# Patient Record
Sex: Male | Born: 1945 | Race: White | Hispanic: No | Marital: Married | Smoking: Former smoker
Health system: Southern US, Community
[De-identification: ages and names within clinical notes are randomized; demographics above are authoritative.]

## PROBLEM LIST (undated history)

## (undated) DIAGNOSIS — I779 Disorder of arteries and arterioles, unspecified: Secondary | ICD-10-CM

## (undated) DIAGNOSIS — I2510 Atherosclerotic heart disease of native coronary artery without angina pectoris: Secondary | ICD-10-CM

## (undated) DIAGNOSIS — I700 Atherosclerosis of aorta: Secondary | ICD-10-CM

## (undated) DIAGNOSIS — R739 Hyperglycemia, unspecified: Secondary | ICD-10-CM

## (undated) DIAGNOSIS — K429 Umbilical hernia without obstruction or gangrene: Secondary | ICD-10-CM

## (undated) DIAGNOSIS — I639 Cerebral infarction, unspecified: Secondary | ICD-10-CM

## (undated) DIAGNOSIS — Z7739 Contact with and (suspected) exposure to other war theater: Secondary | ICD-10-CM

## (undated) DIAGNOSIS — G4730 Sleep apnea, unspecified: Secondary | ICD-10-CM

## (undated) DIAGNOSIS — Z7982 Long term (current) use of aspirin: Secondary | ICD-10-CM

## (undated) DIAGNOSIS — I5189 Other ill-defined heart diseases: Secondary | ICD-10-CM

## (undated) DIAGNOSIS — M1990 Unspecified osteoarthritis, unspecified site: Secondary | ICD-10-CM

## (undated) DIAGNOSIS — I10 Essential (primary) hypertension: Secondary | ICD-10-CM

## (undated) DIAGNOSIS — E039 Hypothyroidism, unspecified: Secondary | ICD-10-CM

## (undated) DIAGNOSIS — I219 Acute myocardial infarction, unspecified: Secondary | ICD-10-CM

## (undated) DIAGNOSIS — E785 Hyperlipidemia, unspecified: Secondary | ICD-10-CM

## (undated) DIAGNOSIS — R7303 Prediabetes: Secondary | ICD-10-CM

## (undated) HISTORY — DX: Atherosclerotic heart disease of native coronary artery without angina pectoris: I25.10

## (undated) HISTORY — DX: Other ill-defined heart diseases: I51.89

## (undated) HISTORY — DX: Morbid (severe) obesity due to excess calories: E66.01

## (undated) HISTORY — PX: ROTATOR CUFF REPAIR: SHX139

---

## 2008-11-17 ENCOUNTER — Ambulatory Visit (HOSPITAL_COMMUNITY): Admission: RE | Admit: 2008-11-17 | Discharge: 2008-11-18 | Payer: Self-pay | Admitting: Orthopedic Surgery

## 2008-12-22 ENCOUNTER — Ambulatory Visit (HOSPITAL_BASED_OUTPATIENT_CLINIC_OR_DEPARTMENT_OTHER): Admission: RE | Admit: 2008-12-22 | Discharge: 2008-12-22 | Payer: Self-pay | Admitting: Orthopedic Surgery

## 2009-10-04 ENCOUNTER — Emergency Department: Payer: Self-pay

## 2009-10-16 ENCOUNTER — Ambulatory Visit: Payer: Self-pay | Admitting: Urology

## 2010-11-27 LAB — POCT HEMOGLOBIN-HEMACUE: Hemoglobin: 14.5 g/dL (ref 13.0–17.0)

## 2010-11-29 LAB — COMPREHENSIVE METABOLIC PANEL
ALT: 50 U/L (ref 0–53)
Alkaline Phosphatase: 130 U/L — ABNORMAL HIGH (ref 39–117)
CO2: 23 mEq/L (ref 19–32)
Glucose, Bld: 80 mg/dL (ref 70–99)
Potassium: 4.4 mEq/L (ref 3.5–5.1)
Sodium: 136 mEq/L (ref 135–145)
Total Protein: 6.8 g/dL (ref 6.0–8.3)

## 2010-11-29 LAB — URINALYSIS, ROUTINE W REFLEX MICROSCOPIC
Bilirubin Urine: NEGATIVE
Glucose, UA: NEGATIVE mg/dL
Hgb urine dipstick: NEGATIVE
Specific Gravity, Urine: 1.025 (ref 1.005–1.030)
Urobilinogen, UA: 1 mg/dL (ref 0.0–1.0)
pH: 6 (ref 5.0–8.0)

## 2010-11-29 LAB — DIFFERENTIAL
Basophils Relative: 0 % (ref 0–1)
Eosinophils Absolute: 0.1 10*3/uL (ref 0.0–0.7)
Eosinophils Relative: 2 % (ref 0–5)
Monocytes Relative: 7 % (ref 3–12)
Neutrophils Relative %: 55 % (ref 43–77)

## 2010-11-29 LAB — URINE CULTURE
Colony Count: NO GROWTH
Culture: NO GROWTH

## 2010-11-29 LAB — CBC
Hemoglobin: 14.1 g/dL (ref 13.0–17.0)
RBC: 4.81 MIL/uL (ref 4.22–5.81)

## 2010-11-29 LAB — PROTIME-INR: INR: 0.9 (ref 0.00–1.49)

## 2011-01-01 NOTE — Op Note (Signed)
NAME:  Alan Stewart, Alan Stewart NO.:  [id number]   MEDICAL RECORD NO.:  [id number]          PATIENT TYPE:  OIB   LOCATION:  5003                         FACILITY:  [facility]   PHYSICIAN:  Rodney A. 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  He has significant pain and  discomfort about this shoulder.  Because of persistent pain and  discomfort, an MRI of the shoulder was done, and this shows a full-  thickness tear at distal insertion of the supraspinatus.  Tear measures  1.6 cm on the sagittal image.  There is some retraction of the bursal  fibers in this area.  There is severe diffuse tendinopathy of the  supraspinatus area in the area of the tear.  There is severe arthritis  of the [facility] joint with unfavorable acromial anatomy, and he is now  admitted for surgical repair.  Questions answered and encouraged  preoperatively.  Complications discussed.   JUSTIFICATION FOR OUTPATIENT SURGERY:  Minimal morbidity.   PREOPERATIVE DIAGNOSES:  Full-thickness tear supraspinatus right  shoulder; impingement syndrome; severe arthritis right acromioclavicular  joint.   POSTOPERATIVE DIAGNOSES:  Full-thickness tear supraspinatus right  shoulder; impingement syndrome; severe arthritis right acromioclavicular  joint.   OPERATION:  Right shoulder arthroscopy, open acromioplasty and open  excisional repair of torn rotator cuff on the right; distal clavicle  resection.   SURGEON:  Lenard Galloway. Chaney Malling, MD   ASSISTANT:  Laural Benes. Su Hilt, [place]   ANESTHESIA:  General.   PROCEDURE:  The patient was placed on the operating table in the supine  position.  After satisfactory general anesthesia, the patient was placed  semi-sitting position.  The right shoulder and upper extremity  was  prepped and draped out in the usual manner.  An arthroscope was  introduced through the posterior portal and an intraoperative portal was  made.  Very careful examination of the shoulder was undertaken.  The  glenoid and the humeral head showed normal articular cartilage.  The  biceps was normal.  The labrum was intact.  There was marked fraying and  tearing of the undersurface of the rotator cuff just behind the biceps.  Once this was accomplished, the arthroscope was then removed.   It was felt that an open repair was indicated and necessary in this  patient.  A saber-cut incision was made over the anterolateral aspect of  the shoulder.  The skin edges were retracted.  A self-retaining  retractor was put in place.  The scope was placed back in the  glenohumeral joint, and an 18-gauge spinal needle was put through the  area of the tear from outside to in after the skin was opened.  This was  certainly the degenerated torn portion of the rotator cuff  _then_________ the arthroscope was removed.  An elliptical excision of  that portion cup was then made with a 15 blade.  Marked degenerative  gentle tissue was seen and this was totally excised.  A side-to-side  repair was then accomplished very nicely with 0 Ethibond sutures.  A  very tight stable repair was achieved.  Just prior to the repair, an  acromioplasty was done, and this was fairly generous as this had  inferior bone spurs and was tearing up the cuff.  There was a fair  amount of tendinopathy in that area.  Severe degenerative changes were  noted about the [facility] joint, and dissection was carried proximally and the  [facility] joint identified.  The capsule was opened, and a small saw was used  to resect the distal 7-8 mm of the distal clavicle.  The ligaments in  the surface of the clavicle were preserved, and there was good stability  of the distal clavicle.  Bleeders were coagulated throughout the  procedure.  Wounds were irrigated  multiple times with saline.  The  deltoid had been split longitudinally along its fibers and was  reattached with 0 Vicryl and 0 Vicryl was used to close the subcutaneous  tissue.  Stainless steel staples were used to close the skin.  Sterile  dressings were applied.  Marcaine was placed in the wound, and the  patient was returned to recovery room in excellent condition.  Technically, this went extremely well.  I was very pleased with the  surgical construct.   DISPOSITION:  1. The patient will be observed overnight.  2. Discharge in a.m.  3. Usual postop instructions given.  4. To my office next week to be followed up.       Rodney A. Chaney Malling, M.D.  Electronically Signed     RAM/MEDQ  D:  11/17/2008  T:  11/18/2008  Job:  [id number]

## 2011-01-01 NOTE — Op Note (Signed)
NAME:  Alan Stewart, Alan Stewart NO.:  [id number]   MEDICAL RECORD NO.:  [id number]          PATIENT TYPE:  AMB   LOCATION:  DSC                          FACILITY:  [facility]   PHYSICIAN:  Rodney A. Mortenson, M.D.DATE OF BIRTH:  [birth year]   DATE OF PROCEDURE:  DATE OF DISCHARGE:                               OPERATIVE REPORT   JUSTIFICATION:  A 65 year old male who had previous surgery in the right  shoulder, has developed adhesive capsulitis and loss of motion about the  shoulder.  He is now admitted for closed manipulation.  Complication was  discussed.  Questions answered and encouraged.   JUSTIFICATION FOR OUTPATIENT SURGERY:  Minimal morbidity.   PREOPERATIVE DIAGNOSIS:  Postoperative adhesive capsulitis right  shoulder.   POSTOPERATIVE DIAGNOSIS:  Postoperative adhesive capsulitis right  shoulder.   OPERATION:  Closed manipulation right shoulder under general anesthesia.   SURGEON:  Lenard Galloway. Mortenson, MD   PROCEDURE:  The patient was placed on the operating table in the supine  position.  After satisfactory general anesthesia, the scapula was  stabilized.  The shoulder was abducted and externally and internally  rotated with lysis of adhesions.  This was done very successfully.  A  good range of motion was achieved.  The adhesions were not heavy and  dense but they easily lysed.  Marked increased range of motion following  manipulation.  Preoperatively, abduction about 80 degrees, external  rotation about 10 degrees and 15 degrees.  Postmanipulation abduction  about 100 degrees, external rotation 90 degrees, internal rotation 90  degrees.  No complications.  The patient returned to recovery room in  good condition.   DISPOSITION:  1. Start physical therapy tomorrow.  2. Overhead traction at night.  3. Percocet.  4. Usual postop instructions.  5. To my office on Wednesday.      Rodney A. Chaney Malling, M.D.  Electronically Signed     RAM/MEDQ  D:   12/22/2008  T:  12/22/2008  Job:  [id number]

## 2011-06-30 IMAGING — CT CT STONE STUDY
1 of 2 series · 15 of 32 positions shown, 19 images · non-contrast
Comparison: none

REASON FOR EXAM: left flank pain with hematuria
COMMENTS:   LMP: (Male)

PROCEDURE:     CT  - CT ABDOMEN /PELVIS WO (STONE)  - October 04, 2009  [DATE]
RESULT:     CT head and pelvis dated 10/04/2009.
TECHNIQUE: Helical 3 mm sections were obtained from the lung bases through
the pubic symphysis without administration of oral nor intravenous contrast.

[Series 2: stone · axial · 0.85mm/px · z∈[-556,-121]mm · 15 of 159 slices shown, 19 images]
[im 7/159  soft-tissue]
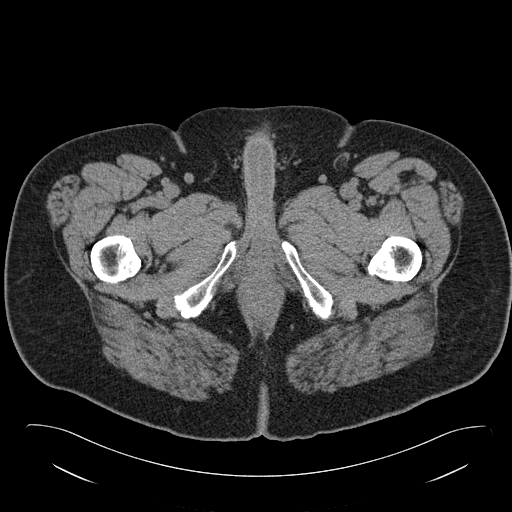
[im 7/159  bone]
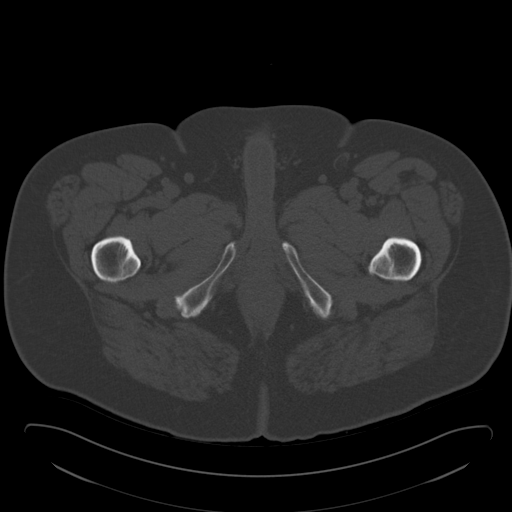
[im 19/159  soft-tissue]
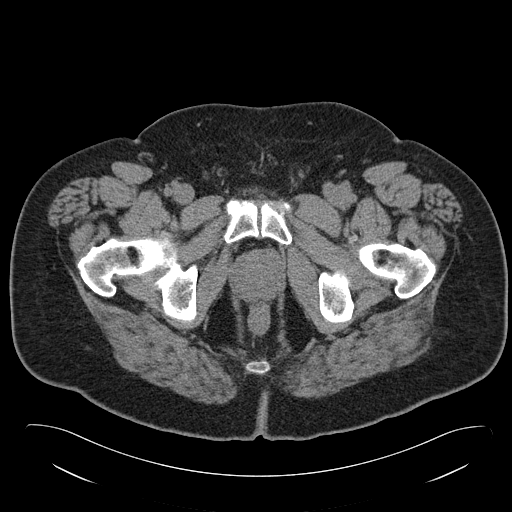
[im 31/159  soft-tissue]
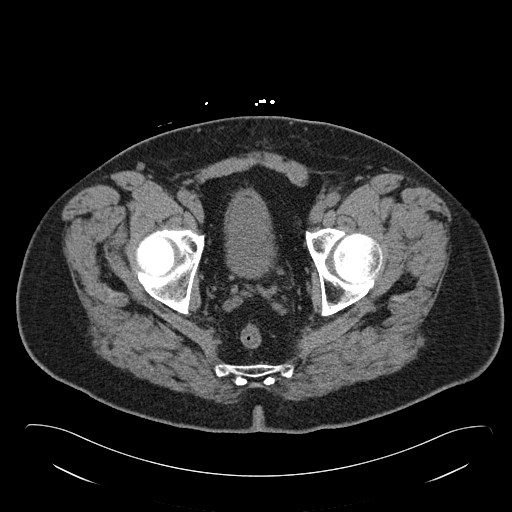
[im 43/159  soft-tissue]
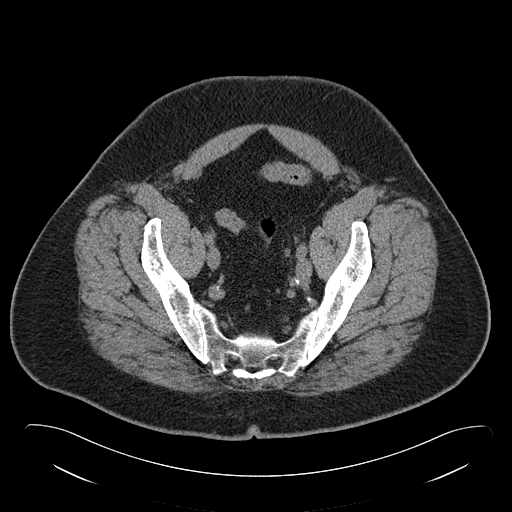
[im 55/159  soft-tissue]
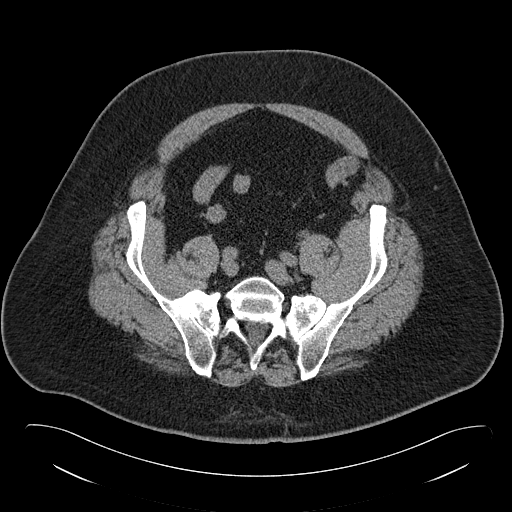
[im 67/159  soft-tissue]
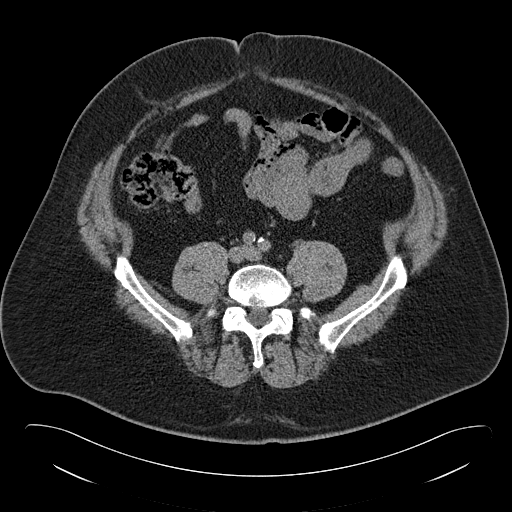
[im 80/159  soft-tissue]
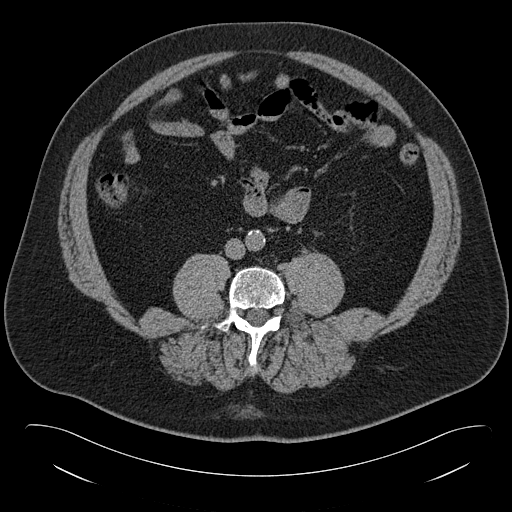
[im 92/159  soft-tissue]
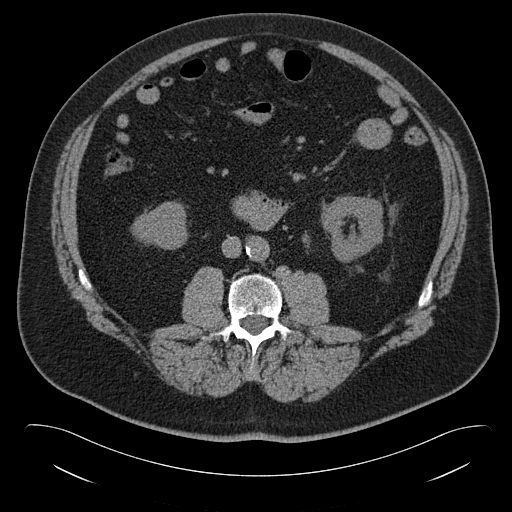
[im 104/159  soft-tissue]
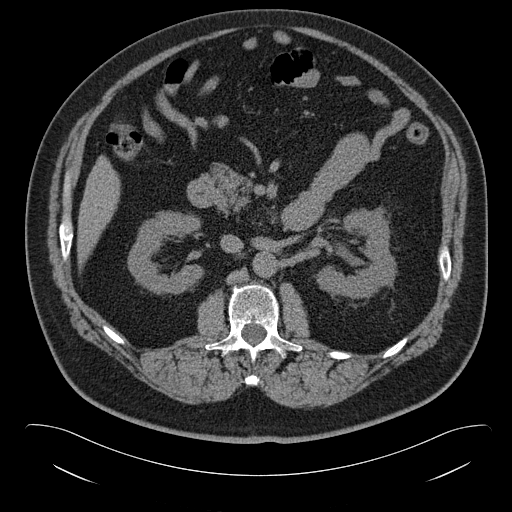
[im 104/159  bone]
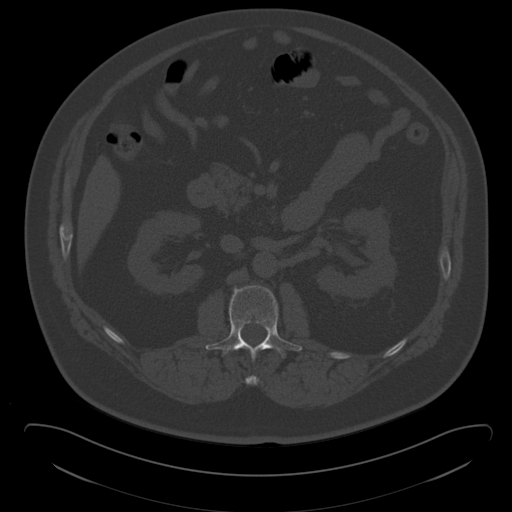
[im 116/159  soft-tissue]
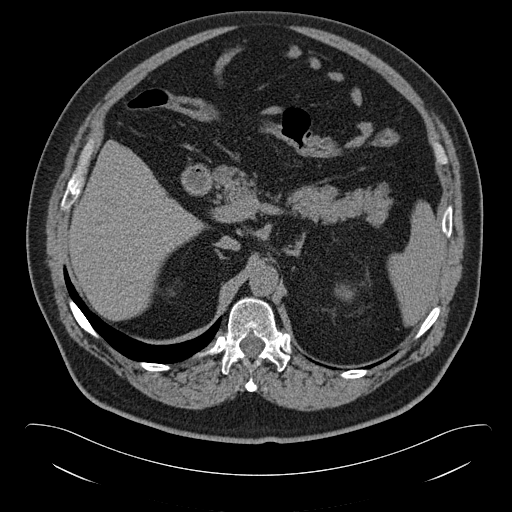
[im 128/159  soft-tissue]
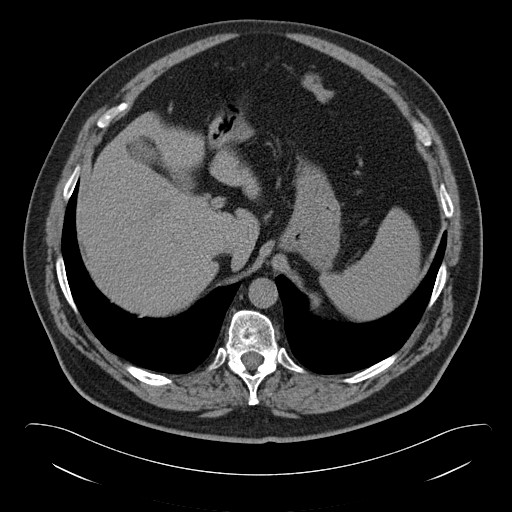
[im 134/159  lung]
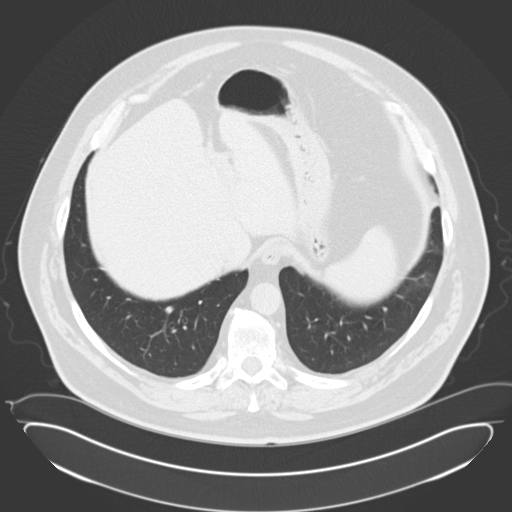
[im 140/159  soft-tissue]
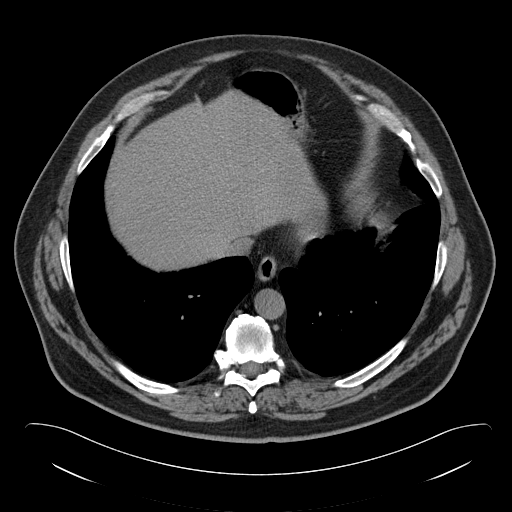
[im 140/159  lung]
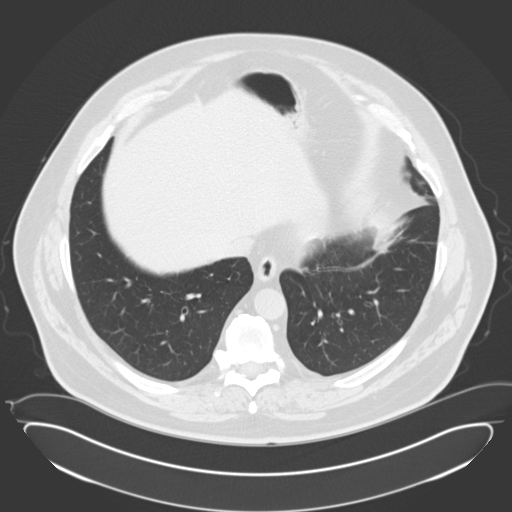
[im 146/159  lung]
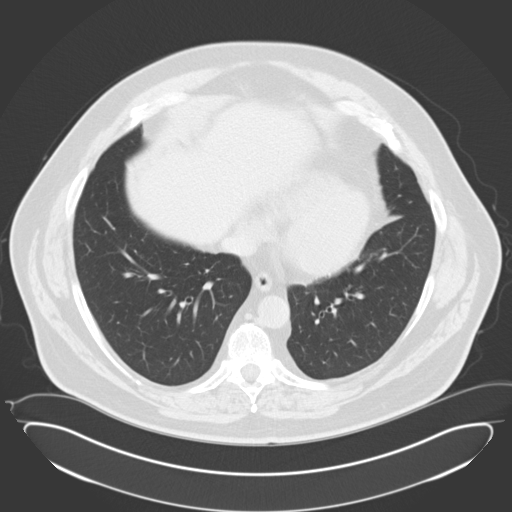
[im 152/159  soft-tissue]
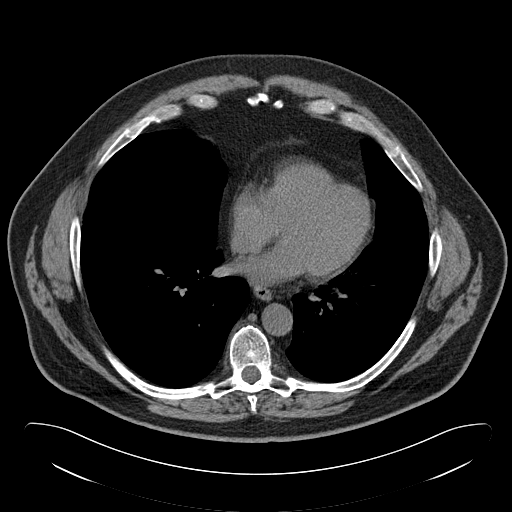
[im 152/159  lung]
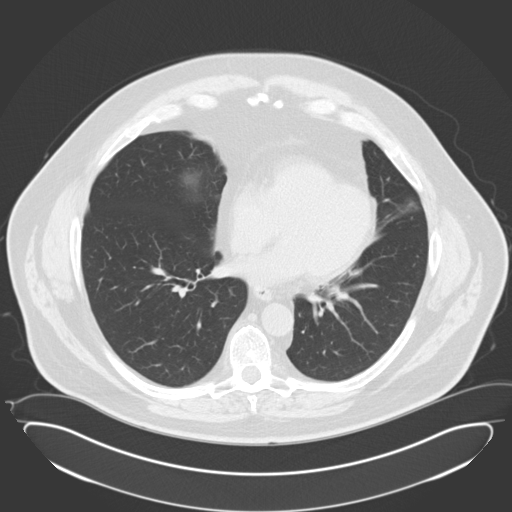

[15 of 32 positions shown; findings below may reference images not displayed]

FINDINGS: Evaluation of the lung bases demonstrates no gross abnormalities.

The left kidney demonstrates mild pelviectasis without evidence of
hydronephrosis. There is mild stranding within the perinephric fat. A
nonobstructing 2.6 mm medullary calculus is identified within the anterior
midpole region. There is mild to moderate prominence of the ureter. Within
the distal left ureter 4.1 mm calculus is appreciated.

Noncontrast evaluation of the liver, spleen, adrenals, pancreas, right
kidney is unremarkable. There is no CT evidence of bowel obstruction,
abdominal or pelvic free fluid, loculated fluid collections, masses or
adenopathy. Within the limitations of a noncontrast CT there is no evidence
of enteritis, diverticulitis, colitis, nor appendicitis. There is no
evidence of abdominal aortic aneurysm.
IMPRESSION: Distal left ureteral calculus with associated mild
obstructive uropathy. There is also evidence of a nonobstructing left
medullary calculus.

## 2015-07-20 DIAGNOSIS — M159 Polyosteoarthritis, unspecified: Secondary | ICD-10-CM | POA: Insufficient documentation

## 2015-07-20 DIAGNOSIS — Z125 Encounter for screening for malignant neoplasm of prostate: Secondary | ICD-10-CM | POA: Diagnosis not present

## 2015-07-20 DIAGNOSIS — G4733 Obstructive sleep apnea (adult) (pediatric): Secondary | ICD-10-CM | POA: Diagnosis not present

## 2015-07-20 DIAGNOSIS — I10 Essential (primary) hypertension: Secondary | ICD-10-CM | POA: Diagnosis not present

## 2015-07-20 DIAGNOSIS — Z0000 Encounter for general adult medical examination without abnormal findings: Secondary | ICD-10-CM | POA: Diagnosis not present

## 2015-07-20 DIAGNOSIS — R739 Hyperglycemia, unspecified: Secondary | ICD-10-CM | POA: Diagnosis not present

## 2015-07-20 DIAGNOSIS — E784 Other hyperlipidemia: Secondary | ICD-10-CM | POA: Diagnosis not present

## 2015-07-20 DIAGNOSIS — Z6841 Body Mass Index (BMI) 40.0 and over, adult: Secondary | ICD-10-CM | POA: Diagnosis not present

## 2015-08-07 DIAGNOSIS — Z1211 Encounter for screening for malignant neoplasm of colon: Secondary | ICD-10-CM | POA: Diagnosis not present

## 2015-09-26 DIAGNOSIS — E7800 Pure hypercholesterolemia, unspecified: Secondary | ICD-10-CM | POA: Diagnosis not present

## 2015-10-02 DIAGNOSIS — R739 Hyperglycemia, unspecified: Secondary | ICD-10-CM | POA: Diagnosis not present

## 2015-10-02 DIAGNOSIS — I10 Essential (primary) hypertension: Secondary | ICD-10-CM | POA: Diagnosis not present

## 2015-10-02 DIAGNOSIS — M159 Polyosteoarthritis, unspecified: Secondary | ICD-10-CM | POA: Diagnosis not present

## 2015-10-02 DIAGNOSIS — E784 Other hyperlipidemia: Secondary | ICD-10-CM | POA: Diagnosis not present

## 2015-10-02 DIAGNOSIS — Z6841 Body Mass Index (BMI) 40.0 and over, adult: Secondary | ICD-10-CM | POA: Diagnosis not present

## 2015-10-02 DIAGNOSIS — G4733 Obstructive sleep apnea (adult) (pediatric): Secondary | ICD-10-CM | POA: Diagnosis not present

## 2015-10-06 ENCOUNTER — Encounter: Payer: Self-pay | Admitting: *Deleted

## 2015-10-09 ENCOUNTER — Encounter: Payer: Self-pay | Admitting: Anesthesiology

## 2015-10-09 ENCOUNTER — Ambulatory Visit: Payer: Commercial Managed Care - HMO | Admitting: Anesthesiology

## 2015-10-09 ENCOUNTER — Encounter
Admission: RE | Disposition: A | Discharge status: Home / Self Care | Payer: Self-pay | Source: Ambulatory Visit | Attending: Gastroenterology

## 2015-10-09 ENCOUNTER — Ambulatory Visit
Admission: RE | Admit: 2015-10-09 | Discharge: 2015-10-09 | Disposition: A | Discharge status: Home / Self Care | Payer: Commercial Managed Care - HMO | Source: Ambulatory Visit | Attending: Gastroenterology | Admitting: Gastroenterology

## 2015-10-09 DIAGNOSIS — Z87891 Personal history of nicotine dependence: Secondary | ICD-10-CM | POA: Diagnosis not present

## 2015-10-09 DIAGNOSIS — I10 Essential (primary) hypertension: Secondary | ICD-10-CM | POA: Insufficient documentation

## 2015-10-09 DIAGNOSIS — M1990 Unspecified osteoarthritis, unspecified site: Secondary | ICD-10-CM | POA: Insufficient documentation

## 2015-10-09 DIAGNOSIS — E785 Hyperlipidemia, unspecified: Secondary | ICD-10-CM | POA: Diagnosis not present

## 2015-10-09 DIAGNOSIS — E119 Type 2 diabetes mellitus without complications: Secondary | ICD-10-CM | POA: Insufficient documentation

## 2015-10-09 DIAGNOSIS — K5730 Diverticulosis of large intestine without perforation or abscess without bleeding: Secondary | ICD-10-CM | POA: Insufficient documentation

## 2015-10-09 DIAGNOSIS — Z6837 Body mass index (BMI) 37.0-37.9, adult: Secondary | ICD-10-CM | POA: Diagnosis not present

## 2015-10-09 DIAGNOSIS — Z1211 Encounter for screening for malignant neoplasm of colon: Secondary | ICD-10-CM | POA: Diagnosis not present

## 2015-10-09 DIAGNOSIS — G4730 Sleep apnea, unspecified: Secondary | ICD-10-CM | POA: Diagnosis not present

## 2015-10-09 DIAGNOSIS — K5790 Diverticulosis of intestine, part unspecified, without perforation or abscess without bleeding: Secondary | ICD-10-CM | POA: Diagnosis not present

## 2015-10-09 HISTORY — DX: Essential (primary) hypertension: I10

## 2015-10-09 HISTORY — DX: Unspecified osteoarthritis, unspecified site: M19.90

## 2015-10-09 HISTORY — PX: COLONOSCOPY: SHX5424

## 2015-10-09 HISTORY — DX: Sleep apnea, unspecified: G47.30

## 2015-10-09 HISTORY — DX: Hyperglycemia, unspecified: R73.9

## 2015-10-09 HISTORY — DX: Hyperlipidemia, unspecified: E78.5

## 2015-10-09 SURGERY — COLONOSCOPY
Anesthesia: General

## 2015-10-09 MED ORDER — SODIUM CHLORIDE 0.9 % IV SOLN
INTRAVENOUS | Status: DC
  Administered 2015-10-09: 1000 mL via INTRAVENOUS

## 2015-10-09 MED ORDER — SODIUM CHLORIDE 0.9 % IV SOLN
INTRAVENOUS | Status: DC
  Administered 2015-10-09: 1000 mL via INTRAVENOUS
  Administered 2015-10-09: 09:00:00 via INTRAVENOUS

## 2015-10-09 MED ORDER — PROPOFOL 500 MG/50ML IV EMUL
INTRAVENOUS | Status: DC | PRN
  Administered 2015-10-09: 100 ug/kg/min via INTRAVENOUS

## 2015-10-09 NOTE — Transfer of Care (Signed)
Immediate Anesthesia Transfer of Care Note  Patient: Alan Stewart  Procedure(s) Performed: Procedure(s): COLONOSCOPY (N/A)  Patient Location: PACU  Anesthesia Type:General  Level of Consciousness: awake, alert  and oriented  Airway & Oxygen Therapy: Patient Spontanous Breathing and Patient connected to nasal cannula oxygen  Post-op Assessment: Report given to RN and Post -op Vital signs reviewed and stable  Post vital signs: Reviewed and stable  Last Vitals:  Filed Vitals:   10/09/15 0757 10/09/15 0853  BP: 137/77 112/68  Pulse: 70 63  Temp: 36.4 C 36 C  Resp: 20 20    Complications: No apparent anesthesia complications

## 2015-10-09 NOTE — Anesthesia Preprocedure Evaluation (Signed)
Anesthesia Evaluation  Patient identified by MRN, date of birth, ID band Patient awake    Reviewed: Allergy & Precautions, H&P , NPO status , Patient's Chart, lab work & pertinent test results, reviewed documented beta blocker date and time   History of Anesthesia Complications Negative for: history of anesthetic complications  Airway Mallampati: III  TM Distance: >3 FB Neck ROM: full    Dental no notable dental hx. (+) Chipped, Poor Dentition   Pulmonary neg shortness of breath, sleep apnea , neg COPD, neg recent URI, former smoker,    Pulmonary exam normal breath sounds clear to auscultation       Cardiovascular Exercise Tolerance: Good hypertension, (-) angina(-) CAD, (-) Past MI, (-) Cardiac Stents and (-) CABG Normal cardiovascular exam(-) dysrhythmias (-) Valvular Problems/Murmurs Rhythm:regular Rate:Normal     Neuro/Psych negative neurological ROS  negative psych ROS   GI/Hepatic negative GI ROS, Neg liver ROS,   Endo/Other  diabetes (borderline)Morbid obesity  Renal/GU negative Renal ROS  negative genitourinary   Musculoskeletal   Abdominal   Peds  Hematology negative hematology ROS (+)   Anesthesia Other Findings Past Medical History:   Hypertension                                                 Osteoarthritis                                               Hyperglycemia                                                Hyperlipidemia                                               Sleep apnea                                                  Reproductive/Obstetrics negative OB ROS                             Anesthesia Physical Anesthesia Plan  ASA: III  Anesthesia Plan: General   Post-op Pain Management:    Induction:   Airway Management Planned:   Additional Equipment:   Intra-op Plan:   Post-operative Plan:   Informed Consent: I have reviewed the patients History  and Physical, chart, labs and discussed the procedure including the risks, benefits and alternatives for the proposed anesthesia with the patient or authorized representative who has indicated his/her understanding and acceptance.   Dental Advisory Given  Plan Discussed with: Anesthesiologist, CRNA and Surgeon  Anesthesia Plan Comments:         Anesthesia Quick Evaluation

## 2015-10-09 NOTE — Op Note (Signed)
[facility] Patient Name: Alan Stewart Procedure Date: 10/09/2015 8:28 AM MRN: [id number] Account #: [id number] Date of Birth: 18-Sep-1945 Admit Type: Outpatient Age: 70 Room: [facility] ENDO ROOM 4 Gender: Male Note Status: Finalized Procedure:            Colonoscopy Indications:          Screening for colorectal malignant neoplasm Providers:            Lupita Dawn. Candace Cruise, MD Referring MD:         Ramonita Lab, MD (Referring MD) Medicines:            Monitored Anesthesia Care Complications:        No immediate complications. Procedure:            Pre-Anesthesia Assessment:                       - Prior to the procedure, a History and Physical was                        performed, and patient medications, allergies and                        sensitivities were reviewed. The patient's tolerance of                        previous anesthesia was reviewed.                       - The risks and benefits of the procedure and the                        sedation options and risks were discussed with the                        patient. All questions were answered and informed                        consent was obtained.                       - After reviewing the risks and benefits, the patient                        was deemed in satisfactory condition to undergo the                        procedure.                       After obtaining informed consent, the colonoscope was                        passed under direct vision. Throughout the procedure,                        the patient's blood pressure, pulse, and oxygen                        saturations were monitored continuously. The  Colonoscope was introduced through the anus and                        advanced to the the cecum, identified by appendiceal                        orifice and ileocecal valve. The colonoscopy was                        performed without difficulty. The patient  tolerated the                        procedure well. The quality of the bowel preparation                        was fair. Findings:      Multiple small and large-mouthed diverticula were found in the sigmoid       colon.      The exam was otherwise without abnormality. Impression:           - Preparation of the colon was fair.                       - Diverticulosis in the sigmoid colon.                       - The examination was otherwise normal.                       - No specimens collected. Recommendation:       - Discharge patient to home.                       - Repeat colonoscopy in 10 years for surveillance.                       - The findings and recommendations were discussed with                        the patient. Procedure Code(s):    --- Professional ---                       [phone], Colonoscopy, flexible; diagnostic, including                        collection of specimen(s) by brushing or washing, when                        performed (separate procedure) Diagnosis Code(s):    --- Professional ---                       Z12.11, Encounter for screening for malignant neoplasm                        of colon                       K57.30, Diverticulosis of large intestine without                        perforation or abscess without bleeding CPT copyright 2016 American  Medical Association. All rights reserved. The codes documented in this report are preliminary and upon coder review may  be revised to meet current compliance requirements. Hulen Luster, MD 10/09/2015 8:51:51 AM This report has been signed electronically. Number of Addenda: 0 Note Initiated On: 10/09/2015 8:28 AM Scope Withdrawal Time: 0 hours 8 minutes 12 seconds  Total Procedure Duration: 0 hours 11 minutes 57 seconds       [facility]

## 2015-10-09 NOTE — H&P (Signed)
    Primary Care Physician:  Adin Hector, MD Primary Gastroenterologist:  Dr. Candace Cruise  Pre-Procedure History & Physical: HPI:  Alan Stewart is a 70 y.o. male is here for an colonoscopy.   Past Medical History  Diagnosis Date  . Hypertension   . Osteoarthritis   . Hyperglycemia   . Hyperlipidemia   . Sleep apnea     Past Surgical History  Procedure Laterality Date  . Rotator cuff repair      Prior to Admission medications   Medication Sig Start Date End Date Taking? Authorizing Provider  atorvastatin (LIPITOR) 10 MG tablet Take 10 mg by mouth daily.   Yes Historical Provider, MD    Allergies as of 08/10/2015  . (Not on File)    History reviewed. No pertinent family history.  Social History   Social History  . Marital Status: Married    Spouse Name: N/A  . Number of Children: N/A  . Years of Education: N/A   Occupational History  . Not on file.   Social History Main Topics  . Smoking status: Former Smoker    Quit date: 07/19/1990  . Smokeless tobacco: Former Systems developer  . Alcohol Use: Yes  . Drug Use: No  . Sexual Activity: Not on file   Other Topics Concern  . Not on file   Social History Narrative    Review of Systems: See HPI, otherwise negative ROS  Physical Exam: BP 137/75 mmHg  Pulse 60  Temp(Src) 96.8 F (36 C) (Tympanic)  Resp 19  Ht 5\' 6"  (1.676 m)  Wt 106.595 kg (235 lb)  BMI 37.95 kg/m2  SpO2 96% General:   Alert,  pleasant and cooperative in NAD Head:  Normocephalic and atraumatic. Neck:  Supple; no masses or thyromegaly. Lungs:  Clear throughout to auscultation.    Heart:  Regular rate and rhythm. Abdomen:  Soft, nontender and nondistended. Normal bowel sounds, without guarding, and without rebound.   Neurologic:  Alert and  oriented x4;  grossly normal neurologically.  Impression/Plan: Alan Stewart is here for an colonoscopy to be performed for screening. Risks, benefits, limitations, and alternatives regarding  colonoscopy  have been reviewed with the patient.  Questions have been answered.  All parties agreeable.   Alan Stewart, Alan Dawn, MD  10/09/2015, 3:27 PM

## 2015-10-09 NOTE — Anesthesia Postprocedure Evaluation (Signed)
Anesthesia Post Note  Patient: Alan Stewart  Procedure(s) Performed: Procedure(s) (LRB): COLONOSCOPY (N/A)  Patient location during evaluation: PACU Anesthesia Type: General Level of consciousness: awake and alert Pain management: pain level controlled Vital Signs Assessment: post-procedure vital signs reviewed and stable Respiratory status: spontaneous breathing, nonlabored ventilation, respiratory function stable and patient connected to nasal cannula oxygen Cardiovascular status: blood pressure returned to baseline and stable Postop Assessment: no signs of nausea or vomiting Anesthetic complications: no    Last Vitals:  Filed Vitals:   10/09/15 0910 10/09/15 0920  BP: 132/74 137/75  Pulse: 57 60  Temp:    Resp: 15 19    Last Pain: There were no vitals filed for this visit.               Martha Clan

## 2015-10-10 ENCOUNTER — Encounter: Payer: Self-pay | Admitting: Gastroenterology

## 2015-10-13 DIAGNOSIS — G4730 Sleep apnea, unspecified: Secondary | ICD-10-CM | POA: Diagnosis not present

## 2015-11-17 DIAGNOSIS — G4733 Obstructive sleep apnea (adult) (pediatric): Secondary | ICD-10-CM | POA: Diagnosis not present

## 2015-12-17 DIAGNOSIS — G4733 Obstructive sleep apnea (adult) (pediatric): Secondary | ICD-10-CM | POA: Diagnosis not present

## 2015-12-27 DIAGNOSIS — G4733 Obstructive sleep apnea (adult) (pediatric): Secondary | ICD-10-CM | POA: Diagnosis not present

## 2015-12-27 DIAGNOSIS — R739 Hyperglycemia, unspecified: Secondary | ICD-10-CM | POA: Diagnosis not present

## 2015-12-27 DIAGNOSIS — I10 Essential (primary) hypertension: Secondary | ICD-10-CM | POA: Diagnosis not present

## 2015-12-27 DIAGNOSIS — Z6841 Body Mass Index (BMI) 40.0 and over, adult: Secondary | ICD-10-CM | POA: Diagnosis not present

## 2016-01-03 DIAGNOSIS — G4733 Obstructive sleep apnea (adult) (pediatric): Secondary | ICD-10-CM | POA: Diagnosis not present

## 2016-01-03 DIAGNOSIS — I10 Essential (primary) hypertension: Secondary | ICD-10-CM | POA: Diagnosis not present

## 2016-01-03 DIAGNOSIS — M159 Polyosteoarthritis, unspecified: Secondary | ICD-10-CM | POA: Diagnosis not present

## 2016-01-03 DIAGNOSIS — E784 Other hyperlipidemia: Secondary | ICD-10-CM | POA: Diagnosis not present

## 2016-01-03 DIAGNOSIS — Z6841 Body Mass Index (BMI) 40.0 and over, adult: Secondary | ICD-10-CM | POA: Diagnosis not present

## 2016-01-03 DIAGNOSIS — R739 Hyperglycemia, unspecified: Secondary | ICD-10-CM | POA: Diagnosis not present

## 2016-01-17 DIAGNOSIS — G4733 Obstructive sleep apnea (adult) (pediatric): Secondary | ICD-10-CM | POA: Diagnosis not present

## 2016-02-16 DIAGNOSIS — G4733 Obstructive sleep apnea (adult) (pediatric): Secondary | ICD-10-CM | POA: Diagnosis not present

## 2016-02-26 DIAGNOSIS — I10 Essential (primary) hypertension: Secondary | ICD-10-CM | POA: Diagnosis not present

## 2016-02-26 DIAGNOSIS — L259 Unspecified contact dermatitis, unspecified cause: Secondary | ICD-10-CM | POA: Diagnosis not present

## 2016-03-18 DIAGNOSIS — G4733 Obstructive sleep apnea (adult) (pediatric): Secondary | ICD-10-CM | POA: Diagnosis not present

## 2016-03-28 DIAGNOSIS — E784 Other hyperlipidemia: Secondary | ICD-10-CM | POA: Diagnosis not present

## 2016-03-28 DIAGNOSIS — I10 Essential (primary) hypertension: Secondary | ICD-10-CM | POA: Diagnosis not present

## 2016-03-28 DIAGNOSIS — R739 Hyperglycemia, unspecified: Secondary | ICD-10-CM | POA: Diagnosis not present

## 2016-04-04 DIAGNOSIS — Z125 Encounter for screening for malignant neoplasm of prostate: Secondary | ICD-10-CM | POA: Diagnosis not present

## 2016-04-04 DIAGNOSIS — E784 Other hyperlipidemia: Secondary | ICD-10-CM | POA: Diagnosis not present

## 2016-04-04 DIAGNOSIS — Z6841 Body Mass Index (BMI) 40.0 and over, adult: Secondary | ICD-10-CM | POA: Diagnosis not present

## 2016-04-04 DIAGNOSIS — I10 Essential (primary) hypertension: Secondary | ICD-10-CM | POA: Diagnosis not present

## 2016-04-04 DIAGNOSIS — G4733 Obstructive sleep apnea (adult) (pediatric): Secondary | ICD-10-CM | POA: Diagnosis not present

## 2016-04-04 DIAGNOSIS — R739 Hyperglycemia, unspecified: Secondary | ICD-10-CM | POA: Diagnosis not present

## 2016-04-04 DIAGNOSIS — M159 Polyosteoarthritis, unspecified: Secondary | ICD-10-CM | POA: Diagnosis not present

## 2016-04-18 DIAGNOSIS — G4733 Obstructive sleep apnea (adult) (pediatric): Secondary | ICD-10-CM | POA: Diagnosis not present

## 2016-05-18 DIAGNOSIS — G4733 Obstructive sleep apnea (adult) (pediatric): Secondary | ICD-10-CM | POA: Diagnosis not present

## 2016-06-18 DIAGNOSIS — G4733 Obstructive sleep apnea (adult) (pediatric): Secondary | ICD-10-CM | POA: Diagnosis not present

## 2016-07-18 DIAGNOSIS — G4733 Obstructive sleep apnea (adult) (pediatric): Secondary | ICD-10-CM | POA: Diagnosis not present

## 2016-07-31 DIAGNOSIS — R739 Hyperglycemia, unspecified: Secondary | ICD-10-CM | POA: Diagnosis not present

## 2016-07-31 DIAGNOSIS — M159 Polyosteoarthritis, unspecified: Secondary | ICD-10-CM | POA: Diagnosis not present

## 2016-07-31 DIAGNOSIS — Z125 Encounter for screening for malignant neoplasm of prostate: Secondary | ICD-10-CM | POA: Diagnosis not present

## 2016-07-31 DIAGNOSIS — G4733 Obstructive sleep apnea (adult) (pediatric): Secondary | ICD-10-CM | POA: Diagnosis not present

## 2016-07-31 DIAGNOSIS — I10 Essential (primary) hypertension: Secondary | ICD-10-CM | POA: Diagnosis not present

## 2016-08-07 DIAGNOSIS — G4733 Obstructive sleep apnea (adult) (pediatric): Secondary | ICD-10-CM | POA: Diagnosis not present

## 2016-08-07 DIAGNOSIS — Z0000 Encounter for general adult medical examination without abnormal findings: Secondary | ICD-10-CM | POA: Diagnosis not present

## 2016-08-07 DIAGNOSIS — I10 Essential (primary) hypertension: Secondary | ICD-10-CM | POA: Diagnosis not present

## 2016-08-07 DIAGNOSIS — R739 Hyperglycemia, unspecified: Secondary | ICD-10-CM | POA: Diagnosis not present

## 2016-08-07 DIAGNOSIS — M159 Polyosteoarthritis, unspecified: Secondary | ICD-10-CM | POA: Diagnosis not present

## 2016-08-07 DIAGNOSIS — E784 Other hyperlipidemia: Secondary | ICD-10-CM | POA: Diagnosis not present

## 2016-08-07 DIAGNOSIS — Z6841 Body Mass Index (BMI) 40.0 and over, adult: Secondary | ICD-10-CM | POA: Diagnosis not present

## 2016-08-18 DIAGNOSIS — G4733 Obstructive sleep apnea (adult) (pediatric): Secondary | ICD-10-CM | POA: Diagnosis not present

## 2016-09-18 DIAGNOSIS — G4733 Obstructive sleep apnea (adult) (pediatric): Secondary | ICD-10-CM | POA: Diagnosis not present

## 2016-10-16 DIAGNOSIS — G4733 Obstructive sleep apnea (adult) (pediatric): Secondary | ICD-10-CM | POA: Diagnosis not present

## 2016-11-16 DIAGNOSIS — G4733 Obstructive sleep apnea (adult) (pediatric): Secondary | ICD-10-CM | POA: Diagnosis not present

## 2016-12-16 DIAGNOSIS — G4733 Obstructive sleep apnea (adult) (pediatric): Secondary | ICD-10-CM | POA: Diagnosis not present

## 2017-01-30 DIAGNOSIS — I10 Essential (primary) hypertension: Secondary | ICD-10-CM | POA: Diagnosis not present

## 2017-01-30 DIAGNOSIS — G4733 Obstructive sleep apnea (adult) (pediatric): Secondary | ICD-10-CM | POA: Diagnosis not present

## 2017-01-30 DIAGNOSIS — R739 Hyperglycemia, unspecified: Secondary | ICD-10-CM | POA: Diagnosis not present

## 2017-01-30 DIAGNOSIS — Z6841 Body Mass Index (BMI) 40.0 and over, adult: Secondary | ICD-10-CM | POA: Diagnosis not present

## 2017-02-06 DIAGNOSIS — M159 Polyosteoarthritis, unspecified: Secondary | ICD-10-CM | POA: Diagnosis not present

## 2017-02-06 DIAGNOSIS — E784 Other hyperlipidemia: Secondary | ICD-10-CM | POA: Diagnosis not present

## 2017-02-06 DIAGNOSIS — R946 Abnormal results of thyroid function studies: Secondary | ICD-10-CM | POA: Diagnosis not present

## 2017-02-06 DIAGNOSIS — G4733 Obstructive sleep apnea (adult) (pediatric): Secondary | ICD-10-CM | POA: Diagnosis not present

## 2017-02-06 DIAGNOSIS — I10 Essential (primary) hypertension: Secondary | ICD-10-CM | POA: Diagnosis not present

## 2017-02-06 DIAGNOSIS — Z6841 Body Mass Index (BMI) 40.0 and over, adult: Secondary | ICD-10-CM | POA: Diagnosis not present

## 2017-02-06 DIAGNOSIS — R739 Hyperglycemia, unspecified: Secondary | ICD-10-CM | POA: Diagnosis not present

## 2017-02-06 DIAGNOSIS — L989 Disorder of the skin and subcutaneous tissue, unspecified: Secondary | ICD-10-CM | POA: Diagnosis not present

## 2017-03-05 DIAGNOSIS — I10 Essential (primary) hypertension: Secondary | ICD-10-CM | POA: Diagnosis not present

## 2017-03-05 DIAGNOSIS — R946 Abnormal results of thyroid function studies: Secondary | ICD-10-CM | POA: Diagnosis not present

## 2017-03-11 DIAGNOSIS — E784 Other hyperlipidemia: Secondary | ICD-10-CM | POA: Diagnosis not present

## 2017-03-11 DIAGNOSIS — Z6841 Body Mass Index (BMI) 40.0 and over, adult: Secondary | ICD-10-CM | POA: Diagnosis not present

## 2017-03-11 DIAGNOSIS — R739 Hyperglycemia, unspecified: Secondary | ICD-10-CM | POA: Diagnosis not present

## 2017-03-11 DIAGNOSIS — I10 Essential (primary) hypertension: Secondary | ICD-10-CM | POA: Diagnosis not present

## 2017-03-11 DIAGNOSIS — Z125 Encounter for screening for malignant neoplasm of prostate: Secondary | ICD-10-CM | POA: Diagnosis not present

## 2017-03-11 DIAGNOSIS — G4733 Obstructive sleep apnea (adult) (pediatric): Secondary | ICD-10-CM | POA: Diagnosis not present

## 2017-03-11 DIAGNOSIS — M159 Polyosteoarthritis, unspecified: Secondary | ICD-10-CM | POA: Diagnosis not present

## 2017-05-29 ENCOUNTER — Emergency Department
Admission: EM | Admit: 2017-05-29 | Discharge: 2017-05-29 | Disposition: A | Discharge status: Home / Self Care | Payer: Medicare HMO | Attending: Emergency Medicine | Admitting: Emergency Medicine

## 2017-05-29 ENCOUNTER — Encounter: Payer: Self-pay | Admitting: Medical Oncology

## 2017-05-29 DIAGNOSIS — Z87891 Personal history of nicotine dependence: Secondary | ICD-10-CM | POA: Diagnosis not present

## 2017-05-29 DIAGNOSIS — E860 Dehydration: Secondary | ICD-10-CM | POA: Diagnosis not present

## 2017-05-29 DIAGNOSIS — Z79899 Other long term (current) drug therapy: Secondary | ICD-10-CM | POA: Diagnosis not present

## 2017-05-29 DIAGNOSIS — K1379 Other lesions of oral mucosa: Secondary | ICD-10-CM | POA: Diagnosis not present

## 2017-05-29 DIAGNOSIS — I10 Essential (primary) hypertension: Secondary | ICD-10-CM | POA: Insufficient documentation

## 2017-05-29 DIAGNOSIS — R55 Syncope and collapse: Secondary | ICD-10-CM | POA: Insufficient documentation

## 2017-05-29 LAB — CBC WITH DIFFERENTIAL/PLATELET
BASOS ABS: 0 10*3/uL (ref 0–0.1)
Basophils Relative: 0 %
Eosinophils Absolute: 0 10*3/uL (ref 0–0.7)
Eosinophils Relative: 1 %
HEMATOCRIT: 42.7 % (ref 40.0–52.0)
Hemoglobin: 14.7 g/dL (ref 13.0–18.0)
LYMPHS PCT: 13 %
Lymphs Abs: 1 10*3/uL (ref 1.0–3.6)
MCH: 29.2 pg (ref 26.0–34.0)
MCHC: 34.4 g/dL (ref 32.0–36.0)
MCV: 84.8 fL (ref 80.0–100.0)
Monocytes Absolute: 0.7 10*3/uL (ref 0.2–1.0)
Monocytes Relative: 9 %
NEUTROS ABS: 6.1 10*3/uL (ref 1.4–6.5)
Neutrophils Relative %: 77 %
Platelets: 241 10*3/uL (ref 150–440)
RBC: 5.03 MIL/uL (ref 4.40–5.90)
RDW: 14.5 % (ref 11.5–14.5)
WBC: 7.9 10*3/uL (ref 3.8–10.6)

## 2017-05-29 LAB — BASIC METABOLIC PANEL
ANION GAP: 11 (ref 5–15)
BUN: 15 mg/dL (ref 6–20)
CO2: 24 mmol/L (ref 22–32)
Calcium: 8.9 mg/dL (ref 8.9–10.3)
Chloride: 101 mmol/L (ref 101–111)
Creatinine, Ser: 1.11 mg/dL (ref 0.61–1.24)
GFR calc Af Amer: >60 mL/min (ref >60)
GLUCOSE: 124 mg/dL — AB (ref 65–99)
POTASSIUM: 3.5 mmol/L (ref 3.5–5.1)
Sodium: 136 mmol/L (ref 135–145)

## 2017-05-29 LAB — URINALYSIS, COMPLETE (UACMP) WITH MICROSCOPIC
BACTERIA UA: NONE SEEN
Bilirubin Urine: NEGATIVE
GLUCOSE, UA: NEGATIVE mg/dL
Hgb urine dipstick: NEGATIVE
KETONES UR: 20 mg/dL — AB
LEUKOCYTES UA: NEGATIVE
NITRITE: NEGATIVE
PH: 5 (ref 5.0–8.0)
PROTEIN: 30 mg/dL — AB
Specific Gravity, Urine: 1.02 (ref 1.005–1.030)
Squamous Epithelial / LPF: NONE SEEN

## 2017-05-29 LAB — TROPONIN I: Troponin I: <0.03 ng/mL (ref <0.03)

## 2017-05-29 MED ORDER — SODIUM CHLORIDE 0.9 % IV BOLUS (SEPSIS)
1000.0000 mL | Freq: Once | INTRAVENOUS | Status: AC
  Administered 2017-05-29: 1000 mL via INTRAVENOUS

## 2017-05-29 NOTE — ED Provider Notes (Addendum)
[facility] Emergency Department Provider Note  ____________________________________________   I have reviewed the triage vital signs and the nursing notes.   HISTORY  Chief Complaint Near Syncope    HPI Alan Stewart is a 71 y.o. male presents today complaining offeeling lightheaded. He was at the doctor's office because he is being treated with antibiotics for a dental infection, he has not had any solid foods last couple days that hurts to chew but he has been having breath. Decreased by mouth in general, he actually states that he is doing better today than he was in terms of his dental issues, he has not yet seen a dentist. He denies any fever, swelling under his tongue or difficulty swallowing or talking. However, this morning, at his doctor's office he was feeling lightheaded and he feels completely fine now. No chest pain or shortness of breath no nausea no vomiting no fever no chills, no focal neurologic deficit and no complaints at this time he states that he just did not have anything to eat or drink this morning and felt lightheaded     Past Medical History:  Diagnosis Date  . Hyperglycemia   . Hyperlipidemia   . Hypertension   . Osteoarthritis   . Sleep apnea     There are no active problems to display for this patient.   Past Surgical History:  Procedure Laterality Date  . COLONOSCOPY N/A 10/09/2015   Procedure: COLONOSCOPY;  Surgeon: Hulen Luster, MD;  Location: [facility] ENDOSCOPY;  Service: Gastroenterology;  Laterality: N/A;  . ROTATOR CUFF REPAIR      Prior to Admission medications   Medication Sig Start Date End Date Taking? Authorizing Provider  amoxicillin (AMOXIL) 875 MG tablet Take 875 mg by mouth 2 (two) times daily. 05/28/17 06/07/17 Yes [provider]  hydrochlorothiazide (HYDRODIURIL) 12.5 MG tablet Take 12.5 mg by mouth daily. 03/08/17  Yes [provider]  traMADol (ULTRAM) 50 MG tablet Take 50 mg by mouth  every 6 (six) hours as needed. 05/28/17  Yes [provider]  atorvastatin (LIPITOR) 10 MG tablet Take 10 mg by mouth daily.    [provider]    Allergies Patient has no known allergies.  No family history on file.  Social History Social History  Substance Use Topics  . Smoking status: Former Smoker    Quit date: 07/19/1990  . Smokeless tobacco: Former Systems developer  . Alcohol use Yes    Review of Systems Constitutional: No fever/chills Eyes: No visual changes. ENT: No sore throat. No stiff neck no neck pain Cardiovascular: Denies chest pain. Respiratory: Denies shortness of breath. Gastrointestinal:   no vomiting.  No diarrhea.  No constipation. Genitourinary: Negative for dysuria. Musculoskeletal: Negative lower extremity swelling Skin: Negative for rash. Neurological: Negative for severe headaches, focal weakness or numbness.   ____________________________________________   PHYSICAL EXAM:  VITAL SIGNS: ED Triage Vitals [05/29/17 0927]  Enc Vitals Group     BP 100/60     Pulse Rate 75     Resp 19     Temp 97.6 F (36.4 C)     Temp Source Oral     SpO2 96 %     Weight 240 lb (108.9 kg)     Height 5\' 6"  (1.676 m)     Head Circumference      Peak Flow      Pain Score 0     Pain Loc      Pain Edu?  Excl. in [place]?     Constitutional: Alert and oriented. Well appearing and in no acute distress.laughing and joking in the room Eyes: Conjunctivae are normal Head: Atraumatic HEENT: No congestion/rhinnorhea. Mucous membranes are moist.  Oropharynx non-erythematous there is mild tenderness to palpation around a clearly decayed right lower first molar, there is no abscess noted at this time there is no swelling under the tongue to suggest Ludwig's angina there is no oropharyngeal swelling there is no hoarse voice or stridor Neck:   Nontender with no meningismus, no masses, no stridor Cardiovascular: Normal rate, regular rhythm. Grossly normal heart  sounds.  Good peripheral circulation. Respiratory: Normal respiratory effort.  No retractions. Lungs CTAB. Abdominal: Soft and nontender. No distention. No guarding no rebound Back:  There is no focal tenderness or step off.  there is no midline tenderness there are no lesions noted. there is no CVA tenderness Musculoskeletal: No lower extremity tenderness, no upper extremity tenderness. No joint effusions, no DVT signs strong distal pulses no edema Neurologic:  Normal speech and language. No gross focal neurologic deficits are appreciated.  Skin:  Skin is warm, dry and intact. No rash noted. Psychiatric: Mood and affect are normal. Speech and behavior are normal.  ____________________________________________   LABS (all labs ordered are listed, but only abnormal results are displayed)  Labs Reviewed  BASIC METABOLIC PANEL  CBC WITH DIFFERENTIAL/PLATELET  TROPONIN I    Pertinent labs  results that were available during my care of the patient were reviewed by me and considered in my medical decision making (see chart for details). ____________________________________________  EKG  I personally interpreted any EKGs ordered by me or triage sinus rhythm rate 67 bpm no acute ST elevation or depression nonspecific ST flattening ____________________________________________  RADIOLOGY  Pertinent labs & imaging results that were available during my care of the patient were reviewed by me and considered in my medical decision making (see chart for details). If possible, patient and/or family made aware of any abnormal findings. ____________________________________________    PROCEDURES  Procedure(s) performed: None  Procedures  Critical Care performed: None  ____________________________________________   INITIAL IMPRESSION / ASSESSMENT AND PLAN / ED COURSE  Pertinent labs & imaging results that were available during my care of the patient were reviewed by me and considered in my  medical decision making (see chart for details).  very well-appearing male with lightheadedness after decreased by mouth from a dental issue. Nothing to suggest significant dental problem requiring emergent care for me, we will refer him to dentistry however we will give him IV fluid as he states his by mouth intake was somewhat low over the last few days and reassess.    ____________________________________________   FINAL CLINICAL IMPRESSION(S) / ED DIAGNOSES  Final diagnoses:  None      This chart was dictated using voice recognition software.  Despite best efforts to proofread,  errors can occur which can change meaning.      Schuyler Amor, MD 05/29/17 1025    Schuyler Amor, MD 05/29/17 [phone]

## 2017-05-29 NOTE — ED Notes (Signed)
PT skin PWD, NAD, VSS. No longer dizzy when standing or changing positions. Pt pushed out to POV in Wheelchair.

## 2017-05-29 NOTE — ED Triage Notes (Signed)
Pt was seen by Dr Jens Som this am for dental abscess, pt was in office and began to feel lightheaded. States that he got diaphoretic. Pt reports he has not ate this am but when arrived to triage feels better. Denies pain.

## 2017-08-07 DIAGNOSIS — M159 Polyosteoarthritis, unspecified: Secondary | ICD-10-CM | POA: Diagnosis not present

## 2017-08-07 DIAGNOSIS — Z6841 Body Mass Index (BMI) 40.0 and over, adult: Secondary | ICD-10-CM | POA: Diagnosis not present

## 2017-08-07 DIAGNOSIS — E7849 Other hyperlipidemia: Secondary | ICD-10-CM | POA: Diagnosis not present

## 2017-08-07 DIAGNOSIS — R739 Hyperglycemia, unspecified: Secondary | ICD-10-CM | POA: Diagnosis not present

## 2017-08-07 DIAGNOSIS — Z125 Encounter for screening for malignant neoplasm of prostate: Secondary | ICD-10-CM | POA: Diagnosis not present

## 2017-08-07 DIAGNOSIS — G4733 Obstructive sleep apnea (adult) (pediatric): Secondary | ICD-10-CM | POA: Diagnosis not present

## 2017-08-07 DIAGNOSIS — I10 Essential (primary) hypertension: Secondary | ICD-10-CM | POA: Diagnosis not present

## 2017-08-13 DIAGNOSIS — Z6841 Body Mass Index (BMI) 40.0 and over, adult: Secondary | ICD-10-CM | POA: Diagnosis not present

## 2017-08-13 DIAGNOSIS — E039 Hypothyroidism, unspecified: Secondary | ICD-10-CM | POA: Diagnosis not present

## 2017-08-13 DIAGNOSIS — G4733 Obstructive sleep apnea (adult) (pediatric): Secondary | ICD-10-CM | POA: Diagnosis not present

## 2017-08-13 DIAGNOSIS — R739 Hyperglycemia, unspecified: Secondary | ICD-10-CM | POA: Diagnosis not present

## 2017-08-13 DIAGNOSIS — M159 Polyosteoarthritis, unspecified: Secondary | ICD-10-CM | POA: Diagnosis not present

## 2017-08-13 DIAGNOSIS — I10 Essential (primary) hypertension: Secondary | ICD-10-CM | POA: Diagnosis not present

## 2017-08-13 DIAGNOSIS — Z0000 Encounter for general adult medical examination without abnormal findings: Secondary | ICD-10-CM | POA: Diagnosis not present

## 2017-08-13 DIAGNOSIS — E7849 Other hyperlipidemia: Secondary | ICD-10-CM | POA: Diagnosis not present

## 2017-09-02 DIAGNOSIS — E039 Hypothyroidism, unspecified: Secondary | ICD-10-CM | POA: Diagnosis not present

## 2017-11-06 DIAGNOSIS — R739 Hyperglycemia, unspecified: Secondary | ICD-10-CM | POA: Diagnosis not present

## 2017-11-06 DIAGNOSIS — E7849 Other hyperlipidemia: Secondary | ICD-10-CM | POA: Diagnosis not present

## 2017-11-06 DIAGNOSIS — E039 Hypothyroidism, unspecified: Secondary | ICD-10-CM | POA: Diagnosis not present

## 2017-11-17 DIAGNOSIS — Z6841 Body Mass Index (BMI) 40.0 and over, adult: Secondary | ICD-10-CM | POA: Diagnosis not present

## 2017-11-17 DIAGNOSIS — R739 Hyperglycemia, unspecified: Secondary | ICD-10-CM | POA: Diagnosis not present

## 2017-11-17 DIAGNOSIS — G4733 Obstructive sleep apnea (adult) (pediatric): Secondary | ICD-10-CM | POA: Diagnosis not present

## 2017-11-17 DIAGNOSIS — I10 Essential (primary) hypertension: Secondary | ICD-10-CM | POA: Diagnosis not present

## 2017-11-17 DIAGNOSIS — E7849 Other hyperlipidemia: Secondary | ICD-10-CM | POA: Diagnosis not present

## 2018-02-10 DIAGNOSIS — E7849 Other hyperlipidemia: Secondary | ICD-10-CM | POA: Diagnosis not present

## 2018-02-10 DIAGNOSIS — G4733 Obstructive sleep apnea (adult) (pediatric): Secondary | ICD-10-CM | POA: Diagnosis not present

## 2018-02-10 DIAGNOSIS — I10 Essential (primary) hypertension: Secondary | ICD-10-CM | POA: Diagnosis not present

## 2018-02-10 DIAGNOSIS — R739 Hyperglycemia, unspecified: Secondary | ICD-10-CM | POA: Diagnosis not present

## 2018-02-16 DIAGNOSIS — G4733 Obstructive sleep apnea (adult) (pediatric): Secondary | ICD-10-CM | POA: Diagnosis not present

## 2018-02-16 DIAGNOSIS — Z125 Encounter for screening for malignant neoplasm of prostate: Secondary | ICD-10-CM | POA: Diagnosis not present

## 2018-02-16 DIAGNOSIS — R739 Hyperglycemia, unspecified: Secondary | ICD-10-CM | POA: Diagnosis not present

## 2018-02-16 DIAGNOSIS — I10 Essential (primary) hypertension: Secondary | ICD-10-CM | POA: Diagnosis not present

## 2018-02-16 DIAGNOSIS — E7849 Other hyperlipidemia: Secondary | ICD-10-CM | POA: Diagnosis not present

## 2018-02-16 DIAGNOSIS — M159 Polyosteoarthritis, unspecified: Secondary | ICD-10-CM | POA: Diagnosis not present

## 2018-02-16 DIAGNOSIS — Z6841 Body Mass Index (BMI) 40.0 and over, adult: Secondary | ICD-10-CM | POA: Diagnosis not present

## 2018-04-15 DIAGNOSIS — M545 Low back pain: Secondary | ICD-10-CM | POA: Diagnosis not present

## 2018-10-12 DIAGNOSIS — G4733 Obstructive sleep apnea (adult) (pediatric): Secondary | ICD-10-CM | POA: Diagnosis not present

## 2018-10-12 DIAGNOSIS — R739 Hyperglycemia, unspecified: Secondary | ICD-10-CM | POA: Diagnosis not present

## 2018-10-12 DIAGNOSIS — I10 Essential (primary) hypertension: Secondary | ICD-10-CM | POA: Diagnosis not present

## 2018-10-12 DIAGNOSIS — M159 Polyosteoarthritis, unspecified: Secondary | ICD-10-CM | POA: Diagnosis not present

## 2018-10-12 DIAGNOSIS — E7849 Other hyperlipidemia: Secondary | ICD-10-CM | POA: Diagnosis not present

## 2018-10-12 DIAGNOSIS — Z125 Encounter for screening for malignant neoplasm of prostate: Secondary | ICD-10-CM | POA: Diagnosis not present

## 2018-10-20 DIAGNOSIS — R972 Elevated prostate specific antigen [PSA]: Secondary | ICD-10-CM | POA: Diagnosis not present

## 2018-10-20 DIAGNOSIS — Z6841 Body Mass Index (BMI) 40.0 and over, adult: Secondary | ICD-10-CM | POA: Diagnosis not present

## 2018-10-20 DIAGNOSIS — E039 Hypothyroidism, unspecified: Secondary | ICD-10-CM | POA: Diagnosis not present

## 2018-10-20 DIAGNOSIS — G4733 Obstructive sleep apnea (adult) (pediatric): Secondary | ICD-10-CM | POA: Diagnosis not present

## 2018-10-20 DIAGNOSIS — Z0000 Encounter for general adult medical examination without abnormal findings: Secondary | ICD-10-CM | POA: Diagnosis not present

## 2018-10-20 DIAGNOSIS — E7849 Other hyperlipidemia: Secondary | ICD-10-CM | POA: Diagnosis not present

## 2018-10-20 DIAGNOSIS — I10 Essential (primary) hypertension: Secondary | ICD-10-CM | POA: Diagnosis not present

## 2018-10-20 DIAGNOSIS — R739 Hyperglycemia, unspecified: Secondary | ICD-10-CM | POA: Diagnosis not present

## 2018-10-20 DIAGNOSIS — M159 Polyosteoarthritis, unspecified: Secondary | ICD-10-CM | POA: Diagnosis not present

## 2019-01-21 DIAGNOSIS — R972 Elevated prostate specific antigen [PSA]: Secondary | ICD-10-CM | POA: Diagnosis not present

## 2019-01-21 DIAGNOSIS — R739 Hyperglycemia, unspecified: Secondary | ICD-10-CM | POA: Diagnosis not present

## 2019-01-21 DIAGNOSIS — E7849 Other hyperlipidemia: Secondary | ICD-10-CM | POA: Diagnosis not present

## 2019-01-21 DIAGNOSIS — E039 Hypothyroidism, unspecified: Secondary | ICD-10-CM | POA: Diagnosis not present

## 2019-01-26 DIAGNOSIS — I10 Essential (primary) hypertension: Secondary | ICD-10-CM | POA: Diagnosis not present

## 2019-01-26 DIAGNOSIS — M159 Polyosteoarthritis, unspecified: Secondary | ICD-10-CM | POA: Diagnosis not present

## 2019-01-26 DIAGNOSIS — Z6841 Body Mass Index (BMI) 40.0 and over, adult: Secondary | ICD-10-CM | POA: Diagnosis not present

## 2019-01-26 DIAGNOSIS — E7849 Other hyperlipidemia: Secondary | ICD-10-CM | POA: Diagnosis not present

## 2019-01-26 DIAGNOSIS — R739 Hyperglycemia, unspecified: Secondary | ICD-10-CM | POA: Diagnosis not present

## 2019-01-26 DIAGNOSIS — G4733 Obstructive sleep apnea (adult) (pediatric): Secondary | ICD-10-CM | POA: Diagnosis not present

## 2019-04-22 DIAGNOSIS — R739 Hyperglycemia, unspecified: Secondary | ICD-10-CM | POA: Diagnosis not present

## 2019-04-22 DIAGNOSIS — E7849 Other hyperlipidemia: Secondary | ICD-10-CM | POA: Diagnosis not present

## 2019-04-29 DIAGNOSIS — E7849 Other hyperlipidemia: Secondary | ICD-10-CM | POA: Diagnosis not present

## 2019-04-29 DIAGNOSIS — E039 Hypothyroidism, unspecified: Secondary | ICD-10-CM | POA: Diagnosis not present

## 2019-04-29 DIAGNOSIS — M159 Polyosteoarthritis, unspecified: Secondary | ICD-10-CM | POA: Diagnosis not present

## 2019-04-29 DIAGNOSIS — G4733 Obstructive sleep apnea (adult) (pediatric): Secondary | ICD-10-CM | POA: Diagnosis not present

## 2019-04-29 DIAGNOSIS — Z6841 Body Mass Index (BMI) 40.0 and over, adult: Secondary | ICD-10-CM | POA: Diagnosis not present

## 2019-04-29 DIAGNOSIS — Z125 Encounter for screening for malignant neoplasm of prostate: Secondary | ICD-10-CM | POA: Diagnosis not present

## 2019-04-29 DIAGNOSIS — I10 Essential (primary) hypertension: Secondary | ICD-10-CM | POA: Diagnosis not present

## 2019-04-29 DIAGNOSIS — K429 Umbilical hernia without obstruction or gangrene: Secondary | ICD-10-CM | POA: Diagnosis not present

## 2019-05-11 DIAGNOSIS — M6208 Separation of muscle (nontraumatic), other site: Secondary | ICD-10-CM | POA: Diagnosis not present

## 2019-05-11 DIAGNOSIS — K429 Umbilical hernia without obstruction or gangrene: Secondary | ICD-10-CM | POA: Diagnosis not present

## 2019-05-12 DIAGNOSIS — K429 Umbilical hernia without obstruction or gangrene: Secondary | ICD-10-CM | POA: Insufficient documentation

## 2019-09-24 ENCOUNTER — Ambulatory Visit: Payer: Medicare HMO | Attending: Internal Medicine

## 2019-09-24 ENCOUNTER — Other Ambulatory Visit: Payer: Self-pay

## 2019-09-24 DIAGNOSIS — Z23 Encounter for immunization: Secondary | ICD-10-CM

## 2019-09-24 NOTE — Progress Notes (Signed)
   Covid-19 Vaccination Clinic  Name:  Alan Stewart    MRN: [id number] DOB: 01-30-1946  09/24/2019  Mr. Pounds was observed post Covid-19 immunization for 15 minutes without incidence. He was provided with Vaccine Information Sheet and instruction to access the V-Safe system.   [name] was instructed to call 911 with any severe reactions post vaccine: Marland Kitchen Difficulty breathing  . Swelling of your face and throat  . A fast heartbeat  . A bad rash all over your body  . Dizziness and weakness    Immunizations Administered    Name Date Dose VIS Date Route   Moderna COVID-19 Vaccine 09/24/2019  4:10 PM 0.5 mL 07/20/2019 Intramuscular   Manufacturer: Moderna   Lot: [id number]   [id number]

## 2019-10-21 DIAGNOSIS — M159 Polyosteoarthritis, unspecified: Secondary | ICD-10-CM | POA: Diagnosis not present

## 2019-10-21 DIAGNOSIS — Z125 Encounter for screening for malignant neoplasm of prostate: Secondary | ICD-10-CM | POA: Diagnosis not present

## 2019-10-21 DIAGNOSIS — E7849 Other hyperlipidemia: Secondary | ICD-10-CM | POA: Diagnosis not present

## 2019-10-21 DIAGNOSIS — G4733 Obstructive sleep apnea (adult) (pediatric): Secondary | ICD-10-CM | POA: Diagnosis not present

## 2019-10-21 DIAGNOSIS — I10 Essential (primary) hypertension: Secondary | ICD-10-CM | POA: Diagnosis not present

## 2019-10-21 DIAGNOSIS — R739 Hyperglycemia, unspecified: Secondary | ICD-10-CM | POA: Diagnosis not present

## 2019-10-26 ENCOUNTER — Ambulatory Visit: Payer: Medicare HMO | Attending: Internal Medicine

## 2019-10-26 DIAGNOSIS — Z23 Encounter for immunization: Secondary | ICD-10-CM | POA: Insufficient documentation

## 2019-10-26 NOTE — Progress Notes (Signed)
   Covid-19 Vaccination Clinic  Name:  Alan Stewart    MRN: [id number] DOB: Apr 04, 1946  10/26/2019  Alan Stewart was observed post Covid-19 immunization for 15 minutes without incident. He was provided with Vaccine Information Sheet and instruction to access the V-Safe system.   Alan Stewart was instructed to call 911 with any severe reactions post vaccine: Marland Kitchen Difficulty breathing  . Swelling of face and throat  . A fast heartbeat  . A bad rash all over body  . Dizziness and weakness   Immunizations Administered    Name Date Dose VIS Date Route   Moderna COVID-19 Vaccine 10/26/2019  1:03 PM 0.5 mL 07/20/2019 Intramuscular   Manufacturer: Moderna   Lot: [id number]   [id number]

## 2019-10-27 DIAGNOSIS — I10 Essential (primary) hypertension: Secondary | ICD-10-CM | POA: Diagnosis not present

## 2019-10-27 DIAGNOSIS — E039 Hypothyroidism, unspecified: Secondary | ICD-10-CM | POA: Diagnosis not present

## 2019-10-27 DIAGNOSIS — Z0000 Encounter for general adult medical examination without abnormal findings: Secondary | ICD-10-CM | POA: Diagnosis not present

## 2019-10-27 DIAGNOSIS — R739 Hyperglycemia, unspecified: Secondary | ICD-10-CM | POA: Diagnosis not present

## 2019-10-27 DIAGNOSIS — Z6841 Body Mass Index (BMI) 40.0 and over, adult: Secondary | ICD-10-CM | POA: Diagnosis not present

## 2019-10-27 DIAGNOSIS — N400 Enlarged prostate without lower urinary tract symptoms: Secondary | ICD-10-CM | POA: Diagnosis not present

## 2019-10-27 DIAGNOSIS — M159 Polyosteoarthritis, unspecified: Secondary | ICD-10-CM | POA: Diagnosis not present

## 2019-10-27 DIAGNOSIS — E7849 Other hyperlipidemia: Secondary | ICD-10-CM | POA: Diagnosis not present

## 2019-10-27 DIAGNOSIS — K429 Umbilical hernia without obstruction or gangrene: Secondary | ICD-10-CM | POA: Diagnosis not present

## 2020-04-21 DIAGNOSIS — I10 Essential (primary) hypertension: Secondary | ICD-10-CM | POA: Diagnosis not present

## 2020-04-21 DIAGNOSIS — G4733 Obstructive sleep apnea (adult) (pediatric): Secondary | ICD-10-CM | POA: Diagnosis not present

## 2020-04-21 DIAGNOSIS — E7849 Other hyperlipidemia: Secondary | ICD-10-CM | POA: Diagnosis not present

## 2020-04-21 DIAGNOSIS — R739 Hyperglycemia, unspecified: Secondary | ICD-10-CM | POA: Diagnosis not present

## 2020-04-21 DIAGNOSIS — M159 Polyosteoarthritis, unspecified: Secondary | ICD-10-CM | POA: Diagnosis not present

## 2020-04-28 DIAGNOSIS — M1990 Unspecified osteoarthritis, unspecified site: Secondary | ICD-10-CM | POA: Diagnosis not present

## 2020-04-28 DIAGNOSIS — G4730 Sleep apnea, unspecified: Secondary | ICD-10-CM | POA: Diagnosis not present

## 2020-04-28 DIAGNOSIS — Z6841 Body Mass Index (BMI) 40.0 and over, adult: Secondary | ICD-10-CM | POA: Diagnosis not present

## 2020-04-28 DIAGNOSIS — Z125 Encounter for screening for malignant neoplasm of prostate: Secondary | ICD-10-CM | POA: Diagnosis not present

## 2020-04-28 DIAGNOSIS — E785 Hyperlipidemia, unspecified: Secondary | ICD-10-CM | POA: Diagnosis not present

## 2020-04-28 DIAGNOSIS — I10 Essential (primary) hypertension: Secondary | ICD-10-CM | POA: Diagnosis not present

## 2020-04-28 DIAGNOSIS — R739 Hyperglycemia, unspecified: Secondary | ICD-10-CM | POA: Diagnosis not present

## 2020-10-23 DIAGNOSIS — G4733 Obstructive sleep apnea (adult) (pediatric): Secondary | ICD-10-CM | POA: Diagnosis not present

## 2020-10-23 DIAGNOSIS — M159 Polyosteoarthritis, unspecified: Secondary | ICD-10-CM | POA: Diagnosis not present

## 2020-10-23 DIAGNOSIS — E7849 Other hyperlipidemia: Secondary | ICD-10-CM | POA: Diagnosis not present

## 2020-10-23 DIAGNOSIS — R739 Hyperglycemia, unspecified: Secondary | ICD-10-CM | POA: Diagnosis not present

## 2020-10-23 DIAGNOSIS — Z125 Encounter for screening for malignant neoplasm of prostate: Secondary | ICD-10-CM | POA: Diagnosis not present

## 2020-10-30 DIAGNOSIS — I10 Essential (primary) hypertension: Secondary | ICD-10-CM | POA: Diagnosis not present

## 2020-10-30 DIAGNOSIS — L570 Actinic keratosis: Secondary | ICD-10-CM | POA: Diagnosis not present

## 2020-10-30 DIAGNOSIS — M159 Polyosteoarthritis, unspecified: Secondary | ICD-10-CM | POA: Diagnosis not present

## 2020-10-30 DIAGNOSIS — R739 Hyperglycemia, unspecified: Secondary | ICD-10-CM | POA: Diagnosis not present

## 2020-10-30 DIAGNOSIS — Z6841 Body Mass Index (BMI) 40.0 and over, adult: Secondary | ICD-10-CM | POA: Diagnosis not present

## 2020-10-30 DIAGNOSIS — E7849 Other hyperlipidemia: Secondary | ICD-10-CM | POA: Diagnosis not present

## 2020-10-30 DIAGNOSIS — Z0000 Encounter for general adult medical examination without abnormal findings: Secondary | ICD-10-CM | POA: Diagnosis not present

## 2020-10-30 DIAGNOSIS — G4733 Obstructive sleep apnea (adult) (pediatric): Secondary | ICD-10-CM | POA: Diagnosis not present

## 2021-04-26 DIAGNOSIS — G4733 Obstructive sleep apnea (adult) (pediatric): Secondary | ICD-10-CM | POA: Diagnosis not present

## 2021-04-26 DIAGNOSIS — I10 Essential (primary) hypertension: Secondary | ICD-10-CM | POA: Diagnosis not present

## 2021-04-26 DIAGNOSIS — E7849 Other hyperlipidemia: Secondary | ICD-10-CM | POA: Diagnosis not present

## 2021-04-26 DIAGNOSIS — M159 Polyosteoarthritis, unspecified: Secondary | ICD-10-CM | POA: Diagnosis not present

## 2021-04-26 DIAGNOSIS — R739 Hyperglycemia, unspecified: Secondary | ICD-10-CM | POA: Diagnosis not present

## 2021-05-03 DIAGNOSIS — R739 Hyperglycemia, unspecified: Secondary | ICD-10-CM | POA: Diagnosis not present

## 2021-05-03 DIAGNOSIS — Z6841 Body Mass Index (BMI) 40.0 and over, adult: Secondary | ICD-10-CM | POA: Diagnosis not present

## 2021-05-03 DIAGNOSIS — E039 Hypothyroidism, unspecified: Secondary | ICD-10-CM | POA: Diagnosis not present

## 2021-05-03 DIAGNOSIS — Z125 Encounter for screening for malignant neoplasm of prostate: Secondary | ICD-10-CM | POA: Diagnosis not present

## 2021-05-03 DIAGNOSIS — G4733 Obstructive sleep apnea (adult) (pediatric): Secondary | ICD-10-CM | POA: Diagnosis not present

## 2021-05-03 DIAGNOSIS — E785 Hyperlipidemia, unspecified: Secondary | ICD-10-CM | POA: Diagnosis not present

## 2021-05-03 DIAGNOSIS — I10 Essential (primary) hypertension: Secondary | ICD-10-CM | POA: Diagnosis not present

## 2021-05-03 DIAGNOSIS — M159 Polyosteoarthritis, unspecified: Secondary | ICD-10-CM | POA: Diagnosis not present

## 2021-05-28 ENCOUNTER — Other Ambulatory Visit: Payer: Self-pay

## 2021-05-28 ENCOUNTER — Ambulatory Visit: Payer: Medicare HMO | Admitting: Dermatology

## 2021-05-28 DIAGNOSIS — L578 Other skin changes due to chronic exposure to nonionizing radiation: Secondary | ICD-10-CM | POA: Diagnosis not present

## 2021-05-28 DIAGNOSIS — L570 Actinic keratosis: Secondary | ICD-10-CM

## 2021-05-28 DIAGNOSIS — L820 Inflamed seborrheic keratosis: Secondary | ICD-10-CM | POA: Diagnosis not present

## 2021-05-28 MED ORDER — FLUOROURACIL 5 % EX CREA: TOPICAL_CREAM | Freq: Two times a day (BID) | CUTANEOUS | 1 refills | Status: DC

## 2021-05-28 NOTE — Patient Instructions (Addendum)
5-Fluorouracil/Calcipotriene Patient Education   Actinic keratoses are the dry, red scaly spots on the skin caused by sun damage. A portion of these spots can turn into skin cancer with time, and treating them can help prevent development of skin cancer.   Treatment of these spots requires removal of the defective skin cells. There are various ways to remove actinic keratoses, including freezing with liquid nitrogen, treatment with creams, or treatment with a blue light procedure in the office.   5-fluorouracil cream is a topical cream used to treat actinic keratoses. It works by interfering with the growth of abnormal fast-growing skin cells, such as actinic keratoses. These cells peel off and are replaced by healthy ones.   5-fluorouracil/calcipotriene is a combination of the 5-fluorouracil cream with a vitamin D analog cream called calcipotriene. The calcipotriene alone does not treat actinic keratoses. However, when it is combined with 5-fluorouracil, it helps the 5-fluorouracil treat the actinic keratoses much faster so that the same results can be achieved with a much shorter treatment time.  INSTRUCTIONS FOR 5-FLUOROURACIL/CALCIPOTRIENE CREAM:   5-fluorouracil/calcipotriene cream typically only needs to be used for 4-7 days. A thin layer should be applied twice a day to the treatment areas recommended by your physician.   If your physician prescribed you separate tubes of 5-fluourouracil and calcipotriene, apply a thin layer of 5-fluorouracil followed by a thin layer of calcipotriene.   Avoid contact with your eyes, nostrils, and mouth. Do not use 5-fluorouracil/calcipotriene cream on infected or open wounds.   You will develop redness, irritation and some crusting at areas where you have pre-cancer damage/actinic keratoses. IF YOU DEVELOP PAIN, BLEEDING, OR SIGNIFICANT CRUSTING, STOP THE TREATMENT EARLY - you have already gotten a good response and the actinic keratoses should clear up  well.  Wash your hands after applying 5-fluorouracil 5% cream on your skin.   A moisturizer or sunscreen with a minimum SPF 30 should be applied each morning.   Once you have finished the treatment, you can apply a thin layer of Vaseline twice a day to irritated areas to soothe and calm the areas more quickly. If you experience significant discomfort, contact your physician.  For some patients it is necessary to repeat the treatment for best results.  SIDE EFFECTS: When using 5-fluorouracil/calcipotriene cream, you may have mild irritation, such as redness, dryness, swelling, or a mild burning sensation. This usually resolves within 2 weeks. The more actinic keratoses you have, the more redness and inflammation you can expect during treatment. Eye irritation has been reported rarely. If this occurs, please let us know.  If you have any trouble using this cream, please call the office. If you have any other questions about this information, please do not hesitate to ask me before you leave the office.  In 1 month -   Start 5-fluorouracil/calcipotriene cream twice a day for 7 days to affected areas including forehead, temples. Prescription sent to [facility]. Patient provided with contact information for pharmacy and advised the pharmacy will mail the prescription to their home. Patient provided with handout reviewing treatment course and side effects and advised to call or message [place] on MyChart with any concerns.  Cryotherapy Aftercare  Wash gently with soap and water everyday.   Apply Vaseline and Band-Aid daily until healed.   Recommend daily broad spectrum sunscreen SPF 30+ to sun-exposed areas, reapply every 2 hours as needed. Call for new or changing lesions.  Staying in the shade or wearing long sleeves, sun glasses (UVA+UVB protection)  and wide brim hats (4-inch brim around the entire circumference of the hat) are also recommended for sun protection.   If you have any questions  or concerns for your doctor, please call our main line at [phone] and press option 4 to reach your doctor's medical assistant. If no one answers, please leave a voicemail as directed and we will return your call as soon as possible. Messages left after 4 pm will be answered the following business day.   You may also send [place] a message via [place]. We typically respond to MyChart messages within 1-2 business days.  For prescription refills, please ask your pharmacy to contact our office. Our fax number is [phone].  If you have an urgent issue when the clinic is closed that cannot wait until the next business day, you can page your doctor at the number below.    Please note that while we do our best to be available for urgent issues outside of office hours, we are not available 24/7.   If you have an urgent issue and are unable to reach [place], you may choose to seek medical care at your doctor's office, retail clinic, urgent care center, or emergency room.  If you have a medical emergency, please immediately call 911 or go to the emergency department.  Pager Numbers  - Dr. Nehemiah Massed: [phone]  - Dr. Laurence Ferrari: [phone]  - Dr. Nicole Kindred: [phone]  In the event of inclement weather, please call our main line at [phone] for an update on the status of any delays or closures.  Dermatology Medication Tips: Please keep the boxes that topical medications come in in order to help keep track of the instructions about where and how to use these. Pharmacies typically print the medication instructions only on the boxes and not directly on the medication tubes.   If your medication is too expensive, please contact our office at [phone] option 4 or send [place] a message through [place].   We are unable to tell what your co-pay for medications will be in advance as this is different depending on your insurance coverage. However, we may be able to find a substitute medication at lower cost  or fill out paperwork to get insurance to cover a needed medication.   If a prior authorization is required to get your medication covered by your insurance company, please allow [place] 1-2 business days to complete this process.  Drug prices often vary depending on where the prescription is filled and some pharmacies may offer cheaper prices.  The website www.goodrx.com contains coupons for medications through different pharmacies. The prices here do not account for what the cost may be with help from insurance (it may be cheaper with your insurance), but the website can give you the price if you did not use any insurance.  - You can print the associated coupon and take it with your prescription to the pharmacy.  - You may also stop by our office during regular business hours and pick up a GoodRx coupon card.  - If you need your prescription sent electronically to a different pharmacy, notify our office through [facility] or by phone at [phone] option 4.

## 2021-05-28 NOTE — Progress Notes (Signed)
New Patient Visit  Subjective  Alan Stewart is a 75 y.o. male who presents for the following: Skin Problem (New patient here today for spots at arms and hands. Spots at arms scaly, none sore or bleeding. Patient does not have a hx of skin cancer. ).  The following portions of the chart were reviewed this encounter and updated as appropriate:   Tobacco  Allergies  Meds  Problems  Med Hx  Surg Hx  Fam Hx     Review of Systems:  No other skin or systemic complaints except as noted in HPI or Assessment and Plan.  Objective  Well appearing patient in no apparent distress; mood and affect are within normal limits.  A focused examination was performed including face, arms and hands. Relevant physical exam findings are noted in the Assessment and Plan.  arms and hands x 17 (17) Erythematous keratotic or waxy stuck-on papule or plaque.   face x 16 (16) Erythematous thin papules/macules with gritty scale.    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 As directed x 7 days to forehead and temples  Actinic Damage - Severe, confluent actinic changes with pre-cancerous actinic keratoses  - Severe, chronic, not at goal, secondary to cumulative UV radiation exposure over time - diffuse scaly erythematous macules and papules with underlying dyspigmentation - Discussed Prescription "Field Treatment" for Severe, Chronic Confluent Actinic Changes with Pre-Cancerous Actinic Keratoses Field treatment involves treatment of an entire area of skin that has confluent Actinic Changes (Sun/ Ultraviolet light damage) and PreCancerous Actinic Keratoses by method of PhotoDynamic Therapy (PDT) and/or prescription Topical Chemotherapy agents such as 5-fluorouracil, 5-fluorouracil/calcipotriene, and/or imiquimod.  The purpose is to decrease the number of clinically evident and subclinical PreCancerous lesions to prevent progression to development of skin cancer by chemically destroying early precancer changes that may or may not be visible.  It has been shown to reduce the risk of developing skin cancer in the treated area. As a result of treatment, redness, scaling, crusting, and open sores may occur during treatment course. One or more than one of these methods may be used and may have to be used several times to control, suppress and eliminate the PreCancerous changes. Discussed treatment course, expected reaction, and possible side effects. - Recommend daily broad spectrum sunscreen SPF 30+ to sun-exposed areas, reapply every 2 hours as needed.  - Staying in the shade or wearing long sleeves, sun glasses (UVA+UVB protection) and  wide brim hats (4-inch brim around the entire circumference of the hat) are also recommended. - Call for new or changing lesions.  .Start 5-fluorouracil/calcipotriene cream twice a day for 7 days to affected areas including  forehead, temples. Prescription sent to [facility]. Patient provided with contact information for pharmacy and advised the pharmacy will mail the prescription to their home. Patient provided with handout reviewing treatment course and side effects and advised to call or message [place] on MyChart with any concerns.  Return for AK follow up 3-4 months.  Graciella Belton, RMA, am acting as scribe for Sarina Ser, MD . Documentation: I have reviewed the above documentation for accuracy and completeness, and I agree with the above.  Sarina Ser, MD

## 2021-05-29 ENCOUNTER — Encounter: Payer: Self-pay | Admitting: Dermatology

## 2021-06-01 DIAGNOSIS — E039 Hypothyroidism, unspecified: Secondary | ICD-10-CM | POA: Diagnosis not present

## 2021-09-03 ENCOUNTER — Ambulatory Visit: Payer: Medicare HMO | Admitting: Dermatology

## 2021-10-20 ENCOUNTER — Other Ambulatory Visit: Payer: Self-pay

## 2021-10-20 ENCOUNTER — Inpatient Hospital Stay
Admission: EM | Admit: 2021-10-20 | Discharge: 2021-10-22 | DRG: 247 | Disposition: A | Discharge status: Home / Self Care | Payer: Medicare HMO | Source: Ambulatory Visit | Attending: Internal Medicine | Admitting: Internal Medicine

## 2021-10-20 ENCOUNTER — Ambulatory Visit: Admission: EM | Admit: 2021-10-20 | Discharge: 2021-10-20 | Payer: Medicare HMO

## 2021-10-20 ENCOUNTER — Emergency Department: Payer: Medicare HMO

## 2021-10-20 ENCOUNTER — Encounter: Payer: Self-pay | Admitting: Emergency Medicine

## 2021-10-20 DIAGNOSIS — E669 Obesity, unspecified: Secondary | ICD-10-CM

## 2021-10-20 DIAGNOSIS — I214 Non-ST elevation (NSTEMI) myocardial infarction: Principal | ICD-10-CM

## 2021-10-20 DIAGNOSIS — E782 Mixed hyperlipidemia: Secondary | ICD-10-CM | POA: Diagnosis not present

## 2021-10-20 DIAGNOSIS — Z87891 Personal history of nicotine dependence: Secondary | ICD-10-CM | POA: Diagnosis not present

## 2021-10-20 DIAGNOSIS — Z6838 Body mass index (BMI) 38.0-38.9, adult: Secondary | ICD-10-CM

## 2021-10-20 DIAGNOSIS — M1990 Unspecified osteoarthritis, unspecified site: Secondary | ICD-10-CM | POA: Diagnosis present

## 2021-10-20 DIAGNOSIS — R9431 Abnormal electrocardiogram [ECG] [EKG]: Secondary | ICD-10-CM

## 2021-10-20 DIAGNOSIS — I2510 Atherosclerotic heart disease of native coronary artery without angina pectoris: Secondary | ICD-10-CM | POA: Diagnosis not present

## 2021-10-20 DIAGNOSIS — R7303 Prediabetes: Secondary | ICD-10-CM

## 2021-10-20 DIAGNOSIS — I10 Essential (primary) hypertension: Secondary | ICD-10-CM

## 2021-10-20 DIAGNOSIS — G4733 Obstructive sleep apnea (adult) (pediatric): Secondary | ICD-10-CM | POA: Diagnosis not present

## 2021-10-20 DIAGNOSIS — Z79899 Other long term (current) drug therapy: Secondary | ICD-10-CM | POA: Diagnosis not present

## 2021-10-20 DIAGNOSIS — Z8249 Family history of ischemic heart disease and other diseases of the circulatory system: Secondary | ICD-10-CM | POA: Diagnosis not present

## 2021-10-20 DIAGNOSIS — E039 Hypothyroidism, unspecified: Secondary | ICD-10-CM | POA: Diagnosis not present

## 2021-10-20 DIAGNOSIS — G4730 Sleep apnea, unspecified: Secondary | ICD-10-CM

## 2021-10-20 DIAGNOSIS — E785 Hyperlipidemia, unspecified: Secondary | ICD-10-CM | POA: Diagnosis not present

## 2021-10-20 DIAGNOSIS — Z955 Presence of coronary angioplasty implant and graft: Secondary | ICD-10-CM

## 2021-10-20 DIAGNOSIS — R079 Chest pain, unspecified: Secondary | ICD-10-CM

## 2021-10-20 DIAGNOSIS — Z79890 Hormone replacement therapy (postmenopausal): Secondary | ICD-10-CM | POA: Diagnosis not present

## 2021-10-20 DIAGNOSIS — Z9861 Coronary angioplasty status: Secondary | ICD-10-CM

## 2021-10-20 DIAGNOSIS — Z20822 Contact with and (suspected) exposure to covid-19: Secondary | ICD-10-CM | POA: Diagnosis not present

## 2021-10-20 HISTORY — DX: Non-ST elevation (NSTEMI) myocardial infarction: I21.4

## 2021-10-20 HISTORY — DX: Presence of coronary angioplasty implant and graft: Z95.5

## 2021-10-20 LAB — TROPONIN I (HIGH SENSITIVITY)
Troponin I (High Sensitivity): 1497 ng/L (ref <18)
Troponin I (High Sensitivity): 1615 ng/L (ref <18)
Troponin I (High Sensitivity): 2680 ng/L (ref <18)
Troponin I (High Sensitivity): 4253 ng/L (ref <18)

## 2021-10-20 LAB — COMPREHENSIVE METABOLIC PANEL
ALT: 19 U/L (ref 0–44)
AST: 34 U/L (ref 15–41)
Albumin: 3.7 g/dL (ref 3.5–5.0)
Alkaline Phosphatase: 79 U/L (ref 38–126)
Anion gap: 10 (ref 5–15)
BUN: 12 mg/dL (ref 8–23)
CO2: 23 mmol/L (ref 22–32)
Calcium: 9.4 mg/dL (ref 8.9–10.3)
Chloride: 106 mmol/L (ref 98–111)
Creatinine, Ser: 0.98 mg/dL (ref 0.61–1.24)
GFR, Estimated: >60 mL/min (ref >60)
Glucose, Bld: 141 mg/dL — ABNORMAL HIGH (ref 70–99)
Potassium: 4.1 mmol/L (ref 3.5–5.1)
Sodium: 139 mmol/L (ref 135–145)
Total Bilirubin: 0.7 mg/dL (ref 0.3–1.2)
Total Protein: 7.2 g/dL (ref 6.5–8.1)

## 2021-10-20 LAB — RESP PANEL BY RT-PCR (FLU A&B, COVID) ARPGX2
Influenza A by PCR: NEGATIVE
Influenza B by PCR: NEGATIVE
SARS Coronavirus 2 by RT PCR: NEGATIVE

## 2021-10-20 LAB — CBC
HCT: 47.9 % (ref 39.0–52.0)
Hemoglobin: 15.8 g/dL (ref 13.0–17.0)
MCH: 29.2 pg (ref 26.0–34.0)
MCHC: 33 g/dL (ref 30.0–36.0)
MCV: 88.4 fL (ref 80.0–100.0)
Platelets: 257 10*3/uL (ref 150–400)
RBC: 5.42 MIL/uL (ref 4.22–5.81)
RDW: 13.7 % (ref 11.5–15.5)
WBC: 6.2 10*3/uL (ref 4.0–10.5)
nRBC: 0 % (ref 0.0–0.2)

## 2021-10-20 LAB — LIPID PANEL
Cholesterol: 180 mg/dL (ref 0–200)
HDL: 34 mg/dL — ABNORMAL LOW (ref >40)
LDL Cholesterol: 112 mg/dL — ABNORMAL HIGH (ref 0–99)
Total CHOL/HDL Ratio: 5.3 RATIO
Triglycerides: 171 mg/dL — ABNORMAL HIGH (ref <150)
VLDL: 34 mg/dL (ref 0–40)

## 2021-10-20 LAB — HEPARIN LEVEL (UNFRACTIONATED): Heparin Unfractionated: 0.3 IU/mL (ref 0.30–0.70)

## 2021-10-20 LAB — PROTIME-INR
INR: 1 (ref 0.8–1.2)
Prothrombin Time: 13.1 seconds (ref 11.4–15.2)

## 2021-10-20 LAB — TSH: TSH: 2.044 u[IU]/mL (ref 0.350–4.500)

## 2021-10-20 LAB — APTT: aPTT: 27 seconds (ref 24–36)

## 2021-10-20 MED ORDER — ACETAMINOPHEN 325 MG PO TABS: 650.0000 mg | ORAL_TABLET | ORAL | Status: DC | PRN

## 2021-10-20 MED ORDER — HEPARIN (PORCINE) 25000 UT/250ML-% IV SOLN
1250.0000 [IU]/h | INTRAVENOUS | Status: DC
Start: 2021-10-20 — End: 2021-10-22
  Administered 2021-10-20 – 2021-10-22 (×3): 1250 [IU]/h via INTRAVENOUS
  Filled 2021-10-20 (×3): qty 250

## 2021-10-20 MED ORDER — NITROGLYCERIN 0.4 MG SL SUBL: 0.4000 mg | SUBLINGUAL_TABLET | SUBLINGUAL | Status: DC | PRN

## 2021-10-20 MED ORDER — ASPIRIN EC 81 MG PO TBEC
81.0000 mg | DELAYED_RELEASE_TABLET | Freq: Every day | ORAL | Status: DC
  Administered 2021-10-21: 09:00:00 81 mg via ORAL
  Filled 2021-10-20: qty 1

## 2021-10-20 MED ORDER — ATORVASTATIN CALCIUM 80 MG PO TABS
80.0000 mg | ORAL_TABLET | Freq: Every day | ORAL | Status: DC
  Administered 2021-10-21 – 2021-10-22 (×2): 80 mg via ORAL
  Filled 2021-10-20 (×2): qty 1

## 2021-10-20 MED ORDER — SODIUM CHLORIDE 0.9% FLUSH
3.0000 mL | Freq: Two times a day (BID) | INTRAVENOUS | Status: DC
  Administered 2021-10-20 – 2021-10-21 (×3): 3 mL via INTRAVENOUS

## 2021-10-20 MED ORDER — CARVEDILOL 6.25 MG PO TABS
6.2500 mg | ORAL_TABLET | Freq: Two times a day (BID) | ORAL | Status: DC
  Administered 2021-10-20 – 2021-10-21 (×3): 6.25 mg via ORAL
  Filled 2021-10-20 (×3): qty 1

## 2021-10-20 MED ORDER — ONDANSETRON HCL 4 MG/2ML IJ SOLN: 4.0000 mg | Freq: Four times a day (QID) | INTRAMUSCULAR | Status: DC | PRN

## 2021-10-20 MED ORDER — NITROGLYCERIN 0.4 MG SL SUBL
0.4000 mg | SUBLINGUAL_TABLET | SUBLINGUAL | Status: DC | PRN
  Administered 2021-10-20 (×2): 0.4 mg via SUBLINGUAL
  Filled 2021-10-20: qty 1

## 2021-10-20 MED ORDER — ASPIRIN 81 MG PO CHEW
324.0000 mg | CHEWABLE_TABLET | Freq: Once | ORAL | Status: AC
  Administered 2021-10-20: 324 mg via ORAL
  Filled 2021-10-20: qty 4

## 2021-10-20 MED ORDER — HEPARIN BOLUS VIA INFUSION
4000.0000 [IU] | Freq: Once | INTRAVENOUS | Status: AC
  Administered 2021-10-20: 4000 [IU] via INTRAVENOUS
  Filled 2021-10-20: qty 4000

## 2021-10-20 NOTE — ED Notes (Signed)
Hospitalist at bedside 

## 2021-10-20 NOTE — Plan of Care (Signed)

## 2021-10-20 NOTE — Assessment & Plan Note (Addendum)
-   clinically consistent with NSTEMI. Presented with SS chest pain, diaphoresis, belching. Multiple risk factors including obesity, HTN, HLD, family history ?- initial troponin 1497; appears to have peaked at 4,253.  ?- EKG shows ~ 1 mm ST depression in V3-V6.  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 Dist RCA lesion is 30% stenosed. Mid Cx lesion is 99% stenosed ?- continue asa and brilinta at discharge; minimum 1 year per cardiology ?- continue coreg ?- continue lipitor 80 mg daily  ?

## 2021-10-20 NOTE — ED Notes (Signed)
Card PA at [facility]. Wife at [facility]. Pt alert, NAD, calm, interactive. CP resolved after 2 ntg SL.  ?

## 2021-10-20 NOTE — Hospital Course (Addendum)
Mr. Chillemi is a 76 yo male with PMH obesity, HTN, HLD, hypothyroidism, OSA who presented to the ER with intermittent substernal chest pain radiating to his left arm.  He endorsed symptoms have been intermittent over the past 1 week.  He states approximately 4 episodes lasting approximately 5 minutes at a time.  Stopping to rest does help and precipitating factors do include some physical exertion such as cutting the grass however he also states symptoms occurred at rest as well.  He endorsed associated diaphoresis and belching but denied any nausea or vomiting.  He denies any prior history of similar.  ?Last night around 12:30 AM he could not sleep due to having chest pain and finally presented to the hospital this morning for further evaluation. ?He does endorse a family history of heart disease but could not elaborate too much into this. ?He no longer uses CPAP for his history of OSA. ?He quit smoking at 76 years old and quit drinking in 1984.  He denied any illicit drug use. ? ?Workup in the ER was notable for troponin 1,497 initially. EKG showed 1 mm ST depression in V3 - V6.  ?He was loaded with aspirin 324 mg and started on a heparin drip. Cardiology was also consulted on admission. ? ?He was taken for left heart cath on 10/22/2021.  1 DES placed to the proximal RCA/mid RCA which was 95% stenosed and 1 DES placed to the mid Cx which was 99% stenosed.  ?He was continued on aspirin and Brilinta at discharge.  1 year minimum DAPT recommended.  He will follow-up outpatient with cardiology as well. ?

## 2021-10-20 NOTE — Assessment & Plan Note (Addendum)
-   Follow-up lipid panel: LDL 112 ?- Increase home statin to high intensity ?

## 2021-10-20 NOTE — Consult Note (Addendum)
Cardiology Consultation:   Patient ID: GORJE IYER MRN: [id number]; DOB: Sep 26, 1945  Admit date: 10/20/2021 Date of Consult: 10/20/2021  PCP:  Adin Hector, MD   [facility] Providers Cardiologist:  New  Patient Profile:   Alan Stewart is a 76 y.o. male with a hx of HTN, HLD, prediabetes, obesity, OSA not on CPAP who is being seen 10/20/2021 for the evaluation of NSETMI at the request of Dr. Sabino Gasser.  History of Present Illness:   Mr. Duerson with no prior cardiac history. No significant family cardiac history. Remote smoking history. No alcohol or drug use. He  goes golfing as activity. He has OSA but stopped using CPAP many years ago.   He presented to the ER 10/20/2021 for chest pain, he was sent from an Urgent care. He reports three episodes of chest pain throughout the last week. Last night chest pain was worse, pain started while sitting watching TV.  It was constant, waxing and waning throughout the night. He was unable to go to sleep. Pain was substernal, described as a tightness.Reported  associated clammy feeling. No SOB, nausea, vomiting. No recent LLE, orthopnea, pnd.   In the ER BP 137/87, pulse 69, afebrile, 96%O2. Edema noted one exam. Potassium 4.1, sodium 139, Scr0.98, BUN 12, WBC 6.2, Hgb 15.8. EKG showed NSR with inferolateral ST/T wave changes. HS trop came back at 1,400 and he was started on IV heparin and admitted. Patient had persistent chest pain which resolved with NTGx2.    Past Medical History:  Diagnosis Date   Hyperglycemia    Hyperlipidemia    Hypertension    Osteoarthritis    Sleep apnea     Past Surgical History:  Procedure Laterality Date   COLONOSCOPY N/A 10/09/2015   Procedure: COLONOSCOPY;  Surgeon: Hulen Luster, MD;  Location: [facility] ENDOSCOPY;  Service: Gastroenterology;  Laterality: N/A;   ROTATOR CUFF REPAIR       Home Medications:  Prior to Admission medications   Medication Sig Start Date End Date Taking? Authorizing Provider   atorvastatin (LIPITOR) 10 MG tablet Take 10 mg by mouth daily.    [provider]  fluorouracil (EFUDEX) 5 % cream Apply topically 2 (two) times daily. As directed x 7 days to forehead and temples 05/28/21   Ralene Bathe, MD  hydrochlorothiazide (HYDRODIURIL) 12.5 MG tablet Take 12.5 mg by mouth daily. Patient not taking: Reported on 05/28/2021 03/08/17   [provider]  levothyroxine (SYNTHROID) 75 MCG tablet Take by mouth. 05/03/21 05/03/22  [provider]  lovastatin (MEVACOR) 40 MG tablet SMARTSIG:1 Tablet(s) By Mouth Every Evening 07/09/21   [provider]  traMADol (ULTRAM) 50 MG tablet Take 50 mg by mouth every 6 (six) hours as needed. Patient not taking: Reported on 05/28/2021 05/28/17   [provider]    Inpatient Medications: Scheduled Meds:  [START ON 10/21/2021] aspirin EC  81 mg Oral Daily   [START ON 10/21/2021] atorvastatin  80 mg Oral Daily   heparin  4,000 Units Intravenous Once   Continuous Infusions:  heparin     PRN Meds: acetaminophen, nitroGLYCERIN, ondansetron (ZOFRAN) IV  Allergies:   No Known Allergies  Social History:   Social History   Socioeconomic History   Marital status: Married    Spouse name: Not on file   Number of children: Not on file   Years of education: Not on file   Highest education level: Not on file  Occupational History   Not on  file  Tobacco Use   Smoking status: Former    Types: Cigarettes    Quit date: 07/19/1990    Years since quitting: 31.2   Smokeless tobacco: Former  Scientific laboratory technician Use: Never used  Substance and Sexual Activity   Alcohol use: Yes   Drug use: No   Sexual activity: Not on file  Other Topics Concern   Not on file  Social History Narrative   Not on file   Social Determinants of Health   Financial Resource Strain: Not on file  Food Insecurity: Not on file  Transportation Needs: Not on file  Physical Activity: Not on file  Stress: Not on file   Social Connections: Not on file  Intimate Partner Violence: Not on file    Family History:   No family history on file.   ROS:  Please see the history of present illness.   All other ROS reviewed and negative.     Physical Exam/Data:   Vitals:   10/20/21 0930 10/20/21 1045 10/20/21 1055 10/20/21 1108  BP: (!) 154/77   (!) 149/72  Pulse: 66 65 67   Resp: '20 16 16 14  '$ Temp: 98.4 F (36.9 C)     TempSrc: Oral     SpO2: 95% 96% 95%   Weight:      Height:       No intake or output data in the 24 hours ending 10/20/21 1140 Last 3 Weights 10/20/2021 05/29/2017 10/09/2015  Weight (lbs) 247 lb 240 lb 235 lb  Weight (kg) 112.038 kg 108.863 kg 106.595 kg     Body mass index is 38.69 kg/m.  General:  Well nourished, well developed, in no acute distress HEENT: normal Neck: no JVD Vascular: No carotid bruits; Distal pulses 2+ bilaterally Cardiac:  normal S1, S2; RRR; no murmur  Lungs:  clear to auscultation bilaterally, no wheezing, rhonchi or rales  Abd: soft, nontender, no hepatomegaly  Ext: no edema Musculoskeletal:  No deformities, BUE and BLE strength normal and equal Skin: warm and dry  Neuro:  CNs 2-12 intact, no focal abnormalities noted Psych:  Normal affect   EKG:  The EKG was personally reviewed and demonstrates:  NSR< 75bpm, LAD, inferolateral ST/T wave changes Telemetry:  Telemetry was personally reviewed and demonstrates:  NSR, 60-70s, PVCs (trigeminy)  Relevant CV Studies:  Echo ordered  Laboratory Data:  High Sensitivity Troponin:   Recent Labs  Lab 10/20/21 0931  TROPONINIHS 1,497*     Chemistry Recent Labs  Lab 10/20/21 0931  NA 139  K 4.1  CL 106  CO2 23  GLUCOSE 141*  BUN 12  CREATININE 0.98  CALCIUM 9.4  GFRNONAA >60  ANIONGAP 10    Recent Labs  Lab 10/20/21 0931  PROT 7.2  ALBUMIN 3.7  AST 34  ALT 19  ALKPHOS 79  BILITOT 0.7   Lipids No results for input(s): CHOL, TRIG, HDL, LABVLDL, LDLCALC, CHOLHDL in the last 168 hours.   Hematology Recent Labs  Lab 10/20/21 0931  WBC 6.2  RBC 5.42  HGB 15.8  HCT 47.9  MCV 88.4  MCH 29.2  MCHC 33.0  RDW 13.7  PLT 257   Thyroid No results for input(s): TSH, FREET4 in the last 168 hours.  BNPNo results for input(s): BNP, PROBNP in the last 168 hours.  DDimer No results for input(s): DDIMER in the last 168 hours.   Radiology/Studies:  DG Chest 2 View  Result Date: 10/20/2021 CLINICAL DATA:  Chest pain  on off for 4 days EXAM: CHEST - 2 VIEW COMPARISON:  11/16/2008 FINDINGS: Mild lingular scarring. No focal consolidation. No pleural effusion or pneumothorax. Heart and mediastinal contours are unremarkable. No acute osseous abnormality. Diffuse thoracic spine spondylosis. IMPRESSION: No active cardiopulmonary disease. Electronically Signed   By: Kathreen Devoid M.D.   On: 10/20/2021 09:51     Assessment and Plan:   NSTEMI - presented with chest pain found to have elevated troponin - EKG with some inferolateral ST/T wave changes - continue IV heparin - chest pain free after NTGx2 in the ER - start aspirin '81mg'$  daily - increase statin to '80mg'$  daily  - start Coreg 6.'25mg'$  BID - continue to trend troponin - echo ordered - cath on Monday  Risks and benefits of cardiac catheterization have been discussed with the patient.  These include bleeding, infection, kidney damage, stroke, heart attack, death.  The patient understands these risks and is willing to proceed.   HTN - PTA HCTZ 12.'5mg'$  daily>hold - start Coreg as above  HLD - lipid panel ordered - PTA Lipitor '10mg'$  daily and ?Lovastatin - increase Lipitor to '80mg'$  daily  OSA - stopped using CPAP many years ago - needs OP sleep study  Obesity - weight loss recommended  For questions or updates, please contact [place] Please consult [email or web address] for contact info under    Signed, Cadence Ninfa Meeker, PA-C  10/20/2021 11:40 AM   I have seen and examined this patient with Cadence Furth.  Agree with above,  note added to reflect my findings.  Patient presented to the hospital with chest pain.  He was sent to the emergency room from urgent care.  He had pain last night when he was watching TV.  It was waxing and waning.  He felt clammy.  He was not short of breath with no nausea or vomiting.  On presentation to the emergency room, his troponin was elevated.  It is peaked at 1600.  GEN: Well nourished, well developed, in no acute distress  HEENT: normal  Neck: no JVD, carotid bruits, or masses Cardiac: RRR; no murmurs, rubs, or gallops,no edema  Respiratory:  clear to auscultation bilaterally, normal work of breathing GI: soft, nontender, nondistended, + BS MS: no deformity or atrophy  Skin: warm and dry Neuro:  Strength and sensation are intact Psych: euthymic mood, full affect   1.  Non-STEMI: Currently on IV heparin.  Patient is fortunately pain-free.  Continue aspirin 81 mg and carvedilol 6.25 mg twice daily.  We Lorraine Terriquez plan for left heart catheterization on Monday. Hypertension: Continue Coreg  Hyperlipidemia: Goal LDL less than 70.  Continue Lipitor 80 mg. Obstructive sleep apnea: Dmarcus Decicco need outpatient sleep study.   Oma Alpert M. Saraih Lorton MD 10/20/2021 1:40 PM

## 2021-10-20 NOTE — ED Triage Notes (Signed)
Pt presents with chest tightness that has been happening off and on for the past 4 days. Pt felt clammy with the pain as well  ?

## 2021-10-20 NOTE — ED Provider Notes (Signed)
?[facility] ? ? ? ?CSN: [id number] ?Arrival date & time: 10/20/21  0086 ? ? ?  ? ?History   ?Chief Complaint ?Chief Complaint  ?Patient presents with  ? Chest Pain  ? ? ?HPI ?Alan Stewart is a 76 y.o. male.  ? ?HPI ?Patient with medical history significant for obesity , hyperlipidemia, hypertension, and sleep apnea  present with 4 day history of recurrent chest pain.  Patient was the chest pain duration became longer, the pain was stronger and he did have radiation to his left arm.  He also experienced a clammy feeling during the episode of chest pain last night.He reports mild shortness of breath and has noticed swelling in his lower extremities however reports he always has swelling.  He has HCTZ listed on his chart but reports he has not been taking diuretic medication. He has also been belching rather frequently. Denies any known history of any cardiac problems beyond hypertension. He is not currently experiencing the chest pain. Uncertain of when he had an ECG last.  ?Past Medical History:  ?Diagnosis Date  ? Hyperglycemia   ? Hyperlipidemia   ? Hypertension   ? Osteoarthritis   ? Sleep apnea   ? ? ?There are no problems to display for this patient. ? ? ?Past Surgical History:  ?Procedure Laterality Date  ? COLONOSCOPY N/A 10/09/2015  ? Procedure: COLONOSCOPY;  Surgeon: Hulen Luster, MD;  Location: [facility] ENDOSCOPY;  Service: Gastroenterology;  Laterality: N/A;  ? ROTATOR CUFF REPAIR    ? ? ? ? ? ?Home Medications   ? ?Prior to Admission medications   ?Medication Sig Start Date End Date Taking? Authorizing Provider  ?levothyroxine (SYNTHROID) 75 MCG tablet Take by mouth. 05/03/21 05/03/22 Yes [provider]  ?atorvastatin (LIPITOR) 10 MG tablet Take 10 mg by mouth daily.    [provider]  ?fluorouracil (EFUDEX) 5 % cream Apply topically 2 (two) times daily. As directed x 7 days to forehead and temples 05/28/21   Ralene Bathe, MD  ?hydrochlorothiazide (HYDRODIURIL) 12.5 MG tablet Take  12.5 mg by mouth daily. ?Patient not taking: Reported on 05/28/2021 03/08/17   [provider]  ?lovastatin (MEVACOR) 40 MG tablet SMARTSIG:1 Tablet(s) By Mouth Every Evening 07/09/21   [provider]  ?traMADol (ULTRAM) 50 MG tablet Take 50 mg by mouth every 6 (six) hours as needed. ?Patient not taking: Reported on 05/28/2021 05/28/17   [provider]  ? ? ?Family History ?History reviewed. No pertinent family history. ? ?Social History ?Social History  ? ?Tobacco Use  ? Smoking status: Former  ?  Types: Cigarettes  ?  Quit date: 07/19/1990  ?  Years since quitting: 31.2  ? Smokeless tobacco: Former  ?Vaping Use  ? Vaping Use: Never used  ?Substance Use Topics  ? Alcohol use: Yes  ? Drug use: No  ? ? ? ?Allergies   ?Patient has no known allergies. ? ? ?Review of Systems ?Review of Systems ?Pertinent negatives listed in HPI  ? ?Physical Exam ?Triage Vital Signs ?ED Triage Vitals  ?Enc Vitals Group  ?   BP 10/20/21 0840 137/87  ?   Pulse Rate 10/20/21 0840 69  ?   Resp 10/20/21 0840 18  ?   Temp 10/20/21 0840 98 ?F (36.7 ?C)  ?   Temp Source 10/20/21 0840 Oral  ?   SpO2 10/20/21 0840 96 %  ?   Weight --   ?   Height --   ?  Head Circumference --   ?   Peak Flow --   ?   Pain Score 10/20/21 0838 3  ?   Pain Loc --   ?   Pain Edu? --   ?   Excl. in [place]? --   ? ?No data found. ? ?Updated Vital Signs ?BP 137/87 (BP Location: Left Arm)   Pulse 69   Temp 98 ?F (36.7 ?C) (Oral)   Resp 18   SpO2 96%  ? ?Visual Acuity ?Right Eye Distance:   ?Left Eye Distance:   ?Bilateral Distance:   ? ?Right Eye Near:   ?Left Eye Near:    ?Bilateral Near:    ? ?Physical Exam ?Vitals reviewed.  ?Constitutional:   ?   Appearance: He is well-developed. He is obese.  ?Eyes:  ?   Extraocular Movements: Extraocular movements intact.  ?   Pupils: Pupils are equal, round, and reactive to light.  ?Cardiovascular:  ?   Rate and Rhythm: Normal rate. Rhythm regularly irregular.  ?Musculoskeletal:  ?   Right lower leg:  Edema present.  ?   Left lower leg: Edema present.  ?Skin: ?   General: Skin is warm and dry.  ?   Capillary Refill: Capillary refill takes less than 2 seconds.  ?Neurological:  ?   General: No focal deficit present.  ?   Mental Status: He is alert.  ?   GCS: GCS eye subscore is 4. GCS verbal subscore is 5. GCS motor subscore is 6.  ? ? ? ?UC Treatments / Results  ?Labs ?(all labs ordered are listed, but only abnormal results are displayed) ?Labs Reviewed - No data to display ? ?EKG ? ? ?Radiology ?No results found. ? ?Procedures ?Procedures (including critical care time) ? ?Medications Ordered in UC ?Medications - No data to display ? ?Initial Impression / Assessment and Plan / UC Course  ?I have reviewed the triage vital signs and the nursing notes. ? ?Pertinent labs & imaging results that were available during my care of the patient were reviewed by me and considered in my medical decision making (see chart for details). ? ? ? ?  ?Patient is a 76 year old male with history of hypertension, obesity and hyperlipidemia presents with 4-day history of chest pain.  EKG abnormal with PVCs and nonspecific ST-T wave abnormalities.  Patient wants to follow-up for evaluation in setting of a large acute pulmonary dysfunction.  Given limitations of urgent care patient is advised to follow-up at [facility] emergency department.  He is accompanied by his spouse who is in agreement with transporting patient.  He is alert oriented and stable.  He is agreeable to be evaluated and will be transferred impersonal mother. ?Patient verbalizes that he understands the  emergent need for work-up of  chest pain and will go immediately to the ER. ?Final Clinical Impressions(s) / UC Diagnoses  ? ?Final diagnoses:  ?Chest pain, unspecified type  ?Abnormal EKG  ? ?Discharge Instructions   ?None ?  ? ?ED Prescriptions   ?None ?  ? ?PDMP not reviewed this encounter. ?  ?Scot Jun, FNP ?10/20/21 0915 ? ?

## 2021-10-20 NOTE — ED Provider Notes (Signed)
? ?[facility] ?Provider Note ? ? ? Event Date/Time  ? First MD Initiated Contact with Patient 10/20/21 1134   ?  (approximate) ? ? ?History  ? ?Chest Pain ? ? ?HPI ? ?Alan Stewart is a 76 y.o. male with a history of hypertension but no history of CAD who presents with complaints of chest pain.  Patient reports over the last week he has had multiple episodes of chest pressure.  He presents today because last night his chest pressure became severe and kept him from sleeping.  Currently he reports his symptoms have improved.  Denies shortness of breath.  No calf pain or swelling.  Has not take anything for this. ?  ? ? ?Physical Exam  ? ?Triage Vital Signs: ?ED Triage Vitals  ?Enc Vitals Group  ?   BP 10/20/21 0930 (!) 154/77  ?   Pulse Rate 10/20/21 0930 66  ?   Resp 10/20/21 0930 20  ?   Temp 10/20/21 0930 98.4 ?F (36.9 ?C)  ?   Temp Source 10/20/21 0930 Oral  ?   SpO2 10/20/21 0930 95 %  ?   Weight 10/20/21 0927 112 kg (247 lb)  ?   Height 10/20/21 0927 1.702 m ('5\' 7"'$ )  ?   Head Circumference --   ?   Peak Flow --   ?   Pain Score 10/20/21 0926 3  ?   Pain Loc --   ?   Pain Edu? --   ?   Excl. in [place]? --   ? ? ?Most recent vital signs: ?Vitals:  ? 10/20/21 1108 10/20/21 1130  ?BP: (!) 149/72 (!) 170/76  ?Pulse:  70  ?Resp: 14 16  ?Temp:    ?SpO2:  96%  ? ? ? ?General: Awake, no distress.  ?CV:  Good peripheral perfusion.  Regular rate and rhythm ?Resp:  Normal effort.  CTA B ?Abd:  No distention.  ?Other:  No calf pain or swelling ? ? ?ED Results / Procedures / Treatments  ? ?Labs ?(all labs ordered are listed, but only abnormal results are displayed) ?Labs Reviewed  ?COMPREHENSIVE METABOLIC PANEL - Abnormal; Notable for the following components:  ?    Result Value  ? Glucose, Bld 141 (*)   ? All other components within normal limits  ?TROPONIN I (HIGH SENSITIVITY) - Abnormal; Notable for the following components:  ? Troponin I (High Sensitivity) 1,497 (*)   ? All other components within  normal limits  ?CBC  ?APTT  ?PROTIME-INR  ?HEMOGLOBIN A1C  ?TSH  ?LIPID PANEL  ?HEPARIN LEVEL (UNFRACTIONATED)  ?TROPONIN I (HIGH SENSITIVITY)  ? ? ? ?EKG ? ?ED ECG REPORT ?I, Lavonia Drafts, the attending physician, personally viewed and interpreted this ECG. ? ?Date: 10/20/2021 ? ?Rhythm: normal sinus rhythm ?QRS Axis: normal ?Intervals: normal ?ST/T Wave abnormalities: Nonspecific changes  ? ? ? ?RADIOLOGY ?Chest x-ray viewed interpreted by me, no acute abnormality, pending radiology review ? ? ? ?PROCEDURES: ? ?Critical Care performed: yes ? ?CRITICAL CARE ?Performed by: Lavonia Drafts ? ? ?Total critical care time:35 minutes ? ?Critical care time was exclusive of separately billable procedures and treating other patients. ? ?Critical care was necessary to treat or prevent imminent or life-threatening deterioration. ? ?Critical care was time spent personally by me on the following activities: development of treatment plan with patient and/or surrogate as well as nursing, discussions with consultants, evaluation of patient's response to treatment, examination of patient, obtaining history from patient or surrogate, ordering  and performing treatments and interventions, ordering and review of laboratory studies, ordering and review of radiographic studies, pulse oximetry and re-evaluation of patient's condition. ? ? ?Procedures ? ? ?MEDICATIONS ORDERED IN ED: ?Medications  ?heparin ADULT infusion 100 units/mL (25000 units/263m) (1,250 Units/hr Intravenous New Bag/Given 10/20/21 1147)  ?nitroGLYCERIN (NITROSTAT) SL tablet 0.4 mg (0.4 mg Sublingual Given 10/20/21 1143)  ?acetaminophen (TYLENOL) tablet 650 mg (has no administration in time range)  ?ondansetron (ZOFRAN) injection 4 mg (has no administration in time range)  ?aspirin EC tablet 81 mg (has no administration in time range)  ?atorvastatin (LIPITOR) tablet 80 mg (has no administration in time range)  ?carvedilol (COREG) tablet 6.25 mg (has no administration in  time range)  ?sodium chloride flush (NS) 0.9 % injection 3 mL (has no administration in time range)  ?aspirin chewable tablet 324 mg (324 mg Oral Given 10/20/21 1106)  ?heparin bolus via infusion 4,000 Units (4,000 Units Intravenous Bolus from Bag 10/20/21 1147)  ? ? ? ?IMPRESSION / MDM / ASSESSMENT AND PLAN / ED COURSE  ?I reviewed the triage vital signs and the nursing notes. ? ?Patient presents with intermittent chest pain as detailed above, concerning for ACS versus angina ? ?EKG is overall reassuring however high sensitive troponin has returned at almost 1500 this is significantly elevated. ? ?CBC and BMP are overall reassuring ? ?He is asymptomatic at this time however we will treat with aspirin 324 mg and start him on a heparin gtt. ? ?Did speak with Dr. CCurt Bearsof cardiology who will consult on the patient ? ?Did discuss with the hospitalist who will admit the patient ? ? ? ? ? ?  ? ? ?FINAL CLINICAL IMPRESSION(S) / ED DIAGNOSES  ? ?Final diagnoses:  ?NSTEMI (non-ST elevated myocardial infarction) ([place]  ? ? ? ?Rx / DC Orders  ? ?ED Discharge Orders   ? ? None  ? ?  ? ? ? ?Note:  This document was prepared using Dragon voice recognition software and may include unintentional dictation errors. ?  ?KLavonia Drafts MD ?10/20/21 1237 ? ?

## 2021-10-20 NOTE — H&P (Addendum)
History and Physical    Patient: Alan Stewart YTK:[id number] DOB: [birth year] DOA: 10/20/2021 DOS: the patient was seen and examined on 10/20/2021 PCP: Adin Hector, MD  Patient coming from: Home  Chief Complaint:  Chief Complaint  Patient presents with   Chest Pain    HPI:  Alan Stewart is a 76 yo male with PMH obesity, HTN, HLD, hypothyroidism, OSA who presented to the ER with intermittent substernal chest pain radiating to his left arm.  He endorsed symptoms have been intermittent over the past 1 week.  He states approximately 4 episodes lasting approximately 5 minutes at a time.  Stopping to rest does help and precipitating factors do include some physical exertion such as cutting the grass however he also states symptoms occurred at rest as well.  He endorsed associated diaphoresis and belching but denied any nausea or vomiting.  He denies any prior history of similar.  Last night around 12:30 AM he could not sleep due to having chest pain and finally presented to the hospital this morning for further evaluation. He does endorse a family history of heart disease but could not elaborate too much into this. He no longer uses CPAP for his history of OSA. He quit smoking at 76 years old and quit drinking in 1984.  He denied any illicit drug use.  Workup in the ER was notable for troponin 1,497 initially. EKG showed 1 mm ST depression in V3 - V6.  He was loaded with aspirin 324 mg and started on a heparin drip. Cardiology was also consulted on admission.   Assessment and Plan: * NSTEMI (non-ST elevated myocardial infarction) ([place]) - clinically consistent with NSTEMI. Presented with SS chest pain, diaphoresis, belching. Multiple risk factors including obesity, HTN, HLD, family history - initial troponin 1497 and uptrending; need to continued trending to peak - EKG shows ~ 1 mm ST depression in V3-V6.  - s/p asa load in ER; continue daily  - continue heparin drip - cardiology following;  tentative plan for cath on Monday. TIMI score 5 points - NTG SL PRN - check TSH, lipid, A1c - obtain echo   Obesity (BMI 75-17.0) - Complicates overall prognosis and care - Body mass index is 38.69 kg/m. - Weight Loss and Dietary Counseling given  Hypothyroidism - Follow-up TSH: 2.044 - Resume Synthroid pending med rec and TSH result  Hyperlipidemia - Follow-up lipid panel: LDL 112 - Increase home statin to high intensity  Sleep apnea - patient states no longer on nightly CPAP - will need further lifestyle modification including weight loss  Hypertension - Hold HCTZ - Cardiology starting Coreg.  Follow-up echo as well    Review of Systems: Review of Systems  Constitutional:  Positive for diaphoresis and malaise/fatigue.  HENT: Negative.    Eyes: Negative.   Respiratory: Negative.    Cardiovascular:  Positive for chest pain and leg swelling. Negative for palpitations.  Gastrointestinal: Negative.   Genitourinary: Negative.   Musculoskeletal: Negative.   Skin: Negative.   Neurological: Negative.   Endo/Heme/Allergies: Negative.   Psychiatric/Behavioral: Negative.     Past Medical History:  Diagnosis Date   Hyperglycemia    Hyperlipidemia    Hypertension    Osteoarthritis    Sleep apnea    Past Surgical History:  Procedure Laterality Date   COLONOSCOPY N/A 10/09/2015   Procedure: COLONOSCOPY;  Surgeon: Hulen Luster, MD;  Location: [facility] ENDOSCOPY;  Service: Gastroenterology;  Laterality: N/A;   ROTATOR CUFF REPAIR  Social History:  reports that he quit smoking about 31 years ago. His smoking use included cigarettes. He has quit using smokeless tobacco. He reports current alcohol use. He reports that he does not use drugs.  No Known Allergies  No family history on file.  Prior to Admission medications   Medication Sig Start Date End Date Taking? Authorizing Provider  atorvastatin (LIPITOR) 10 MG tablet Take 10 mg by mouth daily.   Yes [provider]  ibuprofen (ADVIL) 200 MG tablet Take 200 mg by mouth every 6 (six) hours as needed for headache or mild pain.   Yes [provider]  lovastatin (MEVACOR) 40 MG tablet SMARTSIG:1 Tablet(s) By Mouth Every Evening 07/09/21  Yes [provider]  ranitidine (ZANTAC) 75 MG tablet Take 75 mg by mouth 2 (two) times daily.   Yes [provider]  fluorouracil (EFUDEX) 5 % cream Apply topically 2 (two) times daily. As directed x 7 days to forehead and temples 05/28/21   Ralene Bathe, MD  hydrochlorothiazide (HYDRODIURIL) 12.5 MG tablet Take 12.5 mg by mouth daily. Patient not taking: Reported on 05/28/2021 03/08/17   [provider]  levothyroxine (SYNTHROID) 75 MCG tablet Take by mouth. Patient not taking: Reported on 10/20/2021 05/03/21 05/03/22  [provider]  traMADol (ULTRAM) 50 MG tablet Take 50 mg by mouth every 6 (six) hours as needed. Patient not taking: Reported on 05/28/2021 05/28/17   [provider]    Physical Exam: Vitals:   10/20/21 1215 10/20/21 1230 10/20/21 1245 10/20/21 1300  BP: 131/76 132/87 140/70 129/76  Pulse: 64 64 62 63  Resp: '13 11 14 15  '$ Temp:      TempSrc:      SpO2: 94% 94% 94% 92%  Weight:      Height:       Physical Exam Constitutional:      General: He is not in acute distress.    Appearance: Normal appearance. He is obese.  HENT:     Head: Normocephalic and atraumatic.     Mouth/Throat:     Mouth: Mucous membranes are moist.  Eyes:     Extraocular Movements: Extraocular movements intact.  Cardiovascular:     Rate and Rhythm: Normal rate and regular rhythm.     Heart sounds: Normal heart sounds.  Pulmonary:     Effort: Pulmonary effort is normal. No respiratory distress.     Breath sounds: Normal breath sounds. No wheezing.  Abdominal:     General: Bowel sounds are normal. There is no distension.     Palpations: Abdomen is soft.     Tenderness: There is no abdominal tenderness.   Musculoskeletal:        General: Swelling present. Normal range of motion.     Cervical back: Normal range of motion and neck supple.     Comments: 1-2+ B/L LE edema  Skin:    General: Skin is warm and dry.  Neurological:     General: No focal deficit present.     Mental Status: He is alert.  Psychiatric:        Mood and Affect: Mood normal.        Behavior: Behavior normal.     Data Reviewed: Results for orders placed or performed during the hospital encounter of 10/20/21 (from the past 24 hour(s))  CBC     Status: None   Collection Time: 10/20/21  9:31 AM  Result Value Ref Range   WBC 6.2 4.0 -  10.5 K/uL   RBC 5.42 4.22 - 5.81 MIL/uL   Hemoglobin 15.8 13.0 - 17.0 g/dL   HCT 47.9 39.0 - 52.0 %   MCV 88.4 80.0 - 100.0 fL   MCH 29.2 26.0 - 34.0 pg   MCHC 33.0 30.0 - 36.0 g/dL   RDW 13.7 11.5 - 15.5 %   Platelets 257 150 - 400 K/uL   nRBC 0.0 0.0 - 0.2 %  Troponin I (High Sensitivity)     Status: Abnormal   Collection Time: 10/20/21  9:31 AM  Result Value Ref Range   Troponin I (High Sensitivity) 1,497 (HH) <18 ng/L  Comprehensive metabolic panel     Status: Abnormal   Collection Time: 10/20/21  9:31 AM  Result Value Ref Range   Sodium 139 135 - 145 mmol/L   Potassium 4.1 3.5 - 5.1 mmol/L   Chloride 106 98 - 111 mmol/L   CO2 23 22 - 32 mmol/L   Glucose, Bld 141 (H) 70 - 99 mg/dL   BUN 12 8 - 23 mg/dL   Creatinine, Ser 0.98 0.61 - 1.24 mg/dL   Calcium 9.4 8.9 - 10.3 mg/dL   Total Protein 7.2 6.5 - 8.1 g/dL   Albumin 3.7 3.5 - 5.0 g/dL   AST 34 15 - 41 U/L   ALT 19 0 - 44 U/L   Alkaline Phosphatase 79 38 - 126 U/L   Total Bilirubin 0.7 0.3 - 1.2 mg/dL   GFR, Estimated >60 >60 mL/min   Anion gap 10 5 - 15  APTT     Status: None   Collection Time: 10/20/21 11:42 AM  Result Value Ref Range   aPTT 27 24 - 36 seconds  Protime-INR     Status: None   Collection Time: 10/20/21 11:42 AM  Result Value Ref Range   Prothrombin Time 13.1 11.4 - 15.2 seconds   INR 1.0 0.8  - 1.2  TSH     Status: None   Collection Time: 10/20/21 11:42 AM  Result Value Ref Range   TSH 2.044 0.350 - 4.500 uIU/mL  Lipid panel     Status: Abnormal   Collection Time: 10/20/21 11:42 AM  Result Value Ref Range   Cholesterol 180 0 - 200 mg/dL   Triglycerides 171 (H) <150 mg/dL   HDL 34 (L) >40 mg/dL   Total CHOL/HDL Ratio 5.3 RATIO   VLDL 34 0 - 40 mg/dL   LDL Cholesterol 112 (H) 0 - 99 mg/dL  Troponin I (High Sensitivity)     Status: Abnormal   Collection Time: 10/20/21 11:42 AM  Result Value Ref Range   Troponin I (High Sensitivity) 1,615 (HH) <18 ng/L    I have Reviewed nursing notes, Vitals, and Lab results since pt's last encounter. Pertinent lab results : see above I have ordered test including BMP, CBC, Mg I have reviewed the last note from staff over past 24 hours I have discussed pt's care plan and test results with nursing staff, case manager  Advance Care Planning:   Code Status: Full Code   Consults:  Cardiology  Family Communication: wife  Severity of Illness: The appropriate patient status for this patient is INPATIENT. Inpatient status is judged to be reasonable and necessary in order to provide the required intensity of service to ensure the patient's safety. The patient's presenting symptoms, physical exam findings, and initial radiographic and laboratory data in the context of their chronic comorbidities is felt to place them at high risk for further clinical  deterioration. Furthermore, it is not anticipated that the patient will be medically stable for discharge from the hospital within 2 midnights of admission.   * I certify that at the point of admission it is my clinical judgment that the patient will require inpatient hospital care spanning beyond 2 midnights from the point of admission due to high intensity of service, high risk for further deterioration and high frequency of surveillance required.*  Author: Dwyane Dee, MD 10/20/2021 1:25 PM  For  on call review [email or web address].

## 2021-10-20 NOTE — Discharge Instructions (Addendum)
Go immediately to the Emergency department for further work-up and evaluation of the source of your chest pain include labs and cardiac enzymes to rule out any acute cardiac dysfunction.  ?

## 2021-10-20 NOTE — ED Provider Triage Note (Signed)
Emergency Medicine Provider Triage Evaluation Note ? ?Alan Stewart , a 76 y.o. male  was evaluated in triage.  Pt complains of chest pain, substernal for several weeks.Also burping.  No SOB, no prior CP or cardiac history.  4 episodes of shortness of breath per wife.  Went to Urgent Care this am and sent to ER.  Chest pain worse at night.  States right upper extremity doesn't feel right.  ? ?Review of Systems  ?Positive: nausea ?Negative: No diarrhea. ? ?Physical Exam  ?There were no vitals taken for this visit. ?Gen:   Awake, no distress  alert, answers questions. ?Resp:  Normal effort   Lungs Clear.   ?MSK:   Moves extremities without difficulty  ?Other:  No tender abdomen.  ? ?Medical Decision Making  ?Medically screening exam initiated at 9:22 AM.  Appropriate orders placed.  Alan Stewart was informed that the remainder of the evaluation will be completed by another provider, this initial triage assessment does not replace that evaluation, and the importance of remaining in the ED until their evaluation is complete. ? ? ?  ?Alan Hai, PA-C ?10/20/21 0930 ? ?

## 2021-10-20 NOTE — Plan of Care (Signed)
Cardio and Hosp informed of upward progression of trops 1497, 1615, and last 2680.  Patient still asymptomatic and not needing any more Nitro to assist.  Cath still set for 3/6, so diet ordered for now. ?

## 2021-10-20 NOTE — Assessment & Plan Note (Addendum)
-   patient states no longer on nightly CPAP ?- will need further lifestyle modification including weight loss ?-Outpatient sleep study recommended ?

## 2021-10-20 NOTE — ED Triage Notes (Signed)
Pt states he started having CP on and off x4 days- pt went to UC and had an EKG done and was told it was abnormal and he was sent here- pt denies n/v- pt states pain is in the center of his chest- pt states pain is mostly at night- pt also states L arm "does not feel right" ?

## 2021-10-20 NOTE — Progress Notes (Signed)
ANTICOAGULATION CONSULT NOTE ? ?Pharmacy Consult for IV heparin ?Indication: chest pain/ACS/STEMI ? ?No Known Allergies ? ?Patient Measurements: ?Height: '5\' 7"'$  (170.2 cm) ?Weight: 112 kg (247 lb) ?IBW/kg (Calculated) : 66.1 ?Heparin Dosing Weight: 91.4 kg ? ?Vital Signs: ?Temp: 98.4 ?F (36.9 ?C) (03/04 0930) ?Temp Source: Oral (03/04 0930) ?BP: 154/77 (03/04 0930) ?Pulse Rate: 67 (03/04 1055) ? ?Labs: ?Recent Labs  ?  10/20/21 ?0931  ?HGB 15.8  ?HCT 47.9  ?PLT 257  ?CREATININE 0.98  ?TROPONINIHS 1,497*  ? ? ?Estimated Creatinine Clearance: 80.2 mL/min (by C-G formula based on SCr of 0.98 mg/dL). ? ? ?Medical History: ?Past Medical History:  ?Diagnosis Date  ? Hyperglycemia   ? Hyperlipidemia   ? Hypertension   ? Osteoarthritis   ? Sleep apnea   ? ? ?Medications:  ? aspirin  324 mg Oral Once  ? ?Assessment: ?75YOM presenting with chest tightness and 4 d history of recurrent chest pain. PMH obesity , hyperlipidemia, hypertension, and sleep apnea. ? ?Troponins 1497 > ?EKG showing PVCs and ST-T wave abnormalities.  ? ?Baseline CBC stable (Plts 257). APTT/INR collected. ? ?Goal of Therapy:  ?Heparin level 0.3-0.7 units/ml ?Monitor platelets by anticoagulation protocol: Yes ?  ?Plan:  ?Give 4,000 units bolus x 1 ?Start heparin infusion at 1250 units/hr ?8-hour heparin level ?Daily CBC, HL ? ? ?Wynelle Cleveland, PharmD ?Pharmacy Resident  ?10/20/2021 ?11:01 AM ? ?

## 2021-10-20 NOTE — Assessment & Plan Note (Addendum)
-   TSH: 2.044 ?- Patient states he is no longer on Synthroid for quite some time ?- Given normal TSH, will hold off on any Synthroid ?

## 2021-10-20 NOTE — Assessment & Plan Note (Addendum)
-   Hold HCTZ ?- Cardiology starting Coreg.  ?

## 2021-10-20 NOTE — Assessment & Plan Note (Signed)
-   Complicates overall prognosis and care ?- Body mass index is 38.69 kg/m?. ?- Weight Loss and Dietary Counseling given ?

## 2021-10-20 NOTE — Progress Notes (Signed)
ANTICOAGULATION CONSULT NOTE ? ?Pharmacy Consult for IV heparin ?Indication: chest pain/ACS/STEMI ? ?No Known Allergies ? ?Patient Measurements: ?Height: '5\' 7"'$  (170.2 cm) ?Weight: 123.6 kg (272 lb 8 oz) ?IBW/kg (Calculated) : 66.1 ?Heparin Dosing Weight: 91.4 kg ? ?Vital Signs: ?Temp: 98 ?F (36.7 ?C) (03/04 1842) ?Temp Source: Oral (03/04 1800) ?BP: 154/84 (03/04 1842) ?Pulse Rate: 64 (03/04 1842) ? ?Labs: ?Recent Labs  ?  10/20/21 ?4098 10/20/21 ?1142 10/20/21 ?1616 10/20/21 ?2017  ?HGB 15.8  --   --   --   ?HCT 47.9  --   --   --   ?PLT 257  --   --   --   ?APTT  --  27  --   --   ?LABPROT  --  13.1  --   --   ?INR  --  1.0  --   --   ?HEPARINUNFRC  --   --   --  0.30  ?CREATININE 0.98  --   --   --   ?TROPONINIHS 1,497* 1,615* 2,680*  --   ? ? ? ?Estimated Creatinine Clearance: 84.6 mL/min (by C-G formula based on SCr of 0.98 mg/dL). ? ? ?Medical History: ?Past Medical History:  ?Diagnosis Date  ? Hyperglycemia   ? Hyperlipidemia   ? Hypertension   ? Osteoarthritis   ? Sleep apnea   ? ? ?Medications:  ? [START ON 10/21/2021] aspirin EC  81 mg Oral Daily  ? [START ON 10/21/2021] atorvastatin  80 mg Oral Daily  ? carvedilol  6.25 mg Oral BID WC  ? sodium chloride flush  3 mL Intravenous Q12H  ? ?Assessment: ?76 year old male with history of HLD, HTN, obesity, and sleep apnea presents with chest tightness and recurrent chest pain x4 days. Pharmacy consulted for management of heparin drip in the setting of ACS. ? ?Baseline CBC stable (Plts 257). APTT/INR collected. ? ?Goal of Therapy:  ?Heparin level 0.3-0.7 units/ml ?Monitor platelets by anticoagulation protocol: Yes ?  ?Plan:  ?Continue heparin infusion at 1250 units/hr ?Next heparin level due in 8 hours ?Continue to monitor CBC daily  ? ?Darrick Penna, PharmD, MS PGPM ?Clinical Pharmacist ?10/20/2021 ?9:19 PM ? ? ?

## 2021-10-21 ENCOUNTER — Inpatient Hospital Stay (HOSPITAL_COMMUNITY)
Admit: 2021-10-21 | Discharge: 2021-10-21 | Disposition: A | Discharge status: Home / Self Care | Payer: Medicare HMO | Attending: Internal Medicine | Admitting: Internal Medicine

## 2021-10-21 DIAGNOSIS — I214 Non-ST elevation (NSTEMI) myocardial infarction: Secondary | ICD-10-CM | POA: Diagnosis not present

## 2021-10-21 DIAGNOSIS — E782 Mixed hyperlipidemia: Secondary | ICD-10-CM

## 2021-10-21 DIAGNOSIS — E669 Obesity, unspecified: Secondary | ICD-10-CM

## 2021-10-21 LAB — ECHOCARDIOGRAM COMPLETE
AR max vel: 2.41 cm2
AV Peak grad: 4.5 mmHg
Ao pk vel: 1.06 m/s
Area-P 1/2: 3.2 cm2
Height: 67 in
S' Lateral: 4.25 cm
Weight: 4360 oz

## 2021-10-21 LAB — CBC WITH DIFFERENTIAL/PLATELET
Abs Immature Granulocytes: 0.01 10*3/uL (ref 0.00–0.07)
Basophils Absolute: 0 10*3/uL (ref 0.0–0.1)
Basophils Relative: 1 %
Eosinophils Absolute: 0.1 10*3/uL (ref 0.0–0.5)
Eosinophils Relative: 2 %
HCT: 46.1 % (ref 39.0–52.0)
Hemoglobin: 15.4 g/dL (ref 13.0–17.0)
Immature Granulocytes: 0 %
Lymphocytes Relative: 32 %
Lymphs Abs: 1.7 10*3/uL (ref 0.7–4.0)
MCH: 29.4 pg (ref 26.0–34.0)
MCHC: 33.4 g/dL (ref 30.0–36.0)
MCV: 88 fL (ref 80.0–100.0)
Monocytes Absolute: 0.5 10*3/uL (ref 0.1–1.0)
Monocytes Relative: 9 %
Neutro Abs: 2.9 10*3/uL (ref 1.7–7.7)
Neutrophils Relative %: 56 %
Platelets: 221 10*3/uL (ref 150–400)
RBC: 5.24 MIL/uL (ref 4.22–5.81)
RDW: 13.9 % (ref 11.5–15.5)
WBC: 5.2 10*3/uL (ref 4.0–10.5)
nRBC: 0 % (ref 0.0–0.2)

## 2021-10-21 LAB — BASIC METABOLIC PANEL
Anion gap: 6 (ref 5–15)
BUN: 12 mg/dL (ref 8–23)
CO2: 25 mmol/L (ref 22–32)
Calcium: 8.9 mg/dL (ref 8.9–10.3)
Chloride: 103 mmol/L (ref 98–111)
Creatinine, Ser: 0.97 mg/dL (ref 0.61–1.24)
GFR, Estimated: >60 mL/min (ref >60)
Glucose, Bld: 140 mg/dL — ABNORMAL HIGH (ref 70–99)
Potassium: 3.7 mmol/L (ref 3.5–5.1)
Sodium: 134 mmol/L — ABNORMAL LOW (ref 135–145)

## 2021-10-21 LAB — TROPONIN I (HIGH SENSITIVITY)
Troponin I (High Sensitivity): 2938 ng/L (ref <18)
Troponin I (High Sensitivity): 1764 ng/L (ref <18)
Troponin I (High Sensitivity): 1507 ng/L (ref <18)
Troponin I (High Sensitivity): 1441 ng/L (ref <18)

## 2021-10-21 LAB — MAGNESIUM: Magnesium: 1.9 mg/dL (ref 1.7–2.4)

## 2021-10-21 LAB — HEPARIN LEVEL (UNFRACTIONATED): Heparin Unfractionated: 0.43 IU/mL (ref 0.30–0.70)

## 2021-10-21 NOTE — Progress Notes (Signed)
ANTICOAGULATION CONSULT NOTE ? ?Pharmacy Consult for IV heparin ?Indication: chest pain/ACS/STEMI ? ?No Known Allergies ? ?Patient Measurements: ?Height: '5\' 7"'$  (170.2 cm) ?Weight: 123.6 kg (272 lb 8 oz) ?IBW/kg (Calculated) : 66.1 ?Heparin Dosing Weight: 91.4 kg ? ?Vital Signs: ?Temp: 98.3 ?F (36.8 ?C) (03/05 0419) ?Temp Source: Oral (03/05 0419) ?BP: 147/80 (03/05 0419) ?Pulse Rate: 65 (03/05 0419) ? ?Labs: ?Recent Labs  ?  10/20/21 ?1610 10/20/21 ?1142 10/20/21 ?1616 10/20/21 ?2017 10/20/21 ?2132 10/21/21 ?0407  ?HGB 15.8  --   --   --   --  15.4  ?HCT 47.9  --   --   --   --  46.1  ?PLT 257  --   --   --   --  221  ?APTT  --  27  --   --   --   --   ?LABPROT  --  13.1  --   --   --   --   ?INR  --  1.0  --   --   --   --   ?HEPARINUNFRC  --   --   --  0.30  --  0.43  ?CREATININE 0.98  --   --   --   --  0.97  ?TROPONINIHS [id number]* 9,604* 2,680*  --  4,253* 2,938*  ? ? ? ?Estimated Creatinine Clearance: 85.5 mL/min (by C-G formula based on SCr of 0.97 mg/dL). ? ? ?Medical History: ?Past Medical History:  ?Diagnosis Date  ? Hyperglycemia   ? Hyperlipidemia   ? Hypertension   ? Osteoarthritis   ? Sleep apnea   ? ? ?Medications:  ? aspirin EC  81 mg Oral Daily  ? atorvastatin  80 mg Oral Daily  ? carvedilol  6.25 mg Oral BID WC  ? sodium chloride flush  3 mL Intravenous Q12H  ? ?Assessment: ?76 year old male with history of HLD, HTN, obesity, and sleep apnea presents with chest tightness and recurrent chest pain x4 days. Pharmacy consulted for management of heparin drip in the setting of ACS. ? ?Baseline CBC stable (Plts 257). APTT/INR collected. ? ?Goal of Therapy:  ?Heparin level 0.3-0.7 units/ml ?Monitor platelets by anticoagulation protocol: Yes ?  ?Plan:  ?3/5: HL @ 0407 = 0.43, therapeutic X 2  ?Will continue pt on current rate and recheck HL on 3/6 with AM labs.  ? ?Shayann Garbutt D, PharmD ?Clinical Pharmacist ?10/21/2021 ?5:00 AM ? ? ?

## 2021-10-21 NOTE — Progress Notes (Incomplete)
{  Select Note:[id number]} 

## 2021-10-21 NOTE — Progress Notes (Signed)
*  PRELIMINARY RESULTS* ?Echocardiogram ?2D Echocardiogram has been performed. ? ?Alan Stewart ?10/21/2021, 12:38 PM ?

## 2021-10-21 NOTE — Progress Notes (Signed)
? ?Progress Note ? ?Patient Name: Alan Stewart ?Date of Encounter: 10/21/2021 ? ?[facility] Cardiologist: None  ? ?Subjective  ? ?Currently feeling well without chest pain or shortness of breath ? ?Inpatient Medications  ?  ?Scheduled Meds: ? aspirin EC  81 mg Oral Daily  ? atorvastatin  80 mg Oral Daily  ? carvedilol  6.25 mg Oral BID WC  ? sodium chloride flush  3 mL Intravenous Q12H  ? ?Continuous Infusions: ? heparin 1,250 Units/hr (10/21/21 0458)  ? ?PRN Meds: ?acetaminophen, nitroGLYCERIN, ondansetron (ZOFRAN) IV  ? ?Vital Signs  ?  ?Vitals:  ? 10/20/21 2121 10/21/21 0000 10/21/21 0419 10/21/21 5916  ?BP: 139/74 (!) 143/76 (!) 147/80 (!) 146/97  ?Pulse: (!) 55 (!) 51 65 66  ?Resp: '18 17 18 16  '$ ?Temp: 98.4 ?F (36.9 ?C) 98.2 ?F (36.8 ?C) 98.3 ?F (36.8 ?C) 97.6 ?F (36.4 ?C)  ?TempSrc:  Oral Oral   ?SpO2: 93% 95% 98% 96%  ?Weight:      ?Height:      ? ? ?Intake/Output Summary (Last 24 hours) at 10/21/2021 1136 ?Last data filed at 10/21/2021 0900 ?Gross per 24 hour  ?Intake 541.52 ml  ?Output --  ?Net 541.52 ml  ? ?Last 3 Weights 10/20/2021 10/20/2021 05/29/2017  ?Weight (lbs) 272 lb 8 oz 247 lb 240 lb  ?Weight (kg) 123.605 kg 112.038 kg 108.863 kg  ?   ? ?Telemetry  ?  ?Sinus rhythm - Personally Reviewed ? ?ECG  ?  ?None new - Personally Reviewed ? ?Physical Exam  ? ?GEN: No acute distress.   ?Neck: No JVD ?Cardiac: RRR, no murmurs, rubs, or gallops.  ?Respiratory: Clear to auscultation bilaterally. ?GI: Soft, nontender, non-distended  ?MS: No edema; No deformity. ?Neuro:  Nonfocal  ?Psych: Normal affect  ? ?Labs  ?  ?High Sensitivity Troponin:   ?Recent Labs  ?Lab 10/20/21 ?1142 10/20/21 ?1616 10/20/21 ?2132 10/21/21 ?0407 10/21/21 ?1005  ?TROPONINIHS 3,846* 2,680* 6,599* 2,938* 1,764*  ?   ?Chemistry ?Recent Labs  ?Lab 10/20/21 ?3570 10/21/21 ?0407  ?NA 139 134*  ?K 4.1 3.7  ?CL 106 103  ?CO2 23 25  ?GLUCOSE 141* 140*  ?BUN 12 12  ?CREATININE 0.98 0.97  ?CALCIUM 9.4 8.9  ?MG  --  1.9  ?PROT 7.2  --   ?ALBUMIN 3.7   --   ?AST 34  --   ?ALT 19  --   ?ALKPHOS 79  --   ?BILITOT 0.7  --   ?GFRNONAA >60 >60  ?ANIONGAP 10 6  ?  ?Lipids  ?Recent Labs  ?Lab 10/20/21 ?1142  ?CHOL 180  ?TRIG 171*  ?HDL 34*  ?LDLCALC 112*  ?CHOLHDL 5.3  ?  ?Hematology ?Recent Labs  ?Lab 10/20/21 ?1779 10/21/21 ?0407  ?WBC 6.2 5.2  ?RBC 5.42 5.24  ?HGB 15.8 15.4  ?HCT 47.9 46.1  ?MCV 88.4 88.0  ?MCH 29.2 29.4  ?MCHC 33.0 33.4  ?RDW 13.7 13.9  ?PLT 257 221  ? ?Thyroid  ?Recent Labs  ?Lab 10/20/21 ?1142  ?TSH 2.044  ?  ?BNPNo results for input(s): BNP, PROBNP in the last 168 hours.  ?DDimer No results for input(s): DDIMER in the last 168 hours.  ? ?Radiology  ?  ?DG Chest 2 View ? ?Result Date: 10/20/2021 ?CLINICAL DATA:  Chest pain on off for 4 days EXAM: CHEST - 2 VIEW COMPARISON:  11/16/2008 FINDINGS: Mild lingular scarring. No focal consolidation. No pleural effusion or pneumothorax. Heart and mediastinal contours are unremarkable. No acute osseous  abnormality. Diffuse thoracic spine spondylosis. IMPRESSION: No active cardiopulmonary disease. Electronically Signed   By: Kathreen Devoid M.D.   On: 10/20/2021 09:51   ? ?Cardiac Studies  ? ?TTE pending ? ?Patient Profile  ?   ?76 y.o. male with a history of hypertension, hyperlipidemia, prediabetes, obesity, obstructive sleep apnea presented to the hospital with chest pain found to have non-STEMI. ? ?Assessment & Plan  ?  ?1.  Non-STEMI: Currently on IV heparin.  Troponin peak at 4053.  Chest pain free after nitroglycerin.  Continue aspirin 81 mg, Lipitor 80 mg, carvedilol 6.25 mg twice daily.  Calani Gick need left heart catheterization tomorrow. ? ?2.  Hypertension: Elevated today.  We Deaveon Schoen add losartan 25 mg. ? ?3.  Hyperlipidemia: Goal LDL less than 70.  Continue atorvastatin 80 mg. ? ?4.  Obesity: Diet and exercise encouraged ?   ? ?For questions or updates, please contact [place] ?Please consult [email or web address] for contact info under  ? ?  ?   ?Signed, ?Marshell Rieger Meredith Leeds, MD  ?10/21/2021, 11:36 AM   ? ?

## 2021-10-21 NOTE — Progress Notes (Signed)
?Progress Note ? ? ?Patient: Alan Stewart EVO:[id number] DOB: 09/04/1945 DOA: 10/20/2021     1 ?DOS: the patient was seen and examined on 10/21/2021 ?  ?Brief hospital course: ?Mr. Alan Stewart is a 76 yo male with PMH obesity, HTN, HLD, hypothyroidism, OSA who presented to the ER with intermittent substernal chest pain radiating to his left arm.  He endorsed symptoms have been intermittent over the past 1 week.  He states approximately 4 episodes lasting approximately 5 minutes at a time.  Stopping to rest does help and precipitating factors do include some physical exertion such as cutting the grass however he also states symptoms occurred at rest as well.  He endorsed associated diaphoresis and belching but denied any nausea or vomiting.  He denies any prior history of similar.  ?Last night around 12:30 AM he could not sleep due to having chest pain and finally presented to the hospital this morning for further evaluation. ?He does endorse a family history of heart disease but could not elaborate too much into this. ?He no longer uses CPAP for his history of OSA. ?He quit smoking at 76 years old and quit drinking in 1984.  He denied any illicit drug use. ? ?Workup in the ER was notable for troponin 1,497 initially. EKG showed 1 mm ST depression in V3 - V6.  ?He was loaded with aspirin 324 mg and started on a heparin drip. Cardiology was also consulted on admission.  ? ?Assessment and Plan: ?* NSTEMI (non-ST elevated myocardial infarction) ([place]) ?- clinically consistent with NSTEMI. Presented with SS chest pain, diaphoresis, belching. Multiple risk factors including obesity, HTN, HLD, family history ?- remains CP free; did have some slight sensation of sweating episode this am but no associated CP with it ?- initial troponin 1497; appears to have peaked at 4,253.  ?- EKG shows ~ 1 mm ST depression in V3-V6.  ?- s/p asa load in ER; continue daily  ?- continue heparin drip ?- cardiology following; tentative plan for cath on  Monday. TIMI score 5 points ?- NTG SL PRN ?- follow up echo ?- obtain echo  ? ?Obesity (BMI 30-39.9) ?- Complicates overall prognosis and care ?- Body mass index is 38.69 kg/m?. ?- Weight Loss and Dietary Counseling given ? ?Hypothyroidism ?- TSH: 2.044 ?- Patient states he is no longer on Synthroid for quite some time ?- Given normal TSH, will hold off on any Synthroid ? ?Hyperlipidemia ?- Follow-up lipid panel: LDL 112 ?- Increase home statin to high intensity ? ?Sleep apnea ?- patient states no longer on nightly CPAP ?- will need further lifestyle modification including weight loss ?-Outpatient sleep study recommended ? ?Hypertension ?- Hold HCTZ ?- Cardiology starting Coreg.  Follow-up echo as well ? ? ? ? ? ?Subjective: No events overnight.  Remains overall chest pain-free.  Did have very mild episode this morning of some sweating but did not have any chest pain associated with this.  Plan remains for heart catheterization on Monday.  He had no other questions. ? ?Physical Exam: ?Vitals:  ? 10/20/21 2121 10/21/21 0000 10/21/21 0419 10/21/21 2993  ?BP: 139/74 (!) 143/76 (!) 147/80 (!) 146/97  ?Pulse: (!) 55 (!) 51 65 66  ?Resp: '18 17 18 16  '$ ?Temp: 98.4 ?F (36.9 ?C) 98.2 ?F (36.8 ?C) 98.3 ?F (36.8 ?C) 97.6 ?F (36.4 ?C)  ?TempSrc:  Oral Oral   ?SpO2: 93% 95% 98% 96%  ?Weight:      ?Height:      ? ?Physical Exam ?  Constitutional:   ?   General: He is not in acute distress. ?   Appearance: Normal appearance. He is obese.  ?HENT:  ?   Head: Normocephalic and atraumatic.  ?   Mouth/Throat:  ?   Mouth: Mucous membranes are moist.  ?Eyes:  ?   Extraocular Movements: Extraocular movements intact.  ?Cardiovascular:  ?   Rate and Rhythm: Normal rate and regular rhythm.  ?   Heart sounds: Normal heart sounds.  ?Pulmonary:  ?   Effort: Pulmonary effort is normal. No respiratory distress.  ?   Breath sounds: Normal breath sounds. No wheezing.  ?Abdominal:  ?   General: Bowel sounds are normal. There is no distension.  ?    Palpations: Abdomen is soft.  ?   Tenderness: There is no abdominal tenderness.  ?Musculoskeletal:     ?   General: Swelling present. Normal range of motion.  ?   Cervical back: Normal range of motion and neck supple.  ?   Comments: Improved swelling, 1+ bilateral lower extremity  ?Skin: ?   General: Skin is warm and dry.  ?Neurological:  ?   General: No focal deficit present.  ?   Mental Status: He is alert.  ?Psychiatric:     ?   Mood and Affect: Mood normal.     ?   Behavior: Behavior normal.  ? ? ? ?Data Reviewed: ? ?Results for orders placed or performed during the hospital encounter of 10/20/21 (from the past 24 hour(s))  ?Troponin I (High Sensitivity)     Status: Abnormal  ? Collection Time: 10/20/21  4:16 PM  ?Result Value Ref Range  ? Troponin I (High Sensitivity) 2,680 (HH) <18 ng/L  ?Resp Panel by RT-PCR (Flu A&B, Covid) Nasopharyngeal Swab     Status: None  ? Collection Time: 10/20/21  4:42 PM  ? Specimen: Nasopharyngeal Swab; Nasopharyngeal(NP) swabs in vial transport medium  ?Result Value Ref Range  ? SARS Coronavirus 2 by RT PCR NEGATIVE NEGATIVE  ? Influenza A by PCR NEGATIVE NEGATIVE  ? Influenza B by PCR NEGATIVE NEGATIVE  ?Heparin level (unfractionated)     Status: None  ? Collection Time: 10/20/21  8:17 PM  ?Result Value Ref Range  ? Heparin Unfractionated 0.30 0.30 - 0.70 IU/mL  ?Troponin I (High Sensitivity)     Status: Abnormal  ? Collection Time: 10/20/21  9:32 PM  ?Result Value Ref Range  ? Troponin I (High Sensitivity) 4,253 (HH) <18 ng/L  ?Basic metabolic panel     Status: Abnormal  ? Collection Time: 10/21/21  4:07 AM  ?Result Value Ref Range  ? Sodium 134 (L) 135 - 145 mmol/L  ? Potassium 3.7 3.5 - 5.1 mmol/L  ? Chloride 103 98 - 111 mmol/L  ? CO2 25 22 - 32 mmol/L  ? Glucose, Bld 140 (H) 70 - 99 mg/dL  ? BUN 12 8 - 23 mg/dL  ? Creatinine, Ser 0.97 0.61 - 1.24 mg/dL  ? Calcium 8.9 8.9 - 10.3 mg/dL  ? GFR, Estimated >60 >60 mL/min  ? Anion gap 6 5 - 15  ?CBC with Differential/Platelet      Status: None  ? Collection Time: 10/21/21  4:07 AM  ?Result Value Ref Range  ? WBC 5.2 4.0 - 10.5 K/uL  ? RBC 5.24 4.22 - 5.81 MIL/uL  ? Hemoglobin 15.4 13.0 - 17.0 g/dL  ? HCT 46.1 39.0 - 52.0 %  ? MCV 88.0 80.0 - 100.0 fL  ? MCH 29.4 26.0 -  34.0 pg  ? MCHC 33.4 30.0 - 36.0 g/dL  ? RDW 13.9 11.5 - 15.5 %  ? Platelets 221 150 - 400 K/uL  ? nRBC 0.0 0.0 - 0.2 %  ? Neutrophils Relative % 56 %  ? Neutro Abs 2.9 1.7 - 7.7 K/uL  ? Lymphocytes Relative 32 %  ? Lymphs Abs 1.7 0.7 - 4.0 K/uL  ? Monocytes Relative 9 %  ? Monocytes Absolute 0.5 0.1 - 1.0 K/uL  ? Eosinophils Relative 2 %  ? Eosinophils Absolute 0.1 0.0 - 0.5 K/uL  ? Basophils Relative 1 %  ? Basophils Absolute 0.0 0.0 - 0.1 K/uL  ? Immature Granulocytes 0 %  ? Abs Immature Granulocytes 0.01 0.00 - 0.07 K/uL  ?Magnesium     Status: None  ? Collection Time: 10/21/21  4:07 AM  ?Result Value Ref Range  ? Magnesium 1.9 1.7 - 2.4 mg/dL  ?Heparin level (unfractionated)     Status: None  ? Collection Time: 10/21/21  4:07 AM  ?Result Value Ref Range  ? Heparin Unfractionated 0.43 0.30 - 0.70 IU/mL  ?Troponin I (High Sensitivity)     Status: Abnormal  ? Collection Time: 10/21/21  4:07 AM  ?Result Value Ref Range  ? Troponin I (High Sensitivity) 2,938 (HH) <18 ng/L  ?Troponin I (High Sensitivity)     Status: Abnormal  ? Collection Time: 10/21/21 10:05 AM  ?Result Value Ref Range  ? Troponin I (High Sensitivity) 1,764 (HH) <18 ng/L  ?  ?I have Reviewed nursing notes, Vitals, and Lab results since pt's last encounter. Pertinent lab results : see above ?I have ordered test including BMP, CBC, Mg ?I have reviewed the last note from staff over past 24 hours ?I have discussed pt's care plan and test results with nursing staff, case manager ? ? ?Family Communication: wife ? ?Disposition: ?Status is: Inpatient ?Remains inpatient appropriate because: Treatment as outlined in A&P ? ? Planned Discharge Destination:  Home ? ?Antimicrobials: ? ? ?Consultants: ?Cardiology ? ?Procedures:  ?Heart catheterization planned for 10/22/2021 ? ?DVT ppx:  ? ? ? ?  Code Status: Full Code  ? ? ?Author: ?Dwyane Dee, MD ?10/21/2021 11:57 AM ? ?For on call review [email or web address].  ? ?

## 2021-10-21 NOTE — H&P (View-Only) (Signed)
? ?Progress Note ? ?Patient Name: Alan Stewart ?Date of Encounter: 10/21/2021 ? ?[facility] Cardiologist: None  ? ?Subjective  ? ?Currently feeling well without chest pain or shortness of breath ? ?Inpatient Medications  ?  ?Scheduled Meds: ? aspirin EC  81 mg Oral Daily  ? atorvastatin  80 mg Oral Daily  ? carvedilol  6.25 mg Oral BID WC  ? sodium chloride flush  3 mL Intravenous Q12H  ? ?Continuous Infusions: ? heparin 1,250 Units/hr (10/21/21 0458)  ? ?PRN Meds: ?acetaminophen, nitroGLYCERIN, ondansetron (ZOFRAN) IV  ? ?Vital Signs  ?  ?Vitals:  ? 10/20/21 2121 10/21/21 0000 10/21/21 0419 10/21/21 6945  ?BP: 139/74 (!) 143/76 (!) 147/80 (!) 146/97  ?Pulse: (!) 55 (!) 51 65 66  ?Resp: '18 17 18 16  '$ ?Temp: 98.4 ?F (36.9 ?C) 98.2 ?F (36.8 ?C) 98.3 ?F (36.8 ?C) 97.6 ?F (36.4 ?C)  ?TempSrc:  Oral Oral   ?SpO2: 93% 95% 98% 96%  ?Weight:      ?Height:      ? ? ?Intake/Output Summary (Last 24 hours) at 10/21/2021 1136 ?Last data filed at 10/21/2021 0900 ?Gross per 24 hour  ?Intake 541.52 ml  ?Output --  ?Net 541.52 ml  ? ?Last 3 Weights 10/20/2021 10/20/2021 05/29/2017  ?Weight (lbs) 272 lb 8 oz 247 lb 240 lb  ?Weight (kg) 123.605 kg 112.038 kg 108.863 kg  ?   ? ?Telemetry  ?  ?Sinus rhythm - Personally Reviewed ? ?ECG  ?  ?None new - Personally Reviewed ? ?Physical Exam  ? ?GEN: No acute distress.   ?Neck: No JVD ?Cardiac: RRR, no murmurs, rubs, or gallops.  ?Respiratory: Clear to auscultation bilaterally. ?GI: Soft, nontender, non-distended  ?MS: No edema; No deformity. ?Neuro:  Nonfocal  ?Psych: Normal affect  ? ?Labs  ?  ?High Sensitivity Troponin:   ?Recent Labs  ?Lab 10/20/21 ?1142 10/20/21 ?1616 10/20/21 ?2132 10/21/21 ?0407 10/21/21 ?1005  ?TROPONINIHS 0,388* 2,680* 8,280* 2,938* 1,764*  ?   ?Chemistry ?Recent Labs  ?Lab 10/20/21 ?0349 10/21/21 ?0407  ?NA 139 134*  ?K 4.1 3.7  ?CL 106 103  ?CO2 23 25  ?GLUCOSE 141* 140*  ?BUN 12 12  ?CREATININE 0.98 0.97  ?CALCIUM 9.4 8.9  ?MG  --  1.9  ?PROT 7.2  --   ?ALBUMIN 3.7   --   ?AST 34  --   ?ALT 19  --   ?ALKPHOS 79  --   ?BILITOT 0.7  --   ?GFRNONAA >60 >60  ?ANIONGAP 10 6  ?  ?Lipids  ?Recent Labs  ?Lab 10/20/21 ?1142  ?CHOL 180  ?TRIG 171*  ?HDL 34*  ?LDLCALC 112*  ?CHOLHDL 5.3  ?  ?Hematology ?Recent Labs  ?Lab 10/20/21 ?1791 10/21/21 ?0407  ?WBC 6.2 5.2  ?RBC 5.42 5.24  ?HGB 15.8 15.4  ?HCT 47.9 46.1  ?MCV 88.4 88.0  ?MCH 29.2 29.4  ?MCHC 33.0 33.4  ?RDW 13.7 13.9  ?PLT 257 221  ? ?Thyroid  ?Recent Labs  ?Lab 10/20/21 ?1142  ?TSH 2.044  ?  ?BNPNo results for input(s): BNP, PROBNP in the last 168 hours.  ?DDimer No results for input(s): DDIMER in the last 168 hours.  ? ?Radiology  ?  ?DG Chest 2 View ? ?Result Date: 10/20/2021 ?CLINICAL DATA:  Chest pain on off for 4 days EXAM: CHEST - 2 VIEW COMPARISON:  11/16/2008 FINDINGS: Mild lingular scarring. No focal consolidation. No pleural effusion or pneumothorax. Heart and mediastinal contours are unremarkable. No acute osseous  abnormality. Diffuse thoracic spine spondylosis. IMPRESSION: No active cardiopulmonary disease. Electronically Signed   By: Kathreen Devoid M.D.   On: 10/20/2021 09:51   ? ?Cardiac Studies  ? ?TTE pending ? ?Patient Profile  ?   ?76 y.o. male with a history of hypertension, hyperlipidemia, prediabetes, obesity, obstructive sleep apnea presented to the hospital with chest pain found to have non-STEMI. ? ?Assessment & Plan  ?  ?1.  Non-STEMI: Currently on IV heparin.  Troponin peak at 4053.  Chest pain free after nitroglycerin.  Continue aspirin 81 mg, Lipitor 80 mg, carvedilol 6.25 mg twice daily.  Bette Brienza need left heart catheterization tomorrow. ? ?2.  Hypertension: Elevated today.  We Denetria Luevanos add losartan 25 mg. ? ?3.  Hyperlipidemia: Goal LDL less than 70.  Continue atorvastatin 80 mg. ? ?4.  Obesity: Diet and exercise encouraged ?   ? ?For questions or updates, please contact [place] ?Please consult [email or web address] for contact info under  ? ?  ?   ?Signed, ?Jazzy Parmer Meredith Leeds, MD  ?10/21/2021, 11:36 AM   ? ?

## 2021-10-22 ENCOUNTER — Other Ambulatory Visit (HOSPITAL_COMMUNITY): Payer: Self-pay

## 2021-10-22 ENCOUNTER — Telehealth: Payer: Self-pay | Admitting: Cardiovascular Disease

## 2021-10-22 ENCOUNTER — Encounter: Payer: Self-pay | Admitting: Internal Medicine

## 2021-10-22 ENCOUNTER — Encounter
Admission: EM | Disposition: A | Discharge status: Home / Self Care | Payer: Self-pay | Source: Ambulatory Visit | Attending: Internal Medicine

## 2021-10-22 DIAGNOSIS — R7303 Prediabetes: Secondary | ICD-10-CM

## 2021-10-22 DIAGNOSIS — I2510 Atherosclerotic heart disease of native coronary artery without angina pectoris: Secondary | ICD-10-CM

## 2021-10-22 DIAGNOSIS — Z9861 Coronary angioplasty status: Secondary | ICD-10-CM

## 2021-10-22 HISTORY — PX: CORONARY STENT INTERVENTION: CATH118234

## 2021-10-22 HISTORY — PX: LEFT HEART CATH AND CORONARY ANGIOGRAPHY: CATH118249

## 2021-10-22 LAB — CBC WITH DIFFERENTIAL/PLATELET
Abs Immature Granulocytes: 0.01 10*3/uL (ref 0.00–0.07)
Basophils Absolute: 0 10*3/uL (ref 0.0–0.1)
Basophils Relative: 1 %
Eosinophils Absolute: 0.1 10*3/uL (ref 0.0–0.5)
Eosinophils Relative: 3 %
HCT: 44.2 % (ref 39.0–52.0)
Hemoglobin: 14.7 g/dL (ref 13.0–17.0)
Immature Granulocytes: 0 %
Lymphocytes Relative: 32 %
Lymphs Abs: 1.7 10*3/uL (ref 0.7–4.0)
MCH: 29.6 pg (ref 26.0–34.0)
MCHC: 33.3 g/dL (ref 30.0–36.0)
MCV: 88.9 fL (ref 80.0–100.0)
Monocytes Absolute: 0.5 10*3/uL (ref 0.1–1.0)
Monocytes Relative: 10 %
Neutro Abs: 2.9 10*3/uL (ref 1.7–7.7)
Neutrophils Relative %: 54 %
Platelets: 210 10*3/uL (ref 150–400)
RBC: 4.97 MIL/uL (ref 4.22–5.81)
RDW: 14.1 % (ref 11.5–15.5)
WBC: 5.3 10*3/uL (ref 4.0–10.5)
nRBC: 0 % (ref 0.0–0.2)

## 2021-10-22 LAB — BASIC METABOLIC PANEL
Anion gap: 9 (ref 5–15)
BUN: 17 mg/dL (ref 8–23)
CO2: 24 mmol/L (ref 22–32)
Calcium: 8.8 mg/dL — ABNORMAL LOW (ref 8.9–10.3)
Chloride: 105 mmol/L (ref 98–111)
Creatinine, Ser: 1.09 mg/dL (ref 0.61–1.24)
GFR, Estimated: >60 mL/min (ref >60)
Glucose, Bld: 118 mg/dL — ABNORMAL HIGH (ref 70–99)
Potassium: 4 mmol/L (ref 3.5–5.1)
Sodium: 138 mmol/L (ref 135–145)

## 2021-10-22 LAB — HEMOGLOBIN A1C
Hgb A1c MFr Bld: 6.1 % — ABNORMAL HIGH (ref 4.8–5.6)
Mean Plasma Glucose: 128 mg/dL

## 2021-10-22 LAB — MAGNESIUM: Magnesium: 2.1 mg/dL (ref 1.7–2.4)

## 2021-10-22 LAB — POCT ACTIVATED CLOTTING TIME
Activated Clotting Time: 426 seconds
Activated Clotting Time: 365 seconds

## 2021-10-22 LAB — TROPONIN I (HIGH SENSITIVITY): Troponin I (High Sensitivity): 1372 ng/L (ref <18)

## 2021-10-22 LAB — HEPARIN LEVEL (UNFRACTIONATED): Heparin Unfractionated: 0.3 IU/mL (ref 0.30–0.70)

## 2021-10-22 SURGERY — LEFT HEART CATH AND CORONARY ANGIOGRAPHY
Anesthesia: Moderate Sedation

## 2021-10-22 MED ORDER — IOHEXOL 300 MG/ML  SOLN
INTRAMUSCULAR | Status: DC | PRN
  Administered 2021-10-22: 160 mL

## 2021-10-22 MED ORDER — SODIUM CHLORIDE 0.9 % WEIGHT BASED INFUSION
3.0000 mL/kg/h | INTRAVENOUS | Status: DC
  Administered 2021-10-22: 3 mL/kg/h via INTRAVENOUS

## 2021-10-22 MED ORDER — FENTANYL CITRATE (PF) 100 MCG/2ML IJ SOLN
INTRAMUSCULAR | Status: DC | PRN
  Administered 2021-10-22: 50 ug via INTRAVENOUS

## 2021-10-22 MED ORDER — HEPARIN SODIUM (PORCINE) 1000 UNIT/ML IJ SOLN
INTRAMUSCULAR | Status: AC
  Filled 2021-10-22: qty 10

## 2021-10-22 MED ORDER — TICAGRELOR 90 MG PO TABS: 90.0000 mg | ORAL_TABLET | Freq: Two times a day (BID) | ORAL | 5 refills | Status: DC

## 2021-10-22 MED ORDER — CARVEDILOL 6.25 MG PO TABS
6.2500 mg | ORAL_TABLET | Freq: Two times a day (BID) | ORAL | Status: DC
Start: 2021-10-22 — End: 2021-10-22
  Filled 2021-10-22 (×2): qty 1

## 2021-10-22 MED ORDER — HEPARIN (PORCINE) IN NACL 1000-0.9 UT/500ML-% IV SOLN
INTRAVENOUS | Status: DC | PRN
  Administered 2021-10-22 (×3): 500 mL

## 2021-10-22 MED ORDER — TICAGRELOR 90 MG PO TABS: 90.0000 mg | ORAL_TABLET | Freq: Two times a day (BID) | ORAL | Status: DC

## 2021-10-22 MED ORDER — ASPIRIN 81 MG PO CHEW
81.0000 mg | CHEWABLE_TABLET | ORAL | Status: AC
  Administered 2021-10-22: 81 mg via ORAL

## 2021-10-22 MED ORDER — ASPIRIN 81 MG PO TBEC: 81.0000 mg | DELAYED_RELEASE_TABLET | Freq: Every day | ORAL | 11 refills | Status: AC

## 2021-10-22 MED ORDER — LIDOCAINE HCL (PF) 1 % IJ SOLN
INTRAMUSCULAR | Status: DC | PRN
  Administered 2021-10-22: 2 mL

## 2021-10-22 MED ORDER — SODIUM CHLORIDE 0.9 % WEIGHT BASED INFUSION: 1.0000 mL/kg/h | INTRAVENOUS | Status: DC

## 2021-10-22 MED ORDER — MIDAZOLAM HCL 2 MG/2ML IJ SOLN
INTRAMUSCULAR | Status: AC
  Filled 2021-10-22: qty 2

## 2021-10-22 MED ORDER — ATORVASTATIN CALCIUM 80 MG PO TABS: 80.0000 mg | ORAL_TABLET | Freq: Every day | ORAL | 3 refills | Status: AC

## 2021-10-22 MED ORDER — HEPARIN SODIUM (PORCINE) 1000 UNIT/ML IJ SOLN
INTRAMUSCULAR | Status: DC | PRN
  Administered 2021-10-22: 5000 [IU] via INTRAVENOUS
  Administered 2021-10-22: 6000 [IU] via INTRAVENOUS

## 2021-10-22 MED ORDER — ASPIRIN 81 MG PO CHEW
CHEWABLE_TABLET | ORAL | Status: AC
  Filled 2021-10-22: qty 1

## 2021-10-22 MED ORDER — CARVEDILOL 12.5 MG PO TABS
12.5000 mg | ORAL_TABLET | Freq: Two times a day (BID) | ORAL | Status: DC
Start: 2021-10-22 — End: 2021-10-22

## 2021-10-22 MED ORDER — LIDOCAINE HCL 1 % IJ SOLN
INTRAMUSCULAR | Status: AC
Start: 2021-10-22 — End: ?
  Filled 2021-10-22: qty 20

## 2021-10-22 MED ORDER — TICAGRELOR 90 MG PO TABS
ORAL_TABLET | ORAL | Status: AC
  Filled 2021-10-22: qty 2

## 2021-10-22 MED ORDER — CARVEDILOL 6.25 MG PO TABS: 6.2500 mg | ORAL_TABLET | Freq: Two times a day (BID) | ORAL | 3 refills | Status: DC

## 2021-10-22 MED ORDER — NITROGLYCERIN 0.4 MG SL SUBL
0.4000 mg | SUBLINGUAL_TABLET | SUBLINGUAL | 12 refills | Status: AC | PRN
Start: 2021-10-22 — End: ?

## 2021-10-22 MED ORDER — VERAPAMIL HCL 2.5 MG/ML IV SOLN
INTRAVENOUS | Status: DC | PRN
  Administered 2021-10-22: 2.5 mg via INTRA_ARTERIAL

## 2021-10-22 MED ORDER — MIDAZOLAM HCL 2 MG/2ML IJ SOLN
INTRAMUSCULAR | Status: DC | PRN
  Administered 2021-10-22: 1 mg via INTRAVENOUS

## 2021-10-22 MED ORDER — VERAPAMIL HCL 2.5 MG/ML IV SOLN
INTRAVENOUS | Status: AC
  Filled 2021-10-22: qty 2

## 2021-10-22 MED ORDER — SODIUM CHLORIDE 0.9 % IV SOLN: INTRAVENOUS | Status: DC

## 2021-10-22 MED ORDER — FENTANYL CITRATE (PF) 100 MCG/2ML IJ SOLN
INTRAMUSCULAR | Status: AC
  Filled 2021-10-22: qty 2

## 2021-10-22 MED ORDER — SODIUM CHLORIDE 0.9% FLUSH: 3.0000 mL | Freq: Two times a day (BID) | INTRAVENOUS | Status: DC

## 2021-10-22 MED ORDER — NITROGLYCERIN 1 MG/10 ML FOR IR/CATH LAB
INTRA_ARTERIAL | Status: DC | PRN
Start: 2021-10-22 — End: 2021-10-22
  Administered 2021-10-22 (×2): 200 ug via INTRACORONARY

## 2021-10-22 MED ORDER — SODIUM CHLORIDE 0.9% FLUSH: 3.0000 mL | INTRAVENOUS | Status: DC | PRN

## 2021-10-22 MED ORDER — HEPARIN (PORCINE) IN NACL 1000-0.9 UT/500ML-% IV SOLN
INTRAVENOUS | Status: AC
  Filled 2021-10-22: qty 1000

## 2021-10-22 MED ORDER — SODIUM CHLORIDE 0.9 % IV SOLN: 250.0000 mL | INTRAVENOUS | Status: DC | PRN

## 2021-10-22 MED ORDER — TICAGRELOR 90 MG PO TABS
ORAL_TABLET | ORAL | Status: DC | PRN
Start: 2021-10-22 — End: 2021-10-22
  Administered 2021-10-22: 180 mg via ORAL

## 2021-10-22 SURGICAL SUPPLY — 25 items
BALLN TREK RX 2.25X12 (BALLOONS) ×2
BALLN TREK RX 2.75X15 (BALLOONS) ×2
BALLN ~~LOC~~ TREK RX 2.75X12 (BALLOONS) ×2
BALLN ~~LOC~~ TREK RX 3.75X12 (BALLOONS) ×2
BALLOON TREK RX 2.25X12 (BALLOONS) IMPLANT
BALLOON TREK RX 2.75X15 (BALLOONS) IMPLANT
BALLOON ~~LOC~~ TREK RX 2.75X12 (BALLOONS) IMPLANT
BALLOON ~~LOC~~ TREK RX 3.75X12 (BALLOONS) IMPLANT
CATH INFINITI 5FR JK (CATHETERS) ×1 IMPLANT
CATH LAUNCHER 6FR EBU3.5 (CATHETERS) ×1 IMPLANT
CATH VISTA GUIDE 6FR JR4 (CATHETERS) ×1 IMPLANT
DEVICE RAD COMP TR BAND LRG (VASCULAR PRODUCTS) ×1 IMPLANT
DRAPE BRACHIAL (DRAPES) ×1 IMPLANT
GLIDESHEATH SLEND SS 6F .021 (SHEATH) ×1 IMPLANT
GUIDEWIRE INQWIRE 1.5J.035X260 (WIRE) IMPLANT
INQWIRE 1.5J .035X260CM (WIRE) ×2
KIT SYRINGE INJ CVI SPIKEX1 (MISCELLANEOUS) ×1 IMPLANT
PACK CARDIAC CATH (CUSTOM PROCEDURE TRAY) ×2 IMPLANT
PROTECTION STATION PRESSURIZED (MISCELLANEOUS) ×2
SET ATX SIMPLICITY (MISCELLANEOUS) ×1 IMPLANT
STATION PROTECTION PRESSURIZED (MISCELLANEOUS) IMPLANT
STENT ONYX FRONTIER 2.5X18 (Permanent Stent) ×1 IMPLANT
STENT ONYX FRONTIER 3.5X26 (Permanent Stent) ×1 IMPLANT
TUBING CIL FLEX 10 FLL-RA (TUBING) ×1 IMPLANT
WIRE RUNTHROUGH .014X180CM (WIRE) ×1 IMPLANT

## 2021-10-22 NOTE — Interval H&P Note (Signed)
Cath Lab Visit (complete for each Cath Lab visit) ? ?Clinical Evaluation Leading to the Procedure:  ? ?ACS: Yes.   ? ?Non-ACS:  n/a ? ? ? ?History and Physical Interval Note: ? ?10/22/2021 ?7:52 AM ? ?Alan Stewart  has presented today for surgery, with the diagnosis of chest pain.  The various methods of treatment have been discussed with the patient and family. After consideration of risks, benefits and other options for treatment, the patient has consented to  Procedure(s): ?LEFT HEART CATH AND CORONARY ANGIOGRAPHY (N/A) as a surgical intervention.  The patient's history has been reviewed, patient examined, no change in status, stable for surgery.  I have reviewed the patient's chart and labs.  Questions were answered to the patient's satisfaction.   ? ? ?Kathlyn [place] ? ? ?

## 2021-10-22 NOTE — TOC CM/SW Note (Signed)
Patient has orders to discharge home today. Chart reviewed. PCP is Ramonita Lab, MD. On room air. No wounds. Pharmacist gave Brilinta coupon card. No TOC needs identified. CSW signing off. ? ?Dayton Scrape, [place] ?[phone] ? ?

## 2021-10-22 NOTE — Telephone Encounter (Signed)
TCM- patient currently admitted. ?Await scheduling. ?

## 2021-10-22 NOTE — TOC Benefit Eligibility Note (Signed)
Patient Advocate Encounter ? ?Insurance verification completed.   ? ?The patient is currently admitted and upon discharge could be taking Brilinta 90 mg. ? ?The current 30 day co-pay is $45.  ? ?The patient is insured through [facility]  ? ? ?Sharlette Dense, CPhT ?Pharmacy Patient Advocate Specialist ?[place] Patient Advocate Team ?Direct Number: [phone]  Fax: [phone]  ?

## 2021-10-22 NOTE — Consult Note (Signed)
Counseled patient on the importance of antiplatelet therapy. Co-pay for Ticagrelor is 45 dollars. Patient is aware. Pt was given a free 30 day coupon. Pt was counseled on how to take the medications and side effects to be aware of.  ? ?Thanks,  ? ?Eleonore Chiquito, PharmD, BCPS ?

## 2021-10-22 NOTE — Assessment & Plan Note (Signed)
-   A1c 6.1% ?- lifestyle changes and weight loss were strongly recommended ?

## 2021-10-22 NOTE — Telephone Encounter (Signed)
Alan Stewart is out for the next week.  It looks like his 3/16 PM is already full.  I can see him 3/15 @ 8:50, though. ?

## 2021-10-22 NOTE — Progress Notes (Signed)
ANTICOAGULATION CONSULT NOTE ? ?Pharmacy Consult for IV heparin ?Indication: chest pain/ACS/STEMI ? ?No Known Allergies ? ?Patient Measurements: ?Height: '5\' 7"'$  (170.2 cm) ?Weight: 123.6 kg (272 lb 8 oz) ?IBW/kg (Calculated) : 66.1 ?Heparin Dosing Weight: 91.4 kg ? ?Vital Signs: ?Temp: 97.4 ?F (36.3 ?C) (03/06 0418) ?BP: 130/65 (03/06 0418) ?Pulse Rate: 56 (03/06 0418) ? ?Labs: ?Recent Labs  ?  10/20/21 ?6269 10/20/21 ?1142 10/20/21 ?1616 10/20/21 ?2017 10/20/21 ?2132 10/21/21 ?0407 10/21/21 ?1005 10/21/21 ?1550 10/21/21 ?2151 10/22/21 ?0409  ?HGB 15.8  --   --   --   --  15.4  --   --   --  14.7  ?HCT 47.9  --   --   --   --  46.1  --   --   --  44.2  ?PLT 257  --   --   --   --  221  --   --   --  210  ?APTT  --  27  --   --   --   --   --   --   --   --   ?LABPROT  --  13.1  --   --   --   --   --   --   --   --   ?INR  --  1.0  --   --   --   --   --   --   --   --   ?HEPARINUNFRC  --   --   --  0.30  --  0.43  --   --   --  0.30  ?CREATININE 0.98  --   --   --   --  0.97  --   --   --  1.09  ?TROPONINIHS 1,497* 1,615*   < >  --    < > 2,938*   < > 1,507* 1,441* 1,372*  ? < > = values in this interval not displayed.  ? ? ? ?Estimated Creatinine Clearance: 76.1 mL/min (by C-G formula based on SCr of 1.09 mg/dL). ? ? ?Medical History: ?Past Medical History:  ?Diagnosis Date  ? Hyperglycemia   ? Hyperlipidemia   ? Hypertension   ? Osteoarthritis   ? Sleep apnea   ? ? ?Medications:  ? aspirin EC  81 mg Oral Daily  ? atorvastatin  80 mg Oral Daily  ? carvedilol  6.25 mg Oral BID WC  ? sodium chloride flush  3 mL Intravenous Q12H  ? ?Assessment: ?76 year old male with history of HLD, HTN, obesity, and sleep apnea presents with chest tightness and recurrent chest pain x4 days. Pharmacy consulted for management of heparin drip in the setting of ACS. ? ?Baseline CBC stable (Plts 257). APTT/INR collected. ? ?Goal of Therapy:  ?Heparin level 0.3-0.7 units/ml ?Monitor platelets by anticoagulation protocol: Yes ?  ?Plan:   ?3/6:  HL @ 0409 = 0.3, therapeutic X 3 ?Will continue pt on current rate and recheck HL on 3/7 with AM labs.  ? ?Jhony Antrim D, PharmD ?Clinical Pharmacist ?10/22/2021 ?6:27 AM ? ? ?

## 2021-10-22 NOTE — Progress Notes (Signed)
Progress Note  Patient Name: Alan Stewart Date of Encounter: 10/22/2021  [facility] Cardiologist: None   Subjective   The patient underwent left heart catheterization by me this morning which showed severe two-vessel coronary artery disease involving the right coronary artery and left circumflex.  I performed successful PCI and drug-eluting stent placement to both vessels. He was seen postprocedure and I discussed the findings with his wife as well.  No chest pain or shortness of breath.  Inpatient Medications    Scheduled Meds:  aspirin       aspirin EC  81 mg Oral Daily   atorvastatin  80 mg Oral Daily   carvedilol  6.25 mg Oral BID WC   sodium chloride flush  3 mL Intravenous Q12H   sodium chloride flush  3 mL Intravenous Q12H   ticagrelor  90 mg Oral BID   Continuous Infusions:  sodium chloride     sodium chloride     PRN Meds: sodium chloride, acetaminophen, nitroGLYCERIN, ondansetron (ZOFRAN) IV, sodium chloride flush   Vital Signs    Vitals:   10/22/21 1030 10/22/21 1100 10/22/21 1130 10/22/21 1200  BP: 127/67 130/71 138/70 (!) 146/68  Pulse: (!) 55 (!) 51 (!) 55 (!) 59  Resp: '18 11 18 20  '$ Temp:      TempSrc:      SpO2: 96% 96% 94% 96%  Weight:      Height:        Intake/Output Summary (Last 24 hours) at 10/22/2021 1258 Last data filed at 10/21/2021 2042 Gross per 24 hour  Intake 557.21 ml  Output --  Net 557.21 ml    Last 3 Weights 10/22/2021 10/20/2021 10/20/2021  Weight (lbs) 270 lb 272 lb 8 oz 247 lb  Weight (kg) 122.471 kg 123.605 kg 112.038 kg      Telemetry    Sinus rhythm - Personally Reviewed  ECG    None new - Personally Reviewed  Physical Exam   GEN: No acute distress.   Neck: No JVD Cardiac: RRR, no murmurs, rubs, or gallops.  Respiratory: Clear to auscultation bilaterally. GI: Soft, nontender, non-distended  MS: No edema; No deformity. Neuro:  Nonfocal  Psych: Normal affect   Labs    High Sensitivity Troponin:   Recent  Labs  Lab 10/21/21 0407 10/21/21 1005 10/21/21 1550 10/21/21 2151 10/22/21 0409  TROPONINIHS 2,938* 1,764* 1,507* 1,441* 1,372*      Chemistry Recent Labs  Lab 10/20/21 0931 10/21/21 0407 10/22/21 0409  NA 139 134* 138  K 4.1 3.7 4.0  CL 106 103 105  CO2 '23 25 24  '$ GLUCOSE 141* 140* 118*  BUN '12 12 17  '$ CREATININE 0.98 0.97 1.09  CALCIUM 9.4 8.9 8.8*  MG  --  1.9 2.1  PROT 7.2  --   --   ALBUMIN 3.7  --   --   AST 34  --   --   ALT 19  --   --   ALKPHOS 79  --   --   BILITOT 0.7  --   --   GFRNONAA >60 >60 >60  ANIONGAP '10 6 9     '$ Lipids  Recent Labs  Lab 10/20/21 1142  CHOL 180  TRIG 171*  HDL 34*  LDLCALC 112*  CHOLHDL 5.3     Hematology Recent Labs  Lab 10/20/21 0931 10/21/21 0407 10/22/21 0409  WBC 6.2 5.2 5.3  RBC 5.42 5.24 4.97  HGB 15.8 15.4 14.7  HCT 47.9 46.1  44.2  MCV 88.4 88.0 88.9  MCH 29.2 29.4 29.6  MCHC 33.0 33.4 33.3  RDW 13.7 13.9 14.1  PLT 257 221 210    Thyroid  Recent Labs  Lab 10/20/21 1142  TSH 2.044     BNPNo results for input(s): BNP, PROBNP in the last 168 hours.  DDimer No results for input(s): DDIMER in the last 168 hours.   Radiology    CARDIAC CATHETERIZATION  Result Date: 10/22/2021   Prox RCA to Mid RCA lesion is 95% stenosed.   Dist RCA lesion is 30% stenosed.   Mid Cx lesion is 99% stenosed.   A drug-eluting stent was successfully placed using a STENT ONYX FRONTIER 3.5X26.   A drug-eluting stent was successfully placed using a STENT ONYX FRONTIER 2.5X18.   Post intervention, there is a 0% residual stenosis.   Post intervention, there is a 0% residual stenosis. 1.  Severe two-vessel coronary artery disease involving proximal/mid right coronary artery and mid left circumflex.  Minimal irregularities affecting the LAD. 2.  Left ventricular angiography was not performed.  EF was 50 to 55% by echo.  Normal left ventricular end-diastolic pressure. 3.  Successful angioplasty and drug-eluting stent placement to the  right coronary artery as well as left circumflex.  The left circumflex stent extended into OM 2. Recommendations: Dual antiplatelet therapy for at least 12 months. Aggressive treatment of risk factors. Possible discharge home later in the afternoon if the patient remains stable.   ECHOCARDIOGRAM COMPLETE  Result Date: 10/21/2021    ECHOCARDIOGRAM REPORT   Patient Name:   Alan Stewart Date of Exam: 10/21/2021 Medical Rec #:  [id number]      Height:       67.0 in Accession #:    [id number]     Weight:       272.5 lb Date of Birth:  1946/01/27       BSA:          2.306 m Patient Age:    76 years       BP:           129/76 mmHg Patient Gender: M              HR:           65 bpm. Exam Location:  [facility] Procedure: 2D Echo, Cardiac Doppler and Color Doppler Indications:     NSTEMI I21.4  History:         Patient has no prior history of Echocardiogram examinations.                  Risk Factors:Hypertension. HLD.  Sonographer:     Alyse Low Roar Referring Phys:  [place] Diagnosing Phys: Ida Rogue MD IMPRESSIONS  1. Left ventricular ejection fraction, by estimation, is 50 to 55%. The left ventricle has low normal function. The left ventricle has no regional wall motion abnormalities. The left ventricular internal cavity size was mildly dilated. Left ventricular diastolic parameters are consistent with Grade I diastolic dysfunction (impaired relaxation).  2. Right ventricular systolic function is normal. The right ventricular size is normal. Tricuspid regurgitation signal is inadequate for assessing PA pressure.  3. The mitral valve is normal in structure. No evidence of mitral valve regurgitation. No evidence of mitral stenosis.  4. The aortic valve was not well visualized. Aortic valve regurgitation is not visualized. No aortic stenosis is present.  5. The inferior vena cava is normal in size with greater than 50% respiratory variability,  suggesting right atrial pressure of 3 mmHg. FINDINGS  Left Ventricle:  Left ventricular ejection fraction, by estimation, is 50 to 55%. The left ventricle has low normal function. The left ventricle has no regional wall motion abnormalities. The left ventricular internal cavity size was mildly dilated. There is no left ventricular hypertrophy. Left ventricular diastolic parameters are consistent with Grade I diastolic dysfunction (impaired relaxation). Right Ventricle: The right ventricular size is normal. No increase in right ventricular wall thickness. Right ventricular systolic function is normal. Tricuspid regurgitation signal is inadequate for assessing PA pressure. Left Atrium: Left atrial size was normal in size. Right Atrium: Right atrial size was normal in size. Pericardium: There is no evidence of pericardial effusion. Mitral Valve: The mitral valve is normal in structure. No evidence of mitral valve regurgitation. No evidence of mitral valve stenosis. Tricuspid Valve: The tricuspid valve is normal in structure. Tricuspid valve regurgitation is mild . No evidence of tricuspid stenosis. Aortic Valve: The aortic valve was not well visualized. Aortic valve regurgitation is not visualized. No aortic stenosis is present. Aortic valve peak gradient measures 4.5 mmHg. Pulmonic Valve: The pulmonic valve was normal in structure. Pulmonic valve regurgitation is not visualized. No evidence of pulmonic stenosis. Aorta: The aortic root is normal in size and structure. Venous: The inferior vena cava is normal in size with greater than 50% respiratory variability, suggesting right atrial pressure of 3 mmHg. IAS/Shunts: No atrial level shunt detected by color flow Doppler.  LEFT VENTRICLE PLAX 2D LVIDd:         5.64 cm   Diastology LVIDs:         4.25 cm   LV e' medial:    3.81 cm/s LV PW:         0.97 cm   LV E/e' medial:  16.9 LV IVS:        1.13 cm   LV e' lateral:   5.55 cm/s LVOT diam:     2.00 cm   LV E/e' lateral: 11.6 LVOT Area:     3.14 cm  RIGHT VENTRICLE RV Mid diam:    2.94 cm  RV S prime:     12.00 cm/s TAPSE (M-mode): 2.1 cm LEFT ATRIUM             Index        RIGHT ATRIUM           Index LA diam:        3.40 cm 1.47 cm/m   RA Area:     11.60 cm LA Vol (A2C):   31.0 ml 13.44 ml/m  RA Volume:   23.40 ml  10.15 ml/m LA Vol (A4C):   37.7 ml 16.35 ml/m LA Biplane Vol: 35.2 ml 15.26 ml/m  AORTIC VALVE                 PULMONIC VALVE AV Area (Vmax): 2.41 cm     PV Vmax:        0.99 m/s AV Vmax:        106.00 cm/s  PV Peak grad:   3.9 mmHg AV Peak Grad:   4.5 mmHg     RVOT Peak grad: 2 mmHg LVOT Vmax:      81.40 cm/s  AORTA Ao Root diam: 2.80 cm MITRAL VALVE MV Area (PHT): 3.20 cm    SHUNTS MV Decel Time: 237 msec    Systemic Diam: 2.00 cm MV E velocity: 64.30 cm/s MV A velocity: 88.70 cm/s MV E/A ratio:  0.72 MV A Prime:    12.3 cm/s Ida Rogue MD Electronically signed by Ida Rogue MD Signature Date/Time: 10/21/2021/1:22:44 PM    Final     Cardiac Studies   TTE pending  Patient Profile     75 y.o. male with a history of hypertension, hyperlipidemia, prediabetes, obesity, obstructive sleep apnea presented to the hospital with chest pain found to have non-STEMI.  Assessment & Plan    1.  Non-STEMI: Status post PCI and drug-eluting stent placement to the LAD and left circumflex.  Continue aspirin 81 mg, Lipitor 80 mg, carvedilol 6.25 mg twice daily.  I added Brilinta 90 mg twice daily to be used for at least 1 year. I referred the patient to cardiac rehab. He can be discharged home in the afternoon if he remains stable.  2.  Hypertension: Blood pressure is reasonably controlled on carvedilol.  3.  Hyperlipidemia: Goal LDL less than 70.  Continue atorvastatin 80 mg.  4.  Obesity: Diet and exercise encouraged     I will arrange for follow-up in our office.  For questions or updates, please contact [place] Please consult [email or web address] for contact info under        Signed, Kathlyn Sacramento, MD  10/22/2021, 12:58 PM

## 2021-10-22 NOTE — Assessment & Plan Note (Addendum)
-   see NSTEMI and LHC on 10/22/21 with 2 DES ?- continue asa, statin, BB ?

## 2021-10-22 NOTE — Telephone Encounter (Signed)
Wellington Hampshire, MD  P Cv Div [place] Scheduling ?The patient will be discharged from [facility] likely in the afternoon today.  ?TCM follow-up needed with me or APP in 1 to 2 weeks.  ?

## 2021-10-22 NOTE — Discharge Summary (Signed)
Physician Discharge Summary   Patient: Alan Stewart MRN: [id number] DOB: May 28, 1946  Admit date:     10/20/2021  Discharge date: 10/22/21  Discharge Physician: Dwyane Dee   PCP: Adin Hector, MD   Recommendations at discharge:   Follow-up with cardiology Confirm no longer on Synthroid Consider repeat sleep study if warranted  Discharge Diagnoses: Principal Problem:   NSTEMI (non-ST elevated myocardial infarction) ([place]) Active Problems:   Hypertension   Sleep apnea   Hyperlipidemia   Hypothyroidism   Obesity (BMI 30-39.9)   CAD S/P percutaneous coronary angioplasty   Prediabetes  Resolved Problems:   * No resolved hospital problems. [facility] [place] Course: Alan Stewart is a 76 yo male with PMH obesity, HTN, HLD, hypothyroidism, OSA who presented to the ER with intermittent substernal chest pain radiating to his left arm.  He endorsed symptoms have been intermittent over the past 1 week.  He states approximately 4 episodes lasting approximately 5 minutes at a time.  Stopping to rest does help and precipitating factors do include some physical exertion such as cutting the grass however he also states symptoms occurred at rest as well.  He endorsed associated diaphoresis and belching but denied any nausea or vomiting.  He denies any prior history of similar.  Last night around 12:30 AM he could not sleep due to having chest pain and finally presented to the hospital this morning for further evaluation. He does endorse a family history of heart disease but could not elaborate too much into this. He no longer uses CPAP for his history of OSA. He quit smoking at 76 years old and quit drinking in 1984.  He denied any illicit drug use.  Workup in the ER was notable for troponin 1,497 initially. EKG showed 1 mm ST depression in V3 - V6.  He was loaded with aspirin 324 mg and started on a heparin drip. Cardiology was also consulted on admission.  He was taken for left heart cath on  10/22/2021.  1 DES placed to the proximal RCA/mid RCA which was 95% stenosed and 1 DES placed to the mid Cx which was 99% stenosed.  He was continued on aspirin and Brilinta at discharge.  1 year minimum DAPT recommended.  He will follow-up outpatient with cardiology as well.  Assessment and Plan: * NSTEMI (non-ST elevated myocardial infarction) ([place]) - clinically consistent with NSTEMI. Presented with SS chest pain, diaphoresis, belching. Multiple risk factors including obesity, HTN, HLD, family history - initial troponin 1497; appears to have peaked at 4,253.  - EKG shows ~ 1 mm ST depression in V3-V6.  - echo: EF 50-55%, Gr 1 DD - cardiology following; s/p cath on 3/6 - cath findings: "Prox RCA to Mid RCA lesion is 95% stenosed. Dist RCA lesion is 30% stenosed. Mid Cx lesion is 99% stenosed - continue asa and brilinta at discharge; minimum 1 year per cardiology - continue coreg - continue lipitor 80 mg daily   Prediabetes - A1c 6.1% - lifestyle changes and weight loss were strongly recommended  CAD S/P percutaneous coronary angioplasty - see NSTEMI and LHC on 10/22/21 with 2 DES - continue asa, statin, BB  Obesity (BMI 19-14.7) - Complicates overall prognosis and care - Body mass index is 38.69 kg/m. - Weight Loss and Dietary Counseling given  Hypothyroidism - TSH: 2.044 - Patient states he is no longer on Synthroid for quite some time - Given normal TSH, will hold off on any Synthroid  Hyperlipidemia - Follow-up lipid  panel: LDL 112 - Increase home statin to high intensity  Sleep apnea - patient states no longer on nightly CPAP - will need further lifestyle modification including weight loss -Outpatient sleep study recommended  Hypertension - Hold HCTZ - Cardiology starting Coreg.         Consultants: Cardiology Procedures performed: Heart cath, 10/22/21  Disposition: Home Diet recommendation:  Discharge Diet Orders (From admission, onward)     Start      Ordered   10/22/21 0000  Diet - low sodium heart healthy        10/22/21 1343           Cardiac diet DISCHARGE MEDICATION: Allergies as of 10/22/2021   No Known Allergies      Medication List     STOP taking these medications    fluorouracil 5 % cream Commonly known as: EFUDEX   hydrochlorothiazide 12.5 MG tablet Commonly known as: HYDRODIURIL   ibuprofen 200 MG tablet Commonly known as: ADVIL   levothyroxine 75 MCG tablet Commonly known as: SYNTHROID   lovastatin 40 MG tablet Commonly known as: MEVACOR   ranitidine 75 MG tablet Commonly known as: ZANTAC   traMADol 50 MG tablet Commonly known as: ULTRAM       TAKE these medications    aspirin 81 MG EC tablet Take 1 tablet (81 mg total) by mouth daily. Swallow whole.   atorvastatin 80 MG tablet Commonly known as: LIPITOR Take 1 tablet (80 mg total) by mouth daily. Start taking on: October 23, 2021 What changed:  medication strength how much to take   carvedilol 6.25 MG tablet Commonly known as: COREG Take 1 tablet (6.25 mg total) by mouth 2 (two) times daily with a meal.   nitroGLYCERIN 0.4 MG SL tablet Commonly known as: NITROSTAT Place 1 tablet (0.4 mg total) under the tongue every 5 (five) minutes as needed for chest pain.   ticagrelor 90 MG Tabs tablet Commonly known as: BRILINTA Take 1 tablet (90 mg total) by mouth 2 (two) times daily.        Follow-up Information     Minna Merritts, MD Follow up in 1 week(s).   Specialty: Cardiology Why: The doctor's office will call you with a follow up appointment. Contact information: [place] [address] [phone]                Discharge Exam: Danley Danker Weights   10/20/21 4888 10/20/21 1842 10/22/21 0714  Weight: 112 kg 123.6 kg 122.5 kg   Physical Exam Constitutional:      General: He is not in acute distress.    Appearance: Normal appearance. He is obese.  HENT:     Head: Normocephalic and  atraumatic.     Mouth/Throat:     Mouth: Mucous membranes are moist.  Eyes:     Extraocular Movements: Extraocular movements intact.  Cardiovascular:     Rate and Rhythm: Normal rate and regular rhythm.     Heart sounds: Normal heart sounds.  Pulmonary:     Effort: Pulmonary effort is normal. No respiratory distress.     Breath sounds: Normal breath sounds. No wheezing.  Abdominal:     General: Bowel sounds are normal. There is no distension.     Palpations: Abdomen is soft.     Tenderness: There is no abdominal tenderness.  Musculoskeletal:        General: Swelling present. Normal range of motion.     Cervical back: Normal  range of motion and neck supple.     Comments: Improved swelling, 1+ bilateral lower extremity  Skin:    General: Skin is warm and dry.  Neurological:     General: No focal deficit present.     Mental Status: He is alert.  Psychiatric:        Mood and Affect: Mood normal.        Behavior: Behavior normal.     Condition at discharge: stable  The results of significant diagnostics from this hospitalization (including imaging, microbiology, ancillary and laboratory) are listed below for reference.   Imaging Studies: DG Chest 2 View  Result Date: 10/20/2021 CLINICAL DATA:  Chest pain on off for 4 days EXAM: CHEST - 2 VIEW COMPARISON:  11/16/2008 FINDINGS: Mild lingular scarring. No focal consolidation. No pleural effusion or pneumothorax. Heart and mediastinal contours are unremarkable. No acute osseous abnormality. Diffuse thoracic spine spondylosis. IMPRESSION: No active cardiopulmonary disease. Electronically Signed   By: Kathreen Devoid M.D.   On: 10/20/2021 09:51   CARDIAC CATHETERIZATION  Result Date: 10/22/2021   Prox RCA to Mid RCA lesion is 95% stenosed.   Dist RCA lesion is 30% stenosed.   Mid Cx lesion is 99% stenosed.   A drug-eluting stent was successfully placed using a STENT ONYX FRONTIER 3.5X26.   A drug-eluting stent was successfully placed using a  STENT ONYX FRONTIER 2.5X18.   Post intervention, there is a 0% residual stenosis.   Post intervention, there is a 0% residual stenosis. 1.  Severe two-vessel coronary artery disease involving proximal/mid right coronary artery and mid left circumflex.  Minimal irregularities affecting the LAD. 2.  Left ventricular angiography was not performed.  EF was 50 to 55% by echo.  Normal left ventricular end-diastolic pressure. 3.  Successful angioplasty and drug-eluting stent placement to the right coronary artery as well as left circumflex.  The left circumflex stent extended into OM 2. Recommendations: Dual antiplatelet therapy for at least 12 months. Aggressive treatment of risk factors. Possible discharge home later in the afternoon if the patient remains stable.   ECHOCARDIOGRAM COMPLETE  Result Date: 10/21/2021    ECHOCARDIOGRAM REPORT   Patient Name:   Alan Stewart Date of Exam: 10/21/2021 Medical Rec #:  [id number]      Height:       67.0 in Accession #:    [id number]     Weight:       272.5 lb Date of Birth:  Aug 27, 1945       BSA:          2.306 m Patient Age:    24 years       BP:           129/76 mmHg Patient Gender: M              HR:           65 bpm. Exam Location:  [facility] Procedure: 2D Echo, Cardiac Doppler and Color Doppler Indications:     NSTEMI I21.4  History:         Patient has no prior history of Echocardiogram examinations.                  Risk Factors:Hypertension. HLD.  Sonographer:     Alyse Low Roar Referring Phys:  [place] Diagnosing Phys: Ida Rogue MD IMPRESSIONS  1. Left ventricular ejection fraction, by estimation, is 50 to 55%. The left ventricle has low normal function. The left ventricle has no regional  wall motion abnormalities. The left ventricular internal cavity size was mildly dilated. Left ventricular diastolic parameters are consistent with Grade I diastolic dysfunction (impaired relaxation).  2. Right ventricular systolic function is normal. The right ventricular size  is normal. Tricuspid regurgitation signal is inadequate for assessing PA pressure.  3. The mitral valve is normal in structure. No evidence of mitral valve regurgitation. No evidence of mitral stenosis.  4. The aortic valve was not well visualized. Aortic valve regurgitation is not visualized. No aortic stenosis is present.  5. The inferior vena cava is normal in size with greater than 50% respiratory variability, suggesting right atrial pressure of 3 mmHg. FINDINGS  Left Ventricle: Left ventricular ejection fraction, by estimation, is 50 to 55%. The left ventricle has low normal function. The left ventricle has no regional wall motion abnormalities. The left ventricular internal cavity size was mildly dilated. There is no left ventricular hypertrophy. Left ventricular diastolic parameters are consistent with Grade I diastolic dysfunction (impaired relaxation). Right Ventricle: The right ventricular size is normal. No increase in right ventricular wall thickness. Right ventricular systolic function is normal. Tricuspid regurgitation signal is inadequate for assessing PA pressure. Left Atrium: Left atrial size was normal in size. Right Atrium: Right atrial size was normal in size. Pericardium: There is no evidence of pericardial effusion. Mitral Valve: The mitral valve is normal in structure. No evidence of mitral valve regurgitation. No evidence of mitral valve stenosis. Tricuspid Valve: The tricuspid valve is normal in structure. Tricuspid valve regurgitation is mild . No evidence of tricuspid stenosis. Aortic Valve: The aortic valve was not well visualized. Aortic valve regurgitation is not visualized. No aortic stenosis is present. Aortic valve peak gradient measures 4.5 mmHg. Pulmonic Valve: The pulmonic valve was normal in structure. Pulmonic valve regurgitation is not visualized. No evidence of pulmonic stenosis. Aorta: The aortic root is normal in size and structure. Venous: The inferior vena cava is normal  in size with greater than 50% respiratory variability, suggesting right atrial pressure of 3 mmHg. IAS/Shunts: No atrial level shunt detected by color flow Doppler.  LEFT VENTRICLE PLAX 2D LVIDd:         5.64 cm   Diastology LVIDs:         4.25 cm   LV e' medial:    3.81 cm/s LV PW:         0.97 cm   LV E/e' medial:  16.9 LV IVS:        1.13 cm   LV e' lateral:   5.55 cm/s LVOT diam:     2.00 cm   LV E/e' lateral: 11.6 LVOT Area:     3.14 cm  RIGHT VENTRICLE RV Mid diam:    2.94 cm RV S prime:     12.00 cm/s TAPSE (M-mode): 2.1 cm LEFT ATRIUM             Index        RIGHT ATRIUM           Index LA diam:        3.40 cm 1.47 cm/m   RA Area:     11.60 cm LA Vol (A2C):   31.0 ml 13.44 ml/m  RA Volume:   23.40 ml  10.15 ml/m LA Vol (A4C):   37.7 ml 16.35 ml/m LA Biplane Vol: 35.2 ml 15.26 ml/m  AORTIC VALVE                 PULMONIC VALVE AV Area (Vmax): 2.41  cm     PV Vmax:        0.99 m/s AV Vmax:        106.00 cm/s  PV Peak grad:   3.9 mmHg AV Peak Grad:   4.5 mmHg     RVOT Peak grad: 2 mmHg LVOT Vmax:      81.40 cm/s  AORTA Ao Root diam: 2.80 cm MITRAL VALVE MV Area (PHT): 3.20 cm    SHUNTS MV Decel Time: 237 msec    Systemic Diam: 2.00 cm MV E velocity: 64.30 cm/s MV A velocity: 88.70 cm/s MV E/A ratio:  0.72 MV A Prime:    12.3 cm/s Ida Rogue MD Electronically signed by Ida Rogue MD Signature Date/Time: 10/21/2021/1:22:44 PM    Final     Microbiology: Results for orders placed or performed during the hospital encounter of 10/20/21  Resp Panel by RT-PCR (Flu A&B, Covid) Nasopharyngeal Swab     Status: None   Collection Time: 10/20/21  4:42 PM   Specimen: Nasopharyngeal Swab; Nasopharyngeal(NP) swabs in vial transport medium  Result Value Ref Range Status   SARS Coronavirus 2 by RT PCR NEGATIVE NEGATIVE Final    Comment: (NOTE) SARS-CoV-2 target nucleic acids are NOT DETECTED.  The SARS-CoV-2 RNA is generally detectable in upper respiratory specimens during the acute phase of infection.  The lowest concentration of SARS-CoV-2 viral copies this assay can detect is 138 copies/mL. A negative result does not preclude SARS-Cov-2 infection and should not be used as the sole basis for treatment or other patient management decisions. A negative result may occur with  improper specimen collection/handling, submission of specimen other than nasopharyngeal swab, presence of viral mutation(s) within the areas targeted by this assay, and inadequate number of viral copies(<138 copies/mL). A negative result must be combined with clinical observations, patient history, and epidemiological information. The expected result is Negative.  Fact Sheet for Patients:  EntrepreneurPulse.com.au  Fact Sheet for Healthcare Providers:  IncredibleEmployment.be  This test is no t yet approved or cleared by the [place] FDA and  has been authorized for detection and/or diagnosis of SARS-CoV-2 by FDA under an Emergency Use Authorization (EUA). This EUA will remain  in effect (meaning this test can be used) for the duration of the COVID-19 declaration under Section 564(b)(1) of the Act, 21 U.S.C.section 360bbb-3(b)(1), unless the authorization is terminated  or revoked sooner.       Influenza A by PCR NEGATIVE NEGATIVE Final   Influenza B by PCR NEGATIVE NEGATIVE Final    Comment: (NOTE) The Xpert Xpress SARS-CoV-2/FLU/RSV plus assay is intended as an aid in the diagnosis of influenza from Nasopharyngeal swab specimens and should not be used as a sole basis for treatment. Nasal washings and aspirates are unacceptable for Xpert Xpress SARS-CoV-2/FLU/RSV testing.  Fact Sheet for Patients: EntrepreneurPulse.com.au  Fact Sheet for Healthcare Providers: IncredibleEmployment.be  This test is not yet approved or cleared by the [place] FDA and has been authorized for detection and/or diagnosis of SARS-CoV-2 by FDA  under an Emergency Use Authorization (EUA). This EUA will remain in effect (meaning this test can be used) for the duration of the COVID-19 declaration under Section 564(b)(1) of the Act, 21 U.S.C. section 360bbb-3(b)(1), unless the authorization is terminated or revoked.  Performed at [facility], [address] [place], [place] [address]     Labs: CBC: Recent Labs  Lab 10/20/21 0931 10/21/21 0407 10/22/21 0409  WBC 6.2 5.2 5.3  NEUTROABS  --  2.9 2.9  HGB 15.8 15.4 14.7  HCT 47.9 46.1 44.2  MCV 88.4 88.0 88.9  PLT 257 221 118   Basic Metabolic Panel: Recent Labs  Lab 10/20/21 0931 10/21/21 0407 10/22/21 0409  NA 139 134* 138  K 4.1 3.7 4.0  CL 106 103 105  CO2 '23 25 24  '$ GLUCOSE 141* 140* 118*  BUN '12 12 17  '$ CREATININE 0.98 0.97 1.09  CALCIUM 9.4 8.9 8.8*  MG  --  1.9 2.1   Liver Function Tests: Recent Labs  Lab 10/20/21 0931  AST 34  ALT 19  ALKPHOS 79  BILITOT 0.7  PROT 7.2  ALBUMIN 3.7   CBG: No results for input(s): GLUCAP in the last 168 hours.  Discharge time spent: greater than 30 minutes.  Signed: Dwyane Dee, MD [facility] 10/22/2021

## 2021-10-22 NOTE — Progress Notes (Signed)
1500- Dr. Virgilio Belling and Dr. Fletcher Anon notified of slight bleeding at R. Wrist. Level 1.  ?MD stated to watch until 16:00 and then if no other bleeding patient can discharge.  ? ?15:50- Dr. Fletcher Anon came and visualized wrist.  ?Patient okay to discharge. Patient educated to come to ER if wrist becomes hard and swollen or any active bleeding in wrist. Patient educated on post heart cath restrictions. Patient educated on new medications and discharge AVS reviewed with patient and wife. No signs of distress.  ? ?

## 2021-10-22 NOTE — Telephone Encounter (Signed)
Alan Stewart - is it ok to add onto hospital day 3/16? ?

## 2021-10-23 NOTE — Telephone Encounter (Signed)
Scheduled and transferred to pam .  ?

## 2021-10-23 NOTE — Telephone Encounter (Signed)
Patient contacted regarding discharge from [facility] on 10/22/21. ? ?Patient understands to follow up with provider Murray Hodgkins NP on 10/31/21 at 08:50 am at [facility]. ?Patient understands discharge instructions? Yes ?Patient understands medications and regiment? Yes ? ?Patient states he had a wonderful team during his admission and spoke highly of nurses and providers during his stay. He states that he has all of his medications, no questions, and confirmed upcoming appointment. He was appreciative for the call with no further questions.  ?

## 2021-10-25 DIAGNOSIS — E7849 Other hyperlipidemia: Secondary | ICD-10-CM | POA: Diagnosis not present

## 2021-10-25 DIAGNOSIS — R739 Hyperglycemia, unspecified: Secondary | ICD-10-CM | POA: Diagnosis not present

## 2021-10-25 DIAGNOSIS — M159 Polyosteoarthritis, unspecified: Secondary | ICD-10-CM | POA: Diagnosis not present

## 2021-10-25 DIAGNOSIS — Z125 Encounter for screening for malignant neoplasm of prostate: Secondary | ICD-10-CM | POA: Diagnosis not present

## 2021-10-25 DIAGNOSIS — I10 Essential (primary) hypertension: Secondary | ICD-10-CM | POA: Diagnosis not present

## 2021-10-25 DIAGNOSIS — Z6841 Body Mass Index (BMI) 40.0 and over, adult: Secondary | ICD-10-CM | POA: Diagnosis not present

## 2021-10-25 DIAGNOSIS — E039 Hypothyroidism, unspecified: Secondary | ICD-10-CM | POA: Diagnosis not present

## 2021-10-31 ENCOUNTER — Other Ambulatory Visit: Payer: Self-pay

## 2021-10-31 ENCOUNTER — Encounter: Payer: Self-pay | Admitting: Nurse Practitioner

## 2021-10-31 ENCOUNTER — Ambulatory Visit: Payer: Medicare HMO | Admitting: Nurse Practitioner

## 2021-10-31 VITALS — BP 118/60 | HR 63 | Ht 67.0 in | Wt 272.0 lb

## 2021-10-31 DIAGNOSIS — E785 Hyperlipidemia, unspecified: Secondary | ICD-10-CM | POA: Diagnosis not present

## 2021-10-31 DIAGNOSIS — I2510 Atherosclerotic heart disease of native coronary artery without angina pectoris: Secondary | ICD-10-CM | POA: Diagnosis not present

## 2021-10-31 DIAGNOSIS — I10 Essential (primary) hypertension: Secondary | ICD-10-CM

## 2021-10-31 DIAGNOSIS — Z9861 Coronary angioplasty status: Secondary | ICD-10-CM | POA: Diagnosis not present

## 2021-10-31 DIAGNOSIS — I214 Non-ST elevation (NSTEMI) myocardial infarction: Secondary | ICD-10-CM

## 2021-10-31 DIAGNOSIS — G4730 Sleep apnea, unspecified: Secondary | ICD-10-CM | POA: Diagnosis not present

## 2021-10-31 NOTE — Patient Instructions (Signed)
Medication Instructions:  No changes at this time.  *If you need a refill on your cardiac medications before your next appointment, please call your pharmacy*   Lab Work: None  If you have labs (blood work) drawn today and your tests are completely normal, you will receive your results only by: MyChart Message (if you have MyChart) OR A paper copy in the mail If you have any lab test that is abnormal or we need to change your treatment, we will call you to review the results.   Testing/Procedures: None   Follow-Up: At [facility], you and your health needs are our priority.  As part of our continuing mission to provide you with exceptional heart care, we have created designated Provider Care Teams.  These Care Teams include your primary Cardiologist (physician) and Advanced Practice Providers (APPs -  Physician Assistants and Nurse Practitioners) who all work together to provide you with the care you need, when you need it.   Your next appointment:   1 month(s)  The format for your next appointment:   In Person  Provider:   Muhammad Arida, MD or Christopher Berge, NP   

## 2021-10-31 NOTE — Progress Notes (Signed)
? ? ?Office Visit  ?  ?Patient Name: Alan Stewart ?Date of Encounter: 10/31/2021 ? ?Primary Care Provider:  Adin Hector, MD ?Primary Cardiologist:  Kathlyn Sacramento, MD ? ?Chief Complaint  ?  ?76 year old male with a history of hypertension, hyperlipidemia, prediabetes, obesity, and obstructive sleep apnea, who presents for follow-up after recent admission for non-STEMI and stenting. ? ?Past Medical History  ?  ?Past Medical History:  ?Diagnosis Date  ? CAD (coronary artery disease)   ? a. 10/2021 NSTEMI/PCI: LM nl, LAD min irregs, LCX 76m(2.5x18 Onyx Frontier DES), OM1 nl, RCA 965m3.5x26 Onyx Frontier DES), 3000P ? Diastolic dysfunction   ? a. 10/2021 Echo: EF 50-55%, no rwma, Gr1 DD, nl RV fxn. No significant valvular dzs.  ? Hyperglycemia   ? Hyperlipidemia   ? Hypertension   ? Morbid obesity ([place]  ? Osteoarthritis   ? Sleep apnea   ? ?Past Surgical History:  ?Procedure Laterality Date  ? COLONOSCOPY N/A 10/09/2015  ? Procedure: COLONOSCOPY;  Surgeon: PaHulen LusterMD;  [facility];  Service: Gastroenterology;  Laterality: N/A;  ? CORONARY STENT INTERVENTION N/A 10/22/2021  ? Procedure: CORONARY STENT INTERVENTION;  Surgeon: ArWellington HampshireMD;  [facility];  Service: Cardiovascular;  Laterality: N/A;  ? LEFT HEART CATH AND CORONARY ANGIOGRAPHY N/A 10/22/2021  ? Procedure: LEFT HEART CATH AND CORONARY ANGIOGRAPHY;  Surgeon: ArWellington HampshireMD;  [facility];  Service: Cardiovascular;  Laterality: N/A;  ? ROTATOR CUFF REPAIR    ? ? ?Allergies ? ?No Known Allergies ? ?History of Present Illness  ?  ?89ear old male with above past medical history including hypertension, hyperlipidemia, prediabetes, obesity, obstructive sleep apnea, and recently diagnosed coronary artery disease.  [facility] with a 1 week history of intermittent chest pain, which was more persistent the night prior to arrival.  ECG showed inferolateral ST  and T changes while high-sensitivity troponin returned elevated and eventually peaked at 4253.  He was admitted underwent diagnostic catheterization which revealed severe mid left circumflex and mid RCA disease.  Both areas were successfully treated with a drug-eluting stent.  Echocardiogram showed an EF of 50 to 55% with grade 1 diastolic dysfunction.  Hospital course was otherwise relatively uneventful and he was discharged home on March 6 on aspirin, statin, beta-blocker, and Brilinta therapy. ? ?Since d/c he has done reasonably well.  He has noticed some fatigue but has been taking it easy.  He has not had any chest pain or dyspnea and denies palpitations, PND, orthopnea, dizziness, syncope, edema, or early satiety.  He has been tolerating medications well.  He is interested in cardiac rehabilitation and plans to enroll.  He has follow-up with primary care tomorrow.  We discussed his prior history of sleep apnea and intolerance of CPAP.  I offered to arrange for an at home sleep study however, he prefers to manage this through primary care. ? ?Home Medications  ?  ?Current Outpatient Medications  ?Medication Sig Dispense Refill  ? aspirin EC 81 MG EC tablet Take 1 tablet (81 mg total) by mouth daily. Swallow whole. 30 tablet 11  ? atorvastatin (LIPITOR) 80 MG tablet Take 1 tablet (80 mg total) by mouth daily. 90 tablet 3  ? carvedilol (COREG) 6.25 MG tablet Take 1 tablet (6.25 mg total) by mouth 2 (two) times daily with a meal. 60 tablet 3  ? nitroGLYCERIN (NITROSTAT) 0.4  MG SL tablet Place 1 tablet (0.4 mg total) under the tongue every 5 (five) minutes as needed for chest pain. 30 tablet 12  ? ticagrelor (BRILINTA) 90 MG TABS tablet Take 1 tablet (90 mg total) by mouth 2 (two) times daily. 60 tablet 5  ? ?No current facility-administered medications for this visit.  ?  ? ?Review of Systems  ?  ?Has had some fatigue since discharge.  He denies chest pain, palpitations, dyspnea, pnd, orthopnea, n, v, dizziness,  syncope, edema, weight gain, or early satiety.  All other systems reviewed and are otherwise negative except as noted above. ? ? ?Cardiac Rehabilitation Eligibility Assessment  ?   ? ?Physical Exam  ?  ?VS:  BP 118/60 (BP Location: Left Arm, Patient Position: Sitting, Cuff Size: Large)   Pulse 63   Ht '5\' 7"'$  (1.702 m)   Wt 272 lb (123.4 kg)   BMI 42.60 kg/m?  , BMI Body mass index is 42.6 kg/m?. ?STOP-Bang Score:  8      ?GEN: Obese,, in no acute distress. ?HEENT: normal. ?Neck: Supple, obese, difficult to gauge JVP.  No carotid bruits or masses. ?Cardiac: RRR, no murmurs, rubs, or gallops. No clubbing, cyanosis, edema.  Radials 2+/PT 2+ and equal bilaterally.  Right radial catheterization site without bleeding, bruit, or hematoma. ?Respiratory:  Respirations regular and unlabored, clear to auscultation bilaterally. ?GI: Obese, soft, nontender, nondistended, BS + x 4. ?MS: no deformity or atrophy. ?Skin: warm and dry, no rash. ?Neuro:  Strength and sensation are intact. ?Psych: Normal affect. ? ?Accessory Clinical Findings  ?  ?ECG personally reviewed by me today -regular sinus rhythm, 63, nonspecific ST/T changes- no acute changes. ? ?Lab Results  ?Component Value Date  ? WBC 5.3 10/22/2021  ? HGB 14.7 10/22/2021  ? HCT 44.2 10/22/2021  ? MCV 88.9 10/22/2021  ? PLT 210 10/22/2021  ? ?Lab Results  ?Component Value Date  ? CREATININE 1.09 10/22/2021  ? BUN 17 10/22/2021  ? NA 138 10/22/2021  ? K 4.0 10/22/2021  ? CL 105 10/22/2021  ? CO2 24 10/22/2021  ? ?Lab Results  ?Component Value Date  ? ALT 19 10/20/2021  ? AST 34 10/20/2021  ? ALKPHOS 79 10/20/2021  ? BILITOT 0.7 10/20/2021  ? ?Lab Results  ?Component Value Date  ? CHOL 180 10/20/2021  ? HDL 34 (L) 10/20/2021  ? LDLCALC 112 (H) 10/20/2021  ? TRIG 171 (H) 10/20/2021  ? CHOLHDL 5.3 10/20/2021  ?  ?Lab Results  ?Component Value Date  ? HGBA1C 6.1 (H) 10/20/2021  ? ? ?Assessment & Plan  ?  ?1.  Non-STEMI, subsequent episode of care/coronary artery disease:  Status post recent admission to [facility] with a several day history of intermittent chest discomfort.  He ruled in for non-STEMI with peak troponin of 4253.  Diagnostic catheterization revealed severe mid left circumflex and mid RCA disease.  Both areas were successfully treated with drug-eluting stents.  EF was 50 to 55% with grade 1 diastolic dysfunction on echo.  Since discharge, he has had some fatigue but overall has been feeling well.  He has not had any chest pain or dyspnea and is tolerating medications.  We a long discussion about his hospitalization, catheterization findings, and recommendations for secondary prevention, including cardiac rehabilitation.  Though he initially was not planning on participating cardiac rehab, he has now changed his mind after our discussion.  Heart rate and blood pressure well controlled I will continue current doses of his medications including  beta-blocker, high potency statin, and dual antiplatelet therapy for at least 1 year. ? ?2.  Essential hypertension: Stable on beta-blocker therapy. ? ?3.  Hyperlipidemia: LDL was 112 at the time of his admission in the setting of statin naivety.  We will plan to follow-up lipids and LFTs at his office visit in 1 month.  Goal LDL less than 70. ? ?4.  Obstructive sleep apnea: Patient with prior history of obstructive sleep apnea and he was previously on BiPAP but says it has been many years and he did not really tolerate the mask.  His STOP-BANG is 8.  He will require repeat testing.  I offered to either set him up with a home study through our office today or refer him to pulmonology.  He prefers to have this managed through his primary care provider's office and will discuss it with his PCP tomorrow. ? ?5.  Morbid obesity: Discussed the importance of lifestyle modifications and weight loss as part of secondary prevention.  Activity and dietary counseling provided.  Patient will benefit from cardiac rehabilitation. ? ?6.   Prediabetes: A1c 6.1 earlier this month.  Lifestyle modifications discussed as above. ? ?7.  Disposition: Follow-up in 1 month at which time we will follow-up lipids and LFTs. ? ? ?Murray Hodgkins, NP ?10/31/2021

## 2021-11-01 DIAGNOSIS — E039 Hypothyroidism, unspecified: Secondary | ICD-10-CM | POA: Diagnosis not present

## 2021-11-01 DIAGNOSIS — Z6841 Body Mass Index (BMI) 40.0 and over, adult: Secondary | ICD-10-CM | POA: Diagnosis not present

## 2021-11-01 DIAGNOSIS — R739 Hyperglycemia, unspecified: Secondary | ICD-10-CM | POA: Diagnosis not present

## 2021-11-01 DIAGNOSIS — Z1389 Encounter for screening for other disorder: Secondary | ICD-10-CM | POA: Diagnosis not present

## 2021-11-01 DIAGNOSIS — G4733 Obstructive sleep apnea (adult) (pediatric): Secondary | ICD-10-CM | POA: Diagnosis not present

## 2021-11-01 DIAGNOSIS — M159 Polyosteoarthritis, unspecified: Secondary | ICD-10-CM | POA: Diagnosis not present

## 2021-11-01 DIAGNOSIS — E785 Hyperlipidemia, unspecified: Secondary | ICD-10-CM | POA: Diagnosis not present

## 2021-11-01 DIAGNOSIS — I10 Essential (primary) hypertension: Secondary | ICD-10-CM | POA: Diagnosis not present

## 2021-11-01 DIAGNOSIS — Z0000 Encounter for general adult medical examination without abnormal findings: Secondary | ICD-10-CM | POA: Diagnosis not present

## 2021-11-08 ENCOUNTER — Other Ambulatory Visit: Payer: Self-pay

## 2021-11-08 ENCOUNTER — Encounter: Payer: Medicare HMO | Attending: Cardiovascular Disease

## 2021-11-08 DIAGNOSIS — I252 Old myocardial infarction: Secondary | ICD-10-CM | POA: Insufficient documentation

## 2021-11-08 DIAGNOSIS — I213 ST elevation (STEMI) myocardial infarction of unspecified site: Secondary | ICD-10-CM

## 2021-11-08 DIAGNOSIS — Z955 Presence of coronary angioplasty implant and graft: Secondary | ICD-10-CM | POA: Insufficient documentation

## 2021-11-08 NOTE — Progress Notes (Signed)
Virtual Visit completed. Patient informed on EP and RD appointment and 6 Minute walk test. Patient also informed of patient health questionnaires on My Chart. Patient Verbalizes understanding. Visit diagnosis can be found in [facility] 10/20/2021. ?

## 2021-11-15 ENCOUNTER — Encounter: Payer: Medicare HMO | Admitting: *Deleted

## 2021-11-15 VITALS — Ht 67.6 in | Wt 271.2 lb

## 2021-11-15 DIAGNOSIS — I252 Old myocardial infarction: Secondary | ICD-10-CM | POA: Diagnosis not present

## 2021-11-15 DIAGNOSIS — Z955 Presence of coronary angioplasty implant and graft: Secondary | ICD-10-CM | POA: Diagnosis not present

## 2021-11-15 DIAGNOSIS — I213 ST elevation (STEMI) myocardial infarction of unspecified site: Secondary | ICD-10-CM

## 2021-11-15 NOTE — Patient Instructions (Signed)
Patient Instructions ? ?Patient Details  ?Name: Alan Stewart ?MRN: [id number] ?Date of Birth: 13-Jul-1946 ?Referring Provider:  Wellington Hampshire, MD ? ?Below are your personal goals for exercise, nutrition, and risk factors. Our goal is to help you stay on track towards obtaining and maintaining these goals. We will be discussing your progress on these goals with you throughout the program. ? ?Initial Exercise Prescription: ? Initial Exercise Prescription - 11/15/21 1000   ? ?  ? Date of Initial Exercise RX and Referring Provider  ? Date 11/15/21   ? Referring Provider Kathlyn Sacramento MD   ?  ? Oxygen  ? Maintain Oxygen Saturation 88% or higher   ?  ? Treadmill  ? MPH 1.8   ? Grade 0.5   ? Minutes 15   ? METs 2.5   ?  ? Arm Ergometer  ? Level 1   ? Watts 25   ? RPM 25   ? Minutes 15   ? METs 1.5   ?  ? REL-XR  ? Level 1   ? Speed 50   ? Minutes 15   ? METs 2   ?  ? Track  ? Laps 28   ? Minutes 15   ? METs 2.52   ?  ? Prescription Details  ? Frequency (times per week) 3   ? Duration Progress to 30 minutes of continuous aerobic without signs/symptoms of physical distress   ?  ? Intensity  ? THRR 40-80% of Max Heartrate 89-128   ? Ratings of Perceived Exertion 11-13   ? Perceived Dyspnea 0-4   ?  ? Progression  ? Progression Continue to progress workloads to maintain intensity without signs/symptoms of physical distress.   ?  ? Resistance Training  ? Training Prescription Yes   ? Weight 4 lb   ? Reps 10-15   ? ?  ?  ? ?  ? ? ?Exercise Goals: ?Frequency: Be able to perform aerobic exercise two to three times per week in program working toward 2-5 days per week of home exercise. ? ?Intensity: Work with a perceived exertion of 11 (fairly light) - 15 (hard) while following your exercise prescription.  We will make changes to your prescription with you as you progress through the program. ?  ?Duration: Be able to do 30 to 45 minutes of continuous aerobic exercise in addition to a 5 minute warm-up and a 5 minute cool-down  routine. ?  ?Nutrition Goals: ?Your personal nutrition goals will be established when you do your nutrition analysis with the dietician. ? ?The following are general nutrition guidelines to follow: ?Cholesterol < '200mg'$ /day ?Sodium < '1500mg'$ /day ?Fiber: Men over 50 yrs - 30 grams per day ? ?Personal Goals: ? Personal Goals and Risk Factors at Admission - 11/15/21 1050   ? ?  ? Core Components/Risk Factors/Patient Goals on Admission  ?  Weight Management Yes;Weight Loss;Obesity   ? Intervention Weight Management: Develop a combined nutrition and exercise program designed to reach desired caloric intake, while maintaining appropriate intake of nutrient and fiber, sodium and fats, and appropriate energy expenditure required for the weight goal.;Weight Management: Provide education and appropriate resources to help participant work on and attain dietary goals.;Weight Management/Obesity: Establish reasonable short term and long term weight goals.;Obesity: Provide education and appropriate resources to help participant work on and attain dietary goals.   ? Admit Weight 271 lb 3.2 oz (123 kg)   ? Goal Weight: Short Term 266 lb (120.7 kg)   ?  Goal Weight: Long Term 260 lb (117.9 kg)   ? Expected Outcomes Long Term: Adherence to nutrition and physical activity/exercise program aimed toward attainment of established weight goal;Short Term: Continue to assess and modify interventions until short term weight is achieved;Understanding recommendations for meals to include 15-35% energy as protein, 25-35% energy from fat, 35-60% energy from carbohydrates, less than '200mg'$  of dietary cholesterol, 20-35 gm of total fiber daily;Weight Loss: Understanding of general recommendations for a balanced deficit meal plan, which promotes 1-2 lb weight loss per week and includes a negative energy balance of [phone] kcal/d;Understanding of distribution of calorie intake throughout the day with the consumption of 4-5 meals/snacks   ? Heart Failure  Yes   ? Intervention Provide a combined exercise and nutrition program that is supplemented with education, support and counseling about heart failure. Directed toward relieving symptoms such as shortness of breath, decreased exercise tolerance, and extremity edema.   ? Expected Outcomes Improve functional capacity of life;Short term: Attendance in program 2-3 days a week with increased exercise capacity. Reported lower sodium intake. Reported increased fruit and vegetable intake. Reports medication compliance.;Short term: Daily weights obtained and reported for increase. Utilizing diuretic protocols set by physician.;Long term: Adoption of self-care skills and reduction of barriers for early signs and symptoms recognition and intervention leading to self-care maintenance.   ? Hypertension Yes   ? Intervention Provide education on lifestyle modifcations including regular physical activity/exercise, weight management, moderate sodium restriction and increased consumption of fresh fruit, vegetables, and low fat dairy, alcohol moderation, and smoking cessation.;Monitor prescription use compliance.   ? Expected Outcomes Short Term: Continued assessment and intervention until BP is < 140/27m HG in hypertensive participants. < 130/812mHG in hypertensive participants with diabetes, heart failure or chronic kidney disease.;Long Term: Maintenance of blood pressure at goal levels.   ? Lipids Yes   ? Intervention Provide education and support for participant on nutrition & aerobic/resistive exercise along with prescribed medications to achieve LDL '70mg'$ , HDL >'40mg'$ .   ? Expected Outcomes Short Term: Participant states understanding of desired cholesterol values and is compliant with medications prescribed. Participant is following exercise prescription and nutrition guidelines.;Long Term: Cholesterol controlled with medications as prescribed, with individualized exercise RX and with personalized nutrition plan. Value goals:  LDL < '70mg'$ , HDL > 40 mg.   ? ?  ?  ? ?  ? ? ?Tobacco Use Initial Evaluation: ?Social History  ? ?Tobacco Use  ?Smoking Status Former  ? Packs/day: 1.50  ? Years: 10.00  ? Pack years: 15.00  ? Types: Cigarettes  ? Quit date: 07/19/1990  ? Years since quitting: 31.3  ?Smokeless Tobacco Former  ? ? ?Exercise Goals and Review: ? Exercise Goals   ? ? [place] 11[place]15[place]21 1048  ?  ?  ?  ?  ?  ? Exercise Goals  ? Increase Physical Activity Yes      ? Intervention Provide advice, education, support and counseling about physical activity/exercise needs.;Develop an individualized exercise prescription for aerobic and resistive training based on initial evaluation findings, risk stratification, comorbidities and participant's personal goals.      ? Expected Outcomes Short Term: Attend rehab on a regular basis to increase amount of physical activity.;Long Term: Add in home exercise to make exercise part of routine and to increase amount of physical activity.;Long Term: Exercising regularly at least 3-5 days a week.      ? Increase Strength and Stamina Yes      ? Intervention Provide advice, education, support  and counseling about physical activity/exercise needs.;Develop an individualized exercise prescription for aerobic and resistive training based on initial evaluation findings, risk stratification, comorbidities and participant's personal goals.      ? Expected Outcomes Short Term: Increase workloads from initial exercise prescription for resistance, speed, and METs.;Short Term: Perform resistance training exercises routinely during rehab and add in resistance training at home;Long Term: Improve cardiorespiratory fitness, muscular endurance and strength as measured by increased METs and functional capacity (6MWT)      ? Able to understand and use rate of perceived exertion (RPE) scale Yes      ? Intervention Provide education and explanation on how to use RPE scale      ? Expected Outcomes Short Term: Able to use RPE daily in  rehab to express subjective intensity level;Long Term:  Able to use RPE to guide intensity level when exercising independently      ? Able to understand and use Dyspnea scale Yes      ? Intervention Provi

## 2021-11-15 NOTE — Progress Notes (Signed)
Cardiac Individual Treatment Plan ? ?Patient Details  ?Name: Alan Stewart ?MRN: [id number] ?Date of Birth: May 01, 1946 ?Referring Provider:   ?Flowsheet Row Cardiac Rehab from 11/15/2021 in [facility] Cardiac and Pulmonary Rehab  ?Referring Provider Kathlyn Sacramento MD  ? ?  ? ? ?Initial Encounter Date:  ?Flowsheet Row Cardiac Rehab from 11/15/2021 in [facility] Cardiac and Pulmonary Rehab  ?Date 11/15/21  ? ?  ? ? ?Visit Diagnosis: ST elevation myocardial infarction (STEMI), unspecified artery ([place]) ? ?Status post coronary artery stent placement ? ?Patient's Home Medications on Admission: ? ?Current Outpatient Medications:  ?  aspirin EC 81 MG EC tablet, Take 1 tablet (81 mg total) by mouth daily. Swallow whole., Disp: 30 tablet, Rfl: 11 ?  atorvastatin (LIPITOR) 80 MG tablet, Take 1 tablet (80 mg total) by mouth daily., Disp: 90 tablet, Rfl: 3 ?  carvedilol (COREG) 6.25 MG tablet, Take 1 tablet (6.25 mg total) by mouth 2 (two) times daily with a meal., Disp: 60 tablet, Rfl: 3 ?  levothyroxine (SYNTHROID) 75 MCG tablet, Take by mouth., Disp: , Rfl:  ?  nitroGLYCERIN (NITROSTAT) 0.4 MG SL tablet, Place 1 tablet (0.4 mg total) under the tongue every 5 (five) minutes as needed for chest pain., Disp: 30 tablet, Rfl: 12 ?  ticagrelor (BRILINTA) 90 MG TABS tablet, Take 1 tablet (90 mg total) by mouth 2 (two) times daily., Disp: 60 tablet, Rfl: 5 ? ?Past Medical History: ?Past Medical History:  ?Diagnosis Date  ? CAD (coronary artery disease)   ? a. 10/2021 NSTEMI/PCI: LM nl, LAD min irregs, LCX 29m(2.5x18 Onyx Frontier DES), OM1 nl, RCA 968m3.5x26 Onyx Frontier DES), 3074Q ? Diastolic dysfunction   ? a. 10/2021 Echo: EF 50-55%, no rwma, Gr1 DD, nl RV fxn. No significant valvular dzs.  ? Hyperglycemia   ? Hyperlipidemia   ? Hypertension   ? Morbid obesity ([place]  ? Osteoarthritis   ? Sleep apnea   ? ? ?Tobacco Use: ?Social History  ? ?Tobacco Use  ?Smoking Status Former  ? Packs/day: 1.50  ? Years: 10.00  ? Pack years: 15.00  ? Types:  Cigarettes  ? Quit date: 07/19/1990  ? Years since quitting: 31.3  ?Smokeless Tobacco Former  ? ? ?Labs: ?Review Flowsheet   ? ?  ?  Latest Ref Rng & Units 10/20/2021  ?Labs for [facility] Cardiac and Pulmonary Rehab  ?Cholestrol 0 - 200 mg/dL 180    ?LDL (calc) 0 - 99 mg/dL 112    ?HDL-C >40 mg/dL 34    ?Trlycerides <150 mg/dL 171    ?Hemoglobin A1c 4.8 - 5.6 % 6.1    ?  ? ? Multiple values from one day are sorted in reverse-chronological order  ?  ?  ? ? ? ?Exercise Target Goals: ?Exercise Program Goal: ?Individual exercise prescription set using results from initial 6 min walk test and THRR while considering  patient?s activity barriers and safety.  ? ?Exercise Prescription Goal: ?Initial exercise prescription builds to 30-45 minutes a day of aerobic activity, 2-3 days per week.  Home exercise guidelines will be given to patient during program as part of exercise prescription that the participant will acknowledge. ? ? ?Education: Aerobic Exercise: ?- Group verbal and visual presentation on the components of exercise prescription. Introduces F.I.T.T principle from ACSM for exercise prescriptions.  Reviews F.I.T.T. principles of aerobic exercise including progression. Written material given at graduation. ?[facility]  ?Education need identified 11/15/21  ? ?  ? ? ?  Education: Resistance Exercise: ?- Group verbal and visual presentation on the components of exercise prescription. Introduces F.I.T.T principle from ACSM for exercise prescriptions  Reviews F.I.T.T. principles of resistance exercise including progression. Written material given at graduation. ? ?  ?Education: Exercise & Equipment Safety: ?- Individual verbal instruction and demonstration of equipment use and safety with use of the equipment. ?Flowsheet Row Cardiac Rehab from 11/15/2021 in [facility] Cardiac and Pulmonary Rehab  ?Date 11/08/21  ?Educator [facility]  ?Instruction Review Code 1- Verbalizes  Understanding  ? ?  ? ? ?Education: Exercise Physiology & General Exercise Guidelines: ?- Group verbal and written instruction with models to review the exercise physiology of the cardiovascular system and associated critical values. Provides general exercise guidelines with specific guidelines to those with heart or lung disease.  ? ? ?Education: Flexibility, Balance, Mind/Body Relaxation: ?- Group verbal and visual presentation with interactive activity on the components of exercise prescription. Introduces F.I.T.T principle from ACSM for exercise prescriptions. Reviews F.I.T.T. principles of flexibility and balance exercise training including progression. Also discusses the mind body connection.  Reviews various relaxation techniques to help reduce and manage stress (i.e. Deep breathing, progressive muscle relaxation, and visualization). Balance handout provided to take home. Written material given at graduation. ? ? ?Activity Barriers & Risk Stratification: ? Activity Barriers & Cardiac Risk Stratification - 11/15/21 1045   ? ?  ? Activity Barriers & Cardiac Risk Stratification  ? Activity Barriers Deconditioning;Muscular Weakness;Shortness of Breath;Balance Concerns;Arthritis   ? Cardiac Risk Stratification Moderate   ? ?  ?  ? ?  ? ? ?6 Minute Walk: ? 6 Minute Walk   ? ? [place] Name 11/15/21 1044  ?  ?  ?  ? 6 Minute Walk  ? Phase Initial    ? Distance 1078 feet    ? Walk Time 6 minutes    ? # of Rest Breaks 0    ? MPH 2.04    ? METS 1.39    ? RPE 13    ? Perceived Dyspnea  1    ? VO2 Peak 4.88    ? Symptoms Yes (comment)    ? Comments slightly SOB    ? Resting HR 50 bpm    ? Resting BP 132/72    ? Resting Oxygen Saturation  97 %    ? Exercise Oxygen Saturation  during 6 min walk 97 %    ? Max Ex. HR 79 bpm    ? Max Ex. BP 144/76    ? 2 Minute Post BP 124/60    ? ?  ?  ? ?  ? ? ?Oxygen Initial Assessment: ? ? ?Oxygen Re-Evaluation: ? ? ?Oxygen Discharge (Final Oxygen Re-Evaluation): ? ? ?Initial Exercise  Prescription: ? Initial Exercise Prescription - 11/15/21 1000   ? ?  ? Date of Initial Exercise RX and Referring Provider  ? Date 11/15/21   ? Referring Provider Kathlyn Sacramento MD   ?  ? Oxygen  ? Maintain Oxygen Saturation 88% or higher   ?  ? Treadmill  ? MPH 1.8   ? Grade 0.5   ? Minutes 15   ? METs 2.5   ?  ? Arm Ergometer  ? Level 1   ? Watts 25   ? RPM 25   ? Minutes 15   ? METs 1.5   ?  ? REL-XR  ? Level 1   ? Speed 50   ? Minutes 15   ? METs 2   ?  ?  Track  ? Laps 28   ? Minutes 15   ? METs 2.52   ?  ? Prescription Details  ? Frequency (times per week) 3   ? Duration Progress to 30 minutes of continuous aerobic without signs/symptoms of physical distress   ?  ? Intensity  ? THRR 40-80% of Max Heartrate 89-128   ? Ratings of Perceived Exertion 11-13   ? Perceived Dyspnea 0-4   ?  ? Progression  ? Progression Continue to progress workloads to maintain intensity without signs/symptoms of physical distress.   ?  ? Resistance Training  ? Training Prescription Yes   ? Weight 4 lb   ? Reps 10-15   ? ?  ?  ? ?  ? ? ?Perform Capillary Blood Glucose checks as needed. ? ?Exercise Prescription Changes: ? ? Exercise Prescription Changes   ? ? [place] Name 11/15/21 1000  ?  ?  ?  ?  ?  ? Response to Exercise  ? Blood Pressure (Admit) 132/72      ? Blood Pressure (Exercise) 144/76      ? Blood Pressure (Exit) 124/60      ? Heart Rate (Admit) 50 bpm      ? Heart Rate (Exercise) 79 bpm      ? Heart Rate (Exit) 56 bpm      ? Oxygen Saturation (Admit) 97 %      ? Oxygen Saturation (Exercise) 97 %      ? Rating of Perceived Exertion (Exercise) 13      ? Perceived Dyspnea (Exercise) 1      ? Symptoms SOB      ? Comments walk test results      ? ?  ?  ? ?  ? ? ?Exercise Comments: ? ? ?Exercise Goals and Review: ? ? Exercise Goals   ? ? [place] Name 11/15/21 1048  ?  ?  ?  ?  ?  ? Exercise Goals  ? Increase Physical Activity Yes      ? Intervention Provide advice, education, support and counseling about physical activity/exercise  needs.;Develop an individualized exercise prescription for aerobic and resistive training based on initial evaluation findings, risk stratification, comorbidities and participant's personal goals.      ? Expected Outcomes [place]

## 2021-11-16 ENCOUNTER — Encounter: Payer: Medicare HMO | Admitting: *Deleted

## 2021-11-16 DIAGNOSIS — Z955 Presence of coronary angioplasty implant and graft: Secondary | ICD-10-CM | POA: Diagnosis not present

## 2021-11-16 DIAGNOSIS — I214 Non-ST elevation (NSTEMI) myocardial infarction: Secondary | ICD-10-CM

## 2021-11-16 DIAGNOSIS — I252 Old myocardial infarction: Secondary | ICD-10-CM | POA: Diagnosis not present

## 2021-11-16 NOTE — Progress Notes (Signed)
Daily Session Note ? ?Patient Details  ?Name: Alan Stewart ?MRN: [id number] ?Date of Birth: 02-23-1946 ?Referring Provider:   ?Flowsheet Row Cardiac Rehab from 11/15/2021 in [facility] Cardiac and Pulmonary Rehab  ?Referring Provider Kathlyn Sacramento MD  ? ?  ? ? ?Encounter Date: 11/16/2021 ? ?Check In: ? Session Check In - 11/16/21 0803   ? ?  ? Check-In  ? Supervising physician immediately available to respond to emergencies See telemetry face sheet for immediately available ER MD   ? Location [facility]-Cardiac & Pulmonary Rehab   ? Staff Present Heath Lark, RN, BSN, CCRP;Joseph [place], RCP,RRT,BSRT;Melissa [place], [place], LDN   ? Virtual Visit No   ? Medication changes reported     No   ? Fall or balance concerns reported    No   ? Warm-up and Cool-down Performed on first and last piece of equipment   ? Resistance Training Performed Yes   ? VAD Patient? No   ? PAD/SET Patient? No   ?  ? Pain Assessment  ? Currently in Pain? No/denies   ? ?  ?  ? ?  ? ? ? ? ? ?Social History  ? ?Tobacco Use  ?Smoking Status Former  ? Packs/day: 1.50  ? Years: 10.00  ? Pack years: 15.00  ? Types: Cigarettes  ? Quit date: 07/19/1990  ? Years since quitting: 31.3  ?Smokeless Tobacco Former  ? ? ?Goals Met:  ?Exercise tolerated well ?Personal goals reviewed ?No report of concerns or symptoms today ? ?Goals Unmet:  ?Not Applicable ? ?Comments: First full day of exercise!  Patient was oriented to gym and equipment including functions, settings, policies, and procedures.  Patient's individual exercise prescription and treatment plan were reviewed.  All starting workloads were established based on the results of the 6 minute walk test done at initial orientation visit.  The plan for exercise progression was also introduced and progression will be customized based on patient's performance and goals. ? ? ? ?Dr. Emily Filbert is Medical Director for [place].  ?Dr. Ottie Glazier is Medical Director for [facility] Pulmonary  Rehabilitation. ?

## 2021-11-19 ENCOUNTER — Encounter: Payer: Medicare HMO | Attending: Cardiovascular Disease | Admitting: *Deleted

## 2021-11-19 DIAGNOSIS — Z955 Presence of coronary angioplasty implant and graft: Secondary | ICD-10-CM | POA: Diagnosis not present

## 2021-11-19 DIAGNOSIS — I214 Non-ST elevation (NSTEMI) myocardial infarction: Secondary | ICD-10-CM

## 2021-11-19 DIAGNOSIS — I252 Old myocardial infarction: Secondary | ICD-10-CM | POA: Insufficient documentation

## 2021-11-19 DIAGNOSIS — Z48812 Encounter for surgical aftercare following surgery on the circulatory system: Secondary | ICD-10-CM | POA: Diagnosis not present

## 2021-11-19 NOTE — Progress Notes (Signed)
Daily Session Note ? ?Patient Details  ?Name: Alan Stewart ?MRN: [id number] ?Date of Birth: 1945/12/11 ?Referring Provider:   ?Flowsheet Row Cardiac Rehab from 11/15/2021 in [facility] Cardiac and Pulmonary Rehab  ?Referring Provider Kathlyn Sacramento MD  ? ?  ? ? ?Encounter Date: 11/19/2021 ? ?Check In: ? Session Check In - 11/19/21 0810   ? ?  ? Check-In  ? Supervising physician immediately available to respond to emergencies See telemetry face sheet for immediately available ER MD   ? Location [facility]-Cardiac & Pulmonary Rehab   ? Staff Present Heath Lark, RN, BSN, CCRP;Joseph [place], RCP,RRT,BSRT;Kelly [place], [place], ACSM CEP, Exercise Physiologist   ? Virtual Visit No   ? Medication changes reported     No   ? Fall or balance concerns reported    No   ? Warm-up and Cool-down Performed on first and last piece of equipment   ? Resistance Training Performed Yes   ? VAD Patient? No   ? PAD/SET Patient? No   ?  ? Pain Assessment  ? Currently in Pain? No/denies   ? ?  ?  ? ?  ? ? ? ? ? ?Social History  ? ?Tobacco Use  ?Smoking Status Former  ? Packs/day: 1.50  ? Years: 10.00  ? Pack years: 15.00  ? Types: Cigarettes  ? Quit date: 07/19/1990  ? Years since quitting: 31.3  ?Smokeless Tobacco Former  ? ? ?Goals Met:  ?Independence with exercise equipment ?Exercise tolerated well ?No report of concerns or symptoms today ? ?Goals Unmet:  ?Not Applicable ? ?Comments: Pt able to follow exercise prescription today without complaint.  Will continue to monitor for progression. ? ? ? ?Dr. Emily Filbert is Medical Director for [place].  ?Dr. Ottie Glazier is Medical Director for [facility] Pulmonary Rehabilitation. ?

## 2021-11-21 DIAGNOSIS — I214 Non-ST elevation (NSTEMI) myocardial infarction: Secondary | ICD-10-CM

## 2021-11-21 DIAGNOSIS — Z48812 Encounter for surgical aftercare following surgery on the circulatory system: Secondary | ICD-10-CM | POA: Diagnosis not present

## 2021-11-21 DIAGNOSIS — I252 Old myocardial infarction: Secondary | ICD-10-CM | POA: Diagnosis not present

## 2021-11-21 DIAGNOSIS — Z955 Presence of coronary angioplasty implant and graft: Secondary | ICD-10-CM | POA: Diagnosis not present

## 2021-11-21 NOTE — Progress Notes (Signed)
Daily Session Note ? ?Patient Details  ?Name: Alan Stewart ?MRN: [id number] ?Date of Birth: 1946/02/13 ?Referring Provider:   ?Flowsheet Row Cardiac Rehab from 11/15/2021 in [facility] Cardiac and Pulmonary Rehab  ?Referring Provider Kathlyn Sacramento MD  ? ?  ? ? ?Encounter Date: 11/21/2021 ? ?Check In: ? Session Check In - 11/21/21 0726   ? ?  ? Check-In  ? Supervising physician immediately available to respond to emergencies See telemetry face sheet for immediately available ER MD   ? Location [facility]-Cardiac & Pulmonary Rehab   ? Staff Present Birdie Sons, MPA, RN;Joseph [place], RCP,RRT,BSRT;Jessica Grimes, MA, RCEP, CCRP, CCET   ? Virtual Visit No   ? Medication changes reported     No   ? Fall or balance concerns reported    No   ? Warm-up and Cool-down Performed on first and last piece of equipment   ? Resistance Training Performed Yes   ? VAD Patient? No   ? PAD/SET Patient? No   ?  ? Pain Assessment  ? Currently in Pain? No/denies   ? ?  ?  ? ?  ? ? ? ? ? ?Social History  ? ?Tobacco Use  ?Smoking Status Former  ? Packs/day: 1.50  ? Years: 10.00  ? Pack years: 15.00  ? Types: Cigarettes  ? Quit date: 07/19/1990  ? Years since quitting: 31.3  ?Smokeless Tobacco Former  ? ? ?Goals Met:  ?Independence with exercise equipment ?Exercise tolerated well ?No report of concerns or symptoms today ?Strength training completed today ? ?Goals Unmet:  ?Not Applicable ? ?Comments: Pt able to follow exercise prescription today without complaint.  Will continue to monitor for progression. ? ? ? ?Dr. Emily Filbert is Medical Director for [place].  ?Dr. Ottie Glazier is Medical Director for [facility] Pulmonary Rehabilitation. ?

## 2021-11-23 ENCOUNTER — Encounter: Payer: Medicare HMO | Admitting: *Deleted

## 2021-11-23 DIAGNOSIS — Z48812 Encounter for surgical aftercare following surgery on the circulatory system: Secondary | ICD-10-CM | POA: Diagnosis not present

## 2021-11-23 DIAGNOSIS — I214 Non-ST elevation (NSTEMI) myocardial infarction: Secondary | ICD-10-CM

## 2021-11-23 DIAGNOSIS — Z955 Presence of coronary angioplasty implant and graft: Secondary | ICD-10-CM | POA: Diagnosis not present

## 2021-11-23 DIAGNOSIS — I252 Old myocardial infarction: Secondary | ICD-10-CM | POA: Diagnosis not present

## 2021-11-23 NOTE — Progress Notes (Signed)
Daily Session Note ? ?Patient Details  ?Name: Alan Stewart ?MRN: [id number] ?Date of Birth: 1946/04/21 ?Referring Provider:   ?Flowsheet Row Cardiac Rehab from 11/15/2021 in [facility] Cardiac and Pulmonary Rehab  ?Referring Provider Kathlyn Sacramento MD  ? ?  ? ? ?Encounter Date: 11/23/2021 ? ?Check In: ? Session Check In - 11/23/21 0808   ? ?  ? Check-In  ? Supervising physician immediately available to respond to emergencies See telemetry face sheet for immediately available ER MD   ? Location [facility]-Cardiac & Pulmonary Rehab   ? Staff Present Carson Myrtle, BS, RRT, CPFT;Joseph [place], [place];Heath Lark, RN, BSN, CCRP   ? Virtual Visit No   ? Medication changes reported     No   ? Fall or balance concerns reported    No   ? Warm-up and Cool-down Performed on first and last piece of equipment   ? Resistance Training Performed Yes   ? VAD Patient? No   ? PAD/SET Patient? No   ?  ? Pain Assessment  ? Currently in Pain? No/denies   ? ?  ?  ? ?  ? ? ? ? ? ?Social History  ? ?Tobacco Use  ?Smoking Status Former  ? Packs/day: 1.50  ? Years: 10.00  ? Pack years: 15.00  ? Types: Cigarettes  ? Quit date: 07/19/1990  ? Years since quitting: 31.3  ?Smokeless Tobacco Former  ? ? ?Goals Met:  ?Independence with exercise equipment ?Exercise tolerated well ?No report of concerns or symptoms today ? ?Goals Unmet:  ?Not Applicable ? ?Comments: Pt able to follow exercise prescription today without complaint.  Will continue to monitor for progression. ? ? ? ?Dr. Emily Filbert is Medical Director for [place].  ?Dr. Ottie Glazier is Medical Director for [facility] Pulmonary Rehabilitation. ?

## 2021-11-26 ENCOUNTER — Encounter: Payer: Medicare HMO | Admitting: *Deleted

## 2021-11-26 DIAGNOSIS — Z955 Presence of coronary angioplasty implant and graft: Secondary | ICD-10-CM | POA: Diagnosis not present

## 2021-11-26 DIAGNOSIS — I214 Non-ST elevation (NSTEMI) myocardial infarction: Secondary | ICD-10-CM

## 2021-11-26 DIAGNOSIS — Z48812 Encounter for surgical aftercare following surgery on the circulatory system: Secondary | ICD-10-CM | POA: Diagnosis not present

## 2021-11-26 DIAGNOSIS — I252 Old myocardial infarction: Secondary | ICD-10-CM | POA: Diagnosis not present

## 2021-11-26 NOTE — Progress Notes (Signed)
Completed initial RD consultation ?

## 2021-11-26 NOTE — Progress Notes (Signed)
Daily Session Note ? ?Patient Details  ?Name: Alan Stewart ?MRN: [id number] ?Date of Birth: November 06, 1945 ?Referring Provider:   ?Flowsheet Row Cardiac Rehab from 11/15/2021 in [facility] Cardiac and Pulmonary Rehab  ?Referring Provider Kathlyn Sacramento MD  ? ?  ? ? ?Encounter Date: 11/26/2021 ? ?Check In: ? Session Check In - 11/26/21 0816   ? ?  ? Check-In  ? Supervising physician immediately available to respond to emergencies See telemetry face sheet for immediately available ER MD   ? Location [facility]-Cardiac & Pulmonary Rehab   ? Staff Present Heath Lark, RN, BSN, CCRP;Laureen Owens Shark, BS, RRT, CPFT;Kelly Amedeo Plenty, BS, ACSM CEP, Exercise Physiologist;Joseph [place], [place]   ? Virtual Visit No   ? Medication changes reported     No   ? Fall or balance concerns reported    No   ? Warm-up and Cool-down Performed on first and last piece of equipment   ? Resistance Training Performed Yes   ? VAD Patient? No   ? PAD/SET Patient? No   ?  ? Pain Assessment  ? Currently in Pain? No/denies   ? ?  ?  ? ?  ? ? ? ? ? ?Social History  ? ?Tobacco Use  ?Smoking Status Former  ? Packs/day: 1.50  ? Years: 10.00  ? Pack years: 15.00  ? Types: Cigarettes  ? Quit date: 07/19/1990  ? Years since quitting: 31.3  ?Smokeless Tobacco Former  ? ? ?Goals Met:  ?Independence with exercise equipment ?Exercise tolerated well ?No report of concerns or symptoms today ? ?Goals Unmet:  ?Not Applicable ? ?Comments: Pt able to follow exercise prescription today without complaint.  Will continue to monitor for progression. ? ? ? ?Dr. Emily Filbert is Medical Director for [place].  ?Dr. Ottie Glazier is Medical Director for [facility] Pulmonary Rehabilitation. ?

## 2021-11-28 ENCOUNTER — Encounter: Payer: Medicare HMO | Admitting: *Deleted

## 2021-11-28 DIAGNOSIS — I214 Non-ST elevation (NSTEMI) myocardial infarction: Secondary | ICD-10-CM

## 2021-11-28 DIAGNOSIS — I252 Old myocardial infarction: Secondary | ICD-10-CM | POA: Diagnosis not present

## 2021-11-28 DIAGNOSIS — Z48812 Encounter for surgical aftercare following surgery on the circulatory system: Secondary | ICD-10-CM | POA: Diagnosis not present

## 2021-11-28 DIAGNOSIS — Z955 Presence of coronary angioplasty implant and graft: Secondary | ICD-10-CM | POA: Diagnosis not present

## 2021-11-28 NOTE — Progress Notes (Signed)
Daily Session Note ? ?Patient Details  ?Name: Alan Stewart ?MRN: [id number] ?Date of Birth: 1945-10-25 ?Referring Provider:   ?Flowsheet Row Cardiac Rehab from 11/15/2021 in [facility] Cardiac and Pulmonary Rehab  ?Referring Provider Kathlyn Sacramento MD  ? ?  ? ? ?Encounter Date: 11/28/2021 ? ?Check In: ? Session Check In - 11/28/21 0840   ? ?  ? Check-In  ? Supervising physician immediately available to respond to emergencies See telemetry face sheet for immediately available ER MD   ? Location [facility]-Cardiac & Pulmonary Rehab   ? Staff Present Heath Lark, RN, BSN, CCRP;Joseph [place], RCP,RRT,BSRT;Amanda [place], [place], ACSM CEP, Exercise Physiologist;Melissa [place], RDN, LDN   ? Virtual Visit No   ? Medication changes reported     No   ? Fall or balance concerns reported    No   ? Warm-up and Cool-down Performed on first and last piece of equipment   ? Resistance Training Performed Yes   ? VAD Patient? No   ? PAD/SET Patient? No   ?  ? Pain Assessment  ? Currently in Pain? No/denies   ? ?  ?  ? ?  ? ? ? ? ? ?Social History  ? ?Tobacco Use  ?Smoking Status Former  ? Packs/day: 1.50  ? Years: 10.00  ? Pack years: 15.00  ? Types: Cigarettes  ? Quit date: 07/19/1990  ? Years since quitting: 31.3  ?Smokeless Tobacco Former  ? ? ?Goals Met:  ?Independence with exercise equipment ?Exercise tolerated well ?No report of concerns or symptoms today ? ?Goals Unmet:  ?Not Applicable ? ?Comments: Pt able to follow exercise prescription today without complaint.  Will continue to monitor for progression. ? ? ? ?Dr. Emily Filbert is Medical Director for [place].  ?Dr. Ottie Glazier is Medical Director for [facility] Pulmonary Rehabilitation. ?

## 2021-11-30 ENCOUNTER — Encounter: Payer: Medicare HMO | Admitting: *Deleted

## 2021-11-30 DIAGNOSIS — Z955 Presence of coronary angioplasty implant and graft: Secondary | ICD-10-CM | POA: Diagnosis not present

## 2021-11-30 DIAGNOSIS — I214 Non-ST elevation (NSTEMI) myocardial infarction: Secondary | ICD-10-CM

## 2021-11-30 DIAGNOSIS — Z48812 Encounter for surgical aftercare following surgery on the circulatory system: Secondary | ICD-10-CM | POA: Diagnosis not present

## 2021-11-30 DIAGNOSIS — I252 Old myocardial infarction: Secondary | ICD-10-CM | POA: Diagnosis not present

## 2021-11-30 NOTE — Progress Notes (Signed)
Daily Session Note ? ?Patient Details  ?Name: Abass Misener Blasdell ?MRN: [id number] ?Date of Birth: Dec 25, 1945 ?Referring Provider:   ?Flowsheet Row Cardiac Rehab from 11/15/2021 in [facility] Cardiac and Pulmonary Rehab  ?Referring Provider Kathlyn Sacramento MD  ? ?  ? ? ?Encounter Date: 11/30/2021 ? ?Check In: ? Session Check In - 11/30/21 0819   ? ?  ? Check-In  ? Supervising physician immediately available to respond to emergencies See telemetry face sheet for immediately available ER MD   ? Location [facility]-Cardiac & Pulmonary Rehab   ? Staff Present Heath Lark, RN, BSN, CCRP;Joseph [place], RCP,RRT,BSRT;Melissa [place], [place], LDN   ? Virtual Visit No   ? Medication changes reported     No   ? Fall or balance concerns reported    No   ? Warm-up and Cool-down Performed on first and last piece of equipment   ? Resistance Training Performed Yes   ? VAD Patient? No   ? PAD/SET Patient? No   ?  ? Pain Assessment  ? Currently in Pain? No/denies   ? ?  ?  ? ?  ? ? ? ? ? ?Social History  ? ?Tobacco Use  ?Smoking Status Former  ? Packs/day: 1.50  ? Years: 10.00  ? Pack years: 15.00  ? Types: Cigarettes  ? Quit date: 07/19/1990  ? Years since quitting: 31.3  ?Smokeless Tobacco Former  ? ? ?Goals Met:  ?Independence with exercise equipment ?Exercise tolerated well ?No report of concerns or symptoms today ? ?Goals Unmet:  ?Not Applicable ? ?Comments: Pt able to follow exercise prescription today without complaint.  Will continue to monitor for progression. ? ? ? ?Dr. Emily Filbert is Medical Director for [place].  ?Dr. Ottie Glazier is Medical Director for [facility] Pulmonary Rehabilitation. ?

## 2021-12-03 ENCOUNTER — Encounter: Payer: Medicare HMO | Admitting: *Deleted

## 2021-12-03 DIAGNOSIS — I214 Non-ST elevation (NSTEMI) myocardial infarction: Secondary | ICD-10-CM

## 2021-12-03 DIAGNOSIS — I252 Old myocardial infarction: Secondary | ICD-10-CM | POA: Diagnosis not present

## 2021-12-03 DIAGNOSIS — E039 Hypothyroidism, unspecified: Secondary | ICD-10-CM | POA: Diagnosis not present

## 2021-12-03 DIAGNOSIS — Z955 Presence of coronary angioplasty implant and graft: Secondary | ICD-10-CM | POA: Diagnosis not present

## 2021-12-03 DIAGNOSIS — I10 Essential (primary) hypertension: Secondary | ICD-10-CM | POA: Diagnosis not present

## 2021-12-03 DIAGNOSIS — Z48812 Encounter for surgical aftercare following surgery on the circulatory system: Secondary | ICD-10-CM | POA: Diagnosis not present

## 2021-12-03 DIAGNOSIS — R739 Hyperglycemia, unspecified: Secondary | ICD-10-CM | POA: Diagnosis not present

## 2021-12-03 NOTE — Progress Notes (Signed)
Daily Session Note ? ?Patient Details  ?Name: Alan Stewart ?MRN: [id number] ?Date of Birth: [birth year] ?Referring Provider:   ?Flowsheet Row Cardiac Rehab from 11/15/2021 in [facility] Cardiac and Pulmonary Rehab  ?Referring Provider Alan Sacramento MD  ? ?  ? ? ?Encounter Date: 12/03/2021 ? ?Check In: ? Session Check In - 12/03/21 0810   ? ?  ? Check-In  ? Supervising physician immediately available to respond to emergencies See telemetry face sheet for immediately available ER MD   ? Location [facility]-Cardiac & Pulmonary Rehab   ? Staff Present Heath Lark, RN, BSN, Laveda Norman, BS, ACSM CEP, Exercise Physiologist;Joseph [place], [place]   ? Virtual Visit No   ? Medication changes reported     No   ? Fall or balance concerns reported    No   ? Warm-up and Cool-down Performed on first and last piece of equipment   ? Resistance Training Performed Yes   ? VAD Patient? No   ? PAD/SET Patient? No   ?  ? Pain Assessment  ? Currently in Pain? No/denies   ? ?  ?  ? ?  ? ? ? ? ? ?Social History  ? ?Tobacco Use  ?Smoking Status Former  ? Packs/day: 1.50  ? Years: 10.00  ? Pack years: 15.00  ? Types: Cigarettes  ? Quit date: 07/19/1990  ? Years since quitting: 31.3  ?Smokeless Tobacco Former  ? ? ?Goals Met:  ?Independence with exercise equipment ?Exercise tolerated well ?No report of concerns or symptoms today ? ?Goals Unmet:  ?Not Applicable ? ?Comments: Pt able to follow exercise prescription today without complaint.  Will continue to monitor for progression. ? ? ? ?Dr. Emily Filbert is Medical Director for [name].  ?Dr. Ottie Glazier is Medical Director for [name] Pulmonary Rehabilitation. ?

## 2021-12-04 ENCOUNTER — Ambulatory Visit: Payer: Medicare HMO | Admitting: Nurse Practitioner

## 2021-12-04 NOTE — Progress Notes (Deleted)
Office Visit    Patient Name: Alan Stewart Date of Encounter: 12/04/2021  Primary Care Provider:  Adin Hector, MD Primary Cardiologist:  Alan Sacramento, MD  Chief Complaint    76 year old male with history of hypertension, hyperlipidemia, prediabetes, obesity, and obstructive sleep apnea, who presents for follow-up of CAD with non-STEMI in March 2023.  Past Medical History    Past Medical History:  Diagnosis Date   CAD (coronary artery disease)    a. 10/2021 NSTEMI/PCI: LM nl, LAD min irregs, LCX 74m(2.5x18 Onyx Frontier DES), OM1 nl, RCA 966m3.5x26 Onyx Frontier DES), 30d.   Diastolic dysfunction    a. 10/2021 Echo: EF 50-55%, no rwma, Gr1 DD, nl RV fxn. No significant valvular dzs.   Hyperglycemia    Hyperlipidemia    Hypertension    Morbid obesity ([facility]   Osteoarthritis    Sleep apnea    Past Surgical History:  Procedure Laterality Date   COLONOSCOPY N/A 10/09/2015   Procedure: COLONOSCOPY;  Surgeon: PaHulen LusterMD;  [facility];  Service: Gastroenterology;  Laterality: N/A;   CORONARY STENT INTERVENTION N/A 10/22/2021   Procedure: CORONARY STENT INTERVENTION;  Surgeon: ArWellington HampshireMD;  [facility];  Service: Cardiovascular;  Laterality: N/A;   LEFT HEART CATH AND CORONARY ANGIOGRAPHY N/A 10/22/2021   Procedure: LEFT HEART CATH AND CORONARY ANGIOGRAPHY;  Surgeon: ArWellington HampshireMD;  [facility];  Service: Cardiovascular;  Laterality: N/A;   ROTATOR CUFF REPAIR      Allergies  No Known Allergies  History of Present Illness    [age]ear old male with the above past medical history including hypertension, hyperlipidemia, prediabetes, obesity, obstructive sleep apnea, and recently diagnosed coronary artery disease.  [facility] with a 1 week history of intermittent chest pain and was found to have inferolateral ST and T changes with elevated high-sensitivity troponin.   Diagnostic catheterization revealed severe mid left circumflex and mid right coronary artery disease.  Both areas were successfully treated with drug-eluting stents.  Echocardiogram showed an EF of 50 to 55% with grade 1 diastolic dysfunction.  He was seen in cardiology clinic follow-up on March 15, at which time he was doing well without symptoms or limitations.    Since his last visit, [name] been participating in cardiac rehabilitation.  Home Medications    Current Outpatient Medications  Medication Sig Dispense Refill   aspirin EC 81 MG EC tablet Take 1 tablet (81 mg total) by mouth daily. Swallow whole. 30 tablet 11   atorvastatin (LIPITOR) 80 MG tablet Take 1 tablet (80 mg total) by mouth daily. 90 tablet 3   carvedilol (COREG) 6.25 MG tablet Take 1 tablet (6.25 mg total) by mouth 2 (two) times daily with a meal. 60 tablet 3   levothyroxine (SYNTHROID) 75 MCG tablet Take by mouth.     nitroGLYCERIN (NITROSTAT) 0.4 MG SL tablet Place 1 tablet (0.4 mg total) under the tongue every 5 (five) minutes as needed for chest pain. 30 tablet 12   ticagrelor (BRILINTA) 90 MG TABS tablet Take 1 tablet (90 mg total) by mouth 2 (two) times daily. 60 tablet 5   No current facility-administered medications for this visit.     Review of Systems    ***.  All other systems reviewed and are otherwise negative except as noted above.    Physical Exam    VS:  There  were no vitals taken for this visit. , BMI There is no height or weight on file to calculate BMI. STOP-Bang Score:  8  { Consider Dx Sleep Disordered Breathing or Sleep Apnea  ICD G47.33          :1}    GEN: Well nourished, well developed, in no acute distress. HEENT: normal. Neck: Supple, no JVD, carotid bruits, or masses. Cardiac: RRR, no murmurs, rubs, or gallops. No clubbing, cyanosis, edema.  Radials/DP/PT 2+ and equal bilaterally.  Respiratory:  Respirations regular and unlabored, clear to auscultation bilaterally. GI: Soft,  nontender, nondistended, BS + x 4. MS: no deformity or atrophy. Skin: warm and dry, no rash. Neuro:  Strength and sensation are intact. Psych: Normal affect.  Accessory Clinical Findings    ECG personally reviewed by me today - *** - no acute changes.  Lab Results  Component Value Date   WBC 5.3 10/22/2021   HGB 14.7 10/22/2021   HCT 44.2 10/22/2021   MCV 88.9 10/22/2021   PLT 210 10/22/2021   Lab Results  Component Value Date   CREATININE 1.09 10/22/2021   BUN 17 10/22/2021   NA 138 10/22/2021   K 4.0 10/22/2021   CL 105 10/22/2021   CO2 24 10/22/2021   Lab Results  Component Value Date   ALT 19 10/20/2021   AST 34 10/20/2021   ALKPHOS 79 10/20/2021   BILITOT 0.7 10/20/2021   Lab Results  Component Value Date   CHOL 180 10/20/2021   HDL 34 (L) 10/20/2021   LDLCALC 112 (H) 10/20/2021   TRIG 171 (H) 10/20/2021   CHOLHDL 5.3 10/20/2021    Lab Results  Component Value Date   HGBA1C 6.1 (H) 10/20/2021    Assessment & Plan    1.  ***   Alan Hodgkins, NP 12/04/2021, 9:51 AM

## 2021-12-05 ENCOUNTER — Encounter: Payer: Self-pay | Admitting: Nurse Practitioner

## 2021-12-05 ENCOUNTER — Encounter: Payer: Self-pay | Admitting: *Deleted

## 2021-12-05 DIAGNOSIS — I252 Old myocardial infarction: Secondary | ICD-10-CM | POA: Diagnosis not present

## 2021-12-05 DIAGNOSIS — Z48812 Encounter for surgical aftercare following surgery on the circulatory system: Secondary | ICD-10-CM | POA: Diagnosis not present

## 2021-12-05 DIAGNOSIS — I214 Non-ST elevation (NSTEMI) myocardial infarction: Secondary | ICD-10-CM

## 2021-12-05 DIAGNOSIS — Z955 Presence of coronary angioplasty implant and graft: Secondary | ICD-10-CM | POA: Diagnosis not present

## 2021-12-05 NOTE — Progress Notes (Signed)
Cardiac Individual Treatment Plan ? ?Patient Details  ?Name: Alan Stewart ?MRN: [id number] ?Date of Birth: [birth year] ?Referring Provider:   ?Flowsheet Row Cardiac Rehab [facility]  ?Referring Provider Kathlyn Sacramento MD  ? ?  ? ? ?Initial Encounter Date:  ?Flowsheet Row Cardiac Rehab from 11/15/2021 in [facility] Cardiac and Pulmonary Rehab  ?Date 11/15/21  ? ?  ? ? ?Visit Diagnosis: NSTEMI (non-ST elevation myocardial infarction) ([place]) ? ?Patient's Home Medications on Admission: ? ?Current Outpatient Medications:  ?  aspirin EC 81 MG EC tablet, Take 1 tablet (81 mg total) by mouth daily. Swallow whole., Disp: 30 tablet, Rfl: 11 ?  atorvastatin (LIPITOR) 80 MG tablet, Take 1 tablet (80 mg total) by mouth daily., Disp: 90 tablet, Rfl: 3 ?  carvedilol (COREG) 6.25 MG tablet, Take 1 tablet (6.25 mg total) by mouth 2 (two) times daily with a meal., Disp: 60 tablet, Rfl: 3 ?  levothyroxine (SYNTHROID) 75 MCG tablet, Take by mouth., Disp: , Rfl:  ?  nitroGLYCERIN (NITROSTAT) 0.4 MG SL tablet, Place 1 tablet (0.4 mg total) under the tongue every 5 (five) minutes as needed for chest pain., Disp: 30 tablet, Rfl: 12 ?  ticagrelor (BRILINTA) 90 MG TABS tablet, Take 1 tablet (90 mg total) by mouth 2 (two) times daily., Disp: 60 tablet, Rfl: 5 ? ?Past Medical History: ?Past Medical History:  ?Diagnosis Date  ? CAD (coronary artery disease)   ? a. 10/2021 NSTEMI/PCI: LM nl, LAD min irregs, LCX 35m(2.5x18 Onyx Frontier DES), OM1 nl, RCA 962m3.5x26 Onyx Frontier DES), 3093G ? Diastolic dysfunction   ? a. 10/2021 Echo: EF 50-55%, no rwma, Gr1 DD, nl RV fxn. No significant valvular dzs.  ? Hyperglycemia   ? Hyperlipidemia   ? Hypertension   ? Morbid obesity ([facility]  ? Osteoarthritis   ? Sleep apnea   ? ? ?Tobacco Use: ?Social History  ? ?Tobacco Use  ?Smoking Status Former  ? Packs/day: 1.50  ? Years: 10.00  ? Pack years: 15.00  ? Types: Cigarettes  ? Quit date: 07/19/1990  ? Years since quitting: 31.4   ?Smokeless Tobacco Former  ? ? ?Labs: ?Review Flowsheet   ? ?  ?  Latest Ref Rng & Units 10/20/2021  ?Labs for [facility] Cardiac and Pulmonary Rehab  ?Cholestrol 0 - 200 mg/dL 180    ?LDL (calc) 0 - 99 mg/dL 112    ?HDL-C >40 mg/dL 34    ?Trlycerides <150 mg/dL 171    ?Hemoglobin A1c 4.8 - 5.6 % 6.1    ?  ? ? Multiple values from one day are sorted in reverse-chronological order  ?  ?  ? ? ? ?Exercise Target Goals: ?Exercise Program Goal: ?Individual exercise prescription set using results from initial 6 min walk test and THRR while considering  patient?s activity barriers and safety.  ? ?Exercise Prescription Goal: ?Initial exercise prescription builds to 30-45 minutes a day of aerobic activity, 2-3 days per week.  Home exercise guidelines will be given to patient during program as part of exercise prescription that the participant will acknowledge. ? ? ?Education: Aerobic Exercise: ?- Group verbal and visual presentation on the components of exercise prescription. Introduces F.I.T.T principle from ACSM for exercise prescriptions.  Reviews F.I.T.T. principles of aerobic exercise including progression. Written material given at graduation. ?[facility]  ?Education need identified 11/15/21  ? ?  ? ? ?Education: Resistance Exercise: ?-  Group verbal and visual presentation on the components of exercise prescription. Introduces F.I.T.T principle from ACSM for exercise prescriptions  Reviews F.I.T.T. principles of resistance exercise including progression. Written material given at graduation. ? ?  ?Education: Exercise & Equipment Safety: ?- Individual verbal instruction and demonstration of equipment use and safety with use of the equipment. ?Flowsheet Row Cardiac Rehab from 12/05/2021 in [facility] Cardiac and Pulmonary Rehab  ?Date 11/08/21  ?Educator [facility]  ?Instruction Review Code 1- Verbalizes Understanding  ? ?  ? ? ?Education: Exercise Physiology & General  Exercise Guidelines: ?- Group verbal and written instruction with models to review the exercise physiology of the cardiovascular system and associated critical values. Provides general exercise guidelines with specific guidelines to those with heart or lung disease.  ? ? ?Education: Flexibility, Balance, Mind/Body Relaxation: ?- Group verbal and visual presentation with interactive activity on the components of exercise prescription. Introduces F.I.T.T principle from ACSM for exercise prescriptions. Reviews F.I.T.T. principles of flexibility and balance exercise training including progression. Also discusses the mind body connection.  Reviews various relaxation techniques to help reduce and manage stress (i.e. Deep breathing, progressive muscle relaxation, and visualization). Balance handout provided to take home. Written material given at graduation. ? ? ?Activity Barriers & Risk Stratification: ? Activity Barriers & Cardiac Risk Stratification - 11/15/21 1045   ? ?  ? Activity Barriers & Cardiac Risk Stratification  ? Activity Barriers Deconditioning;Muscular Weakness;Shortness of Breath;Balance Concerns;Arthritis   ? Cardiac Risk Stratification Moderate   ? ?  ?  ? ?  ? ? ?6 Minute Walk: ? 6 Minute Walk   ? ? [place] Name 11/15/21 1044  ?  ?  ?  ? 6 Minute Walk  ? Phase Initial    ? Distance 1078 feet    ? Walk Time 6 minutes    ? # of Rest Breaks 0    ? MPH 2.04    ? METS 1.39    ? RPE 13    ? Perceived Dyspnea  1    ? VO2 Peak 4.88    ? Symptoms Yes (comment)    ? Comments slightly SOB    ? Resting HR 50 bpm    ? Resting BP 132/72    ? Resting Oxygen Saturation  97 %    ? Exercise Oxygen Saturation  during 6 min walk 97 %    ? Max Ex. HR 79 bpm    ? Max Ex. BP 144/76    ? 2 Minute Post BP 124/60    ? ?  ?  ? ?  ? ? ?Oxygen Initial Assessment: ? ? ?Oxygen Re-Evaluation: ? ? ?Oxygen Discharge (Final Oxygen Re-Evaluation): ? ? ?Initial Exercise Prescription: ? Initial Exercise Prescription - 11/15/21 1000   ? ?  ?  Date of Initial Exercise RX and Referring Provider  ? Date 11/15/21   ? Referring Provider Kathlyn Sacramento MD   ?  ? Oxygen  ? Maintain Oxygen Saturation 88% or higher   ?  ? Treadmill  ? MPH 1.8   ? Grade 0.5   ? Minutes 15   ? METs 2.5   ?  ? Arm Ergometer  ? Level 1   ? Watts 25   ? RPM 25   ? Minutes 15   ? METs 1.5   ?  ? REL-XR  ? Level 1   ? Speed 50   ? Minutes 15   ? METs 2   ?  ? Track  ?  Laps 28   ? Minutes 15   ? METs 2.52   ?  ? Prescription Details  ? Frequency (times per week) 3   ? Duration Progress to 30 minutes of continuous aerobic without signs/symptoms of physical distress   ?  ? Intensity  ? THRR 40-80% of Max Heartrate 89-128   ? Ratings of Perceived Exertion 11-13   ? Perceived Dyspnea 0-4   ?  ? Progression  ? Progression Continue to progress workloads to maintain intensity without signs/symptoms of physical distress.   ?  ? Resistance Training  ? Training Prescription Yes   ? Weight 4 lb   ? Reps 10-15   ? ?  ?  ? ?  ? ? ?Perform Capillary Blood Glucose checks as needed. ? ?Exercise Prescription Changes: ? ? Exercise Prescription Changes   ? ? [place] Name 11/15/21 1000 12/03/21 1100  ?  ?  ?  ?  ? Response to Exercise  ? Blood Pressure (Admit) 132/72 124/70     ? Blood Pressure (Exercise) 144/76 152/82     ? Blood Pressure (Exit) 124/60 122/78     ? Heart Rate (Admit) 50 bpm 66 bpm     ? Heart Rate (Exercise) 79 bpm 104 bpm     ? Heart Rate (Exit) 56 bpm 89 bpm     ? Oxygen Saturation (Admit) 97 % --     ? Oxygen Saturation (Exercise) 97 % --     ? Rating of Perceived Exertion (Exercise) 13 14     ? Perceived Dyspnea (Exercise) 1 --     ? Symptoms SOB none     ? Comments walk test results 2nd full week of exercise     ? Duration -- Progress to 30 minutes of  aerobic without signs/symptoms of physical distress     ? Intensity -- THRR unchanged     ?  ? Progression  ? Progression -- Continue to progress workloads to maintain intensity without signs/symptoms of physical distress.     ? Average  METs -- 2.9     ?  ? Resistance Training  ? Training Prescription -- Yes     ? Weight -- 5 lb     ? Reps -- 10-15     ?  ? Interval Training  ? Interval Training -- No     ?  ? Treadmill  ? MPH -- 2.2     ? Grade

## 2021-12-05 NOTE — Progress Notes (Signed)
Daily Session Note ? ?Patient Details  ?Name: Alan Stewart ?MRN: [id number] ?Date of Birth: [birth year] ?Referring Provider:   ?Flowsheet Row Cardiac Rehab from 11/15/2021 in [facility] Cardiac and Pulmonary Rehab  ?Referring Provider Kathlyn Sacramento MD  ? ?  ? ? ?Encounter Date: 12/05/2021 ? ?Check In: ? Session Check In - 12/05/21 0738   ? ?  ? Check-In  ? Supervising physician immediately available to respond to emergencies See telemetry face sheet for immediately available ER MD   ? Location [facility]-Cardiac & Pulmonary Rehab   ? Staff Present Birdie Sons, MPA, RN;Amanda Sommer, BA, ACSM CEP, Exercise Physiologist;Joseph [place], [place]   ? Virtual Visit No   ? Medication changes reported     No   ? Fall or balance concerns reported    No   ? Warm-up and Cool-down Performed on first and last piece of equipment   ? Resistance Training Performed Yes   ? VAD Patient? No   ? PAD/SET Patient? No   ?  ? Pain Assessment  ? Currently in Pain? No/denies   ? ?  ?  ? ?  ? ? ? ? ? ?Social History  ? ?Tobacco Use  ?Smoking Status Former  ? Packs/day: 1.50  ? Years: 10.00  ? Pack years: 15.00  ? Types: Cigarettes  ? Quit date: 07/19/1990  ? Years since quitting: 31.4  ?Smokeless Tobacco Former  ? ? ?Goals Met:  ?Independence with exercise equipment ?Exercise tolerated well ?No report of concerns or symptoms today ?Strength training completed today ? ?Goals Unmet:  ?Not Applicable ? ?Comments: Pt able to follow exercise prescription today without complaint.  Will continue to monitor for progression. ? ? ? ? ? ?Dr. Emily Filbert is Medical Director for [place].  ?Dr. Ottie Glazier is Medical Director for [facility] Pulmonary Rehabilitation. ?

## 2021-12-07 ENCOUNTER — Encounter: Payer: Medicare HMO | Admitting: *Deleted

## 2021-12-07 DIAGNOSIS — I214 Non-ST elevation (NSTEMI) myocardial infarction: Secondary | ICD-10-CM

## 2021-12-07 DIAGNOSIS — I252 Old myocardial infarction: Secondary | ICD-10-CM | POA: Diagnosis not present

## 2021-12-07 DIAGNOSIS — Z955 Presence of coronary angioplasty implant and graft: Secondary | ICD-10-CM | POA: Diagnosis not present

## 2021-12-07 DIAGNOSIS — Z48812 Encounter for surgical aftercare following surgery on the circulatory system: Secondary | ICD-10-CM | POA: Diagnosis not present

## 2021-12-07 NOTE — Progress Notes (Signed)
Daily Session Note ? ?Patient Details  ?Name: Alan Stewart ?MRN: [id number] ?Date of Birth: 12-17-1945 ?Referring Provider:   ?Flowsheet Row Cardiac Rehab from 11/15/2021 in [facility] Cardiac and Pulmonary Rehab  ?Referring Provider Kathlyn Sacramento MD  ? ?  ? ? ?Encounter Date: 12/07/2021 ? ?Check In: ? Session Check In - 12/07/21 0757   ? ?  ? Check-In  ? Supervising physician immediately available to respond to emergencies See telemetry face sheet for immediately available ER MD   ? Location [facility]-Cardiac & Pulmonary Rehab   ? Staff Present Heath Lark, RN, BSN, CCRP;Joseph [place], [place], [place], [place], [place], CCET   ? Virtual Visit No   ? Medication changes reported     No   ? Fall or balance concerns reported    No   ? Warm-up and Cool-down Performed on first and last piece of equipment   ? Resistance Training Performed Yes   ? VAD Patient? No   ? PAD/SET Patient? No   ?  ? Pain Assessment  ? Currently in Pain? No/denies   ? ?  ?  ? ?  ? ? ? ? ? ?Social History  ? ?Tobacco Use  ?Smoking Status Former  ? Packs/day: 1.50  ? Years: 10.00  ? Pack years: 15.00  ? Types: Cigarettes  ? Quit date: 07/19/1990  ? Years since quitting: 31.4  ?Smokeless Tobacco Former  ? ? ?Goals Met:  ?Independence with exercise equipment ?Exercise tolerated well ?No report of concerns or symptoms today ? ?Goals Unmet:  ?Not Applicable ? ?Comments: Pt able to follow exercise prescription today without complaint.  Will continue to monitor for progression. ? ? ? ?Dr. Emily Filbert is Medical Director for [place].  ?Dr. Ottie Glazier is Medical Director for [facility] Pulmonary Rehabilitation. ?

## 2021-12-10 ENCOUNTER — Encounter: Payer: Medicare HMO | Admitting: *Deleted

## 2021-12-10 DIAGNOSIS — I252 Old myocardial infarction: Secondary | ICD-10-CM | POA: Diagnosis not present

## 2021-12-10 DIAGNOSIS — Z48812 Encounter for surgical aftercare following surgery on the circulatory system: Secondary | ICD-10-CM | POA: Diagnosis not present

## 2021-12-10 DIAGNOSIS — Z955 Presence of coronary angioplasty implant and graft: Secondary | ICD-10-CM | POA: Diagnosis not present

## 2021-12-10 DIAGNOSIS — I214 Non-ST elevation (NSTEMI) myocardial infarction: Secondary | ICD-10-CM

## 2021-12-10 NOTE — Progress Notes (Signed)
Daily Session Note ? ?Patient Details  ?Name: Gianni Mihalik Venn ?MRN: [id number] ?Date of Birth: 13-Nov-1945 ?Referring Provider:   ?Flowsheet Row Cardiac Rehab from 11/15/2021 in [facility] Cardiac and Pulmonary Rehab  ?Referring Provider Kathlyn Sacramento MD  ? ?  ? ? ?Encounter Date: 12/10/2021 ? ?Check In: ? Session Check In - 12/10/21 0805   ? ?  ? Check-In  ? Supervising physician immediately available to respond to emergencies See telemetry face sheet for immediately available ER MD   ? Location [facility]-Cardiac & Pulmonary Rehab   ? Staff Present Justin Mend, RCP,RRT,BSRT;Kelly Amedeo Plenty, BS, ACSM CEP, Exercise Physiologist;Jaymir Struble, RN, BSN, CCRP   ? Virtual Visit No   ? Medication changes reported     No   ? Warm-up and Cool-down Performed on first and last piece of equipment   ? Resistance Training Performed Yes   ? VAD Patient? No   ? PAD/SET Patient? No   ?  ? Pain Assessment  ? Currently in Pain? No/denies   ? ?  ?  ? ?  ? ? ? ? ? ?Social History  ? ?Tobacco Use  ?Smoking Status Former  ? Packs/day: 1.50  ? Years: 10.00  ? Pack years: 15.00  ? Types: Cigarettes  ? Quit date: 07/19/1990  ? Years since quitting: 31.4  ?Smokeless Tobacco Former  ? ? ?Goals Met:  ?Independence with exercise equipment ?Exercise tolerated well ?No report of concerns or symptoms today ? ?Goals Unmet:  ?Not Applicable ? ?Comments: Pt able to follow exercise prescription today without complaint.  Will continue to monitor for progression. ? ? ? ?Dr. Emily Filbert is Medical Director for [place].  ?Dr. Ottie Glazier is Medical Director for [facility] Pulmonary Rehabilitation. ?

## 2021-12-12 DIAGNOSIS — Z955 Presence of coronary angioplasty implant and graft: Secondary | ICD-10-CM | POA: Diagnosis not present

## 2021-12-12 DIAGNOSIS — Z48812 Encounter for surgical aftercare following surgery on the circulatory system: Secondary | ICD-10-CM | POA: Diagnosis not present

## 2021-12-12 DIAGNOSIS — I252 Old myocardial infarction: Secondary | ICD-10-CM | POA: Diagnosis not present

## 2021-12-12 DIAGNOSIS — I214 Non-ST elevation (NSTEMI) myocardial infarction: Secondary | ICD-10-CM

## 2021-12-12 NOTE — Progress Notes (Signed)
Daily Session Note ? ?Patient Details  ?Name: Gerron Guidotti Spiker ?MRN: [id number] ?Date of Birth: 15-Oct-1945 ?Referring Provider:   ?Flowsheet Row Cardiac Rehab from 11/15/2021 in [facility] Cardiac and Pulmonary Rehab  ?Referring Provider Kathlyn Sacramento MD  ? ?  ? ? ?Encounter Date: 12/12/2021 ? ?Check In: ? Session Check In - 12/12/21 0731   ? ?  ? Check-In  ? Supervising physician immediately available to respond to emergencies See telemetry face sheet for immediately available ER MD   ? Location [facility]-Cardiac & Pulmonary Rehab   ? Staff Present Birdie Sons, MPA, RN;Jessica [place], MA, RCEP, CCRP, Marylynn Pearson, MS, ASCM CEP, Exercise Physiologist;Joseph [place], [place]   ? Virtual Visit No   ? Medication changes reported     No   ? Fall or balance concerns reported    No   ? Warm-up and Cool-down Performed on first and last piece of equipment   ? Resistance Training Performed Yes   ? VAD Patient? No   ? PAD/SET Patient? No   ?  ? Pain Assessment  ? Currently in Pain? No/denies   ? ?  ?  ? ?  ? ? ? ? ? ?Social History  ? ?Tobacco Use  ?Smoking Status Former  ? Packs/day: 1.50  ? Years: 10.00  ? Pack years: 15.00  ? Types: Cigarettes  ? Quit date: 07/19/1990  ? Years since quitting: 31.4  ?Smokeless Tobacco Former  ? ? ?Goals Met:  ?Independence with exercise equipment ?Exercise tolerated well ?No report of concerns or symptoms today ?Strength training completed today ? ?Goals Unmet:  ?Not Applicable ? ?Comments: Pt able to follow exercise prescription today without complaint.  Will continue to monitor for progression. ? ? ? ?Dr. Emily Filbert is Medical Director for [place].  ?Dr. Ottie Glazier is Medical Director for [facility] Pulmonary Rehabilitation. ?

## 2021-12-14 ENCOUNTER — Encounter: Payer: Medicare HMO | Admitting: *Deleted

## 2021-12-14 DIAGNOSIS — Z48812 Encounter for surgical aftercare following surgery on the circulatory system: Secondary | ICD-10-CM | POA: Diagnosis not present

## 2021-12-14 DIAGNOSIS — I252 Old myocardial infarction: Secondary | ICD-10-CM | POA: Diagnosis not present

## 2021-12-14 DIAGNOSIS — I214 Non-ST elevation (NSTEMI) myocardial infarction: Secondary | ICD-10-CM

## 2021-12-14 DIAGNOSIS — Z955 Presence of coronary angioplasty implant and graft: Secondary | ICD-10-CM | POA: Diagnosis not present

## 2021-12-14 NOTE — Progress Notes (Signed)
Daily Session Note ? ?Patient Details  ?Name: Alan Stewart ?MRN: [id number] ?Date of Birth: 1946-01-13 ?Referring Provider:   ?Flowsheet Row Cardiac Rehab from 11/15/2021 in [facility] Cardiac and Pulmonary Rehab  ?Referring Provider Kathlyn Sacramento MD  ? ?  ? ? ?Encounter Date: 12/14/2021 ? ?Check In: ? Session Check In - 12/14/21 0730   ? ?  ? Check-In  ? Supervising physician immediately available to respond to emergencies See telemetry face sheet for immediately available ER MD   ? Location [facility]-Cardiac & Pulmonary Rehab   ? Staff Present Heath Lark, RN, BSN, CCRP;Joseph [place], [place], [place], [place], [place], CCET   ? Virtual Visit No   ? Medication changes reported     No   ? Fall or balance concerns reported    No   ? Warm-up and Cool-down Performed on first and last piece of equipment   ? Resistance Training Performed Yes   ? VAD Patient? No   ? PAD/SET Patient? No   ?  ? Pain Assessment  ? Currently in Pain? No/denies   ? ?  ?  ? ?  ? ? ? ? ? ?Social History  ? ?Tobacco Use  ?Smoking Status Former  ? Packs/day: 1.50  ? Years: 10.00  ? Pack years: 15.00  ? Types: Cigarettes  ? Quit date: 07/19/1990  ? Years since quitting: 31.4  ?Smokeless Tobacco Former  ? ? ?Goals Met:  ?Independence with exercise equipment ?Exercise tolerated well ?No report of concerns or symptoms today ? ?Goals Unmet:  ?Not Applicable ? ?Comments: Pt able to follow exercise prescription today without complaint.  Will continue to monitor for progression. ? ? ? ?Dr. Emily Filbert is Medical Director for [place].  ?Dr. Ottie Glazier is Medical Director for [facility] Pulmonary Rehabilitation. ?

## 2021-12-17 ENCOUNTER — Encounter: Payer: Medicare HMO | Attending: Cardiovascular Disease | Admitting: *Deleted

## 2021-12-17 DIAGNOSIS — Z955 Presence of coronary angioplasty implant and graft: Secondary | ICD-10-CM | POA: Insufficient documentation

## 2021-12-17 DIAGNOSIS — I252 Old myocardial infarction: Secondary | ICD-10-CM | POA: Diagnosis not present

## 2021-12-17 DIAGNOSIS — I214 Non-ST elevation (NSTEMI) myocardial infarction: Secondary | ICD-10-CM

## 2021-12-17 NOTE — Progress Notes (Signed)
Daily Session Note ? ?Patient Details  ?Name: Alan Stewart ?MRN: [id number] ?Date of Birth: 1945/10/14 ?Referring Provider:   ?Flowsheet Row Cardiac Rehab from 11/15/2021 in [facility] Cardiac and Pulmonary Rehab  ?Referring Provider Kathlyn Sacramento MD  ? ?  ? ? ?Encounter Date: 12/17/2021 ? ?Check In: ? Session Check In - 12/17/21 0814   ? ?  ? Check-In  ? Supervising physician immediately available to respond to emergencies See telemetry face sheet for immediately available ER MD   ? Location [facility]-Cardiac & Pulmonary Rehab   ? Staff Present Justin Mend, RCP,RRT,BSRT;Kelly Amedeo Plenty, BS, ACSM CEP, Exercise Physiologist;Bettie [place], RN, BSN, CCRP   ? Virtual Visit No   ? Medication changes reported     No   ? Fall or balance concerns reported    No   ? Warm-up and Cool-down Performed on first and last piece of equipment   ? Resistance Training Performed Yes   ? VAD Patient? No   ? PAD/SET Patient? No   ?  ? Pain Assessment  ? Currently in Pain? No/denies   ? ?  ?  ? ?  ? ? ? ? ? ?Social History  ? ?Tobacco Use  ?Smoking Status Former  ? Packs/day: 1.50  ? Years: 10.00  ? Pack years: 15.00  ? Types: Cigarettes  ? Quit date: 07/19/1990  ? Years since quitting: 31.4  ?Smokeless Tobacco Former  ? ? ?Goals Met:  ?Independence with exercise equipment ?Exercise tolerated well ?No report of concerns or symptoms today ? ?Goals Unmet:  ?Not Applicable ? ?Comments: Pt able to follow exercise prescription today without complaint.  Will continue to monitor for progression. ? ? ? ?Dr. Emily Filbert is Medical Director for [place].  ?Dr. Ottie Glazier is Medical Director for [facility] Pulmonary Rehabilitation. ?

## 2021-12-18 ENCOUNTER — Ambulatory Visit: Payer: Medicare HMO | Admitting: Nurse Practitioner

## 2021-12-18 ENCOUNTER — Other Ambulatory Visit
Admission: RE | Admit: 2021-12-18 | Discharge: 2021-12-18 | Disposition: A | Discharge status: Home / Self Care | Payer: Medicare HMO | Attending: Nurse Practitioner | Admitting: Nurse Practitioner

## 2021-12-18 ENCOUNTER — Encounter: Payer: Self-pay | Admitting: Nurse Practitioner

## 2021-12-18 ENCOUNTER — Other Ambulatory Visit: Payer: Self-pay | Admitting: *Deleted

## 2021-12-18 VITALS — BP 150/82 | HR 53 | Ht 67.0 in | Wt 269.4 lb

## 2021-12-18 DIAGNOSIS — E785 Hyperlipidemia, unspecified: Secondary | ICD-10-CM | POA: Diagnosis not present

## 2021-12-18 DIAGNOSIS — I214 Non-ST elevation (NSTEMI) myocardial infarction: Secondary | ICD-10-CM | POA: Insufficient documentation

## 2021-12-18 DIAGNOSIS — Z9861 Coronary angioplasty status: Secondary | ICD-10-CM | POA: Insufficient documentation

## 2021-12-18 DIAGNOSIS — I2510 Atherosclerotic heart disease of native coronary artery without angina pectoris: Secondary | ICD-10-CM

## 2021-12-18 DIAGNOSIS — I10 Essential (primary) hypertension: Secondary | ICD-10-CM

## 2021-12-18 LAB — LIPID PANEL
Cholesterol: 133 mg/dL (ref 0–200)
HDL: 33 mg/dL — ABNORMAL LOW (ref >40)
LDL Cholesterol: 77 mg/dL (ref 0–99)
Total CHOL/HDL Ratio: 4 RATIO
Triglycerides: 114 mg/dL (ref <150)
VLDL: 23 mg/dL (ref 0–40)

## 2021-12-18 LAB — HEPATIC FUNCTION PANEL
ALT: 23 U/L (ref 0–44)
AST: 25 U/L (ref 15–41)
Albumin: 3.5 g/dL (ref 3.5–5.0)
Alkaline Phosphatase: 105 U/L (ref 38–126)
Bilirubin, Direct: 0.2 mg/dL (ref 0.0–0.2)
Indirect Bilirubin: 1 mg/dL — ABNORMAL HIGH (ref 0.3–0.9)
Total Bilirubin: 1.2 mg/dL (ref 0.3–1.2)
Total Protein: 7.1 g/dL (ref 6.5–8.1)

## 2021-12-18 MED ORDER — EZETIMIBE 10 MG PO TABS: 10.0000 mg | ORAL_TABLET | Freq: Every day | ORAL | 3 refills | Status: DC

## 2021-12-18 NOTE — Patient Instructions (Addendum)
Medication Instructions:  ?No changes at this time.  ? ?*If you need a refill on your cardiac medications before your next appointment, please call your pharmacy* ? ? ?Lab Work: ?Lipid & Liver panel to be done today. Go to [facility] entrance and check in at registration.  ? ?If you have labs (blood work) drawn today and your tests are completely normal, you will receive your results only by: ?MyChart Message (if you have MyChart) OR ?A paper copy in the mail ?If you have any lab test that is abnormal or we need to change your treatment, we will call you to review the results. ? ? ?Testing/Procedures: ?None ? ? ?Follow-Up: ?At [facility], you and your health needs are our priority.  As part of our continuing mission to provide you with exceptional heart care, we have created designated Provider Care Teams.  These Care Teams include your primary Cardiologist (physician) and Advanced Practice Providers (APPs -  Physician Assistants and Nurse Practitioners) who all work together to provide you with the care you need, when you need it. ? ? ?Your next appointment:   ?3 month(s) ? ?The format for your next appointment:   ?In Person ? ?Provider:   ?Kathlyn Sacramento, MD  ? ? ? ? ? ? ?Important Information About Sugar ? ? ? ? ?  ?

## 2021-12-18 NOTE — Progress Notes (Signed)
? ? ?Office Visit  ?  ?Patient Name: Alan Stewart ?Date of Encounter: 12/18/2021 ? ?Primary Care Provider:  Adin Hector, MD ?Primary Cardiologist:  Kathlyn Sacramento, MD ? ?Chief Complaint  ?  ?76 year old male with history of hypertension, hyperlipidemia, prediabetes, obesity, and obstructive sleep apnea, who presents for follow-up of CAD following non-STEMI in March 2023. ? ?Past Medical History  ?  ?Past Medical History:  ?Diagnosis Date  ? CAD (coronary artery disease)   ? a. 10/2021 NSTEMI/PCI: LM nl, LAD min irregs, LCX 43m(2.5x18 Onyx Frontier DES), OM1 nl, RCA 936m3.5x26 Onyx Frontier DES), 3016X ? Diastolic dysfunction   ? a. 10/2021 Echo: EF 50-55%, no rwma, Gr1 DD, nl RV fxn. No significant valvular dzs.  ? Hyperglycemia   ? Hyperlipidemia   ? Hypertension   ? Morbid obesity (HCCelina  ? Osteoarthritis   ? Sleep apnea   ? ?Past Surgical History:  ?Procedure Laterality Date  ? COLONOSCOPY N/A 10/09/2015  ? Procedure: COLONOSCOPY;  Surgeon: PaHulen LusterMD;  [facility];  Service: Gastroenterology;  Laterality: N/A;  ? CORONARY STENT INTERVENTION N/A 10/22/2021  ? Procedure: CORONARY STENT INTERVENTION;  Surgeon: ArWellington HampshireMD;  [facility];  Service: Cardiovascular;  Laterality: N/A;  ? LEFT HEART CATH AND CORONARY ANGIOGRAPHY N/A 10/22/2021  ? Procedure: LEFT HEART CATH AND CORONARY ANGIOGRAPHY;  Surgeon: ArWellington HampshireMD;  [facility];  Service: Cardiovascular;  Laterality: N/A;  ? ROTATOR CUFF REPAIR    ? ? ?Allergies ? ?No Known Allergies ? ?History of Present Illness  ?  ?[age]ear old male with above past medical history including CAD, hypertension, hyperlipidemia, prediabetes, obesity, and obstructive sleep apnea.  [facility] 1 week history of intermittent chest pain, which was more persistent prior to arrival.  ECG showed inferolateral ST and T changes and troponin peaked at 4253.  Diagnostic  catheterization revealed severe mid left circumflex and mid RCA disease.  Both areas were successfully treated with drug-eluting stents.  Echo showed an EF of 50 to 55% with grade 1 diastolic dysfunction. ? ?Alan Stewart was last seen in cardiology clinic on March 15, at which time he was doing well, and he agreed to participate in cardiac rehabilitation, which she has since been attending and tolerating well.  He denies chest pain, palpitations, dyspnea, pnd, orthopnea, n, v, dizziness, syncope, edema, weight gain, or early satiety.  His chest pain is elevated today.  He believes that his pressures have been normal at cardiac rehabilitation, or at least he has not been told they are elevated.  Excuse me ? ?Home Medications  ?  ?Current Outpatient Medications  ?Medication Sig Dispense Refill  ? aspirin EC 81 MG EC tablet Take 1 tablet (81 mg total) by mouth daily. Swallow whole. 30 tablet 11  ? atorvastatin (LIPITOR) 80 MG tablet Take 1 tablet (80 mg total) by mouth daily. 90 tablet 3  ? carvedilol (COREG) 6.25 MG tablet Take 1 tablet (6.25 mg total) by mouth 2 (two) times daily with a meal. 60 tablet 3  ? levothyroxine (SYNTHROID) 75 MCG tablet Take 75 mcg by mouth daily before breakfast.    ? nitroGLYCERIN (NITROSTAT) 0.4 MG SL tablet Place 1 tablet (0.4 mg total) under the tongue every 5 (five) minutes as needed for chest pain. 30 tablet 12  ? ticagrelor (BRILINTA) 90 MG TABS tablet Take 1 tablet (90  mg total) by mouth 2 (two) times daily. 60 tablet 5  ? ?No current facility-administered medications for this visit.  ?  ? ?Review of Systems  ?  ?He denies chest pain, palpitations, dyspnea, pnd, orthopnea, n, v, dizziness, syncope, edema, weight gain, or early satiety.  All other systems reviewed and are otherwise negative except as noted above. ?  ? ?Physical Exam  ?  ?VS:  BP (!) 150/82   Pulse (!) 53   Ht '5\' 7"'$  (1.702 m)   Wt 269 lb 6 oz (122.2 kg)   SpO2 97%   BMI 42.19 kg/m?  , BMI Body mass index is 42.19  kg/m?. ?Vitals:  ? 12/18/21 0957 12/18/21 1212  ?BP: (!) 150/78 (!) 150/82  ?Pulse: (!) 53   ?SpO2: 97%   ?  ?STOP-Bang Score:  8      ?GEN: Obese, in no acute distress. ?HEENT: normal. ?Neck: Supple, no JVD, carotid bruits, or masses. ?Cardiac: RRR, no murmurs, rubs, or gallops. No clubbing, cyanosis, edema.  Radials/PT 2+ and equal bilaterally.  ?Respiratory:  Respirations regular and unlabored, clear to auscultation bilaterally. ?GI: Soft, nontender, nondistended, BS + x 4. ?MS: no deformity or atrophy. ?Skin: warm and dry, no rash. ?Neuro:  Strength and sensation are intact. ?Psych: Normal affect. ? ?Accessory Clinical Findings  ?  ? ?Lab Results  ?Component Value Date  ? WBC 5.3 10/22/2021  ? HGB 14.7 10/22/2021  ? HCT 44.2 10/22/2021  ? MCV 88.9 10/22/2021  ? PLT 210 10/22/2021  ? ?Lab Results  ?Component Value Date  ? CREATININE 1.09 10/22/2021  ? BUN 17 10/22/2021  ? NA 138 10/22/2021  ? K 4.0 10/22/2021  ? CL 105 10/22/2021  ? CO2 24 10/22/2021  ? ?Lab Results  ?Component Value Date  ? ALT 23 12/18/2021  ? AST 25 12/18/2021  ? ALKPHOS 105 12/18/2021  ? BILITOT 1.2 12/18/2021  ? ?Lab Results  ?Component Value Date  ? CHOL 133 12/18/2021  ? HDL 33 (L) 12/18/2021  ? Driggs 77 12/18/2021  ? TRIG 114 12/18/2021  ? CHOLHDL 4.0 12/18/2021  ?  ?Lab Results  ?Component Value Date  ? HGBA1C 6.1 (H) 10/20/2021  ? ? ?Assessment & Plan  ?  ?1.  Coronary artery disease: Status post non-STEMI in March 2023 with diagnostic catheterization revealing severe mid left circumflex and mid RCA disease, with both areas successfully treated with drug-eluting stents.  EF 50 to 55% with grade 1 diastolic dysfunction by echo at that time.  He has done well since his last visit in March and has been exercising 3 times a week at cardiac rehab without symptoms or limitations.  He remains on aspirin, beta-blocker, Brilinta, and high potency statin therapy.  Lipids today show an LDL of 77 with normal LFTs.  We will plan to add Zetia 10 mg  daily. ? ?2.  Essential hypertension: Blood pressure elevated today with systolics in the 814G.  I have reviewed the scans of his vital signs during cardiac rehab and note that he typically runs in the 120s.  In that setting, I am not going to make any changes to his medications today.  We will continue to track blood pressures at cardiac rehab. ? ?3.  Hyperlipidemia: As noted above, lipids checked today with an LDL of 77.  LFTs looked good.  We will add Zetia 10 mg daily. ? ?4.  Obstructive sleep apnea: STOP-BANG of 8.  Home sleep study ordered by primary care. ? ?5.  Morbid obesity: Patient exercising also making dietary changes.  Weight is down about 3 pounds since hospitalization. ? ?6.  Prediabetes: A1c 6.1 in March.  Continues to work on diet and exercise. ? ?7.  Disposition: Follow-up in clinic in 3 months or sooner if necessary. ? ?Murray Hodgkins, NP ?12/18/2021, 12:12 PM ? ?

## 2021-12-19 DIAGNOSIS — I214 Non-ST elevation (NSTEMI) myocardial infarction: Secondary | ICD-10-CM

## 2021-12-19 DIAGNOSIS — Z955 Presence of coronary angioplasty implant and graft: Secondary | ICD-10-CM | POA: Diagnosis not present

## 2021-12-19 DIAGNOSIS — I252 Old myocardial infarction: Secondary | ICD-10-CM | POA: Diagnosis not present

## 2021-12-19 NOTE — Progress Notes (Signed)
Daily Session Note ? ?Patient Details  ?Name: Alan Stewart ?MRN: [id number] ?Date of Birth: April 10, 1946 ?Referring Provider:   ?Flowsheet Row Cardiac Rehab from 11/15/2021 in [facility] Cardiac and Pulmonary Rehab  ?Referring Provider Kathlyn Sacramento MD  ? ?  ? ? ?Encounter Date: 12/19/2021 ? ?Check In: ? Session Check In - 12/19/21 0733   ? ?  ? Check-In  ? Supervising physician immediately available to respond to emergencies See telemetry face sheet for immediately available ER MD   ? Location [facility]-Cardiac & Pulmonary Rehab   ? Staff Present Birdie Sons, MPA, RN;Melissa [place], RDN, LDN;Joseph Serena, RCP,RRT,BSRT   ? Virtual Visit No   ? Medication changes reported     Yes   ? Comments added 58m ezetimibe daily   ? Fall or balance concerns reported    No   ? Warm-up and Cool-down Performed on first and last piece of equipment   ? Resistance Training Performed Yes   ? VAD Patient? No   ? PAD/SET Patient? No   ?  ? Pain Assessment  ? Currently in Pain? No/denies   ? ?  ?  ? ?  ? ? ? ? ? ?Social History  ? ?Tobacco Use  ?Smoking Status Former  ? Packs/day: 1.50  ? Years: 10.00  ? Pack years: 15.00  ? Types: Cigarettes  ? Quit date: 07/19/1990  ? Years since quitting: 31.4  ?Smokeless Tobacco Former  ? ? ?Goals Met:  ?Independence with exercise equipment ?Exercise tolerated well ?No report of concerns or symptoms today ?Strength training completed today ? ?Goals Unmet:  ?Not Applicable ? ?Comments: Pt able to follow exercise prescription today without complaint.  Will continue to monitor for progression. ? ? ? ?Dr. MEmily Filbertis Medical Director for [facility]  ?Dr. FOttie Glazieris Medical Director for [facility]. ?

## 2021-12-21 ENCOUNTER — Encounter: Payer: Medicare HMO | Admitting: *Deleted

## 2021-12-21 DIAGNOSIS — I214 Non-ST elevation (NSTEMI) myocardial infarction: Secondary | ICD-10-CM

## 2021-12-21 DIAGNOSIS — I252 Old myocardial infarction: Secondary | ICD-10-CM | POA: Diagnosis not present

## 2021-12-21 DIAGNOSIS — Z955 Presence of coronary angioplasty implant and graft: Secondary | ICD-10-CM | POA: Diagnosis not present

## 2021-12-21 NOTE — Progress Notes (Signed)
Daily Session Note ? ?Patient Details  ?Name: Alan Stewart ?MRN: [id number] ?Date of Birth: 16-Jan-1946 ?Referring Provider:   ?Flowsheet Row Cardiac Rehab from 11/15/2021 in [facility] Cardiac and Pulmonary Rehab  ?Referring Provider Kathlyn Sacramento MD  ? ?  ? ? ?Encounter Date: 12/21/2021 ? ?Check In: ? Session Check In - 12/21/21 1443   ? ?  ? Check-In  ? Supervising physician immediately available to respond to emergencies See telemetry face sheet for immediately available ER MD   ? Location [facility]-Cardiac & Pulmonary Rehab   ? Staff Present Heath Lark, RN, BSN, CCRP;Jessica [place], MA, RCEP, CCRP, CCET;Joseph [place], [place]   ? Virtual Visit No   ? Medication changes reported     No   ? Fall or balance concerns reported    No   ? Warm-up and Cool-down Performed on first and last piece of equipment   ? Resistance Training Performed Yes   ? VAD Patient? No   ? PAD/SET Patient? No   ?  ? Pain Assessment  ? Currently in Pain? No/denies   ? ?  ?  ? ?  ? ? ? ? ? ?Social History  ? ?Tobacco Use  ?Smoking Status Former  ? Packs/day: 1.50  ? Years: 10.00  ? Pack years: 15.00  ? Types: Cigarettes  ? Quit date: 07/19/1990  ? Years since quitting: 31.4  ?Smokeless Tobacco Former  ? ? ?Goals Met:  ?Independence with exercise equipment ?Exercise tolerated well ?No report of concerns or symptoms today ? ?Goals Unmet:  ?Not Applicable ? ?Comments: Pt able to follow exercise prescription today without complaint.  Will continue to monitor for progression. ? ? ? ?Dr. Emily Filbert is Medical Director for [place].  ?Dr. Ottie Glazier is Medical Director for [facility] Pulmonary Rehabilitation. ?

## 2021-12-24 ENCOUNTER — Encounter: Payer: Medicare HMO | Admitting: *Deleted

## 2021-12-24 DIAGNOSIS — I252 Old myocardial infarction: Secondary | ICD-10-CM | POA: Diagnosis not present

## 2021-12-24 DIAGNOSIS — I214 Non-ST elevation (NSTEMI) myocardial infarction: Secondary | ICD-10-CM

## 2021-12-24 DIAGNOSIS — Z955 Presence of coronary angioplasty implant and graft: Secondary | ICD-10-CM | POA: Diagnosis not present

## 2021-12-24 NOTE — Progress Notes (Signed)
Daily Session Note ? ?Patient Details  ?Name: Alan Stewart ?MRN: [id number] ?Date of Birth: 07/27/1946 ?Referring Provider:   ?Flowsheet Row Cardiac Rehab from 11/15/2021 in [facility] Cardiac and Pulmonary Rehab  ?Referring Provider Kathlyn Sacramento MD  ? ?  ? ? ?Encounter Date: 12/24/2021 ? ?Check In: ? Session Check In - 12/24/21 0805   ? ?  ? Check-In  ? Supervising physician immediately available to respond to emergencies See telemetry face sheet for immediately available ER MD   ? Location [facility]-Cardiac & Pulmonary Rehab   ? Staff Present Heath Lark, RN, BSN, Laveda Norman, BS, ACSM CEP, Exercise Physiologist;Melissa [place], [place], LDN   ? Virtual Visit No   ? Medication changes reported     No   ? Fall or balance concerns reported    No   ? Warm-up and Cool-down Performed on first and last piece of equipment   ? Resistance Training Performed Yes   ? VAD Patient? No   ? PAD/SET Patient? No   ?  ? Pain Assessment  ? Currently in Pain? No/denies   ? ?  ?  ? ?  ? ? ? ? ? ?Social History  ? ?Tobacco Use  ?Smoking Status Former  ? Packs/day: 1.50  ? Years: 10.00  ? Pack years: 15.00  ? Types: Cigarettes  ? Quit date: 07/19/1990  ? Years since quitting: 31.4  ?Smokeless Tobacco Former  ? ? ?Goals Met:  ?Independence with exercise equipment ?Exercise tolerated well ?No report of concerns or symptoms today ? ?Goals Unmet:  ?Not Applicable ? ?Comments: Pt able to follow exercise prescription today without complaint.  Will continue to monitor for progression. ? ? ? ?Dr. Emily Filbert is Medical Director for [place].  ?Dr. Ottie Glazier is Medical Director for [facility] Pulmonary Rehabilitation. ?

## 2021-12-26 DIAGNOSIS — I252 Old myocardial infarction: Secondary | ICD-10-CM | POA: Diagnosis not present

## 2021-12-26 DIAGNOSIS — I214 Non-ST elevation (NSTEMI) myocardial infarction: Secondary | ICD-10-CM

## 2021-12-26 DIAGNOSIS — Z955 Presence of coronary angioplasty implant and graft: Secondary | ICD-10-CM | POA: Diagnosis not present

## 2021-12-26 NOTE — Progress Notes (Signed)
Daily Session Note ? ?Patient Details  ?Name: Alan Stewart ?MRN: [id number] ?Date of Birth: [birth year] ?Referring Provider:   ?Flowsheet Row Cardiac Rehab from 11/15/2021 in [facility] Cardiac and Pulmonary Rehab  ?Referring Provider Kathlyn Sacramento MD  ? ?  ? ? ?Encounter Date: 12/26/2021 ? ?Check In: ? Session Check In - 12/26/21 0720   ? ?  ? Check-In  ? Supervising physician immediately available to respond to emergencies See telemetry face sheet for immediately available ER MD   ? Location [facility]-Cardiac & Pulmonary Rehab   ? Staff Present Birdie Sons, MPA, RN;Melissa [place], RDN, LDN;Joseph [place], RCP,RRT,BSRT;Jessica [place], MA, RCEP, CCRP, CCET   ? Virtual Visit No   ? Medication changes reported     No   ? Fall or balance concerns reported    No   ? Warm-up and Cool-down Performed on first and last piece of equipment   ? Resistance Training Performed Yes   ? VAD Patient? No   ? PAD/SET Patient? No   ?  ? Pain Assessment  ? Currently in Pain? No/denies   ? ?  ?  ? ?  ? ? ? ? ? ?Social History  ? ?Tobacco Use  ?Smoking Status Former  ? Packs/day: 1.50  ? Years: 10.00  ? Pack years: 15.00  ? Types: Cigarettes  ? Quit date: 07/19/1990  ? Years since quitting: 31.4  ?Smokeless Tobacco Former  ? ? ?Goals Met:  ?Independence with exercise equipment ?Exercise tolerated well ?No report of concerns or symptoms today ?Strength training completed today ? ?Goals Unmet:  ?Not Applicable ? ?Comments: Pt able to follow exercise prescription today without complaint.  Will continue to monitor for progression. ? ? ? ?Dr. Emily Filbert is Medical Director for [place].  ?Dr. Ottie Glazier is Medical Director for [facility] Pulmonary Rehabilitation. ?

## 2021-12-28 ENCOUNTER — Encounter: Payer: Medicare HMO | Admitting: *Deleted

## 2021-12-28 DIAGNOSIS — I252 Old myocardial infarction: Secondary | ICD-10-CM | POA: Diagnosis not present

## 2021-12-28 DIAGNOSIS — I214 Non-ST elevation (NSTEMI) myocardial infarction: Secondary | ICD-10-CM

## 2021-12-28 DIAGNOSIS — Z955 Presence of coronary angioplasty implant and graft: Secondary | ICD-10-CM | POA: Diagnosis not present

## 2021-12-28 NOTE — Progress Notes (Signed)
Daily Session Note ? ?Patient Details  ?Name: Alan Stewart ?MRN: [id number] ?Date of Birth: 1946/06/24 ?Referring Provider:   ?Flowsheet Row Cardiac Rehab from 11/15/2021 in [facility] Cardiac and Pulmonary Rehab  ?Referring Provider Kathlyn Sacramento MD  ? ?  ? ? ?Encounter Date: 12/28/2021 ? ?Check In: ? Session Check In - 12/28/21 0806   ? ?  ? Check-In  ? Supervising physician immediately available to respond to emergencies See telemetry face sheet for immediately available ER MD   ? Location [facility]-Cardiac & Pulmonary Rehab   ? Staff Present Heath Lark, RN, BSN, CCRP;Laureen Owens Shark, BS, RRT, CPFT;Joseph [place], [place]   ? Virtual Visit No   ? Medication changes reported     No   ? Fall or balance concerns reported    No   ? Warm-up and Cool-down Performed on first and last piece of equipment   ? Resistance Training Performed Yes   ? VAD Patient? No   ? PAD/SET Patient? No   ?  ? Pain Assessment  ? Currently in Pain? No/denies   ? ?  ?  ? ?  ? ? ? ? ? ?Social History  ? ?Tobacco Use  ?Smoking Status Former  ? Packs/day: 1.50  ? Years: 10.00  ? Pack years: 15.00  ? Types: Cigarettes  ? Quit date: 07/19/1990  ? Years since quitting: 31.4  ?Smokeless Tobacco Former  ? ? ?Goals Met:  ?Independence with exercise equipment ?Exercise tolerated well ?No report of concerns or symptoms today ? ?Goals Unmet:  ?Not Applicable ? ?Comments: Pt able to follow exercise prescription today without complaint.  Will continue to monitor for progression. ? ? ? ?Dr. Emily Filbert is Medical Director for [place].  ?Dr. Ottie Glazier is Medical Director for [facility] Pulmonary Rehabilitation. ?

## 2021-12-31 ENCOUNTER — Encounter: Payer: Medicare HMO | Admitting: *Deleted

## 2021-12-31 DIAGNOSIS — Z955 Presence of coronary angioplasty implant and graft: Secondary | ICD-10-CM | POA: Diagnosis not present

## 2021-12-31 DIAGNOSIS — I252 Old myocardial infarction: Secondary | ICD-10-CM | POA: Diagnosis not present

## 2021-12-31 DIAGNOSIS — I214 Non-ST elevation (NSTEMI) myocardial infarction: Secondary | ICD-10-CM

## 2021-12-31 NOTE — Progress Notes (Signed)
Daily Session Note ? ?Patient Details  ?Name: Alan Stewart ?MRN: [id number] ?Date of Birth: [birth year] ?Referring Provider:   ?Flowsheet Row Cardiac Rehab from 11/15/2021 in [facility] Cardiac and Pulmonary Rehab  ?Referring Provider Kathlyn Sacramento MD  ? ?  ? ? ?Encounter Date: 12/31/2021 ? ?Check In: ? Session Check In - 12/31/21 0913   ? ?  ? Check-In  ? Supervising physician immediately available to respond to emergencies See telemetry face sheet for immediately available ER MD   ? Location [facility]-Cardiac & Pulmonary Rehab   ? Staff Present Heath Lark, RN, BSN, Laveda Norman, BS, ACSM CEP, Exercise Physiologist;Joseph [place], [place]   ? Virtual Visit No   ? Medication changes reported     No   ? Fall or balance concerns reported    No   ? Warm-up and Cool-down Performed on first and last piece of equipment   ? Resistance Training Performed Yes   ? VAD Patient? No   ? PAD/SET Patient? No   ?  ? Pain Assessment  ? Currently in Pain? No/denies   ? ?  ?  ? ?  ? ? ? ? ? ?Social History  ? ?Tobacco Use  ?Smoking Status Former  ? Packs/day: 1.50  ? Years: 10.00  ? Pack years: 15.00  ? Types: Cigarettes  ? Quit date: 07/19/1990  ? Years since quitting: 31.4  ?Smokeless Tobacco Former  ? ? ?Goals Met:  ?Independence with exercise equipment ?Exercise tolerated well ?No report of concerns or symptoms today ? ?Goals Unmet:  ?Not Applicable ? ?Comments: Pt able to follow exercise prescription today without complaint.  Will continue to monitor for progression. ? ? ? ?Dr. Emily Filbert is Medical Director for [place].  ?Dr. Ottie Glazier is Medical Director for [facility] Pulmonary Rehabilitation. ?

## 2022-01-02 ENCOUNTER — Encounter: Payer: Self-pay | Admitting: *Deleted

## 2022-01-02 DIAGNOSIS — I252 Old myocardial infarction: Secondary | ICD-10-CM | POA: Diagnosis not present

## 2022-01-02 DIAGNOSIS — I214 Non-ST elevation (NSTEMI) myocardial infarction: Secondary | ICD-10-CM

## 2022-01-02 DIAGNOSIS — Z955 Presence of coronary angioplasty implant and graft: Secondary | ICD-10-CM | POA: Diagnosis not present

## 2022-01-02 NOTE — Progress Notes (Signed)
Daily Session Note ? ?Patient Details  ?Name: Durant Scibilia Moffitt ?MRN: [id number] ?Date of Birth: September 28, 1945 ?Referring Provider:   ?Flowsheet Row Cardiac Rehab from 11/15/2021 in [facility] Cardiac and Pulmonary Rehab  ?Referring Provider Kathlyn Sacramento MD  ? ?  ? ? ?Encounter Date: 01/02/2022 ? ?Check In: ? Session Check In - 01/02/22 0717   ? ?  ? Check-In  ? Supervising physician immediately available to respond to emergencies See telemetry face sheet for immediately available ER MD   ? Location [facility]-Cardiac & Pulmonary Rehab   ? Staff Present Birdie Sons, MPA, RN;Melissa [place], RDN, LDN;Joseph [place], RCP,RRT,BSRT   ? Virtual Visit No   ? Medication changes reported     No   ? Fall or balance concerns reported    No   ? Warm-up and Cool-down Performed on first and last piece of equipment   ? Resistance Training Performed Yes   ? VAD Patient? No   ? PAD/SET Patient? No   ?  ? Pain Assessment  ? Currently in Pain? No/denies   ? ?  ?  ? ?  ? ? ? ? ? ?Social History  ? ?Tobacco Use  ?Smoking Status Former  ? Packs/day: 1.50  ? Years: 10.00  ? Pack years: 15.00  ? Types: Cigarettes  ? Quit date: 07/19/1990  ? Years since quitting: 31.4  ?Smokeless Tobacco Former  ? ? ?Goals Met:  ?Independence with exercise equipment ?Exercise tolerated well ?No report of concerns or symptoms today ?Strength training completed today ? ?Goals Unmet:  ?Not Applicable ? ?Comments: Pt able to follow exercise prescription today without complaint.  Will continue to monitor for progression. ? ? ? ?Dr. Emily Filbert is Medical Director for [place].  ?Dr. Ottie Glazier is Medical Director for [facility] Pulmonary Rehabilitation. ?

## 2022-01-02 NOTE — Progress Notes (Signed)
Cardiac Individual Treatment Plan ? ?Patient Details  ?Name: Alan Stewart ?MRN: [id number] ?Date of Birth: [birth year] ?Referring Provider:   ?Flowsheet Row Cardiac Rehab from 11/15/2021 in [facility] Cardiac and Pulmonary Rehab  ?Referring Provider Kathlyn Sacramento MD  ? ?  ? ? ?Initial Encounter Date:  ?Flowsheet Row Cardiac Rehab from 11/15/2021 in [facility] Cardiac and Pulmonary Rehab  ?Date 11/15/21  ? ?  ? ? ?Visit Diagnosis: NSTEMI (non-ST elevation myocardial infarction) ([place]) ? ?Patient's Home Medications on Admission: ? ?Current Outpatient Medications:  ?  aspirin EC 81 MG EC tablet, Take 1 tablet (81 mg total) by mouth daily. Swallow whole., Disp: 30 tablet, Rfl: 11 ?  atorvastatin (LIPITOR) 80 MG tablet, Take 1 tablet (80 mg total) by mouth daily., Disp: 90 tablet, Rfl: 3 ?  carvedilol (COREG) 6.25 MG tablet, Take 1 tablet (6.25 mg total) by mouth 2 (two) times daily with a meal., Disp: 60 tablet, Rfl: 3 ?  ezetimibe (ZETIA) 10 MG tablet, Take 1 tablet (10 mg total) by mouth daily., Disp: 90 tablet, Rfl: 3 ?  levothyroxine (SYNTHROID) 75 MCG tablet, Take 75 mcg by mouth daily before breakfast., Disp: , Rfl:  ?  nitroGLYCERIN (NITROSTAT) 0.4 MG SL tablet, Place 1 tablet (0.4 mg total) under the tongue every 5 (five) minutes as needed for chest pain., Disp: 30 tablet, Rfl: 12 ?  ticagrelor (BRILINTA) 90 MG TABS tablet, Take 1 tablet (90 mg total) by mouth 2 (two) times daily., Disp: 60 tablet, Rfl: 5 ? ?Past Medical History: ?Past Medical History:  ?Diagnosis Date  ? CAD (coronary artery disease)   ? a. 10/2021 NSTEMI/PCI: LM nl, LAD min irregs, LCX 40m(2.5x18 Onyx Frontier DES), OM1 nl, RCA 964m3.5x26 Onyx Frontier DES), 3024P ? Diastolic dysfunction   ? a. 10/2021 Echo: EF 50-55%, no rwma, Gr1 DD, nl RV fxn. No significant valvular dzs.  ? Hyperglycemia   ? Hyperlipidemia   ? Hypertension   ? Morbid obesity ([place]  ? Osteoarthritis   ? Sleep apnea   ? ? ?Tobacco Use: ?Social History  ? ?Tobacco Use  ?Smoking Status  Former  ? Packs/day: 1.50  ? Years: 10.00  ? Pack years: 15.00  ? Types: Cigarettes  ? Quit date: 07/19/1990  ? Years since quitting: 31.4  ?Smokeless Tobacco Former  ? ? ?Labs: ?Review Flowsheet   ? ?  ?  Latest Ref Rng & Units 10/20/2021 12/18/2021  ?Labs for ITP Cardiac and Pulmonary Rehab  ?Cholestrol 0 - 200 mg/dL 180   133    ?LDL (calc) 0 - 99 mg/dL 112   77    ?HDL-C >40 mg/dL 34   33    ?Trlycerides <150 mg/dL 171   114    ?Hemoglobin A1c 4.8 - 5.6 % 6.1     ?  ?  ?  ? ? ? ?Exercise Target Goals: ?Exercise Program Goal: ?Individual exercise prescription set using results from initial 6 min walk test and THRR while considering  patient?s activity barriers and safety.  ? ?Exercise Prescription Goal: ?Initial exercise prescription builds to 30-45 minutes a day of aerobic activity, 2-3 days per week.  Home exercise guidelines will be given to patient during program as part of exercise prescription that the participant will acknowledge. ? ? ?Education: Aerobic Exercise: ?- Group verbal and visual presentation on the components of exercise prescription. Introduces F.I.T.T principle from ACSM for exercise prescriptions.  Reviews F.I.T.T. principles of aerobic exercise including progression. Written material given at  graduation. ?Flowsheet Row Cardiac Rehab from 01/02/2022 in [facility] Cardiac and Pulmonary Rehab  ?Education need identified 11/15/21  ?Date 12/19/21  ?Educator [facility]  ?Instruction Review Code 1- Verbalizes Understanding  ? ?  ? ? ?Education: Resistance Exercise: ?- Group verbal and visual presentation on the components of exercise prescription. Introduces F.I.T.T principle from ACSM for exercise prescriptions  Reviews F.I.T.T. principles of resistance exercise including progression. Written material given at graduation. ?Flowsheet Row Cardiac Rehab from 01/02/2022 in [facility] Cardiac and Pulmonary Rehab  ?Date 12/26/21  ?Educator [facility]  ?Instruction Review Code 1- Verbalizes Understanding  ? ?  ? ?  ?Education: Exercise &  Equipment Safety: ?- Individual verbal instruction and demonstration of equipment use and safety with use of the equipment. ?Flowsheet Row Cardiac Rehab from 01/02/2022 in [facility] Cardiac and Pulmonary Rehab  ?Date 11/08/21  ?Educator [facility]  ?Instruction Review Code 1- Verbalizes Understanding  ? ?  ? ? ?Education: Exercise Physiology & General Exercise Guidelines: ?- Group verbal and written instruction with models to review the exercise physiology of the cardiovascular system and associated critical values. Provides general exercise guidelines with specific guidelines to those with heart or lung disease.  ?Flowsheet Row Cardiac Rehab from 01/02/2022 in [facility] Cardiac and Pulmonary Rehab  ?Date 12/12/21  ?Educator [facility]  ?Instruction Review Code 1- Verbalizes Understanding  ? ?  ? ? ?Education: Flexibility, Balance, Mind/Body Relaxation: ?- Group verbal and visual presentation with interactive activity on the components of exercise prescription. Introduces F.I.T.T principle from ACSM for exercise prescriptions. Reviews F.I.T.T. principles of flexibility and balance exercise training including progression. Also discusses the mind body connection.  Reviews various relaxation techniques to help reduce and manage stress (i.e. Deep breathing, progressive muscle relaxation, and visualization). Balance handout provided to take home. Written material given at graduation. ?Flowsheet Row Cardiac Rehab from 01/02/2022 in [facility] Cardiac and Pulmonary Rehab  ?Date 01/02/22  ?Educator [facility]  ?Instruction Review Code 1- Verbalizes Understanding  ? ?  ? ? ?Activity Barriers & Risk Stratification: ? Activity Barriers & Cardiac Risk Stratification - 11/15/21 1045   ? ?  ? Activity Barriers & Cardiac Risk Stratification  ? Activity Barriers Deconditioning;Muscular Weakness;Shortness of Breath;Balance Concerns;Arthritis   ? Cardiac Risk Stratification Moderate   ? ?  ?  ? ?  ? ? ?6 Minute Walk: ? 6 Minute Walk   ? ? [place] Name 11/15/21 1044  ?  ?  ?  ? 6  Minute Walk  ? Phase Initial    ? Distance 1078 feet    ? Walk Time 6 minutes    ? # of Rest Breaks 0    ? MPH 2.04    ? METS 1.39    ? RPE 13    ? Perceived Dyspnea  1    ? VO2 Peak 4.88    ? Symptoms Yes (comment)    ? Comments slightly SOB    ? Resting HR 50 bpm    ? Resting BP 132/72    ? Resting Oxygen Saturation  97 %    ? Exercise Oxygen Saturation  during 6 min walk 97 %    ? Max Ex. HR 79 bpm    ? Max Ex. BP 144/76    ? 2 Minute Post BP 124/60    ? ?  ?  ? ?  ? ? ?Oxygen Initial Assessment: ? ? ?Oxygen Re-Evaluation: ? ? ?Oxygen Discharge (Final Oxygen Re-Evaluation): ? ? ?Initial Exercise Prescription: ? Initial Exercise Prescription - 11/15/21 1000   ? ?  ?  Date of Initial Exercise RX and Referring Provider  ? Date 11/15/21   ? Referring Provider Kathlyn Sacramento MD   ?  ? Oxygen  ? Maintain Oxygen Saturation 88% or higher   ?  ? Treadmill  ? MPH 1.8   ? Grade 0.5   ? Minutes 15   ? METs 2.5   ?  ? Arm Ergometer  ? Level 1   ? Watts 25   ? RPM 25   ? Minutes 15   ? METs 1.5   ?  ? REL-XR  ? Level 1   ? Speed 50   ? Minutes 15   ? METs 2   ?  ? Track  ? Laps 28   ? Minutes 15   ? METs 2.52   ?  ? Prescription Details  ? Frequency (times per week) 3   ? Duration Progress to 30 minutes of continuous aerobic without signs/symptoms of physical distress   ?  ? Intensity  ? THRR 40-80% of Max Heartrate 89-128   ? Ratings of Perceived Exertion 11-13   ? Perceived Dyspnea 0-4   ?  ? Progression  ? Progression Continue to progress workloads to maintain intensity without signs/symptoms of physical distress.   ?  ? Resistance Training  ? Training Prescription Yes   ? Weight 4 lb   ? Reps 10-15   ? ?  ?  ? ?  ? ? ?Perform Capillary Blood Glucose checks as needed. ? ?Exercise Prescription Changes: ? ? Exercise Prescription Changes   ? ? [place] Name 11/15/21 1000 12/03/21 1100 12/21/21 0800  ?  ?  ?  ? Response to Exercise  ? Blood Pressure (Admit) 132/72 124/70 --    ? Blood Pressure (Exercise) 144/76 152/82 --    ? Blood  Pressure (Exit) 124/60 122/78 --    ? Heart Rate (Admit) 50 bpm 66 bpm --    ? Heart Rate (Exercise) 79 bpm 104 bpm --    ? Heart Rate (Exit) 56 bpm 89 bpm --    ? Oxygen Saturation (Admit) 97 % -- --

## 2022-01-04 ENCOUNTER — Encounter: Payer: Medicare HMO | Admitting: *Deleted

## 2022-01-04 DIAGNOSIS — I252 Old myocardial infarction: Secondary | ICD-10-CM | POA: Diagnosis not present

## 2022-01-04 DIAGNOSIS — I214 Non-ST elevation (NSTEMI) myocardial infarction: Secondary | ICD-10-CM

## 2022-01-04 DIAGNOSIS — Z955 Presence of coronary angioplasty implant and graft: Secondary | ICD-10-CM | POA: Diagnosis not present

## 2022-01-04 NOTE — Progress Notes (Signed)
Daily Session Note  Patient Details  Name: Alan Stewart MRN: [id number] Date of Birth: [birth year] Referring Provider:   Flowsheet Row Cardiac Rehab from 11/15/2021 in [facility] Cardiac and Pulmonary Rehab  Referring Provider Kathlyn Sacramento MD       Encounter Date: 01/04/2022  Check In:  Session Check In - 01/04/22 0814       Check-In   Supervising physician immediately available to respond to emergencies See telemetry face sheet for immediately available ER MD    Location [facility]-Cardiac & Pulmonary Rehab    Staff Present Heath Lark, RN, BSN, CCRP;Joseph [place], RCP,RRT,BSRT;Jessica Currie, [place], [place], CCRP, CCET    Virtual Visit No    Medication changes reported     No    Fall or balance concerns reported    No    Warm-up and Cool-down Performed on first and last piece of equipment    Resistance Training Performed Yes    VAD Patient? No    PAD/SET Patient? No      Pain Assessment   Currently in Pain? No/denies                Social History   Tobacco Use  Smoking Status Former   Packs/day: 1.50   Years: 10.00   Pack years: 15.00   Types: Cigarettes   Quit date: 07/19/1990   Years since quitting: 31.4  Smokeless Tobacco Former    Goals Met:  Independence with exercise equipment Exercise tolerated well No report of concerns or symptoms today  Goals Unmet:  Not Applicable  Comments: Pt able to follow exercise prescription today without complaint.  Will continue to monitor for progression.    Dr. Emily Filbert is Medical Director for [place].  Dr. Ottie Glazier is Medical Director for [facility] Pulmonary Rehabilitation.

## 2022-01-07 ENCOUNTER — Encounter: Payer: Medicare HMO | Admitting: *Deleted

## 2022-01-07 VITALS — Ht 67.6 in | Wt 271.3 lb

## 2022-01-07 DIAGNOSIS — Z955 Presence of coronary angioplasty implant and graft: Secondary | ICD-10-CM | POA: Diagnosis not present

## 2022-01-07 DIAGNOSIS — I252 Old myocardial infarction: Secondary | ICD-10-CM | POA: Diagnosis not present

## 2022-01-07 DIAGNOSIS — I214 Non-ST elevation (NSTEMI) myocardial infarction: Secondary | ICD-10-CM

## 2022-01-07 NOTE — Progress Notes (Signed)
Daily Session Note  Patient Details  Name: Alan Stewart MRN: [id number] Date of Birth: [birth year] Referring Provider:   Flowsheet Row Cardiac Rehab from 11/15/2021 in [facility] Cardiac and Pulmonary Rehab  Referring Provider Kathlyn Sacramento MD       Encounter Date: 01/07/2022  Check In:  Session Check In - 01/07/22 6301       Check-In   Supervising physician immediately available to respond to emergencies See telemetry face sheet for immediately available ER MD    Location [facility]-Cardiac & Pulmonary Rehab    Staff Present Heath Lark, RN, BSN, CCRP;Joseph [place], RCP,RRT,BSRT;Kelly [place], [place], ACSM CEP, Exercise Physiologist    Virtual Visit No    Medication changes reported     No    Fall or balance concerns reported    No    Warm-up and Cool-down Performed on first and last piece of equipment    Resistance Training Performed Yes    VAD Patient? No    PAD/SET Patient? No      Pain Assessment   Currently in Pain? No/denies                Social History   Tobacco Use  Smoking Status Former   Packs/day: 1.50   Years: 10.00   Pack years: 15.00   Types: Cigarettes   Quit date: 07/19/1990   Years since quitting: 31.4  Smokeless Tobacco Former    Goals Met:  Independence with exercise equipment Exercise tolerated well No report of concerns or symptoms today  Goals Unmet:  Not Applicable  Comments: Pt able to follow exercise prescription today without complaint.  Will continue to monitor for progression.   [place] Name 11/15/21 1044 01/07/22 0942       6 Minute Walk   Phase Initial Discharge    Distance 1078 feet 1490 feet    Distance % Change -- 38.2 %    Distance Feet Change -- 412 ft    Walk Time 6 minutes 6 minutes    # of Rest Breaks 0 0    MPH 2.04 2.82    METS 1.39 2.34    RPE 13 13    Perceived Dyspnea  1 --    VO2 Peak 4.88 8.18    Symptoms Yes (comment) No    Comments slightly SOB --    Resting HR 50 bpm 49 bpm    Resting BP  132/72 134/70    Resting Oxygen Saturation  97 % 96 %    Exercise Oxygen Saturation  during 6 min walk 97 % 96 %    Max Ex. HR 79 bpm 102 bpm    Max Ex. BP 144/76 142/74    2 Minute Post BP 124/60 --              Dr. Emily Filbert is Medical Director for [place].  Dr. Ottie Glazier is Medical Director for [facility] Pulmonary Rehabilitation.

## 2022-01-09 DIAGNOSIS — I252 Old myocardial infarction: Secondary | ICD-10-CM | POA: Diagnosis not present

## 2022-01-09 DIAGNOSIS — Z955 Presence of coronary angioplasty implant and graft: Secondary | ICD-10-CM | POA: Diagnosis not present

## 2022-01-09 DIAGNOSIS — I214 Non-ST elevation (NSTEMI) myocardial infarction: Secondary | ICD-10-CM

## 2022-01-09 NOTE — Progress Notes (Signed)
Daily Session Note  Patient Details  Name: Alan Stewart MRN: [id number] Date of Birth: 1946-07-06 Referring Provider:   Flowsheet Row Cardiac Rehab from 11/15/2021 in [facility] Cardiac and Pulmonary Rehab  Referring Provider Kathlyn Sacramento MD       Encounter Date: 01/09/2022  Check In:  Session Check In - 01/09/22 0710       Check-In   Supervising physician immediately available to respond to emergencies See telemetry face sheet for immediately available ER MD    Location [facility]-Cardiac & Pulmonary Rehab    Staff Present Birdie Sons, MPA, RN;Jessica Luan [place], MA, RCEP, CCRP, CCET;Joseph [place], [place]    Virtual Visit No    Medication changes reported     No    Fall or balance concerns reported    No    Warm-up and Cool-down Performed on first and last piece of equipment    Resistance Training Performed Yes    VAD Patient? No    PAD/SET Patient? No      Pain Assessment   Currently in Pain? No/denies                Social History   Tobacco Use  Smoking Status Former   Packs/day: 1.50   Years: 10.00   Pack years: 15.00   Types: Cigarettes   Quit date: 07/19/1990   Years since quitting: 31.4  Smokeless Tobacco Former    Goals Met:  Independence with exercise equipment Exercise tolerated well No report of concerns or symptoms today Strength training completed today  Goals Unmet:  Not Applicable  Comments: Pt able to follow exercise prescription today without complaint.  Will continue to monitor for progression.    Dr. Emily Filbert is Medical Director for [place].  Dr. Ottie Glazier is Medical Director for [facility] Pulmonary Rehabilitation.

## 2022-01-10 NOTE — Patient Instructions (Signed)
Discharge Patient Instructions  Patient Details  Name: Alan Stewart MRN: [id number] Date of Birth: 12/01/1945 Referring Provider:  Adin Hector, MD   Number of Visits: 36  Reason for Discharge:  Patient reached a stable level of exercise. Patient independent in their exercise. Patient has met program and personal goals.  Smoking History:  Social History   Tobacco Use  Smoking Status Former   Packs/day: 1.50   Years: 10.00   Pack years: 15.00   Types: Cigarettes   Quit date: 07/19/1990   Years since quitting: 31.5  Smokeless Tobacco Former    Diagnosis:  NSTEMI (non-ST elevation myocardial infarction) [facility])  Initial Exercise Prescription:  Initial Exercise Prescription - 11/15/21 1000       Date of Initial Exercise RX and Referring Provider   Date 11/15/21    Referring Provider Kathlyn Sacramento MD      Oxygen   Maintain Oxygen Saturation 88% or higher      Treadmill   MPH 1.8    Grade 0.5    Minutes 15    METs 2.5      Arm Ergometer   Level 1    Watts 25    RPM 25    Minutes 15    METs 1.5      REL-XR   Level 1    Speed 50    Minutes 15    METs 2      Track   Laps 28    Minutes 15    METs 2.52      Prescription Details   Frequency (times per week) 3    Duration Progress to 30 minutes of continuous aerobic without signs/symptoms of physical distress      Intensity   THRR 40-80% of Max Heartrate 89-128    Ratings of Perceived Exertion 11-13    Perceived Dyspnea 0-4      Progression   Progression Continue to progress workloads to maintain intensity without signs/symptoms of physical distress.      Resistance Training   Training Prescription Yes    Weight 4 lb    Reps 10-15             Discharge Exercise Prescription (Final Exercise Prescription Changes):  Exercise Prescription Changes - 01/02/22 1400       Response to Exercise   Blood Pressure (Admit) 128/70    Blood Pressure (Exit) 122/64    Heart Rate (Admit) 51 bpm     Heart Rate (Exercise) 85 bpm    Heart Rate (Exit) 74 bpm    Oxygen Saturation (Admit) 96 %    Oxygen Saturation (Exercise) 94 %    Oxygen Saturation (Exit) 98 %    Rating of Perceived Exertion (Exercise) 14    Symptoms none    Duration Continue with 30 min of aerobic exercise without signs/symptoms of physical distress.    Intensity THRR unchanged      Progression   Progression Continue to progress workloads to maintain intensity without signs/symptoms of physical distress.    Average METs 3.78      Resistance Training   Training Prescription Yes    Weight 5 lb    Reps 10-15      Interval Training   Interval Training No      Treadmill   MPH 2.3    Grade 2    Minutes 15    METs 3.4      REL-XR   Level 5    Minutes  15    METs 4.9      Home Exercise Plan   Plans to continue exercise at Home (comment)   walking, staff vidoes   Frequency Add 2 additional days to program exercise sessions.    Initial Home Exercises Provided 12/21/21      Oxygen   Maintain Oxygen Saturation 88% or higher             Functional Capacity:  6 Minute Walk     Row Name 11/15/21 1044 01/07/22 0942       6 Minute Walk   Phase Initial Discharge    Distance 1078 feet 1490 feet    Distance % Change -- 38.2 %    Distance Feet Change -- 412 ft    Walk Time 6 minutes 6 minutes    # of Rest Breaks 0 0    MPH 2.04 2.82    METS 1.39 2.34    RPE 13 13    Perceived Dyspnea  1 --    VO2 Peak 4.88 8.18    Symptoms Yes (comment) No    Comments slightly SOB --    Resting HR 50 bpm 49 bpm    Resting BP 132/72 134/70    Resting Oxygen Saturation  97 % 96 %    Exercise Oxygen Saturation  during 6 min walk 97 % 96 %    Max Ex. HR 79 bpm 102 bpm    Max Ex. BP 144/76 142/74    2 Minute Post BP 124/60 --             Nutrition & Weight - Outcomes:  Pre Biometrics - 11/15/21 1049       Pre Biometrics   Height 5' 7.6" (1.717 m)    Weight 271 lb 3.2 oz (123 kg)    BMI (Calculated)  41.73    Single Leg Stand 2.2 seconds             Post Biometrics - 01/07/22 0943        Post  Biometrics   Height 5' 7.6" (1.717 m)    Weight 271 lb 4.8 oz (123.1 kg)    BMI (Calculated) 41.74    Single Leg Stand 3.2 seconds             Nutrition:  Nutrition Therapy & Goals - 11/26/21 0752       Nutrition Therapy   Diet Heart healthy, low Na    Drug/Food Interactions Statins/Certain Fruits    Protein (specify units) 70g   Used adjusted body weight   Fiber 30 grams    Whole Grain Foods 3 servings    Saturated Fats 16 max. grams    Fruits and Vegetables 8 servings/day    Sodium 2 grams      Personal Nutrition Goals   Nutrition Goal ST: practice label reading and MyPlate guidelines LT: Follow MyPlate guidlines, limit Na <2g/day, saturated fat <16g/day    Comments 76 y.o. M admitted to rehab for NSTEMI. PMHx HTN, HLD, hypothyroidism, CAD, former smoker. Relevant medications include lipitor, coreg, synthroid. PYP Score: 59. Vegetables & Fruits 6/12. Breads, Grains & Cereals 6/12. Red & Processed Meat 7/12. Poultry 2/2. Fish & Shellfish 1/4. Beans, Nuts & Seeds 1/4. Milk & Dairy Foods 4/6. Toppings, Oils, Seasonings & Salt 16/20. Sweets, Snacks & Restaurant Food 8/14. Beverages 8/10.  [name] has made a lot of changes already including more salads, including more vegetables (lima beans, corn, celery, carrots, tomatoes, mushrooms and vegetables  in salad),  limiting red meat, reducing salt (using less salt), trying to limit fast food, more [place] (deli meat), his wife uses olive oil, he does not add salt anymore, and using whole wheat bread. He still using ranch dressing with his salads - he reports probably using too much. He reports getting used to the changes made - he has been having a hard time adjusting to lower sodium. Discussed heart healthy eating.      Intervention Plan   Intervention Prescribe, educate and counsel regarding individualized specific dietary modifications  aiming towards targeted core components such as weight, hypertension, lipid management, diabetes, heart failure and other comorbidities.    Expected Outcomes Short Term Goal: Understand basic principles of dietary content, such as calories, fat, sodium, cholesterol and nutrients.;Short Term Goal: A plan has been developed with personal nutrition goals set during dietitian appointment.;Long Term Goal: Adherence to prescribed nutrition plan.              Goals reviewed with patient; copy given to patient.

## 2022-01-11 ENCOUNTER — Encounter: Payer: Medicare HMO | Admitting: *Deleted

## 2022-01-11 DIAGNOSIS — I214 Non-ST elevation (NSTEMI) myocardial infarction: Secondary | ICD-10-CM

## 2022-01-11 DIAGNOSIS — Z955 Presence of coronary angioplasty implant and graft: Secondary | ICD-10-CM | POA: Diagnosis not present

## 2022-01-11 DIAGNOSIS — I252 Old myocardial infarction: Secondary | ICD-10-CM | POA: Diagnosis not present

## 2022-01-11 NOTE — Progress Notes (Signed)
Daily Session Note  Patient Details  Name: Alan Stewart MRN: [id number] Date of Birth: 1946/05/31 Referring Provider:   Flowsheet Row Cardiac Rehab from 11/15/2021 in [facility] Cardiac and Pulmonary Rehab  Referring Provider Kathlyn Sacramento MD       Encounter Date: 01/11/2022  Check In:  Session Check In - 01/11/22 0806       Check-In   Supervising physician immediately available to respond to emergencies See telemetry face sheet for immediately available ER MD    Location [facility]-Cardiac & Pulmonary Rehab    Staff Present [name], MA, RCEP, CCRP, CCET;Joseph [place], [place];Heath Lark, RN, BSN, CCRP    Virtual Visit No    Medication changes reported     No    Fall or balance concerns reported    No    Warm-up and Cool-down Performed on first and last piece of equipment    Resistance Training Performed Yes    VAD Patient? No    PAD/SET Patient? No      Pain Assessment   Currently in Pain? No/denies                Social History   Tobacco Use  Smoking Status Former   Packs/day: 1.50   Years: 10.00   Pack years: 15.00   Types: Cigarettes   Quit date: 07/19/1990   Years since quitting: 31.5  Smokeless Tobacco Former    Goals Met:  Independence with exercise equipment Exercise tolerated well No report of concerns or symptoms today  Goals Unmet:  Not Applicable  Comments: Pt able to follow exercise prescription today without complaint.  Will continue to monitor for progression.    Dr. Emily Filbert is Medical Director for [place].  Dr. Ottie Glazier is Medical Director for [facility] Pulmonary Rehabilitation.

## 2022-01-16 DIAGNOSIS — I214 Non-ST elevation (NSTEMI) myocardial infarction: Secondary | ICD-10-CM

## 2022-01-16 DIAGNOSIS — I252 Old myocardial infarction: Secondary | ICD-10-CM | POA: Diagnosis not present

## 2022-01-16 DIAGNOSIS — Z955 Presence of coronary angioplasty implant and graft: Secondary | ICD-10-CM | POA: Diagnosis not present

## 2022-01-16 NOTE — Progress Notes (Signed)
Daily Session Note  Patient Details  Name: Alan Stewart MRN: [id number] Date of Birth: 24-Feb-1946 Referring Provider:   Flowsheet Row Cardiac Rehab from 11/15/2021 in [facility] Cardiac and Pulmonary Rehab  Referring Provider Kathlyn Sacramento MD       Encounter Date: 01/16/2022  Check In:  Session Check In - 01/16/22 0721       Check-In   Supervising physician immediately available to respond to emergencies See telemetry face sheet for immediately available ER MD    Location [facility]-Cardiac & Pulmonary Rehab    Staff Present Birdie Sons, MPA, RN;Joseph [place], RCP,RRT,BSRT;Jessica [place], MA, RCEP, CCRP, CCET    Virtual Visit No    Medication changes reported     No    Fall or balance concerns reported    No    Warm-up and Cool-down Performed on first and last piece of equipment    Resistance Training Performed Yes    VAD Patient? No    PAD/SET Patient? No      Pain Assessment   Currently in Pain? No/denies                Social History   Tobacco Use  Smoking Status Former   Packs/day: 1.50   Years: 10.00   Pack years: 15.00   Types: Cigarettes   Quit date: 07/19/1990   Years since quitting: 31.5  Smokeless Tobacco Former    Goals Met:  Independence with exercise equipment Exercise tolerated well No report of concerns or symptoms today Strength training completed today  Goals Unmet:  Not Applicable  Comments: Pt able to follow exercise prescription today without complaint.  Will continue to monitor for progression.    Dr. Emily Filbert is Medical Director for [place].  Dr. Ottie Glazier is Medical Director for [facility] Pulmonary Rehabilitation.

## 2022-01-18 ENCOUNTER — Encounter: Payer: Medicare HMO | Attending: Cardiovascular Disease | Admitting: *Deleted

## 2022-01-18 DIAGNOSIS — I214 Non-ST elevation (NSTEMI) myocardial infarction: Secondary | ICD-10-CM | POA: Insufficient documentation

## 2022-01-18 NOTE — Progress Notes (Signed)
Cardiac Individual Treatment Plan  Patient Details  Name: Alan Stewart MRN: [id number] Date of Birth: 12-30-1945 Referring Provider:   Flowsheet Row Cardiac Rehab from 11/15/2021 in [facility] Cardiac and Pulmonary Rehab  Referring Provider Kathlyn Sacramento MD       Initial Encounter Date:  Flowsheet Row Cardiac Rehab from 11/15/2021 in [facility] Cardiac and Pulmonary Rehab  Date 11/15/21       Visit Diagnosis: NSTEMI (non-ST elevation myocardial infarction) [facility])  Patient's Home Medications on Admission:  Current Outpatient Medications:    aspirin EC 81 MG EC tablet, Take 1 tablet (81 mg total) by mouth daily. Swallow whole., Disp: 30 tablet, Rfl: 11   atorvastatin (LIPITOR) 80 MG tablet, Take 1 tablet (80 mg total) by mouth daily., Disp: 90 tablet, Rfl: 3   carvedilol (COREG) 6.25 MG tablet, Take 1 tablet (6.25 mg total) by mouth 2 (two) times daily with a meal., Disp: 60 tablet, Rfl: 3   ezetimibe (ZETIA) 10 MG tablet, Take 1 tablet (10 mg total) by mouth daily., Disp: 90 tablet, Rfl: 3   levothyroxine (SYNTHROID) 75 MCG tablet, Take 75 mcg by mouth daily before breakfast., Disp: , Rfl:    nitroGLYCERIN (NITROSTAT) 0.4 MG SL tablet, Place 1 tablet (0.4 mg total) under the tongue every 5 (five) minutes as needed for chest pain., Disp: 30 tablet, Rfl: 12   ticagrelor (BRILINTA) 90 MG TABS tablet, Take 1 tablet (90 mg total) by mouth 2 (two) times daily., Disp: 60 tablet, Rfl: 5  Past Medical History: Past Medical History:  Diagnosis Date   CAD (coronary artery disease)    a. 10/2021 NSTEMI/PCI: LM nl, LAD min irregs, LCX 53m(2.5x18 Onyx Frontier DES), OM1 nl, RCA 981m3.5x26 Onyx Frontier DES), 30d.   Diastolic dysfunction    a. 10/2021 Echo: EF 50-55%, no rwma, Gr1 DD, nl RV fxn. No significant valvular dzs.   Hyperglycemia    Hyperlipidemia    Hypertension    Morbid obesity ([place]   Osteoarthritis    Sleep apnea     Tobacco Use: Social History   Tobacco Use  Smoking Status  Former   Packs/day: 1.50   Years: 10.00   Pack years: 15.00   Types: Cigarettes   Quit date: 07/19/1990   Years since quitting: 31.5  Smokeless Tobacco Former    Labs: ReInsurance account manager      Latest Ref Rng & Units 10/20/2021 12/18/2021  Labs for ITP Cardiac and Pulmonary Rehab  Cholestrol 0 - 200 mg/dL 180   133    LDL (calc) 0 - 99 mg/dL 112   77    HDL-C >40 mg/dL 34   33    Trlycerides <150 mg/dL 171   114    Hemoglobin A1c 4.8 - 5.6 % 6.1              Exercise Target Goals: Exercise Program Goal: Individual exercise prescription set using results from initial 6 min walk test and THRR while considering  patient's activity barriers and safety.   Exercise Prescription Goal: Initial exercise prescription builds to 30-45 minutes a day of aerobic activity, 2-3 days per week.  Home exercise guidelines will be given to patient during program as part of exercise prescription that the participant will acknowledge.   Education: Aerobic Exercise: - Group verbal and visual presentation on the components of exercise prescription. Introduces F.I.T.T principle from ACSM for exercise prescriptions.  Reviews F.I.T.T. principles of aerobic exercise including progression. Written material given at  graduation. Flowsheet Row Cardiac Rehab from 01/09/2022 in [facility] Cardiac and Pulmonary Rehab  Education need identified 11/15/21  Date 12/19/21  Educator [facility]  Instruction Review Code 1- Verbalizes Understanding       Education: Resistance Exercise: - Group verbal and visual presentation on the components of exercise prescription. Introduces F.I.T.T principle from ACSM for exercise prescriptions  Reviews F.I.T.T. principles of resistance exercise including progression. Written material given at graduation. Flowsheet Row Cardiac Rehab from 01/09/2022 in [facility] Cardiac and Pulmonary Rehab  Date 12/26/21  Educator [facility]  Instruction Review Code 1- Verbalizes Understanding        Education: Exercise &  Equipment Safety: - Individual verbal instruction and demonstration of equipment use and safety with use of the equipment. Flowsheet Row Cardiac Rehab from 01/09/2022 in [facility] Cardiac and Pulmonary Rehab  Date 11/08/21  Educator [facility]  Instruction Review Code 1- Verbalizes Understanding       Education: Exercise Physiology & General Exercise Guidelines: - Group verbal and written instruction with models to review the exercise physiology of the cardiovascular system and associated critical values. Provides general exercise guidelines with specific guidelines to those with heart or lung disease.  Flowsheet Row Cardiac Rehab from 01/09/2022 in [facility] Cardiac and Pulmonary Rehab  Date 12/12/21  Educator [facility]  Instruction Review Code 1- Verbalizes Understanding       Education: Flexibility, Balance, Mind/Body Relaxation: - Group verbal and visual presentation with interactive activity on the components of exercise prescription. Introduces F.I.T.T principle from ACSM for exercise prescriptions. Reviews F.I.T.T. principles of flexibility and balance exercise training including progression. Also discusses the mind body connection.  Reviews various relaxation techniques to help reduce and manage stress (i.e. Deep breathing, progressive muscle relaxation, and visualization). Balance handout provided to take home. Written material given at graduation. Flowsheet Row Cardiac Rehab from 01/09/2022 in [facility] Cardiac and Pulmonary Rehab  Date 01/02/22  Educator [facility]  Instruction Review Code 1- Verbalizes Understanding       Activity Barriers & Risk Stratification:  Activity Barriers & Cardiac Risk Stratification - 11/15/21 1045       Activity Barriers & Cardiac Risk Stratification   Activity Barriers Deconditioning;Muscular Weakness;Shortness of Breath;Balance Concerns;Arthritis    Cardiac Risk Stratification Moderate             6 Minute Walk:  6 Minute Walk     Row Name 11/15/21 1044 01/07/22 0942        6 Minute Walk   Phase Initial Discharge    Distance 1078 feet 1490 feet    Distance % Change -- 38.2 %    Distance Feet Change -- 412 ft    Walk Time 6 minutes 6 minutes    # of Rest Breaks 0 0    MPH 2.04 2.82    METS 1.39 2.34    RPE 13 13    Perceived Dyspnea  1 --    VO2 Peak 4.88 8.18    Symptoms Yes (comment) No    Comments slightly SOB --    Resting HR 50 bpm 49 bpm    Resting BP 132/72 134/70    Resting Oxygen Saturation  97 % 96 %    Exercise Oxygen Saturation  during 6 min walk 97 % 96 %    Max Ex. HR 79 bpm 102 bpm    Max Ex. BP 144/76 142/74    2 Minute Post BP 124/60 --             Oxygen Initial  Assessment:   Oxygen Re-Evaluation:   Oxygen Discharge (Final Oxygen Re-Evaluation):   Initial Exercise Prescription:  Initial Exercise Prescription - 11/15/21 1000       Date of Initial Exercise RX and Referring Provider   Date 11/15/21    Referring Provider Kathlyn Sacramento MD      Oxygen   Maintain Oxygen Saturation 88% or higher      Treadmill   MPH 1.8    Grade 0.5    Minutes 15    METs 2.5      Arm Ergometer   Level 1    Watts 25    RPM 25    Minutes 15    METs 1.5      REL-XR   Level 1    Speed 50    Minutes 15    METs 2      Track   Laps 28    Minutes 15    METs 2.52      Prescription Details   Frequency (times per week) 3    Duration Progress to 30 minutes of continuous aerobic without signs/symptoms of physical distress      Intensity   THRR 40-80% of Max Heartrate 89-128    Ratings of Perceived Exertion 11-13    Perceived Dyspnea 0-4      Progression   Progression Continue to progress workloads to maintain intensity without signs/symptoms of physical distress.      Resistance Training   Training Prescription Yes    Weight 4 lb    Reps 10-15             Perform Capillary Blood Glucose checks as needed.  Exercise Prescription Changes:   Exercise Prescription Changes     Row Name 11/15/21 1000  12/03/21 1100 12/21/21 0800 01/02/22 1400 01/17/22 0800     Response to Exercise   Blood Pressure (Admit) 132/72 124/70 -- 128/70 126/64   Blood Pressure (Exercise) 144/76 152/82 -- -- --   Blood Pressure (Exit) 124/60 122/78 -- 122/64 102/60   Heart Rate (Admit) 50 bpm 66 bpm -- 51 bpm 61 bpm   Heart Rate (Exercise) 79 bpm 104 bpm -- 85 bpm 106 bpm   Heart Rate (Exit) 56 bpm 89 bpm -- 74 bpm 71 bpm   Oxygen Saturation (Admit) 97 % -- -- 96 % 96 %   Oxygen Saturation (Exercise) 97 % -- -- 94 % 95 %   Oxygen Saturation (Exit) -- -- -- 98 % 97 %   Rating of Perceived Exertion (Exercise) 13 14 -- 14 14   Perceived Dyspnea (Exercise) 1 -- -- -- --   Symptoms SOB none -- none none   Comments walk test results 2nd full week of exercise -- -- --   Duration -- Progress to 30 minutes of  aerobic without signs/symptoms of physical distress -- Continue with 30 min of aerobic exercise without signs/symptoms of physical distress. Continue with 30 min of aerobic exercise without signs/symptoms of physical distress.   Intensity -- THRR unchanged -- THRR unchanged THRR unchanged     Progression   Progression -- Continue to progress workloads to maintain intensity without signs/symptoms of physical distress. -- Continue to progress workloads to maintain intensity without signs/symptoms of physical distress. Continue to progress workloads to maintain intensity without signs/symptoms of physical distress.   Average METs -- 2.9 -- 3.78 3.82     Resistance Training   Training Prescription -- Yes -- Yes Yes   Weight -- 5  lb -- 5 lb 7 lb   Reps -- 10-15 -- 10-15 10-15     Interval Training   Interval Training -- No -- No No     Treadmill   MPH -- 2.2 -- 2.3 2.8   Grade -- 0.5 -- 2 4   Minutes -- 15 -- 15 15   METs -- 2.68 -- 3.4 4.29     Arm Ergometer   Level -- 1.9 -- -- --   Minutes -- 15 -- -- --   METs -- 2.06 -- -- --     REL-XR   Level -- 6 -- 5 6   Minutes -- 15 -- 15 15   METs -- 5.6 --  4.9 5.4     T5 Nustep   Level -- -- -- -- 6   Minutes -- -- -- -- 15   METs -- -- -- -- 3     Track   Laps -- 30 -- -- --   Minutes -- 15 -- -- --   METs -- 2.63 -- -- --     Home Exercise Plan   Plans to continue exercise at -- -- Home (comment)  walking, staff vidoes Home (comment)  walking, staff vidoes Home (comment)  walking, staff vidoes   Frequency -- -- Add 2 additional days to program exercise sessions. Add 2 additional days to program exercise sessions. Add 2 additional days to program exercise sessions.   Initial Home Exercises Provided -- -- 12/21/21 12/21/21 12/21/21     Oxygen   Maintain Oxygen Saturation -- 88% or higher -- 88% or higher 88% or higher            Exercise Comments:   Exercise Comments     Row Name 11/16/21 0805 01/18/22 0811         Exercise Comments First full day of exercise!  Patient was oriented to gym and equipment including functions, settings, policies, and procedures.  Patient's individual exercise prescription and treatment plan were reviewed.  All starting workloads were established based on the results of the 6 minute walk test done at initial orientation visit.  The plan for exercise progression was also introduced and progression will be customized based on patient's performance and goals. Alan Stewart graduated today from  rehab with 36 sessions completed.  Details of the patient's exercise prescription and what He needs to do in order to continue the prescription and progress were discussed with patient.  Patient was given a copy of prescription and goals.  Patient verbalized understanding.  Alan Stewart plans to continue to exercise by joining the [facility].  ([place] gym)               Exercise Goals and Review:   Exercise Goals     Row Name 11/15/21 1048             Exercise Goals   Increase Physical Activity Yes       Intervention Provide advice, education, support and counseling about physical activity/exercise  needs.;Develop an individualized exercise prescription for aerobic and resistive training based on initial evaluation findings, risk stratification, comorbidities and participant's personal goals.       Expected Outcomes Short Term: Attend rehab on a regular basis to increase amount of physical activity.;Long Term: Add in home exercise to make exercise part of routine and to increase amount of physical activity.;Long Term: Exercising regularly at least 3-5 days a week.       Increase Strength and Stamina Yes  Intervention Provide advice, education, support and counseling about physical activity/exercise needs.;Develop an individualized exercise prescription for aerobic and resistive training based on initial evaluation findings, risk stratification, comorbidities and participant's personal goals.       Expected Outcomes Short Term: Increase workloads from initial exercise prescription for resistance, speed, and METs.;Short Term: Perform resistance training exercises routinely during rehab and add in resistance training at home;Long Term: Improve cardiorespiratory fitness, muscular endurance and strength as measured by increased METs and functional capacity (6MWT)       Able to understand and use rate of perceived exertion (RPE) scale Yes       Intervention Provide education and explanation on how to use RPE scale       Expected Outcomes Short Term: Able to use RPE daily in rehab to express subjective intensity level;Long Term:  Able to use RPE to guide intensity level when exercising independently       Able to understand and use Dyspnea scale Yes       Intervention Provide education and explanation on how to use Dyspnea scale       Expected Outcomes Short Term: Able to use Dyspnea scale daily in rehab to express subjective sense of shortness of breath during exertion;Long Term: Able to use Dyspnea scale to guide intensity level when exercising independently       Knowledge and understanding of  Target Heart Rate Range (THRR) Yes       Intervention Provide education and explanation of THRR including how the numbers were predicted and where they are located for reference       Expected Outcomes Short Term: Able to state/look up THRR;Short Term: Able to use daily as guideline for intensity in rehab;Long Term: Able to use THRR to govern intensity when exercising independently       Able to check pulse independently Yes       Intervention Provide education and demonstration on how to check pulse in carotid and radial arteries.;Review the importance of being able to check your own pulse for safety during independent exercise       Expected Outcomes Short Term: Able to explain why pulse checking is important during independent exercise;Long Term: Able to check pulse independently and accurately       Understanding of Exercise Prescription Yes       Intervention Provide education, explanation, and written materials on patient's individual exercise prescription       Expected Outcomes Short Term: Able to explain program exercise prescription;Long Term: Able to explain home exercise prescription to exercise independently                Exercise Goals Re-Evaluation :  Exercise Goals Re-Evaluation     Row Name 11/16/21 0805 12/03/21 1117 12/14/21 0736 12/21/21 0830 01/02/22 1416     Exercise Goal Re-Evaluation   Exercise Goals Review Understanding of Exercise Prescription;Knowledge and understanding of Target Heart Rate Range (THRR);Able to understand and use rate of perceived exertion (RPE) scale;Increase Strength and Stamina;Able to understand and use Dyspnea scale Increase Physical Activity;Increase Strength and Stamina Increase Strength and Stamina;Increase Physical Activity Increase Strength and Stamina;Increase Physical Activity;Able to understand and use rate of perceived exertion (RPE) scale;Able to understand and use Dyspnea scale;Knowledge and understanding of Target Heart Rate Range  (THRR);Able to check pulse independently;Understanding of Exercise Prescription Increase Physical Activity;Increase Strength and Stamina;Understanding of Exercise Prescription   Comments Reviewed RPE and dyspnea scales, THR and program prescription with pt today.  Pt voiced understanding and  was given a copy of goals to take home. Alan Stewart is off to a good start for the first couple of sessions that he has been here. He has increased on most of his machines including XR increased to level 6, TM to 2.2/0.5, and laps went up to a total of 30. He has also increased to 5 lbs for handweights and has been hitting his [place] every session. We will continue to monitor for progression. Alan Stewart wants to exercise at home when he is done with the program. He wants to do walking at home and informed him that he could go to [facility] [employer] and have more drive to workout. [facility] says [facility] and might be a good alternative for him. Reviewed home exercise with pt today.  Pt plans to walk at home and use staff videos.  Also encouraged to use staff videos for exercise.  Reviewed THR, pulse, RPE, sign and symptoms, pulse oximetery and when to call 911 or MD.  Also discussed weather considerations and indoor options.  Pt voiced understanding. Alan Stewart is doing well in rehab.  He is up to level 5 on the XR and level 6 on the NuStep.  We will continue to montior his progress.   Expected Outcomes Short: Use RPE daily to regulate intensity. Long: Follow program prescription in THR. Short: Continue to increase workload on arm crank and track Long: Continue to increase overall MET level Short: join a gym. Long: maintain working out independently. Short: Walk more on off days Long; Conitnue to move more at home Short: Try 6 lb handweights Long; Conintue to improve stamina    Row Name 01/11/22 0732 01/17/22 0843           Exercise Goal Re-Evaluation   Exercise Goals Review Increase Strength and Stamina;Increase Physical Activity Increase  Strength and Stamina;Increase Physical Activity;Understanding of Exercise Prescription      Comments Alan Stewart has improved on all of his levels and plans to join the [place] to continue exercise. His wife is going to join him at the [place] so they can go Librarian, academic. Alan Stewart is doing well in rehab and is ready to graduate. He increased his average MET level to 3.82 METs. He also increased his speed on the treadmill to 2.8 mph with a 4% grade. Alan Stewart has also improved from 5 lb weights to 7 lb weights for resistance training. We will continue to monitor his progress until he graduates from the program.      Expected Outcomes ShortL join the [place]. Long: maintain Exercise Independently. Short: Graduate. Long: Continue to exercise independently.               Discharge Exercise Prescription (Final Exercise Prescription Changes):  Exercise Prescription Changes - 01/17/22 0800       Response to Exercise   Blood Pressure (Admit) 126/64    Blood Pressure (Exit) 102/60    Heart Rate (Admit) 61 bpm    Heart Rate (Exercise) 106 bpm    Heart Rate (Exit) 71 bpm    Oxygen Saturation (Admit) 96 %    Oxygen Saturation (Exercise) 95 %    Oxygen Saturation (Exit) 97 %    Rating of Perceived Exertion (Exercise) 14    Symptoms none    Duration Continue with 30 min of aerobic exercise without signs/symptoms of physical distress.    Intensity THRR unchanged      Progression   Progression Continue to progress workloads to maintain intensity without signs/symptoms of physical distress.  Average METs 3.82      Resistance Training   Training Prescription Yes    Weight 7 lb    Reps 10-15      Interval Training   Interval Training No      Treadmill   MPH 2.8    Grade 4    Minutes 15    METs 4.29      REL-XR   Level 6    Minutes 15    METs 5.4      T5 Nustep   Level 6    Minutes 15    METs 3      Home Exercise Plan   Plans to continue exercise at Home (comment)   walking, staff vidoes    Frequency Add 2 additional days to program exercise sessions.    Initial Home Exercises Provided 12/21/21      Oxygen   Maintain Oxygen Saturation 88% or higher             Nutrition:  Target Goals: Understanding of nutrition guidelines, daily intake of sodium <155m, cholesterol <2036m calories 30% from fat and 7% or less from saturated fats, daily to have 5 or more servings of fruits and vegetables.  Education: All About Nutrition: -Group instruction provided by verbal, written material, interactive activities, discussions, models, and posters to present general guidelines for heart healthy nutrition including fat, fiber, MyPlate, the role of sodium in heart healthy nutrition, utilization of the nutrition label, and utilization of this knowledge for meal planning. Follow up email sent as well. Written material given at graduation. Flow[facility]  Education need identified 11/15/21  Date 01/09/22  Educator [facility]ruction Review Code 1- Verbalizes Understanding       Biometrics:  Pre Biometrics - 11/15/21 1049       Pre Biometrics   Height 5' 7.6" (1.717 m)    Weight 271 lb 3.2 oz (123 kg)    BMI (Calculated) 41.73    Single Leg Stand 2.2 seconds             Post Biometrics - 01/07/22 0943        Post  Biometrics   Height 5' 7.6" (1.717 m)    Weight 271 lb 4.8 oz (123.1 kg)    BMI (Calculated) 41.74    Single Leg Stand 3.2 seconds             Nutrition Therapy Plan and Nutrition Goals:  Nutrition Therapy & Goals - 11/26/21 0752       Nutrition Therapy   Diet Heart healthy, low Na    Drug/Food Interactions Statins/Certain Fruits    Protein (specify units) 70g   Used adjusted body weight   Fiber 30 grams    Whole Grain Foods 3 servings    Saturated Fats 16 max. grams    Fruits and Vegetables 8 servings/day    Sodium 2 grams      Personal Nutrition Goals   Nutrition Goal ST: practice  label reading and MyPlate guidelines LT: Follow MyPlate guidlines, limit Na <2g/day, saturated fat <16g/day    Comments [age].o. M admitted to rehab for NSTEMI. PMHx HTN, HLD, hypothyroidism, CAD, former smoker. Relevant medications include lipitor, coreg, synthroid. PYP Score: 59. Vegetables & Fruits 6/12. Breads, Grains & Cereals 6/12. Red & Processed Meat 7/12. Poultry 2/2. Fish & Shellfish 1/4. Beans, Nuts & Seeds 1/4. Milk & Dairy Foods 4/6. Toppings, Oils, Seasonings & Salt  16/20. Sweets, Snacks & Restaurant Food 8/14. Beverages 8/10.  Alan Stewart has made a lot of changes already including more salads, including more vegetables (lima beans, corn, celery, carrots, tomatoes, mushrooms and vegetables in salad),  limiting red meat, reducing salt (using less salt), trying to limit fast food, more [place] (deli meat), his wife uses olive oil, he does not add salt anymore, and using whole wheat bread. He still using ranch dressing with his salads - he reports probably using too much. He reports getting used to the changes made - he has been having a hard time adjusting to lower sodium. Discussed heart healthy eating.      Intervention Plan   Intervention Prescribe, educate and counsel regarding individualized specific dietary modifications aiming towards targeted core components such as weight, hypertension, lipid management, diabetes, heart failure and other comorbidities.    Expected Outcomes Short Term Goal: Understand basic principles of dietary content, such as calories, fat, sodium, cholesterol and nutrients.;Short Term Goal: A plan has been developed with personal nutrition goals set during dietitian appointment.;Long Term Goal: Adherence to prescribed nutrition plan.             Nutrition Assessments:  MEDIFICTS Score Key: ?70 Need to make dietary changes  40-70 Heart Healthy Diet ? 40 Therapeutic Level Cholesterol Diet  Flowsheet Row Cardiac Rehab from 11/15/2021 in [facility] Cardiac and Pulmonary Rehab   Picture Your Plate Total Score on Admission 59      Picture Your Plate Scores: <76 Unhealthy dietary pattern with much room for improvement. 41-50 Dietary pattern unlikely to meet recommendations for good health and room for improvement. 51-60 More healthful dietary pattern, with some room for improvement.  >60 Healthy dietary pattern, although there may be some specific behaviors that could be improved.    Nutrition Goals Re-Evaluation:  Nutrition Goals Re-Evaluation     Alan Stewart Name 12/14/21 0750             Goals   Current Weight 276 lb (125.2 kg)       Nutrition Goal Eat smaller portions and lose weight.       Comment Alan Stewart states he may be intaking more sodium than he realizes. He wants to find snacks that dont have alot of sodium. Informed him that crackers and peanut butter may have to much sodium in it depending on brands and how much he eats of it.       Expected Outcome Short: lower sodium intake. Long: maintain lower sodium diet.                Nutrition Goals Discharge (Final Nutrition Goals Re-Evaluation):  Nutrition Goals Re-Evaluation - 12/14/21 0750       Goals   Current Weight 276 lb (125.2 kg)    Nutrition Goal Eat smaller portions and lose weight.    Comment Alan Stewart states he may be intaking more sodium than he realizes. He wants to find snacks that dont have alot of sodium. Informed him that crackers and peanut butter may have to much sodium in it depending on brands and how much he eats of it.    Expected Outcome Short: lower sodium intake. Long: maintain lower sodium diet.             Psychosocial: Target Goals: Acknowledge presence or absence of significant depression and/or stress, maximize coping skills, provide positive support system. Participant is able to verbalize types and ability to use techniques and skills needed for reducing stress and depression.   Education: Stress,  Anxiety, and Depression - Group verbal and visual presentation to  define topics covered.  Reviews how body is impacted by stress, anxiety, and depression.  Also discusses healthy ways to reduce stress and to treat/manage anxiety and depression.  Written material given at graduation. Flowsheet Row Cardiac Rehab from 01/09/2022 in [facility] Cardiac and Pulmonary Rehab  Date 12/05/21  Educator [facility]  Instruction Review Code 1- [employer] Understanding       Education: Sleep Hygiene -Provides group verbal and written instruction about how sleep can affect your health.  Define sleep hygiene, discuss sleep cycles and impact of sleep habits. Review good sleep hygiene tips.    Initial Review & Psychosocial Screening:  Initial Psych Review & Screening - 11/08/21 0910       Initial Review   Current issues with None Identified      Family Dynamics   Good Support System? Yes    Comments Alan Stewart can look to his wife, children and friends for support. He has a positive outlook on his health.      Barriers   Psychosocial barriers to participate in program The patient should benefit from training in stress management and relaxation.;There are no identifiable barriers or psychosocial needs.      Screening Interventions   Interventions Encouraged to exercise;To provide support and resources with identified psychosocial needs;Provide feedback about the scores to participant    Expected Outcomes Short Term goal: Utilizing psychosocial counselor, staff and physician to assist with identification of specific Stressors or current issues interfering with healing process. Setting desired goal for each stressor or current issue identified.;Long Term Goal: Stressors or current issues are controlled or eliminated.;Short Term goal: Identification and review with participant of any Quality of Life or Depression concerns found by scoring the questionnaire.;Long Term goal: The participant improves quality of Life and PHQ9 Scores as seen by post scores and/or verbalization of changes              Quality of Life Scores:   Quality of Life - 11/15/21 1050       Quality of Life   Select Quality of Life      Quality of Life Scores   Health/Function Pre 21.33 %    Socioeconomic Pre 23.63 %    Psych/Spiritual Pre 30 %    Family Pre 27.6 %    GLOBAL Pre 24.49 %            Scores of 19 and below usually indicate a poorer quality of life in these areas.  A difference of  2-3 points is a clinically meaningful difference.  A difference of 2-3 points in the total score of the Quality of Life Index has been associated with significant improvement in overall quality of life, self-image, physical symptoms, and general health in studies assessing change in quality of life.  PHQ-9: Review Flowsheet        11/15/2021  Depression screen PHQ 2/9  Decreased Interest 0  Down, Depressed, Hopeless 0  PHQ - 2 Score 0  Altered sleeping 1  Tired, decreased energy 1  Change in appetite 0  Feeling bad or failure about yourself  0  Trouble concentrating 0  Moving slowly or fidgety/restless 0  Suicidal thoughts 0  PHQ-9 Score 2  Difficult doing work/chores Not difficult at all         Interpretation of Total Score  Total Score Depression Severity:  1-4 = Minimal depression, 5-9 = Mild depression, 10-14 = Moderate depression, 15-19 = Moderately  severe depression, 20-27 = Severe depression   Psychosocial Evaluation and Intervention:  Psychosocial Evaluation - 11/08/21 0911       Psychosocial Evaluation & Interventions   Interventions Encouraged to exercise with the program and follow exercise prescription;Relaxation education;Stress management education    Comments Alan Stewart can look to his wife, children and friends for support. He has a positive outlook on his health.    Expected Outcomes Short: Start [facility] to help with mood. Long: Maintain a healthy mental state    Continue Psychosocial Services  Follow up required by staff             Psychosocial Re-Evaluation:   Psychosocial Re-Evaluation     Row Name 12/14/21 [phone] 01/11/22 0734           Psychosocial Re-Evaluation   Current issues with None Identified --      Comments Patient reports no issues with their current mental states, sleep, stress, depression or anxiety. Will follow up with patient in a few weeks for any changes. Alan Stewart states he loves life and is has no depression anxiety or sleep concerns. He has done well in the program and has always had a positive attitude.      Expected Outcomes Short: Continue to exercise regularly to support mental health and notify staff of any changes. Long: maintain mental health and well being through teaching of rehab or prescribed medications independently. --      Interventions Encouraged to attend Cardiac Rehabilitation for the exercise --      Continue Psychosocial Services  Follow up required by staff --               Psychosocial Discharge (Final Psychosocial Re-Evaluation):  Psychosocial Re-Evaluation - 01/11/22 0734       Psychosocial Re-Evaluation   Comments Alan Stewart states he loves life and is has no depression anxiety or sleep concerns. He has done well in the program and has always had a positive attitude.             Vocational Rehabilitation: Provide vocational rehab assistance to qualifying candidates.   Vocational Rehab Evaluation & Intervention:   Education: Education Goals: Education classes will be provided on a variety of topics geared toward better understanding of heart health and risk factor modification. Participant will state understanding/return demonstration of topics presented as noted by education test scores.  Learning Barriers/Preferences:  Learning Barriers/Preferences - 11/08/21 7672       Learning Barriers/Preferences   Learning Barriers None    Learning Preferences None             General Cardiac Education Topics:  AED/CPR: - Group verbal and written instruction with the use of models to  demonstrate the basic use of the AED with the basic ABC's of resuscitation.   Anatomy and Cardiac Procedures: - Group verbal and visual presentation and models provide information about basic cardiac anatomy and function. Reviews the testing methods done to diagnose heart disease and the outcomes of the test results. Describes the treatment choices: Medical Management, Angioplasty, or Coronary Bypass Surgery for treating various heart conditions including Myocardial Infarction, Angina, Valve Disease, and Cardiac Arrhythmias.  Written material given at graduation. Flowsheet Row Cardiac Rehab from 01/09/2022 in [facility] Cardiac and Pulmonary Rehab  Education need identified 11/15/21  Date 12/26/21  Educator SB  Instruction Review Code 1- Verbalizes Understanding       Medication Safety: - Group verbal and visual instruction to review commonly prescribed medications for heart and lung disease.  Reviews the medication, class of the drug, and side effects. Includes the steps to properly store meds and maintain the prescription regimen.  Written material given at graduation.   Intimacy: - Group verbal instruction through game format to discuss how heart and lung disease can affect sexual intimacy. Written material given at graduation.. Flowsheet Row Cardiac Rehab from 01/09/2022 in [facility] Cardiac and Pulmonary Rehab  Date 12/19/21  Educator [facility]  Instruction Review Code 1- Verbalizes Understanding       Know Your Numbers and Heart Failure: - Group verbal and visual instruction to discuss disease risk factors for cardiac and pulmonary disease and treatment options.  Reviews associated critical values for Overweight/Obesity, Hypertension, Cholesterol, and Diabetes.  Discusses basics of heart failure: signs/symptoms and treatments.  Introduces Heart Failure Zone chart for action plan for heart failure.  Written material given at graduation. Flowsheet Row Cardiac Rehab from 01/09/2022 in [facility] Cardiac and  Pulmonary Rehab  Education need identified 11/15/21  Date 11/21/21  Educator SB  Instruction Review Code 1- Verbalizes Understanding       Infection Prevention: - Provides verbal and written material to individual with discussion of infection control including proper hand washing and proper equipment cleaning during exercise session. Flowsheet Row Cardiac Rehab from 01/09/2022 in [facility] Cardiac and Pulmonary Rehab  Date 11/08/21  Educator [facility]  Instruction Review Code 1- Verbalizes Understanding       Falls Prevention: - Provides verbal and written material to individual with discussion of falls prevention and safety. Flowsheet Row Cardiac Rehab from 01/09/2022 in [facility] Cardiac and Pulmonary Rehab  Date 11/08/21  Educator [facility]  Instruction Review Code 1- Verbalizes Understanding       Other: -Provides group and verbal instruction on various topics (see comments)   Knowledge Questionnaire Score:  Knowledge Questionnaire Score - 11/15/21 1050       Knowledge Questionnaire Score   Pre Score 21/26             Core Components/Risk Factors/Patient Goals at Admission:  Personal Goals and Risk Factors at Admission - 11/15/21 1050       Core Components/Risk Factors/Patient Goals on Admission    Weight Management Yes;Weight Loss;Obesity    Intervention Weight Management: Develop a combined nutrition and exercise program designed to reach desired caloric intake, while maintaining appropriate intake of nutrient and fiber, sodium and fats, and appropriate energy expenditure required for the weight goal.;Weight Management: Provide education and appropriate resources to help participant work on and attain dietary goals.;Weight Management/Obesity: Establish reasonable short term and long term weight goals.;Obesity: Provide education and appropriate resources to help participant work on and attain dietary goals.    Admit Weight 271 lb 3.2 oz (123 kg)    Goal Weight: Short Term 266 lb  (120.7 kg)    Goal Weight: Long Term 260 lb (117.9 kg)    Expected Outcomes Long Term: Adherence to nutrition and physical activity/exercise program aimed toward attainment of established weight goal;Short Term: Continue to assess and modify interventions until short term weight is achieved;Understanding recommendations for meals to include 15-35% energy as protein, 25-35% energy from fat, 35-60% energy from carbohydrates, less than 236m of dietary cholesterol, 20-35 gm of total fiber daily;Weight Loss: Understanding of general recommendations for a balanced deficit meal plan, which promotes 1-2 lb weight loss per week and includes a negative energy balance of [phone] kcal/d;Understanding of distribution of calorie intake throughout the day with the consumption of 4-5 meals/snacks    Heart Failure Yes  Intervention Provide a combined exercise and nutrition program that is supplemented with education, support and counseling about heart failure. Directed toward relieving symptoms such as shortness of breath, decreased exercise tolerance, and extremity edema.    Expected Outcomes Improve functional capacity of life;Short term: Attendance in program 2-3 days a week with increased exercise capacity. Reported lower sodium intake. Reported increased fruit and vegetable intake. Reports medication compliance.;Short term: Daily weights obtained and reported for increase. Utilizing diuretic protocols set by physician.;Long term: Adoption of self-care skills and reduction of barriers for early signs and symptoms recognition and intervention leading to self-care maintenance.    Hypertension Yes    Intervention Provide education on lifestyle modifcations including regular physical activity/exercise, weight management, moderate sodium restriction and increased consumption of fresh fruit, vegetables, and low fat dairy, alcohol moderation, and smoking cessation.;Monitor prescription use compliance.    Expected Outcomes  Short Term: Continued assessment and intervention until BP is < 140/5m HG in hypertensive participants. < 130/820mHG in hypertensive participants with diabetes, heart failure or chronic kidney disease.;Long Term: Maintenance of blood pressure at goal levels.    Lipids Yes    Intervention Provide education and support for participant on nutrition & aerobic/resistive exercise along with prescribed medications to achieve LDL <7028mHDL >38m6m  Expected Outcomes Short Term: Participant states understanding of desired cholesterol values and is compliant with medications prescribed. Participant is following exercise prescription and nutrition guidelines.;Long Term: Cholesterol controlled with medications as prescribed, with individualized exercise RX and with personalized nutrition plan. Value goals: LDL < 70mg20mL > 40 mg.             Education:Diabetes - Individual verbal and written instruction to review signs/symptoms of diabetes, desired ranges of glucose level fasting, after meals and with exercise. Acknowledge that pre and post exercise glucose checks will be done for 3 sessions at entry of program.   Core Components/Risk Factors/Patient Goals Review:   Goals and Risk Factor Review     Row Name 12/14/21 0740 01/11/22 0735           Core Components/Risk Factors/Patient Goals Review   Personal Goals Review Weight Management/Obesity Weight Management/Obesity;Other      Review Sams weight has gone up since the start of the program. He knows that he may be intaking to much sodium and to much food. We talked about diet and trying to look at more labels. He wants to lose weight and reach his weight goal. He is going to start looking more at nutrition labels and watching his sodium intake. Alan Stewart is graduatiing next friday. He states he has enjoyed the program and will continue his exercises. He does not have any further questions about he program or his medications. He will continue to try to  lose weight.      Expected Outcomes Short: lose 5 pounds in the next few weeks. Long: reach weight loss goal. Short: lose more weight. Long: reach weight goal independently.               Core Components/Risk Factors/Patient Goals at Discharge (Final Review):   Goals and Risk Factor Review - 01/11/22 0735       Core Components/Risk Factors/Patient Goals Review   Personal Goals Review Weight Management/Obesity;Other    Review Alan Stewart is graduatiing next friday. He states he has enjoyed the program and will continue his exercises. He does not have any further questions about he program or his medications. He will continue to try to lose  weight.    Expected Outcomes Short: lose more weight. Long: reach weight goal independently.             ITP Comments:  ITP Comments     Row Name 11/08/21 0909 11/15/21 1044 11/16/21 0804 11/26/21 0751 12/05/21 1256   ITP Comments Virtual Visit completed. Patient informed on EP and RD appointment and 6 Minute walk test. Patient also informed of patient health questionnaires on My Chart. Patient Verbalizes understanding. Visit diagnosis can be found in [facility] 10/20/2021. Completed 6MWT and gym orientation. Initial ITP created and sent for review to Dr. Emily Filbert, Medical Director. First full day of exercise!  Patient was oriented to gym and equipment including functions, settings, policies, and procedures.  Patient's individual exercise prescription and treatment plan were reviewed.  All starting workloads were established based on the results of the 6 minute walk test done at initial orientation visit.  The plan for exercise progression was also introduced and progression will be customized based on patient's performance and goals. Completed initial RD consultation 30 Day review completed. Medical Director ITP review done, changes made as directed, and signed approval by Medical Director.    [place] Name 01/02/22 1030 01/18/22 0811         ITP Comments 30 Day  review completed. Medical Director ITP review done, changes made as directed, and signed approval by Medical Director. Siddh graduated today from  rehab with 36 sessions completed.  Details of the patient's exercise prescription and what He needs to do in order to continue the prescription and progress were discussed with patient.  Patient was given a copy of prescription and goals.  Patient verbalized understanding.  Raphel plans to continue to exercise by joining the [facility].  ([place] gym)               Comments: Discharge ITP

## 2022-01-18 NOTE — Progress Notes (Signed)
Discharge Note  Christia graduated today from  rehab with 36 sessions completed.  Details of the patient's exercise prescription and what He needs to do in order to continue the prescription and progress were discussed with patient.  Patient was given a copy of prescription and goals.  Patient verbalized understanding.  Jamiere plans to continue to exercise by joining the [facility].  ([place])  [place] Name 11/15/21 1044 01/07/22 0942       6 Minute Walk   Phase Initial Discharge    Distance 1078 feet 1490 feet    Distance % Change -- 38.2 %    Distance Feet Change -- 412 ft    Walk Time 6 minutes 6 minutes    # of Rest Breaks 0 0    MPH 2.04 2.82    METS 1.39 2.34    RPE 13 13    Perceived Dyspnea  1 --    VO2 Peak 4.88 8.18    Symptoms Yes (comment) No    Comments slightly SOB --    Resting HR 50 bpm 49 bpm    Resting BP 132/72 134/70    Resting Oxygen Saturation  97 % 96 %    Exercise Oxygen Saturation  during 6 min walk 97 % 96 %    Max Ex. HR 79 bpm 102 bpm    Max Ex. BP 144/76 142/74    2 Minute Post BP 124/60 --             Thank you for the referral. We enjoyed working with [name].

## 2022-01-18 NOTE — Progress Notes (Signed)
Daily Session Note  Patient Details  Name: Alan Stewart MRN: [id number] Date of Birth: February 07, 1946 Referring Provider:   Flowsheet Row Cardiac Rehab from 11/15/2021 in [facility] Cardiac and Pulmonary Rehab  Referring Provider Alan Sacramento MD       Encounter Date: 01/18/2022  Check In:  Session Check In - 01/18/22 0810       Check-In   Supervising physician immediately available to respond to emergencies See telemetry face sheet for immediately available ER MD    Location [facility]-Cardiac & Pulmonary Rehab    Staff Present Alberteen [name], MA, RCEP, CCRP, CCET;Joseph Chaparral, [place];Heath Lark, RN, BSN, CCRP    Virtual Visit No    Medication changes reported     No    Fall or balance concerns reported    No    Warm-up and Cool-down Performed on first and last piece of equipment    Resistance Training Performed Yes    VAD Patient? No    PAD/SET Patient? No      Pain Assessment   Currently in Pain? No/denies                Social History   Tobacco Use  Smoking Status Former   Packs/day: 1.50   Years: 10.00   Pack years: 15.00   Types: Cigarettes   Quit date: 07/19/1990   Years since quitting: 31.5  Smokeless Tobacco Former    Goals Met:  Independence with exercise equipment Exercise tolerated well Personal goals reviewed No report of concerns or symptoms today  Goals Unmet:  Not Applicable  Comments:  Alan Stewart graduated today from  rehab with 36 sessions completed.  Details of the patient's exercise prescription and what He needs to do in order to continue the prescription and progress were discussed with patient.  Patient was given a copy of prescription and goals.  Patient verbalized understanding.  [name] plans to continue to exercise by joining the [facility].  [name]    Dr. Emily Filbert is Medical Director for [place].  Dr. Ottie Glazier is Medical Director for [facility] Pulmonary Rehabilitation.

## 2022-02-04 DIAGNOSIS — R972 Elevated prostate specific antigen [PSA]: Secondary | ICD-10-CM | POA: Diagnosis not present

## 2022-02-04 DIAGNOSIS — Z6841 Body Mass Index (BMI) 40.0 and over, adult: Secondary | ICD-10-CM | POA: Diagnosis not present

## 2022-02-04 DIAGNOSIS — I10 Essential (primary) hypertension: Secondary | ICD-10-CM | POA: Diagnosis not present

## 2022-02-04 DIAGNOSIS — G4730 Sleep apnea, unspecified: Secondary | ICD-10-CM | POA: Diagnosis not present

## 2022-02-04 DIAGNOSIS — M1990 Unspecified osteoarthritis, unspecified site: Secondary | ICD-10-CM | POA: Diagnosis not present

## 2022-02-04 DIAGNOSIS — E785 Hyperlipidemia, unspecified: Secondary | ICD-10-CM | POA: Diagnosis not present

## 2022-02-04 DIAGNOSIS — E039 Hypothyroidism, unspecified: Secondary | ICD-10-CM | POA: Diagnosis not present

## 2022-02-04 DIAGNOSIS — I2510 Atherosclerotic heart disease of native coronary artery without angina pectoris: Secondary | ICD-10-CM | POA: Diagnosis not present

## 2022-02-04 DIAGNOSIS — R7303 Prediabetes: Secondary | ICD-10-CM | POA: Diagnosis not present

## 2022-03-21 ENCOUNTER — Ambulatory Visit: Payer: Medicare HMO | Admitting: Cardiovascular Disease

## 2022-03-21 ENCOUNTER — Encounter: Payer: Self-pay | Admitting: Cardiovascular Disease

## 2022-03-21 ENCOUNTER — Telehealth: Payer: Self-pay | Admitting: *Deleted

## 2022-03-21 VITALS — BP 136/68 | HR 65 | Ht 67.0 in | Wt 269.5 lb

## 2022-03-21 DIAGNOSIS — I2510 Atherosclerotic heart disease of native coronary artery without angina pectoris: Secondary | ICD-10-CM

## 2022-03-21 DIAGNOSIS — E782 Mixed hyperlipidemia: Secondary | ICD-10-CM | POA: Diagnosis not present

## 2022-03-21 DIAGNOSIS — R0683 Snoring: Secondary | ICD-10-CM | POA: Diagnosis not present

## 2022-03-21 MED ORDER — CARVEDILOL 6.25 MG PO TABS: 6.2500 mg | ORAL_TABLET | Freq: Two times a day (BID) | ORAL | 1 refills | Status: DC

## 2022-03-21 MED ORDER — TICAGRELOR 90 MG PO TABS: 90.0000 mg | ORAL_TABLET | Freq: Two times a day (BID) | ORAL | 1 refills | Status: DC

## 2022-03-21 NOTE — Patient Instructions (Signed)
Medication Instructions:  Your physician recommends that you continue on your current medications as directed. Please refer to the Current Medication list given to you today.  *If you need a refill on your cardiac medications before your next appointment, please call your pharmacy*   Lab Work: None ordered If you have labs (blood work) drawn today and your tests are completely normal, you will receive your results only by: [place] (if you have MyChart) OR A paper copy in the mail If you have any lab test that is abnormal or we need to change your treatment, we will call you to review the results.   Testing/Procedures: Your physician has recommended that you have a at home sleep study.   You have been given the [facility] device to take home. Please do not open the box until you are called with your activation code.  You have been given the Instruction sheet today.  Follow-Up: At [facility], you and your health needs are our priority.  As part of our continuing mission to provide you with exceptional heart care, we have created designated Provider Care Teams.  These Care Teams include your primary Cardiologist (physician) and Advanced Practice Providers (APPs -  Physician Assistants and Nurse Practitioners) who all work together to provide you with the care you need, when you need it.  We recommend signing up for the patient portal called "MyChart".  Sign up information is provided on this After Visit Summary.  MyChart is used to connect with patients for Virtual Visits (Telemedicine).  Patients are able to view lab/test results, encounter notes, upcoming appointments, etc.  Non-urgent messages can be sent to your provider as well.   To learn more about what you can do with MyChart, go to [email or web address].    Your next appointment:   Your physician wants you to follow-up in: 6 months You will receive a reminder letter in the mail two months in advance. If you don't receive a  letter, please call our office to schedule the follow-up appointment.   The format for your next appointment:   In Person  Provider:   You may see Kathlyn Sacramento, MD or one of the following Advanced Practice Providers on your designated Care Team:   Murray Hodgkins, NP Christell Faith, PA-C Cadence Kathlen Mody, PA-C{     Other Instructions N/A  Important Information About Sugar

## 2022-03-21 NOTE — Progress Notes (Signed)
Cardiology Office Note   Date:  03/21/2022   ID:  Alan Stewart, DOB 1946/06/11, MRN [id number]  PCP:  Adin Hector, MD  Cardiologist:   Kathlyn Sacramento, MD   Chief Complaint  Patient presents with   Other    3 month f/u no complaints today. Meds reviewed verbally with pt.      History of Present Illness: Alan Stewart is a 76 y.o. male who presents for a follow-up visit regarding coronary artery disease. He has known history of essential hypertension, hyperlipidemia, prediabetes, obesity and obstructive sleep apnea. He presented in March of this year with non-ST elevation myocardial infarction.  Cardiac catheterization showed severe mid left circumflex and mid RCA disease.  Both were treated with PCI and drug-eluting stent placement.  EF was 50 to 55%. He attended cardiac rehab.  He has been doing very well with no chest pain, shortness of breath or palpitations.  He has symptoms of sleep apnea but has not had a home sleep study done yet.  He is very hesitant about wearing a CPAP.  He takes his medications regularly.  He wonders about Brilinta given that it cost him $45 per month.  He gets the other medications for free.  Past Medical History:  Diagnosis Date   CAD (coronary artery disease)    a. 10/2021 NSTEMI/PCI: LM nl, LAD min irregs, LCX 70m(2.5x18 Onyx Frontier DES), OM1 nl, RCA 971m3.5x26 Onyx Frontier DES), 30d.   Diastolic dysfunction    a. 10/2021 Echo: EF 50-55%, no rwma, Gr1 DD, nl RV fxn. No significant valvular dzs.   Hyperglycemia    Hyperlipidemia    Hypertension    Morbid obesity ([place]   Osteoarthritis    Sleep apnea     Past Surgical History:  Procedure Laterality Date   COLONOSCOPY N/A 10/09/2015   Procedure: COLONOSCOPY;  Surgeon: PaHulen LusterMD;  [facility];  Service: Gastroenterology;  Laterality: N/A;   CORONARY STENT INTERVENTION N/A 10/22/2021   Procedure: CORONARY STENT INTERVENTION;  Surgeon: ArWellington HampshireMD;  [facility]  [facility];  Service: Cardiovascular;  Laterality: N/A;   LEFT HEART CATH AND CORONARY ANGIOGRAPHY N/A 10/22/2021   Procedure: LEFT HEART CATH AND CORONARY ANGIOGRAPHY;  Surgeon: ArWellington HampshireMD;  [facility];  Service: Cardiovascular;  Laterality: N/A;   ROTATOR CUFF REPAIR       Current Outpatient Medications  Medication Sig Dispense Refill   aspirin EC 81 MG EC tablet Take 1 tablet (81 mg total) by mouth daily. Swallow whole. 30 tablet 11   atorvastatin (LIPITOR) 80 MG tablet Take 1 tablet (80 mg total) by mouth daily. 90 tablet 3   ezetimibe (ZETIA) 10 MG tablet Take 1 tablet (10 mg total) by mouth daily. 90 tablet 3   levothyroxine (SYNTHROID) 75 MCG tablet Take 75 mcg by mouth daily before breakfast.     nitroGLYCERIN (NITROSTAT) 0.4 MG SL tablet Place 1 tablet (0.4 mg total) under the tongue every 5 (five) minutes as needed for chest pain. 30 tablet 12   carvedilol (COREG) 6.25 MG tablet Take 1 tablet (6.25 mg total) by mouth 2 (two) times daily with a meal. 180 tablet 1   ticagrelor (BRILINTA) 90 MG TABS tablet Take 1 tablet (90 mg total) by mouth 2 (two) times daily. 180 tablet 1   No current facility-administered medications for this visit.    Allergies:   Patient has no known  allergies.    Social History:  The patient  reports that he quit smoking about 31 years ago. His smoking use included cigarettes. He has a 15.00 pack-year smoking history. He has quit using smokeless tobacco. He reports current alcohol use. He reports that he does not use drugs.   Family History:  The patient's family history is not on file.    ROS:  Please see the history of present illness.   Otherwise, review of systems are positive for none.   All other systems are reviewed and negative.    PHYSICAL EXAM: VS:  BP 136/68 (BP Location: Left Arm, Patient Position: Sitting, Cuff Size: Normal)   Pulse 65   Ht '5\' 7"'$  (1.702 m)   Wt 269 lb 8 oz (122.2 kg)   SpO2 98%    BMI 42.21 kg/m  , BMI Body mass index is 42.21 kg/m. GEN: Well nourished, well developed, in no acute distress  HEENT: normal  Neck: no JVD, carotid bruits, or masses Cardiac: RRR; no murmurs, rubs, or gallops,no edema  Respiratory:  clear to auscultation bilaterally, normal work of breathing GI: soft, nontender, nondistended, + BS MS: no deformity or atrophy  Skin: warm and dry, no rash Neuro:  Strength and sensation are intact Psych: euthymic mood, full affect   EKG:  EKG is not ordered today.    Recent Labs: 10/20/2021: TSH 2.044 10/22/2021: BUN 17; Creatinine, Ser 1.09; Hemoglobin 14.7; Magnesium 2.1; Platelets 210; Potassium 4.0; Sodium 138 12/18/2021: ALT 23    Lipid Panel    Component Value Date/Time   CHOL 133 12/18/2021 1101   TRIG 114 12/18/2021 1101   HDL 33 (L) 12/18/2021 1101   CHOLHDL 4.0 12/18/2021 1101   VLDL 23 12/18/2021 1101   LDLCALC 77 12/18/2021 1101      Wt Readings from Last 3 Encounters:  03/21/22 269 lb 8 oz (122.2 kg)  01/07/22 271 lb 4.8 oz (123.1 kg)  12/18/21 269 lb 6 oz (122.2 kg)           No data to display            ASSESSMENT AND PLAN:  1.  Coronary artery disease involving native coronary arteries without angina: He is doing very well with no anginal symptoms.  He had concerns about the cost of Brilinta and he pays $45 per month.  I explained to him that this will be used until March 2024 and then will discontinue.  He agreed to stay on the same medications for now.  2.  Essential hypertension: Blood pressure is well controlled.  3.  Hyperlipidemia: Zetia was added during last visit.  Most recent lipid profile showed an LDL of 77.  4.  Obstructive sleep apnea: Stop bang score is 7.  He has not had a sleep study done yet.  I requested a home sleep study.  5.  Morbid obesity: I discussed the importance of healthy lifestyle.  I encouraged him to enroll at the extended cardiac rehab or [employer] sne[employer]    Disposition:   FU  with me in 6 months  Signed,  Kathlyn Sacramento, MD  03/21/2022 9:00 AM    [place]

## 2022-03-21 NOTE — Telephone Encounter (Signed)
Prior Authorization for ITAMAR sent to [employer] via Phone. Reference # .  NO PA REQ 

## 2022-03-22 NOTE — Telephone Encounter (Signed)
Called the pt to make him aware that his WatchPat ITAMAR does not require a PA and he may precede with the at home sleep test provided to him at his o/v. Patient will need to be provided the activation code [email or web address]. Lmtcb. Will also send a my chart message.

## 2022-03-28 ENCOUNTER — Encounter (INDEPENDENT_AMBULATORY_CARE_PROVIDER_SITE_OTHER): Payer: Medicare HMO | Admitting: Cardiology

## 2022-03-28 DIAGNOSIS — G4733 Obstructive sleep apnea (adult) (pediatric): Secondary | ICD-10-CM | POA: Diagnosis not present

## 2022-03-29 NOTE — Procedures (Signed)
   SLEEP STUDY REPORT Patient Information Study Date: 03/28/22 Patient Name: Alan Stewart Patient ID: [id number] Birth Date: 10-26-2045 Age: 76 Gender: Male BMI: 42.2 (W=269 lb, H=5' 7'') Stopbang: 7 Referring Physician: Ignacia Bayley, NP  TEST DESCRIPTION: Home sleep apnea testing was completed using the WatchPat, a Type 1 device, utilizing peripheral arterial tonometry (PAT), chest movement, actigraphy, pulse oximetry, pulse rate, body position and snore. AHI was calculated with apnea and hypopnea using valid sleep time as the denominator. RDI includes apneas, hypopneas, and RERAs. The data acquired and the scoring of sleep and all associated events were performed in accordance with the recommended standards and specifications as outlined in the AASM Manual for the Scoring of Sleep and Associated Events 2.2.0 (2015).  FINDINGS: 1. Severe Obstructive Sleep Apnea with AHI 34.5/hr. 2. No Central Sleep Apnea with pAHIc 2.8/hr. 3. Oxygen desaturations as low as 78%. 4. Mild snoring was present. O2 sats were < 88% for 97.2 min. 5. Total sleep time was 8 hrs and 12 min. 6. 27.3% of total sleep time was spent in REM sleep. 7. Shortened sleep onset latency at 6 min. 8. Shortened REM sleep onset latency at 63 min. 9. Total awakenings were 14.  DIAGNOSIS: Severe Obstructive Sleep Apnea (G47.33) Nocturnal Hypoxemia  RECOMMENDATIONS: 1. Clinical correlation of these findings is necessary. The decision to treat obstructive sleep apnea (OSA) is usually based on the presence of apnea symptoms or the presence of associated medical conditions such as Hypertension, Congestive Heart Failure, Atrial Fibrillation or Obesity. The most common symptoms of OSA are snoring, gasping for breath while sleeping, daytime sleepiness and fatigue.  2. Initiating apnea therapy is recommended given the presence of symptoms and/or associated conditions. Recommend proceeding with one of the following:   a.  Auto-CPAP therapy with a pressure range of 5-20cm H2O.   b. An oral appliance (OA) that can be obtained from certain dentists with expertise in sleep medicine. These are primarily of use in non-obese patients with mild and moderate disease.   c. An ENT consultation which may be useful to look for specific causes of obstruction and possible treatment options.   d. If patient is intolerant to PAP therapy, consider referral to ENT for evaluation for hypoglossal nerve stimulator.  3. Close follow-up is necessary to ensure success with CPAP or oral appliance therapy for maximum benefit .  4. A follow-up oximetry study on CPAP is recommended to assess the adequacy of therapy and determine the need for supplemental oxygen or the potential need for Bi-level therapy. An arterial blood gas to determine the adequacy of baseline ventilation and oxygenation should also be considered.  5. Healthy sleep recommendations include: adequate nightly sleep (normal 7-9 hrs/night), avoidance of caffeine after noon and alcohol near bedtime, and maintaining a sleep environment that is cool, dark and quiet.  6. Weight loss for overweight patients is recommended. Even modest amounts of weight loss can significantly improve the severity of sleep apnea.  7. Snoring recommendations include: weight loss where appropriate, side sleeping, and avoidance of alcohol before bed.  8. Operation of motor vehicle should be avoided when sleepy.  Signature: Fransico Him, MD; [facility]; [place], [place] Board of Sleep Medicine Electronically Signed: 03/29/22

## 2022-04-01 ENCOUNTER — Ambulatory Visit: Payer: Medicare HMO

## 2022-04-01 DIAGNOSIS — R0683 Snoring: Secondary | ICD-10-CM

## 2022-04-09 ENCOUNTER — Telehealth: Payer: Self-pay | Admitting: *Deleted

## 2022-04-09 NOTE — Telephone Encounter (Signed)
-----   Message from Lauralee Evener, [place] sent at 03/29/2022  4:28 PM EDT -----  ----- Message ----- From: Sueanne Margarita, MD Sent: 03/29/2022   4:19 PM EDT To: Cv Div Sleep Studies  Please let patient know that they have sleep apnea.  Recommend therapeutic CPAP titration for treatment of patient's sleep disordered breathing.  If unable to perform an in lab titration then initiate ResMed auto CPAP from 4 to 15cm H2O with heated humidity and mask of choice and overnight pulse ox on CPAP.

## 2022-04-09 NOTE — Telephone Encounter (Signed)
The patient has been notified of the result. Left detailed message on voicemail and informed patient to call back.Elieser Tetrick [place], CMA 12/27/2021 12:08 PM     

## 2022-04-30 NOTE — Telephone Encounter (Signed)
The patient has been notified of the result. Left detailed message on voicemail and informed patient to call back..Alan Stewart, CMA   

## 2022-05-08 DIAGNOSIS — R7303 Prediabetes: Secondary | ICD-10-CM | POA: Diagnosis not present

## 2022-05-08 DIAGNOSIS — E7849 Other hyperlipidemia: Secondary | ICD-10-CM | POA: Diagnosis not present

## 2022-05-08 DIAGNOSIS — G4733 Obstructive sleep apnea (adult) (pediatric): Secondary | ICD-10-CM | POA: Diagnosis not present

## 2022-05-08 DIAGNOSIS — I2510 Atherosclerotic heart disease of native coronary artery without angina pectoris: Secondary | ICD-10-CM | POA: Diagnosis not present

## 2022-05-08 DIAGNOSIS — E039 Hypothyroidism, unspecified: Secondary | ICD-10-CM | POA: Diagnosis not present

## 2022-05-14 DIAGNOSIS — E785 Hyperlipidemia, unspecified: Secondary | ICD-10-CM | POA: Diagnosis not present

## 2022-05-14 DIAGNOSIS — I10 Essential (primary) hypertension: Secondary | ICD-10-CM | POA: Diagnosis not present

## 2022-05-14 DIAGNOSIS — E1165 Type 2 diabetes mellitus with hyperglycemia: Secondary | ICD-10-CM | POA: Insufficient documentation

## 2022-05-14 DIAGNOSIS — I2510 Atherosclerotic heart disease of native coronary artery without angina pectoris: Secondary | ICD-10-CM | POA: Diagnosis not present

## 2022-05-14 DIAGNOSIS — R972 Elevated prostate specific antigen [PSA]: Secondary | ICD-10-CM | POA: Diagnosis not present

## 2022-05-14 DIAGNOSIS — E119 Type 2 diabetes mellitus without complications: Secondary | ICD-10-CM | POA: Diagnosis not present

## 2022-05-14 DIAGNOSIS — E039 Hypothyroidism, unspecified: Secondary | ICD-10-CM | POA: Diagnosis not present

## 2022-05-14 DIAGNOSIS — L989 Disorder of the skin and subcutaneous tissue, unspecified: Secondary | ICD-10-CM | POA: Diagnosis not present

## 2022-05-14 DIAGNOSIS — Z6841 Body Mass Index (BMI) 40.0 and over, adult: Secondary | ICD-10-CM | POA: Diagnosis not present

## 2022-05-16 ENCOUNTER — Ambulatory Visit: Payer: Medicare HMO | Admitting: Dermatology

## 2022-05-16 ENCOUNTER — Encounter: Payer: Self-pay | Admitting: Dermatology

## 2022-05-16 DIAGNOSIS — C44629 Squamous cell carcinoma of skin of left upper limb, including shoulder: Secondary | ICD-10-CM | POA: Diagnosis not present

## 2022-05-16 DIAGNOSIS — C4492 Squamous cell carcinoma of skin, unspecified: Secondary | ICD-10-CM

## 2022-05-16 DIAGNOSIS — D492 Neoplasm of unspecified behavior of bone, soft tissue, and skin: Secondary | ICD-10-CM

## 2022-05-16 HISTORY — DX: Squamous cell carcinoma of skin, unspecified: C44.92

## 2022-05-16 NOTE — Patient Instructions (Addendum)
Ice 10 minutes out of the hour while awake for the next 24 hours if you need to help with discomfort  Rest your hand on the bandage to provide gentle pressure to the wound as often as possible for the rest of the day  Page Dr. Laurence Ferrari [phone]  if you have any concerns. Dial the pager number, then dial your phone number. If you have not heard back within the hour, please page again. If you are concerned about an emergency, go seek emergency medical care.   Electrodesiccation and Curettage ("Scrape and Burn") Wound Care Instructions  Leave the original bandage on for 24 hours if possible.  If the bandage becomes soaked or soiled before that time, it is OK to remove it and examine the wound.  A small amount of post-operative bleeding is normal.  If excessive bleeding occurs, remove the bandage, place gauze over the site and apply continuous pressure (no peeking) over the area for 30 minutes. If this does not work, please call our clinic as soon as possible or page your doctor if it is after hours. You can also sprinkle BleedStop powder on the wound before applying pressure to help stop any bleeding.  2. The DuoDerm dressing we sent home with you can stay on the wound for 2-3 days including in the shower. If it becomes dirty or fills up with fluid, change it early.   3. When you change the dressing, cleanse the wound with soap and water. It is fine to shower. If a thick crust develops you may use a Q-tip dipped into dilute hydrogen peroxide (mix 1:1 with water) to dissolve it.  Hydrogen peroxide can slow the healing process, so use it only as needed.    4. For best healing, the wound should be covered with the Duoderm or with a layer of vaseline ointment and regular bandage at all times. If you are not able to keep the area covered with a bandage to hold the ointment in place, this may mean re-applying vaseline ointment several times a day.  Continue this wound care until the wound has healed and is no  longer open. It may take several weeks for the wound to heal and close.  Itching and mild discomfort is normal during the healing process.  If you have any discomfort, you can take Tylenol (acetaminophen) or ibuprofen as directed on the bottle. (Please do not take these if you have an allergy to them or cannot take them for another reason).  Some redness, tenderness and white or yellow material in the wound is normal healing.  If the area becomes very sore and red, or develops a thick yellow-green material (pus), it may be infected; please notify us.    Wound healing continues for up to one year following surgery. It is not unusual to experience pain in the scar from time to time during the interval.  If the pain becomes severe or the scar thickens, you should notify the office.    A slight amount of redness in a scar is expected for the first six months.  After six months, the redness will fade and the scar will soften and fade.  The color difference becomes less noticeable with time.  If there are any problems, return for a post-op surgery check at your earliest convenience.  To improve the appearance of the scar, you can use silicone scar gel, cream, or sheets (such as Mederma or Serica) every night for up to one year. These are  available over the counter (without a prescription).  Please call our office at [phone] for any questions or concerns.   Recommend using Bleed Stop powder. It is available at [employer].    Recommend taking Heliocare sun protection supplement daily in sunny weather for additional sun protection. For maximum protection on the sunniest days, you can take up to 2 capsules of regular Heliocare OR take 1 capsule of Heliocare Ultra. For prolonged exposure (such as a full day in the sun), you can repeat your dose of the supplement 4 hours after your first dose. Heliocare can be purchased at [employer], at some [facility] or at [email or web address].      Recommend daily broad spectrum sunscreen SPF 30+ to sun-exposed areas, reapply every 2 hours as needed. Call for new or changing lesions.  Staying in the shade or wearing long sleeves, sun glasses (UVA+UVB protection) and wide brim hats (4-inch brim around the entire circumference of the hat) are also recommended for sun protection.    Due to recent changes in healthcare laws, you may see results of your pathology and/or laboratory studies on MyChart before the doctors have had a chance to review them. We understand that in some cases there may be results that are confusing or concerning to you. Please understand that not all results are received at the same time and often the doctors may need to interpret multiple results in order to provide you with the best plan of care or course of treatment. Therefore, we ask that you please give [place] 2 business days to thoroughly review all your results before contacting the office for clarification. Should we see a critical lab result, you will be contacted sooner.   If You Need Anything After Your Visit  If you have any questions or concerns for your doctor, please call our main line at [phone] and press option 4 to reach your doctor's medical assistant. If no one answers, please leave a voicemail as directed and we will return your call as soon as possible. Messages left after 4 pm will be answered the following business day.   You may also send [place] a message via [place]. We typically respond to MyChart messages within 1-2 business days.  For prescription refills, please ask your pharmacy to contact our office. Our fax number is [phone].  If you have an urgent issue when the clinic is closed that cannot wait until the next business day, you can page your doctor at the number below.    Please note that while we do our best to be available for urgent issues outside of office hours, we are not available 24/7.   If you have an urgent issue and  are unable to reach [place], you may choose to seek medical care at your doctor's office, retail clinic, urgent care center, or emergency room.  If you have a medical emergency, please immediately call 911 or go to the emergency department.  Pager Numbers  - Dr. Nehemiah Massed: [phone]  - Dr. Laurence Ferrari: [phone]  - Dr. Nicole Kindred: [phone]  In the event of inclement weather, please call our main line at [phone] for an update on the status of any delays or closures.  Dermatology Medication Tips: Please keep the boxes that topical medications come in in order to help keep track of the instructions about where and how to use these. Pharmacies typically print the medication instructions only on the boxes and not directly on the medication tubes.   If your medication is too  expensive, please contact our office at [phone] option 4 or send [place] a message through [place].   We are unable to tell what your co-pay for medications will be in advance as this is different depending on your insurance coverage. However, we may be able to find a substitute medication at lower cost or fill out paperwork to get insurance to cover a needed medication.   If a prior authorization is required to get your medication covered by your insurance company, please allow [place] 1-2 business days to complete this process.  Drug prices often vary depending on where the prescription is filled and some pharmacies may offer cheaper prices.  The website www.goodrx.com contains coupons for medications through different pharmacies. The prices here do not account for what the cost may be with help from insurance (it may be cheaper with your insurance), but the website can give you the price if you did not use any insurance.  - You can print the associated coupon and take it with your prescription to the pharmacy.  - You may also stop by our office during regular business hours and pick up a GoodRx coupon card.  - If you need your  prescription sent electronically to a different pharmacy, notify our office through [facility] or by phone at [phone] option 4.     Si Usted Necesita Algo Despus de Su Visita  Tambin puede enviarnos un mensaje a travs de Pharmacist, community. Por lo general respondemos a los mensajes de MyChart en el transcurso de 1 a 2 das hbiles.  Para renovar recetas, por favor pida a su farmacia que se ponga en contacto con nuestra oficina. Harland Dingwall de fax es [place] [phone].  Si tiene un asunto urgente cuando la clnica est cerrada y que no puede esperar hasta el siguiente da hbil, puede llamar/localizar a su doctor(a) al nmero que aparece a continuacin.   Por favor, tenga en cuenta que aunque hacemos todo lo posible para estar disponibles para asuntos urgentes fuera del horario de [place], no estamos disponibles las 24 horas del da, los 7 das de la [place].   Si tiene un problema urgente y no puede comunicarse con nosotros, puede optar por buscar atencin mdica  en el consultorio de su doctor(a), en una clnica privada, en un centro de atencin urgente o en una sala de emergencias.  Si tiene Engineering geologist, por favor llame inmediatamente al 911 o vaya a la sala de emergencias.  Nmeros de bper  - Dr. Nehemiah Massed: [phone]  - Dra. Moye: [phone]  - Dra. Nicole Kindred: [phone]  En caso de inclemencias del [place], por favor llame a Johnsie Kindred principal al [phone] para una actualizacin sobre el [place] de cualquier retraso o cierre.  Consejos para la medicacin en dermatologa: Por favor, guarde las cajas en las que vienen los medicamentos de uso tpico para ayudarle a seguir las instrucciones sobre dnde y cmo usarlos. Las farmacias generalmente imprimen las instrucciones del medicamento slo en las cajas y no directamente en los tubos del [place].   Si su medicamento es muy caro, por favor, pngase en contacto con Zigmund Daniel llamando al [phone]  y presione la opcin 4 o envenos un mensaje a travs de Pharmacist, community.   No podemos decirle cul ser su copago por los medicamentos por adelantado ya que esto es diferente dependiendo de la cobertura de su seguro. Sin embargo, es posible que podamos encontrar un medicamento sustituto a Electrical engineer un formulario para que el seguro  cubra el medicamento que se considera necesario.   Si se requiere una autorizacin previa para que su compaa de seguros [place] su medicamento, por favor permtanos de 1 a 2 das hbiles para completar este proceso.  Los precios de los medicamentos varan con frecuencia dependiendo del Environmental consultant de dnde se surte la receta y alguna farmacias pueden ofrecer precios ms baratos.  El sitio web www.goodrx.com tiene cupones para medicamentos de Airline pilot. Los precios aqu no tienen en cuenta lo que podra costar con la ayuda del seguro (puede ser ms barato con su seguro), pero el sitio web puede darle el precio si no utiliz Research scientist (physical sciences).  - Puede imprimir el cupn correspondiente y llevarlo con su receta a la farmacia.  - Tambin puede pasar por nuestra oficina durante el horario de atencin regular y Charity fundraiser una tarjeta de cupones de GoodRx.  - Si necesita que su receta se enve electrnicamente a una farmacia diferente, informe a nuestra oficina a travs de MyChart de [place] o por telfono llamando al [phone] y presione la opcin 4.

## 2022-05-16 NOTE — Progress Notes (Signed)
Follow-Up Visit   Subjective  Alan Stewart is a 76 y.o. male who presents for the following: lesion (Left arm. Dur: several weeks. Tender at times. Grew fast).  The patient has spots, moles and lesions to be evaluated, some may be new or changing and the patient has concerns that these could be cancer.  The following portions of the chart were reviewed this encounter and updated as appropriate:  Tobacco  Allergies  Meds  Problems  Med Hx  Surg Hx  Fam Hx      Review of Systems: No other skin or systemic complaints except as noted in HPI or Assessment and Plan.   Objective  Well appearing patient in no apparent distress; mood and affect are within normal limits.  A focused examination was performed including left forearm. Relevant physical exam findings are noted in the Assessment and Plan.  Left Dorsal Forearm 2.2cm erythematous firm scaly nodule within scaly pink plaque      Assessment & Plan  Neoplasm of skin Left Dorsal Forearm  Epidermal / dermal shaving  Lesion diameter (cm):  2.8 Informed consent: discussed and consent obtained   Patient was prepped and draped in usual sterile fashion: Area prepped with alcohol. Anesthesia: the lesion was anesthetized in a standard fashion   Anesthetic:  1% lidocaine w/ epinephrine 1-100,000 buffered w/ 8.4% NaHCO3 Instrument used: flexible razor blade   Hemostasis achieved with: pressure, aluminum chloride and electrodesiccation   Outcome: patient tolerated procedure well   Post-procedure details: wound care instructions given   Post-procedure details comment:  Ointment and small bandage applied  Destruction of lesion  Destruction method: electrodesiccation and curettage   Informed consent: discussed and consent obtained   Timeout:  patient name, date of birth, surgical site, and procedure verified Anesthesia: the lesion was anesthetized in a standard fashion   Anesthetic:  1% lidocaine w/ epinephrine 1-100,000  buffered w/ 8.4% NaHCO3 Curettage performed in three different directions: Yes   Electrodesiccation performed over the curetted area: Yes   Curettage cycles:  3 Final wound size (cm):  3.4 Hemostasis achieved with:  electrodesiccation Outcome: patient tolerated procedure well with no complications   Post-procedure details: sterile dressing applied and wound care instructions given   Dressing type: petrolatum    Specimen 1 - Surgical pathology Differential Diagnosis: SCC/KA type  Check Margins: No  ED&C has about an 85% cure rate and leaves a round wound the size of the skin cancer which is healed with ointment and a bandage over a few weeks time. It leaves a round white scar. No additional pathology is done. If the skin cancer were to come back, we would need to do a surgery to remove it.   Excision involves cutting out the spot with an area of normal looking skin around it followed by closing it with stitches. The cure rate is approximately 92-93%. It leaves a line scar, and you must take it easy for two weeks after surgery (no lifting over 10-15 lbs, avoid activity to get your heart rate and blood pressure up).    Pt prefers ED&C. Pt voices understanding that if the cancer comes back, it might require a larger surgery or more involved treatment.  Did discuss this may have a higher risk of recurrence given the size. He will monitor closely and come back in for evaluation if he develops any lumps and bumps at this area. If this recurs, would likely send for Mohs.   Actinic Damage - chronic, secondary to  cumulative UV radiation exposure/sun exposure over time - diffuse scaly erythematous macules with underlying dyspigmentation - Recommend daily broad spectrum sunscreen SPF 30+ to sun-exposed areas, reapply every 2 hours as needed.  - Recommend staying in the shade or wearing long sleeves, sun glasses (UVA+UVB protection) and wide brim hats (4-inch brim around the entire circumference of  the hat). - Call for new or changing lesions. - Recommend taking Heliocare sun protection supplement daily in sunny weather for additional sun protection. For maximum protection on the sunniest days, you can take up to 2 capsules of regular Heliocare OR take 1 capsule of Heliocare Ultra. For prolonged exposure (such as a full day in the sun), you can repeat your dose of the supplement 4 hours after your first dose.   Return for Biopsy Follow Up 1 week, TBSE Next available .  I, Emelia Salisbury, CMA, am acting as scribe for Forest Gleason, MD.  Documentation: I have reviewed the above documentation for accuracy and completeness, and I agree with the above.  Forest Gleason, MD

## 2022-05-17 ENCOUNTER — Encounter: Payer: Self-pay | Admitting: Dermatology

## 2022-05-20 ENCOUNTER — Emergency Department (HOSPITAL_COMMUNITY): Payer: Medicare HMO

## 2022-05-20 ENCOUNTER — Encounter (HOSPITAL_COMMUNITY)
Admission: EM | Disposition: A | Discharge status: Home / Self Care | Payer: Self-pay | Source: Home / Self Care | Attending: Neurology

## 2022-05-20 ENCOUNTER — Telehealth: Payer: Self-pay

## 2022-05-20 ENCOUNTER — Inpatient Hospital Stay (HOSPITAL_COMMUNITY)
Admission: EM | Admit: 2022-05-20 | Discharge: 2022-05-23 | DRG: 023 | Disposition: A | Discharge status: Home / Self Care | Payer: Medicare HMO | Attending: Neurology | Admitting: Neurology

## 2022-05-20 DIAGNOSIS — I63412 Cerebral infarction due to embolism of left middle cerebral artery: Secondary | ICD-10-CM | POA: Diagnosis not present

## 2022-05-20 DIAGNOSIS — Z6841 Body Mass Index (BMI) 40.0 and over, adult: Secondary | ICD-10-CM

## 2022-05-20 DIAGNOSIS — R29710 NIHSS score 10: Secondary | ICD-10-CM | POA: Diagnosis not present

## 2022-05-20 DIAGNOSIS — I6523 Occlusion and stenosis of bilateral carotid arteries: Secondary | ICD-10-CM | POA: Diagnosis present

## 2022-05-20 DIAGNOSIS — Z85828 Personal history of other malignant neoplasm of skin: Secondary | ICD-10-CM

## 2022-05-20 DIAGNOSIS — Z7902 Long term (current) use of antithrombotics/antiplatelets: Secondary | ICD-10-CM

## 2022-05-20 DIAGNOSIS — Z9282 Status post administration of tPA (rtPA) in a different facility within the last 24 hours prior to admission to current facility: Secondary | ICD-10-CM | POA: Diagnosis not present

## 2022-05-20 DIAGNOSIS — I639 Cerebral infarction, unspecified: Secondary | ICD-10-CM | POA: Diagnosis not present

## 2022-05-20 DIAGNOSIS — M1990 Unspecified osteoarthritis, unspecified site: Secondary | ICD-10-CM | POA: Diagnosis present

## 2022-05-20 DIAGNOSIS — S066X0A Traumatic subarachnoid hemorrhage without loss of consciousness, initial encounter: Secondary | ICD-10-CM | POA: Diagnosis not present

## 2022-05-20 DIAGNOSIS — G8191 Hemiplegia, unspecified affecting right dominant side: Secondary | ICD-10-CM | POA: Diagnosis present

## 2022-05-20 DIAGNOSIS — Z7982 Long term (current) use of aspirin: Secondary | ICD-10-CM

## 2022-05-20 DIAGNOSIS — R4701 Aphasia: Secondary | ICD-10-CM | POA: Diagnosis present

## 2022-05-20 DIAGNOSIS — E039 Hypothyroidism, unspecified: Secondary | ICD-10-CM | POA: Diagnosis not present

## 2022-05-20 DIAGNOSIS — R29724 NIHSS score 24: Secondary | ICD-10-CM | POA: Diagnosis present

## 2022-05-20 DIAGNOSIS — I2510 Atherosclerotic heart disease of native coronary artery without angina pectoris: Secondary | ICD-10-CM | POA: Diagnosis present

## 2022-05-20 DIAGNOSIS — Z79890 Hormone replacement therapy (postmenopausal): Secondary | ICD-10-CM

## 2022-05-20 DIAGNOSIS — G4733 Obstructive sleep apnea (adult) (pediatric): Secondary | ICD-10-CM | POA: Diagnosis present

## 2022-05-20 DIAGNOSIS — R414 Neurologic neglect syndrome: Secondary | ICD-10-CM | POA: Diagnosis not present

## 2022-05-20 DIAGNOSIS — E782 Mixed hyperlipidemia: Secondary | ICD-10-CM | POA: Diagnosis not present

## 2022-05-20 DIAGNOSIS — Z79899 Other long term (current) drug therapy: Secondary | ICD-10-CM

## 2022-05-20 DIAGNOSIS — R404 Transient alteration of awareness: Secondary | ICD-10-CM | POA: Diagnosis not present

## 2022-05-20 DIAGNOSIS — I63512 Cerebral infarction due to unspecified occlusion or stenosis of left middle cerebral artery: Principal | ICD-10-CM | POA: Diagnosis present

## 2022-05-20 DIAGNOSIS — E7800 Pure hypercholesterolemia, unspecified: Secondary | ICD-10-CM | POA: Diagnosis not present

## 2022-05-20 DIAGNOSIS — R29720 NIHSS score 20: Secondary | ICD-10-CM | POA: Diagnosis not present

## 2022-05-20 DIAGNOSIS — I63522 Cerebral infarction due to unspecified occlusion or stenosis of left anterior cerebral artery: Secondary | ICD-10-CM | POA: Diagnosis not present

## 2022-05-20 DIAGNOSIS — I252 Old myocardial infarction: Secondary | ICD-10-CM

## 2022-05-20 DIAGNOSIS — I6602 Occlusion and stenosis of left middle cerebral artery: Secondary | ICD-10-CM

## 2022-05-20 DIAGNOSIS — Z955 Presence of coronary angioplasty implant and graft: Secondary | ICD-10-CM

## 2022-05-20 DIAGNOSIS — R29722 NIHSS score 22: Secondary | ICD-10-CM | POA: Diagnosis not present

## 2022-05-20 DIAGNOSIS — I63232 Cerebral infarction due to unspecified occlusion or stenosis of left carotid arteries: Secondary | ICD-10-CM | POA: Diagnosis not present

## 2022-05-20 DIAGNOSIS — I609 Nontraumatic subarachnoid hemorrhage, unspecified: Secondary | ICD-10-CM | POA: Diagnosis present

## 2022-05-20 DIAGNOSIS — Z87891 Personal history of nicotine dependence: Secondary | ICD-10-CM | POA: Diagnosis not present

## 2022-05-20 DIAGNOSIS — R29707 NIHSS score 7: Secondary | ICD-10-CM | POA: Diagnosis not present

## 2022-05-20 DIAGNOSIS — R29810 Facial weakness: Secondary | ICD-10-CM | POA: Diagnosis present

## 2022-05-20 DIAGNOSIS — I6389 Other cerebral infarction: Secondary | ICD-10-CM | POA: Diagnosis not present

## 2022-05-20 DIAGNOSIS — R410 Disorientation, unspecified: Secondary | ICD-10-CM | POA: Diagnosis not present

## 2022-05-20 DIAGNOSIS — E785 Hyperlipidemia, unspecified: Secondary | ICD-10-CM | POA: Diagnosis present

## 2022-05-20 DIAGNOSIS — I63322 Cerebral infarction due to thrombosis of left anterior cerebral artery: Secondary | ICD-10-CM | POA: Diagnosis not present

## 2022-05-20 DIAGNOSIS — G4730 Sleep apnea, unspecified: Secondary | ICD-10-CM | POA: Diagnosis not present

## 2022-05-20 DIAGNOSIS — R609 Edema, unspecified: Secondary | ICD-10-CM | POA: Diagnosis not present

## 2022-05-20 DIAGNOSIS — R001 Bradycardia, unspecified: Secondary | ICD-10-CM | POA: Diagnosis not present

## 2022-05-20 DIAGNOSIS — E781 Pure hyperglyceridemia: Secondary | ICD-10-CM | POA: Diagnosis not present

## 2022-05-20 DIAGNOSIS — I10 Essential (primary) hypertension: Secondary | ICD-10-CM | POA: Diagnosis present

## 2022-05-20 DIAGNOSIS — I25119 Atherosclerotic heart disease of native coronary artery with unspecified angina pectoris: Secondary | ICD-10-CM | POA: Diagnosis not present

## 2022-05-20 DIAGNOSIS — R29818 Other symptoms and signs involving the nervous system: Secondary | ICD-10-CM | POA: Diagnosis not present

## 2022-05-20 HISTORY — PX: RADIOLOGY WITH ANESTHESIA: SHX6223

## 2022-05-20 HISTORY — DX: Cerebral infarction due to unspecified occlusion or stenosis of left middle cerebral artery: I63.512

## 2022-05-20 LAB — CBC
HCT: 43.7 % (ref 39.0–52.0)
Hemoglobin: 14.3 g/dL (ref 13.0–17.0)
MCH: 29.5 pg (ref 26.0–34.0)
MCHC: 32.7 g/dL (ref 30.0–36.0)
MCV: 90.3 fL (ref 80.0–100.0)
Platelets: 243 10*3/uL (ref 150–400)
RBC: 4.84 MIL/uL (ref 4.22–5.81)
RDW: 14.3 % (ref 11.5–15.5)
WBC: 6.8 10*3/uL (ref 4.0–10.5)
nRBC: 0 % (ref 0.0–0.2)

## 2022-05-20 LAB — DIFFERENTIAL
Abs Immature Granulocytes: 0.02 10*3/uL (ref 0.00–0.07)
Basophils Absolute: 0 10*3/uL (ref 0.0–0.1)
Basophils Relative: 1 %
Eosinophils Absolute: 0.2 10*3/uL (ref 0.0–0.5)
Eosinophils Relative: 3 %
Immature Granulocytes: 0 %
Lymphocytes Relative: 34 %
Lymphs Abs: 2.3 10*3/uL (ref 0.7–4.0)
Monocytes Absolute: 0.6 10*3/uL (ref 0.1–1.0)
Monocytes Relative: 9 %
Neutro Abs: 3.6 10*3/uL (ref 1.7–7.7)
Neutrophils Relative %: 53 %

## 2022-05-20 LAB — PROTIME-INR
INR: 1 (ref 0.8–1.2)
Prothrombin Time: 12.9 seconds (ref 11.4–15.2)

## 2022-05-20 LAB — ETHANOL: Alcohol, Ethyl (B): <10 mg/dL (ref <10)

## 2022-05-20 LAB — APTT: aPTT: 25 seconds (ref 24–36)

## 2022-05-20 LAB — I-STAT CHEM 8, ED
BUN: 19 mg/dL (ref 8–23)
Calcium, Ion: 1.16 mmol/L (ref 1.15–1.40)
Chloride: 105 mmol/L (ref 98–111)
Creatinine, Ser: 1.2 mg/dL (ref 0.61–1.24)
Glucose, Bld: 109 mg/dL — ABNORMAL HIGH (ref 70–99)
HCT: 43 % (ref 39.0–52.0)
Hemoglobin: 14.6 g/dL (ref 13.0–17.0)
Potassium: 4 mmol/L (ref 3.5–5.1)
Sodium: 141 mmol/L (ref 135–145)
TCO2: 29 mmol/L (ref 22–32)

## 2022-05-20 LAB — CBG MONITORING, ED
Glucose-Capillary: 111 mg/dL — ABNORMAL HIGH (ref 70–99)
Glucose-Capillary: 120 mg/dL — ABNORMAL HIGH (ref 70–99)

## 2022-05-20 SURGERY — IR WITH ANESTHESIA
Anesthesia: General

## 2022-05-20 MED ORDER — FENTANYL CITRATE (PF) 100 MCG/2ML IJ SOLN
INTRAMUSCULAR | Status: AC
  Filled 2022-05-20: qty 2

## 2022-05-20 MED ORDER — ONDANSETRON HCL 4 MG/2ML IJ SOLN
INTRAMUSCULAR | Status: AC
  Administered 2022-05-20: 4 mg
  Filled 2022-05-20: qty 2

## 2022-05-20 MED ORDER — SODIUM CHLORIDE 0.9% FLUSH: 3.0000 mL | Freq: Once | INTRAVENOUS | Status: DC

## 2022-05-20 MED ORDER — IOHEXOL 350 MG/ML SOLN
75.0000 mL | Freq: Once | INTRAVENOUS | Status: AC | PRN
  Administered 2022-05-20: 75 mL via INTRAVENOUS

## 2022-05-20 MED ORDER — TENECTEPLASE FOR STROKE
25.0000 mg | PACK | Freq: Once | INTRAVENOUS | Status: AC
  Administered 2022-05-20: 25 mg via INTRAVENOUS
  Filled 2022-05-20: qty 10

## 2022-05-20 NOTE — Telephone Encounter (Signed)
Patient advised bx showed SCC, already treated at time of biopsy. Will monitor for recurrence. Lurlean Horns., RMA

## 2022-05-20 NOTE — Progress Notes (Signed)
PHARMACIST CODE STROKE RESPONSE  Notified to mix TNK at 1126 by Dr. Leonel Ramsay TNK preparation completed at 1129 - Decision to hold TNK for CTA completion at this time 1134 - 1142 Shared decision making on Thrombectomy alone vs TNK & Thrombectomy given patient with recent skin procedure that was oozing. Decision made ultimately to give TNK and administered at 1142  TNK dose = 25 mg IV over 5 seconds   Alan Stewart 05/20/22 11:43 PM

## 2022-05-20 NOTE — Telephone Encounter (Signed)
-----   Message from Alfonso Patten, MD sent at 05/20/2022  5:12 PM EDT ----- Skin (M), left dorsal forearm SQUAMOUS CELL CARCINOMA, KERATOACANTHOMA TYPE  Already treated with ED&C, monitor for recurrence  MAs please call. Thank you!

## 2022-05-20 NOTE — Code Documentation (Signed)
Stroke Response Nurse Documentation Code Documentation  Alan Stewart is a 76 y.o. male arriving to [facility]  via [facility] on 10/2 with past medical hx of CAD with MI, HTN, HLD, Obesity, OSA. On aspirin 325 mg daily and Brilinta (ticagrelor) 90 mg bid. Code stroke was activated by EMS.   Patient from home where he was LKW at 2200 and now complaining of right sided hemiplegia, aphasia .   Stroke team at the bedside on patient arrival. Labs drawn and patient cleared for CT by Dr. Dina Rich. Patient to CT with team. NIHSS 24, see documentation for details and code stroke times. Patient with disoriented, not following commands, left gaze preference , right hemianopia, right facial droop, right arm weakness, right leg weakness, right decreased sensation, Global aphasia , and dysarthria  on exam. The following imaging was completed:  CT Head and CTA. Patient is a candidate for IV Thrombolytic due to fixed neurological deficit. Patient is a candidate for IR due to LVO.   Care Plan: Mechanical Thrombectomy.   Bedside handoff with IR RN .    Madelynn Done  Rapid Response RN

## 2022-05-20 NOTE — H&P (Signed)
Neurology H&P  CC: Right sided weakness  History is obtained from:EMS  HPI: Alan Stewart is a 76 y.o. male with a history of htn, hpl, CAD who presents with right-sided weakness and aphasia.  He was in his normal state of health at 10 PM when his wife went to bed, but then when he came to bed shortly thereafter, he seemed off.  She asked him what was wrong and he did not answer.  When he would not speak, she called 911.  He arrived as a code stroke and was taken for an emergent CT/CTA.  This demonstrated an left M1 occlusion.  He is within the time window for IV tenecteplase.  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  I discussed risks, benefits, and alternatives of IV tenecteplase with the patient's wife and strongly encouraged her to proceed given the severity of his symptoms and she agreed.  When CTA revealed the large vessel occlusion, we discussed thrombectomy with the patient's wife who again agreed to proceed.  LKW: 10 pm tpa given?:  Yes IR Thrombectomy?  Yes Modified Rankin Scale: 0-Completely asymptomatic and back to baseline post- stroke NIHSS: 24    Past Medical History:  Diagnosis Date   CAD (coronary artery disease)    a. 10/2021 NSTEMI/PCI: LM nl, LAD min irregs, LCX 66m(2.5x18 Onyx Frontier DES), OM1 nl, RCA 935m3.5x26 Onyx Frontier DES), 30d.   Diastolic dysfunction    a. 10/2021 Echo: EF 50-55%, no rwma, Gr1 DD, nl RV fxn. No significant valvular dzs.   Hyperglycemia    Hyperlipidemia    Hypertension    Morbid [place] ([place]   Osteoarthritis    SCC (squamous cell carcinoma) 05/16/2022   left dorsal forearm, EDC   Sleep apnea      No family history on file.   Social History:  reports that he quit smoking about 31 years ago. His smoking use included cigarettes. He has a 15.00 pack-year smoking history. He has quit using smokeless tobacco. He reports current alcohol use.  He reports that he does not use drugs.   Prior to Admission medications   Medication Sig Start Date End Date Taking? Authorizing Provider  aspirin EC 81 MG EC tablet Take 1 tablet (81 mg total) by mouth daily. Swallow whole. 10/22/21   GiDwyane DeeMD  atorvastatin (LIPITOR) 80 MG tablet Take 1 tablet (80 mg total) by mouth daily. 10/23/21   GiDwyane DeeMD  carvedilol (COREG) 6.25 MG tablet Take 1 tablet (6.25 mg total) by mouth 2 (two) times daily with a meal. 03/21/22   ArWellington HampshireMD  ezetimibe (ZETIA) 10 MG tablet Take 1 tablet (10 mg total) by mouth daily. 12/18/21 03/21/22  BeTheora GianottiNP  levothyroxine (SYNTHROID) 75 MCG tablet Take 75 mcg by mouth daily before breakfast. 11/01/21 11/01/22  [provider]  nitroGLYCERIN (NITROSTAT) 0.4 MG SL tablet Place 1 tablet (0.4 mg total) under the tongue every 5 (five) minutes as needed for chest pain. 10/22/21   GiDwyane DeeMD  ticagrelor (BRILINTA) 90 MG TABS tablet Take 1 tablet (90 mg total) by mouth 2 (two) times daily. 03/21/22   ArWellington HampshireMD     Exam: Current vital signs: There were no vitals taken for this visit.   Physical Exam  Constitutional: Appears well-developed and well-nourished.  Psych: Affect appropriate to situation Eyes: No  scleral injection HENT: No OP obstrucion Head: Normocephalic.  Cardiovascular: Normal rate and regular rhythm.  Respiratory: Effort normal and breath sounds normal to anterior ascultation GI: Soft.  There is no tenderness.  He has a prominent umbilical hernia Skin: He has a shallow healing wound in the left forearm  Neuro: Mental Status: Patient is awake, alert, densely aphasic Cranial Nerves: II: No blink to threat from the right. Pupils are equal, round, and reactive to light.   III,IV, VI: He has a left gaze deviation VII: Facial movement is notable for left facial weakness Motor: He has a dense right hemiplegia with good movement on the left  sensory: No response to noxious stimulation on the right Cerebellar: Does not perform  I have reviewed labs in epic and the pertinent results are: Results for orders placed or performed during the hospital encounter of 05/20/22 (from the past 24 hour(s))  CBG monitoring, ED     Status: Abnormal   Collection Time: 05/20/22 11:12 PM  Result Value Ref Range   Glucose-Capillary 120 (H) 70 - 99 mg/dL  Protime-INR     Status: None   Collection Time: 05/20/22 11:15 PM  Result Value Ref Range   Prothrombin Time 12.9 11.4 - 15.2 seconds   INR 1.0 0.8 - 1.2  APTT     Status: None   Collection Time: 05/20/22 11:15 PM  Result Value Ref Range   aPTT 25 24 - 36 seconds  CBC     Status: None   Collection Time: 05/20/22 11:15 PM  Result Value Ref Range   WBC 6.8 4.0 - 10.5 K/uL   RBC 4.84 4.22 - 5.81 MIL/uL   Hemoglobin 14.3 13.0 - 17.0 g/dL   HCT 43.7 39.0 - 52.0 %   MCV 90.3 80.0 - 100.0 fL   MCH 29.5 26.0 - 34.0 pg   MCHC 32.7 30.0 - 36.0 g/dL   RDW 14.3 11.5 - 15.5 %   Platelets 243 150 - 400 K/uL   nRBC 0.0 0.0 - 0.2 %  Differential     Status: None   Collection Time: 05/20/22 11:15 PM  Result Value Ref Range   Neutrophils Relative % 53 %   Neutro Abs 3.6 1.7 - 7.7 K/uL   Lymphocytes Relative 34 %   Lymphs Abs 2.3 0.7 - 4.0 K/uL   Monocytes Relative 9 %   Monocytes Absolute 0.6 0.1 - 1.0 K/uL   Eosinophils Relative 3 %   Eosinophils Absolute 0.2 0.0 - 0.5 K/uL   Basophils Relative 1 %   Basophils Absolute 0.0 0.0 - 0.1 K/uL   Immature Granulocytes 0 %   Abs Immature Granulocytes 0.02 0.00 - 0.07 K/uL  Comprehensive metabolic panel     Status: Abnormal   Collection Time: 05/20/22 11:15 PM  Result Value Ref Range   Sodium 142 135 - 145 mmol/L   Potassium 4.2 3.5 - 5.1 mmol/L   Chloride 108 98 - 111 mmol/L   CO2 22 22 - 32 mmol/L   Glucose, Bld 117 (H) 70 - 99 mg/dL   BUN 17 8 - 23 mg/dL   Creatinine, Ser 1.20 0.61 - 1.24 mg/dL   Calcium 9.3 8.9 - 10.3 mg/dL   Total  Protein 6.1 (L) 6.5 - 8.1 g/dL   Albumin 3.3 (L) 3.5 - 5.0 g/dL   AST 29 15 - 41 U/L   ALT 30 0 - 44 U/L   Alkaline Phosphatase 105 38 - 126 U/L   Total Bilirubin 0.6  0.3 - 1.2 mg/dL   GFR, Estimated >60 >60 mL/min   Anion gap 12 5 - 15  Ethanol     Status: None   Collection Time: 05/20/22 11:15 PM  Result Value Ref Range   Alcohol, Ethyl (B) <10 <10 mg/dL  I-stat chem 8, ED     Status: Abnormal   Collection Time: 05/20/22 11:19 PM  Result Value Ref Range   Sodium 141 135 - 145 mmol/L   Potassium 4.0 3.5 - 5.1 mmol/L   Chloride 105 98 - 111 mmol/L   BUN 19 8 - 23 mg/dL   Creatinine, Ser 1.20 0.61 - 1.24 mg/dL   Glucose, Bld 109 (H) 70 - 99 mg/dL   Calcium, Ion 1.16 1.15 - 1.40 mmol/L   TCO2 29 22 - 32 mmol/L   Hemoglobin 14.6 13.0 - 17.0 g/dL   HCT 43.0 39.0 - 52.0 %  CBG monitoring, ED     Status: Abnormal   Collection Time: 05/20/22 11:37 PM  Result Value Ref Range   Glucose-Capillary 111 (H) 70 - 99 mg/dL  Glucose, capillary     Status: Abnormal   Collection Time: 05/21/22  1:49 AM  Result Value Ref Range   Glucose-Capillary 179 (H) 70 - 99 mg/dL     I have reviewed the images obtained:CT/CTA - L MCA occlusion with aspects 10  Primary Diagnosis:  Cerebral infarction due to embolism of  left middle cerebral artery.   Secondary Diagnosis: Essential (primary) hypertension and Morbid Obesity(BMI > 40)   Impression: 76 year old with left MCA occlusion, likely embolic.  He does have a history of coronary artery disease, suspect cardioembolic source.  He will need ICU care after thrombectomy.  Plan: - HgbA1c, fasting lipid panel - MRI of the brain without contrast - Frequent neuro checks - Echocardiogram - Prophylactic therapy-none for 24 hours, then resume dual antiplatelet therapy with aspirin and Brilinta - Risk factor modification - Telemetry monitoring - PT consult, OT consult, Speech consult - Stroke team to follow    This patient is critically ill and at  significant risk of neurological worsening, death and care requires constant monitoring of vital signs, hemodynamics,respiratory and cardiac monitoring, neurological assessment, discussion with family, other specialists and medical decision making of high complexity. I spent 65 minutes of neurocritical care time  in the care of  this patient. This was time spent independent of any time provided by nurse practitioner or PA.  Roland Rack, MD [facility] [phone]  If 7pm- 7am, please page neurology on call as listed in [place].

## 2022-05-20 NOTE — ED Provider Notes (Signed)
[place] EMERGENCY DEPARTMENT Provider Note   CSN: [id number] Arrival date & time: 05/20/22  2309     History  No chief complaint on file.   Alan Stewart is a 76 y.o. male.  HPI     This is a 76 year old male with reported history of hypertension, hyperlipidemia, coronary artery disease who presents with strokelike symptoms.  Per EMS last seen normal by his wife at 10:15 PM.  They state that he woke up and was not making any sense and was mumbling.  Noted to have a right-sided facial droop and right sided neglect.  Code stroke was called in the field.  Patient reportedly had a systolic blood pressure of 140.  Level 5 caveat.  Patient was cleared to the bridge for CT scan.  Neurology at the bedside.  Home Medications Prior to Admission medications   Medication Sig Start Date End Date Taking? Authorizing Provider  aspirin EC 81 MG EC tablet Take 1 tablet (81 mg total) by mouth daily. Swallow whole. 10/22/21   Dwyane Dee, MD  atorvastatin (LIPITOR) 80 MG tablet Take 1 tablet (80 mg total) by mouth daily. 10/23/21   Dwyane Dee, MD  carvedilol (COREG) 6.25 MG tablet Take 1 tablet (6.25 mg total) by mouth 2 (two) times daily with a meal. 03/21/22   Wellington Hampshire, MD  ezetimibe (ZETIA) 10 MG tablet Take 1 tablet (10 mg total) by mouth daily. 12/18/21 03/21/22  Theora Gianotti, NP  levothyroxine (SYNTHROID) 75 MCG tablet Take 75 mcg by mouth daily before breakfast. 11/01/21 11/01/22  [provider]  nitroGLYCERIN (NITROSTAT) 0.4 MG SL tablet Place 1 tablet (0.4 mg total) under the tongue every 5 (five) minutes as needed for chest pain. 10/22/21   Dwyane Dee, MD  ticagrelor (BRILINTA) 90 MG TABS tablet Take 1 tablet (90 mg total) by mouth 2 (two) times daily. 03/21/22   Wellington Hampshire, MD      Allergies    Patient has no known allergies.    Review of Systems   Review of Systems  Unable to perform ROS: Acuity of condition    Physical  Exam Updated Vital Signs There were no vitals taken for this visit. Physical Exam Vitals and nursing note reviewed.  Constitutional:      Appearance: He is well-developed.     Comments: Awake, obese  HENT:     Head: Normocephalic and atraumatic.  Eyes:     Pupils: Pupils are equal, round, and reactive to light.     Comments: Does not move past midline to the right  Cardiovascular:     Rate and Rhythm: Normal rate and regular rhythm.  Pulmonary:     Effort: Pulmonary effort is normal. No respiratory distress.  Abdominal:     Palpations: Abdomen is soft.  Musculoskeletal:     Cervical back: Neck supple.     Right lower leg: No edema.     Left lower leg: No edema.  Lymphadenopathy:     Cervical: No cervical adenopathy.  Skin:    General: Skin is warm and dry.  Neurological:     Mental Status: He is alert.     Comments: Unable to assess orientation, appears to follow simple directions, aphasic, right-sided neglect, right facial droop noted  Psychiatric:     Comments: Unable to assess     ED Results / Procedures / Treatments   Labs (all labs ordered are listed, but only abnormal results are displayed) Labs Reviewed  CBG MONITORING, ED - Abnormal; Notable for the following components:      Result Value   Glucose-Capillary 120 (*)    All other components within normal limits  PROTIME-INR  APTT  CBC  DIFFERENTIAL  COMPREHENSIVE METABOLIC PANEL  ETHANOL  I-STAT CHEM 8, ED    EKG None  Radiology No results found.  Procedures .Critical Care  Performed by: Merryl Hacker, MD Authorized by: Merryl Hacker, MD   Critical care provider statement:    Critical care time (minutes):  31   Critical care was necessary to treat or prevent imminent or life-threatening deterioration of the following conditions:  CNS failure or compromise (Large CVA)   Critical care was time spent personally by me on the following activities:  Development of treatment plan with  patient or surrogate, discussions with consultants, evaluation of patient's response to treatment, examination of patient, ordering and review of laboratory studies, ordering and review of radiographic studies, ordering and performing treatments and interventions, pulse oximetry, re-evaluation of patient's condition and review of old charts     Medications Ordered in ED Medications  sodium chloride flush (NS) 0.9 % injection 3 mL (has no administration in time range)  ondansetron (ZOFRAN) 4 MG/2ML injection (has no administration in time range)  tenecteplase (TNKASE) injection for Stroke 25 mg (has no administration in time range)    ED Course/ Medical Decision Making/ A&P                           Medical Decision Making Amount and/or Complexity of Data Reviewed Labs: ordered. Radiology: ordered.  Risk Decision regarding hospitalization.   This patient presents to the ED for concern of stroke like symptoms, this involves an extensive number of treatment options, and is a complaint that carries with it a high risk of complications and morbidity.  I considered the following differential and admission for this acute, potentially life threatening condition.  The differential diagnosis includes CVA, encephalopathy, Todd's paralysis/seizure  MDM:    This is a 76 year old male who presents with strokelike symptoms.  He is cleared to the bridge for CT evaluation.  Has significant stroke symptoms.  He is well within the window for TNK and/or IR.  Neurology, Dr. Leonel Ramsay is at the bedside.  Patient taken to CT scan.  Lab work ordered.  Pending.  CT scan shows a large vessel occlusion.  Per neurology, patient will be taken emergently to interventional radiology.  He was noted to have a superficial wound on his left forearm.  This was thought to be highly compressible site and benefits outweighed risk for intervention.  11:48 PM Patient was reassessed in the CT scanner.  He had 1 episode of  emesis but was rolled immediately and able to clear his airway.  He is on a nonrebreather.  He is following commands.  Discussed with neurology possible need for intervention.  At this time I feel that he can go emergently to IR for anesthesia intubation.  (Labs, imaging, consults)  Labs: I Ordered, and personally interpreted labs.  The pertinent results include: Pending  Imaging Studies ordered: I ordered imaging studies including CT head shows large vessel occlusion I independently visualized and interpreted imaging. I agree with the radiologist interpretation  Additional history obtained from neurology, wife.  External records from outside source obtained and reviewed including prior evaluations  Cardiac Monitoring: The patient was maintained on a cardiac monitor.  I personally viewed and interpreted the  cardiac monitored which showed an underlying rhythm of: Sinus rhythm  Reevaluation: After the interventions noted above, I reevaluated the patient and found that they have :stayed the same  Social Determinants of Health:  lives independently with wife  Disposition: Admit to neurology  Co morbidities that complicate the patient evaluation  Past Medical History:  Diagnosis Date   CAD (coronary artery disease)    a. 10/2021 NSTEMI/PCI: LM nl, LAD min irregs, LCX 31m(2.5x18 Onyx Frontier DES), OM1 nl, RCA 962m3.5x26 Onyx Frontier DES), 30d.   Diastolic dysfunction    a. 10/2021 Echo: EF 50-55%, no rwma, Gr1 DD, nl RV fxn. No significant valvular dzs.   Hyperglycemia    Hyperlipidemia    Hypertension    Morbid obesity ([place]   Osteoarthritis    SCC (squamous cell carcinoma) 05/16/2022   left dorsal forearm, EDC   Sleep apnea      Medicines Meds ordered this encounter  Medications   sodium chloride flush (NS) 0.9 % injection 3 mL   ondansetron (ZOFRAN) 4 MG/2ML injection    [place] cabinet override   tenecteplase (TNKASE) injection for Stroke 25 mg    I  have reviewed the patients home medicines and have made adjustments as needed  Problem List / ED Course: Problem List Items Addressed This Visit   None Visit Diagnoses     Cerebrovascular accident (CVA), unspecified mechanism ([facility] [place]   -  Primary                   Final Clinical Impression(s) / ED Diagnoses Final diagnoses:  Cerebrovascular accident (CVA), unspecified mechanism ([facility]   Rx / [place] ED Discharge Orders     None         Ashni Lonzo, CoBarbette HairMD 05/20/22 2349

## 2022-05-20 NOTE — Anesthesia Preprocedure Evaluation (Addendum)
Anesthesia Evaluation  General Assessment Comment:Patient follows commands but is unable to speakPreop documentation limited or incomplete due to emergent nature of procedure.  Airway Mallampati: Unable to assess       Dental   Pulmonary sleep apnea , former smoker,    Pulmonary exam normal        Cardiovascular hypertension, Pt. on home beta blockers + CAD, + Past MI and + Cardiac Stents  Normal cardiovascular exam     Neuro/Psych CVA    GI/Hepatic   Endo/Other  Hypothyroidism Morbid obesity  Renal/GU      Musculoskeletal  (+) Arthritis ,   Abdominal (+) + obese,   Peds  Hematology   Anesthesia Other Findings Code Stroke  Reproductive/Obstetrics                            Anesthesia Physical Anesthesia Plan  ASA: 3 and emergent  Anesthesia Plan: General   Post-op Pain Management:    Induction: Intravenous and Rapid sequence  PONV Risk Score and Plan: 2 and Ondansetron, Dexamethasone and Treatment may vary due to age or medical condition  Airway Management Planned: Oral ETT  Additional Equipment: Arterial line  Intra-op Plan:   Post-operative Plan: Possible Post-op intubation/ventilation  Informed Consent:     Only emergency history available  Plan Discussed with: CRNA  Anesthesia Plan Comments:        Anesthesia Quick Evaluation

## 2022-05-21 ENCOUNTER — Encounter (HOSPITAL_COMMUNITY): Payer: Self-pay | Admitting: Interventional Radiology

## 2022-05-21 ENCOUNTER — Inpatient Hospital Stay (HOSPITAL_COMMUNITY): Payer: Medicare HMO

## 2022-05-21 ENCOUNTER — Emergency Department (HOSPITAL_COMMUNITY): Payer: Medicare HMO

## 2022-05-21 ENCOUNTER — Emergency Department (HOSPITAL_COMMUNITY): Payer: Medicare HMO | Admitting: Anesthesiology

## 2022-05-21 DIAGNOSIS — I6602 Occlusion and stenosis of left middle cerebral artery: Secondary | ICD-10-CM | POA: Diagnosis not present

## 2022-05-21 DIAGNOSIS — I6523 Occlusion and stenosis of bilateral carotid arteries: Secondary | ICD-10-CM | POA: Diagnosis present

## 2022-05-21 DIAGNOSIS — Z955 Presence of coronary angioplasty implant and graft: Secondary | ICD-10-CM | POA: Diagnosis not present

## 2022-05-21 DIAGNOSIS — G4733 Obstructive sleep apnea (adult) (pediatric): Secondary | ICD-10-CM | POA: Diagnosis present

## 2022-05-21 DIAGNOSIS — I25119 Atherosclerotic heart disease of native coronary artery with unspecified angina pectoris: Secondary | ICD-10-CM | POA: Diagnosis not present

## 2022-05-21 DIAGNOSIS — M1990 Unspecified osteoarthritis, unspecified site: Secondary | ICD-10-CM | POA: Diagnosis present

## 2022-05-21 DIAGNOSIS — R414 Neurologic neglect syndrome: Secondary | ICD-10-CM | POA: Diagnosis present

## 2022-05-21 DIAGNOSIS — R609 Edema, unspecified: Secondary | ICD-10-CM

## 2022-05-21 DIAGNOSIS — Z6841 Body Mass Index (BMI) 40.0 and over, adult: Secondary | ICD-10-CM | POA: Diagnosis not present

## 2022-05-21 DIAGNOSIS — I2510 Atherosclerotic heart disease of native coronary artery without angina pectoris: Secondary | ICD-10-CM | POA: Diagnosis present

## 2022-05-21 DIAGNOSIS — I63232 Cerebral infarction due to unspecified occlusion or stenosis of left carotid arteries: Secondary | ICD-10-CM

## 2022-05-21 DIAGNOSIS — E782 Mixed hyperlipidemia: Secondary | ICD-10-CM

## 2022-05-21 DIAGNOSIS — R29724 NIHSS score 24: Secondary | ICD-10-CM | POA: Diagnosis present

## 2022-05-21 DIAGNOSIS — E039 Hypothyroidism, unspecified: Secondary | ICD-10-CM

## 2022-05-21 DIAGNOSIS — I639 Cerebral infarction, unspecified: Secondary | ICD-10-CM | POA: Diagnosis present

## 2022-05-21 DIAGNOSIS — I63412 Cerebral infarction due to embolism of left middle cerebral artery: Secondary | ICD-10-CM | POA: Diagnosis not present

## 2022-05-21 DIAGNOSIS — I63522 Cerebral infarction due to unspecified occlusion or stenosis of left anterior cerebral artery: Secondary | ICD-10-CM

## 2022-05-21 DIAGNOSIS — R29707 NIHSS score 7: Secondary | ICD-10-CM | POA: Diagnosis not present

## 2022-05-21 DIAGNOSIS — Z85828 Personal history of other malignant neoplasm of skin: Secondary | ICD-10-CM | POA: Diagnosis not present

## 2022-05-21 DIAGNOSIS — G4730 Sleep apnea, unspecified: Secondary | ICD-10-CM | POA: Diagnosis not present

## 2022-05-21 DIAGNOSIS — I6389 Other cerebral infarction: Secondary | ICD-10-CM | POA: Diagnosis not present

## 2022-05-21 DIAGNOSIS — I609 Nontraumatic subarachnoid hemorrhage, unspecified: Secondary | ICD-10-CM | POA: Diagnosis present

## 2022-05-21 DIAGNOSIS — Z9282 Status post administration of tPA (rtPA) in a different facility within the last 24 hours prior to admission to current facility: Secondary | ICD-10-CM | POA: Diagnosis not present

## 2022-05-21 DIAGNOSIS — E7800 Pure hypercholesterolemia, unspecified: Secondary | ICD-10-CM | POA: Diagnosis not present

## 2022-05-21 DIAGNOSIS — I63512 Cerebral infarction due to unspecified occlusion or stenosis of left middle cerebral artery: Secondary | ICD-10-CM

## 2022-05-21 DIAGNOSIS — E781 Pure hyperglyceridemia: Secondary | ICD-10-CM | POA: Diagnosis not present

## 2022-05-21 DIAGNOSIS — I252 Old myocardial infarction: Secondary | ICD-10-CM | POA: Diagnosis not present

## 2022-05-21 DIAGNOSIS — Z7982 Long term (current) use of aspirin: Secondary | ICD-10-CM | POA: Diagnosis not present

## 2022-05-21 DIAGNOSIS — R29720 NIHSS score 20: Secondary | ICD-10-CM | POA: Diagnosis not present

## 2022-05-21 DIAGNOSIS — S066X0A Traumatic subarachnoid hemorrhage without loss of consciousness, initial encounter: Secondary | ICD-10-CM | POA: Diagnosis not present

## 2022-05-21 DIAGNOSIS — E785 Hyperlipidemia, unspecified: Secondary | ICD-10-CM | POA: Diagnosis present

## 2022-05-21 DIAGNOSIS — G8191 Hemiplegia, unspecified affecting right dominant side: Secondary | ICD-10-CM | POA: Diagnosis present

## 2022-05-21 DIAGNOSIS — R4701 Aphasia: Secondary | ICD-10-CM | POA: Diagnosis present

## 2022-05-21 DIAGNOSIS — R29710 NIHSS score 10: Secondary | ICD-10-CM | POA: Diagnosis not present

## 2022-05-21 DIAGNOSIS — R29810 Facial weakness: Secondary | ICD-10-CM | POA: Diagnosis present

## 2022-05-21 DIAGNOSIS — R29722 NIHSS score 22: Secondary | ICD-10-CM | POA: Diagnosis not present

## 2022-05-21 DIAGNOSIS — I63322 Cerebral infarction due to thrombosis of left anterior cerebral artery: Secondary | ICD-10-CM | POA: Diagnosis not present

## 2022-05-21 DIAGNOSIS — Z87891 Personal history of nicotine dependence: Secondary | ICD-10-CM | POA: Diagnosis not present

## 2022-05-21 DIAGNOSIS — I10 Essential (primary) hypertension: Secondary | ICD-10-CM | POA: Diagnosis not present

## 2022-05-21 HISTORY — PX: IR PERCUTANEOUS ART THROMBECTOMY/INFUSION INTRACRANIAL INC DIAG ANGIO: IMG6087

## 2022-05-21 HISTORY — PX: IR CT HEAD LTD: IMG2386

## 2022-05-21 LAB — BASIC METABOLIC PANEL
Anion gap: 6 (ref 5–15)
BUN: 15 mg/dL (ref 8–23)
CO2: 22 mmol/L (ref 22–32)
Calcium: 8.3 mg/dL — ABNORMAL LOW (ref 8.9–10.3)
Chloride: 110 mmol/L (ref 98–111)
Creatinine, Ser: 1.08 mg/dL (ref 0.61–1.24)
GFR, Estimated: >60 mL/min (ref >60)
Glucose, Bld: 195 mg/dL — ABNORMAL HIGH (ref 70–99)
Potassium: 4 mmol/L (ref 3.5–5.1)
Sodium: 138 mmol/L (ref 135–145)

## 2022-05-21 LAB — GLUCOSE, CAPILLARY
Glucose-Capillary: 179 mg/dL — ABNORMAL HIGH (ref 70–99)
Glucose-Capillary: 184 mg/dL — ABNORMAL HIGH (ref 70–99)
Glucose-Capillary: 140 mg/dL — ABNORMAL HIGH (ref 70–99)
Glucose-Capillary: 115 mg/dL — ABNORMAL HIGH (ref 70–99)
Glucose-Capillary: 119 mg/dL — ABNORMAL HIGH (ref 70–99)
Glucose-Capillary: 135 mg/dL — ABNORMAL HIGH (ref 70–99)
Glucose-Capillary: 107 mg/dL — ABNORMAL HIGH (ref 70–99)

## 2022-05-21 LAB — CBC WITH DIFFERENTIAL/PLATELET
Abs Immature Granulocytes: 0.04 10*3/uL (ref 0.00–0.07)
Basophils Absolute: 0 10*3/uL (ref 0.0–0.1)
Basophils Relative: 0 %
Eosinophils Absolute: 0 10*3/uL (ref 0.0–0.5)
Eosinophils Relative: 0 %
HCT: 39.8 % (ref 39.0–52.0)
Hemoglobin: 13.4 g/dL (ref 13.0–17.0)
Immature Granulocytes: 1 %
Lymphocytes Relative: 11 %
Lymphs Abs: 0.8 10*3/uL (ref 0.7–4.0)
MCH: 30.3 pg (ref 26.0–34.0)
MCHC: 33.7 g/dL (ref 30.0–36.0)
MCV: 90 fL (ref 80.0–100.0)
Monocytes Absolute: 0.4 10*3/uL (ref 0.1–1.0)
Monocytes Relative: 5 %
Neutro Abs: 5.9 10*3/uL (ref 1.7–7.7)
Neutrophils Relative %: 83 %
Platelets: 207 10*3/uL (ref 150–400)
RBC: 4.42 MIL/uL (ref 4.22–5.81)
RDW: 14.2 % (ref 11.5–15.5)
WBC: 7.2 10*3/uL (ref 4.0–10.5)
nRBC: 0 % (ref 0.0–0.2)

## 2022-05-21 LAB — LIPID PANEL
Cholesterol: 106 mg/dL (ref 0–200)
HDL: 33 mg/dL — ABNORMAL LOW (ref >40)
LDL Cholesterol: 56 mg/dL (ref 0–99)
Total CHOL/HDL Ratio: 3.2 RATIO
Triglycerides: 83 mg/dL (ref <150)
VLDL: 17 mg/dL (ref 0–40)

## 2022-05-21 LAB — ECHOCARDIOGRAM COMPLETE
Area-P 1/2: 3.37 cm2
Height: 67 in
S' Lateral: 3.2 cm
Weight: 4430.36 oz

## 2022-05-21 LAB — COMPREHENSIVE METABOLIC PANEL
ALT: 30 U/L (ref 0–44)
AST: 29 U/L (ref 15–41)
Albumin: 3.3 g/dL — ABNORMAL LOW (ref 3.5–5.0)
Alkaline Phosphatase: 105 U/L (ref 38–126)
Anion gap: 12 (ref 5–15)
BUN: 17 mg/dL (ref 8–23)
CO2: 22 mmol/L (ref 22–32)
Calcium: 9.3 mg/dL (ref 8.9–10.3)
Chloride: 108 mmol/L (ref 98–111)
Creatinine, Ser: 1.2 mg/dL (ref 0.61–1.24)
GFR, Estimated: >60 mL/min (ref >60)
Glucose, Bld: 117 mg/dL — ABNORMAL HIGH (ref 70–99)
Potassium: 4.2 mmol/L (ref 3.5–5.1)
Sodium: 142 mmol/L (ref 135–145)
Total Bilirubin: 0.6 mg/dL (ref 0.3–1.2)
Total Protein: 6.1 g/dL — ABNORMAL LOW (ref 6.5–8.1)

## 2022-05-21 LAB — RAPID URINE DRUG SCREEN, HOSP PERFORMED
Amphetamines: NOT DETECTED
Barbiturates: NOT DETECTED
Benzodiazepines: NOT DETECTED
Cocaine: NOT DETECTED
Opiates: NOT DETECTED
Tetrahydrocannabinol: NOT DETECTED

## 2022-05-21 LAB — MRSA NEXT GEN BY PCR, NASAL: MRSA by PCR Next Gen: NOT DETECTED

## 2022-05-21 LAB — HEMOGLOBIN A1C
Hgb A1c MFr Bld: 6 % — ABNORMAL HIGH (ref 4.8–5.6)
Mean Plasma Glucose: 125.5 mg/dL

## 2022-05-21 MED ORDER — INSULIN ASPART 100 UNIT/ML IJ SOLN
0.0000 [IU] | INTRAMUSCULAR | Status: DC
  Administered 2022-05-21: 2 [IU] via SUBCUTANEOUS
  Administered 2022-05-21: 3 [IU] via SUBCUTANEOUS
  Administered 2022-05-21 – 2022-05-22 (×4): 2 [IU] via SUBCUTANEOUS

## 2022-05-21 MED ORDER — SUCCINYLCHOLINE CHLORIDE 200 MG/10ML IV SOSY
PREFILLED_SYRINGE | INTRAVENOUS | Status: DC | PRN
  Administered 2022-05-21: 100 mg via INTRAVENOUS

## 2022-05-21 MED ORDER — ASPIRIN 81 MG PO CHEW
CHEWABLE_TABLET | ORAL | Status: AC
  Filled 2022-05-21: qty 1

## 2022-05-21 MED ORDER — SODIUM CHLORIDE 0.9 % IV SOLN: INTRAVENOUS | Status: DC

## 2022-05-21 MED ORDER — SODIUM CHLORIDE 0.9 % IV SOLN: INTRAVENOUS | Status: DC | PRN

## 2022-05-21 MED ORDER — ACETAMINOPHEN 160 MG/5ML PO SOLN: 650.0000 mg | ORAL | Status: DC | PRN

## 2022-05-21 MED ORDER — PANTOPRAZOLE SODIUM 40 MG PO TBEC
40.0000 mg | DELAYED_RELEASE_TABLET | Freq: Every day | ORAL | Status: DC
  Administered 2022-05-21 – 2022-05-23 (×3): 40 mg via ORAL
  Filled 2022-05-21 (×3): qty 1

## 2022-05-21 MED ORDER — TICAGRELOR 90 MG PO TABS
ORAL_TABLET | ORAL | Status: AC
  Filled 2022-05-21: qty 2

## 2022-05-21 MED ORDER — LIDOCAINE HCL (CARDIAC) PF 100 MG/5ML IV SOSY
PREFILLED_SYRINGE | INTRAVENOUS | Status: DC | PRN
  Administered 2022-05-21: 60 mg via INTRAVENOUS

## 2022-05-21 MED ORDER — CLOPIDOGREL BISULFATE 300 MG PO TABS
ORAL_TABLET | ORAL | Status: AC
  Filled 2022-05-21: qty 1

## 2022-05-21 MED ORDER — ROCURONIUM 10MG/ML (10ML) SYRINGE FOR MEDFUSION PUMP - OPTIME
INTRAVENOUS | Status: DC | PRN
  Administered 2022-05-21: 50 mg via INTRAVENOUS

## 2022-05-21 MED ORDER — LEVOTHYROXINE SODIUM 25 MCG PO TABS
75.0000 ug | ORAL_TABLET | Freq: Every day | ORAL | Status: DC
  Administered 2022-05-22 – 2022-05-23 (×2): 75 ug via ORAL
  Filled 2022-05-21 (×2): qty 3

## 2022-05-21 MED ORDER — ACETAMINOPHEN 650 MG RE SUPP: 650.0000 mg | RECTAL | Status: DC | PRN

## 2022-05-21 MED ORDER — PROPOFOL 10 MG/ML IV BOLUS
INTRAVENOUS | Status: DC | PRN
  Administered 2022-05-21: 100 mg via INTRAVENOUS

## 2022-05-21 MED ORDER — SUGAMMADEX SODIUM 200 MG/2ML IV SOLN
INTRAVENOUS | Status: DC | PRN
  Administered 2022-05-21: 250 mg via INTRAVENOUS

## 2022-05-21 MED ORDER — CLEVIDIPINE BUTYRATE 0.5 MG/ML IV EMUL
INTRAVENOUS | Status: AC
  Filled 2022-05-21: qty 50

## 2022-05-21 MED ORDER — CLEVIDIPINE BUTYRATE 0.5 MG/ML IV EMUL
0.0000 mg/h | INTRAVENOUS | Status: DC
  Administered 2022-05-21: 6 mg/h via INTRAVENOUS
  Administered 2022-05-21: 7 mg/h via INTRAVENOUS
  Administered 2022-05-21: 8 mg/h via INTRAVENOUS
  Filled 2022-05-21 (×4): qty 50

## 2022-05-21 MED ORDER — EPTIFIBATIDE 20 MG/10ML IV SOLN
INTRAVENOUS | Status: AC
  Filled 2022-05-21: qty 10

## 2022-05-21 MED ORDER — LABETALOL HCL 5 MG/ML IV SOLN: 10.0000 mg | Freq: Once | INTRAVENOUS | Status: DC | PRN

## 2022-05-21 MED ORDER — NITROGLYCERIN 1 MG/10 ML FOR IR/CATH LAB
INTRA_ARTERIAL | Status: AC
  Filled 2022-05-21: qty 10

## 2022-05-21 MED ORDER — NICARDIPINE HCL IN NACL 20-0.86 MG/200ML-% IV SOLN
0.0000 mg/h | INTRAVENOUS | Status: DC | PRN
  Filled 2022-05-21: qty 200

## 2022-05-21 MED ORDER — NITROGLYCERIN 1 MG/10 ML FOR IR/CATH LAB
INTRA_ARTERIAL | Status: AC | PRN
  Administered 2022-05-21: 25 ug via INTRA_ARTERIAL

## 2022-05-21 MED ORDER — IOHEXOL 300 MG/ML  SOLN
150.0000 mL | Freq: Once | INTRAMUSCULAR | Status: AC | PRN
  Administered 2022-05-21: 45 mL via INTRA_ARTERIAL

## 2022-05-21 MED ORDER — CANGRELOR TETRASODIUM 50 MG IV SOLR
INTRAVENOUS | Status: AC
  Filled 2022-05-21: qty 50

## 2022-05-21 MED ORDER — EZETIMIBE 10 MG PO TABS
10.0000 mg | ORAL_TABLET | Freq: Every day | ORAL | Status: DC
  Administered 2022-05-21 – 2022-05-23 (×3): 10 mg via ORAL
  Filled 2022-05-21 (×3): qty 1

## 2022-05-21 MED ORDER — ATORVASTATIN CALCIUM 40 MG PO TABS
80.0000 mg | ORAL_TABLET | Freq: Every day | ORAL | Status: DC
  Administered 2022-05-21 – 2022-05-23 (×3): 80 mg via ORAL
  Filled 2022-05-21 (×3): qty 2

## 2022-05-21 MED ORDER — IOHEXOL 300 MG/ML  SOLN
50.0000 mL | Freq: Once | INTRAMUSCULAR | Status: AC | PRN
  Administered 2022-05-21: 40 mL via INTRA_ARTERIAL

## 2022-05-21 MED ORDER — ACETAMINOPHEN 325 MG PO TABS: 650.0000 mg | ORAL_TABLET | ORAL | Status: DC | PRN

## 2022-05-21 MED ORDER — ORAL CARE MOUTH RINSE: 15.0000 mL | OROMUCOSAL | Status: DC | PRN

## 2022-05-21 MED ORDER — LABETALOL HCL 5 MG/ML IV SOLN
INTRAVENOUS | Status: DC | PRN
  Administered 2022-05-21: 20 mg via INTRAVENOUS

## 2022-05-21 MED ORDER — TIROFIBAN HCL IN NACL 5-0.9 MG/100ML-% IV SOLN
INTRAVENOUS | Status: AC
  Filled 2022-05-21: qty 100

## 2022-05-21 MED ORDER — FENTANYL CITRATE (PF) 100 MCG/2ML IJ SOLN
INTRAMUSCULAR | Status: DC | PRN
  Administered 2022-05-21: 100 ug via INTRAVENOUS

## 2022-05-21 MED ORDER — VERAPAMIL HCL 2.5 MG/ML IV SOLN
INTRAVENOUS | Status: AC
  Filled 2022-05-21: qty 2

## 2022-05-21 MED ORDER — ORAL CARE MOUTH RINSE
15.0000 mL | OROMUCOSAL | Status: DC
  Administered 2022-05-21 – 2022-05-23 (×7): 15 mL via OROMUCOSAL

## 2022-05-21 MED ORDER — PANTOPRAZOLE SODIUM 40 MG IV SOLR
40.0000 mg | Freq: Every day | INTRAVENOUS | Status: DC
  Administered 2022-05-21: 40 mg via INTRAVENOUS
  Filled 2022-05-21: qty 10

## 2022-05-21 MED ORDER — CEFAZOLIN SODIUM-DEXTROSE 2-4 GM/100ML-% IV SOLN
INTRAVENOUS | Status: AC
  Filled 2022-05-21: qty 100

## 2022-05-21 MED ORDER — ONDANSETRON HCL 4 MG/2ML IJ SOLN
INTRAMUSCULAR | Status: DC | PRN
  Administered 2022-05-21: 4 mg via INTRAVENOUS

## 2022-05-21 MED ORDER — CHLORHEXIDINE GLUCONATE CLOTH 2 % EX PADS
6.0000 | MEDICATED_PAD | Freq: Every day | CUTANEOUS | Status: DC
  Administered 2022-05-21 – 2022-05-23 (×4): 6 via TOPICAL

## 2022-05-21 MED ORDER — CEFAZOLIN SODIUM-DEXTROSE 2-3 GM-%(50ML) IV SOLR
INTRAVENOUS | Status: DC | PRN
  Administered 2022-05-21: 2 g via INTRAVENOUS

## 2022-05-21 MED ORDER — CLEVIDIPINE BUTYRATE 0.5 MG/ML IV EMUL
INTRAVENOUS | Status: DC | PRN
  Administered 2022-05-21: 2 mg/h via INTRAVENOUS

## 2022-05-21 MED ORDER — STROKE: EARLY STAGES OF RECOVERY BOOK
Freq: Once | Status: AC
  Filled 2022-05-21: qty 1

## 2022-05-21 MED ORDER — PERFLUTREN LIPID MICROSPHERE
1.0000 mL | INTRAVENOUS | Status: AC | PRN
  Administered 2022-05-21: 5 mL via INTRAVENOUS

## 2022-05-21 NOTE — Progress Notes (Signed)
Pt arrives from IR with the following pt belongings:  - 1 pair of gray underwear

## 2022-05-21 NOTE — TOC Progression Note (Signed)
Transition of Care [facility]) - Progression Note    Patient Details  Name: Alan Stewart MRN: [id number] Date of Birth: Feb 15, 1946  Transition of Care [facility]) CM/SW [place], RN Phone [phone]  05/21/2022, 4:26 PM  Clinical Narrative:     Transition of Care [facility]) Screening Note   Patient Details  Name: Alan Stewart Date of Birth: Aug 11, 1946   Transition of Care [facility]) CM/SW Contact:    Angelita Ingles, RN Phone Number: 05/21/2022, 4:26 PM    Transition of Care Department [facility]) has reviewed patient and no TOC needs have been identified at this time. We will continue to monitor patient advancement through interdisciplinary progression rounds.           Expected Discharge Plan and Services                                                 Social Determinants of Health (SDOH) Interventions    Readmission Risk Interventions     No data to display

## 2022-05-21 NOTE — Sedation Documentation (Signed)
Patient transported to 38M room 13 with CRNA. Report given to receiving RN at bedside. Right groin soft, dressing (clean, dry, intact). Distal pulses palpable. Patient belonging bag transported with patient to room. Family to be taken to [facility] ICU waiting room.

## 2022-05-21 NOTE — Progress Notes (Signed)
  Echocardiogram 2D Echocardiogram has been performed.  Johny Chess 05/21/2022, 10:14 AM

## 2022-05-21 NOTE — Addendum Note (Signed)
Addendum  created 05/21/22 1441 by Moshe Salisbury, CRNA   Intraprocedure Event edited

## 2022-05-21 NOTE — Progress Notes (Signed)
OT Cancellation Note  Patient Details Name: BENNO BRENSINGER MRN: [id number] DOB: Nov 30, 1945   Cancelled Treatment:    Reason Eval/Treat Not Completed: Active bedrest order Strict bedrest orders noted - will hold OT eval until bedrest order discontinued and cleared by medical team for OOB activities.  Layla Maw 05/21/2022, 11:10 AM

## 2022-05-21 NOTE — Anesthesia Procedure Notes (Signed)
Procedure Name: Intubation Date/Time: 05/21/2022 12:16 AM  Performed by: Valetta Fuller, CRNAPre-anesthesia Checklist: Patient identified, Emergency Drugs available, Suction available and Patient being monitored Patient Re-evaluated:Patient Re-evaluated prior to induction Oxygen Delivery Method: Circle system utilized Preoxygenation: Pre-oxygenation with 100% oxygen Induction Type: IV induction, Rapid sequence and Cricoid Pressure applied Laryngoscope Size: Glidescope Grade View: Grade I Tube size: 7.5 mm Number of attempts: 2 Airway Equipment and Method: Stylet Placement Confirmation: ETT inserted through vocal cords under direct vision, positive ETCO2 and breath sounds checked- equal and bilateral Secured at: 24 cm Tube secured with: Tape Dental Injury: Teeth and Oropharynx as per pre-operative assessment  Comments: Attempted DL with Miller 2, unable to visualize cords. Mask ventilated without difficulty. Glidescope #4 used for grade 1 view and intubation

## 2022-05-21 NOTE — Sedation Documentation (Signed)
Right femoral sheath removed. 89f angioseal closure device deployed to right groin

## 2022-05-21 NOTE — Anesthesia Postprocedure Evaluation (Signed)
Anesthesia Post Note  Patient: Alan Stewart  Procedure(s) Performed: IR WITH ANESTHESIA     Patient location during evaluation: ICU Anesthesia Type: General Level of consciousness: awake Pain management: pain level controlled Vital Signs Assessment: post-procedure vital signs reviewed and stable Respiratory status: spontaneous breathing, nonlabored ventilation, respiratory function stable and patient connected to nasal cannula oxygen Cardiovascular status: blood pressure returned to baseline and stable Postop Assessment: no apparent nausea or vomiting Anesthetic complications: no   No notable events documented.  Last Vitals:  Vitals:   05/21/22 0645 05/21/22 0700  BP:  (!) 115/58  Pulse: 62 60  Resp: 15 16  Temp:    SpO2: 97% 95%    Last Pain:  Vitals:   05/21/22 0636  TempSrc: Axillary                 Ejay Lashley P Bucky Grigg

## 2022-05-21 NOTE — Transfer of Care (Signed)
Immediate Anesthesia Transfer of Care Note  Patient: Alan Stewart  Procedure(s) Performed: IR WITH ANESTHESIA  Patient Location: ICU  Anesthesia Type:General  Level of Consciousness: sedated  Airway & Oxygen Therapy: Patient connected to face mask oxygen  Post-op Assessment: Report given to RN and Post -op Vital signs reviewed and stable  Post vital signs: Reviewed and stable  Last Vitals:  Vitals Value Taken Time  BP 180/65   Temp    Pulse 58 05/21/22 0151  Resp 28 05/21/22 0151  SpO2 96 % 05/21/22 0151  Vitals shown include unvalidated device data.  Last Pain: There were no vitals filed for this visit.       Complications: No notable events documented.

## 2022-05-21 NOTE — Anesthesia Procedure Notes (Signed)
Arterial Line Insertion Start/End10/10/2021 12:20 AM, 05/21/2022 12:30 AM Performed by: Murvin Natal, MD, anesthesiologist  Patient location: OOR procedure area. Preanesthetic checklist: patient identified, IV checked, site marked, risks and benefits discussed, surgical consent, monitors and equipment checked, pre-op evaluation, timeout performed and anesthesia consent Left, radial was placed Catheter size: 20 G Hand hygiene performed , maximum sterile barriers used  and Seldinger technique used  Attempts: 1 Procedure performed without using ultrasound guided technique. Following insertion, dressing applied and Biopatch. Post procedure assessment: normal and unchanged  Patient tolerated the procedure well with no immediate complications.

## 2022-05-21 NOTE — Progress Notes (Signed)
Bilateral lower ext venous  has been completed. Refer to [facility] under chart review to view preliminary results.   05/21/2022  9:49 AM Kolleen Ochsner, Bonnye Fava

## 2022-05-21 NOTE — Progress Notes (Signed)
STROKE TEAM PROGRESS NOTE   SUBJECTIVE (INTERVAL HISTORY) His wife and RN are at the bedside. Per wife, pt doing better, able to follow commands and able to make sound out. Right side weakness much improved. Still on cleviprex for BP goal 120-140. Will d/c A line and BP go by cuff pressure, taper off cleviprex as able. Pending MRI and MRA this pm.   OBJECTIVE Temp:  [96.2 F (35.7 C)-98.1 F (36.7 C)] 98.1 F (36.7 C) (10/03 1600) Pulse Rate:  [47-70] 59 (10/03 1600) Cardiac Rhythm: Normal sinus rhythm (10/03 1017) Resp:  [12-26] 20 (10/03 1600) BP: (90-177)/(47-127) 118/61 (10/03 1600) SpO2:  [95 %-100 %] 96 % (10/03 1600) Arterial Line BP: (116-180)/(46-68) 164/63 (10/03 1600) Weight:  [125.6 kg] 125.6 kg (10/02 2315)  Recent Labs  Lab 05/21/22 0149 05/21/22 0342 05/21/22 0816 05/21/22 1158 05/21/22 1603  GLUCAP 179* 184* 140* 115* 119*   Recent Labs  Lab 05/20/22 2315 05/20/22 2319 05/21/22 0433  NA 142 141 138  K 4.2 4.0 4.0  CL 108 105 110  CO2 22  --  22  GLUCOSE 117* 109* 195*  BUN '17 19 15  '$ CREATININE 1.20 1.20 1.08  CALCIUM 9.3  --  8.3*   Recent Labs  Lab 05/20/22 2315  AST 29  ALT 30  ALKPHOS 105  BILITOT 0.6  PROT 6.1*  ALBUMIN 3.3*   Recent Labs  Lab 05/20/22 2315 05/20/22 2319 05/21/22 0433  WBC 6.8  --  7.2  NEUTROABS 3.6  --  5.9  HGB 14.3 14.6 13.4  HCT 43.7 43.0 39.8  MCV 90.3  --  90.0  PLT 243  --  207   No results for input(s): "CKTOTAL", "CKMB", "CKMBINDEX", "TROPONINI" in the last 168 hours. Recent Labs    05/20/22 2315  LABPROT 12.9  INR 1.0   No results for input(s): "COLORURINE", "LABSPEC", "PHURINE", "GLUCOSEU", "HGBUR", "BILIRUBINUR", "KETONESUR", "PROTEINUR", "UROBILINOGEN", "NITRITE", "LEUKOCYTESUR" in the last 72 hours.  Invalid input(s): "APPERANCEUR"     Component Value Date/Time   CHOL 106 05/21/2022 0433   TRIG 83 05/21/2022 0433   HDL 33 (L) 05/21/2022 0433   CHOLHDL 3.2 05/21/2022 0433   VLDL 17  05/21/2022 0433   LDLCALC 56 05/21/2022 0433   Lab Results  Component Value Date   HGBA1C 6.0 (H) 05/21/2022      Component Value Date/Time   LABOPIA NONE DETECTED 05/21/2022 0910   COCAINSCRNUR NONE DETECTED 05/21/2022 0910   LABBENZ NONE DETECTED 05/21/2022 0910   AMPHETMU NONE DETECTED 05/21/2022 0910   THCU NONE DETECTED 05/21/2022 0910   LABBARB NONE DETECTED 05/21/2022 0910    Recent Labs  Lab 05/20/22 2315  ETH <10    I have personally reviewed the radiological images below and agree with the radiology interpretations.  VAS [place] LOWER EXTREMITY VENOUS (DVT)  Result Date: 05/21/2022  Lower Venous DVT Study Patient Name:  Alan Stewart  Date of Exam:   05/21/2022 Medical Rec #: [id number]       Accession #:    [id number] Date of Birth: [birth year]        Patient Gender: M Patient Age:   76 years Exam Location:  [facility] Procedure:      VAS [place] LOWER EXTREMITY VENOUS (DVT) Referring Phys: Cornelius Moras Kimara Bencomo --------------------------------------------------------------------------------  Indications: Embolic stroke, and Edema.  Comparison Study: No priors Performing Technologist: Velva Harman Sturdivant RDMS, RVT  Examination Guidelines: A complete evaluation includes B-mode imaging, spectral Doppler, color Doppler, and power Doppler as  needed of all accessible portions of each vessel. Bilateral testing is considered an integral part of a complete examination. Limited examinations for reoccurring indications may be performed as noted. The reflux portion of the exam is performed with the patient in reverse Trendelenburg.  +---------+---------------+---------+-----------+----------+--------------+ RIGHT    CompressibilityPhasicitySpontaneityPropertiesThrombus Aging +---------+---------------+---------+-----------+----------+--------------+ CFV      Full           No       Yes                                 +---------+---------------+---------+-----------+----------+--------------+ SFJ       Full                                                        +---------+---------------+---------+-----------+----------+--------------+ FV Prox  Full                                                        +---------+---------------+---------+-----------+----------+--------------+ FV Mid   Full                                                        +---------+---------------+---------+-----------+----------+--------------+ FV DistalFull                                                        +---------+---------------+---------+-----------+----------+--------------+ PFV      Full                                                        +---------+---------------+---------+-----------+----------+--------------+ POP      Full           Yes      Yes                                 +---------+---------------+---------+-----------+----------+--------------+ PTV      Full                                                        +---------+---------------+---------+-----------+----------+--------------+ PERO     Full                                                        +---------+---------------+---------+-----------+----------+--------------+   +---------+---------------+---------+-----------+----------+--------------+  LEFT     CompressibilityPhasicitySpontaneityPropertiesThrombus Aging +---------+---------------+---------+-----------+----------+--------------+ CFV      Full           Yes      Yes                                 +---------+---------------+---------+-----------+----------+--------------+ SFJ      Full                                                        +---------+---------------+---------+-----------+----------+--------------+ FV Prox  Full                                                        +---------+---------------+---------+-----------+----------+--------------+ FV Mid   Full                                                         +---------+---------------+---------+-----------+----------+--------------+ FV DistalFull                                                        +---------+---------------+---------+-----------+----------+--------------+ PFV      Full                                                        +---------+---------------+---------+-----------+----------+--------------+ POP      Full           Yes      Yes                                 +---------+---------------+---------+-----------+----------+--------------+ PTV      Full                                                        +---------+---------------+---------+-----------+----------+--------------+ PERO     Full                                                        +---------+---------------+---------+-----------+----------+--------------+     Summary: BILATERAL: - No evidence of deep vein thrombosis seen in the lower extremities, bilaterally. -No evidence of popliteal cyst, bilaterally.   *See table(s) above for measurements and observations. Electronically signed by Deitra Mayo MD on 05/21/2022 at 5:01:56 PM.  Final    ECHOCARDIOGRAM COMPLETE  Result Date: 05/21/2022    ECHOCARDIOGRAM REPORT   Patient Name:   KYRIAKOS BABLER Date of Exam: 05/21/2022 Medical Rec #:  [id number]      Height:       67.0 in Accession #:    [id number]     Weight:       276.9 lb Date of Birth:  03-Mar-1946       BSA:          2.322 m Patient Age:    63 years       BP:           124/57 mmHg Patient Gender: M              HR:           64 bpm. Exam Location:  Inpatient Procedure: 2D Echo, Cardiac Doppler, Color Doppler and Intracardiac            Opacification Agent Indications:    stroke  History:        Patient has prior history of Echocardiogram examinations, most                 recent 10/21/2021. CAD; Risk Factors:Hypertension, Dyslipidemia                 and Sleep Apnea.  Sonographer:    Johny Chess RDCS Referring  Phys: [place]  Sonographer Comments: Technically difficult study due to poor echo windows. Image acquisition challenging due to patient body habitus and Image acquisition challenging due to respiratory motion. IMPRESSIONS  1. Left ventricular ejection fraction, by estimation, is 60 to 65%. The left ventricle has normal function. The left ventricle has no regional wall motion abnormalities. Left ventricular diastolic parameters are consistent with Grade I diastolic dysfunction (impaired relaxation).  2. Right ventricular systolic function is normal. The right ventricular size is normal. Tricuspid regurgitation signal is inadequate for assessing PA pressure.  3. The mitral valve is normal in structure. No evidence of mitral valve regurgitation. No evidence of mitral stenosis.  4. The aortic valve is tricuspid. Aortic valve regurgitation is not visualized. No aortic stenosis is present.  5. The inferior vena cava is normal in size with greater than 50% respiratory variability, suggesting right atrial pressure of 3 mmHg. FINDINGS  Left Ventricle: Left ventricular ejection fraction, by estimation, is 60 to 65%. The left ventricle has normal function. The left ventricle has no regional wall motion abnormalities. Definity contrast agent was given IV to delineate the left ventricular  endocardial borders. The left ventricular internal cavity size was normal in size. There is no left ventricular hypertrophy. Left ventricular diastolic parameters are consistent with Grade I diastolic dysfunction (impaired relaxation). Normal left ventricular filling pressure. Right Ventricle: The right ventricular size is normal. No increase in right ventricular wall thickness. Right ventricular systolic function is normal. Tricuspid regurgitation signal is inadequate for assessing PA pressure. Left Atrium: Left atrial size was normal in size. Right Atrium: Right atrial size was normal in size. Pericardium: There is no  evidence of pericardial effusion. Presence of epicardial fat layer. Mitral Valve: The mitral valve is normal in structure. No evidence of mitral valve regurgitation. No evidence of mitral valve stenosis. Tricuspid Valve: The tricuspid valve is normal in structure. Tricuspid valve regurgitation is not demonstrated. No evidence of tricuspid stenosis. Aortic Valve: The aortic valve is tricuspid. Aortic valve regurgitation is not visualized. No aortic stenosis is present. Pulmonic Valve:  The pulmonic valve was grossly normal. Pulmonic valve regurgitation is not visualized. No evidence of pulmonic stenosis. Aorta: The aortic root is normal in size and structure. Venous: The inferior vena cava is normal in size with greater than 50% respiratory variability, suggesting right atrial pressure of 3 mmHg. IAS/Shunts: No atrial level shunt detected by color flow Doppler.  LEFT VENTRICLE PLAX 2D LVIDd:         5.60 cm   Diastology LVIDs:         3.20 cm   LV e' medial:    7.29 cm/s LV PW:         1.10 cm   LV E/e' medial:  8.9 LV IVS:        1.00 cm   LV e' lateral:   7.72 cm/s LVOT diam:     2.20 cm   LV E/e' lateral: 8.4 LV SV:         60 LV SV Index:   26 LVOT Area:     3.80 cm  RIGHT VENTRICLE             IVC RV S prime:     16.40 cm/s  IVC diam: 1.50 cm TAPSE (M-mode): 2.1 cm LEFT ATRIUM           Index        RIGHT ATRIUM           Index LA diam:      3.40 cm 1.46 cm/m   RA Area:     13.00 cm LA Vol (A4C): 49.0 ml 21.10 ml/m  RA Volume:   30.30 ml  13.05 ml/m  AORTIC VALVE LVOT Vmax:   77.70 cm/s LVOT Vmean:  49.100 cm/s LVOT VTI:    0.157 m  AORTA Ao Root diam: 3.40 cm MITRAL VALVE MV Area (PHT): 3.37 cm    SHUNTS MV Decel Time: 225 msec    Systemic VTI:  0.16 m MV E velocity: 64.60 cm/s  Systemic Diam: 2.20 cm MV A velocity: 74.70 cm/s MV E/A ratio:  0.86 Mihai Croitoru MD Electronically signed by Sanda Klein MD Signature Date/Time: 05/21/2022/10:55:08 AM    Final    CT ANGIO HEAD NECK W WO CM (CODE  STROKE)  Result Date: 05/21/2022 CLINICAL DATA:  Acute neurologic deficit EXAM: CT ANGIOGRAPHY HEAD AND NECK TECHNIQUE: Multidetector CT imaging of the head and neck was performed using the standard protocol during bolus administration of intravenous contrast. Multiplanar CT image reconstructions and MIPs were obtained to evaluate the vascular anatomy. Carotid stenosis measurements (when applicable) are obtained utilizing NASCET criteria, using the distal internal carotid diameter as the denominator. RADIATION DOSE REDUCTION: This exam was performed according to the departmental dose-optimization program which includes automated exposure control, adjustment of the mA and/or kV according to patient size and/or use of iterative reconstruction technique. CONTRAST:  81m OMNIPAQUE IOHEXOL 350 MG/ML SOLN COMPARISON:  None Available. FINDINGS: CTA NECK FINDINGS SKELETON: There is no bony spinal canal stenosis. No lytic or blastic lesion. OTHER NECK: Normal pharynx, larynx and major salivary glands. No cervical lymphadenopathy. Unremarkable thyroid gland. UPPER CHEST: No pneumothorax or pleural effusion. No nodules or masses. AORTIC ARCH: There is calcific atherosclerosis of the aortic arch. There is no aneurysm, dissection or hemodynamically significant stenosis of the visualized portion of the aorta. Conventional 3 vessel aortic branching pattern. The visualized proximal subclavian arteries are widely patent. RIGHT CAROTID SYSTEM: No dissection, occlusion or aneurysm. There is mixed density atherosclerosis extending into the proximal ICA,  resulting in less than 50% stenosis. LEFT CAROTID SYSTEM: No dissection, occlusion or aneurysm. There is mixed density atherosclerosis extending into the proximal ICA, resulting in less than 50% stenosis. VERTEBRAL ARTERIES: Codominant configuration. Both origins are clearly patent. There is no dissection, occlusion or flow-limiting stenosis to the skull base (V1-V3 segments). CTA  HEAD FINDINGS POSTERIOR CIRCULATION: --Vertebral arteries: Normal V4 segments. --Inferior cerebellar arteries: Normal. --Basilar artery: Normal. --Superior cerebellar arteries: Normal. --Posterior cerebral arteries (PCA): Normal. ANTERIOR CIRCULATION: --Intracranial internal carotid arteries: With atherosclerotic calcification within both internal carotid arteries at the skull base. The terminus of the left ICA is occluded. --Anterior cerebral arteries (ACA): Normal. Both A1 segments are present. Patent anterior communicating artery (a-comm). --Middle cerebral arteries (MCA): There is occlusion of the left MCA with minimal distal collateralization. The right MCA is normal. VENOUS SINUSES: As permitted by contrast timing, patent. ANATOMIC VARIANTS: None Review of the MIP images confirms the above findings. IMPRESSION: 1. 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 2. Bilateral carotid bifurcation atherosclerosis without hemodynamically significant stenosis. Critical Value/emergent results were called by telephone at the time of interpretation on 05/21/2022 at 12:00 am to provider [facility] , who verbally acknowledged these results. Aortic atherosclerosis (ICD10-I70.0). Electronically Signed   By: Ulyses Jarred M.D.   On: 05/21/2022 00:02   CT HEAD CODE STROKE WO CONTRAST  Result Date: 05/20/2022 CLINICAL DATA:  Code stroke.  Acute neurologic deficit EXAM: CT HEAD WITHOUT CONTRAST TECHNIQUE: Contiguous axial images were obtained from the base of the skull through the vertex without intravenous contrast. RADIATION DOSE REDUCTION: This exam was performed according to the departmental dose-optimization program which includes automated exposure control, adjustment of the mA and/or kV according to patient size and/or use of iterative reconstruction technique. COMPARISON:  None Available. FINDINGS: Brain: There is no mass, hemorrhage or extra-axial  collection. The size and configuration of the ventricles and extra-axial CSF spaces are normal. The brain parenchyma is normal, without evidence of acute or chronic infarction. Cortical calcification left paracentral. Vascular: Atherosclerotic calcification of the internal carotid arteries at the skull base. No abnormal hyperdensity of the major intracranial arteries or dural venous sinuses. Skull: The visualized skull base, calvarium and extracranial soft tissues are normal. Sinuses/Orbits: No fluid levels or advanced mucosal thickening of the visualized paranasal sinuses. No mastoid or middle ear effusion. The orbits are normal. ASPECTS [facility] Stroke Program Early CT Score) - Ganglionic level infarction (caudate, lentiform nuclei, internal capsule, insula, M1-M3 cortex): 7 - Supraganglionic infarction (M4-M6 cortex): 3 Total score (0-10 with 10 being normal): 10 IMPRESSION: 1. No acute intracranial abnormality. 2. ASPECTS is 10 These results were communicated to Dr. Roland Rack at 11:33 pm on 05/20/2022 by text page via the [facility] messaging system. Electronically Signed   By: Ulyses Jarred M.D.   On: 05/20/2022 23:34     PHYSICAL EXAM  Temp:  [96.2 F (35.7 C)-98.1 F (36.7 C)] 98.1 F (36.7 C) (10/03 1600) Pulse Rate:  [47-70] 59 (10/03 1600) Resp:  [12-26] 20 (10/03 1600) BP: (90-177)/(47-127) 118/61 (10/03 1600) SpO2:  [95 %-100 %] 96 % (10/03 1600) Arterial Line BP: (116-180)/(46-68) 164/63 (10/03 1600) Weight:  [125.6 kg] 125.6 kg (10/02 2315)  General - Well nourished, well developed, in no apparent distress.  Ophthalmologic - fundi not visualized due to noncooperation.  Cardiovascular - Regular rhythm and rate.  Neuro - awake, alert, eyes open, expressive aphasia, able to say "yes" and "no", not  able to answer all patient questions.  Will follow most simple commands, however not two-step commands.  Not able to name or repeat. No gaze palsy, tracking bilaterally, visual field  full.  Mild left facial droop. Tongue midline. RUE 3/5 and drift but not hitting bed. BLE 4/5. Sensation, coordination not corporative and gait not tested.   ASSESSMENT/PLAN Mr. SIDNEY KANN is a 76 y.o. male with history of hypertension, hyperlipidemia, CAD status post stent, OSA, obesity admitted for right-sided weakness and aphasia.  Cannot say given.  Status post IR for left terminal ICA and A1 and M1 occlusion.  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 Per Dr. Estanislado Pandy, clot felt to be cardioembolic source instead of ICAD MRI pending MRA pending 2D Echo EF 60 to 65% LE venous Doppler no DVT Recommend loop recorder on discharge to rule out A-fib LDL 56 HgbA1c 6.0 UDS negative SCDs VTE prophylaxis aspirin 81 mg daily and Brilinta (ticagrelor) 90 mg bid prior to admission, now on No antithrombotic within 24 hours of TNK Ongoing aggressive stroke risk factor management Therapy recommendations: Pending Disposition: Pending  Hypertension Home meds Coreg Stable Off Cleviprex BP goal 1 20-1 40 within 24 hours of IR Long term BP goal normotensive  Hyperlipidemia Home meds: Lipitor 80 and Zetia LDL 56, goal < 70 Now on Lipitor 80 and Zetia Continue statin at discharge  Other Stroke Risk Factors Advanced age Obesity, Body mass index is 43.37 kg/m.  Coronary artery disease status post stent Obstructive sleep apnea  Other Active Problems   Hospital day # 0  This patient is critically ill due to stroke, left M1 occlusion, status post TNK and thrombectomy and at significant risk of neurological worsening, death form recurrent stroke, hemorrhagic transformation, bleeding from TNK. This patient's care  requires constant monitoring of vital signs, hemodynamics, respiratory and cardiac monitoring, review of multiple databases, neurological assessment, discussion with family, other specialists and medical decision making of high complexity. I spent 40 minutes of neurocritical care time in the care of this patient. I had long discussion with wife at bedside, updated pt current condition, treatment plan and potential prognosis, and answered all the questions.  She expressed understanding and appreciation.    Rosalin Hawking, MD PhD Stroke Neurology 05/21/2022 5:30 PM    To contact Stroke Continuity provider, please refer to [email or web address] After hours, contact General Neurology

## 2022-05-21 NOTE — Progress Notes (Signed)
Referring Physician(s): Code Stroke  Chief Complaint: The patient is seen in follow up today s/p complete revascularization of the left internal carotid artery terminal occlusion involving the ACA A1, and MCA with 1 pass with a 4 mm x 40 mm Solitaire retrieval device,and contact aspiration achieving a TICI 3 revascularization Dr. Luanne Bras on 10.3.23.   History of present illness: 76 y.o. male inpatient. History of sleep apnea, morbid obesity, HTN, HLD,  CAD s/p cardiac cath with PCI. Presented to the ED with right sided weakness and aphasia. Code Stroke was called and the patient was found to have a  left common carotid occlusion   Past Medical History:  Diagnosis Date   CAD (coronary artery disease)    a. 10/2021 NSTEMI/PCI: LM nl, LAD min irregs, LCX 66m(2.5x18 Onyx Frontier DES), OM1 nl, RCA 940m3.5x26 Onyx Frontier DES), 30d.   Diastolic dysfunction    a. 10/2021 Echo: EF 50-55%, no rwma, Gr1 DD, nl RV fxn. No significant valvular dzs.   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   left dorsal forearm, EDC   Sleep apnea     Past Surgical History:  Procedure Laterality Date   COLONOSCOPY N/A 10/09/2015   Procedure: COLONOSCOPY;  Surgeon: PaHulen LusterMD;  [facility];  Service: Gastroenterology;  Laterality: N/A;   CORONARY STENT INTERVENTION N/A 10/22/2021   Procedure: CORONARY STENT INTERVENTION;  Surgeon: ArWellington HampshireMD;  [facility];  Service: Cardiovascular;  Laterality: N/A;   LEFT HEART CATH AND CORONARY ANGIOGRAPHY N/A 10/22/2021   Procedure: LEFT HEART CATH AND CORONARY ANGIOGRAPHY;  Surgeon: ArWellington HampshireMD;  [facility];  Service: Cardiovascular;  Laterality: N/A;   ROTATOR CUFF REPAIR      Allergies: Patient has no known allergies.  Medications: Prior to Admission medications   Medication Sig Start  Date End Date Taking? Authorizing Provider  aspirin EC 81 MG EC tablet Take 1 tablet (81 mg total) by mouth daily. Swallow whole. 10/22/21  Yes GiDwyane DeeMD  atorvastatin (LIPITOR) 80 MG tablet Take 1 tablet (80 mg total) by mouth daily. 10/23/21  Yes GiDwyane DeeMD  carvedilol (COREG) 6.25 MG tablet Take 1 tablet (6.25 mg total) by mouth 2 (two) times daily with a meal. 03/21/22  Yes ArWellington HampshireMD  ezetimibe (ZETIA) 10 MG tablet Take 1 tablet (10 mg total) by mouth daily. 12/18/21 05/22/23 Yes BeTheora GianottiNP  levothyroxine (SYNTHROID) 75 MCG tablet Take 75 mcg by mouth daily before breakfast. 11/01/21 11/01/22 Yes [provider]  ticagrelor (BRILINTA) 90 MG TABS tablet Take 1 tablet (90 mg total) by mouth 2 (two) times daily. 03/21/22  Yes ArWellington HampshireMD  nitroGLYCERIN (NITROSTAT) 0.4 MG SL tablet Place 1 tablet (0.4 mg total) under the tongue every 5 (five) minutes as needed for chest pain. Patient not taking: Reported on 05/21/2022 10/22/21   GiDwyane DeeMD     No family history on file.  Social History   Socioeconomic History   Marital status: Married    Spouse name: Not on file   Number of children: Not on file   Years of education: Not on file   Highest education level: Not on file  Occupational History   Not on file  Tobacco Use   Smoking status: Former  Packs/day: 1.50    Years: 10.00    Total pack years: 15.00    Types: Cigarettes    Quit date: 07/19/1990    Years since quitting: 31.8   Smokeless tobacco: Former  Scientific laboratory technician Use: Never used  Substance and Sexual Activity   Alcohol use: Yes   Drug use: No   Sexual activity: Not on file  Other Topics Concern   Not on file  Social History Narrative   Not on file   Social Determinants of Health   Financial Resource Strain: Not on file  Food Insecurity: Not on file  Transportation Needs: Not on file  Physical Activity: Not on file  Stress: Not on file  Social  Connections: Not on file     Vital Signs: BP (!) 115/58   Pulse 60   Temp 97.9 F (36.6 C) (Axillary)   Resp 16   Ht '5\' 7"'$  (1.702 m)   Wt 276 lb 14.4 oz (125.6 kg)   SpO2 95%   BMI 43.37 kg/m   Physical Exam Vitals and nursing note reviewed.  Constitutional:      Appearance: He is well-developed.  HENT:     Head: Normocephalic.  Eyes:     Pupils: Pupils are equal, round, and reactive to light.  Cardiovascular:     Rate and Rhythm: Normal rate and regular rhythm.  Pulmonary:     Effort: Pulmonary effort is normal.  Musculoskeletal:        General: Normal range of motion.     Cervical back: Normal range of motion.  Skin:    General: Skin is warm and dry.  Neurological:     Mental Status: He is alert.     Motor: Weakness (right upper extremity 3+4/5, Right lower extremity 4/5) present.     Comments: Expressive aphasia noted. Slight right sided facial droop     Imaging: CT ANGIO HEAD NECK W WO CM (CODE STROKE)  Result Date: 05/21/2022 CLINICAL DATA:  Acute neurologic deficit EXAM: CT ANGIOGRAPHY HEAD AND NECK TECHNIQUE: Multidetector CT imaging of the head and neck was performed using the standard protocol during bolus administration of intravenous contrast. Multiplanar CT image reconstructions and MIPs were obtained to evaluate the vascular anatomy. Carotid stenosis measurements (when applicable) are obtained utilizing NASCET criteria, using the distal internal carotid diameter as the denominator. RADIATION DOSE REDUCTION: This exam was performed according to the departmental dose-optimization program which includes automated exposure control, adjustment of the mA and/or kV according to patient size and/or use of iterative reconstruction technique. CONTRAST:  48m OMNIPAQUE IOHEXOL 350 MG/ML SOLN COMPARISON:  None Available. FINDINGS: CTA NECK FINDINGS SKELETON: There is no bony spinal canal stenosis. No lytic or blastic lesion. OTHER NECK: Normal pharynx, larynx and major  salivary glands. No cervical lymphadenopathy. Unremarkable thyroid gland. UPPER CHEST: No pneumothorax or pleural effusion. No nodules or masses. AORTIC ARCH: There is calcific atherosclerosis of the aortic arch. There is no aneurysm, dissection or hemodynamically significant stenosis of the visualized portion of the aorta. Conventional 3 vessel aortic branching pattern. The visualized proximal subclavian arteries are widely patent. RIGHT CAROTID SYSTEM: No dissection, occlusion or aneurysm. There is mixed density atherosclerosis extending into the proximal ICA, resulting in less than 50% stenosis. LEFT CAROTID SYSTEM: No dissection, occlusion or aneurysm. There is mixed density atherosclerosis extending into the proximal ICA, resulting in less than 50% stenosis. VERTEBRAL ARTERIES: Codominant configuration. Both origins are clearly patent. There is no dissection, occlusion or flow-limiting stenosis  to the skull base (V1-V3 segments). CTA HEAD FINDINGS POSTERIOR CIRCULATION: --Vertebral arteries: Normal V4 segments. --Inferior cerebellar arteries: Normal. --Basilar artery: Normal. --Superior cerebellar arteries: Normal. --Posterior cerebral arteries (PCA): Normal. ANTERIOR CIRCULATION: --Intracranial internal carotid arteries: With atherosclerotic calcification within both internal carotid arteries at the skull base. The terminus of the left ICA is occluded. --Anterior cerebral arteries (ACA): Normal. Both A1 segments are present. Patent anterior communicating artery (a-comm). --Middle cerebral arteries (MCA): There is occlusion of the left MCA with minimal distal collateralization. The right MCA is normal. VENOUS SINUSES: As permitted by contrast timing, patent. ANATOMIC VARIANTS: None Review of the MIP images confirms the above findings. IMPRESSION: 1. 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 2. Bilateral carotid bifurcation atherosclerosis without  hemodynamically significant stenosis. Critical Value/emergent results were called by telephone at the time of interpretation on 05/21/2022 at 12:00 am to provider [facility] , who verbally acknowledged these results. Aortic atherosclerosis (ICD10-I70.0). Electronically Signed   By: Ulyses Jarred M.D.   On: 05/21/2022 00:02   CT HEAD CODE STROKE WO CONTRAST  Result Date: 05/20/2022 CLINICAL DATA:  Code stroke.  Acute neurologic deficit EXAM: CT HEAD WITHOUT CONTRAST TECHNIQUE: Contiguous axial images were obtained from the base of the skull through the vertex without intravenous contrast. RADIATION DOSE REDUCTION: This exam was performed according to the departmental dose-optimization program which includes automated exposure control, adjustment of the mA and/or kV according to patient size and/or use of iterative reconstruction technique. COMPARISON:  None Available. FINDINGS: Brain: There is no mass, hemorrhage or extra-axial collection. The size and configuration of the ventricles and extra-axial CSF spaces are normal. The brain parenchyma is normal, without evidence of acute or chronic infarction. Cortical calcification left paracentral. Vascular: Atherosclerotic calcification of the internal carotid arteries at the skull base. No abnormal hyperdensity of the major intracranial arteries or dural venous sinuses. Skull: The visualized skull base, calvarium and extracranial soft tissues are normal. Sinuses/Orbits: No fluid levels or advanced mucosal thickening of the visualized paranasal sinuses. No mastoid or middle ear effusion. The orbits are normal. ASPECTS [facility] Stroke Program Early CT Score) - Ganglionic level infarction (caudate, lentiform nuclei, internal capsule, insula, M1-M3 cortex): 7 - Supraganglionic infarction (M4-M6 cortex): 3 Total score (0-10 with 10 being normal): 10 IMPRESSION: 1. No acute intracranial abnormality. 2. ASPECTS is 10 These results were communicated to Dr. Roland Rack at 11:33 pm on 05/20/2022 by text page via the [facility] messaging system. Electronically Signed   By: Ulyses Jarred M.D.   On: 05/20/2022 23:34    Labs:  CBC: Recent Labs    10/21/21 0407 10/22/21 0409 05/20/22 2315 05/20/22 2319 05/21/22 0433  WBC 5.2 5.3 6.8  --  7.2  HGB 15.4 14.7 14.3 14.6 13.4  HCT 46.1 44.2 43.7 43.0 39.8  PLT 221 210 243  --  207    COAGS: Recent Labs    10/20/21 1142 05/20/22 2315  INR 1.0 1.0  APTT 27 25    BMP: Recent Labs    10/21/21 0407 10/22/21 0409 05/20/22 2315 05/20/22 2319 05/21/22 0433  NA 134* 138 142 141 138  K 3.7 4.0 4.2 4.0 4.0  CL 103 105 108 105 110  CO2 '25 24 22  '$ --  22  GLUCOSE 140* 118* 117* 109* 195*  BUN '12 17 17 19 15  '$ CALCIUM 8.9 8.8* 9.3  --  8.3*  CREATININE 0.97 1.09 1.20 1.20 1.08  GFRNONAA >60 >60 >60  --  >60    LIVER FUNCTION TESTS: Recent Labs    10/20/21 0931 12/18/21 1101 05/20/22 2315  BILITOT 0.7 1.2 0.6  AST 34 25 29  ALT '19 23 30  '$ ALKPHOS 79 105 105  PROT 7.2 7.1 6.1*  ALBUMIN 3.7 3.5 3.3*    Assessment:  76 y.o. male inpatient. History of sleep apnea, morbid obesity, HTN, HLD,  CAD s/p cardiac cath with PCI. Presented to the ED with right sided weakness and aphasia. Code Stroke was called and the patient was found to have a  left common carotid occlusion s/p complete revascularization of the left internal carotid artery terminal occlusion involving the ACA A1, and MCA with 1 pass with a 4 mm x 40 mm Solitaire retrieval device,and contact aspiration achieving a TICI 3 revascularization Dr. Luanne Bras on 10.3.23.  Patient condition is stable remains intubated/sedated, no movements of extremities, Right groin incision stable. Further plans per neurology/CCM/Radiology - appreciate and agree with management NIR to follow.  Patient seen in at bedside post uncomplicated cerebral angiogram intervention  today with .Dr. Luanne Bras  Appears to have expressive aphasia  unable to perform ROS.  Alert. Able to follow commands. Can spontaneously move all 4 extremities. Right sided weakness improving. Right upper extremity 3+4/5. Right lower extremity 4/5. PERRLA bilaterally, Slight right sided facial droop noted with nasolabial fold slightly flattened on the right side. No facial droop noted.    Right CFA puncture site clean, dry, dressed appropriately, no active bleeding, soft, appropriately tender to palpation.   NIR will continue to follow along - plans per Neurology and CCS.    Signed: Jacqualine Mau, NP 05/21/2022, 8:14 AM   Please refer to Dr. Luanne Bras attestation of this note for management and plan.      \

## 2022-05-21 NOTE — Evaluation (Signed)
Physical Therapy Evaluation Patient Details Name: Alan Stewart MRN: [id number] DOB: 24-Aug-1945 Today's Date: 05/21/2022  History of Present Illness  Patient is a 76 y/o male with PMH of HTN, CAD, HLD, arthritis, SCC, sleep apnea who presented wtih R side weakness and aphasia now s/p IV tenecteplase and IR thrombectomy of L carotid terminal occlusion involving ACA & MCA.  Clinical Impression  Patient presents with decreased mobility due to generalized weakness, imbalance, and decreased safety/deficit awareness.  Currently min A overall for EOB activity.  Previously independent.  Feel he will benefit from skilled PT in the acute setting.  Wife can assist at d/c.  Feel he will progress to home with follow up outpatient PT.         Recommendations for follow up therapy are one component of a multi-disciplinary discharge planning process, led by the attending physician.  Recommendations may be updated based on patient status, additional functional criteria and insurance authorization.  Follow Up Recommendations Outpatient PT      Assistance Recommended at Discharge Intermittent Supervision/Assistance  Patient can return home with the following  A little help with walking and/or transfers;A little help with bathing/dressing/bathroom;Direct supervision/assist for medications management;Help with stairs or ramp for entrance;Assistance with cooking/housework    Equipment Recommendations None recommended by PT  Recommendations for Other Services       Functional Status Assessment Patient has had a recent decline in their functional status and demonstrates the ability to make significant improvements in function in a reasonable and predictable amount of time.     Precautions / Restrictions Precautions Precautions: Fall Precaution Comments: L UE arterial line; SBP 120-140      Mobility  Bed Mobility Overal bed mobility: Needs Assistance Bed Mobility: Supine to Sit, Sit to Supine      Supine to sit: Min assist, HOB elevated Sit to supine: Mod assist   General bed mobility comments: assist to lift trunk to upright, assist for legs into bed to supine    Transfers Overall transfer level: Needs assistance Equipment used: None Transfers: Sit to/from Stand Sit to Stand: Min assist           General transfer comment: lift off from EOB with A for lines and balance    Ambulation/Gait Ambulation/Gait assistance: Min assist Gait Distance (Feet): 2 Feet Assistive device: 1 person hand held assist         General Gait Details: side steps to Basile            Wheelchair Mobility    Modified Rankin (Stroke Patients Only) Modified Rankin (Stroke Patients Only) Pre-Morbid Rankin Score: No symptoms Modified Rankin: Moderately severe disability     Balance Overall balance assessment: Needs assistance   Sitting balance-Leahy Scale: Good     Standing balance support: Single extremity supported Standing balance-Leahy Scale: Poor Standing balance comment: min A static standing for safety                             Pertinent Vitals/Pain Pain Assessment Pain Assessment: No/denies pain    Home Living Family/patient expects to be discharged to:: Private residence Living Arrangements: Spouse/significant other Available Help at Discharge: Family Type of Home: House Home Access: Level entry       Home Layout: One level        Prior Function Prior Level of Function : Independent/Modified Independent  Hand Dominance   Dominant Hand: Right    Extremity/Trunk Assessment   Upper Extremity Assessment Upper Extremity Assessment: Defer to OT evaluation    Lower Extremity Assessment Lower Extremity Assessment: RLE deficits/detail;LLE deficits/detail RLE Deficits / Details: AAROM limited with recent groin access for revascularization; lifts antigravity and knee extension strength 4/5 LLE Deficits /  Details: AAROM WFL, strength hip flexion 3-/5, knee extension 4/5       Communication   Communication: Expressive difficulties  Cognition Arousal/Alertness: Awake/alert Behavior During Therapy: WFL for tasks assessed/performed Overall Cognitive Status: Difficult to assess                                          General Comments General comments (skin integrity, edema, etc.): family in the room and supportive, answering PLOF questions; BP up to 462 systolic on EOB 703 back supine, RN aware    Exercises     Assessment/Plan    PT Assessment Patient needs continued PT services  PT Problem List Decreased strength;Decreased mobility;Decreased cognition;Decreased balance;Decreased activity tolerance       PT Treatment Interventions DME instruction;Therapeutic exercise;Gait training;Balance training;Functional mobility training;Therapeutic activities;Patient/family education    PT Goals (Current goals can be found in the Care Plan section)  Acute Rehab PT Goals Patient Stated Goal: to return to independent PT Goal Formulation: With patient/family Time For Goal Achievement: 06/04/22 Potential to Achieve Goals: Good    Frequency Min 4X/week     Co-evaluation               AM-PAC PT "6 Clicks" Mobility  Outcome Measure Help needed turning from your back to your side while in a flat bed without using bedrails?: A Little Help needed moving from lying on your back to sitting on the side of a flat bed without using bedrails?: A Lot Help needed moving to and from a bed to a chair (including a wheelchair)?: A Little Help needed standing up from a chair using your arms (e.g., wheelchair or bedside chair)?: A Little Help needed to walk in hospital room?: Total Help needed climbing 3-5 steps with a railing? : Total 6 Click Score: 13    End of Session Equipment Utilized During Treatment: Gait belt;Oxygen Activity Tolerance: Treatment limited secondary to medical  complications (Comment) (elevated BP) Patient left: in bed;with call bell/phone within reach Nurse Communication: Mobility status;Other (comment) (BP) PT Visit Diagnosis: Other abnormalities of gait and mobility (R26.89);Other symptoms and signs involving the nervous system (R29.898)    Time: 5009-3818 PT Time Calculation (min) (ACUTE ONLY): 28 min   Charges:   PT Evaluation $PT Eval Moderate Complexity: 1 Mod PT Treatments $Therapeutic Activity: 8-22 mins        Magda Kiel, PT Acute Rehabilitation Services [phone] 05/21/2022   Reginia Naas 05/21/2022, 5:01 PM

## 2022-05-21 NOTE — Evaluation (Signed)
Speech Language Pathology Evaluation Patient Details Name: Alan Stewart MRN: [id number] DOB: 03/19/1946 Today's Date: 05/21/2022 Time: 1010-1035 SLP Time Calculation (min) (ACUTE ONLY): 25 min  Problem List:  Patient Active Problem List   Diagnosis Date Noted   Stroke (cerebrum) ([place]) 05/21/2022   Middle cerebral artery embolism, left 05/21/2022   CAD S/P percutaneous coronary angioplasty 10/22/2021   Prediabetes 10/22/2021   NSTEMI (non-ST elevated myocardial infarction) ([place]) 10/20/2021   Hypertension    Sleep apnea    Hyperlipidemia    Hypothyroidism    Obesity (BMI 30-39.9)    Past Medical History:  Past Medical History:  Diagnosis Date   CAD (coronary artery disease)    a. 10/2021 NSTEMI/PCI: LM nl, LAD min irregs, LCX 29m(2.5x18 Onyx Frontier DES), OM1 nl, RCA 958m3.5x26 Onyx Frontier DES), 30d.   Diastolic dysfunction    a. 10/2021 Echo: EF 50-55%, no rwma, Gr1 DD, nl RV fxn. No significant valvular dzs.   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   left dorsal forearm, EDC   Sleep apnea    Past Surgical History:  Past Surgical History:  Procedure Laterality Date   COLONOSCOPY N/A 10/09/2015   Procedure: COLONOSCOPY;  Surgeon: PaHulen LusterMD;  [facility];  Service: Gastroenterology;  Laterality: N/A;   CORONARY STENT INTERVENTION N/A 10/22/2021   Procedure: CORONARY STENT INTERVENTION;  Surgeon: ArWellington HampshireMD;  [facility];  Service: Cardiovascular;  Laterality: N/A;   LEFT HEART CATH AND CORONARY ANGIOGRAPHY N/A 10/22/2021   Procedure: LEFT HEART CATH AND CORONARY ANGIOGRAPHY;  Surgeon: ArWellington HampshireMD;  [facility];  Service: Cardiovascular;  Laterality: N/A;   ROTATOR CUFF REPAIR     HPI:  Patient is a [age].o. male with PMH: HTN, CAD, HLD, osteoarthritis, SCC, sleep apnea. He presented to the hospital on  05/20/22 with right sided weakness and aphasia with LKW reported by wife to be 10pm on 10/2 when he came to bed and she observed him seeming to be "off". He was  not able to speak and wife then called 911. He arrived at the hospital as code stroke and taken for emergent CT/CTA which demonstrated left M1 occlusion. He received IV tenecteplase and IR thrombectomy due to large vessel occlusion.   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 Patient not able to imitate at phoneme or word level and spontaneous verbalizations were limited to "yeah". He responded to yes/no biographical questions via head nod/"yes" and shake "no" and was accurate for his name identification and acknowledging that visitor was his wife. He responded "yes" to all immediate environmental questions. He was able to point to objects in field of two with 75% accuracy and pointed to things in room (ceiling, TV, ,etc) with 60% accuracy. He was not able to point to body parts without visual demonstration and some tactile cues. He pointed to identify shapes in field of four with 100% accuracy (corrected himself one time) but with alphabet letters he was only 25% accurate. He pointed to [employer] field of 6 with 66% accuracy. He made good eye contact with SLP but did not exhibit any s/s of frustration or awareness to his inability to verbally communciate. He did appear to have good attention and seemed  to work to the best of his ability, correcting himself when pointing to pictures, etc three times during session. SLP is recommending acute care level services as well as SLP services in [facility].    SLP Assessment  SLP Recommendation/Assessment: Patient needs continued Speech [place] [facility] SLP Visit Diagnosis: Aphasia (R47.01)    Recommendations for follow up therapy are one component of a  multi-disciplinary discharge planning process, led by the attending physician.  Recommendations may be updated based on patient status, additional functional criteria and insurance authorization.    Follow Up Recommendations  Acute inpatient rehab (3hours/day)    Assistance Recommended at Discharge  Frequent or constant Supervision/Assistance  Functional Status Assessment Patient has had a recent decline in their functional status and demonstrates the ability to make significant improvements in function in a reasonable and predictable amount of time.  Frequency and Duration min 3x week  2 weeks      SLP Evaluation Cognition  Overall Cognitive Status: Difficult to assess Arousal/Alertness: Awake/alert Orientation Level: Oriented to person Comments: unable to accurately test/judge for memory,, reasoning,, awareness, ,recall, etc due to patient non-verbal with severe expressive aphasia       Comprehension  Auditory Comprehension Overall Auditory Comprehension: Impaired Yes/No Questions: Impaired Basic Biographical Questions: 51-75% accurate Commands: Impaired One Step Basic Commands: 25-49% accurate Interfering Components: Processing speed;Motor planning EffectiveTechniques: Extra processing time;Visual/Gestural cues    Expression Expression Primary Mode of Expression: Verbal Verbal Expression Overall Verbal Expression: Impaired Initiation: Impaired Repetition: Impaired Level of Impairment: Word level Naming: Impairment Responsive: 0-25% accurate Convergent: Not tested Divergent: Not tested Verbal Errors: Not aware of errors Pragmatics: Unable to assess Effective Techniques: Semantic cues   Oral / Motor  Oral Motor/Sensory Function Overall Oral Motor/Sensory Function: Mild impairment Facial ROM: Reduced right Facial Symmetry: Abnormal symmetry right Lingual Strength: Reduced Motor Speech Overall Motor Speech: Other (comment) (patient not producing adequate amount of  verbalizations to determine)            Sonia Baller, MA, CCC-SLP Speech Therapy

## 2022-05-21 NOTE — Procedures (Addendum)
INR. Status post left common carotid arteriogram. Right CFA approach.  Puncture 12.24.am .First pass with complete revascularization  TICI 3 at 12,39 am Findings. 1.  Occluded left internal carotid artery terminus, the left anterior cerebral artery A1 segment in the left M1 segment. Status post complete revascularization of the left internal carotid artery terminal occlusion involving the ACA A1, and MCA with 1 pass with a 4 mm x 40 mm Solitaire retrieval device,and contact aspiration achieving a TICI 3 revascularization. Post CT of the brain no hemorrhagic complications.  8 French Angio-Seal closure device applied for hemostasis at the right groin puncture site.  Distal pulses are all present(dopplerable)unchanged from prior to the procedure. Patient extubated. Maintaining airway.  Pupils 2 mm bilaterally equal and sluggish.  No gross facial asymmetry.  Tongue midline. Moving his left side spontaneously.  Beginning to bend his right knee.  No movement in the right arm. Unable to follow simple commands. Arlean Hopping MD.

## 2022-05-21 NOTE — Evaluation (Signed)
Clinical/Bedside Swallow Evaluation Patient Details  Name: Alan Stewart MRN: [id number] Date of Birth: [birth year]  Today's Date: 05/21/2022 Time: SLP Start Time (ACUTE ONLY): 1250 SLP Stop Time (ACUTE ONLY): 1305 SLP Time Calculation (min) (ACUTE ONLY): 15 min  Past Medical History:  Past Medical History:  Diagnosis Date   CAD (coronary artery disease)    a. 10/2021 NSTEMI/PCI: LM nl, LAD min irregs, LCX 28m(2.5x18 Onyx Frontier DES), OM1 nl, RCA 968m3.5x26 Onyx Frontier DES), 30d.   Diastolic dysfunction    a. 10/2021 Echo: EF 50-55%, no rwma, Gr1 DD, nl RV fxn. No significant valvular dzs.   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   left dorsal forearm, EDC   Sleep apnea    Past Surgical History:  Past Surgical History:  Procedure Laterality Date   COLONOSCOPY N/A 10/09/2015   Procedure: COLONOSCOPY;  Surgeon: PaHulen LusterMD;  [facility];  Service: Gastroenterology;  Laterality: N/A;   CORONARY STENT INTERVENTION N/A 10/22/2021   Procedure: CORONARY STENT INTERVENTION;  Surgeon: ArWellington HampshireMD;  [facility];  Service: Cardiovascular;  Laterality: N/A;   LEFT HEART CATH AND CORONARY ANGIOGRAPHY N/A 10/22/2021   Procedure: LEFT HEART CATH AND CORONARY ANGIOGRAPHY;  Surgeon: ArWellington HampshireMD;  [place] [facility];  Service: Cardiovascular;  Laterality: N/A;   ROTATOR CUFF REPAIR     HPI:  Patient is a [age].o. male with PMH: HTN, CAD, HLD, osteoarthritis, SCC, sleep apnea. He presented to the hospital on 05/20/22 with right sided weakness and aphasia with LKW reported by wife to be 10pm on 10/2 when he came to bed and she observed him seeming to be "off". He was  not able to speak and wife then called 911. He arrived at the hospital as code stroke and taken for emergent CT/CTA which demonstrated left M1 occlusion. He received IV tenecteplase  and IR thrombectomy due to large vessel occlusion.    Assessment / Plan / Recommendation  Clinical Impression  Patient presenting with clinical s/s of dysphagia as per this bedside/clinical swallow evaluation. He exhibited suspected swallow initiation delay with thin liquids but no overt s/s aspiration or penetration during or following both straw and cup sips of thin liquids (water). He exhibited mildly prolonged mastication and oral transit of regular solids resulting in trace to mild amount of PO residual in right lateral sulci. Swallow initiation was suspected to be mildly delayed. Voice remained clear (limited sample as patient is expressively aphasic but able to speak at one word level at times. SLP recommending initiate diet of Dys 2 (fine chop) solids and thin liquids and will follow for toleration and ability to upgrade. SLP Visit Diagnosis: Dysphagia, unspecified (R13.10)    Aspiration Risk  Mild aspiration risk    Diet Recommendation Dysphagia 2 (Fine chop);Thin liquid   Liquid Administration via: Cup;Straw Medication Administration: Whole meds with puree Supervision: Patient able to self feed Compensations: Minimize environmental distractions;Slow rate;Small sips/bites;Lingual sweep for clearance of pocketing Postural Changes: Seated upright at 90 degrees    Other  Recommendations Oral Care Recommendations: Oral care BID    Recommendations for follow up therapy are one component of a multi-disciplinary discharge planning process, led by the attending physician.  Recommendations may be updated based on patient status, additional functional criteria and insurance authorization.  Follow up Recommendations Acute  inpatient rehab (3hours/day)      Assistance Recommended at Discharge Frequent or constant Supervision/Assistance  Functional Status Assessment Patient has had a recent decline in their functional status and demonstrates the ability to make significant improvements in  function in a reasonable and predictable amount of time.  Frequency and Duration min 3x week  2 weeks       Prognosis Prognosis for Safe Diet Advancement: Good      Swallow Study   General Date of Onset: 05/21/22 HPI: Patient is a 76 y.o. male with PMH: HTN, CAD, HLD, osteoarthritis, SCC, sleep apnea. He presented to the hospital on 05/20/22 with right sided weakness and aphasia with LKW reported by wife to be 10pm on 10/2 when he came to bed and she observed him seeming to be "off". He was  not able to speak and wife then called 911. He arrived at the hospital as code stroke and taken for emergent CT/CTA which demonstrated left M1 occlusion. He received IV tenecteplase and IR thrombectomy due to large vessel occlusion. Type of Study: Bedside Swallow Evaluation Previous Swallow Assessment: none found Diet Prior to this Study: NPO Temperature Spikes Noted: No Respiratory Status: Nasal cannula History of Recent Intubation: Yes Length of Intubations (days):  (for procedure only) Behavior/Cognition: Alert;Cooperative;Pleasant mood Oral Cavity Assessment: Other (comment) (some dried dark blood on palate and right side of tongue is bruised from him biting it during extubation) Oral Care Completed by SLP: Yes Oral Cavity - Dentition: Adequate natural dentition Vision: Functional for self-feeding Self-Feeding Abilities: Able to feed self Patient Positioning: Upright in bed Baseline Vocal Quality: Low vocal intensity Volitional Cough: Cognitively unable to elicit Volitional Swallow: Able to elicit    Oral/Motor/Sensory Function Overall Oral Motor/Sensory Function: Mild impairment Facial ROM: Reduced right Facial Symmetry: Abnormal symmetry right Lingual ROM: Reduced right Lingual Strength: Reduced   Ice Chips     Thin Liquid Thin Liquid: Impaired Presentation: Straw;Cup;Self Fed Pharyngeal  Phase Impairments: Suspected delayed Swallow    Nectar Thick     Honey Thick     Puree Puree:  Impaired Oral Phase Functional Implications: Right anterior spillage   Solid     Solid: Impaired Oral Phase Impairments: Impaired mastication Oral Phase Functional Implications: Prolonged oral transit;Impaired mastication;Right lateral sulci pocketing      Sonia Baller, MA, CCC-SLP Speech Therapy

## 2022-05-21 NOTE — TOC CAGE-AID Note (Signed)
Transition of Care [facility]) - CAGE-AID Screening   Patient Details  Name: Alan Stewart MRN: [id number] Date of Birth: 08/26/1945  [facility] of [facility]) CM/SW Contact:    Coralee Pesa, [place] Phone Number: 05/21/2022, 9:51 AM   Clinical Narrative:  Per chart review, pt is disoriented and not appropriate for CAGE- AID assessment.  CAGE-AID Screening: Substance Abuse Screening unable to be completed due to: : Patient unable to participate             Substance Abuse Education Offered: No

## 2022-05-21 NOTE — ED Triage Notes (Signed)
Presents from home via [facility] where he was watching TV with his wife (LKW 2215) when he stopped responding to her verbally and was no longer able to use his right arm. Wife called EMS. Recent stents placed, currently taking brilinta. Wound from recent skin CA removal procedure to L forearm.  At ER, initial BP 135/90, 56bpm, transported to CT where pt started vomiting. '4mg'$  zofran given 2318. Treatment decision made for TNK and IR. TNK given at 2342, arrive at IR at 2350.

## 2022-05-22 ENCOUNTER — Inpatient Hospital Stay (HOSPITAL_COMMUNITY): Payer: Medicare HMO

## 2022-05-22 DIAGNOSIS — E781 Pure hyperglyceridemia: Secondary | ICD-10-CM

## 2022-05-22 DIAGNOSIS — Z9282 Status post administration of tPA (rtPA) in a different facility within the last 24 hours prior to admission to current facility: Secondary | ICD-10-CM | POA: Diagnosis not present

## 2022-05-22 DIAGNOSIS — I6602 Occlusion and stenosis of left middle cerebral artery: Secondary | ICD-10-CM | POA: Diagnosis not present

## 2022-05-22 DIAGNOSIS — I63412 Cerebral infarction due to embolism of left middle cerebral artery: Secondary | ICD-10-CM | POA: Diagnosis not present

## 2022-05-22 DIAGNOSIS — I10 Essential (primary) hypertension: Secondary | ICD-10-CM

## 2022-05-22 LAB — GLUCOSE, CAPILLARY
Glucose-Capillary: 123 mg/dL — ABNORMAL HIGH (ref 70–99)
Glucose-Capillary: 117 mg/dL — ABNORMAL HIGH (ref 70–99)
Glucose-Capillary: 106 mg/dL — ABNORMAL HIGH (ref 70–99)
Glucose-Capillary: 101 mg/dL — ABNORMAL HIGH (ref 70–99)
Glucose-Capillary: 142 mg/dL — ABNORMAL HIGH (ref 70–99)
Glucose-Capillary: 106 mg/dL — ABNORMAL HIGH (ref 70–99)

## 2022-05-22 MED ORDER — ASPIRIN 81 MG PO TBEC
81.0000 mg | DELAYED_RELEASE_TABLET | Freq: Every day | ORAL | Status: DC
  Administered 2022-05-22 – 2022-05-23 (×2): 81 mg via ORAL
  Filled 2022-05-22 (×2): qty 1

## 2022-05-22 MED ORDER — TICAGRELOR 90 MG PO TABS
90.0000 mg | ORAL_TABLET | Freq: Two times a day (BID) | ORAL | Status: DC
  Administered 2022-05-22 – 2022-05-23 (×3): 90 mg via ORAL
  Filled 2022-05-22 (×4): qty 1

## 2022-05-22 MED ORDER — CARVEDILOL 12.5 MG PO TABS
6.2500 mg | ORAL_TABLET | Freq: Two times a day (BID) | ORAL | Status: DC
  Administered 2022-05-22 – 2022-05-23 (×3): 6.25 mg via ORAL
  Filled 2022-05-22 (×3): qty 1

## 2022-05-22 MED ORDER — ENOXAPARIN SODIUM 40 MG/0.4ML IJ SOSY
40.0000 mg | PREFILLED_SYRINGE | INTRAMUSCULAR | Status: DC
  Administered 2022-05-22 – 2022-05-23 (×2): 40 mg via SUBCUTANEOUS
  Filled 2022-05-22 (×2): qty 0.4

## 2022-05-22 MED ORDER — HYDRALAZINE HCL 20 MG/ML IJ SOLN: 5.0000 mg | INTRAMUSCULAR | Status: DC | PRN

## 2022-05-22 NOTE — Progress Notes (Signed)
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 S/p complete revascularization with contact aspiration.   Subjective: Patient sitting up in bed - he just finished his breakfast. He is able to follow most commands (some requiring visual cues) and able to say "yes/no" to most questions.   Allergies: Patient has no known allergies.  Medications: Prior to Admission medications   Medication Sig Start Date End Date Taking? Authorizing Provider  aspirin EC 81 MG EC tablet Take 1 tablet (81 mg total) by mouth daily. Swallow whole. 10/22/21  Yes Dwyane Dee, MD  atorvastatin (LIPITOR) 80 MG tablet Take 1 tablet (80 mg total) by mouth daily. 10/23/21  Yes Dwyane Dee, MD  carvedilol (COREG) 6.25 MG tablet Take 1 tablet (6.25 mg total) by mouth 2 (two) times daily with a meal. 03/21/22  Yes Wellington Hampshire, MD  ezetimibe (ZETIA) 10 MG tablet Take 1 tablet (10 mg total) by mouth daily. 12/18/21 05/22/23 Yes Theora Gianotti, NP  levothyroxine (SYNTHROID) 75 MCG tablet Take 75 mcg by mouth daily before breakfast. 11/01/21 11/01/22 Yes [provider]  ticagrelor (BRILINTA) 90 MG TABS tablet Take 1 tablet (90 mg total) by mouth 2 (two) times daily. 03/21/22  Yes Wellington Hampshire, MD  nitroGLYCERIN (NITROSTAT) 0.4 MG SL tablet Place 1 tablet (0.4 mg total) under the tongue every 5 (five) minutes as needed for chest pain. Patient not taking: Reported on 05/21/2022 10/22/21   Dwyane Dee, MD     Vital Signs: BP (!) 154/66   Pulse 60   Temp (!) 97.4 F (36.3 C) (Oral)   Resp 19   Ht '5\' 7"'$  (1.702 m)   Wt 276 lb 14.4 oz (125.6 kg)   SpO2 98%   BMI 43.37 kg/m   Physical Exam Constitutional:      General: He is not in acute distress.    Appearance: He is not ill-appearing.  Pulmonary:     Effort: Pulmonary effort is normal.   Skin:    General: Skin is warm and dry.  Neurological:     Mental Status: He is alert.     Cranial Nerves: No facial asymmetry.     Coordination: Coordination abnormal. Finger-Nose-Finger Test abnormal.     Comments: Expressive aphasia. 4/5 strength on the right side. 5/5 strength on the left. Patient able to say "yes/no" to Yes or No questions but is unable to say other words.       Imaging: IR PERCUTANEOUS ART THROMBECTOMY/INFUSION INTRACRANIAL INC DIAG ANGIO  Result Date: 05/22/2022 INDICATION: New onset right-sided weakness, global aphasia and right-sided neglect. EXAM: 1. EMERGENT LARGE VESSEL OCCLUSION THROMBOLYSIS (anterior circulation) COMPARISON:  CT angiogram of the head and neck of 05/20/2022. MEDICATIONS: Ancef 2 g IV for antibiotic was administered within 1 hour of the procedure. ANESTHESIA/SEDATION: General anesthesia. CONTRAST:  Omnipaque 300 75 mL. FLUOROSCOPY TIME:  Fluoroscopy Time: 16 minutes 54 seconds (1247 mGy). COMPLICATIONS: None immediate. TECHNIQUE: Following a full explanation of the procedure along with the potential associated complications, an informed witnessed consent was obtained. The risks of intracranial hemorrhage of 10%, worsening neurological deficit, ventilator dependency, death and inability to revascularize were all reviewed in detail with the patient's spouse. The patient was then put under general anesthesia by the Department of Anesthesiology at [facility]. The right groin was prepped and draped in the usual sterile  fashion. Thereafter using modified Seldinger technique, transfemoral access into the right common femoral artery was obtained without difficulty. Over a 0.035 inch guidewire an 8 French 25 cm Pinnacle sheath was inserted. Through this, and also over a 0.035 inch guidewire a combination of a Simmons 2 support catheter inside of a 95 cm 087 balloon guide catheter was advanced to the aortic arch as evidenced in the left internal carotid  artery bulb without evidence ulcerations arch region, and selectively positioned in the left common carotid artery, and then advanced to the distal cervical left ICA. The guidewire was removed. Good aspiration obtained from the hub of the balloon guide catheter. Gentle control arteriogram through this demonstrated no evidence of vasospasm, dissections or intraluminal filling defects. FINDINGS: The left common carotid arteriogram demonstrates the left external carotid artery and its major branches to be widely patent. The left internal carotid artery at the bulb to the cranial skull base demonstrates patency with gradual reduced caliber at the skull base. Nonocclusive atherosclerotic plaque noted in the left carotid bulb. 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 Patency of the ipsilateral ophthalmic artery noted. PROCEDURE: Through the balloon guide catheter in the cervical petrous junction, over an 014 inch Softip Aristotle micro guidewire with a moderate J configuration, an 021 160 cm microcatheter inside of a 132 cm Zoom aspiration catheter combination was advanced initially in the proximal supraclinoid segment, and then using a torque device access into the left middle cerebral artery inferior division M2 region was obtained with the micro guidewire followed by the microcatheter. The Zoom aspiration catheter was advanced to the region of the mid M1 segment. The micro guidewire was removed. Good aspiration was obtained from the hub of the microcatheter. A gentle control arteriogram performed through this demonstrates safe positioning of the tip of the microcatheter. This was then connected to continuous heparinized saline infusion. A 4 mm x 40 mm Solitaire X retrieval device was then advanced to the distal end of the microcatheter, and the retriever was deployed in the usual fashion.  Contact aspiration had already been started through the Zoom aspiration catheter in the occluded M1 segment. With proximal flow arrest in the left internal carotid artery, and contact aspiration the Zoom aspiration catheter, for 2 minutes, the combination of the retrieval device, the microcatheter and the Zoom aspiration catheter were retrieved and removed. Reversal of flow arrest, a control arteriogram via balloon guide catheter demonstrated complete revascularization of the supraclinoid left ICA, the left anterior cerebral artery and the left MCA distribution achieving a TICI 3 revascularization. No evidence of filling defects was noted. Moderate spasm in the left M1 segment responded to 25 mcg of nitroglycerin intra-arterially. The balloon guide catheter was retrieved and removed. An 8 French Angio-Seal closure device was then deployed for hemostasis at the right groin puncture site. Distal pulses remained Dopplerable in both feet unchanged. A flat panel CT of the brain demonstrated no evidence of hemorrhagic complications or mass effect. Patient's general anesthesia was reversed and patient was extubated. Upon recovery, the patient was able to move his left side spontaneously and able to bend his right knee. No spontaneous movement was evident in the right upper extremity. Patient continued to have comprehension difficulties. He was then transferred to the medical ICU for post revascularization care. IMPRESSION: Status post complete revascularization of the left internal carotid artery terminus occlusion, the left middle cerebral  artery and the left anterior cerebral artery distribution with 1 pass with a 4 mm x 40 mm Solitaire X retrieval device and contact aspiration achieving a TICI 3 revascularization. Time from groin puncture to complete revascularization approximately 15 minutes. PLAN: Follow-up as per referring MD. Electronically Signed   By: Luanne Bras M.D.   On: 05/22/2022 10:33   IR CT Head  Ltd  Result Date: 05/22/2022 INDICATION: New onset right-sided weakness, global aphasia and right-sided neglect. EXAM: 1. EMERGENT LARGE VESSEL OCCLUSION THROMBOLYSIS (anterior circulation) COMPARISON:  CT angiogram of the head and neck of 05/20/2022. MEDICATIONS: Ancef 2 g IV for antibiotic was administered within 1 hour of the procedure. ANESTHESIA/SEDATION: General anesthesia. CONTRAST:  Omnipaque 300 75 mL. FLUOROSCOPY TIME:  Fluoroscopy Time: 16 minutes 54 seconds (1247 mGy). COMPLICATIONS: None immediate. TECHNIQUE: Following a full explanation of the procedure along with the potential associated complications, an informed witnessed consent was obtained. The risks of intracranial hemorrhage of 10%, worsening neurological deficit, ventilator dependency, death and inability to revascularize were all reviewed in detail with the patient's spouse. The patient was then put under general anesthesia by the Department of Anesthesiology at [facility]. The right groin was prepped and draped in the usual sterile fashion. Thereafter using modified Seldinger technique, transfemoral access into the right common femoral artery was obtained without difficulty. Over a 0.035 inch guidewire an 8 French 25 cm Pinnacle sheath was inserted. Through this, and also over a 0.035 inch guidewire a combination of a Simmons 2 support catheter inside of a 95 cm 087 balloon guide catheter was advanced to the aortic arch as evidenced in the left internal carotid artery bulb without evidence ulcerations arch region, and selectively positioned in the left common carotid artery, and then advanced to the distal cervical left ICA. The guidewire was removed. Good aspiration obtained from the hub of the balloon guide catheter. Gentle control arteriogram through this demonstrated no evidence of vasospasm, dissections or intraluminal filling defects. FINDINGS: The left common carotid arteriogram demonstrates the left external carotid artery  and its major branches to be widely patent. The left internal carotid artery at the bulb to the cranial skull base demonstrates patency with gradual reduced caliber at the skull base. Nonocclusive atherosclerotic plaque noted in the left carotid bulb. 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 Patency of the ipsilateral ophthalmic artery noted. PROCEDURE: Through the balloon guide catheter in the cervical petrous junction, over an 014 inch Softip Aristotle micro guidewire with a moderate J configuration, an 021 160 cm microcatheter inside of a 132 cm Zoom aspiration catheter combination was advanced initially in the proximal supraclinoid segment, and then using a torque device access into the left middle cerebral artery inferior division M2 region was obtained with the micro guidewire followed by the microcatheter. The Zoom aspiration catheter was advanced to the region of the mid M1 segment. The micro guidewire was removed. Good aspiration was obtained from the hub of the microcatheter. A gentle control arteriogram performed through this demonstrates safe positioning of the tip of the microcatheter. This was then connected to continuous heparinized saline infusion. A 4 mm x 40 mm Solitaire X retrieval device was then advanced to the distal end of the microcatheter, and the retriever was deployed in the usual fashion. Contact aspiration had already been started through the Zoom aspiration catheter in the occluded M1 segment. With proximal  flow arrest in the left internal carotid artery, and contact aspiration the Zoom aspiration catheter, for 2 minutes, the combination of the retrieval device, the microcatheter and the Zoom aspiration catheter were retrieved and removed. Reversal of flow arrest, a control arteriogram via balloon guide catheter demonstrated complete revascularization of the  supraclinoid left ICA, the left anterior cerebral artery and the left MCA distribution achieving a TICI 3 revascularization. No evidence of filling defects was noted. Moderate spasm in the left M1 segment responded to 25 mcg of nitroglycerin intra-arterially. The balloon guide catheter was retrieved and removed. An 8 French Angio-Seal closure device was then deployed for hemostasis at the right groin puncture site. Distal pulses remained Dopplerable in both feet unchanged. A flat panel CT of the brain demonstrated no evidence of hemorrhagic complications or mass effect. Patient's general anesthesia was reversed and patient was extubated. Upon recovery, the patient was able to move his left side spontaneously and able to bend his right knee. No spontaneous movement was evident in the right upper extremity. Patient continued to have comprehension difficulties. He was then transferred to the medical ICU for post revascularization care. IMPRESSION: Status post complete revascularization of the left internal carotid artery terminus occlusion, the left middle cerebral artery and the left anterior cerebral artery distribution with 1 pass with a 4 mm x 40 mm Solitaire X retrieval device and contact aspiration achieving a TICI 3 revascularization. Time from groin puncture to complete revascularization approximately 15 minutes. PLAN: Follow-up as per referring MD. Electronically Signed   By: Luanne Bras M.D.   On: 05/22/2022 10:33   MR BRAIN WO CONTRAST  Result Date: 05/22/2022 CLINICAL DATA:  Left MCA infarct EXAM: MRI HEAD WITHOUT CONTRAST MRA HEAD WITHOUT CONTRAST TECHNIQUE: Multiplanar, multi-echo pulse sequences of the brain and surrounding structures were acquired without intravenous contrast. Angiographic images of the Circle of Willis were acquired using MRA technique without intravenous contrast. COMPARISON:  None Available. FINDINGS: MRI HEAD FINDINGS Brain: Cortical diffusion restriction throughout the  left MCA territory. Small focus of acute hemorrhage along the anterior surface of the left temporal lobe. Punctate focus of diffusion restriction left occipital. Normal white matter signal, parenchymal volume and CSF spaces. The midline structures are normal. Vascular: Major flow voids are preserved. Skull and upper cervical spine: Normal calvarium and skull base. Visualized upper cervical spine and soft tissues are normal. Sinuses/Orbits:No paranasal sinus fluid levels or advanced mucosal thickening. No mastoid or middle ear effusion. Normal orbits. MRA HEAD FINDINGS POSTERIOR CIRCULATION: --Vertebral arteries: Normal --Inferior cerebellar arteries: Normal. --Basilar artery: Normal. --Superior cerebellar arteries: Normal. --Posterior cerebral arteries: Normal. ANTERIOR CIRCULATION: --Intracranial internal carotid arteries: Normal. --Anterior cerebral arteries (ACA): Normal. --Middle cerebral arteries (MCA): Normal. ANATOMIC VARIANTS: None IMPRESSION: 1. Cortical ischemia throughout the left MCA territory compatible with recent infarct. Relatively little affect on the white matter. 2. Small focus of acute hemorrhage along the anterior surface of the left temporal lobe Heidelberg classification 3c: Subarachnoid hemorrhage. 3. Normal intracranial MRA.  Restored flow in left MCA. Electronically Signed   By: Ulyses Jarred M.D.   On: 05/22/2022 00:55   MR ANGIO HEAD WO CONTRAST  Result Date: 05/22/2022 CLINICAL DATA:  Left MCA infarct EXAM: MRI HEAD WITHOUT CONTRAST MRA HEAD WITHOUT CONTRAST TECHNIQUE: Multiplanar, multi-echo pulse sequences of the brain and surrounding structures were acquired without intravenous contrast. Angiographic images of the Circle of Willis were acquired using MRA technique without intravenous contrast. COMPARISON:  None Available. FINDINGS: MRI HEAD FINDINGS Brain: Cortical diffusion restriction  throughout the left MCA territory. Small focus of acute hemorrhage along the anterior surface  of the left temporal lobe. Punctate focus of diffusion restriction left occipital. Normal white matter signal, parenchymal volume and CSF spaces. The midline structures are normal. Vascular: Major flow voids are preserved. Skull and upper cervical spine: Normal calvarium and skull base. Visualized upper cervical spine and soft tissues are normal. Sinuses/Orbits:No paranasal sinus fluid levels or advanced mucosal thickening. No mastoid or middle ear effusion. Normal orbits. MRA HEAD FINDINGS POSTERIOR CIRCULATION: --Vertebral arteries: Normal --Inferior cerebellar arteries: Normal. --Basilar artery: Normal. --Superior cerebellar arteries: Normal. --Posterior cerebral arteries: Normal. ANTERIOR CIRCULATION: --Intracranial internal carotid arteries: Normal. --Anterior cerebral arteries (ACA): Normal. --Middle cerebral arteries (MCA): Normal. ANATOMIC VARIANTS: None IMPRESSION: 1. Cortical ischemia throughout the left MCA territory compatible with recent infarct. Relatively little affect on the white matter. 2. Small focus of acute hemorrhage along the anterior surface of the left temporal lobe Heidelberg classification 3c: Subarachnoid hemorrhage. 3. Normal intracranial MRA.  Restored flow in left MCA. Electronically Signed   By: Ulyses Jarred M.D.   On: 05/22/2022 00:55   CT HEAD WO CONTRAST  Result Date: 05/22/2022 CLINICAL DATA:  24 hour follow-up after TNK administration. EXAM: CT HEAD WITHOUT CONTRAST TECHNIQUE: Contiguous axial images were obtained from the base of the skull through the vertex without intravenous contrast. RADIATION DOSE REDUCTION: This exam was performed according to the departmental dose-optimization program which includes automated exposure control, adjustment of the mA and/or kV according to patient size and/or use of iterative reconstruction technique. COMPARISON:  Head CT 05/20/2022 FINDINGS: Brain: There is now a small focus of subarachnoid hemorrhage over the superior surface of the  left temporal lobe. No other site of acute hemorrhage. No midline shift or other mass effect. Slightly increased area of hypoattenuation at the left insula. Vascular: No hyperdense vessel or unexpected calcification. Skull: Normal. Negative for fracture or focal lesion. Sinuses/Orbits: No acute finding. Other: None. IMPRESSION: 1. Small focus of subarachnoid hemorrhage over the superior surface of the left temporal lobe. 2. Slightly increased area of early subacute infarct of the left insula. Critical Value/emergent results were called by telephone at the time of interpretation on 05/22/2022 at 12:05 am to provider MCNE[facility] , who verbally acknowledged these results. Electronically Signed   By: Ulyses Jarred M.D.   On: 05/22/2022 00:07   VAS [place] LOWER EXTREMITY VENOUS (DVT)  Result Date: 05/21/2022  Lower Venous DVT Study Patient Name:  Alan Stewart  Date of Exam:   05/21/2022 Medical Rec #: [id number]       Accession #:    [id number] Date of Birth: [birth year]        Patient Gender: M Patient Age:   76 years Exam Location:  [facility] Procedure:      VAS [place] LOWER EXTREMITY VENOUS (DVT) Referring Phys: Cornelius Moras XU --------------------------------------------------------------------------------  Indications: Embolic stroke, and Edema.  Comparison Study: No priors Performing Technologist: Velva Harman Sturdivant RDMS, RVT  Examination Guidelines: A complete evaluation includes B-mode imaging, spectral Doppler, color Doppler, and power Doppler as needed of all accessible portions of each vessel. Bilateral testing is considered an integral part of a complete examination. Limited examinations for reoccurring indications may be performed as noted. The reflux portion of the exam is performed with the patient in reverse Trendelenburg.  +---------+---------------+---------+-----------+----------+--------------+ RIGHT    CompressibilityPhasicitySpontaneityPropertiesThrombus Aging  +---------+---------------+---------+-----------+----------+--------------+ CFV      Full           No  Yes                                 +---------+---------------+---------+-----------+----------+--------------+ SFJ      Full                                                        +---------+---------------+---------+-----------+----------+--------------+ FV Prox  Full                                                        +---------+---------------+---------+-----------+----------+--------------+ FV Mid   Full                                                        +---------+---------------+---------+-----------+----------+--------------+ FV DistalFull                                                        +---------+---------------+---------+-----------+----------+--------------+ PFV      Full                                                        +---------+---------------+---------+-----------+----------+--------------+ POP      Full           Yes      Yes                                 +---------+---------------+---------+-----------+----------+--------------+ PTV      Full                                                        +---------+---------------+---------+-----------+----------+--------------+ PERO     Full                                                        +---------+---------------+---------+-----------+----------+--------------+   +---------+---------------+---------+-----------+----------+--------------+ LEFT     CompressibilityPhasicitySpontaneityPropertiesThrombus Aging +---------+---------------+---------+-----------+----------+--------------+ CFV      Full           Yes      Yes                                 +---------+---------------+---------+-----------+----------+--------------+ SFJ      Full                                                         +---------+---------------+---------+-----------+----------+--------------+  FV Prox  Full                                                        +---------+---------------+---------+-----------+----------+--------------+ FV Mid   Full                                                        +---------+---------------+---------+-----------+----------+--------------+ FV DistalFull                                                        +---------+---------------+---------+-----------+----------+--------------+ PFV      Full                                                        +---------+---------------+---------+-----------+----------+--------------+ POP      Full           Yes      Yes                                 +---------+---------------+---------+-----------+----------+--------------+ PTV      Full                                                        +---------+---------------+---------+-----------+----------+--------------+ PERO     Full                                                        +---------+---------------+---------+-----------+----------+--------------+     Summary: BILATERAL: - No evidence of deep vein thrombosis seen in the lower extremities, bilaterally. -No evidence of popliteal cyst, bilaterally.   *See table(s) above for measurements and observations. Electronically signed by Deitra Mayo MD on 05/21/2022 at 5:01:56 PM.    Final    ECHOCARDIOGRAM COMPLETE  Result Date: 05/21/2022    ECHOCARDIOGRAM REPORT   Patient Name:   Alan Stewart Date of Exam: 05/21/2022 Medical Rec #:  [id number]      Height:       67.0 in Accession #:    [id number]     Weight:       276.9 lb Date of Birth:  1945/08/23       BSA:          2.322 m Patient Age:    89 years       BP:           124/57 mmHg Patient Gender: M  HR:           64 bpm. Exam Location:  Inpatient Procedure: 2D Echo, Cardiac Doppler, Color Doppler and Intracardiac             Opacification Agent Indications:    stroke  History:        Patient has prior history of Echocardiogram examinations, most                 recent 10/21/2021. CAD; Risk Factors:Hypertension, Dyslipidemia                 and Sleep Apnea.  Sonographer:    Johny Chess RDCS Referring Phys: [place]  Sonographer Comments: Technically difficult study due to poor echo windows. Image acquisition challenging due to patient body habitus and Image acquisition challenging due to respiratory motion. IMPRESSIONS  1. Left ventricular ejection fraction, by estimation, is 60 to 65%. The left ventricle has normal function. The left ventricle has no regional wall motion abnormalities. Left ventricular diastolic parameters are consistent with Grade I diastolic dysfunction (impaired relaxation).  2. Right ventricular systolic function is normal. The right ventricular size is normal. Tricuspid regurgitation signal is inadequate for assessing PA pressure.  3. The mitral valve is normal in structure. No evidence of mitral valve regurgitation. No evidence of mitral stenosis.  4. The aortic valve is tricuspid. Aortic valve regurgitation is not visualized. No aortic stenosis is present.  5. The inferior vena cava is normal in size with greater than 50% respiratory variability, suggesting right atrial pressure of 3 mmHg. FINDINGS  Left Ventricle: Left ventricular ejection fraction, by estimation, is 60 to 65%. The left ventricle has normal function. The left ventricle has no regional wall motion abnormalities. Definity contrast agent was given IV to delineate the left ventricular  endocardial borders. The left ventricular internal cavity size was normal in size. There is no left ventricular hypertrophy. Left ventricular diastolic parameters are consistent with Grade I diastolic dysfunction (impaired relaxation). Normal left ventricular filling pressure. Right Ventricle: The right ventricular size is normal. No increase in  right ventricular wall thickness. Right ventricular systolic function is normal. Tricuspid regurgitation signal is inadequate for assessing PA pressure. Left Atrium: Left atrial size was normal in size. Right Atrium: Right atrial size was normal in size. Pericardium: There is no evidence of pericardial effusion. Presence of epicardial fat layer. Mitral Valve: The mitral valve is normal in structure. No evidence of mitral valve regurgitation. No evidence of mitral valve stenosis. Tricuspid Valve: The tricuspid valve is normal in structure. Tricuspid valve regurgitation is not demonstrated. No evidence of tricuspid stenosis. Aortic Valve: The aortic valve is tricuspid. Aortic valve regurgitation is not visualized. No aortic stenosis is present. Pulmonic Valve: The pulmonic valve was grossly normal. Pulmonic valve regurgitation is not visualized. No evidence of pulmonic stenosis. Aorta: The aortic root is normal in size and structure. Venous: The inferior vena cava is normal in size with greater than 50% respiratory variability, suggesting right atrial pressure of 3 mmHg. IAS/Shunts: No atrial level shunt detected by color flow Doppler.  LEFT VENTRICLE PLAX 2D LVIDd:         5.60 cm   Diastology LVIDs:         3.20 cm   LV e' medial:    7.29 cm/s LV PW:         1.10 cm   LV E/e' medial:  8.9 LV IVS:        1.00 cm  LV e' lateral:   7.72 cm/s LVOT diam:     2.20 cm   LV E/e' lateral: 8.4 LV SV:         60 LV SV Index:   26 LVOT Area:     3.80 cm  RIGHT VENTRICLE             IVC RV S prime:     16.40 cm/s  IVC diam: 1.50 cm TAPSE (M-mode): 2.1 cm LEFT ATRIUM           Index        RIGHT ATRIUM           Index LA diam:      3.40 cm 1.46 cm/m   RA Area:     13.00 cm LA Vol (A4C): 49.0 ml 21.10 ml/m  RA Volume:   30.30 ml  13.05 ml/m  AORTIC VALVE LVOT Vmax:   77.70 cm/s LVOT Vmean:  49.100 cm/s LVOT VTI:    0.157 m  AORTA Ao Root diam: 3.40 cm MITRAL VALVE MV Area (PHT): 3.37 cm    SHUNTS MV Decel Time: 225 msec     Systemic VTI:  0.16 m MV E velocity: 64.60 cm/s  Systemic Diam: 2.20 cm MV A velocity: 74.70 cm/s MV E/A ratio:  0.86 Mihai Croitoru MD Electronically signed by Sanda Klein MD Signature Date/Time: 05/21/2022/10:55:08 AM    Final    CT ANGIO HEAD NECK W WO CM (CODE STROKE)  Result Date: 05/21/2022 CLINICAL DATA:  Acute neurologic deficit EXAM: CT ANGIOGRAPHY HEAD AND NECK TECHNIQUE: Multidetector CT imaging of the head and neck was performed using the standard protocol during bolus administration of intravenous contrast. Multiplanar CT image reconstructions and MIPs were obtained to evaluate the vascular anatomy. Carotid stenosis measurements (when applicable) are obtained utilizing NASCET criteria, using the distal internal carotid diameter as the denominator. RADIATION DOSE REDUCTION: This exam was performed according to the departmental dose-optimization program which includes automated exposure control, adjustment of the mA and/or kV according to patient size and/or use of iterative reconstruction technique. CONTRAST:  48m OMNIPAQUE IOHEXOL 350 MG/ML SOLN COMPARISON:  None Available. FINDINGS: CTA NECK FINDINGS SKELETON: There is no bony spinal canal stenosis. No lytic or blastic lesion. OTHER NECK: Normal pharynx, larynx and major salivary glands. No cervical lymphadenopathy. Unremarkable thyroid gland. UPPER CHEST: No pneumothorax or pleural effusion. No nodules or masses. AORTIC ARCH: There is calcific atherosclerosis of the aortic arch. There is no aneurysm, dissection or hemodynamically significant stenosis of the visualized portion of the aorta. Conventional 3 vessel aortic branching pattern. The visualized proximal subclavian arteries are widely patent. RIGHT CAROTID SYSTEM: No dissection, occlusion or aneurysm. There is mixed density atherosclerosis extending into the proximal ICA, resulting in less than 50% stenosis. LEFT CAROTID SYSTEM: No dissection, occlusion or aneurysm. There is mixed density  atherosclerosis extending into the proximal ICA, resulting in less than 50% stenosis. VERTEBRAL ARTERIES: Codominant configuration. Both origins are clearly patent. There is no dissection, occlusion or flow-limiting stenosis to the skull base (V1-V3 segments). CTA HEAD FINDINGS POSTERIOR CIRCULATION: --Vertebral arteries: Normal V4 segments. --Inferior cerebellar arteries: Normal. --Basilar artery: Normal. --Superior cerebellar arteries: Normal. --Posterior cerebral arteries (PCA): Normal. ANTERIOR CIRCULATION: --Intracranial internal carotid arteries: With atherosclerotic calcification within both internal carotid arteries at the skull base. The terminus of the left ICA is occluded. --Anterior cerebral arteries (ACA): Normal. Both A1 segments are present. Patent anterior communicating artery (a-comm). --Middle cerebral arteries (MCA): There is occlusion of the left  MCA with minimal distal collateralization. The right MCA is normal. VENOUS SINUSES: As permitted by contrast timing, patent. ANATOMIC VARIANTS: None Review of the MIP images confirms the above findings. IMPRESSION: 1. 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 2. Bilateral carotid bifurcation atherosclerosis without hemodynamically significant stenosis. Critical Value/emergent results were called by telephone at the time of interpretation on 05/21/2022 at 12:00 am to provider MCNEILL [facility] , who verbally acknowledged these results. Aortic atherosclerosis (ICD10-I70.0). Electronically Signed   By: Ulyses Jarred M.D.   On: 05/21/2022 00:02   CT HEAD CODE STROKE WO CONTRAST  Result Date: 05/20/2022 CLINICAL DATA:  Code stroke.  Acute neurologic deficit EXAM: CT HEAD WITHOUT CONTRAST TECHNIQUE: Contiguous axial images were obtained from the base of the skull through the vertex without intravenous contrast. RADIATION DOSE REDUCTION: This exam was performed according to the departmental  dose-optimization program which includes automated exposure control, adjustment of the mA and/or kV according to patient size and/or use of iterative reconstruction technique. COMPARISON:  None Available. FINDINGS: Brain: There is no mass, hemorrhage or extra-axial collection. The size and configuration of the ventricles and extra-axial CSF spaces are normal. The brain parenchyma is normal, without evidence of acute or chronic infarction. Cortical calcification left paracentral. Vascular: Atherosclerotic calcification of the internal carotid arteries at the skull base. No abnormal hyperdensity of the major intracranial arteries or dural venous sinuses. Skull: The visualized skull base, calvarium and extracranial soft tissues are normal. Sinuses/Orbits: No fluid levels or advanced mucosal thickening of the visualized paranasal sinuses. No mastoid or middle ear effusion. The orbits are normal. ASPECTS [facility] Stroke Program Early CT Score) - Ganglionic level infarction (caudate, lentiform nuclei, internal capsule, insula, M1-M3 cortex): 7 - Supraganglionic infarction (M4-M6 cortex): 3 Total score (0-10 with 10 being normal): 10 IMPRESSION: 1. No acute intracranial abnormality. 2. ASPECTS is 10 These results were communicated to Dr. Roland Rack at 11:33 pm on 05/20/2022 by text page via the [facility] messaging system. Electronically Signed   By: Ulyses Jarred M.D.   On: 05/20/2022 23:34    Labs:  CBC: Recent Labs    10/21/21 0407 10/22/21 0409 05/20/22 2315 05/20/22 2319 05/21/22 0433  WBC 5.2 5.3 6.8  --  7.2  HGB 15.4 14.7 14.3 14.6 13.4  HCT 46.1 44.2 43.7 43.0 39.8  PLT 221 210 243  --  207    COAGS: Recent Labs    10/20/21 1142 05/20/22 2315  INR 1.0 1.0  APTT 27 25    BMP: Recent Labs    10/21/21 0407 10/22/21 0409 05/20/22 2315 05/20/22 2319 05/21/22 0433  NA 134* 138 142 141 138  K 3.7 4.0 4.2 4.0 4.0  CL 103 105 108 105 110  CO2 '25 24 22  '$ --  22  GLUCOSE 140* 118*  117* 109* 195*  BUN '12 17 17 19 15  '$ CALCIUM 8.9 8.8* 9.3  --  8.3*  CREATININE 0.97 1.09 1.20 1.20 1.08  GFRNONAA >60 >60 >60  --  >60    LIVER FUNCTION TESTS: Recent Labs    10/20/21 0931 12/18/21 1101 05/20/22 2315  BILITOT 0.7 1.2 0.6  AST 34 25 29  ALT '19 23 30  '$ ALKPHOS 79 105 105  PROT 7.2 7.1 6.1*  ALBUMIN 3.7 3.5 3.3*    Assessment and Plan:  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 S/p complete revascularization with contact aspiration.  Breathing comfortably  on nasal cannula, able to eat/feed self, and is moving all extremities. Able to follow most commands with verbal and/or visual cues. Coordination impaired. Expressive aphasia. Right groin vascular site is clean, soft and dry. He is receiving aspirin, brilinta and lovenox. He has tentative plans to transfer out of the ICU today.   Patient being evaluated for outpatient physical therapy.   Please call IR with any questions.   Electronically Signed: Soyla Dryer, AGACNP-BC [phone] 05/22/2022, 11:25 AM   I spent a total of 15 Minutes at the the patient's bedside AND on the patient's hospital floor or unit, greater than 50% of which was counseling/coordinating care for Left MCA infarct s/p intervention

## 2022-05-22 NOTE — Progress Notes (Signed)
Physical Therapy Treatment Patient Details Name: Alan Stewart MRN: [id number] DOB: 02/01/1946 Today's Date: 05/22/2022   History of Present Illness Patient is a 76 y/o male with PMH of HTN, CAD, HLD, arthritis, SCC, sleep apnea who presented wtih R side weakness and aphasia now s/p IV tenecteplase and IR thrombectomy of L carotid terminal occlusion involving ACA & MCA.    PT Comments    Pt has made good progress toward goals with gait improvement and overall improvement in balance/stability.  Recommendations for follow up therapy are one component of a multi-disciplinary discharge planning process, led by the attending physician.  Recommendations may be updated based on patient status, additional functional criteria and insurance authorization.  Follow Up Recommendations  Outpatient PT     Assistance Recommended at Discharge Intermittent Supervision/Assistance  Patient can return home with the following A little help with walking and/or transfers;A little help with bathing/dressing/bathroom;Direct supervision/assist for medications management;Help with stairs or ramp for entrance;Assistance with cooking/housework   Equipment Recommendations  None recommended by PT    Recommendations for Other Services       Precautions / Restrictions Precautions Precautions: Fall     Mobility  Bed Mobility Overal bed mobility: Needs Assistance Bed Mobility: Supine to Sit     Supine to sit: Min guard (from flattened HOB, not rails)     General bed mobility comments: moved LE's off the bed and up via R elbow from flat bed without assist    Transfers Overall transfer level: Needs assistance   Transfers: Sit to/from Stand Sit to Stand: Min assist           General transfer comment: use of LE's against the bed with cues for  hand placement and stability assist    Ambulation/Gait Ambulation/Gait assistance: Min assist, Min guard Gait Distance (Feet): 200 Feet Assistive device: IV  Pole, 1 person hand held assist, None Gait Pattern/deviations: Step-through pattern   Gait velocity interpretation: <1.8 ft/sec, indicate of risk for recurrent falls   General Gait Details: mildly paretic, but with step through pattern and min stability assist progressing toward min guard assist.  Challenges to balance including directional changes, speed changes, scanning and backing up produced deviation without Over LOB.  Overall pt still guarded.   Stairs             Wheelchair Mobility    Modified Rankin (Stroke Patients Only) Modified Rankin (Stroke Patients Only) Pre-Morbid Rankin Score: No symptoms Modified Rankin: Moderate disability     Balance Overall balance assessment: Needs assistance   Sitting balance-Leahy Scale: Good       Standing balance-Leahy Scale: Fair                              Cognition Arousal/Alertness: Awake/alert Behavior During Therapy: WFL for tasks assessed/performed Overall Cognitive Status: Difficult to assess (but functional for session)                                          Exercises      General Comments General comments (skin integrity, edema, etc.): vss overall, Sats reading 89-92% on RA      Pertinent Vitals/Pain Pain Assessment Pain Assessment: Faces Faces Pain Scale: No hurt Pain Intervention(s): Monitored during session    Home Living  Prior Function            PT Goals (current goals can now be found in the care plan section) Acute Rehab PT Goals PT Goal Formulation: With patient/family Time For Goal Achievement: 06/04/22 Potential to Achieve Goals: Good Progress towards PT goals: Progressing toward goals    Frequency    Min 4X/week      PT Plan Current plan remains appropriate    Co-evaluation              AM-PAC PT "6 Clicks" Mobility   Outcome Measure  Help needed turning from your back to your side while in a flat  bed without using bedrails?: A Little Help needed moving from lying on your back to sitting on the side of a flat bed without using bedrails?: A Little Help needed moving to and from a bed to a chair (including a wheelchair)?: A Little Help needed standing up from a chair using your arms (e.g., wheelchair or bedside chair)?: A Little Help needed to walk in hospital room?: A Little Help needed climbing 3-5 steps with a railing? : A Little 6 Click Score: 18    End of Session   Activity Tolerance: Patient tolerated treatment well Patient left: Other (comment) (With OT) Nurse Communication: Mobility status PT Visit Diagnosis: Other abnormalities of gait and mobility (R26.89);Other symptoms and signs involving the nervous system (G25.427)     Time: 0623-7628 PT Time Calculation (min) (ACUTE ONLY): 19 min  Charges:  $Gait Training: 8-22 mins                     05/22/2022  Ginger Carne., PT Acute Rehabilitation Services [phone]  (office)   Alan Stewart 05/22/2022, 12:52 PM

## 2022-05-22 NOTE — Progress Notes (Addendum)
STROKE TEAM PROGRESS NOTE   SUBJECTIVE (INTERVAL HISTORY) No family is present at bedside today.  Off of Cleviprex 10/3 @ 11:22 Exam with continued aphasia. On ASA + Brilinta PTA, will continue at discharge if patient is tolerating well.  Will need loop recorder.  Transfer out of ICU today, PT evaluation with outpatient PT therapy recommendations. Plan for possible discharge tomorrow if stable overnight   OBJECTIVE Temp:  [98 F (36.7 C)-99.1 F (37.3 C)] 98.1 F (36.7 C) (10/04 0805) Pulse Rate:  [56-73] 60 (10/04 0700) Cardiac Rhythm: Normal sinus rhythm (10/04 0400) Resp:  [13-26] 19 (10/04 0700) BP: (90-168)/(47-104) 154/66 (10/04 0700) SpO2:  [93 %-100 %] 98 % (10/04 0700) Arterial Line BP: (130-182)/(48-76) 130/76 (10/04 0300)  Recent Labs  Lab 05/21/22 1603 05/21/22 1941 05/21/22 2326 05/22/22 0324 05/22/22 0802  GLUCAP 119* 135* 107* 123* 117*    Recent Labs  Lab 05/20/22 2315 05/20/22 2319 05/21/22 0433  NA 142 141 138  K 4.2 4.0 4.0  CL 108 105 110  CO2 22  --  22  GLUCOSE 117* 109* 195*  BUN '17 19 15  '$ CREATININE 1.20 1.20 1.08  CALCIUM 9.3  --  8.3*    Recent Labs  Lab 05/20/22 2315  AST 29  ALT 30  ALKPHOS 105  BILITOT 0.6  PROT 6.1*  ALBUMIN 3.3*    Recent Labs  Lab 05/20/22 2315 05/20/22 2319 05/21/22 0433  WBC 6.8  --  7.2  NEUTROABS 3.6  --  5.9  HGB 14.3 14.6 13.4  HCT 43.7 43.0 39.8  MCV 90.3  --  90.0  PLT 243  --  207    No results for input(s): "CKTOTAL", "CKMB", "CKMBINDEX", "TROPONINI" in the last 168 hours. Recent Labs    05/20/22 2315  LABPROT 12.9  INR 1.0    No results for input(s): "COLORURINE", "LABSPEC", "PHURINE", "GLUCOSEU", "HGBUR", "BILIRUBINUR", "KETONESUR", "PROTEINUR", "UROBILINOGEN", "NITRITE", "LEUKOCYTESUR" in the last 72 hours.  Invalid input(s): "APPERANCEUR"     Component Value Date/Time   CHOL 106 05/21/2022 0433   TRIG 83 05/21/2022 0433   HDL 33 (L) 05/21/2022 0433   CHOLHDL 3.2  05/21/2022 0433   VLDL 17 05/21/2022 0433   LDLCALC 56 05/21/2022 0433   Lab Results  Component Value Date   HGBA1C 6.0 (H) 05/21/2022      Component Value Date/Time   LABOPIA NONE DETECTED 05/21/2022 0910   COCAINSCRNUR NONE DETECTED 05/21/2022 0910   LABBENZ NONE DETECTED 05/21/2022 0910   AMPHETMU NONE DETECTED 05/21/2022 0910   THCU NONE DETECTED 05/21/2022 0910   LABBARB NONE DETECTED 05/21/2022 0910    Recent Labs  Lab 05/20/22 2315  ETH <10    I have personally reviewed the radiological images below and agree with the radiology interpretations.  MR BRAIN WO CONTRAST  Result Date: 05/22/2022 CLINICAL DATA:  Left MCA infarct EXAM: MRI HEAD WITHOUT CONTRAST MRA HEAD WITHOUT CONTRAST TECHNIQUE: Multiplanar, multi-echo pulse sequences of the brain and surrounding structures were acquired without intravenous contrast. Angiographic images of the Circle of Willis were acquired using MRA technique without intravenous contrast. COMPARISON:  None Available. FINDINGS: MRI HEAD FINDINGS Brain: Cortical diffusion restriction throughout the left MCA territory. Small focus of acute hemorrhage along the anterior surface of the left temporal lobe. Punctate focus of diffusion restriction left occipital. Normal white matter signal, parenchymal volume and CSF spaces. The midline structures are normal. Vascular: Major flow voids are preserved. Skull and upper cervical spine: Normal calvarium and skull base.  Visualized upper cervical spine and soft tissues are normal. Sinuses/Orbits:No paranasal sinus fluid levels or advanced mucosal thickening. No mastoid or middle ear effusion. Normal orbits. MRA HEAD FINDINGS POSTERIOR CIRCULATION: --Vertebral arteries: Normal --Inferior cerebellar arteries: Normal. --Basilar artery: Normal. --Superior cerebellar arteries: Normal. --Posterior cerebral arteries: Normal. ANTERIOR CIRCULATION: --Intracranial internal carotid arteries: Normal. --Anterior cerebral arteries  (ACA): Normal. --Middle cerebral arteries (MCA): Normal. ANATOMIC VARIANTS: None IMPRESSION: 1. Cortical ischemia throughout the left MCA territory compatible with recent infarct. Relatively little affect on the white matter. 2. Small focus of acute hemorrhage along the anterior surface of the left temporal lobe Heidelberg classification 3c: Subarachnoid hemorrhage. 3. Normal intracranial MRA.  Restored flow in left MCA. Electronically Signed   By: Ulyses Jarred M.D.   On: 05/22/2022 00:55   MR ANGIO HEAD WO CONTRAST  Result Date: 05/22/2022 CLINICAL DATA:  Left MCA infarct EXAM: MRI HEAD WITHOUT CONTRAST MRA HEAD WITHOUT CONTRAST TECHNIQUE: Multiplanar, multi-echo pulse sequences of the brain and surrounding structures were acquired without intravenous contrast. Angiographic images of the Circle of Willis were acquired using MRA technique without intravenous contrast. COMPARISON:  None Available. FINDINGS: MRI HEAD FINDINGS Brain: Cortical diffusion restriction throughout the left MCA territory. Small focus of acute hemorrhage along the anterior surface of the left temporal lobe. Punctate focus of diffusion restriction left occipital. Normal white matter signal, parenchymal volume and CSF spaces. The midline structures are normal. Vascular: Major flow voids are preserved. Skull and upper cervical spine: Normal calvarium and skull base. Visualized upper cervical spine and soft tissues are normal. Sinuses/Orbits:No paranasal sinus fluid levels or advanced mucosal thickening. No mastoid or middle ear effusion. Normal orbits. MRA HEAD FINDINGS POSTERIOR CIRCULATION: --Vertebral arteries: Normal --Inferior cerebellar arteries: Normal. --Basilar artery: Normal. --Superior cerebellar arteries: Normal. --Posterior cerebral arteries: Normal. ANTERIOR CIRCULATION: --Intracranial internal carotid arteries: Normal. --Anterior cerebral arteries (ACA): Normal. --Middle cerebral arteries (MCA): Normal. ANATOMIC VARIANTS: None  IMPRESSION: 1. Cortical ischemia throughout the left MCA territory compatible with recent infarct. Relatively little affect on the white matter. 2. Small focus of acute hemorrhage along the anterior surface of the left temporal lobe Heidelberg classification 3c: Subarachnoid hemorrhage. 3. Normal intracranial MRA.  Restored flow in left MCA. Electronically Signed   By: Ulyses Jarred M.D.   On: 05/22/2022 00:55   CT HEAD WO CONTRAST  Result Date: 05/22/2022 CLINICAL DATA:  24 hour follow-up after TNK administration. EXAM: CT HEAD WITHOUT CONTRAST TECHNIQUE: Contiguous axial images were obtained from the base of the skull through the vertex without intravenous contrast. RADIATION DOSE REDUCTION: This exam was performed according to the departmental dose-optimization program which includes automated exposure control, adjustment of the mA and/or kV according to patient size and/or use of iterative reconstruction technique. COMPARISON:  Head CT 05/20/2022 FINDINGS: Brain: There is now a small focus of subarachnoid hemorrhage over the superior surface of the left temporal lobe. No other site of acute hemorrhage. No midline shift or other mass effect. Slightly increased area of hypoattenuation at the left insula. Vascular: No hyperdense vessel or unexpected calcification. Skull: Normal. Negative for fracture or focal lesion. Sinuses/Orbits: No acute finding. Other: None. IMPRESSION: 1. Small focus of subarachnoid hemorrhage over the superior surface of the left temporal lobe. 2. Slightly increased area of early subacute infarct of the left insula. Critical Value/emergent results were called by telephone at the time of interpretation on 05/22/2022 at 12:05 am to provider MCNEILL [facility] , who verbally acknowledged these results. Electronically Signed   By: Ulyses Jarred  M.D.   On: 05/22/2022 00:07   VAS [place] LOWER EXTREMITY VENOUS (DVT)  Result Date: 05/21/2022  Lower Venous DVT Study Patient Name:  NATHANIAL ARRIGHI   Date of Exam:   05/21/2022 Medical Rec #: [id number]       Accession #:    [id number] Date of Birth: 03/10/1946        Patient Gender: M Patient Age:   53 years Exam Location:  [facility] Procedure:      VAS [place] LOWER EXTREMITY VENOUS (DVT) Referring Phys: Cornelius Moras Shubham Thackston --------------------------------------------------------------------------------  Indications: Embolic stroke, and Edema.  Comparison Study: No priors Performing Technologist: Velva Harman Sturdivant RDMS, RVT  Examination Guidelines: A complete evaluation includes B-mode imaging, spectral Doppler, color Doppler, and power Doppler as needed of all accessible portions of each vessel. Bilateral testing is considered an integral part of a complete examination. Limited examinations for reoccurring indications may be performed as noted. The reflux portion of the exam is performed with the patient in reverse Trendelenburg.  +---------+---------------+---------+-----------+----------+--------------+ RIGHT    CompressibilityPhasicitySpontaneityPropertiesThrombus Aging +---------+---------------+---------+-----------+----------+--------------+ CFV      Full           No       Yes                                 +---------+---------------+---------+-----------+----------+--------------+ SFJ      Full                                                        +---------+---------------+---------+-----------+----------+--------------+ FV Prox  Full                                                        +---------+---------------+---------+-----------+----------+--------------+ FV Mid   Full                                                        +---------+---------------+---------+-----------+----------+--------------+ FV DistalFull                                                        +---------+---------------+---------+-----------+----------+--------------+ PFV      Full                                                         +---------+---------------+---------+-----------+----------+--------------+ POP      Full           Yes      Yes                                 +---------+---------------+---------+-----------+----------+--------------+  PTV      Full                                                        +---------+---------------+---------+-----------+----------+--------------+ PERO     Full                                                        +---------+---------------+---------+-----------+----------+--------------+   +---------+---------------+---------+-----------+----------+--------------+ LEFT     CompressibilityPhasicitySpontaneityPropertiesThrombus Aging +---------+---------------+---------+-----------+----------+--------------+ CFV      Full           Yes      Yes                                 +---------+---------------+---------+-----------+----------+--------------+ SFJ      Full                                                        +---------+---------------+---------+-----------+----------+--------------+ FV Prox  Full                                                        +---------+---------------+---------+-----------+----------+--------------+ FV Mid   Full                                                        +---------+---------------+---------+-----------+----------+--------------+ FV DistalFull                                                        +---------+---------------+---------+-----------+----------+--------------+ PFV      Full                                                        +---------+---------------+---------+-----------+----------+--------------+ POP      Full           Yes      Yes                                 +---------+---------------+---------+-----------+----------+--------------+ PTV      Full                                                         +---------+---------------+---------+-----------+----------+--------------+  PERO     Full                                                        +---------+---------------+---------+-----------+----------+--------------+     Summary: BILATERAL: - No evidence of deep vein thrombosis seen in the lower extremities, bilaterally. -No evidence of popliteal cyst, bilaterally.   *See table(s) above for measurements and observations. Electronically signed by Deitra Mayo MD on 05/21/2022 at 5:01:56 PM.    Final    ECHOCARDIOGRAM COMPLETE  Result Date: 05/21/2022    ECHOCARDIOGRAM REPORT   Patient Name:   EMILY FORSE Date of Exam: 05/21/2022 Medical Rec #:  [id number]      Height:       67.0 in Accession #:    [id number]     Weight:       276.9 lb Date of Birth:  1945/12/02       BSA:          2.322 m Patient Age:    74 years       BP:           124/57 mmHg Patient Gender: M              HR:           64 bpm. Exam Location:  Inpatient Procedure: 2D Echo, Cardiac Doppler, Color Doppler and Intracardiac            Opacification Agent Indications:    stroke  History:        Patient has prior history of Echocardiogram examinations, most                 recent 10/21/2021. CAD; Risk Factors:Hypertension, Dyslipidemia                 and Sleep Apnea.  Sonographer:    Johny Chess RDCS Referring Phys: Clarita  Sonographer Comments: Technically difficult study due to poor echo windows. Image acquisition challenging due to patient body habitus and Image acquisition challenging due to respiratory motion. IMPRESSIONS  1. Left ventricular ejection fraction, by estimation, is 60 to 65%. The left ventricle has normal function. The left ventricle has no regional wall motion abnormalities. Left ventricular diastolic parameters are consistent with Grade I diastolic dysfunction (impaired relaxation).  2. Right ventricular systolic function is normal. The right ventricular size is normal. Tricuspid  regurgitation signal is inadequate for assessing PA pressure.  3. The mitral valve is normal in structure. No evidence of mitral valve regurgitation. No evidence of mitral stenosis.  4. The aortic valve is tricuspid. Aortic valve regurgitation is not visualized. No aortic stenosis is present.  5. The inferior vena cava is normal in size with greater than 50% respiratory variability, suggesting right atrial pressure of 3 mmHg. FINDINGS  Left Ventricle: Left ventricular ejection fraction, by estimation, is 60 to 65%. The left ventricle has normal function. The left ventricle has no regional wall motion abnormalities. Definity contrast agent was given IV to delineate the left ventricular  endocardial borders. The left ventricular internal cavity size was normal in size. There is no left ventricular hypertrophy. Left ventricular diastolic parameters are consistent with Grade I diastolic dysfunction (impaired relaxation). Normal left ventricular filling pressure. Right Ventricle: The right ventricular size is normal. No increase in right ventricular  wall thickness. Right ventricular systolic function is normal. Tricuspid regurgitation signal is inadequate for assessing PA pressure. Left Atrium: Left atrial size was normal in size. Right Atrium: Right atrial size was normal in size. Pericardium: There is no evidence of pericardial effusion. Presence of epicardial fat layer. Mitral Valve: The mitral valve is normal in structure. No evidence of mitral valve regurgitation. No evidence of mitral valve stenosis. Tricuspid Valve: The tricuspid valve is normal in structure. Tricuspid valve regurgitation is not demonstrated. No evidence of tricuspid stenosis. Aortic Valve: The aortic valve is tricuspid. Aortic valve regurgitation is not visualized. No aortic stenosis is present. Pulmonic Valve: The pulmonic valve was grossly normal. Pulmonic valve regurgitation is not visualized. No evidence of pulmonic stenosis. Aorta: The  aortic root is normal in size and structure. Venous: The inferior vena cava is normal in size with greater than 50% respiratory variability, suggesting right atrial pressure of 3 mmHg. IAS/Shunts: No atrial level shunt detected by color flow Doppler.  LEFT VENTRICLE PLAX 2D LVIDd:         5.60 cm   Diastology LVIDs:         3.20 cm   LV e' medial:    7.29 cm/s LV PW:         1.10 cm   LV E/e' medial:  8.9 LV IVS:        1.00 cm   LV e' lateral:   7.72 cm/s LVOT diam:     2.20 cm   LV E/e' lateral: 8.4 LV SV:         60 LV SV Index:   26 LVOT Area:     3.80 cm  RIGHT VENTRICLE             IVC RV S prime:     16.40 cm/s  IVC diam: 1.50 cm TAPSE (M-mode): 2.1 cm LEFT ATRIUM           Index        RIGHT ATRIUM           Index LA diam:      3.40 cm 1.46 cm/m   RA Area:     13.00 cm LA Vol (A4C): 49.0 ml 21.10 ml/m  RA Volume:   30.30 ml  13.05 ml/m  AORTIC VALVE LVOT Vmax:   77.70 cm/s LVOT Vmean:  49.100 cm/s LVOT VTI:    0.157 m  AORTA Ao Root diam: 3.40 cm MITRAL VALVE MV Area (PHT): 3.37 cm    SHUNTS MV Decel Time: 225 msec    Systemic VTI:  0.16 m MV E velocity: 64.60 cm/s  Systemic Diam: 2.20 cm MV A velocity: 74.70 cm/s MV E/A ratio:  0.86 Mihai Croitoru MD Electronically signed by Sanda Klein MD Signature Date/Time: 05/21/2022/10:55:08 AM    Final    CT ANGIO HEAD NECK W WO CM (CODE STROKE)  Result Date: 05/21/2022 CLINICAL DATA:  Acute neurologic deficit EXAM: CT ANGIOGRAPHY HEAD AND NECK TECHNIQUE: Multidetector CT imaging of the head and neck was performed using the standard protocol during bolus administration of intravenous contrast. Multiplanar CT image reconstructions and MIPs were obtained to evaluate the vascular anatomy. Carotid stenosis measurements (when applicable) are obtained utilizing NASCET criteria, using the distal internal carotid diameter as the denominator. RADIATION DOSE REDUCTION: This exam was performed according to the departmental dose-optimization program which includes  automated exposure control, adjustment of the mA and/or kV according to patient size and/or use of iterative reconstruction technique. CONTRAST:  38m  OMNIPAQUE IOHEXOL 350 MG/ML SOLN COMPARISON:  None Available. FINDINGS: CTA NECK FINDINGS SKELETON: There is no bony spinal canal stenosis. No lytic or blastic lesion. OTHER NECK: Normal pharynx, larynx and major salivary glands. No cervical lymphadenopathy. Unremarkable thyroid gland. UPPER CHEST: No pneumothorax or pleural effusion. No nodules or masses. AORTIC ARCH: There is calcific atherosclerosis of the aortic arch. There is no aneurysm, dissection or hemodynamically significant stenosis of the visualized portion of the aorta. Conventional 3 vessel aortic branching pattern. The visualized proximal subclavian arteries are widely patent. RIGHT CAROTID SYSTEM: No dissection, occlusion or aneurysm. There is mixed density atherosclerosis extending into the proximal ICA, resulting in less than 50% stenosis. LEFT CAROTID SYSTEM: No dissection, occlusion or aneurysm. There is mixed density atherosclerosis extending into the proximal ICA, resulting in less than 50% stenosis. VERTEBRAL ARTERIES: Codominant configuration. Both origins are clearly patent. There is no dissection, occlusion or flow-limiting stenosis to the skull base (V1-V3 segments). CTA HEAD FINDINGS POSTERIOR CIRCULATION: --Vertebral arteries: Normal V4 segments. --Inferior cerebellar arteries: Normal. --Basilar artery: Normal. --Superior cerebellar arteries: Normal. --Posterior cerebral arteries (PCA): Normal. ANTERIOR CIRCULATION: --Intracranial internal carotid arteries: With atherosclerotic calcification within both internal carotid arteries at the skull base. The terminus of the left ICA is occluded. --Anterior cerebral arteries (ACA): Normal. Both A1 segments are present. Patent anterior communicating artery (a-comm). --Middle cerebral arteries (MCA): There is occlusion of the left MCA with minimal  distal collateralization. The right MCA is normal. VENOUS SINUSES: As permitted by contrast timing, patent. ANATOMIC VARIANTS: None Review of the MIP images confirms the above findings. IMPRESSION: 1. 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 2. Bilateral carotid bifurcation atherosclerosis without hemodynamically significant stenosis. Critical Value/emergent results were called by telephone at the time of interpretation on 05/21/2022 at 12:00 am to provider MCNEILL [facility] , who verbally acknowledged these results. Aortic atherosclerosis (ICD10-I70.0). Electronically Signed   By: Ulyses Jarred M.D.   On: 05/21/2022 00:02   CT HEAD CODE STROKE WO CONTRAST  Result Date: 05/20/2022 CLINICAL DATA:  Code stroke.  Acute neurologic deficit EXAM: CT HEAD WITHOUT CONTRAST TECHNIQUE: Contiguous axial images were obtained from the base of the skull through the vertex without intravenous contrast. RADIATION DOSE REDUCTION: This exam was performed according to the departmental dose-optimization program which includes automated exposure control, adjustment of the mA and/or kV according to patient size and/or use of iterative reconstruction technique. COMPARISON:  None Available. FINDINGS: Brain: There is no mass, hemorrhage or extra-axial collection. The size and configuration of the ventricles and extra-axial CSF spaces are normal. The brain parenchyma is normal, without evidence of acute or chronic infarction. Cortical calcification left paracentral. Vascular: Atherosclerotic calcification of the internal carotid arteries at the skull base. No abnormal hyperdensity of the major intracranial arteries or dural venous sinuses. Skull: The visualized skull base, calvarium and extracranial soft tissues are normal. Sinuses/Orbits: No fluid levels or advanced mucosal thickening of the visualized paranasal sinuses. No mastoid or middle ear effusion. The orbits are  normal. ASPECTS [facility] Stroke Program Early CT Score) - Ganglionic level infarction (caudate, lentiform nuclei, internal capsule, insula, M1-M3 cortex): 7 - Supraganglionic infarction (M4-M6 cortex): 3 Total score (0-10 with 10 being normal): 10 IMPRESSION: 1. No acute intracranial abnormality. 2. ASPECTS is 10 These results were communicated to Dr. Roland Rack at 11:33 pm on 05/20/2022 by text page via the [facility] messaging system. Electronically Signed   By: Cletus Gash.D.  On: 05/20/2022 23:34    PHYSICAL EXAM  Temp:  [98 F (36.7 C)-99.1 F (37.3 C)] 98.1 F (36.7 C) (10/04 0805) Pulse Rate:  [56-73] 60 (10/04 0700) Resp:  [13-26] 19 (10/04 0700) BP: (90-168)/(47-104) 154/66 (10/04 0700) SpO2:  [93 %-100 %] 98 % (10/04 0700) Arterial Line BP: (130-182)/(48-76) 130/76 (10/04 0300)  General - Well nourished, well developed, in no apparent distress. Respiratory- SpO2 98% on room air, non-labored breathing, no audible wheezing Cardiovascular - Regular rhythm and rate.  Neuro - awake, alert, eyes open, expressive aphasia, says "yes" to all examiner questions, not able to answer all patient questions. Will follow some simple commands, however not two-step commands. Does not mimic.  Not able to name or repeat. No gaze palsy, tracking bilaterally, visual fields full.  Mild left facial droop. Tongue midline. RUE 4/5 and slight drift. BLE 4/5 with slightly decreased RLE elevation compared to the left. Sensation, coordination not corporative and gait not tested.  ASSESSMENT/PLAN Mr. CHAMPION CORALES is a 76 y.o. male with history of hypertension, hyperlipidemia, CAD status post stent, OSA, obesity admitted for right-sided weakness and aphasia.  TNK given.  Status post IR for left terminal ICA and A1 and M1 occlusion.  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 Per Dr. Estanislado Pandy, clot felt to be cardioembolic source instead of ICAD MRI cortical ischemia throughout left MCA territory compatible with recent infarction. Small focus of acute hemorrhage along the anterior surface of the left temporal lobe Heidelberg classification 3c: Subarachnoid hemorrhage. MRA normal with restored flow in the left MCA 2D Echo EF 60 to 65% LE venous Doppler no DVT Recommend loop recorder on discharge to rule out A-fib LDL 56 HgbA1c 6.0 UDS negative SCDs VTE prophylaxis aspirin 81 mg daily and Brilinta (ticagrelor) 90 mg bid prior to admission, on ASA and brilinta home meds.  Ongoing aggressive stroke risk factor management Therapy recommendations: outpatient PT  Disposition: Pending  Hypertension Home meds: Coreg Stable on the high end Off Cleviprex Resume home coreg BP goal SBP < 180 / 105 PRN Hydralazine available for >180/105 Long term BP goal normotensive  Hyperlipidemia Home meds: Lipitor 80 and Zetia LDL 56, goal < 70 Now on Lipitor 80 and Zetia Continue statin at discharge  Other Stroke Risk Factors Advanced age Obesity, Body mass index is 43.37 kg/m.  Coronary artery disease status post stent Obstructive sleep apnea  Hospital day # 1  Anibal Henderson, AGACNP-BC [facility] Pager: [phone]  ATTENDING NOTE: I reviewed above note and agree with the assessment and plan. Pt was seen and examined.   Wife at bedside.  Patient lying in bed, no distress.  Still has expressive aphasia, not able to name or repeat, however follows most simple commands, not two-step commands though.  Moving all extremities, right upper extremity only slight drift, improved from yesterday.  Patient apparently did well with PT/OT and now recommend outpatient PT/OT.  BP stable on the high end, off Cleviprex,  resume home coreg.  Resume home aspirin and Brilinta.  Continue statin and Zetia.  Continue speech therapy.  For detailed assessment and plan, please refer to above/below as I have made changes wherever appropriate.   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 This patient's care requires constant monitoring of vital signs, hemodynamics, respiratory and cardiac monitoring, review of multiple databases, neurological assessment, discussion with family, other specialists and medical decision making of high complexity. I spent 35 minutes of neurocritical care time in the care of this patient. I had long discussion with wife at bedside, updated pt current condition, treatment plan and potential prognosis, and answered all the questions.  She expressed understanding and appreciation.   To contact Stroke Continuity provider, please refer to [email or web address] After hours, contact General Neurology

## 2022-05-22 NOTE — Progress Notes (Signed)
Paged Neurology MD for patients SBP sustaining in the 150's. Current SBP goal is 120-140. Patient was weaned off of Cleviprex gtt on day shift. Currently does not have any PRN orders other than labetalol for SBP >180. New SBP goal 120-160 per Neurology MD.

## 2022-05-22 NOTE — Progress Notes (Signed)
While resting patient bradying into low 40's. On call MD notified.

## 2022-05-22 NOTE — Evaluation (Signed)
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   Clinical Impression   This 76 yo male admitted and underwent above presents to acute OT with PLOF of being totally independent with basic ADLs and driving. He currently is setup/S-min guard A for basic ADLs and ambulation with these tasks with biggest concern for receptive and expressive difficulties. He will continue to benefit from acute OT with follow up OPOT.      Recommendations for follow up therapy are one component of a multi-disciplinary discharge planning process, led by the attending physician.  Recommendations may be updated based on patient status, additional functional criteria and insurance authorization.   Follow Up Recommendations  Outpatient OT    Assistance Recommended at Discharge Frequent or constant Supervision/Assistance  Patient can return home with the following A little help with walking and/or transfers;A little help with bathing/dressing/bathroom;Help with stairs or ramp for entrance;Assistance with cooking/housework;Assist for transportation;Direct supervision/assist for financial management;Direct supervision/assist for medications management    Functional Status Assessment  Patient has had a recent decline in their functional status and demonstrates the ability to make significant improvements in function in a reasonable and predictable amount of time.  Equipment Recommendations  None recommended by OT       Precautions / Restrictions Precautions Precautions: Fall Restrictions Weight Bearing Restrictions: No      Mobility Bed Mobility               General bed mobility  comments: pt sitting on side of bed finishing up with PT    Transfers Overall transfer level: Needs assistance Equipment used: None Transfers: Sit to/from Stand Sit to Stand: Min guard                  Balance Overall balance assessment: Needs assistance Sitting-balance support: No upper extremity supported, Feet supported Sitting balance-Leahy Scale: Good     Standing balance support: No upper extremity supported Standing balance-Leahy Scale: Fair                             ADL either performed or assessed with clinical judgement   ADL Overall ADL's : Needs assistance/impaired Eating/Feeding: Independent;Set up;Sitting   Grooming: Min guard;Standing   Upper Body Bathing: Set up;Sitting   Lower Body Bathing: Min guard;Sit to/from stand   Upper Body Dressing : Set up;Supervision/safety;Sitting   Lower Body Dressing: Min guard;Sit to/from stand   Toilet Transfer: Min guard;Ambulation;Regular Toilet;Grab bars   Toileting- Clothing Manipulation and Hygiene: Minimal assistance;Sit to/from stand               Vision Patient Visual Report: No change from baseline              Pertinent Vitals/Pain Pain Assessment Pain Assessment: No/denies pain     Hand Dominance Right   Extremity/Trunk Assessment Upper Extremity Assessment Upper Extremity Assessment: Overall WFL for tasks assessed           Communication Communication Communication: Expressive difficulties;Receptive difficulties   Cognition Arousal/Alertness: Awake/alert Behavior During Therapy: WFL for tasks assessed/performed Overall Cognitive Status: Difficult to assess  General Comments: Can answer yes/no questions with 50-75% accuracy sometime self correcting; but cannot answer open ended questions                Home Living Family/patient expects to be discharged to:: Private residence Living Arrangements:  Spouse/significant other Available Help at Discharge: Family;Available 24 hours/day (initally) Type of Home: House Home Access: Level entry     Home Layout: One level     Bathroom Shower/Tub: Occupational psychologist: Standard     Home Equipment: Shower seat - built in      Lives With: Spouse    Prior Functioning/Environment Prior Level of Function : Independent/Modified Independent                        OT Problem List: Impaired balance (sitting and/or standing);Obesity;Decreased cognition      OT Treatment/Interventions: Self-care/ADL training;DME and/or AE instruction;Patient/family education;Balance training    OT Goals(Current goals can be found in the care plan section) Acute Rehab OT Goals Patient Stated Goal: to continue therapy (wife) OT Goal Formulation: With patient/family Time For Goal Achievement: 06/05/22 Potential to Achieve Goals: Good  OT Frequency: Min 2X/week       AM-PAC OT "6 Clicks" Daily Activity     Outcome Measure Help from another person eating meals?: None Help from another person taking care of personal grooming?: A Little Help from another person toileting, which includes using toliet, bedpan, or urinal?: A Little Help from another person bathing (including washing, rinsing, drying)?: A Little Help from another person to put on and taking off regular upper body clothing?: A Little Help from another person to put on and taking off regular lower body clothing?: A Little 6 Click Score: 19   End of Session Nurse Communication:  (RN aware of pt ambulating around the unit with PT)  Activity Tolerance: Patient tolerated treatment well Patient left: in bed;with call bell/phone within reach;with bed alarm set;with nursing/sitter in room;with family/visitor present  OT Visit Diagnosis: Unsteadiness on feet (R26.81);Cognitive communication deficit (R41.841) Symptoms and signs involving cognitive functions: Cerebral infarction                 Time: 8850-2774 OT Time Calculation (min): 28 min Charges:  OT General Charges $OT Visit: 1 Visit OT Evaluation $OT Eval Moderate Complexity: [place], OTR/L Acute [employer] [facility] [phone] Office [phone]    Almon Register 05/22/2022, 4:00 PM

## 2022-05-22 NOTE — Progress Notes (Signed)
Speech Language Pathology Treatment: Dysphagia;Cognitive-Linquistic  Patient Details Name: Alan Stewart MRN: [id number] DOB: 01/10/1946 Today's Date: 05/22/2022 Time: 1120-1140 SLP Time Calculation (min) (ACUTE ONLY): 20 min  Assessment / Plan / Recommendation Clinical Impression  Patient seen by SLP for skilled treatment session focused on aphasia and swallow function goals. Spouse was in room for portion of session. Patient exhibiting more frequent verbalizing at one-word level but for more automatic speech, such as "yeah", "no",  "okay". He verbalized "yeah" and "no" to respond to RN and SLP and was able to confirm responses when asked again. He was using gestures such as pointing into hallway when trying to tell SLP and wife about MRI from last night. Continues to be mod-severe expressive aphasia but receptive aphasia appears mild. He continues to not exhibit frustration despite significant difficulty expressing his thoughts.  SLP noticed that patient hadnt eaten much of his breakfast. He denied that the Dys 2 texture influenced his appetite but SLP unsure. SLP observed him with Cheerios and milk and he was able to open cereal package and feed self, exhibiting no overt s/s of oral or pharyngeal phase swallow difficulties. SLP upgraded patient's diet to Dys 3 solids, continue with thin liquids.Marland Kitchen  SLP will continue to follow acutely for aphasia and dysphagia treatment. He is at too high a level for PT or OT at [facility] and so their recommendations are for OP therapies. SLP has changed recommendations to OP to reflect this change.    HPI HPI: Patient is a 76 y.o. male with PMH: HTN, CAD, HLD, osteoarthritis, SCC, sleep apnea. He presented to the hospital on 05/20/22 with right sided weakness and aphasia with LKW reported by wife to be 10pm on 10/2 when he came to bed and she observed him seeming to be "off". He was  not able to speak and wife then called 911. He arrived at the hospital as code stroke and  taken for emergent CT/CTA which demonstrated left M1 occlusion. He received IV tenecteplase and IR thrombectomy due to large vessel occlusion.      SLP Plan  Continue with current plan of care      Recommendations for follow up therapy are one component of a multi-disciplinary discharge planning process, led by the attending physician.  Recommendations may be updated based on patient status, additional functional criteria and insurance authorization.    Recommendations  Diet recommendations: Dysphagia 3 (mechanical soft);Thin liquid Liquids provided via: Cup;Straw Medication Administration: Whole meds with liquid Supervision: Patient able to self feed;Intermittent supervision to cue for compensatory strategies Compensations: Slow rate;Small sips/bites Postural Changes and/or Swallow Maneuvers: Seated upright 90 degrees                Oral Care Recommendations: Oral care BID Follow Up Recommendations: Outpatient SLP Assistance recommended at discharge: Intermittent Supervision/Assistance SLP Visit Diagnosis: Dysphagia, unspecified (R13.10);Aphasia (R47.01) Plan: Continue with current plan of care         Sonia Baller, MA, CCC-SLP Speech Therapy

## 2022-05-23 ENCOUNTER — Ambulatory Visit: Payer: Medicare HMO | Admitting: Dermatology

## 2022-05-23 ENCOUNTER — Encounter (HOSPITAL_COMMUNITY)
Admission: EM | Disposition: A | Discharge status: Home / Self Care | Payer: Self-pay | Source: Home / Self Care | Attending: Neurology

## 2022-05-23 DIAGNOSIS — Z95818 Presence of other cardiac implants and grafts: Secondary | ICD-10-CM

## 2022-05-23 DIAGNOSIS — I6602 Occlusion and stenosis of left middle cerebral artery: Secondary | ICD-10-CM

## 2022-05-23 DIAGNOSIS — Z6841 Body Mass Index (BMI) 40.0 and over, adult: Secondary | ICD-10-CM

## 2022-05-23 DIAGNOSIS — I63412 Cerebral infarction due to embolism of left middle cerebral artery: Secondary | ICD-10-CM | POA: Diagnosis not present

## 2022-05-23 DIAGNOSIS — I10 Essential (primary) hypertension: Secondary | ICD-10-CM

## 2022-05-23 DIAGNOSIS — I25119 Atherosclerotic heart disease of native coronary artery with unspecified angina pectoris: Secondary | ICD-10-CM

## 2022-05-23 DIAGNOSIS — G4733 Obstructive sleep apnea (adult) (pediatric): Secondary | ICD-10-CM

## 2022-05-23 DIAGNOSIS — E7800 Pure hypercholesterolemia, unspecified: Secondary | ICD-10-CM | POA: Diagnosis not present

## 2022-05-23 HISTORY — PX: LOOP RECORDER INSERTION: EP1214

## 2022-05-23 HISTORY — DX: Presence of other cardiac implants and grafts: Z95.818

## 2022-05-23 LAB — GLUCOSE, CAPILLARY
Glucose-Capillary: 108 mg/dL — ABNORMAL HIGH (ref 70–99)
Glucose-Capillary: 113 mg/dL — ABNORMAL HIGH (ref 70–99)
Glucose-Capillary: 111 mg/dL — ABNORMAL HIGH (ref 70–99)
Glucose-Capillary: 100 mg/dL — ABNORMAL HIGH (ref 70–99)

## 2022-05-23 SURGERY — LOOP RECORDER INSERTION

## 2022-05-23 MED ORDER — LIDOCAINE-EPINEPHRINE 1 %-1:100000 IJ SOLN
INTRAMUSCULAR | Status: DC | PRN
  Administered 2022-05-23: 20 mL

## 2022-05-23 MED ORDER — LIDOCAINE-EPINEPHRINE 1 %-1:100000 IJ SOLN
INTRAMUSCULAR | Status: AC
  Filled 2022-05-23: qty 1

## 2022-05-23 SURGICAL SUPPLY — 3 items
BIOMONITOR IIIM RECORDER (Prosthesis & Implant Heart) ×1 IMPLANT
MONITOR BIO IIIM RECORDER (Prosthesis & Implant Heart) IMPLANT
PACK LOOP INSERTION (CUSTOM PROCEDURE TRAY) ×1 IMPLANT

## 2022-05-23 NOTE — Discharge Summary (Addendum)
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 Swallow whole.   atorvastatin 80 MG tablet Commonly known as: LIPITOR Take 1 tablet (80 mg total) by mouth daily.   carvedilol 6.25 MG tablet Commonly known as: COREG Take 1 tablet (6.25 mg total) by mouth 2 (two) times daily with a meal.   ezetimibe 10 MG tablet Commonly known as: ZETIA Take 1 tablet (10 mg total) by mouth daily.   levothyroxine 75 MCG tablet Commonly known as: SYNTHROID Take 75 mcg by mouth daily before breakfast.   nitroGLYCERIN 0.4 MG SL tablet Commonly known as: NITROSTAT Place 1 tablet (0.4 mg total) under the tongue every 5 (five) minutes as needed for chest pain.   ticagrelor 90 MG Tabs tablet Commonly known as: BRILINTA Take 1 tablet (90 mg total) by mouth 2 (two) times daily.        LABORATORY STUDIES CBC    Component Value Date/Time   WBC 7.2 05/21/2022 0433   RBC 4.42 05/21/2022 0433   HGB 13.4 05/21/2022 0433   HCT 39.8 05/21/2022 0433   PLT 207 05/21/2022 0433   MCV 90.0 05/21/2022 0433   MCH 30.3 05/21/2022 0433   MCHC 33.7 05/21/2022 0433   RDW 14.2 05/21/2022 0433   LYMPHSABS 0.8  05/21/2022 0433   MONOABS 0.4 05/21/2022 0433   EOSABS 0.0 05/21/2022 0433   BASOSABS 0.0 05/21/2022 0433   CMP    Component Value Date/Time   NA 138 05/21/2022 0433   K 4.0 05/21/2022 0433   CL 110 05/21/2022 0433   CO2 22 05/21/2022 0433   GLUCOSE 195 (H) 05/21/2022 0433   BUN 15 05/21/2022 0433   CREATININE 1.08 05/21/2022 0433   CALCIUM 8.3 (L) 05/21/2022 0433   PROT 6.1 (L) 05/20/2022 2315   ALBUMIN 3.3 (L) 05/20/2022 2315   AST 29 05/20/2022 2315   ALT 30 05/20/2022 2315   ALKPHOS 105 05/20/2022 2315   BILITOT 0.6 05/20/2022 2315   GFRNONAA >60 05/21/2022 0433   GFRAA >60 05/29/2017 1016   COAGS Lab Results  Component Value Date   INR 1.0 05/20/2022   INR 1.0 10/20/2021   INR 0.9 11/16/2008   Lipid Panel    Component Value Date/Time   CHOL 106 05/21/2022 0433   TRIG 83 05/21/2022 0433   HDL 33 (L) 05/21/2022 0433   CHOLHDL 3.2 05/21/2022 0433   VLDL 17 05/21/2022 0433   LDLCALC 56 05/21/2022 0433   HgbA1C  Lab Results  Component Value Date   HGBA1C 6.0 (H) 05/21/2022   Urinalysis    Component Value Date/Time  COLORURINE AMBER (A) 05/29/2017 1123   APPEARANCEUR CLEAR (A) 05/29/2017 1123   LABSPEC 1.020 05/29/2017 1123   PHURINE 5.0 05/29/2017 1123   GLUCOSEU NEGATIVE 05/29/2017 1123   HGBUR NEGATIVE 05/29/2017 1123   BILIRUBINUR NEGATIVE 05/29/2017 1123   KETONESUR 20 (A) 05/29/2017 1123   PROTEINUR 30 (A) 05/29/2017 1123   UROBILINOGEN 1.0 11/16/2008 1531   NITRITE NEGATIVE 05/29/2017 1123   LEUKOCYTESUR NEGATIVE 05/29/2017 1123   Urine Drug Screen     Component Value Date/Time   LABOPIA NONE DETECTED 05/21/2022 0910   COCAINSCRNUR NONE DETECTED 05/21/2022 0910   LABBENZ NONE DETECTED 05/21/2022 0910   AMPHETMU NONE DETECTED 05/21/2022 0910   THCU NONE DETECTED 05/21/2022 0910   LABBARB NONE DETECTED 05/21/2022 0910    Alcohol Level    Component Value Date/Time   ETH <10 05/20/2022 2315     SIGNIFICANT DIAGNOSTIC STUDIES IR  PERCUTANEOUS ART THROMBECTOMY/INFUSION INTRACRANIAL INC DIAG ANGIO  Result Date: 05/22/2022 INDICATION: New onset right-sided weakness, global aphasia and right-sided neglect. EXAM: 1. EMERGENT LARGE VESSEL OCCLUSION THROMBOLYSIS (anterior circulation) COMPARISON:  CT angiogram of the head and neck of 05/20/2022. MEDICATIONS: Ancef 2 g IV for antibiotic was administered within 1 hour of the procedure. ANESTHESIA/SEDATION: General anesthesia. CONTRAST:  Omnipaque 300 75 mL. FLUOROSCOPY TIME:  Fluoroscopy Time: 16 minutes 54 seconds (1247 mGy). COMPLICATIONS: None immediate. TECHNIQUE: Following a full explanation of the procedure along with the potential associated complications, an informed witnessed consent was obtained. The risks of intracranial hemorrhage of 10%, worsening neurological deficit, ventilator dependency, death and inability to revascularize were all reviewed in detail with the patient's spouse. The patient was then put under general anesthesia by the Department of Anesthesiology at [facility]. The right groin was prepped and draped in the usual sterile fashion. Thereafter using modified Seldinger technique, transfemoral access into the right common femoral artery was obtained without difficulty. Over a 0.035 inch guidewire an 8 French 25 cm Pinnacle sheath was inserted. Through this, and also over a 0.035 inch guidewire a combination of a Simmons 2 support catheter inside of a 95 cm 087 balloon guide catheter was advanced to the aortic arch as evidenced in the left internal carotid artery bulb without evidence ulcerations arch region, and selectively positioned in the left common carotid artery, and then advanced to the distal cervical left ICA. The guidewire was removed. Good aspiration obtained from the hub of the balloon guide catheter. Gentle control arteriogram through this demonstrated no evidence of vasospasm, dissections or intraluminal filling defects. FINDINGS: The left common  carotid arteriogram demonstrates the left external carotid artery and its major branches to be widely patent. The left internal carotid artery at the bulb to the cranial skull base demonstrates patency with gradual reduced caliber at the skull base. Nonocclusive atherosclerotic plaque noted in the left carotid bulb. 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 Patency of the ipsilateral ophthalmic artery noted. PROCEDURE: Through the balloon guide catheter in the cervical petrous junction, over an 014 inch Softip Aristotle micro guidewire with a moderate J configuration, an 021 160 cm microcatheter inside of a 132 cm Zoom aspiration catheter combination was advanced initially in the proximal supraclinoid segment, and then using a torque device access into the left middle cerebral artery inferior division M2 region was obtained with the micro guidewire followed by the microcatheter. The Zoom aspiration catheter was advanced to  the region of the mid M1 segment. The micro guidewire was removed. Good aspiration was obtained from the hub of the microcatheter. A gentle control arteriogram performed through this demonstrates safe positioning of the tip of the microcatheter. This was then connected to continuous heparinized saline infusion. A 4 mm x 40 mm Solitaire X retrieval device was then advanced to the distal end of the microcatheter, and the retriever was deployed in the usual fashion. Contact aspiration had already been started through the Zoom aspiration catheter in the occluded M1 segment. With proximal flow arrest in the left internal carotid artery, and contact aspiration the Zoom aspiration catheter, for 2 minutes, the combination of the retrieval device, the microcatheter and the Zoom aspiration catheter were retrieved and removed. Reversal of flow arrest, a control arteriogram via  balloon guide catheter demonstrated complete revascularization of the supraclinoid left ICA, the left anterior cerebral artery and the left MCA distribution achieving a TICI 3 revascularization. No evidence of filling defects was noted. Moderate spasm in the left M1 segment responded to 25 mcg of nitroglycerin intra-arterially. The balloon guide catheter was retrieved and removed. An 8 French Angio-Seal closure device was then deployed for hemostasis at the right groin puncture site. Distal pulses remained Dopplerable in both feet unchanged. A flat panel CT of the brain demonstrated no evidence of hemorrhagic complications or mass effect. Patient's general anesthesia was reversed and patient was extubated. Upon recovery, the patient was able to move his left side spontaneously and able to bend his right knee. No spontaneous movement was evident in the right upper extremity. Patient continued to have comprehension difficulties. He was then transferred to the medical ICU for post revascularization care. IMPRESSION: Status post complete revascularization of the left internal carotid artery terminus occlusion, the left middle cerebral artery and the left anterior cerebral artery distribution with 1 pass with a 4 mm x 40 mm Solitaire X retrieval device and contact aspiration achieving a TICI 3 revascularization. Time from groin puncture to complete revascularization approximately 15 minutes. PLAN: Follow-up as per referring MD. Electronically Signed   By: Luanne Bras M.D.   On: 05/22/2022 10:33   IR CT Head Ltd  Result Date: 05/22/2022 INDICATION: New onset right-sided weakness, global aphasia and right-sided neglect. EXAM: 1. EMERGENT LARGE VESSEL OCCLUSION THROMBOLYSIS (anterior circulation) COMPARISON:  CT angiogram of the head and neck of 05/20/2022. MEDICATIONS: Ancef 2 g IV for antibiotic was administered within 1 hour of the procedure. ANESTHESIA/SEDATION: General anesthesia. CONTRAST:  Omnipaque 300 75  mL. FLUOROSCOPY TIME:  Fluoroscopy Time: 16 minutes 54 seconds (1247 mGy). COMPLICATIONS: None immediate. TECHNIQUE: Following a full explanation of the procedure along with the potential associated complications, an informed witnessed consent was obtained. The risks of intracranial hemorrhage of 10%, worsening neurological deficit, ventilator dependency, death and inability to revascularize were all reviewed in detail with the patient's spouse. The patient was then put under general anesthesia by the Department of Anesthesiology at [facility]. The right groin was prepped and draped in the usual sterile fashion. Thereafter using modified Seldinger technique, transfemoral access into the right common femoral artery was obtained without difficulty. Over a 0.035 inch guidewire an 8 French 25 cm Pinnacle sheath was inserted. Through this, and also over a 0.035 inch guidewire a combination of a Simmons 2 support catheter inside of a 95 cm 087 balloon guide catheter was advanced to the aortic arch as evidenced in the left internal carotid artery bulb without evidence ulcerations arch region, and selectively  positioned in the left common carotid artery, and then advanced to the distal cervical left ICA. The guidewire was removed. Good aspiration obtained from the hub of the balloon guide catheter. Gentle control arteriogram through this demonstrated no evidence of vasospasm, dissections or intraluminal filling defects. FINDINGS: The left common carotid arteriogram demonstrates the left external carotid artery and its major branches to be widely patent. The left internal carotid artery at the bulb to the cranial skull base demonstrates patency with gradual reduced caliber at the skull base. Nonocclusive atherosclerotic plaque noted in the left carotid bulb. More distally patency is maintained of the petrous, cavernous and the supraclinoid left ICA with a complete occlusion noted at the origin of the anterior  choroidal artery. Patency of the ipsilateral ophthalmic artery noted. PROCEDURE: Through the balloon guide catheter in the cervical petrous junction, over an 014 inch Softip Aristotle micro guidewire with a moderate J configuration, an 021 160 cm microcatheter inside of a 132 cm Zoom aspiration catheter combination was advanced initially in the proximal supraclinoid segment, and then using a torque device access into the left middle cerebral artery inferior division M2 region was obtained with the micro guidewire followed by the microcatheter. The Zoom aspiration catheter was advanced to the region of the mid M1 segment. The micro guidewire was removed. Good aspiration was obtained from the hub of the microcatheter. A gentle control arteriogram performed through this demonstrates safe positioning of the tip of the microcatheter. This was then connected to continuous heparinized saline infusion. A 4 mm x 40 mm Solitaire X retrieval device was then advanced to the distal end of the microcatheter, and the retriever was deployed in the usual fashion. Contact aspiration had already been started through the Zoom aspiration catheter in the occluded M1 segment. With proximal flow arrest in the left internal carotid artery, and contact aspiration the Zoom aspiration catheter, for 2 minutes, the combination of the retrieval device, the microcatheter and the Zoom aspiration catheter were retrieved and removed. Reversal of flow arrest, a control arteriogram via balloon guide catheter demonstrated complete revascularization of the supraclinoid left ICA, the left anterior cerebral artery and the left MCA distribution achieving a TICI 3 revascularization. No evidence of filling defects was noted. Moderate spasm in the left M1 segment responded to 25 mcg of nitroglycerin intra-arterially. The balloon guide catheter was retrieved and removed. An 8 French Angio-Seal closure device was then deployed for hemostasis at the right groin  puncture site. Distal pulses remained Dopplerable in both feet unchanged. A flat panel CT of the brain demonstrated no evidence of hemorrhagic complications or mass effect. Patient's general anesthesia was reversed and patient was extubated. Upon recovery, the patient was able to move his left side spontaneously and able to bend his right knee. No spontaneous movement was evident in the right upper extremity. Patient continued to have comprehension difficulties. He was then transferred to the medical ICU for post revascularization care. IMPRESSION: Status post complete revascularization of the left internal carotid artery terminus occlusion, the left middle cerebral artery and the left anterior cerebral artery distribution with 1 pass with a 4 mm x 40 mm Solitaire X retrieval device and contact aspiration achieving a TICI 3 revascularization. Time from groin puncture to complete revascularization approximately 15 minutes. PLAN: Follow-up as per referring MD. Electronically Signed   By: Luanne Bras M.D.   On: 05/22/2022 10:33   MR BRAIN WO CONTRAST  Result Date: 05/22/2022 CLINICAL DATA:  Left MCA infarct EXAM: MRI HEAD WITHOUT  CONTRAST MRA HEAD WITHOUT CONTRAST TECHNIQUE: Multiplanar, multi-echo pulse sequences of the brain and surrounding structures were acquired without intravenous contrast. Angiographic images of the Circle of Willis were acquired using MRA technique without intravenous contrast. COMPARISON:  None Available. FINDINGS: MRI HEAD FINDINGS Brain: Cortical diffusion restriction throughout the left MCA territory. Small focus of acute hemorrhage along the anterior surface of the left temporal lobe. Punctate focus of diffusion restriction left occipital. Normal white matter signal, parenchymal volume and CSF spaces. The midline structures are normal. Vascular: Major flow voids are preserved. Skull and upper cervical spine: Normal calvarium and skull base. Visualized upper cervical spine and  soft tissues are normal. Sinuses/Orbits:No paranasal sinus fluid levels or advanced mucosal thickening. No mastoid or middle ear effusion. Normal orbits. MRA HEAD FINDINGS POSTERIOR CIRCULATION: --Vertebral arteries: Normal --Inferior cerebellar arteries: Normal. --Basilar artery: Normal. --Superior cerebellar arteries: Normal. --Posterior cerebral arteries: Normal. ANTERIOR CIRCULATION: --Intracranial internal carotid arteries: Normal. --Anterior cerebral arteries (ACA): Normal. --Middle cerebral arteries (MCA): Normal. ANATOMIC VARIANTS: None IMPRESSION: 1. Cortical ischemia throughout the left MCA territory compatible with recent infarct. Relatively little affect on the white matter. 2. Small focus of acute hemorrhage along the anterior surface of the left temporal lobe Heidelberg classification 3c: Subarachnoid hemorrhage. 3. Normal intracranial MRA.  Restored flow in left MCA. Electronically Signed   By: Ulyses Jarred M.D.   On: 05/22/2022 00:55   MR ANGIO HEAD WO CONTRAST  Result Date: 05/22/2022 CLINICAL DATA:  Left MCA infarct EXAM: MRI HEAD WITHOUT CONTRAST MRA HEAD WITHOUT CONTRAST TECHNIQUE: Multiplanar, multi-echo pulse sequences of the brain and surrounding structures were acquired without intravenous contrast. Angiographic images of the Circle of Willis were acquired using MRA technique without intravenous contrast. COMPARISON:  None Available. FINDINGS: MRI HEAD FINDINGS Brain: Cortical diffusion restriction throughout the left MCA territory. Small focus of acute hemorrhage along the anterior surface of the left temporal lobe. Punctate focus of diffusion restriction left occipital. Normal white matter signal, parenchymal volume and CSF spaces. The midline structures are normal. Vascular: Major flow voids are preserved. Skull and upper cervical spine: Normal calvarium and skull base. Visualized upper cervical spine and soft tissues are normal. Sinuses/Orbits:No paranasal sinus fluid levels or  advanced mucosal thickening. No mastoid or middle ear effusion. Normal orbits. MRA HEAD FINDINGS POSTERIOR CIRCULATION: --Vertebral arteries: Normal --Inferior cerebellar arteries: Normal. --Basilar artery: Normal. --Superior cerebellar arteries: Normal. --Posterior cerebral arteries: Normal. ANTERIOR CIRCULATION: --Intracranial internal carotid arteries: Normal. --Anterior cerebral arteries (ACA): Normal. --Middle cerebral arteries (MCA): Normal. ANATOMIC VARIANTS: None IMPRESSION: 1. Cortical ischemia throughout the left MCA territory compatible with recent infarct. Relatively little affect on the white matter. 2. Small focus of acute hemorrhage along the anterior surface of the left temporal lobe Heidelberg classification 3c: Subarachnoid hemorrhage. 3. Normal intracranial MRA.  Restored flow in left MCA. Electronically Signed   By: Ulyses Jarred M.D.   On: 05/22/2022 00:55   CT HEAD WO CONTRAST  Result Date: 05/22/2022 CLINICAL DATA:  24 hour follow-up after TNK administration. EXAM: CT HEAD WITHOUT CONTRAST TECHNIQUE: Contiguous axial images were obtained from the base of the skull through the vertex without intravenous contrast. RADIATION DOSE REDUCTION: This exam was performed according to the departmental dose-optimization program which includes automated exposure control, adjustment of the mA and/or kV according to patient size and/or use of iterative reconstruction technique. COMPARISON:  Head CT 05/20/2022 FINDINGS: Brain: There is now a small focus of subarachnoid hemorrhage over the superior surface of the left temporal lobe. No other  site of acute hemorrhage. No midline shift or other mass effect. Slightly increased area of hypoattenuation at the left insula. Vascular: No hyperdense vessel or unexpected calcification. Skull: Normal. Negative for fracture or focal lesion. Sinuses/Orbits: No acute finding. Other: None. IMPRESSION: 1. Small focus of subarachnoid hemorrhage over the superior surface of  the left temporal lobe. 2. Slightly increased area of early subacute infarct of the left insula. Critical Value/emergent results were called by telephone at the time of interpretation on 05/22/2022 at 12:05 am to provider MCNEILL [facility] , who verbally acknowledged these results. Electronically Signed   By: Ulyses Jarred M.D.   On: 05/22/2022 00:07   VAS [place] LOWER EXTREMITY VENOUS (DVT)  Result Date: 05/21/2022  Lower Venous DVT Study Patient Name:  Alan Stewart  Date of Exam:   05/21/2022 Medical Rec #: [id number]       Accession #:    [id number] Date of Birth: 06/11/1946        Patient Gender: M Patient Age:   24 years Exam Location:  [facility] Procedure:      VAS [place] LOWER EXTREMITY VENOUS (DVT) Referring Phys: Cornelius Moras Tyneisha Hegeman --------------------------------------------------------------------------------  Indications: Embolic stroke, and Edema.  Comparison Study: No priors Performing Technologist: Velva Harman Sturdivant RDMS, RVT  Examination Guidelines: A complete evaluation includes B-mode imaging, spectral Doppler, color Doppler, and power Doppler as needed of all accessible portions of each vessel. Bilateral testing is considered an integral part of a complete examination. Limited examinations for reoccurring indications may be performed as noted. The reflux portion of the exam is performed with the patient in reverse Trendelenburg.  +---------+---------------+---------+-----------+----------+--------------+ RIGHT    CompressibilityPhasicitySpontaneityPropertiesThrombus Aging +---------+---------------+---------+-----------+----------+--------------+ CFV      Full           No       Yes                                 +---------+---------------+---------+-----------+----------+--------------+ SFJ      Full                                                        +---------+---------------+---------+-----------+----------+--------------+ FV Prox  Full                                                         +---------+---------------+---------+-----------+----------+--------------+ FV Mid   Full                                                        +---------+---------------+---------+-----------+----------+--------------+ FV DistalFull                                                        +---------+---------------+---------+-----------+----------+--------------+ PFV      Full                                                        +---------+---------------+---------+-----------+----------+--------------+  POP      Full           Yes      Yes                                 +---------+---------------+---------+-----------+----------+--------------+ PTV      Full                                                        +---------+---------------+---------+-----------+----------+--------------+ PERO     Full                                                        +---------+---------------+---------+-----------+----------+--------------+   +---------+---------------+---------+-----------+----------+--------------+ LEFT     CompressibilityPhasicitySpontaneityPropertiesThrombus Aging +---------+---------------+---------+-----------+----------+--------------+ CFV      Full           Yes      Yes                                 +---------+---------------+---------+-----------+----------+--------------+ SFJ      Full                                                        +---------+---------------+---------+-----------+----------+--------------+ FV Prox  Full                                                        +---------+---------------+---------+-----------+----------+--------------+ FV Mid   Full                                                        +---------+---------------+---------+-----------+----------+--------------+ FV DistalFull                                                         +---------+---------------+---------+-----------+----------+--------------+ PFV      Full                                                        +---------+---------------+---------+-----------+----------+--------------+ POP      Full           Yes      Yes                                 +---------+---------------+---------+-----------+----------+--------------+  PTV      Full                                                        +---------+---------------+---------+-----------+----------+--------------+ PERO     Full                                                        +---------+---------------+---------+-----------+----------+--------------+     Summary: BILATERAL: - No evidence of deep vein thrombosis seen in the lower extremities, bilaterally. -No evidence of popliteal cyst, bilaterally.   *See table(s) above for measurements and observations. Electronically signed by Deitra Mayo MD on 05/21/2022 at 5:01:56 PM.    Final    ECHOCARDIOGRAM COMPLETE  Result Date: 05/21/2022    ECHOCARDIOGRAM REPORT   Patient Name:   Alan Stewart Date of Exam: 05/21/2022 Medical Rec #:  [id number]      Height:       67.0 in Accession #:    [id number]     Weight:       276.9 lb Date of Birth:  [birth year]       BSA:          2.322 m Patient Age:    56 years       BP:           124/57 mmHg Patient Gender: M              HR:           64 bpm. Exam Location:  Inpatient Procedure: 2D Echo, Cardiac Doppler, Color Doppler and Intracardiac            Opacification Agent Indications:    stroke  History:        Patient has prior history of Echocardiogram examinations, most                 recent 10/21/2021. CAD; Risk Factors:Hypertension, Dyslipidemia                 and Sleep Apnea.  Sonographer:    Johny Chess RDCS Referring Phys: [place]  Sonographer Comments: Technically difficult study due to poor echo windows. Image acquisition challenging due to patient body habitus and  Image acquisition challenging due to respiratory motion. IMPRESSIONS  1. Left ventricular ejection fraction, by estimation, is 60 to 65%. The left ventricle has normal function. The left ventricle has no regional wall motion abnormalities. Left ventricular diastolic parameters are consistent with Grade I diastolic dysfunction (impaired relaxation).  2. Right ventricular systolic function is normal. The right ventricular size is normal. Tricuspid regurgitation signal is inadequate for assessing PA pressure.  3. The mitral valve is normal in structure. No evidence of mitral valve regurgitation. No evidence of mitral stenosis.  4. The aortic valve is tricuspid. Aortic valve regurgitation is not visualized. No aortic stenosis is present.  5. The inferior vena cava is normal in size with greater than 50% respiratory variability, suggesting right atrial pressure of 3 mmHg. FINDINGS  Left Ventricle: Left ventricular ejection fraction, by estimation, is 60 to 65%. The left ventricle has normal function. The left ventricle has no regional wall  motion abnormalities. Definity contrast agent was given IV to delineate the left ventricular  endocardial borders. The left ventricular internal cavity size was normal in size. There is no left ventricular hypertrophy. Left ventricular diastolic parameters are consistent with Grade I diastolic dysfunction (impaired relaxation). Normal left ventricular filling pressure. Right Ventricle: The right ventricular size is normal. No increase in right ventricular wall thickness. Right ventricular systolic function is normal. Tricuspid regurgitation signal is inadequate for assessing PA pressure. Left Atrium: Left atrial size was normal in size. Right Atrium: Right atrial size was normal in size. Pericardium: There is no evidence of pericardial effusion. Presence of epicardial fat layer. Mitral Valve: The mitral valve is normal in structure. No evidence of mitral valve regurgitation. No  evidence of mitral valve stenosis. Tricuspid Valve: The tricuspid valve is normal in structure. Tricuspid valve regurgitation is not demonstrated. No evidence of tricuspid stenosis. Aortic Valve: The aortic valve is tricuspid. Aortic valve regurgitation is not visualized. No aortic stenosis is present. Pulmonic Valve: The pulmonic valve was grossly normal. Pulmonic valve regurgitation is not visualized. No evidence of pulmonic stenosis. Aorta: The aortic root is normal in size and structure. Venous: The inferior vena cava is normal in size with greater than 50% respiratory variability, suggesting right atrial pressure of 3 mmHg. IAS/Shunts: No atrial level shunt detected by color flow Doppler.  LEFT VENTRICLE PLAX 2D LVIDd:         5.60 cm   Diastology LVIDs:         3.20 cm   LV e' medial:    7.29 cm/s LV PW:         1.10 cm   LV E/e' medial:  8.9 LV IVS:        1.00 cm   LV e' lateral:   7.72 cm/s LVOT diam:     2.20 cm   LV E/e' lateral: 8.4 LV SV:         60 LV SV Index:   26 LVOT Area:     3.80 cm  RIGHT VENTRICLE             IVC RV S prime:     16.40 cm/s  IVC diam: 1.50 cm TAPSE (M-mode): 2.1 cm LEFT ATRIUM           Index        RIGHT ATRIUM           Index LA diam:      3.40 cm 1.46 cm/m   RA Area:     13.00 cm LA Vol (A4C): 49.0 ml 21.10 ml/m  RA Volume:   30.30 ml  13.05 ml/m  AORTIC VALVE LVOT Vmax:   77.70 cm/s LVOT Vmean:  49.100 cm/s LVOT VTI:    0.157 m  AORTA Ao Root diam: 3.40 cm MITRAL VALVE MV Area (PHT): 3.37 cm    SHUNTS MV Decel Time: 225 msec    Systemic VTI:  0.16 m MV E velocity: 64.60 cm/s  Systemic Diam: 2.20 cm MV A velocity: 74.70 cm/s MV E/A ratio:  0.86 Mihai Croitoru MD Electronically signed by Sanda Klein MD Signature Date/Time: 05/21/2022/10:55:08 AM    Final    CT ANGIO HEAD NECK W WO CM (CODE STROKE)  Result Date: 05/21/2022 CLINICAL DATA:  Acute neurologic deficit EXAM: CT ANGIOGRAPHY HEAD AND NECK TECHNIQUE: Multidetector CT imaging of the head and neck was  performed using the standard protocol during bolus administration of intravenous contrast. Multiplanar CT image reconstructions and MIPs  were obtained to evaluate the vascular anatomy. Carotid stenosis measurements (when applicable) are obtained utilizing NASCET criteria, using the distal internal carotid diameter as the denominator. RADIATION DOSE REDUCTION: This exam was performed according to the departmental dose-optimization program which includes automated exposure control, adjustment of the mA and/or kV according to patient size and/or use of iterative reconstruction technique. CONTRAST:  70m OMNIPAQUE IOHEXOL 350 MG/ML SOLN COMPARISON:  None Available. FINDINGS: CTA NECK FINDINGS SKELETON: There is no bony spinal canal stenosis. No lytic or blastic lesion. OTHER NECK: Normal pharynx, larynx and major salivary glands. No cervical lymphadenopathy. Unremarkable thyroid gland. UPPER CHEST: No pneumothorax or pleural effusion. No nodules or masses. AORTIC ARCH: There is calcific atherosclerosis of the aortic arch. There is no aneurysm, dissection or hemodynamically significant stenosis of the visualized portion of the aorta. Conventional 3 vessel aortic branching pattern. The visualized proximal subclavian arteries are widely patent. RIGHT CAROTID SYSTEM: No dissection, occlusion or aneurysm. There is mixed density atherosclerosis extending into the proximal ICA, resulting in less than 50% stenosis. LEFT CAROTID SYSTEM: No dissection, occlusion or aneurysm. There is mixed density atherosclerosis extending into the proximal ICA, resulting in less than 50% stenosis. VERTEBRAL ARTERIES: Codominant configuration. Both origins are clearly patent. There is no dissection, occlusion or flow-limiting stenosis to the skull base (V1-V3 segments). CTA HEAD FINDINGS POSTERIOR CIRCULATION: --Vertebral arteries: Normal V4 segments. --Inferior cerebellar arteries: Normal. --Basilar artery: Normal. --Superior cerebellar  arteries: Normal. --Posterior cerebral arteries (PCA): Normal. ANTERIOR CIRCULATION: --Intracranial internal carotid arteries: With atherosclerotic calcification within both internal carotid arteries at the skull base. The terminus of the left ICA is occluded. --Anterior cerebral arteries (ACA): Normal. Both A1 segments are present. Patent anterior communicating artery (a-comm). --Middle cerebral arteries (MCA): There is occlusion of the left MCA with minimal distal collateralization. The right MCA is normal. VENOUS SINUSES: As permitted by contrast timing, patent. ANATOMIC VARIANTS: None Review of the MIP images confirms the above findings. IMPRESSION: 1. 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 2. Bilateral carotid bifurcation atherosclerosis without hemodynamically significant stenosis. Critical Value/emergent results were called by telephone at the time of interpretation on 05/21/2022 at 12:00 am to [facility] who verbally acknowledged these results. Aortic atherosclerosis (ICD10-I70.0). Electronically Signed   By: KUlyses JarredM.D.   On: 05/21/2022 00:02   CT HEAD CODE STROKE WO CONTRAST  Result Date: 05/20/2022 CLINICAL DATA:  Code stroke.  Acute neurologic deficit EXAM: CT HEAD WITHOUT CONTRAST TECHNIQUE: Contiguous axial images were obtained from the base of the skull through the vertex without intravenous contrast. RADIATION DOSE REDUCTION: This exam was performed according to the departmental dose-optimization program which includes automated exposure control, adjustment of the mA and/or kV according to patient size and/or use of iterative reconstruction technique. COMPARISON:  None Available. FINDINGS: Brain: There is no mass, hemorrhage or extra-axial collection. The size and configuration of the ventricles and extra-axial CSF spaces are normal. The brain parenchyma is normal, without evidence of acute or chronic  infarction. Cortical calcification left paracentral. Vascular: Atherosclerotic calcification of the internal carotid arteries at the skull base. No abnormal hyperdensity of the major intracranial arteries or dural venous sinuses. Skull: The visualized skull base, calvarium and extracranial soft tissues are normal. Sinuses/Orbits: No fluid levels or advanced mucosal thickening of the visualized paranasal sinuses. No mastoid or middle ear effusion. The orbits are normal. ASPECTS ([facility]) - Ganglionic level infarction (caudate, lentiform  nuclei, internal capsule, insula, M1-M3 cortex): 7 - Supraganglionic infarction (M4-M6 cortex): 3 Total score (0-10 with 10 being normal): 10 IMPRESSION: 1. No acute intracranial abnormality. 2. ASPECTS is 10 These results were communicated to Dr. Roland Rack at 11:33 pm on 05/20/2022 by text page via the [facility] messaging system. Electronically Signed   By: Ulyses Jarred M.D.   On: 05/20/2022 23:34      HISTORY OF PRESENT ILLNESS Alan Stewart is a 76 y.o. male with a history of htn, hpl, CAD who presents with right-sided weakness and aphasia.  He was in his normal state of health at 10 PM when his wife went to bed, but then when he came to bed shortly thereafter, he seemed off.  She asked him what was wrong and he did not answer.  When he would not speak, she called 911.   He arrived as a code stroke and was taken for an emergent CT/CTA.  This demonstrated an left M1 occlusion.  He is within the time window for IV tenecteplase.  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  I discussed risks, benefits, and alternatives of IV tenecteplase with the patient's wife and strongly encouraged her to proceed given the severity of his symptoms and she agreed.  When CTA revealed the large vessel occlusion, we discussed thrombectomy with the patient's wife who again  agreed to proceed.   LKW: 10 pm tpa given?:  Yes IR Thrombectomy?  Yes Modified Rankin Scale: 0-Completely asymptomatic and back to baseline post- stroke NIHSS: [place] Alan Stewart is a 76 y.o. male with history of hypertension, hyperlipidemia, CAD status post stent, OSA, obesity admitted for right-sided weakness and aphasia.  TNK given.  Status post IR for left terminal ICA and A1 and M1 occlusion. Loop recorder to be placed prior to discharge.    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 Per Dr. Estanislado Pandy, clot felt to be cardioembolic source instead of ICAD MRI cortical ischemia throughout left MCA territory compatible with recent infarction. Small focus of acute hemorrhage along the anterior surface of the left temporal lobe Heidelberg classification 3c: Subarachnoid hemorrhage. MRA normal with restored flow in the left MCA 2D Echo EF 60 to 65% LE venous Doppler no DVT Loop recorder prior to discharge to rule out A-fib LDL 56 HgbA1c 6.0 UDS negative SCDs VTE prophylaxis aspirin 81 mg daily and Brilinta (ticagrelor) 90 mg bid prior to admission, on ASA and brilinta home meds.  Ongoing aggressive stroke risk factor management Therapy recommendations: outpatient PT, OT, ST Disposition: Pending   Hypertension Home meds: Coreg Stable Resume home coreg BP goal SBP < 180 / 105 Long term BP goal normotensive   Hyperlipidemia Home meds: Lipitor 80 and Zetia LDL 56, goal < 70 Now on Lipitor 80 and Zetia Continue statin at discharge   Other Stroke Risk Factors Advanced age Obesity, Body mass index is 43.37 kg/m.  Coronary artery disease status post stent Obstructive sleep  apnea   DISCHARGE EXAM Blood pressure (!) 157/89, pulse (!) 58, temperature 98.4 F (36.9 C), temperature source Oral, resp. rate  18, height '5\' 7"'$  (1.702 m), weight 125.6 kg, SpO2 96 %. General - Well nourished, well developed, in no apparent distress. Respiratory- SpO2 98% on room air, non-labored breathing, no audible wheezing Cardiovascular - Regular rhythm and rate.   Neuro - awake, alert, eyes open, expressive aphasia, "yes" and "no" appropriately. Answers orientation questions appropriately with choices. Follows commands.  Not able to name or repeat. No gaze palsy, tracking bilaterally, visual fields full.  Mild left facial droop. Tongue midline. RUE 4/5 and slight drift. BLE 4/5 with slightly decreased RLE elevation compared to the left.  Sensation intact, no ataxia noted on exam  Discharge Diet       Diet   DIET DYS 3 Room service appropriate? Yes; Fluid consistency: Thin   liquids  DISCHARGE PLAN Disposition:  Home with outpatient therapy aspirin 81 mg daily and Brilinta (ticagrelor) 90 mg bid home meds Ongoing stroke risk factor control by Primary Care Physician at time of discharge Follow-up PCP Tama High III, MD in 2 weeks. Follow-up in [facility] in 4 weeks, office to schedule an appointment.   35 minutes were spent preparing discharge.  Patient seen and examined by NP/APP with MD. MD to update note as needed.   Janine Ores, DNP, FNP-BC [facility] Pager: [phone]  ATTENDING NOTE: I reviewed above note and agree with the assessment and plan.   No acute event overnight.  Patient still has expressive aphasia but improving.  Moving all extremities, PT/OT recommend outpatient PT/OT.  EP on board, will place loop recorder prior to discharge.  Continue aspirin and Brilinta, Lipitor and Zetia.  Stroke risk factor modification.  Patient will be discharged in good condition.  Follow-up at [facility] in 4 weeks.  For detailed  assessment and plan, please refer to above/below as I have made changes wherever appropriate.   Alan Hawking, MD PhD Stroke Neurology 05/23/2022 3:31 PM

## 2022-05-23 NOTE — Progress Notes (Signed)
Occupational Therapy Treatment Patient Details Name: Alan Stewart MRN: [id number] DOB: 11-21-1945 Today's Date: 05/23/2022   History of present illness Patient is a 76 y/o male with PMH of HTN, CAD, HLD, arthritis, SCC, sleep apnea who presented wtih R side weakness and aphasia now s/p IV tenecteplase and IR thrombectomy of L carotid terminal occlusion involving ACA & MCA.   OT comments  This 76 yo male admitted with above presents to acute OT today with being S for OOB, S for mobility in room, S for sit<>stand, S to doff/donn socks and shirt. He can sing the"ABC's" with me better today (yesterday 1/4 letters, today 1/2 letters) as well as we tried counting to 10 (started with me at number 5). Was going to dry communication board with I'm but NP came in to talk to him about halter monitor so gave wife the dry erase board with marker and handout that has letters and words on it to see if either of these help him with communication. We will continue to follow.   Recommendations for follow up therapy are one component of a multi-disciplinary discharge planning process, led by the attending physician.  Recommendations may be updated based on patient status, additional functional criteria and insurance authorization.    Follow Up Recommendations  Outpatient OT    Assistance Recommended at Discharge Frequent or constant Supervision/Assistance  Patient can return home with the following  A little help with walking and/or transfers;A little help with bathing/dressing/bathroom;Help with stairs or ramp for entrance;Assistance with cooking/housework;Assist for transportation;Direct supervision/assist for financial management;Direct supervision/assist for medications management   Equipment Recommendations  None recommended by OT       Precautions / Restrictions Precautions Precautions: Fall Restrictions Weight Bearing Restrictions: No       Mobility Bed Mobility Overal bed mobility:  Independent Bed Mobility: Supine to Sit                Transfers Overall transfer level: Needs assistance Equipment used: None Transfers: Sit to/from Stand Sit to Stand: Supervision                 Balance Overall balance assessment: Mild deficits observed, not formally tested (in standing)                                         ADL either performed or assessed with clinical judgement   ADL Overall ADL's : Needs assistance/impaired                 Upper Body Dressing : Set up;Supervision/safety;Sitting   Lower Body Dressing: Supervision/safety;Sit to/from stand                      Extremity/Trunk Assessment Upper Extremity Assessment Upper Extremity Assessment: Overall WFL for tasks assessed            Vision Patient Visual Report: No change from baseline            Cognition Arousal/Alertness: Awake/alert Behavior During Therapy: WFL for tasks assessed/performed Overall Cognitive Status: Difficult to assess                                 General Comments: Continues to answer yes/no questions with 50-75% accuracy sometimes self correcting, still cannot answer open ended questions. He cannot tell me what objects  are but can show me what they are used for with increased time (ie:cup, flashlight, comb)                   Pertinent Vitals/ Pain       Pain Assessment Pain Assessment: No/denies pain         Frequency  Min 2X/week        Progress Toward Goals  OT Goals(current goals can now be found in the care plan section)  Progress towards OT goals: Progressing toward goals  Acute Rehab OT Goals OT Goal Formulation: With patient/family Time For Goal Achievement: 06/05/22 Potential to Achieve Goals: Good  Plan Discharge plan remains appropriate       AM-PAC OT "6 Clicks" Daily Activity     Outcome Measure   Help from another person eating meals?: None Help from another person  taking care of personal grooming?: A Little Help from another person toileting, which includes using toliet, bedpan, or urinal?: A Little Help from another person bathing (including washing, rinsing, drying)?: A Little Help from another person to put on and taking off regular upper body clothing?: A Little Help from another person to put on and taking off regular lower body clothing?: A Little 6 Click Score: 19    End of Session    OT Visit Diagnosis: Unsteadiness on feet (R26.81);Cognitive communication deficit (R41.841) Symptoms and signs involving cognitive functions: Cerebral infarction   Activity Tolerance Patient tolerated treatment well   Patient Left in chair;with call bell/phone within reach;with chair alarm set;with family/visitor present;with nursing/sitter in room   Nurse Communication  (pt can stand well)        Time: 7824-2353 OT Time Calculation (min): 31 min  Charges: OT General Charges $OT Visit: 1 Visit OT Treatments $Self Care/Home Management : 23-37 mins  [place], OTR/L Acute Rehab Services [facility] [phone] Office [phone]    Almon Register 05/23/2022, 11:30 AM

## 2022-05-23 NOTE — Plan of Care (Signed)
S/P Stroke care reviewed with patient and wife. Loop recorder placed and education completed at bedside by [employer], [place].

## 2022-05-23 NOTE — Progress Notes (Signed)
Patient discharged via wheelchair with front wheel walker in his possessionl. AVS reviewed with patient and his wife. No questions.

## 2022-05-23 NOTE — Plan of Care (Signed)
Problem: Education: Goal: Knowledge of General Education information will improve Description: Including pain rating scale, medication(s)/side effects and non-pharmacologic comfort measures Outcome: Progressing   Problem: Health Behavior/Discharge Planning: Goal: Ability to manage health-related needs will improve Outcome: Progressing   Problem: Clinical Measurements: Goal: Ability to maintain clinical measurements within normal limits will improve Outcome: Progressing Goal: Will remain free from infection Outcome: Progressing Goal: Diagnostic test results will improve Outcome: Progressing Goal: Respiratory complications will improve Outcome: Progressing Goal: Cardiovascular complication will be avoided Outcome: Progressing   Problem: Activity: Goal: Risk for activity intolerance will decrease Outcome: Progressing   Problem: Nutrition: Goal: Adequate nutrition will be maintained Outcome: Progressing   Problem: Coping: Goal: Level of anxiety will decrease Outcome: Progressing   Problem: Elimination: Goal: Will not experience complications related to bowel motility Outcome: Progressing Goal: Will not experience complications related to urinary retention Outcome: Progressing   Problem: Pain Managment: Goal: General experience of comfort will improve Outcome: Progressing   Problem: Safety: Goal: Ability to remain free from injury will improve Outcome: Progressing   Problem: Skin Integrity: Goal: Risk for impaired skin integrity will decrease Outcome: Progressing   Problem: Education: Goal: Understanding of CV disease, CV risk reduction, and recovery process will improve Outcome: Progressing Goal: Individualized Educational Video(s) Outcome: Progressing   Problem: Activity: Goal: Ability to return to baseline activity level will improve Outcome: Progressing   Problem: Cardiovascular: Goal: Ability to achieve and maintain adequate cardiovascular perfusion  will improve Outcome: Progressing Goal: Vascular access site(s) Level 0-1 will be maintained Outcome: Progressing   Problem: Health Behavior/Discharge Planning: Goal: Ability to safely manage health-related needs after discharge will improve Outcome: Progressing   Problem: Education: Goal: Ability to describe self-care measures that may prevent or decrease complications (Diabetes Survival Skills Education) will improve Outcome: Progressing Goal: Individualized Educational Video(s) Outcome: Progressing   Problem: Coping: Goal: Ability to adjust to condition or change in health will improve Outcome: Progressing   Problem: Fluid Volume: Goal: Ability to maintain a balanced intake and output will improve Outcome: Progressing   Problem: Health Behavior/Discharge Planning: Goal: Ability to identify and utilize available resources and services will improve Outcome: Progressing Goal: Ability to manage health-related needs will improve Outcome: Progressing   Problem: Metabolic: Goal: Ability to maintain appropriate glucose levels will improve Outcome: Progressing   Problem: Nutritional: Goal: Maintenance of adequate nutrition will improve Outcome: Progressing Goal: Progress toward achieving an optimal weight will improve Outcome: Progressing   Problem: Skin Integrity: Goal: Risk for impaired skin integrity will decrease Outcome: Progressing   Problem: Tissue Perfusion: Goal: Adequacy of tissue perfusion will improve Outcome: Progressing   Problem: Education: Goal: Knowledge of disease or condition will improve Outcome: Progressing Goal: Knowledge of secondary prevention will improve (SELECT ALL) Outcome: Progressing Goal: Knowledge of patient specific risk factors will improve (INDIVIDUALIZE FOR PATIENT) Outcome: Progressing Goal: Individualized Educational Video(s) Outcome: Progressing   Problem: Coping: Goal: Will verbalize positive feelings about self Outcome:  Progressing Goal: Will identify appropriate support needs Outcome: Progressing   Problem: Health Behavior/Discharge Planning: Goal: Ability to manage health-related needs will improve Outcome: Progressing   Problem: Self-Care: Goal: Ability to participate in self-care as condition permits will improve Outcome: Progressing Goal: Verbalization of feelings and concerns over difficulty with self-care will improve Outcome: Progressing Goal: Ability to communicate needs accurately will improve Outcome: Progressing   Problem: Nutrition: Goal: Risk of aspiration will decrease Outcome: Progressing Goal: Dietary intake will improve Outcome: Progressing   Problem: Intracerebral Hemorrhage Tissue Perfusion: Goal: Complications  of Intracerebral Hemorrhage will be minimized Outcome: Progressing   Problem: Ischemic Stroke/TIA Tissue Perfusion: Goal: Complications of ischemic stroke/TIA will be minimized Outcome: Progressing

## 2022-05-23 NOTE — Discharge Instructions (Addendum)
outpatient rehab with PT, OT, and ST Referral for neurology outpatient and cardiology outpatient for stroke management and loop recorder monitoring   Care After Your Loop Recorder  You have a [employer] Loop Recorder   Monitor your cardiac device site for redness, swelling, and drainage. Call the device clinic at [phone] if you experience these symptoms or fever/chills.  If you notice bleeding from your site, hold firm, but gently pressure with two fingers for 5 minutes. Dried blood on the steri-strips when removing the outer bandage is normal.   Keep the large square bandage on your site for 24 hours and then you may remove it yourself. Keep the steri-strips underneath in place.   You may shower after 72 hours / 3 days from your procedure with the steri-strips in place. They will usually fall off on their own, or may be removed after 10 days. Pat dry.   Avoid lotions, ointments, or perfumes over your incision until it is well-healed.  Please do not submerge in water until your site is completely healed.   Your device is MRI compatible.   Remote monitoring is used to monitor your cardiac device from home. This monitoring is scheduled every month by our office. It allows [place] to keep an eye on the function of your device to ensure it is working properly.

## 2022-05-23 NOTE — TOC Transition Note (Signed)
Transition of Care [facility]) - CM/SW Discharge Note   Patient Details  Name: Alan Stewart MRN: [id number] Date of Birth: 1946/03/28  [facility] CM/SW Contact:  Angelita Ingles, RN Phone Number:[phone]  05/23/2022, 11:36 AM   Clinical Narrative:    Patient discharging needs rolling walker. DME ordered per [employer] to be delivered to bedside prior to d/c. Ambulatory referrals for PT/OT/SLP orders have been entered. No other needs noted at this time.          Patient Goals and CMS Choice        Discharge Placement                       Discharge Plan and Services                                     Social Determinants of Health (SDOH) Interventions     Readmission Risk Interventions     No data to display

## 2022-05-23 NOTE — Progress Notes (Signed)
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    PT Comments    Limited because pt going for a procedure, but definite improvements in gait stability and general balance.  Not yet at baseline or age appropriate.    Recommendations for follow up therapy are one component of a multi-disciplinary discharge planning process, led by the attending physician.  Recommendations may be updated based on patient status, additional functional criteria and insurance authorization.  Follow Up Recommendations  Outpatient PT     Assistance Recommended at Discharge Intermittent Supervision/Assistance  Patient can return home with the following A little help with walking and/or transfers;A little help with bathing/dressing/bathroom;Direct supervision/assist for medications management;Help with stairs or ramp for entrance;Assistance with cooking/housework   Equipment Recommendations  None recommended by PT    Recommendations for Other Services       Precautions / Restrictions Precautions Precautions: Fall     Mobility  Bed Mobility               General bed mobility comments: sitting in chair on arrival    Transfers Overall transfer level: Needs assistance   Transfers: Sit to/from Stand Sit to Stand: Supervision                Ambulation/Gait Ambulation/Gait assistance: Min guard Gait Distance (Feet): 100 Feet Assistive device: IV Pole, None Gait Pattern/deviations: Step-through pattern   Gait velocity interpretation: <1.8 ft/sec, indicate of risk for recurrent falls   General Gait Details: improving step through pattern, but mildly paretic with  increased speed or abrupt turns.   Stairs             Wheelchair Mobility    Modified Rankin (Stroke Patients Only) Modified Rankin (Stroke Patients Only) Pre-Morbid Rankin Score: No symptoms Modified Rankin: Moderate disability     Balance Overall balance assessment: Mild deficits observed, not formally tested   Sitting balance-Leahy Scale: Good       Standing balance-Leahy Scale: Fair                              Cognition Arousal/Alertness: Awake/alert Behavior During Therapy: WFL for tasks assessed/performed Overall Cognitive Status: Difficult to assess                                          Exercises      General Comments        Pertinent Vitals/Pain Pain Assessment Faces Pain Scale: No hurt Pain Intervention(s): Monitored during session    Home Living                          Prior Function            PT Goals (current goals can now be found in the care plan section) Acute Rehab PT Goals Patient Stated Goal: to return to independent PT Goal Formulation: With patient/family Time For Goal Achievement: 06/04/22 Potential to Achieve Goals: Good Progress towards PT goals: Progressing toward goals    Frequency  Min 4X/week      PT Plan Current plan remains appropriate    Co-evaluation              AM-PAC PT "6 Clicks" Mobility   Outcome Measure  Help needed turning from your back to your side while in a flat bed without using bedrails?: A Little Help needed moving from lying on your back to sitting on the side of a flat bed without using bedrails?: A Little Help needed moving to and from a bed to a chair (including a wheelchair)?: A Little Help needed standing up from a chair using your arms (e.g., wheelchair or bedside chair)?: A Little Help needed to walk in hospital room?: A Little Help needed climbing 3-5 steps with a railing? : A Little 6 Click Score: 18    End of Session    Activity Tolerance: Patient tolerated treatment well   Nurse Communication: Mobility status PT Visit Diagnosis: Other abnormalities of gait and mobility (R26.89);Other symptoms and signs involving the nervous system (R29.898)     Time: 0034-9179 PT Time Calculation (min) (ACUTE ONLY): 22 min  Charges:  $Gait Training: 8-22 mins                     05/23/2022  Alan Stewart., PT Acute Rehabilitation Services [phone]  (office)   Alan Stewart 05/23/2022, 5:05 PM

## 2022-05-23 NOTE — Consult Note (Signed)
ELECTROPHYSIOLOGY CONSULT NOTE  Patient ID: KIWAN GADSDEN MRN: [id number], DOB/AGE: [birth year]   Admit date: 05/20/2022 Date of Consult: 05/23/2022  Primary Physician: Adin Hector, MD Primary Cardiologist: Kathlyn Sacramento, MD  Primary Electrophysiologist: New to None  Reason for Consultation: Cryptogenic stroke; recommendations regarding Implantable Loop Recorder Insurance: [employer]  History of Present Illness EP has been asked to evaluate Domenick Gong Duchesne for placement of an implantable loop recorder to monitor for atrial fibrillation by Dr Erlinda Hong.  The patient was admitted on 05/20/2022 with right-sided weakness and aphasia.    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    He has undergone workup for stroke:  CT no acute abnormality CTA head and neck left terminal ICA and MCA occlusion, bilateral carotid atherosclerosis S/p IR - left terminal ICA, A1 and M1 occlusion with TICI3. Per Dr. Estanislado Pandy, clot felt to be cardioembolic source instead of ICAD MRI cortical ischemia throughout left MCA territory compatible with recent infarction. Small focus of acute hemorrhage along the anterior surface of the left temporal lobe Heidelberg classification 3c: Subarachnoid hemorrhage. MRA normal with restored flow in the left MCA 2D Echo EF 60 to 65% LE venous Doppler no DVT LDL 56 HgbA1c 6.0 UDS negative SCDs VTE prophylaxis aspirin 81 mg daily and Brilinta (ticagrelor) 90 mg bid prior to admission, on ASA and brilinta home meds.  Ongoing aggressive stroke risk factor management Therapy recommendations: outpatient PT  Disposition: Pending   The patient has been monitored on telemetry which has demonstrated sinus rhythm with no arrhythmias.  Inpatient stroke work-up will not require a TEE per Neurology.   Echocardiogram as above. Lab work is reviewed.  Prior to  admission, the patient denies chest pain, shortness of breath, dizziness, palpitations, or syncope.  He is recovering from his stroke with plans to return home  at discharge.  Past Medical History:  Diagnosis Date   CAD (coronary artery disease)    a. 10/2021 NSTEMI/PCI: LM nl, LAD min irregs, LCX 84m(2.5x18 Onyx Frontier DES), OM1 nl, RCA 939m3.5x26 Onyx Frontier DES), 30d.   Diastolic dysfunction    a. 10/2021 Echo: EF 50-55%, no rwma, Gr1 DD, nl RV fxn. No significant valvular dzs.   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   left dorsal forearm, EDC   Sleep apnea      Surgical History:  Past Surgical History:  Procedure Laterality Date   COLONOSCOPY N/A 10/09/2015   Procedure: COLONOSCOPY;  Surgeon: PaHulen LusterMD;  [facility];  Service: Gastroenterology;  Laterality: N/A;   CORONARY STENT INTERVENTION N/A 10/22/2021   Procedure: CORONARY STENT INTERVENTION;  Surgeon: ArWellington HampshireMD;  [facility];  Service: Cardiovascular;  Laterality: N/A;   IR CT HEAD LTD  05/21/2022   IR PERCUTANEOUS ART THROMBECTOMY/INFUSION INTRACRANIAL INC DIAG ANGIO  05/21/2022   LEFT HEART CATH AND CORONARY ANGIOGRAPHY N/A 10/22/2021   Procedure: LEFT HEART CATH AND CORONARY ANGIOGRAPHY;  Surgeon: ArWellington HampshireMD;  [facility];  Service: Cardiovascular;  Laterality: N/A;   RADIOLOGY WITH ANESTHESIA N/A 05/20/2022   Procedure: IR WITH ANESTHESIA;  Surgeon: DeLuanne BrasMD;  [facility] Service: Radiology;  Laterality: N/A;   ROTATOR CUFF REPAIR  Medications Prior to Admission  Medication Sig Dispense Refill Last Dose   aspirin EC 81 MG EC tablet Take 1 tablet (81 mg total) by mouth daily. Swallow whole. 30 tablet 11 05/20/2022   atorvastatin (LIPITOR) 80 MG tablet Take 1 tablet (80 mg total) by mouth daily. 90 tablet 3 05/20/2022    carvedilol (COREG) 6.25 MG tablet Take 1 tablet (6.25 mg total) by mouth 2 (two) times daily with a meal. 180 tablet 1 05/20/2022 at 1700   ezetimibe (ZETIA) 10 MG tablet Take 1 tablet (10 mg total) by mouth daily. 90 tablet 3 05/20/2022   levothyroxine (SYNTHROID) 75 MCG tablet Take 75 mcg by mouth daily before breakfast.   05/20/2022   ticagrelor (BRILINTA) 90 MG TABS tablet Take 1 tablet (90 mg total) by mouth 2 (two) times daily. 180 tablet 1 05/20/2022 at 1700   nitroGLYCERIN (NITROSTAT) 0.4 MG SL tablet Place 1 tablet (0.4 mg total) under the tongue every 5 (five) minutes as needed for chest pain. (Patient not taking: Reported on 05/21/2022) 30 tablet 12 Not Taking    Inpatient Medications:   aspirin EC  81 mg Oral Daily   atorvastatin  80 mg Oral Daily   carvedilol  6.25 mg Oral BID WC   Chlorhexidine Gluconate Cloth  6 each Topical Q0600   enoxaparin (LOVENOX) injection  40 mg Subcutaneous Q24H   ezetimibe  10 mg Oral Daily   insulin aspart  0-15 Units Subcutaneous Q4H   levothyroxine  75 mcg Oral QAC breakfast   mouth rinse  15 mL Mouth Rinse 4 times per day   pantoprazole  40 mg Oral Daily   sodium chloride flush  3 mL Intravenous Once   ticagrelor  90 mg Oral BID    Allergies: No Known Allergies  Social History   Socioeconomic History   Marital status: Married    Spouse name: Not on file   Number of children: Not on file   Years of education: Not on file   Highest education level: Not on file  Occupational History   Not on file  Tobacco Use   Smoking status: Former    Packs/day: 1.50    Years: 10.00    Total pack years: 15.00    Types: Cigarettes    Quit date: 07/19/1990    Years since quitting: 31.8   Smokeless tobacco: Former  Scientific laboratory technician Use: Never used  Substance and Sexual Activity   Alcohol use: Yes   Drug use: No   Sexual activity: Not on file  Other Topics Concern   Not on file  Social History Narrative   Not on file   Social Determinants of  Health   Financial Resource Strain: Not on file  Food Insecurity: Not on file  Transportation Needs: Not on file  Physical Activity: Not on file  Stress: Not on file  Social Connections: Not on file  Intimate Partner Violence: Not on file     No family history on file.    Review of Systems: All other systems reviewed and are otherwise negative except as noted above.  Physical Exam: Vitals:   05/23/22 0500 05/23/22 0600 05/23/22 0700 05/23/22 0750  BP: (!) 157/67 (!) 145/68 (!) 157/89   Pulse: (!) 55 (!) 49 (!) 58   Resp: '17 15 18   '$ Temp:    98.4 F (36.9 C)  TempSrc:    Oral  SpO2: 94% 96% 96%   Weight:  Height:       GEN- The patient is well appearing, aphasic, answers all questions with "yes" Head- normocephalic, atraumatic Eyes-  Sclera clear, conjunctiva pink Ears- hearing intact Oropharynx- clear Neck- supple Lungs- Clear to ausculation bilaterally, normal work of breathing Heart- Regular rate and rhythm, no murmurs, rubs or gallops  GI- soft, NT, ND, + BS Extremities- no clubbing, cyanosis, or edema MS- no significant deformity or atrophy Skin- no rash or lesion Psych- euthymic mood, full affect Neuro- aphasic, unable to assess orientation, able to move limbs independently   Labs:   Lab Results  Component Value Date   WBC 7.2 05/21/2022   HGB 13.4 05/21/2022   HCT 39.8 05/21/2022   MCV 90.0 05/21/2022   PLT 207 05/21/2022    Recent Labs  Lab 05/20/22 2315 05/20/22 2319 05/21/22 0433  NA 142   < > 138  K 4.2   < > 4.0  CL 108   < > 110  CO2 22  --  22  BUN 17   < > 15  CREATININE 1.20   < > 1.08  CALCIUM 9.3  --  8.3*  PROT 6.1*  --   --   BILITOT 0.6  --   --   ALKPHOS 105  --   --   ALT 30  --   --   AST 29  --   --   GLUCOSE 117*   < > 195*   < > = values in this interval not displayed.     Radiology/Studies: IR PERCUTANEOUS ART THROMBECTOMY/INFUSION INTRACRANIAL INC DIAG ANGIO  Result Date: 05/22/2022 INDICATION: New onset  right-sided weakness, global aphasia and right-sided neglect. EXAM: 1. EMERGENT LARGE VESSEL OCCLUSION THROMBOLYSIS (anterior circulation) COMPARISON:  CT angiogram of the head and neck of 05/20/2022. MEDICATIONS: Ancef 2 g IV for antibiotic was administered within 1 hour of the procedure. ANESTHESIA/SEDATION: General anesthesia. CONTRAST:  Omnipaque 300 75 mL. FLUOROSCOPY TIME:  Fluoroscopy Time: 16 minutes 54 seconds (1247 mGy). COMPLICATIONS: None immediate. TECHNIQUE: Following a full explanation of the procedure along with the potential associated complications, an informed witnessed consent was obtained. The risks of intracranial hemorrhage of 10%, worsening neurological deficit, ventilator dependency, death and inability to revascularize were all reviewed in detail with the patient's spouse. The patient was then put under general anesthesia by the Department of Anesthesiology at [facility]. The right groin was prepped and draped in the usual sterile fashion. Thereafter using modified Seldinger technique, transfemoral access into the right common femoral artery was obtained without difficulty. Over a 0.035 inch guidewire an 8 French 25 cm Pinnacle sheath was inserted. Through this, and also over a 0.035 inch guidewire a combination of a Simmons 2 support catheter inside of a 95 cm 087 balloon guide catheter was advanced to the aortic arch as evidenced in the left internal carotid artery bulb without evidence ulcerations arch region, and selectively positioned in the left common carotid artery, and then advanced to the distal cervical left ICA. The guidewire was removed. Good aspiration obtained from the hub of the balloon guide catheter. Gentle control arteriogram through this demonstrated no evidence of vasospasm, dissections or intraluminal filling defects. FINDINGS: The left common carotid arteriogram demonstrates the left external carotid artery and its major branches to be widely patent. The left  internal carotid artery at the bulb to the cranial skull base demonstrates patency with gradual reduced caliber at the skull base. Nonocclusive atherosclerotic plaque noted in the left carotid bulb.  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 Patency of the ipsilateral ophthalmic artery noted. PROCEDURE: Through the balloon guide catheter in the cervical petrous junction, over an 014 inch Softip Aristotle micro guidewire with a moderate J configuration, an 021 160 cm microcatheter inside of a 132 cm Zoom aspiration catheter combination was advanced initially in the proximal supraclinoid segment, and then using a torque device access into the left middle cerebral artery inferior division M2 region was obtained with the micro guidewire followed by the microcatheter. The Zoom aspiration catheter was advanced to the region of the mid M1 segment. The micro guidewire was removed. Good aspiration was obtained from the hub of the microcatheter. A gentle control arteriogram performed through this demonstrates safe positioning of the tip of the microcatheter. This was then connected to continuous heparinized saline infusion. A 4 mm x 40 mm Solitaire X retrieval device was then advanced to the distal end of the microcatheter, and the retriever was deployed in the usual fashion. Contact aspiration had already been started through the Zoom aspiration catheter in the occluded M1 segment. With proximal flow arrest in the left internal carotid artery, and contact aspiration the Zoom aspiration catheter, for 2 minutes, the combination of the retrieval device, the microcatheter and the Zoom aspiration catheter were retrieved and removed. Reversal of flow arrest, a control arteriogram via balloon guide catheter demonstrated complete revascularization of the supraclinoid left ICA, the left anterior cerebral  artery and the left MCA distribution achieving a TICI 3 revascularization. No evidence of filling defects was noted. Moderate spasm in the left M1 segment responded to 25 mcg of nitroglycerin intra-arterially. The balloon guide catheter was retrieved and removed. An 8 French Angio-Seal closure device was then deployed for hemostasis at the right groin puncture site. Distal pulses remained Dopplerable in both feet unchanged. A flat panel CT of the brain demonstrated no evidence of hemorrhagic complications or mass effect. Patient's general anesthesia was reversed and patient was extubated. Upon recovery, the patient was able to move his left side spontaneously and able to bend his right knee. No spontaneous movement was evident in the right upper extremity. Patient continued to have comprehension difficulties. He was then transferred to the medical ICU for post revascularization care. IMPRESSION: Status post complete revascularization of the left internal carotid artery terminus occlusion, the left middle cerebral artery and the left anterior cerebral artery distribution with 1 pass with a 4 mm x 40 mm Solitaire X retrieval device and contact aspiration achieving a TICI 3 revascularization. Time from groin puncture to complete revascularization approximately 15 minutes. PLAN: Follow-up as per referring MD. Electronically Signed   By: Luanne Bras M.D.   On: 05/22/2022 10:33   IR CT Head Ltd  Result Date: 05/22/2022 INDICATION: New onset right-sided weakness, global aphasia and right-sided neglect. EXAM: 1. EMERGENT LARGE VESSEL OCCLUSION THROMBOLYSIS (anterior circulation) COMPARISON:  CT angiogram of the head and neck of 05/20/2022. MEDICATIONS: Ancef 2 g IV for antibiotic was administered within 1 hour of the procedure. ANESTHESIA/SEDATION: General anesthesia. CONTRAST:  Omnipaque 300 75 mL. FLUOROSCOPY TIME:  Fluoroscopy Time: 16 minutes 54 seconds (1247 mGy). COMPLICATIONS: None immediate. TECHNIQUE:  Following a full explanation of the procedure along with the potential associated complications, an informed witnessed consent was obtained. The risks of intracranial hemorrhage of 10%, worsening neurological deficit, ventilator dependency, death and inability to revascularize were all reviewed  in detail with the patient's spouse. The patient was then put under general anesthesia by the Department of Anesthesiology at [facility]. The right groin was prepped and draped in the usual sterile fashion. Thereafter using modified Seldinger technique, transfemoral access into the right common femoral artery was obtained without difficulty. Over a 0.035 inch guidewire an 8 French 25 cm Pinnacle sheath was inserted. Through this, and also over a 0.035 inch guidewire a combination of a Simmons 2 support catheter inside of a 95 cm 087 balloon guide catheter was advanced to the aortic arch as evidenced in the left internal carotid artery bulb without evidence ulcerations arch region, and selectively positioned in the left common carotid artery, and then advanced to the distal cervical left ICA. The guidewire was removed. Good aspiration obtained from the hub of the balloon guide catheter. Gentle control arteriogram through this demonstrated no evidence of vasospasm, dissections or intraluminal filling defects. FINDINGS: The left common carotid arteriogram demonstrates the left external carotid artery and its major branches to be widely patent. The left internal carotid artery at the bulb to the cranial skull base demonstrates patency with gradual reduced caliber at the skull base. Nonocclusive atherosclerotic plaque noted in the left carotid bulb. 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 Patency of the ipsilateral ophthalmic artery noted. PROCEDURE: Through the balloon guide catheter in the  cervical petrous junction, over an 014 inch Softip Aristotle micro guidewire with a moderate J configuration, an 021 160 cm microcatheter inside of a 132 cm Zoom aspiration catheter combination was advanced initially in the proximal supraclinoid segment, and then using a torque device access into the left middle cerebral artery inferior division M2 region was obtained with the micro guidewire followed by the microcatheter. The Zoom aspiration catheter was advanced to the region of the mid M1 segment. The micro guidewire was removed. Good aspiration was obtained from the hub of the microcatheter. A gentle control arteriogram performed through this demonstrates safe positioning of the tip of the microcatheter. This was then connected to continuous heparinized saline infusion. A 4 mm x 40 mm Solitaire X retrieval device was then advanced to the distal end of the microcatheter, and the retriever was deployed in the usual fashion. Contact aspiration had already been started through the Zoom aspiration catheter in the occluded M1 segment. With proximal flow arrest in the left internal carotid artery, and contact aspiration the Zoom aspiration catheter, for 2 minutes, the combination of the retrieval device, the microcatheter and the Zoom aspiration catheter were retrieved and removed. Reversal of flow arrest, a control arteriogram via balloon guide catheter demonstrated complete revascularization of the supraclinoid left ICA, the left anterior cerebral artery and the left MCA distribution achieving a TICI 3 revascularization. No evidence of filling defects was noted. Moderate spasm in the left M1 segment responded to 25 mcg of nitroglycerin intra-arterially. The balloon guide catheter was retrieved and removed. An 8 French Angio-Seal closure device was then deployed for hemostasis at the right groin puncture site. Distal pulses remained Dopplerable in both feet unchanged. A flat panel CT of the brain demonstrated no  evidence of hemorrhagic complications or mass effect. Patient's general anesthesia was reversed and patient was extubated. Upon recovery, the patient was able to move his left side spontaneously and able to bend his right knee. No spontaneous movement was evident in the right upper extremity. Patient  continued to have comprehension difficulties. He was then transferred to the medical ICU for post revascularization care. IMPRESSION: Status post complete revascularization of the left internal carotid artery terminus occlusion, the left middle cerebral artery and the left anterior cerebral artery distribution with 1 pass with a 4 mm x 40 mm Solitaire X retrieval device and contact aspiration achieving a TICI 3 revascularization. Time from groin puncture to complete revascularization approximately 15 minutes. PLAN: Follow-up as per referring MD. Electronically Signed   By: Luanne Bras M.D.   On: 05/22/2022 10:33   MR BRAIN WO CONTRAST  Result Date: 05/22/2022 CLINICAL DATA:  Left MCA infarct EXAM: MRI HEAD WITHOUT CONTRAST MRA HEAD WITHOUT CONTRAST TECHNIQUE: Multiplanar, multi-echo pulse sequences of the brain and surrounding structures were acquired without intravenous contrast. Angiographic images of the Circle of Willis were acquired using MRA technique without intravenous contrast. COMPARISON:  None Available. FINDINGS: MRI HEAD FINDINGS Brain: Cortical diffusion restriction throughout the left MCA territory. Small focus of acute hemorrhage along the anterior surface of the left temporal lobe. Punctate focus of diffusion restriction left occipital. Normal white matter signal, parenchymal volume and CSF spaces. The midline structures are normal. Vascular: Major flow voids are preserved. Skull and upper cervical spine: Normal calvarium and skull base. Visualized upper cervical spine and soft tissues are normal. Sinuses/Orbits:No paranasal sinus fluid levels or advanced mucosal thickening. No mastoid or  middle ear effusion. Normal orbits. MRA HEAD FINDINGS POSTERIOR CIRCULATION: --Vertebral arteries: Normal --Inferior cerebellar arteries: Normal. --Basilar artery: Normal. --Superior cerebellar arteries: Normal. --Posterior cerebral arteries: Normal. ANTERIOR CIRCULATION: --Intracranial internal carotid arteries: Normal. --Anterior cerebral arteries (ACA): Normal. --Middle cerebral arteries (MCA): Normal. ANATOMIC VARIANTS: None IMPRESSION: 1. Cortical ischemia throughout the left MCA territory compatible with recent infarct. Relatively little affect on the white matter. 2. Small focus of acute hemorrhage along the anterior surface of the left temporal lobe Heidelberg classification 3c: Subarachnoid hemorrhage. 3. Normal intracranial MRA.  Restored flow in left MCA. Electronically Signed   By: Ulyses Jarred M.D.   On: 05/22/2022 00:55   MR ANGIO HEAD WO CONTRAST  Result Date: 05/22/2022 CLINICAL DATA:  Left MCA infarct EXAM: MRI HEAD WITHOUT CONTRAST MRA HEAD WITHOUT CONTRAST TECHNIQUE: Multiplanar, multi-echo pulse sequences of the brain and surrounding structures were acquired without intravenous contrast. Angiographic images of the Circle of Willis were acquired using MRA technique without intravenous contrast. COMPARISON:  None Available. FINDINGS: MRI HEAD FINDINGS Brain: Cortical diffusion restriction throughout the left MCA territory. Small focus of acute hemorrhage along the anterior surface of the left temporal lobe. Punctate focus of diffusion restriction left occipital. Normal white matter signal, parenchymal volume and CSF spaces. The midline structures are normal. Vascular: Major flow voids are preserved. Skull and upper cervical spine: Normal calvarium and skull base. Visualized upper cervical spine and soft tissues are normal. Sinuses/Orbits:No paranasal sinus fluid levels or advanced mucosal thickening. No mastoid or middle ear effusion. Normal orbits. MRA HEAD FINDINGS POSTERIOR CIRCULATION:  --Vertebral arteries: Normal --Inferior cerebellar arteries: Normal. --Basilar artery: Normal. --Superior cerebellar arteries: Normal. --Posterior cerebral arteries: Normal. ANTERIOR CIRCULATION: --Intracranial internal carotid arteries: Normal. --Anterior cerebral arteries (ACA): Normal. --Middle cerebral arteries (MCA): Normal. ANATOMIC VARIANTS: None IMPRESSION: 1. Cortical ischemia throughout the left MCA territory compatible with recent infarct. Relatively little affect on the white matter. 2. Small focus of acute hemorrhage along the anterior surface of the left temporal lobe Heidelberg classification 3c: Subarachnoid hemorrhage. 3. Normal intracranial MRA.  Restored flow in left MCA. Electronically Signed  By: Ulyses Jarred M.D.   On: 05/22/2022 00:55   CT HEAD WO CONTRAST  Result Date: 05/22/2022 CLINICAL DATA:  24 hour follow-up after TNK administration. EXAM: CT HEAD WITHOUT CONTRAST TECHNIQUE: Contiguous axial images were obtained from the base of the skull through the vertex without intravenous contrast. RADIATION DOSE REDUCTION: This exam was performed according to the departmental dose-optimization program which includes automated exposure control, adjustment of the mA and/or kV according to patient size and/or use of iterative reconstruction technique. COMPARISON:  Head CT 05/20/2022 FINDINGS: Brain: There is now a small focus of subarachnoid hemorrhage over the superior surface of the left temporal lobe. No other site of acute hemorrhage. No midline shift or other mass effect. Slightly increased area of hypoattenuation at the left insula. Vascular: No hyperdense vessel or unexpected calcification. Skull: Normal. Negative for fracture or focal lesion. Sinuses/Orbits: No acute finding. Other: None. IMPRESSION: 1. Small focus of subarachnoid hemorrhage over the superior surface of the left temporal lobe. 2. Slightly increased area of early subacute infarct of the left insula. Critical  Value/emergent results were called by telephone at the time of interpretation on 05/22/2022 at 12:05 am to provider [facility] , who verbally acknowledged these results. Electronically Signed   By: Ulyses Jarred M.D.   On: 05/22/2022 00:07   VAS [place] LOWER EXTREMITY VENOUS (DVT)  Result Date: 05/21/2022  Lower Venous DVT Study Patient Name:  KAMDIN FOLLETT  Date of Exam:   05/21/2022 Medical Rec #: [id number]       Accession #:    [id number] Date of Birth: 08/21/1945        Patient Gender: M Patient Age:   70 years Exam Location:  [facility] Procedure:      VAS [place] LOWER EXTREMITY VENOUS (DVT) Referring Phys: Cornelius Moras XU --------------------------------------------------------------------------------  Indications: Embolic stroke, and Edema.  Comparison Study: No priors Performing Technologist: Velva Harman Sturdivant RDMS, RVT  Examination Guidelines: A complete evaluation includes B-mode imaging, spectral Doppler, color Doppler, and power Doppler as needed of all accessible portions of each vessel. Bilateral testing is considered an integral part of a complete examination. Limited examinations for reoccurring indications may be performed as noted. The reflux portion of the exam is performed with the patient in reverse Trendelenburg.  +---------+---------------+---------+-----------+----------+--------------+ RIGHT    CompressibilityPhasicitySpontaneityPropertiesThrombus Aging +---------+---------------+---------+-----------+----------+--------------+ CFV      Full           No       Yes                                 +---------+---------------+---------+-----------+----------+--------------+ SFJ      Full                                                        +---------+---------------+---------+-----------+----------+--------------+ FV Prox  Full                                                        +---------+---------------+---------+-----------+----------+--------------+ FV  Mid   Full                                                        +---------+---------------+---------+-----------+----------+--------------+  FV DistalFull                                                        +---------+---------------+---------+-----------+----------+--------------+ PFV      Full                                                        +---------+---------------+---------+-----------+----------+--------------+ POP      Full           Yes      Yes                                 +---------+---------------+---------+-----------+----------+--------------+ PTV      Full                                                        +---------+---------------+---------+-----------+----------+--------------+ PERO     Full                                                        +---------+---------------+---------+-----------+----------+--------------+   +---------+---------------+---------+-----------+----------+--------------+ LEFT     CompressibilityPhasicitySpontaneityPropertiesThrombus Aging +---------+---------------+---------+-----------+----------+--------------+ CFV      Full           Yes      Yes                                 +---------+---------------+---------+-----------+----------+--------------+ SFJ      Full                                                        +---------+---------------+---------+-----------+----------+--------------+ FV Prox  Full                                                        +---------+---------------+---------+-----------+----------+--------------+ FV Mid   Full                                                        +---------+---------------+---------+-----------+----------+--------------+ FV DistalFull                                                        +---------+---------------+---------+-----------+----------+--------------+   PFV      Full                                                         +---------+---------------+---------+-----------+----------+--------------+ POP      Full           Yes      Yes                                 +---------+---------------+---------+-----------+----------+--------------+ PTV      Full                                                        +---------+---------------+---------+-----------+----------+--------------+ PERO     Full                                                        +---------+---------------+---------+-----------+----------+--------------+     Summary: BILATERAL: - No evidence of deep vein thrombosis seen in the lower extremities, bilaterally. -No evidence of popliteal cyst, bilaterally.   *See table(s) above for measurements and observations. Electronically signed by Deitra Mayo MD on 05/21/2022 at 5:01:56 PM.    Final    ECHOCARDIOGRAM COMPLETE  Result Date: 05/21/2022    ECHOCARDIOGRAM REPORT   Patient Name:   GODSON POLLAN Date of Exam: 05/21/2022 Medical Rec #:  [id number]      Height:       67.0 in Accession #:    [id number]     Weight:       276.9 lb Date of Birth:  1946-08-14       BSA:          2.322 m Patient Age:    77 years       BP:           124/57 mmHg Patient Gender: M              HR:           64 bpm. Exam Location:  Inpatient Procedure: 2D Echo, Cardiac Doppler, Color Doppler and Intracardiac            Opacification Agent Indications:    stroke  History:        Patient has prior history of Echocardiogram examinations, most                 recent 10/21/2021. CAD; Risk Factors:Hypertension, Dyslipidemia                 and Sleep Apnea.  Sonographer:    Johny Chess RDCS Referring Phys: [place]  Sonographer Comments: Technically difficult study due to poor echo windows. Image acquisition challenging due to patient body habitus and Image acquisition challenging due to respiratory motion. IMPRESSIONS  1. Left ventricular ejection fraction, by estimation, is 60  to 65%. The left ventricle has normal function. The left ventricle has no regional wall motion abnormalities. Left ventricular diastolic  parameters are consistent with Grade I diastolic dysfunction (impaired relaxation).  2. Right ventricular systolic function is normal. The right ventricular size is normal. Tricuspid regurgitation signal is inadequate for assessing PA pressure.  3. The mitral valve is normal in structure. No evidence of mitral valve regurgitation. No evidence of mitral stenosis.  4. The aortic valve is tricuspid. Aortic valve regurgitation is not visualized. No aortic stenosis is present.  5. The inferior vena cava is normal in size with greater than 50% respiratory variability, suggesting right atrial pressure of 3 mmHg. FINDINGS  Left Ventricle: Left ventricular ejection fraction, by estimation, is 60 to 65%. The left ventricle has normal function. The left ventricle has no regional wall motion abnormalities. Definity contrast agent was given IV to delineate the left ventricular  endocardial borders. The left ventricular internal cavity size was normal in size. There is no left ventricular hypertrophy. Left ventricular diastolic parameters are consistent with Grade I diastolic dysfunction (impaired relaxation). Normal left ventricular filling pressure. Right Ventricle: The right ventricular size is normal. No increase in right ventricular wall thickness. Right ventricular systolic function is normal. Tricuspid regurgitation signal is inadequate for assessing PA pressure. Left Atrium: Left atrial size was normal in size. Right Atrium: Right atrial size was normal in size. Pericardium: There is no evidence of pericardial effusion. Presence of epicardial fat layer. Mitral Valve: The mitral valve is normal in structure. No evidence of mitral valve regurgitation. No evidence of mitral valve stenosis. Tricuspid Valve: The tricuspid valve is normal in structure. Tricuspid valve regurgitation is not  demonstrated. No evidence of tricuspid stenosis. Aortic Valve: The aortic valve is tricuspid. Aortic valve regurgitation is not visualized. No aortic stenosis is present. Pulmonic Valve: The pulmonic valve was grossly normal. Pulmonic valve regurgitation is not visualized. No evidence of pulmonic stenosis. Aorta: The aortic root is normal in size and structure. Venous: The inferior vena cava is normal in size with greater than 50% respiratory variability, suggesting right atrial pressure of 3 mmHg. IAS/Shunts: No atrial level shunt detected by color flow Doppler.  LEFT VENTRICLE PLAX 2D LVIDd:         5.60 cm   Diastology LVIDs:         3.20 cm   LV e' medial:    7.29 cm/s LV PW:         1.10 cm   LV E/e' medial:  8.9 LV IVS:        1.00 cm   LV e' lateral:   7.72 cm/s LVOT diam:     2.20 cm   LV E/e' lateral: 8.4 LV SV:         60 LV SV Index:   26 LVOT Area:     3.80 cm  RIGHT VENTRICLE             IVC RV S prime:     16.40 cm/s  IVC diam: 1.50 cm TAPSE (M-mode): 2.1 cm LEFT ATRIUM           Index        RIGHT ATRIUM           Index LA diam:      3.40 cm 1.46 cm/m   RA Area:     13.00 cm LA Vol (A4C): 49.0 ml 21.10 ml/m  RA Volume:   30.30 ml  13.05 ml/m  AORTIC VALVE LVOT Vmax:   77.70 cm/s LVOT Vmean:  49.100 cm/s LVOT VTI:    0.157 m  AORTA Ao  Root diam: 3.40 cm MITRAL VALVE MV Area (PHT): 3.37 cm    SHUNTS MV Decel Time: 225 msec    Systemic VTI:  0.16 m MV E velocity: 64.60 cm/s  Systemic Diam: 2.20 cm MV A velocity: 74.70 cm/s MV E/A ratio:  0.86 Mihai Croitoru MD Electronically signed by Sanda Klein MD Signature Date/Time: 05/21/2022/10:55:08 AM    Final    CT ANGIO HEAD NECK W WO CM (CODE STROKE)  Result Date: 05/21/2022 CLINICAL DATA:  Acute neurologic deficit EXAM: CT ANGIOGRAPHY HEAD AND NECK TECHNIQUE: Multidetector CT imaging of the head and neck was performed using the standard protocol during bolus administration of intravenous contrast. Multiplanar CT image reconstructions and MIPs were  obtained to evaluate the vascular anatomy. Carotid stenosis measurements (when applicable) are obtained utilizing NASCET criteria, using the distal internal carotid diameter as the denominator. RADIATION DOSE REDUCTION: This exam was performed according to the departmental dose-optimization program which includes automated exposure control, adjustment of the mA and/or kV according to patient size and/or use of iterative reconstruction technique. CONTRAST:  74m OMNIPAQUE IOHEXOL 350 MG/ML SOLN COMPARISON:  None Available. FINDINGS: CTA NECK FINDINGS SKELETON: There is no bony spinal canal stenosis. No lytic or blastic lesion. OTHER NECK: Normal pharynx, larynx and major salivary glands. No cervical lymphadenopathy. Unremarkable thyroid gland. UPPER CHEST: No pneumothorax or pleural effusion. No nodules or masses. AORTIC ARCH: There is calcific atherosclerosis of the aortic arch. There is no aneurysm, dissection or hemodynamically significant stenosis of the visualized portion of the aorta. Conventional 3 vessel aortic branching pattern. The visualized proximal subclavian arteries are widely patent. RIGHT CAROTID SYSTEM: No dissection, occlusion or aneurysm. There is mixed density atherosclerosis extending into the proximal ICA, resulting in less than 50% stenosis. LEFT CAROTID SYSTEM: No dissection, occlusion or aneurysm. There is mixed density atherosclerosis extending into the proximal ICA, resulting in less than 50% stenosis. VERTEBRAL ARTERIES: Codominant configuration. Both origins are clearly patent. There is no dissection, occlusion or flow-limiting stenosis to the skull base (V1-V3 segments). CTA HEAD FINDINGS POSTERIOR CIRCULATION: --Vertebral arteries: Normal V4 segments. --Inferior cerebellar arteries: Normal. --Basilar artery: Normal. --Superior cerebellar arteries: Normal. --Posterior cerebral arteries (PCA): Normal. ANTERIOR CIRCULATION: --Intracranial internal carotid arteries: With atherosclerotic  calcification within both internal carotid arteries at the skull base. The terminus of the left ICA is occluded. --Anterior cerebral arteries (ACA): Normal. Both A1 segments are present. Patent anterior communicating artery (a-comm). --Middle cerebral arteries (MCA): There is occlusion of the left MCA with minimal distal collateralization. The right MCA is normal. VENOUS SINUSES: As permitted by contrast timing, patent. ANATOMIC VARIANTS: None Review of the MIP images confirms the above findings. IMPRESSION: 1. 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 2. Bilateral carotid bifurcation atherosclerosis without hemodynamically significant stenosis. Critical Value/emergent results were called by telephone at the time of interpretation on 05/21/2022 at 12:00 am to [facility], who verbally acknowledged these results. Aortic atherosclerosis (ICD10-I70.0). Electronically Signed   By: KUlyses JarredM.D.   On: 05/21/2022 00:02   CT HEAD CODE STROKE WO CONTRAST  Result Date: 05/20/2022 CLINICAL DATA:  Code stroke.  Acute neurologic deficit EXAM: CT HEAD WITHOUT CONTRAST TECHNIQUE: Contiguous axial images were obtained from the base of the skull through the vertex without intravenous contrast. RADIATION DOSE REDUCTION: This exam was performed according to the departmental dose-optimization program which includes automated exposure control, adjustment of the mA and/or kV according to patient size and/or  use of iterative reconstruction technique. COMPARISON:  None Available. FINDINGS: Brain: There is no mass, hemorrhage or extra-axial collection. The size and configuration of the ventricles and extra-axial CSF spaces are normal. The brain parenchyma is normal, without evidence of acute or chronic infarction. Cortical calcification left paracentral. Vascular: Atherosclerotic calcification of the internal carotid arteries at the skull base. No  abnormal hyperdensity of the major intracranial arteries or dural venous sinuses. Skull: The visualized skull base, calvarium and extracranial soft tissues are normal. Sinuses/Orbits: No fluid levels or advanced mucosal thickening of the visualized paranasal sinuses. No mastoid or middle ear effusion. The orbits are normal. ASPECTS [facility] Stroke Program Early CT Score) - Ganglionic level infarction (caudate, lentiform nuclei, internal capsule, insula, M1-M3 cortex): 7 - Supraganglionic infarction (M4-M6 cortex): 3 Total score (0-10 with 10 being normal): 10 IMPRESSION: 1. No acute intracranial abnormality. 2. ASPECTS is 10 These results were communicated to Dr. Roland Rack at 11:33 pm on 05/20/2022 by text page via the [facility] messaging system. Electronically Signed   By: Ulyses Jarred M.D.   On: 05/20/2022 23:34    12-lead ECG  (personally reviewed) All prior EKG's in EPIC reviewed with no documented atrial fibrillation  Telemetry SR/SB (personally reviewed)  Assessment and Plan:  1. Cryptogenic stroke The patient presents with cryptogenic stroke.  The patient does not have a TEE planned for this AM.  I spoke at length with the patient and spouse at bedside about monitoring for afib with an implantable loop recorder.  Risks, benefits, and alteratives to implantable loop recorder were discussed with the patient today.   At this time, the patient's spouse is very clear in their decision to proceed with implantable loop recorder. Patient is unable to provide consent.   Wound care was reviewed with the patient (keep incision clean and dry for 3 days).  Wound check scheduled and entered in AVS. Please call with questions.    Mamie Levers, NP 05/23/2022 9:53 AM

## 2022-05-24 ENCOUNTER — Telehealth: Payer: Self-pay

## 2022-05-24 ENCOUNTER — Encounter (HOSPITAL_COMMUNITY): Payer: Self-pay | Admitting: Cardiology

## 2022-05-24 NOTE — Telephone Encounter (Signed)
-----   Message from Mamie Levers, NP sent at 05/23/2022  2:10 PM EDT ----- Loop today with CL.

## 2022-05-24 NOTE — Telephone Encounter (Signed)
  Loop Recorder Follow up   Is patient connected to [employer]? Yes   Have steri-strips fallen off or been removed? No   Does the patient need in office follow up? No   Please continue to monitor your cardiac device site for redness, swelling, and drainage. Call the device clinic at [phone] if you experience these symptoms, fever/chills, or have questions about your device.   Remote monitoring is used to monitor your cardiac device from home. This monitoring is scheduled every month by our office. It allows us to keep an eye on the functioning of your device to ensure it is working properly.  

## 2022-05-27 DIAGNOSIS — Z8673 Personal history of transient ischemic attack (TIA), and cerebral infarction without residual deficits: Secondary | ICD-10-CM | POA: Insufficient documentation

## 2022-05-28 DIAGNOSIS — I10 Essential (primary) hypertension: Secondary | ICD-10-CM | POA: Diagnosis not present

## 2022-05-28 DIAGNOSIS — Z6841 Body Mass Index (BMI) 40.0 and over, adult: Secondary | ICD-10-CM | POA: Diagnosis not present

## 2022-05-28 DIAGNOSIS — I69351 Hemiplegia and hemiparesis following cerebral infarction affecting right dominant side: Secondary | ICD-10-CM | POA: Diagnosis not present

## 2022-05-28 DIAGNOSIS — E785 Hyperlipidemia, unspecified: Secondary | ICD-10-CM | POA: Diagnosis not present

## 2022-05-28 DIAGNOSIS — Z8673 Personal history of transient ischemic attack (TIA), and cerebral infarction without residual deficits: Secondary | ICD-10-CM | POA: Diagnosis not present

## 2022-05-28 DIAGNOSIS — I2510 Atherosclerotic heart disease of native coronary artery without angina pectoris: Secondary | ICD-10-CM | POA: Diagnosis not present

## 2022-05-28 DIAGNOSIS — E039 Hypothyroidism, unspecified: Secondary | ICD-10-CM | POA: Diagnosis not present

## 2022-05-28 DIAGNOSIS — G4730 Sleep apnea, unspecified: Secondary | ICD-10-CM | POA: Diagnosis not present

## 2022-05-28 DIAGNOSIS — E119 Type 2 diabetes mellitus without complications: Secondary | ICD-10-CM | POA: Diagnosis not present

## 2022-05-30 NOTE — Therapy (Signed)
OUTPATIENT PHYSICAL THERAPY NEURO EVALUATION   Patient Name: Alan Stewart MRN: [id number] DOB:Sep 22, 1945, 76 y.o., male Today's Date: 06/03/2022   PCP: Adin Hector REFERRING PROVIDER: Tama High III   PT End of Session - 06/03/22 0837     Visit Number 1    Number of Visits 1    Date for PT Re-Evaluation 06/03/22    Authorization Type --    PT Start Time 1015    PT Stop Time 1114    PT Time Calculation (min) 59 min    Equipment Utilized During Treatment Gait belt    Activity Tolerance Patient tolerated treatment well    Behavior During Therapy WFL for tasks assessed/performed             Past Medical History:  Diagnosis Date   CAD (coronary artery disease)    a. 10/2021 NSTEMI/PCI: LM nl, LAD min irregs, LCX 41m(2.5x18 Onyx Frontier DES), OM1 nl, RCA 965m3.5x26 Onyx Frontier DES), 30d.   Diastolic dysfunction    a. 10/2021 Echo: EF 50-55%, no rwma, Gr1 DD, nl RV fxn. No significant valvular dzs.   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   left dorsal forearm, EDC   Sleep apnea    Past Surgical History:  Procedure Laterality Date   COLONOSCOPY N/A 10/09/2015   Procedure: COLONOSCOPY;  Surgeon: PaHulen LusterMD;  [facility];  Service: Gastroenterology;  Laterality: N/A;   CORONARY STENT INTERVENTION N/A 10/22/2021   Procedure: CORONARY STENT INTERVENTION;  Surgeon: ArWellington HampshireMD;  [facility];  Service: Cardiovascular;  Laterality: N/A;   IR CT HEAD LTD  05/21/2022   IR PERCUTANEOUS ART THROMBECTOMY/INFUSION INTRACRANIAL INC DIAG ANGIO  05/21/2022   LEFT HEART CATH AND CORONARY ANGIOGRAPHY N/A 10/22/2021   Procedure: LEFT HEART CATH AND CORONARY ANGIOGRAPHY;  Surgeon: ArWellington HampshireMD;  [facility];  Service: Cardiovascular;  Laterality: N/A;   LOOP RECORDER INSERTION N/A 05/23/2022   Procedure: LOOP  RECORDER INSERTION;  Surgeon: LaVickie EpleyMD;  [facility];  Service: Cardiovascular;  Laterality: N/A;   RADIOLOGY WITH ANESTHESIA N/A 05/20/2022   Procedure: IR WITH ANESTHESIA;  Surgeon: DeLuanne BrasMD;  [facility] Service: Radiology;  Laterality: N/A;   ROTATOR CUFF REPAIR     Patient Active Problem List   Diagnosis Date Noted   St[place] (cerebrum) [place]   Middle cerebral artery embolism, 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   Pt accompanied by: significant other  PERTINENT HISTORY: Patient was hospitalized 05/20/22-05/23/22 for acute L MCA CVA with confusion with R facial droop and R sided weakness. Patient had IV tenecteplase and IR thromectomy of L carotid terminal occlusion involving ACA and MCA. Pmh includes HTN, HLD, prediabetes, sleep apnea, morbid obesity, arthritis, hypothyroidism, CAD. Patient had no home health after hospitalization. Almost back to pre-stroke mobility.   PAIN:  Are you having pain? No  PRECAUTIONS: None  WEIGHT BEARING RESTRICTIONS No  FALLS: Has patient fallen in last 6 months? No  LIVING ENVIRONMENT: Lives with: lives with their spouse Lives in: House/apartment Stairs: No Has following equipment at home: Environmental consultant - 2 wheeled and  shower chair  PLOF: Independent  PATIENT GOALS n/a  OBJECTIVE:   DIAGNOSTIC FINDINGS: CVA  COGNITION: Overall cognitive status: Impaired   SENSATION: WFL  COORDINATION: WFL   LOWER EXTREMITY ROM:     WFL  LOWER EXTREMITY MMT:    MMT Right Eval Left Eval  Hip flexion 5 5  Hip extension 4+ 4+  Hip abduction 5 5  Hip adduction 4+ 4+  Hip internal rotation 4+ 4+  Hip external rotation 4+ 4+  Knee flexion 5 5  Knee extension 5 5  Ankle dorsiflexion 5 5  Ankle plantarflexion 5 5  (Blank rows = not tested)   TRANSFERS: Assistive device utilized: None  Sit to stand: Complete Independence Stand to sit: Complete Independence Chair to chair: Complete Independence  GAIT: Gait pattern: step through pattern and narrow BOS Distance walked: 1000 Assistive device utilized: None Level of assistance: Complete Independence   FUNCTIONAL TESTs:  5 times sit to stand: 13 seconds arms crossed  10 meter walk test: 8 seconds  Berg Balance Scale: 52  BP prior to 6 min walk test: 174/71 : ambulate 1000 ft; BP after 152/66  TODAY'S TREATMENT:  Review HEP: no pickup from PT    PATIENT EDUCATION: Education details: HEP, joining silver sneakers Person educated: Patient and Spouse Education method: Explanation, Demonstration, Corporate treasurer cues, Verbal cues, and Handouts Education comprehension: verbalized understanding and returned demonstration   HOME EXERCISE PROGRAM: Access Code: [email or web address] URL: [email or web address] Date: 06/03/2022 Prepared by: Janna Arch  Exercises - Standing March with Counter Support  - 1 x daily - 7 x weekly - 2 sets - 10 reps - 5 hold - Standing Hip Extension with Counter Support  - 1 x daily - 7 x weekly - 2 sets - 10 reps - 5 hold - Standing Hip Abduction with Counter Support  - 1 x daily - 7 x weekly - 2 sets - 10 reps - 5 hold - Heel rises with counter support  - 1 x daily - 7 x weekly - 2 sets - 10 reps - 5  hold    GOALS: Goals reviewed with patient? Yes  SHORT TERM GOALS: Target date:  n/a  Patient will be independent in home exercise program to improve strength/mobility for better functional independence with ADLs. Baseline:HEP given  Goal status: MET ASSESSMENT:  CLINICAL IMPRESSION: Patient is a 76 y.o. male who was seen today for physical therapy evaluation and treatment for CVA. Per patient and patient's wife patient is back at baseline mobility. Patient's balance, ambulation, and mobility assessed and patient is not a risk for falling, is a Hydrographic surveyor, and has safe community speed. Education on patient benefiting from silver sneakers performed with wife agreeable. Will  not pick up patient onto PT caseload due to patient being at baseline.    OBJECTIVE IMPAIRMENTS decreased endurance.   ACTIVITY LIMITATIONS  n/a  PARTICIPATION LIMITATIONS:  n/a  PERSONAL FACTORS 3+ comorbidities: HTN, HLD, prediabetes, sleep apnea, morbid obesity, arthritis, hypothyroidism, CAD  are also affecting patient's functional outcome.   REHAB POTENTIAL: Excellent  CLINICAL DECISION MAKING: Evolving/moderate complexity  EVALUATION COMPLEXITY: Moderate  PLAN: PT FREQUENCY:  one time visit  PT DURATION: other: none; eval only    Janna Arch, PT 06/03/2022, 12:31 PM

## 2022-06-03 ENCOUNTER — Ambulatory Visit: Payer: Medicare HMO | Admitting: Speech Pathology

## 2022-06-03 ENCOUNTER — Ambulatory Visit: Payer: Medicare HMO | Attending: Internal Medicine

## 2022-06-03 DIAGNOSIS — R482 Apraxia: Secondary | ICD-10-CM | POA: Insufficient documentation

## 2022-06-03 DIAGNOSIS — R262 Difficulty in walking, not elsewhere classified: Secondary | ICD-10-CM | POA: Diagnosis not present

## 2022-06-03 DIAGNOSIS — R278 Other lack of coordination: Secondary | ICD-10-CM | POA: Diagnosis not present

## 2022-06-03 DIAGNOSIS — R4701 Aphasia: Secondary | ICD-10-CM | POA: Diagnosis not present

## 2022-06-03 DIAGNOSIS — I6602 Occlusion and stenosis of left middle cerebral artery: Secondary | ICD-10-CM | POA: Diagnosis not present

## 2022-06-03 DIAGNOSIS — R41841 Cognitive communication deficit: Secondary | ICD-10-CM

## 2022-06-03 DIAGNOSIS — M6281 Muscle weakness (generalized): Secondary | ICD-10-CM | POA: Diagnosis not present

## 2022-06-03 DIAGNOSIS — I639 Cerebral infarction, unspecified: Secondary | ICD-10-CM | POA: Insufficient documentation

## 2022-06-04 ENCOUNTER — Ambulatory Visit: Payer: Medicare HMO | Admitting: Dermatology

## 2022-06-04 ENCOUNTER — Ambulatory Visit: Payer: Medicare HMO

## 2022-06-04 ENCOUNTER — Ambulatory Visit: Payer: Medicare HMO | Admitting: Occupational Therapy

## 2022-06-04 ENCOUNTER — Encounter: Payer: Self-pay | Admitting: Dermatology

## 2022-06-04 DIAGNOSIS — Z1283 Encounter for screening for malignant neoplasm of skin: Secondary | ICD-10-CM

## 2022-06-04 DIAGNOSIS — L814 Other melanin hyperpigmentation: Secondary | ICD-10-CM | POA: Diagnosis not present

## 2022-06-04 DIAGNOSIS — D049 Carcinoma in situ of skin, unspecified: Secondary | ICD-10-CM

## 2022-06-04 DIAGNOSIS — D229 Melanocytic nevi, unspecified: Secondary | ICD-10-CM | POA: Diagnosis not present

## 2022-06-04 DIAGNOSIS — M6281 Muscle weakness (generalized): Secondary | ICD-10-CM

## 2022-06-04 DIAGNOSIS — D0461 Carcinoma in situ of skin of right upper limb, including shoulder: Secondary | ICD-10-CM

## 2022-06-04 DIAGNOSIS — R41841 Cognitive communication deficit: Secondary | ICD-10-CM | POA: Diagnosis not present

## 2022-06-04 DIAGNOSIS — L821 Other seborrheic keratosis: Secondary | ICD-10-CM | POA: Diagnosis not present

## 2022-06-04 DIAGNOSIS — I639 Cerebral infarction, unspecified: Secondary | ICD-10-CM | POA: Diagnosis not present

## 2022-06-04 DIAGNOSIS — R262 Difficulty in walking, not elsewhere classified: Secondary | ICD-10-CM | POA: Diagnosis not present

## 2022-06-04 DIAGNOSIS — R4701 Aphasia: Secondary | ICD-10-CM | POA: Diagnosis not present

## 2022-06-04 DIAGNOSIS — L570 Actinic keratosis: Secondary | ICD-10-CM | POA: Diagnosis not present

## 2022-06-04 DIAGNOSIS — Z85828 Personal history of other malignant neoplasm of skin: Secondary | ICD-10-CM | POA: Diagnosis not present

## 2022-06-04 DIAGNOSIS — I6602 Occlusion and stenosis of left middle cerebral artery: Secondary | ICD-10-CM | POA: Diagnosis not present

## 2022-06-04 DIAGNOSIS — R278 Other lack of coordination: Secondary | ICD-10-CM | POA: Diagnosis not present

## 2022-06-04 DIAGNOSIS — R482 Apraxia: Secondary | ICD-10-CM | POA: Diagnosis not present

## 2022-06-04 DIAGNOSIS — D492 Neoplasm of unspecified behavior of bone, soft tissue, and skin: Secondary | ICD-10-CM

## 2022-06-04 HISTORY — DX: Carcinoma in situ of skin, unspecified: D04.9

## 2022-06-04 NOTE — Patient Instructions (Addendum)
Cryotherapy Aftercare  Wash gently with soap and water everyday.   Apply Vaseline daily until healed.    Recommend using Hibiclens or Chlorhexidine surgical scrub to cleanse biopsy site once daily.   Wound Care Instructions  Cleanse wound gently with soap and water once a day then pat dry with clean gauze. Apply a thin coat of Petrolatum (petroleum jelly, "Vaseline") over the wound (unless you have an allergy to this). We recommend that you use a new, sterile tube of Vaseline. Do not pick or remove scabs. Do not remove the yellow or white "healing tissue" from the base of the wound.  Cover the wound with fresh, clean, nonstick gauze and secure with paper tape. You may use Band-Aids in place of gauze and tape if the wound is small enough, but would recommend trimming much of the tape off as there is often too much. Sometimes Band-Aids can irritate the skin.  You should call the office for your biopsy report after 1 week if you have not already been contacted.  If you experience any problems, such as abnormal amounts of bleeding, swelling, significant bruising, significant pain, or evidence of infection, please call the office immediately.  FOR ADULT SURGERY PATIENTS: If you need something for pain relief you may take 1 extra strength Tylenol (acetaminophen) AND 2 Ibuprofen ('200mg'$  each) together every 4 hours as needed for pain. (do not take these if you are allergic to them or if you have a reason you should not take them.) Typically, you may only need pain medication for 1 to 3 days.      Recommend daily broad spectrum sunscreen SPF 30+ to sun-exposed areas, reapply every 2 hours as needed. Call for new or changing lesions.  Staying in the shade or wearing long sleeves, sun glasses (UVA+UVB protection) and wide brim hats (4-inch brim around the entire circumference of the hat) are also recommended for sun protection.    Melanoma ABCDEs  Melanoma is the most dangerous type of skin cancer,  and is the leading cause of death from skin disease.  You are more likely to develop melanoma if you: Have light-colored skin, light-colored eyes, or red or blond hair Spend a lot of time in the sun Tan regularly, either outdoors or in a tanning bed Have had blistering sunburns, especially during childhood Have a close family member who has had a melanoma Have atypical moles or large birthmarks  Early detection of melanoma is key since treatment is typically straightforward and cure rates are extremely high if we catch it early.   The first sign of melanoma is often a change in a mole or a new dark spot.  The ABCDE system is a way of remembering the signs of melanoma.  A for asymmetry:  The two halves do not match. B for border:  The edges of the growth are irregular. C for color:  A mixture of colors are present instead of an even brown color. D for diameter:  Melanomas are usually (but not always) greater than 29m - the size of a pencil eraser. E for evolution:  The spot keeps changing in size, shape, and color.  Please check your skin once per month between visits. You can use a small mirror in front and a large mirror behind you to keep an eye on the back side or your body.   If you see any new or changing lesions before your next follow-up, please call to schedule a visit.  Please continue daily skin  protection including broad spectrum sunscreen SPF 30+ to sun-exposed areas, reapplying every 2 hours as needed when you're outdoors.   Staying in the shade or wearing long sleeves, sun glasses (UVA+UVB protection) and wide brim hats (4-inch brim around the entire circumference of the hat) are also recommended for sun protection.    Due to recent changes in healthcare laws, you may see results of your pathology and/or laboratory studies on MyChart before the doctors have had a chance to review them. We understand that in some cases there may be results that are confusing or concerning to  you. Please understand that not all results are received at the same time and often the doctors may need to interpret multiple results in order to provide you with the best plan of care or course of treatment. Therefore, we ask that you please give [place] 2 business days to thoroughly review all your results before contacting the office for clarification. Should we see a critical lab result, you will be contacted sooner.   If You Need Anything After Your Visit  If you have any questions or concerns for your doctor, please call our main line at [phone] and press option 4 to reach your doctor's medical assistant. If no one answers, please leave a voicemail as directed and we will return your call as soon as possible. Messages left after 4 pm will be answered the following business day.   You may also send [place] a message via [place]. We typically respond to MyChart messages within 1-2 business days.  For prescription refills, please ask your pharmacy to contact our office. Our fax number is [phone].  If you have an urgent issue when the clinic is closed that cannot wait until the next business day, you can page your doctor at the number below.    Please note that while we do our best to be available for urgent issues outside of office hours, we are not available 24/7.   If you have an urgent issue and are unable to reach [place], you may choose to seek medical care at your doctor's office, retail clinic, urgent care center, or emergency room.  If you have a medical emergency, please immediately call 911 or go to the emergency department.  Pager Numbers  - Dr. Nehemiah Massed: [phone]  - Dr. Laurence Ferrari: [phone]  - Dr. Nicole Kindred: [phone]  In the event of inclement weather, please call our main line at [phone] for an update on the status of any delays or closures.  Dermatology Medication Tips: Please keep the boxes that topical medications come in in order to help keep track of the  instructions about where and how to use these. Pharmacies typically print the medication instructions only on the boxes and not directly on the medication tubes.   If your medication is too expensive, please contact our office at [phone] option 4 or send [place] a message through [place].   We are unable to tell what your co-pay for medications will be in advance as this is different depending on your insurance coverage. However, we may be able to find a substitute medication at lower cost or fill out paperwork to get insurance to cover a needed medication.   If a prior authorization is required to get your medication covered by your insurance company, please allow [place] 1-2 business days to complete this process.  Drug prices often vary depending on where the prescription is filled and some pharmacies may offer cheaper prices.  The website www.goodrx.com contains  coupons for medications through different pharmacies. The prices here do not account for what the cost may be with help from insurance (it may be cheaper with your insurance), but the website can give you the price if you did not use any insurance.  - You can print the associated coupon and take it with your prescription to the pharmacy.  - You may also stop by our office during regular business hours and pick up a GoodRx coupon card.  - If you need your prescription sent electronically to a different pharmacy, notify our office through [facility] or by phone at [phone] option 4.     Si Usted Necesita Algo Despus de Su Visita  Tambin puede enviarnos un mensaje a travs de Pharmacist, community. Por lo general respondemos a los mensajes de MyChart en el transcurso de 1 a 2 das hbiles.  Para renovar recetas, por favor pida a su farmacia que se ponga en contacto con nuestra oficina. Harland Dingwall de fax es [place] [phone].  Si tiene un asunto urgente cuando la clnica est cerrada y que no puede esperar hasta el siguiente da hbil,  puede llamar/localizar a su doctor(a) al nmero que aparece a continuacin.   Por favor, tenga en cuenta que aunque hacemos todo lo posible para estar disponibles para asuntos urgentes fuera del horario de [place], no estamos disponibles las 24 horas del da, los 7 das de la [place].   Si tiene un problema urgente y no puede comunicarse con nosotros, puede optar por buscar atencin mdica  en el consultorio de su doctor(a), en una clnica privada, en un centro de atencin urgente o en una sala de emergencias.  Si tiene Engineering geologist, por favor llame inmediatamente al 911 o vaya a la sala de emergencias.  Nmeros de bper  - Dr. Nehemiah Massed: [phone]  - Dra. Moye: [phone]  - Dra. Nicole Kindred: [phone]  En caso de inclemencias del [place], por favor llame a Johnsie Kindred principal al [phone] para una actualizacin sobre el [place] de cualquier retraso o cierre.  Consejos para la medicacin en dermatologa: Por favor, guarde las cajas en las que vienen los medicamentos de uso tpico para ayudarle a seguir las instrucciones sobre dnde y cmo usarlos. Las farmacias generalmente imprimen las instrucciones del medicamento slo en las cajas y no directamente en los tubos del [place].   Si su medicamento es muy caro, por favor, pngase en contacto con Zigmund Daniel llamando al [phone] y presione la opcin 4 o envenos un mensaje a travs de Pharmacist, community.   No podemos decirle cul ser su copago por los medicamentos por adelantado ya que esto es diferente dependiendo de la cobertura de su seguro. Sin embargo, es posible que podamos encontrar un medicamento sustituto a Electrical engineer un formulario para que el seguro cubra el medicamento que se considera necesario.   Si se requiere una autorizacin previa para que su compaa de seguros [place] su medicamento, por favor permtanos de 1 a 2 das hbiles para completar este proceso.  Los precios de los medicamentos varan con  frecuencia dependiendo del Environmental consultant de dnde se surte la receta y alguna farmacias pueden ofrecer precios ms baratos.  El sitio web www.goodrx.com tiene cupones para medicamentos de Airline pilot. Los precios aqu no tienen en cuenta lo que podra costar con la ayuda del seguro (puede ser ms barato con su seguro), pero el sitio web puede darle el precio si no utiliz Research scientist (physical sciences).  - Puede imprimir el cupn  correspondiente y llevarlo con su receta a la farmacia.  - Tambin puede pasar por nuestra oficina durante el horario de atencin regular y Charity fundraiser una tarjeta de cupones de GoodRx.  - Si necesita que su receta se enve electrnicamente a una farmacia diferente, informe a nuestra oficina a travs de MyChart de [place] o por telfono llamando al [phone] y presione la opcin 4.

## 2022-06-04 NOTE — Therapy (Signed)
OUTPATIENT OCCUPATIONAL THERAPY NEURO EVALUATION  Patient Name: Alan Stewart MRN: [id number] DOB:Feb 20, 1946, 76 y.o., male Today's Date: 06/04/2022  PCP: Ramonita Lab, MD REFERRING PROVIDER: Ramonita Lab, MD   OT End of Session - 06/04/22 2159     Visit Number 1    Number of Visits 1    Date for OT Re-Evaluation 06/04/22    OT Start Time 2585    OT Stop Time 1615    OT Time Calculation (min) 60 min    Activity Tolerance Patient tolerated treatment well    Behavior During Therapy [facility] for tasks assessed/performed               Past Medical History:  Diagnosis Date   CAD (coronary artery disease)    a. 10/2021 NSTEMI/PCI: LM nl, LAD min irregs, LCX 44m(2.5x18 Onyx Frontier DES), OM1 nl, RCA 955m3.5x26 Onyx Frontier DES), 30d.   Diastolic dysfunction    a. 10/2021 Echo: EF 50-55%, no rwma, Gr1 DD, nl RV fxn. No significant valvular dzs.   Hyperglycemia    Hyperlipidemia    Hypertension    Morbid obesity ([place]   Osteoarthritis    SCC (squamous cell carcinoma) 05/16/2022   left dorsal forearm, EDC   Sleep apnea    Past Surgical History:  Procedure Laterality Date   COLONOSCOPY N/A 10/09/2015   Procedure: COLONOSCOPY;  Surgeon: PaHulen LusterMD;  [facility];  Service: Gastroenterology;  Laterality: N/A;   CORONARY STENT INTERVENTION N/A 10/22/2021   Procedure: CORONARY STENT INTERVENTION;  Surgeon: ArWellington HampshireMD;  [facility];  Service: Cardiovascular;  Laterality: N/A;   IR CT HEAD LTD  05/21/2022   IR PERCUTANEOUS ART THROMBECTOMY/INFUSION INTRACRANIAL INC DIAG ANGIO  05/21/2022   LEFT HEART CATH AND CORONARY ANGIOGRAPHY N/A 10/22/2021   Procedure: LEFT HEART CATH AND CORONARY ANGIOGRAPHY;  Surgeon: ArWellington HampshireMD;  [facility];  Service: Cardiovascular;  Laterality: N/A;   LOOP RECORDER INSERTION N/A 05/23/2022   Procedure: LOOP RECORDER INSERTION;  Surgeon: LaVickie EpleyMD;  [facility];   Service: Cardiovascular;  Laterality: N/A;   RADIOLOGY WITH ANESTHESIA N/A 05/20/2022   Procedure: IR WITH ANESTHESIA;  Surgeon: DeLuanne BrasMD;  [facility] Service: Radiology;  Laterality: N/A;   ROTATOR CUFF REPAIR     Patient Active Problem List   Diagnosis Date Noted   Stroke (cerebrum) ([place]/10/2021   Middle cerebral artery embolism, 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 Was accompanied by his wife for the initial evaluation  Pt accompanied by: significant other  PERTINENT HISTORY: Pt. Is a [age].o. male [facility] with a CVA with right sided weakness, and expressive aphasia. 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 Pt. Underwent a Thrombectomy. PMHx includes: HTN,  Hyperlipidemia, Obesity, CAD s/p stent, OSA, and loop recorder placement. Pt. Has had recent skin biopsies including one proximal to the right 2nd  digit.PIP.   PRECAUTIONS: None  WEIGHT BEARING RESTRICTIONS No  PAIN:  Are you having pain? No  FALLS: Has patient fallen in last 6 months? 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  Meal Prep: Making coffee, uses microwave Community mobility: Relies on family for transportation. Medication management: Pt. Wife is managing medication Financial management: WIfe always perfroms the task Handwriting: 100% legible Printed name. Pt. Is able to maintain a mature grasp on the pen with the right hand. Severity of aphasia is affecting written communication.  MOBILITY STATUS: Independent  POSTURE COMMENTS:  No Significant postural limitations Sitting balance: WNL  ACTIVITY TOLERANCE: Activity tolerance: WFL  FUNCTIONAL OUTCOME MEASURES: FOTO: 91  UPPER EXTREMITY ROM     Active ROM Right Eval WNL Left Eval WNL     UPPER EXTREMITY MMT:     MMT Right eval Left eval  Shoulder flexion 5/5 5/5  Shoulder abduction 5/5 5/5  Shoulder adduction    Shoulder extension    Shoulder internal rotation    Shoulder external rotation    Middle trapezius    Lower trapezius    Elbow flexion 5/5 5/5  Elbow extension    Wrist flexion    Wrist extension [facility] WFL  Wrist ulnar deviation    Wrist radial deviation    Wrist pronation    Wrist supination    (Blank rows = not tested)  HAND FUNCTION: Grip strength: Right: 38 lbs; Left: 46 lbs   Pinch strength: Lateral: R: 17#, L: 20#, 3pt.pinch: R: 14#, L:16#  Deep Skin biopsy located proximal to the right lateral PIP affecting right grip, and lateral pinch assessment  COORDINATION: 9 Hole Peg test: Right: 29. sec; Left: 33 sec  SENSATION:  Sensation/proprioception difficult the assess 2/2 expressive aphasia   EDEMA: WNL  MUSCLE TONE: WNL  COGNITION: Overall cognitive status: Difficulty to assess due to: Communication  impairment  VISION: Subjective report:  No changes from the CVA Baseline vision: Wears glasses for reading only Visual history:  Wife reports pressure in one eye.   VISION ASSESSMENT: Not tested   PRAXIS: WFL  TODAY'S TREATMENT:    Pt. Participated in the initial evaluation   PATIENT EDUCATION: Education details: OT services Person educated: Patient and Spouse Education method: Explanation, Demonstration, Tactile cues, and Verbal cues Education comprehension: returned demonstration, verbal cues required, and tactile cues required   HOME EXERCISE PROGRAM:  N/A   GOALS: Eval only. Goals are not warranted at this time   ASSESSMENT:  CLINICAL IMPRESSION:  Pt. Is a 76 y.o. male who was referred for OT services secondary to a CVA. Pt. Is back at baseline with daily ADL tasks. Pt. Has resumed assisting with IADL tasks at home. Pt.'s BUE strength, and [place] skills are WNL. Pt.'s right grip strength, and lateral pinch strength are WFL. Vision is at baseline. FOTO score is 91. Pt.'s writing legibility for printed text is near baseline per pt. and his wife, however severity of  aphasia affects his ability to perform written communication. Aphasia affecting IADL tasks to be addressed with ST. No further OT services are warranted at this time.  PERFORMANCE DEFICITS in functional skills including: IADLs affected by Aphasia cognitive skills including  Aphasia , and psychosocial skills including coping strategies, environmental adaptation, habits, interpersonal interactions, and routines and behaviors.   IMPAIRMENTS are limiting patient from IADLs.   COMORBIDITIES may have co-morbidities  that affects occupational performance. Patient will benefit from skilled OT to address above impairments and improve overall function.  MODIFICATION OR ASSISTANCE TO COMPLETE EVALUATION: Min-Moderate modification of tasks or assist with assess necessary to complete an evaluation.  OT OCCUPATIONAL PROFILE  AND HISTORY: Detailed assessment: Review of records and additional review of physical, cognitive, psychosocial history related to current functional performance.  CLINICAL DECISION MAKING: low  REHAB POTENTIAL: Good  EVALUATION COMPLEXITY: Moderate    PLAN: OT FREQUENCY: one time visit  OT DURATION: other: Eval only  PLANNED INTERVENTIONS: Eval only. No treatment interventions warranted at this time  RECOMMENDED OTHER SERVICES: ST  CONSULTED AND AGREED WITH PLAN OF CARE: Patient and family member/caregiver  PLAN FOR NEXT SESSION: N/A  Harrel Carina, MS, OTR/L   Harrel Carina, OT 06/04/2022, 3:28 PM

## 2022-06-04 NOTE — Therapy (Signed)
OUTPATIENT SPEECH LANGUAGE PATHOLOGY APHASIA EVALUATION   Patient Name: Alan Stewart MRN: [id number] DOB:[birth year], 76 y.o., male Today's Date: 06/04/2022  PCP: Ramonita Lab, MD REFERRING PROVIDER: Ramonita Lab, MD   End of Session - 06/04/22 [phone]     Visit Number 1    Number of Visits 63 (tx-3xweek)   Date for SLP Re-Evaluation 08/26/22    Authorization Type [employer] H[employer]    Progress Note Due on Visit 10    SLP Start Time 0900    SLP Stop Time  1000    SLP Time Calculation (min) 60 min    Activity Tolerance Patient tolerated treatment well             Past Medical History:  Diagnosis Date   CAD (coronary artery disease)    a. 10/2021 NSTEMI/PCI: LM nl, LAD min irregs, LCX 53m(2.5x18 Onyx Frontier DES), OM1 nl, RCA 928m3.5x26 Onyx Frontier DES), 30d.   Diastolic dysfunction    a. 10/2021 Echo: EF 50-55%, no rwma, Gr1 DD, nl RV fxn. No significant valvular dzs.   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   left dorsal forearm, EDC   Sleep apnea    Past Surgical History:  Procedure Laterality Date   COLONOSCOPY N/A 10/09/2015   Procedure: COLONOSCOPY;  Surgeon: PaHulen LusterMD;  [facility];  Service: Gastroenterology;  Laterality: N/A;   CORONARY STENT INTERVENTION N/A 10/22/2021   Procedure: CORONARY STENT INTERVENTION;  Surgeon: ArWellington HampshireMD;  [facility];  Service: Cardiovascular;  Laterality: N/A;   IR CT HEAD LTD  05/21/2022   IR PERCUTANEOUS ART THROMBECTOMY/INFUSION INTRACRANIAL INC DIAG ANGIO  05/21/2022   LEFT HEART CATH AND CORONARY ANGIOGRAPHY N/A 10/22/2021   Procedure: LEFT HEART CATH AND CORONARY ANGIOGRAPHY;  Surgeon: ArWellington HampshireMD;  [facility];  Service: Cardiovascular;  Laterality: N/A;   LOOP RECORDER INSERTION N/A 05/23/2022   Procedure: LOOP RECORDER INSERTION;  Surgeon: LaVickie Epley MD;  [facility];  Service: Cardiovascular;  Laterality: N/A;   RADIOLOGY WITH ANESTHESIA N/A 05/20/2022   Procedure: IR WITH ANESTHESIA;  Surgeon: DeLuanne BrasMD;  [facility]: [facility] Service: Radiology;  Laterality: N/A;   ROTATOR CUFF REPAIR     Patient Active Problem List   Diagnosis Date Noted   Stroke (cerebrum) ([facility]   Middle cerebral artery embolism, 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 DIAG:  Aphasia  Cognitive communication deficit  Apraxia  Middle cerebral artery embolism, left  Rationale for Evaluation and Treatment Rehabilitation  SUBJECTIVE:   SUBJECTIVE STATEMENT: Pt pleasant, eager, expressive deficits present, wife provides history Pt accompanied by: self and significant other  PERTINENT HISTORY:  Patient is a [age].o. male with PMH: HTN, CAD, HLD, osteoarthritis, SCC, sleep apnea. He presented to the hospital on 05/20/22 with right sided weakness and aphasia with LKW reported by wife to be 10pm on 10/2.  [facility] and  taken for emergent CT/CTA which demonstrated left M1 occlusion. He received IV tenecteplase and IR thrombectomy due to large vessel occlusion.     DIAGNOSTIC FINDINGS:  MRI 05/22/2022 Cortical ischemia throughout the left MCA territory compatible with recent infarct. Relatively little affect on the white matter. 2. Small focus of acute hemorrhage along the anterior surface of the left temporal lobe Heidelberg classification 3c: Subarachnoid hemorrhage. 3. Normal intracranial MRA.  Restored flow in left MCA.  PAIN:  Are you having  pain? No  FALLS: Has patient fallen in last 6 months?  No  LIVING ENVIRONMENT: Lives with: lives with their spouse Lives in: House/apartment  PLOF:  Level of assistance: Independent with ADLs, Independent with IADLs Employment: Part-time employment   PATIENT GOALS to be able to communicate again  OBJECTIVE:   COGNITION: Overall cognitive status: Difficulty to assess due to: Communication impairment Functional abilities include selective attention, self-awareness, basic problem solving  AUDITORY COMPREHENSION: Overall auditory comprehension: Impaired: simple YES/NO questions: Impaired: simple Following directions: Impaired: simple Conversation: Simple Interfering components:  N/A Effective technique: extra processing time, repetition/stressing words, slowed speech, written cues, and visual/gestural cues   READING COMPREHENSION: Impaired: phrase and sentence  EXPRESSION: verbal and nonverbal- gestures  VERBAL EXPRESSION: Level of generative/spontaneous verbalization: word Automatic speech: None observed  Repetition: Impaired: phrase Naming: Responsive: 0-25%, Confrontation: 0-25%, Convergent: 0-25%, and Divergent: 0-25% Pragmatics: Appears intact Comments: minimal verbal output observed Interfering components:  N/A Effective technique: open ended questions, semantic cues, sentence completion, phonemic cues, and articulatory cues Non-verbal means of communication: gestures  WRITTEN EXPRESSION: Dominant hand: right  Written expression: Not tested  MOTOR SPEECH: Overall motor speech: impaired Level of impairment: Word Respiration: diaphragmatic/abdominal breathing Phonation: normal Resonance: WFL Articulation:  suspicious for apraxia of speech Intelligibility: Unable to assess d/t severity of expressive aphasia Motor planning: Impaired: unaware, groping for words, and inconsistent Motor speech errors: unaware, groping for words, and inconsistent Interfering  components:  expressive language abilities Effective technique:  N/A   ORAL MOTOR EXAMINATION Facial : WFL Lingual: WFL Velum: WFL Mandible: WFL Cough: WFL Voice: WFL   STANDARDIZED ASSESSMENTS:   Western Aphasia Battery- Revised  Spontaneous Speech                           Information content               0/10                                            Fluency                                 0/10                                          Comprehension     Yes/No questions                 48/60                                           Auditory Word  Recognition  34/60                                Sequential Commands       2/80                              Repetition                             2/100                                        Naming    Object Naming                     0/60                                           Word Fluency                        0/20                                            Sentence Completion          0/10                                             Responsive Speech              0/10                                         Aphasia Quotient                  8.8/100         Pt's severity rating was very severe as indicated by an Aphasia Quotient of 8.8 (0-25=very severe, 26-50=severe, 51-75=moderate, 76 and above is mild). 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 Reading and writing were also impaired.    PATIENT REPORTED OUTCOME MEASURES (PROM): Will assess during next visit   TODAY'S TREATMENT:  N/A   PATIENT EDUCATION: Education details: Left MCA CVA, aphasia Person educated: Patient and Spouse Education method: Explanation, Verbal cues, and Handouts Education comprehension: verbalized understanding and needs further education   GOALS: Goals reviewed with patient?  Yes  SHORT TERM GOALS: Target date: 10 sessions  Given moderate assistance, pt will answer simple basic yes/no questions with 50% accuracy.  Baseline: 25% Goal status: INITIAL  2.  Given moderate assistance, pt will follow simple basic 1 step directions with 50% accuracy.  Baseline: 0% Goal status: INITIAL  3.  With maximal multimodal cues, pt will name set of 10 common objects in 8 out of  10 opportunities.  Baseline: unable Goal status: INITIAL  4. Pt will improve reading abilities by placing phrase level object function with object in field of 3 with 50% ability and moderate cues.   Baseline: object name to object - 90%  Goal status: INITIAL  5. Pt will improve spelling abilities by completing common words by filling in missing letter with 50% ability and moderate cues.   Baseline: unable  Goal status: INITIAL  6. Pt will repeat rote phrases with 25% speech intelligibility given max to moderate cues.   Baseline: unable  Goal status: INITIAL  LONG TERM GOALS: Target date: 08/26/2021  Pt will use multimodal communication to express basic wants/needs in 4 of 10 opportunities.  Baseline: unable Goal status: INITIAL  2.  Pt will demonstrate increased reading comprehension by selecting accurate answer from 3 choices in 2 out of 10 opportunities.  Baseline: unable Goal status: INITIAL  3.  Pt will correctly spell his name in 5 out of 10 opportunities.  Baseline: unable Goal status: INITIAL   ASSESSMENT:  CLINICAL IMPRESSION: Patient is a 76 y.o. male who was seen today for a speech-language evaluation d/t recent left MCA CVA. Pt presents with severe expressive impairments limited to yes/no, this/that verbalizations; moderate receptive language impairments impacting his ability to answer semi-complex yes/no questions and follow 1 step directions was well as understand auditory instructions. He demonstrates the ability to match printed object name to object in field of 3 and is  able to select printed word from field of 3 given auditory word. Writing was not assessed during this evaluation. Apraxia of speech cannot be ruled out at this time.   Pt does exhibit good attention, basic problem solving and is aware of his errors.   OBJECTIVE IMPAIRMENTS include expressive language, receptive language, and aphasia. These impairments are limiting patient from managing medications, managing appointments, managing finances, household responsibilities, ADLs/IADLs, and effectively communicating at home and in community. Factors affecting potential to achieve goals and functional outcome are severity of impairments. Patient will benefit from skilled SLP services to address above impairments and improve overall function.  REHAB POTENTIAL: Excellent  PLAN: SLP FREQUENCY: 3x/week  SLP DURATION: 12 weeks  PLANNED INTERVENTIONS: Language facilitation, Environmental controls, Cueing hierachy, Internal/external aids, Functional tasks, Multimodal communication approach, SLP instruction and feedback, Compensatory strategies, and Patient/family education  Aki Abalos B. Rutherford Nail, M.S., CCC-SLP, Mining engineer Certified Brain Injury [place]  [place] Office [phone] Ascom [phone] Fax [phone]

## 2022-06-04 NOTE — Progress Notes (Signed)
Follow-Up Visit   Subjective  Alan Stewart is a 76 y.o. male who presents for the following: Annual Exam (TBSE and recheck left dorsal forearm, SCC Tx with [facility] 05/16/2022).  Hx of CVA 05/20/2022. MI 10/2021.  Patient accompanied by wife, who contributes to history.   The patient presents for Total-Body Skin Exam (TBSE) for skin cancer screening and mole check.  The patient has spots, moles and lesions to be evaluated, some may be new or changing and the patient has concerns that these could be cancer.   The following portions of the chart were reviewed this encounter and updated as appropriate:  Tobacco  Allergies  Meds  Problems  Med Hx  Surg Hx  Fam Hx      Review of Systems: No other skin or systemic complaints except as noted in HPI or Assessment and Plan.   Objective  Well appearing patient in no apparent distress; mood and affect are within normal limits.  A full examination was performed including scalp, head, eyes, ears, nose, lips, neck, chest, axillae, abdomen, back, buttocks, bilateral upper extremities, bilateral lower extremities, hands, feet, fingers, toes, fingernails, and toenails. All findings within normal limits unless otherwise noted below.  left dorsal hand x4. left forearm x3, left dorsal hand x2, right forearm x3, right temple x1, right forehead x1, right nose x1, right lateral neck x1 (16) Erythematous thin papules/macules with gritty scale.   Right 2nd Finger proximal 0.9cm cutaneous horn      Assessment & Plan   History of Squamous Cell Carcinoma of the Skin. Left dorsal forearm. 05/16/2022. - No evidence of recurrence today - No lymphadenopathy - Recommend regular full body skin exams - Recommend daily broad spectrum sunscreen SPF 30+ to sun-exposed areas, reapply every 2 hours as needed.  - Call if any new or changing lesions are noted between office visits   Lentigines - Scattered tan macules - Due to sun exposure - Benign-appearing,  observe - Recommend daily broad spectrum sunscreen SPF 30+ to sun-exposed areas, reapply every 2 hours as needed. - Call for any changes  Seborrheic Keratoses - Stuck-on, waxy, tan-brown papules and/or plaques  - Benign-appearing - Discussed benign etiology and prognosis. - Observe - Call for any changes  Melanocytic Nevi - Tan-brown and/or pink-flesh-colored symmetric macules and papules - Benign appearing on exam today - Observation - Call clinic for new or changing moles - Recommend daily use of broad spectrum spf 30+ sunscreen to sun-exposed areas.   Hemangiomas - Red papules - Discussed benign nature - Observe - Call for any changes  Actinic Damage - Chronic condition, secondary to cumulative UV/sun exposure - diffuse scaly erythematous macules with underlying dyspigmentation - Recommend daily broad spectrum sunscreen SPF 30+ to sun-exposed areas, reapply every 2 hours as needed.  - Staying in the shade or wearing long sleeves, sun glasses (UVA+UVB protection) and wide brim hats (4-inch brim around the entire circumference of the hat) are also recommended for sun protection.  - Call for new or changing lesions.  Skin cancer screening performed today.  AK (actinic keratosis) (16) left dorsal hand x4. left forearm x3, left dorsal hand x2, right forearm x3, right temple x1, right forehead x1, right nose x1, right lateral neck x1  Actinic keratoses are precancerous spots that appear secondary to cumulative UV radiation exposure/sun exposure over time. They are chronic with expected duration over 1 year. A portion of actinic keratoses will progress to squamous cell carcinoma of the skin. It is not  possible to reliably predict which spots will progress to skin cancer and so treatment is recommended to prevent development of skin cancer.  Recommend daily broad spectrum sunscreen SPF 30+ to sun-exposed areas, reapply every 2 hours as needed.  Recommend staying in the shade or  wearing long sleeves, sun glasses (UVA+UVB protection) and wide brim hats (4-inch brim around the entire circumference of the hat). Call for new or changing lesions.  Destruction of lesion - left dorsal hand x4. left forearm x3, left dorsal hand x2, right forearm x3, right temple x1, right forehead x1, right nose x1, right lateral neck x1  Destruction method: cryotherapy   Informed consent: discussed and consent obtained   Lesion destroyed using liquid nitrogen: Yes   Region frozen until ice ball extended beyond lesion: Yes   Outcome: patient tolerated procedure well with no complications   Post-procedure details: wound care instructions given   Additional details:  Prior to procedure, discussed risks of blister formation, small wound, skin dyspigmentation, or rare scar following cryotherapy. Recommend Vaseline ointment to treated areas while healing.   Neoplasm of skin Right 2nd Finger proximal  Skin / nail biopsy Type of biopsy: tangential   Informed consent: discussed and consent obtained   Anesthesia: the lesion was anesthetized in a standard fashion   Anesthesia comment:  Area prepped with alcohol Anesthetic:  1% lidocaine w/ epinephrine 1-100,000 buffered w/ 8.4% NaHCO3 Instrument used: flexible razor blade   Hemostasis achieved with: pressure, aluminum chloride and electrodesiccation   Outcome: patient tolerated procedure well   Post-procedure details: wound care instructions given   Post-procedure details comment:  Ointment and small bandage applied  Specimen 1 - Surgical pathology Differential Diagnosis: R/O SCC  Check Margins: No   Return for AK Follow Up 3-4 months.  I, Emelia Salisbury, CMA, am acting as scribe for Forest Gleason, MD.  Documentation: I have reviewed the above documentation for accuracy and completeness, and I agree with the above.  Forest Gleason, MD

## 2022-06-05 ENCOUNTER — Ambulatory Visit: Payer: Medicare HMO | Admitting: Speech Pathology

## 2022-06-05 DIAGNOSIS — R4701 Aphasia: Secondary | ICD-10-CM | POA: Diagnosis not present

## 2022-06-05 DIAGNOSIS — I6602 Occlusion and stenosis of left middle cerebral artery: Secondary | ICD-10-CM

## 2022-06-05 DIAGNOSIS — R482 Apraxia: Secondary | ICD-10-CM

## 2022-06-05 DIAGNOSIS — I639 Cerebral infarction, unspecified: Secondary | ICD-10-CM | POA: Diagnosis not present

## 2022-06-05 DIAGNOSIS — R278 Other lack of coordination: Secondary | ICD-10-CM | POA: Diagnosis not present

## 2022-06-05 DIAGNOSIS — R262 Difficulty in walking, not elsewhere classified: Secondary | ICD-10-CM | POA: Diagnosis not present

## 2022-06-05 DIAGNOSIS — M6281 Muscle weakness (generalized): Secondary | ICD-10-CM | POA: Diagnosis not present

## 2022-06-05 DIAGNOSIS — R41841 Cognitive communication deficit: Secondary | ICD-10-CM | POA: Diagnosis not present

## 2022-06-06 ENCOUNTER — Ambulatory Visit: Payer: Medicare HMO | Admitting: Speech Pathology

## 2022-06-07 ENCOUNTER — Ambulatory Visit: Payer: Medicare HMO | Admitting: Speech Pathology

## 2022-06-07 DIAGNOSIS — R262 Difficulty in walking, not elsewhere classified: Secondary | ICD-10-CM | POA: Diagnosis not present

## 2022-06-07 DIAGNOSIS — R482 Apraxia: Secondary | ICD-10-CM

## 2022-06-07 DIAGNOSIS — I6602 Occlusion and stenosis of left middle cerebral artery: Secondary | ICD-10-CM

## 2022-06-07 DIAGNOSIS — R4701 Aphasia: Secondary | ICD-10-CM

## 2022-06-07 DIAGNOSIS — I639 Cerebral infarction, unspecified: Secondary | ICD-10-CM | POA: Diagnosis not present

## 2022-06-07 DIAGNOSIS — R41841 Cognitive communication deficit: Secondary | ICD-10-CM | POA: Diagnosis not present

## 2022-06-07 DIAGNOSIS — R278 Other lack of coordination: Secondary | ICD-10-CM | POA: Diagnosis not present

## 2022-06-07 DIAGNOSIS — M6281 Muscle weakness (generalized): Secondary | ICD-10-CM | POA: Diagnosis not present

## 2022-06-07 NOTE — Therapy (Signed)
OUTPATIENT SPEECH LANGUAGE PATHOLOGY TREATMENT NOTE   Patient Name: Alan Stewart MRN: [id number] DOB:Jun 22, 1946, 76 y.o., male Today's Date: 06/07/2022  PCP: Ramonita Lab, MD REFERRING PROVIDER: Ramonita Lab, MD  END OF SESSION:   End of Session - 06/07/22 2143     Visit Number 2    Number of Visits 25    Date for SLP Re-Evaluation 08/26/22    Authorization Type [employer] HMO    Progress Note Due on Visit 10    SLP Start Time 1000    SLP Stop Time  1100    SLP Time Calculation (min) 60 min    Activity Tolerance Patient tolerated treatment well             Past Medical History:  Diagnosis Date   CAD (coronary artery disease)    a. 10/2021 NSTEMI/PCI: LM nl, LAD min irregs, LCX 32m(2.5x18 Onyx Frontier DES), OM1 nl, RCA 974m3.5x26 Onyx Frontier DES), 30d.   Diastolic dysfunction    a. 10/2021 Echo: EF 50-55%, no rwma, Gr1 DD, nl RV fxn. No significant valvular dzs.   Hyperglycemia    Hyperlipidemia    Hypertension    Morbid obesity (H[place]   Osteoarthritis    SCC (squamous cell carcinoma) 05/16/2022   left dorsal forearm, EDC   Sleep apnea    Past Surgical History:  Procedure Laterality Date   COLONOSCOPY N/A 10/09/2015   Procedure: COLONOSCOPY;  Surgeon: PaHulen LusterMD;  [facility];  Service: Gastroenterology;  Laterality: N/A;   CORONARY STENT INTERVENTION N/A 10/22/2021   Procedure: CORONARY STENT INTERVENTION;  Surgeon: ArWellington HampshireMD;  [facility];  Service: Cardiovascular;  Laterality: N/A;   IR CT HEAD LTD  05/21/2022   IR PERCUTANEOUS ART THROMBECTOMY/INFUSION INTRACRANIAL INC DIAG ANGIO  05/21/2022   LEFT HEART CATH AND CORONARY ANGIOGRAPHY N/A 10/22/2021   Procedure: LEFT HEART CATH AND CORONARY ANGIOGRAPHY;  Surgeon: ArWellington HampshireMD;  [place] [facility];  Service: Cardiovascular;  Laterality: N/A;   LOOP RECORDER INSERTION N/A 05/23/2022   Procedure: LOOP RECORDER INSERTION;  Surgeon: LaVickie EpleyMD;  [facility];  Service: Cardiovascular;  Laterality: N/A;   RADIOLOGY WITH ANESTHESIA N/A 05/20/2022   Procedure: IR WITH ANESTHESIA;  Surgeon: DeLuanne BrasMD;  [facility] Service: Radiology;  Laterality: N/A;   ROTATOR CUFF REPAIR     Patient Active Problem List   Diagnosis Date Noted   Stroke (cerebrum) (HCL[place]10/10/2021   Middle cerebral artery embolism, 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 male with PMH: HTN, CAD, HLD, osteoarthritis, SCC, sleep apnea. He presented to the hospital on 05/20/22 with right sided weakness and aphasia with LKW reported by wife to be 10pm on 10/2.  [facility] and [facility] for emergent CT/CTA which demonstrated left M1 occlusion. He received IV tenecteplase and IR thrombectomy due to large vessel occlusion.       DIAGNOSTIC FINDINGS:  MRI 05/22/2022 Cortical ischemia throughout the left MCA territory compatible with recent  infarct. Relatively little affect on the white matter. 2. Small focus of acute hemorrhage along the anterior surface of the left temporal lobe Heidelberg classification 3c: Subarachnoid hemorrhage. 3. Normal intracranial MRA.  Restored flow in left MCA.    THERAPY DIAG:  Aphasia  Apraxia  Middle cerebral artery embolism, left  Rationale for Evaluation and Treatment Rehabilitation  SUBJECTIVE: pt eager, answers in one word responses  Pt accompanied by: self and significant other  PAIN:  Are you having pain? No  PATIENT GOALS: to be able to communication again  OBJECTIVE:    TODAY'S TREATMENT: Skilled treatment session focused on pt's communication goals. SLP facilitated session by providing the following interventions:  With moderate assistance, pt able to count pennies thru 10 with 100% accuracy  With written numbers 1-5, pt able to put into correct order; maximal multimodal assistance provided to answer "what number is this?" While pointing to number. Pt counted the pennies instead of answering with number.   Maximal multimodal assistance provided with pt able to say his name in 6 out of 8 opportunities   PATIENT EDUCATION: Education details: language impairment, education provided in location of pt's stroke Person educated: Patient and Parent Education method: Explanation, Media planner, Verbal cues, and Handouts Education comprehension: verbalized understanding  GOALS: Goals reviewed with patient? Yes  SHORT TERM GOALS: Target date: 10 sessions  Given moderate assistance, pt will answer simple basic yes/no questions with 50% accuracy.  Baseline: 25% Goal status: INITIAL   2.  Given moderate assistance, pt will follow simple basic 1 step directions with 50% accuracy.  Baseline: 0% Goal status: INITIAL   3.  With maximal multimodal cues, pt will name set of 10 common objects in 8 out of 10 opportunities.  Baseline: unable Goal status: INITIAL   4. Pt will improve reading abilities by placing phrase level object function with object in field of 3 with 50% ability and moderate cues.              Baseline: object name to object - 90%             Goal status: INITIAL   5. Pt will improve spelling abilities by completing common words by filling in missing letter with 50% ability and moderate cues.              Baseline: unable             Goal status: INITIAL   6. Pt will repeat rote phrases with 25% speech intelligibility given max to moderate cues.              Baseline: unable             Goal status: INITIAL   LONG TERM GOALS: Target  date: 08/26/2021   Pt will use multimodal communication to express basic wants/needs in 4 of 10 opportunities.  Baseline: unable Goal status: INITIAL   2.  Pt will demonstrate increased reading comprehension by selecting accurate answer from 3 choices in 2 out of 10 opportunities.  Baseline: unable Goal status: INITIAL   3.  Pt will correctly spell his name in 5 out of 10 opportunities.  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 Pt is dependent on his wife to figure out what is trying to be said.   OBJECTIVE IMPAIRMENTS include expressive language, receptive language, aphasia, and  apraxia. These impairments are limiting patient from managing medications, managing appointments, managing finances, household responsibilities, ADLs/IADLs, and effectively communicating at home and in community. Factors affecting potential to achieve goals and functional outcome are severity of impairments. Patient will benefit from skilled SLP services to address above impairments and improve overall function.  REHAB POTENTIAL: Excellent  PLAN: SLP FREQUENCY: 3x/week  SLP DURATION: 12 weeks  PLANNED INTERVENTIONS: Language facilitation, Environmental controls, Internal/external aids, Functional tasks, Multimodal communication approach, SLP instruction and feedback, Compensatory strategies, and Patient/family education   Orlene Salmons B. Rutherford Nail, M.S., CCC-SLP, Mining engineer Certified Brain Injury [place]  [place] Office [phone] Ascom [phone] Fax [phone]

## 2022-06-10 ENCOUNTER — Telehealth: Payer: Self-pay

## 2022-06-10 ENCOUNTER — Ambulatory Visit: Payer: Medicare HMO | Admitting: Speech Pathology

## 2022-06-10 DIAGNOSIS — R482 Apraxia: Secondary | ICD-10-CM | POA: Diagnosis not present

## 2022-06-10 DIAGNOSIS — R4701 Aphasia: Secondary | ICD-10-CM

## 2022-06-10 DIAGNOSIS — I6602 Occlusion and stenosis of left middle cerebral artery: Secondary | ICD-10-CM

## 2022-06-10 DIAGNOSIS — R278 Other lack of coordination: Secondary | ICD-10-CM | POA: Diagnosis not present

## 2022-06-10 DIAGNOSIS — R41841 Cognitive communication deficit: Secondary | ICD-10-CM | POA: Diagnosis not present

## 2022-06-10 DIAGNOSIS — R262 Difficulty in walking, not elsewhere classified: Secondary | ICD-10-CM | POA: Diagnosis not present

## 2022-06-10 DIAGNOSIS — M6281 Muscle weakness (generalized): Secondary | ICD-10-CM | POA: Diagnosis not present

## 2022-06-10 DIAGNOSIS — I639 Cerebral infarction, unspecified: Secondary | ICD-10-CM | POA: Diagnosis not present

## 2022-06-10 NOTE — Telephone Encounter (Signed)
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 It leaves a round white scar. No additional pathology is done. If the skin cancer were to come back, we would need to do a surgery to remove it.   Mohs involves cutting out right around the spot and then checking under the microscope to be sure the whole skin cancer is out. The cure rate is about 98-99%. It is done at another office outside of [place] ([place], [place], or [place]). Once the Mohs surgeon confirms the skin cancer is out, they will discuss the options to repair or heal the area. You must take it easy for about two weeks after surgery (no lifting over 10-15 lbs, avoid activity to get your heart rate and blood pressure up).    MAs please call with results and schedule/refer. Please let me know if they have questions for me. Thank you!   

## 2022-06-10 NOTE — Therapy (Signed)
OUTPATIENT SPEECH LANGUAGE PATHOLOGY TREATMENT NOTE   Patient Name: Alan Stewart MRN: [id number] DOB:Dec 28, 1945, 76 y.o., male Today's Date: 06/10/2022  PCP: Ramonita Lab, MD REFERRING PROVIDER: Ramonita Lab, MD  END OF SESSION:   End of Session - 06/10/22 0836     Visit Number 3    Number of Visits 25    Date for SLP Re-Evaluation 08/26/22    Authorization Type [employer] HMO    Progress Note Due on Visit 10    SLP Start Time 0900    SLP Stop Time  1000    SLP Time Calculation (min) 60 min    Activity Tolerance Patient tolerated treatment well             Past Medical History:  Diagnosis Date   CAD (coronary artery disease)    a. 10/2021 NSTEMI/PCI: LM nl, LAD min irregs, LCX 54m(2.5x18 Onyx Frontier DES), OM1 nl, RCA 959m3.5x26 Onyx Frontier DES), 30d.   Diastolic dysfunction    a. 10/2021 Echo: EF 50-55%, no rwma, Gr1 DD, nl RV fxn. No significant valvular dzs.   Hyperglycemia    Hyperlipidemia    Hypertension    Morbid obesity ([facility]   Osteoarthritis    SCC (squamous cell carcinoma) 05/16/2022   left dorsal forearm, EDC   Sleep apnea    Past Surgical History:  Procedure Laterality Date   COLONOSCOPY N/A 10/09/2015   Procedure: COLONOSCOPY;  Surgeon: PaHulen LusterMD;  [facility];  Service: Gastroenterology;  Laterality: N/A;   CORONARY STENT INTERVENTION N/A 10/22/2021   Procedure: CORONARY STENT INTERVENTION;  Surgeon: ArWellington HampshireMD;  [facility];  Service: Cardiovascular;  Laterality: N/A;   IR CT HEAD LTD  05/21/2022   IR PERCUTANEOUS ART THROMBECTOMY/INFUSION INTRACRANIAL INC DIAG ANGIO  05/21/2022   LEFT HEART CATH AND CORONARY ANGIOGRAPHY N/A 10/22/2021   Procedure: LEFT HEART CATH AND CORONARY ANGIOGRAPHY;  Surgeon: ArWellington HampshireMD;  [facility];  Service: Cardiovascular;  Laterality: N/A;   LOOP RECORDER INSERTION N/A 05/23/2022   Procedure: LOOP RECORDER INSERTION;  Surgeon: LaVickie EpleyMD;  [place];  Service: Cardiovascular;  Laterality: N/A;   RADIOLOGY WITH ANESTHESIA N/A 05/20/2022   Procedure: IR WITH ANESTHESIA;  Surgeon: DeLuanne BrasMD;  [place] [facility] Service: Radiology;  Laterality: N/A;   ROTATOR CUFF REPAIR     Patient Active Problem List   Diagnosis Date Noted   Stroke (cerebrum) (HCWoodsburgh10/10/2021   Middle cerebral artery embolism, 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 male with PMH: HTN, CAD, HLD, osteoarthritis, SCC, sleep apnea. He presented to the hospital on 05/20/22 with right sided weakness and aphasia with LKW reported by wife to be 10pm on 10/2.  [facility]/CTA which demonstrated left M1 occlusion. He received IV tenecteplase and IR thrombectomy due to large vessel occlusion.       DIAGNOSTIC FINDINGS:  MRI 05/22/2022 Cortical ischemia throughout the left MCA territory compatible with recent  infarct. Relatively little affect on the white matter. 2. Small focus of acute hemorrhage along the anterior surface of the left temporal lobe Heidelberg classification 3c: Subarachnoid hemorrhage. 3. Normal intracranial MRA.  Restored flow in left MCA.    THERAPY DIAG:  Aphasia  Apraxia  Middle cerebral artery embolism, left  Rationale for Evaluation and Treatment Rehabilitation  SUBJECTIVE: pt eager, answers in one word responses  Pt accompanied by: self and significant other  PAIN:  Are you having pain? No  PATIENT GOALS: to be able to communication again  OBJECTIVE:    TODAY'S TREATMENT: Skilled treatment session focused on pt's communication goals. SLP facilitated session by providing the following interventions:     Pt showed this Probation officer several apps on his phone demonstrating cognitive problem solving, attention and error awareness within normal limits.    When [employer], pt demonstrated improved spelling abilities as well as letter selection based on auditory only (75% accuracy). When completing [employer], pt demonstrating improved reading comprehension with 75% accuracy.   To increase letter recognition words along with hand drawn picture were presented with first letter missing (_ at) with pt able to select first letter from field of 6 to spell the following words  Rat  Cat  Bat  Mat  Sat  Fat   PATIENT EDUCATION: Education details: how to facilitate language production thru activities, auditory bombardment during spelling tasks Person educated: Patient and Parent Education method: Explanation, Demonstration, Verbal cues, and Handouts Education comprehension: verbalized understanding  GOALS: Goals reviewed with patient? Yes  SHORT TERM GOALS: Target date: 10 sessions  Given moderate assistance, pt will answer simple basic yes/no questions with 50% accuracy.  Baseline: 25% Goal status: INITIAL   2.  Given moderate assistance, pt will follow simple basic 1 step directions with 50% accuracy.  Baseline: 0% Goal status: INITIAL   3.  With maximal multimodal cues, pt will name set of 10 common objects in 8 out of 10 opportunities.  Baseline: unable Goal status: INITIAL   4. Pt will improve reading abilities by placing phrase level object function with object in field of 3 with 50% ability and moderate cues.              Baseline: object name to object - 90%             Goal status: INITIAL   5. Pt will improve spelling abilities by completing common words by filling in missing letter with 50% ability and moderate  cues.              Baseline: unable             Goal status: INITIAL   6. Pt will repeat rote phrases with 25% speech intelligibility given max to moderate cues.              Baseline: unable             Goal status: INITIAL   LONG TERM GOALS: Target date: 08/26/2021   Pt will use multimodal communication to express basic wants/needs in 4 of 10 opportunities.  Baseline: unable Goal status: INITIAL   2.  Pt will demonstrate increased reading comprehension by selecting accurate answer from 3 choices in 2 out of 10 opportunities.  Baseline: unable Goal status: INITIAL   3.  Pt will correctly spell his name in 5 out of 10 opportunities.  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 Pt is dependent on his wife to figure out what is trying to be said and she reports increased pt frustration especially when asking about the TV guide. SLP requested pt's wife email list of TV channels. SLP to provide low tech support for information.    OBJECTIVE IMPAIRMENTS include expressive language, receptive language, aphasia, and apraxia. These impairments are limiting patient from managing medications, managing appointments, managing finances, household responsibilities, ADLs/IADLs, and effectively communicating at home and in community. Factors affecting potential to achieve goals and functional outcome are severity of impairments. Patient will benefit from skilled SLP services to address above impairments and improve overall function.  REHAB POTENTIAL: Excellent  PLAN: SLP FREQUENCY: 3x/week  SLP DURATION: 12 weeks  PLANNED INTERVENTIONS: Language facilitation, Environmental controls, Internal/external aids, Functional tasks, Multimodal communication approach, SLP instruction and feedback, Compensatory strategies,  and Patient/family education   Tamer Baughman B. Rutherford Nail, M.S., CCC-SLP, Mining engineer Certified Brain Injury [place]  [place] Office [phone] Ascom [phone] Fax [phone]

## 2022-06-10 NOTE — Therapy (Signed)
OUTPATIENT SPEECH LANGUAGE PATHOLOGY TREATMENT NOTE   Patient Name: Alan Stewart MRN: [id number] DOB:1946-01-29, 76 y.o., male Today's Date: 06/10/2022  PCP: Ramonita Lab, MD REFERRING PROVIDER: Ramonita Lab, MD  END OF SESSION:   End of Session - 06/10/22 1106     Visit Number 4    Number of Visits 25    Date for SLP Re-Evaluation 08/26/22    Authorization Type [employer] HMO    Progress Note Due on Visit 10    SLP Start Time 1000    SLP Stop Time  1100    SLP Time Calculation (min) 60 min    Activity Tolerance Patient tolerated treatment well             Past Medical History:  Diagnosis Date   CAD (coronary artery disease)    a. 10/2021 NSTEMI/PCI: LM nl, LAD min irregs, LCX 75m(2.5x18 Onyx Frontier DES), OM1 nl, RCA 964m3.5x26 Onyx Frontier DES), 30d.   Diastolic dysfunction    a. 10/2021 Echo: EF 50-55%, no rwma, Gr1 DD, nl RV fxn. No significant valvular dzs.   Hyperglycemia    Hyperlipidemia    Hypertension    Morbid obesity (HCHarlan   Osteoarthritis    SCC (squamous cell carcinoma) 05/16/2022   left dorsal forearm, EDC   Sleep apnea    Past Surgical History:  Procedure Laterality Date   COLONOSCOPY N/A 10/09/2015   Procedure: COLONOSCOPY;  Surgeon: PaHulen LusterMD;  [facility];  Service: Gastroenterology;  Laterality: N/A;   CORONARY STENT INTERVENTION N/A 10/22/2021   Procedure: CORONARY STENT INTERVENTION;  Surgeon: ArWellington HampshireMD;  [facility];  Service: Cardiovascular;  Laterality: N/A;   IR CT HEAD LTD  05/21/2022   IR PERCUTANEOUS ART THROMBECTOMY/INFUSION INTRACRANIAL INC DIAG ANGIO  05/21/2022   LEFT HEART CATH AND CORONARY ANGIOGRAPHY N/A 10/22/2021   Procedure: LEFT HEART CATH AND CORONARY ANGIOGRAPHY;  Surgeon: ArWellington HampshireMD;  Location: [facility];  Service: Cardiovascular;  Laterality: N/A;   LOOP RECORDER INSERTION N/A 05/23/2022   Procedure: LOOP RECORDER INSERTION;  Surgeon: LaVickie EpleyMD;  [place] [facility];  Service: Cardiovascular;  Laterality: N/A;   RADIOLOGY WITH ANESTHESIA N/A 05/20/2022   Procedure: IR WITH ANESTHESIA;  Surgeon: DeLuanne BrasMD;  [place] [facility] Service: Radiology;  Laterality: N/A;   ROTATOR CUFF REPAIR     Patient Active Problem List   Diagnosis Date Noted   Stroke (cerebrum) (HCScott10/10/2021   Middle cerebral artery embolism, 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 male with PMH: HTN, CAD, HLD, osteoarthritis, SCC, sleep apnea. He presented to the hospital on 05/20/22 with right sided weakness and aphasia with LKW reported by wife to be 10pm on 10/2.  [facility] which demonstrated left M1 occlusion. He received IV tenecteplase and IR thrombectomy due to large vessel occlusion.       DIAGNOSTIC FINDINGS:  MRI 05/22/2022 Cortical ischemia throughout the left MCA territory compatible with recent  infarct. Relatively little affect on the white matter. 2. Small focus of acute hemorrhage along the anterior surface of the left temporal lobe Heidelberg classification 3c: Subarachnoid hemorrhage. 3. Normal intracranial MRA.  Restored flow in left MCA.    THERAPY DIAG:  Aphasia  Apraxia  Middle cerebral artery embolism, left  Rationale for Evaluation and Treatment Rehabilitation  SUBJECTIVE: wife reports that pt has been "cranky especially yesterday" - referring to communication breakdowns and attempts to communicate  Pt accompanied by: self and significant other  PAIN:  Are you  having pain? No  PATIENT GOALS: to be able to communicate again  OBJECTIVE:   TODAY'S TREATMENT: Skilled treatment session focused on pt's communication goals. SLP facilitated session by providing the following interventions:    Pt is unaware of verbal errors perseverative on "str, strs" but was able to recognize errors once written for pt  Pt took pencil and wrote down channel numbers  Independently used phone during communication breakdown to show football team that they watched on Sunday - very successful repair of breakdown  In addition, SLP created a low tech "channel guide" for programs on [employer] and [employer] being with 8pm show for tonight. Pictures helpful in pt establishing subject of conversation such as pointing to picture of Monday night football for conversation about the teams playing  Pt was able to match written name of show to picture in field of 3 with 100% Given auditory name of show able to select picture in field of 3 with 100% Given auditory, able to select written name in field of 3 with 100%        PATIENT EDUCATION: Education details: how to facilitate language production thru activities, auditory bombardment during spelling tasks Person educated: Patient and Parent Education method: Explanation, Demonstration, Verbal cues, and Handouts Education comprehension: verbalized understanding  GOALS: Goals reviewed with patient? Yes  SHORT TERM GOALS: Target date: 10 sessions  Given moderate assistance, pt will answer simple basic yes/no questions with 50% accuracy.  Baseline: 25% Goal status: INITIAL   2.  Given moderate assistance, pt will follow simple basic 1 step directions with 50% accuracy.  Baseline: 0% Goal status: INITIAL   3.  With maximal multimodal cues, pt will name set of 10 common objects in 8 out of 10 opportunities.  Baseline: unable Goal status: INITIAL   4. Pt will improve reading abilities by placing phrase level object function with object  in field of 3 with 50% ability and moderate cues.              Baseline: object name to object - 90%             Goal status: INITIAL   5. Pt will improve spelling abilities by completing common words by filling in missing letter with 50% ability and moderate cues.              Baseline: unable             Goal status: INITIAL   6. Pt will repeat rote phrases with 25% speech intelligibility given max to moderate cues.              Baseline: unable             Goal status: INITIAL   LONG TERM GOALS: Target date: 08/26/2021   Pt will use multimodal communication to express basic wants/needs in 4 of 10 opportunities.  Baseline: unable Goal status: INITIAL   2.  Pt will demonstrate increased  reading comprehension by selecting accurate answer from 3 choices in 2 out of 10 opportunities.  Baseline: unable Goal status: INITIAL   3.  Pt will correctly spell his name in 5 out of 10 opportunities.  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 Pt is dependent on his wife to figure out what is trying to be said and she reports increased pt frustration. Recommend she give pt paper and pencil to see what pt is able to convey. 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   OBJECTIVE IMPAIRMENTS include expressive language, receptive language, aphasia, and apraxia. These impairments are limiting patient from managing medications, managing appointments, managing finances, household responsibilities, ADLs/IADLs, and effectively communicating at home and in community. Factors affecting potential to achieve goals and functional outcome are severity of impairments. Patient will benefit from skilled SLP services to address above impairments and improve overall function.  REHAB POTENTIAL: Excellent  PLAN: SLP FREQUENCY:  3x/week  SLP DURATION: 12 weeks  PLANNED INTERVENTIONS: Language facilitation, Environmental controls, Internal/external aids, Functional tasks, Multimodal communication approach, SLP instruction and feedback, Compensatory strategies, and Patient/family education   Finnis Colee B. Rutherford Nail, M.S., CCC-SLP, Mining engineer Certified Brain Injury [place]  [place] Office [phone] Ascom [phone] Fax [phone]

## 2022-06-10 NOTE — Telephone Encounter (Signed)
Called patient's wife, Bonnita Nasuti. N/A. LMOVM to return my call regarding pathology results and Tx options.

## 2022-06-11 ENCOUNTER — Ambulatory Visit: Payer: Medicare HMO

## 2022-06-12 ENCOUNTER — Encounter: Payer: Self-pay | Admitting: Dermatology

## 2022-06-13 ENCOUNTER — Ambulatory Visit: Payer: Medicare HMO | Admitting: Speech Pathology

## 2022-06-13 DIAGNOSIS — R41841 Cognitive communication deficit: Secondary | ICD-10-CM | POA: Diagnosis not present

## 2022-06-13 DIAGNOSIS — R4701 Aphasia: Secondary | ICD-10-CM | POA: Diagnosis not present

## 2022-06-13 DIAGNOSIS — I6602 Occlusion and stenosis of left middle cerebral artery: Secondary | ICD-10-CM

## 2022-06-13 DIAGNOSIS — M6281 Muscle weakness (generalized): Secondary | ICD-10-CM | POA: Diagnosis not present

## 2022-06-13 DIAGNOSIS — I639 Cerebral infarction, unspecified: Secondary | ICD-10-CM | POA: Diagnosis not present

## 2022-06-13 DIAGNOSIS — R482 Apraxia: Secondary | ICD-10-CM | POA: Diagnosis not present

## 2022-06-13 DIAGNOSIS — R278 Other lack of coordination: Secondary | ICD-10-CM | POA: Diagnosis not present

## 2022-06-13 DIAGNOSIS — R262 Difficulty in walking, not elsewhere classified: Secondary | ICD-10-CM | POA: Diagnosis not present

## 2022-06-14 ENCOUNTER — Ambulatory Visit: Payer: Medicare HMO | Admitting: Speech Pathology

## 2022-06-14 ENCOUNTER — Ambulatory Visit: Payer: Medicare HMO | Admitting: Physical Therapy

## 2022-06-14 DIAGNOSIS — R482 Apraxia: Secondary | ICD-10-CM | POA: Diagnosis not present

## 2022-06-14 DIAGNOSIS — R262 Difficulty in walking, not elsewhere classified: Secondary | ICD-10-CM | POA: Diagnosis not present

## 2022-06-14 DIAGNOSIS — M6281 Muscle weakness (generalized): Secondary | ICD-10-CM | POA: Diagnosis not present

## 2022-06-14 DIAGNOSIS — R278 Other lack of coordination: Secondary | ICD-10-CM | POA: Diagnosis not present

## 2022-06-14 DIAGNOSIS — I639 Cerebral infarction, unspecified: Secondary | ICD-10-CM | POA: Diagnosis not present

## 2022-06-14 DIAGNOSIS — R4701 Aphasia: Secondary | ICD-10-CM | POA: Diagnosis not present

## 2022-06-14 DIAGNOSIS — I6602 Occlusion and stenosis of left middle cerebral artery: Secondary | ICD-10-CM | POA: Diagnosis not present

## 2022-06-14 DIAGNOSIS — R41841 Cognitive communication deficit: Secondary | ICD-10-CM | POA: Diagnosis not present

## 2022-06-14 NOTE — Therapy (Signed)
OUTPATIENT SPEECH LANGUAGE PATHOLOGY TREATMENT NOTE   Patient Name: Alan Stewart MRN: [id number] DOB:01-Nov-1945, 76 y.o., male Today's Date: 06/14/2022  PCP: Ramonita Lab, MD REFERRING PROVIDER: Ramonita Lab, MD  END OF SESSION:   End of Session - 06/14/22 0814     Visit Number 5    Number of Visits 25    Date for SLP Re-Evaluation 08/26/22    Authorization Type [employer] HMO    Progress Note Due on Visit 10    SLP Start Time 1000    SLP Stop Time  1100    SLP Time Calculation (min) 60 min    Activity Tolerance Patient tolerated treatment well             Past Medical History:  Diagnosis Date   CAD (coronary artery disease)    a. 10/2021 NSTEMI/PCI: LM nl, LAD min irregs, LCX 13m(2.5x18 Onyx Frontier DES), OM1 nl, RCA 98m3.5x26 Onyx Frontier DES), 30d.   Diastolic dysfunction    a. 10/2021 Echo: EF 50-55%, no rwma, Gr1 DD, nl RV fxn. No significant valvular dzs.   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 SCCis. Tx pending   Past Surgical History:  Procedure Laterality Date   COLONOSCOPY N/A 10/09/2015   Procedure: COLONOSCOPY;  Surgeon: PaHulen LusterMD;  [facility];  Service: Gastroenterology;  Laterality: N/A;   CORONARY STENT INTERVENTION N/A 10/22/2021   Procedure: CORONARY STENT INTERVENTION;  Surgeon: ArWellington HampshireMD;  Location: [facility];  Service: Cardiovascular;  Laterality: N/A;   IR CT HEAD LTD  05/21/2022   IR PERCUTANEOUS ART THROMBECTOMY/INFUSION INTRACRANIAL INC DIAG ANGIO  05/21/2022   LEFT HEART CATH AND CORONARY ANGIOGRAPHY N/A 10/22/2021   Procedure: LEFT HEART CATH AND CORONARY ANGIOGRAPHY;  Surgeon: ArWellington HampshireMD;  Location: [facility];  Service: Cardiovascular;  Laterality: N/A;   LOOP  RECORDER INSERTION N/A 05/23/2022   Procedure: LOOP RECORDER INSERTION;  Surgeon: LaVickie EpleyMD;  Location: [facility];  Service: Cardiovascular;  Laterality: N/A;   RADIOLOGY WITH ANESTHESIA N/A 05/20/2022   Procedure: IR WITH ANESTHESIA;  Surgeon: DeLuanne BrasMD;  Location: [facility] Service: Radiology;  Laterality: N/A;   ROTATOR CUFF REPAIR     Patient Active Problem List   Diagnosis Date Noted   Stroke (cerebrum) (HCS[place]10/10/2021   Middle cerebral artery embolism, 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 male with PMH: HTN, CAD, HLD, osteoarthritis, SCC, sleep apnea. He presented to the hospital on 05/20/22 with right sided weakness and aphasia with LKW reported by wife to be 10pm on 10/2.  [facility] emergent CT/CTA which demonstrated left M1 occlusion. He received IV tenecteplase and IR thrombectomy due to large vessel occlusion.  DIAGNOSTIC FINDINGS:  MRI 05/22/2022 Cortical ischemia throughout the left MCA territory compatible with recent infarct. Relatively little affect on the white matter. 2. Small focus of acute hemorrhage along the anterior surface of the left temporal lobe Heidelberg classification 3c: Subarachnoid hemorrhage. 3. Normal intracranial MRA.  Restored flow in left MCA.    THERAPY DIAG:  Aphasia  Apraxia  Middle cerebral artery embolism, left  Rationale for Evaluation and Treatment Rehabilitation  SUBJECTIVE: "I have a question for you, he really struggles with the homework"  Pt accompanied by: self and  significant other  PAIN:  Are you having pain? No  PATIENT GOALS: to be able to communicate again  OBJECTIVE:   TODAY'S TREATMENT: Skilled treatment session focused on pt's communication goals. SLP facilitated session by providing the following interventions:    Pt is verbally perseverative during attempts to name or repeat information. His perseverations typically revolve around "strst" and "two two." Pt is largely unaware but able to identify errors when written for him to read. During one of these communication breakdowns, pt was able to write down the word he was trying to say. Pt's spelling was accurate and communicative. Extensive education provided to pt's wife to utilize writing as a form of communication. Pt also used his cell phone multiple times for accurate communication.       PATIENT EDUCATION: Education details: how to facilitate language production thru activities, auditory bombardment during spelling tasks Person educated: Patient and Parent Education method: Explanation, Demonstration, Verbal cues, and Handouts Education comprehension: verbalized understanding  GOALS: Goals reviewed with patient? Yes  SHORT TERM GOALS: Target date: 10 sessions  Given moderate assistance, pt will answer simple basic yes/no questions with 50% accuracy.  Baseline: 25% Goal status: INITIAL   2.  Given moderate assistance, pt will follow simple basic 1 step directions with 50% accuracy.  Baseline: 0% Goal status: INITIAL   3.  With maximal multimodal cues, pt will name set of 10 common objects in 8 out of 10 opportunities.  Baseline: unable Goal status: INITIAL   4. Pt will improve reading abilities by placing phrase level object function with object in field of 3 with 50% ability and moderate cues.              Baseline: object name to object - 90%             Goal status: INITIAL   5. Pt will improve spelling abilities by completing common words by filling in missing letter  with 50% ability and moderate cues.              Baseline: unable             Goal status: INITIAL   6. Pt will repeat rote phrases with 25% speech intelligibility given max to moderate cues.              Baseline: unable             Goal status: INITIAL   LONG TERM GOALS: Target date: 08/26/2021   Pt will use multimodal communication to express basic wants/needs in 4 of 10 opportunities.  Baseline: unable Goal status: INITIAL   2.  Pt will demonstrate increased reading comprehension by selecting accurate answer from 3 choices in 2 out of 10 opportunities.  Baseline: unable Goal status: INITIAL   3.  Pt will correctly spell his name in 5 out of 10 opportunities.  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 Pt is dependent on his wife to figure out what is trying to be said and she reports increased pt frustration. Recommend she give pt paper and pencil to see what pt is able to convey. 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   OBJECTIVE IMPAIRMENTS include expressive language, receptive language, aphasia, and apraxia. These impairments are limiting patient from managing medications, managing appointments, managing finances, household responsibilities, ADLs/IADLs, and effectively communicating at home and in community. Factors affecting potential to achieve goals and functional outcome are severity of impairments. Patient will benefit from skilled SLP services to address above impairments and improve overall function.  REHAB POTENTIAL: Excellent  PLAN: SLP FREQUENCY: 3x/week  SLP DURATION: 12 weeks  PLANNED INTERVENTIONS: Language facilitation, Environmental controls, Internal/external aids, Functional tasks, Multimodal communication approach, SLP instruction and feedback, Compensatory strategies,  and Patient/family education   Floye Fesler B. Rutherford Nail, M.S., CCC-SLP, Mining engineer Certified Brain Injury [place]  [place] Office [phone] Ascom [phone] Fax [phone]

## 2022-06-17 ENCOUNTER — Ambulatory Visit: Payer: Medicare HMO

## 2022-06-17 ENCOUNTER — Ambulatory Visit: Payer: Medicare HMO | Admitting: Speech Pathology

## 2022-06-17 DIAGNOSIS — R4701 Aphasia: Secondary | ICD-10-CM | POA: Diagnosis not present

## 2022-06-17 DIAGNOSIS — I6602 Occlusion and stenosis of left middle cerebral artery: Secondary | ICD-10-CM | POA: Diagnosis not present

## 2022-06-17 DIAGNOSIS — R262 Difficulty in walking, not elsewhere classified: Secondary | ICD-10-CM | POA: Diagnosis not present

## 2022-06-17 DIAGNOSIS — R482 Apraxia: Secondary | ICD-10-CM

## 2022-06-17 DIAGNOSIS — I639 Cerebral infarction, unspecified: Secondary | ICD-10-CM | POA: Diagnosis not present

## 2022-06-17 DIAGNOSIS — R278 Other lack of coordination: Secondary | ICD-10-CM | POA: Diagnosis not present

## 2022-06-17 DIAGNOSIS — R41841 Cognitive communication deficit: Secondary | ICD-10-CM | POA: Diagnosis not present

## 2022-06-17 DIAGNOSIS — M6281 Muscle weakness (generalized): Secondary | ICD-10-CM | POA: Diagnosis not present

## 2022-06-17 NOTE — Therapy (Signed)
OUTPATIENT SPEECH LANGUAGE PATHOLOGY TREATMENT NOTE   Patient Name: Alan Stewart MRN: [id number] DOB:[birth year], 76 y.o., male Today's Date: 06/17/2022  PCP: Ramonita Lab, MD REFERRING PROVIDER: Ramonita Lab, MD  END OF SESSION:   End of Session - 06/17/22 0837     Visit Number 6    Number of Visits 25    Date for SLP Re-Evaluation 08/26/22    Authorization Type [employer] HMO    Progress Note Due on Visit 10    SLP Start Time 1000    SLP Stop Time  1100    SLP Time Calculation (min) 60 min    Activity Tolerance Patient tolerated treatment well             Past Medical History:  Diagnosis Date   CAD (coronary artery disease)    a. 10/2021 NSTEMI/PCI: LM nl, LAD min irregs, LCX 66m(2.5x18 Onyx Frontier DES), OM1 nl, RCA 940m3.5x26 Onyx Frontier DES), 30d.   Diastolic dysfunction    a. 10/2021 Echo: EF 50-55%, no rwma, Gr1 DD, nl RV fxn. No significant valvular dzs.   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 SCCis. Tx pending   Past Surgical History:  Procedure Laterality Date   COLONOSCOPY N/A 10/09/2015   Procedure: COLONOSCOPY;  Surgeon: PaHulen LusterMD;  [facility]  Service: Gastroenterology;  Laterality: N/A;   CORONARY STENT INTERVENTION N/A 10/22/2021   Procedure: CORONARY STENT INTERVENTION;  Surgeon: ArWellington HampshireMD;  [place] [facility];  Service: Cardiovascular;  Laterality: N/A;   IR CT HEAD LTD  05/21/2022   IR PERCUTANEOUS ART THROMBECTOMY/INFUSION INTRACRANIAL INC DIAG ANGIO  05/21/2022   LEFT HEART CATH AND CORONARY ANGIOGRAPHY N/A 10/22/2021   Procedure: LEFT HEART CATH AND CORONARY ANGIOGRAPHY;  Surgeon: ArWellington HampshireMD;  [place] [place];  Service: Cardiovascular;  Laterality: N/A;   LOOP  RECORDER INSERTION N/A 05/23/2022   Procedure: LOOP RECORDER INSERTION;  Surgeon: LaVickie EpleyMD;  [place];  Service: Cardiovascular;  Laterality: N/A;   RADIOLOGY WITH ANESTHESIA N/A 05/20/2022   Procedure: IR WITH ANESTHESIA;  Surgeon: DeLuanne BrasMD;  Location: [place] Service: Radiology;  Laterality: N/A;   ROTATOR CUFF REPAIR     Patient Active Problem List   Diagnosis Date Noted   Stroke (cerebrum) (HCWoodville10/10/2021   Middle cerebral artery embolism, 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 male with PMH: HTN, CAD, HLD, osteoarthritis, SCC, sleep apnea. He presented to the hospital on 05/20/22 with right sided weakness and aphasia with LKW reported by wife to be 10pm on 10/2.  [facility]/CTA which demonstrated left M1 occlusion. He received IV tenecteplase and IR thrombectomy due to large vessel occlusion.  DIAGNOSTIC FINDINGS:  MRI 05/22/2022 Cortical ischemia throughout the left MCA territory compatible with recent infarct. Relatively little affect on the white matter. 2. Small focus of acute hemorrhage along the anterior surface of the left temporal lobe Heidelberg classification 3c: Subarachnoid hemorrhage. 3. Normal intracranial MRA.  Restored flow in left MCA.    THERAPY DIAG:  Aphasia  Apraxia  Middle cerebral artery embolism, left  Rationale for Evaluation and Treatment Rehabilitation  SUBJECTIVE: "I have a question for you, he really struggles with the homework"  Pt accompanied by: self and  significant other  PAIN:  Are you having pain? No  PATIENT GOALS: to be able to communicate again  OBJECTIVE:   TODAY'S TREATMENT: Skilled treatment session focused on pt's communication goals. SLP facilitated session by providing the following interventions:    Pt with improved self-monitoring for perseverative speech. Using multimodal approach, visual picture of pt, his wife and their dogs, pt able to fill in missing letters within their names, progressing to writing each name without support and expressing each name including bi-syllable names.       PATIENT EDUCATION: Education details: ways to use spelling as pt's strength for communication Person educated: Patient and Parent Education method: Explanation, Demonstration, Verbal cues, and Handouts Education comprehension: verbalized understanding  GOALS: Goals reviewed with patient? Yes  SHORT TERM GOALS: Target date: 10 sessions  Given moderate assistance, pt will answer simple basic yes/no questions with 50% accuracy.  Baseline: 25% Goal status: INITIAL   2.  Given moderate assistance, pt will follow simple basic 1 step directions with 50% accuracy.  Baseline: 0% Goal status: INITIAL   3.  With maximal multimodal cues, pt will name set of 10 common objects in 8 out of 10 opportunities.  Baseline: unable Goal status: INITIAL   4. Pt will improve reading abilities by placing phrase level object function with object in field of 3 with 50% ability and moderate cues.              Baseline: object name to object - 90%             Goal status: INITIAL   5. Pt will improve spelling abilities by completing common words by filling in missing letter with 50% ability and moderate cues.              Baseline: unable             Goal status: INITIAL   6. Pt will repeat rote phrases with 25% speech intelligibility given max to moderate cues.              Baseline: unable             Goal status: INITIAL   LONG TERM GOALS:  Target date: 08/26/2021   Pt will use multimodal communication to express basic wants/needs in 4 of 10 opportunities.  Baseline: unable Goal status: INITIAL   2.  Pt will demonstrate increased reading comprehension by selecting accurate answer from 3 choices in 2 out of 10 opportunities.  Baseline: unable Goal status: INITIAL   3.  Pt will correctly spell his name in 5 out of 10 opportunities.  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 Pt continues to eager to make progress with great progress in self-monitoring.   OBJECTIVE IMPAIRMENTS include expressive language, receptive language, aphasia, and apraxia.  These impairments are limiting patient from managing medications, managing appointments, managing finances, household responsibilities, ADLs/IADLs, and effectively communicating at home and in community. Factors affecting potential to achieve goals and functional outcome are severity of impairments. Patient will benefit from skilled SLP services to address above impairments and improve overall function.  REHAB POTENTIAL: Excellent  PLAN: SLP FREQUENCY: 3x/week  SLP DURATION: 12 weeks  PLANNED INTERVENTIONS: Language facilitation, Environmental controls, Internal/external aids, Functional tasks, Multimodal communication approach, SLP instruction and feedback, Compensatory strategies, and Patient/family education   Saavi Mceachron B. Rutherford Nail, M.S., CCC-SLP, Mining engineer Certified Brain Injury [place]  [place] Office [phone] Ascom [phone] Fax [phone]

## 2022-06-18 ENCOUNTER — Ambulatory Visit: Payer: Medicare HMO | Admitting: Speech Pathology

## 2022-06-18 DIAGNOSIS — R262 Difficulty in walking, not elsewhere classified: Secondary | ICD-10-CM | POA: Diagnosis not present

## 2022-06-18 DIAGNOSIS — I6602 Occlusion and stenosis of left middle cerebral artery: Secondary | ICD-10-CM

## 2022-06-18 DIAGNOSIS — M6281 Muscle weakness (generalized): Secondary | ICD-10-CM | POA: Diagnosis not present

## 2022-06-18 DIAGNOSIS — R41841 Cognitive communication deficit: Secondary | ICD-10-CM | POA: Diagnosis not present

## 2022-06-18 DIAGNOSIS — I639 Cerebral infarction, unspecified: Secondary | ICD-10-CM | POA: Diagnosis not present

## 2022-06-18 DIAGNOSIS — R278 Other lack of coordination: Secondary | ICD-10-CM | POA: Diagnosis not present

## 2022-06-18 DIAGNOSIS — R4701 Aphasia: Secondary | ICD-10-CM

## 2022-06-18 DIAGNOSIS — R482 Apraxia: Secondary | ICD-10-CM | POA: Diagnosis not present

## 2022-06-18 NOTE — Therapy (Signed)
OUTPATIENT SPEECH LANGUAGE PATHOLOGY TREATMENT NOTE   Patient Name: Alan Stewart MRN: [id number] DOB:Jul 30, 1946, 76 y.o., male Today's Date: 06/18/2022  PCP: Ramonita Lab, MD REFERRING PROVIDER: Ramonita Lab, MD  END OF SESSION:   End of Session - 06/18/22 1722     Visit Number 7    Number of Visits 25    Date for SLP Re-Evaluation 08/26/22    Authorization Type [employer] H[employer]    Progress Note Due on Visit 10    SLP Start Time 1100    SLP Stop Time  1200    SLP Time Calculation (min) 60 min    Activity Tolerance Patient tolerated treatment well             Past Medical History:  Diagnosis Date   CAD (coronary artery disease)    a. 10/2021 NSTEMI/PCI: LM nl, LAD min irregs, LCX 39m(2.5x18 Onyx Frontier DES), OM1 nl, RCA 939m3.5x26 Onyx Frontier DES), 30d.   Diastolic dysfunction    a. 10/2021 Echo: EF 50-55%, no rwma, Gr1 DD, nl RV fxn. No significant valvular dzs.   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 SCCis. Tx pending   Past Surgical History:  Procedure Laterality Date   COLONOSCOPY N/A 10/09/2015   Procedure: COLONOSCOPY;  Surgeon: PaHulen LusterMD;  [facility];  Service: Gastroenterology;  Laterality: N/A;   CORONARY STENT INTERVENTION N/A 10/22/2021   Procedure: CORONARY STENT INTERVENTION;  Surgeon: ArWellington HampshireMD;  [place];  Service: Cardiovascular;  Laterality: N/A;   IR CT HEAD LTD  05/21/2022   IR PERCUTANEOUS ART THROMBECTOMY/INFUSION INTRACRANIAL INC DIAG ANGIO  05/21/2022   LEFT HEART CATH AND CORONARY ANGIOGRAPHY N/A 10/22/2021   Procedure: LEFT HEART CATH AND CORONARY ANGIOGRAPHY;  Surgeon: ArWellington HampshireMD;  [place];  Service: Cardiovascular;  Laterality: N/A;   LOOP  RECORDER INSERTION N/A 05/23/2022   Procedure: LOOP RECORDER INSERTION;  Surgeon: LaVickie EpleyMD;  [place];  Service: Cardiovascular;  Laterality: N/A;   RADIOLOGY WITH ANESTHESIA N/A 05/20/2022   Procedure: IR WITH ANESTHESIA;  Surgeon: DeLuanne BrasMD;  [place] [facility] Service: Radiology;  Laterality: N/A;   ROTATOR CUFF REPAIR     Patient Active Problem List   Diagnosis Date Noted   Stroke (cerebrum) (HC[facility]10/10/2021   Middle cerebral artery embolism, 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 male with PMH: HTN, CAD, HLD, osteoarthritis, SCC, sleep apnea. He presented to the hospital on 05/20/22 with right sided weakness and aphasia with LKW reported by wife to be 10pm on 10/2.  [facility] emergent CT/CTA which demonstrated left M1 occlusion. He received IV tenecteplase and IR thrombectomy due to large vessel occlusion.  DIAGNOSTIC FINDINGS:  MRI 05/22/2022 Cortical ischemia throughout the left MCA territory compatible with recent infarct. Relatively little affect on the white matter. 2. Small focus of acute hemorrhage along the anterior surface of the left temporal lobe Heidelberg classification 3c: Subarachnoid hemorrhage. 3. Normal intracranial MRA.  Restored flow in left MCA.    THERAPY DIAG:  Aphasia  Apraxia  Middle cerebral artery embolism, left  Rationale for Evaluation and Treatment Rehabilitation  SUBJECTIVE: "I have a question for you, he really struggles with the homework"  Pt accompanied by: self and  significant other  PAIN:  Are you having pain? No  PATIENT GOALS: to be able to communicate again  OBJECTIVE:   TODAY'S TREATMENT: Skilled treatment session focused on pt's communication goals. SLP facilitated session by providing the following interventions:    Pt with improved self-monitoring for perseverative speech. Using multimodal approach, visual picture of pt, his wife and their dogs, pt able to fill in missing letters within their names, progressing to writing each name without support and expressing each name including bi-syllable names.       PATIENT EDUCATION: Education details: ways to use spelling as pt's strength for communication Person educated: Patient and Parent Education method: Explanation, Demonstration, Verbal cues, and Handouts Education comprehension: verbalized understanding  GOALS: Goals reviewed with patient? Yes  SHORT TERM GOALS: Target date: 10 sessions  Given moderate assistance, pt will answer simple basic yes/no questions with 50% accuracy.  Baseline: 25% Goal status: INITIAL   2.  Given moderate assistance, pt will follow simple basic 1 step directions with 50% accuracy.  Baseline: 0% Goal status: INITIAL   3.  With maximal multimodal cues, pt will name set of 10 common objects in 8 out of 10 opportunities.  Baseline: unable Goal status: INITIAL   4. Pt will improve reading abilities by placing phrase level object function with object in field of 3 with 50% ability and moderate cues.              Baseline: object name to object - 90%             Goal status: INITIAL   5. Pt will improve spelling abilities by completing common words by filling in missing letter with 50% ability and moderate cues.              Baseline: unable             Goal status: INITIAL   6. Pt will repeat rote phrases with 25% speech intelligibility given max to moderate cues.              Baseline: unable             Goal status: INITIAL   LONG TERM GOALS:  Target date: 08/26/2021   Pt will use multimodal communication to express basic wants/needs in 4 of 10 opportunities.  Baseline: unable Goal status: INITIAL   2.  Pt will demonstrate increased reading comprehension by selecting accurate answer from 3 choices in 2 out of 10 opportunities.  Baseline: unable Goal status: INITIAL   3.  Pt will correctly spell his name in 5 out of 10 opportunities.  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 Pt continues to eager to make progress with great progress in self-monitoring.   OBJECTIVE IMPAIRMENTS include expressive language, receptive language, aphasia, and apraxia.  These impairments are limiting patient from managing medications, managing appointments, managing finances, household responsibilities, ADLs/IADLs, and effectively communicating at home and in community. Factors affecting potential to achieve goals and functional outcome are severity of impairments. Patient will benefit from skilled SLP services to address above impairments and improve overall function.  REHAB POTENTIAL: Excellent  PLAN: SLP FREQUENCY: 3x/week  SLP DURATION: 12 weeks  PLANNED INTERVENTIONS: Language facilitation, Environmental controls, Internal/external aids, Functional tasks, Multimodal communication approach, SLP instruction and feedback, Compensatory strategies, and Patient/family education   Jarita Raval B. Rutherford Nail, M.S., CCC-SLP, Mining engineer Certified Brain Injury [place]  [place] Office [phone] Ascom [phone] Fax [phone]

## 2022-06-18 NOTE — Therapy (Signed)
OUTPATIENT SPEECH LANGUAGE PATHOLOGY TREATMENT NOTE   Patient Name: Alan Stewart MRN: [id number] DOB:Sep 20, 1945, 76 y.o., male Today's Date: 06/18/2022  PCP: Ramonita Lab, MD REFERRING PROVIDER: Ramonita Lab, MD  END OF SESSION:   End of Session - 06/18/22 1726     Visit Number 8    Number of Visits 25    Date for SLP Re-Evaluation 08/26/22    Authorization Type [employer] HMO    Progress Note Due on Visit 10    SLP Start Time 1400    SLP Stop Time  1500    SLP Time Calculation (min) 60 min    Activity Tolerance Patient tolerated treatment well             Past Medical History:  Diagnosis Date   CAD (coronary artery disease)    a. 10/2021 NSTEMI/PCI: LM nl, LAD min irregs, LCX 53m(2.5x18 Onyx Frontier DES), OM1 nl, RCA 975m3.5x26 Onyx Frontier DES), 30d.   Diastolic dysfunction    a. 10/2021 Echo: EF 50-55%, no rwma, Gr1 DD, nl RV fxn. No significant valvular dzs.   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 SCCis. Tx pending   Past Surgical History:  Procedure Laterality Date   COLONOSCOPY N/A 10/09/2015   Procedure: COLONOSCOPY;  Surgeon: PaHulen LusterMD;  [facility]  Service: Gastroenterology;  Laterality: N/A;   CORONARY STENT INTERVENTION N/A 10/22/2021   Procedure: CORONARY STENT INTERVENTION;  Surgeon: ArWellington HampshireMD;  [place] [facility];  Service: Cardiovascular;  Laterality: N/A;   IR CT HEAD LTD  05/21/2022   IR PERCUTANEOUS ART THROMBECTOMY/INFUSION INTRACRANIAL INC DIAG ANGIO  05/21/2022   LEFT HEART CATH AND CORONARY ANGIOGRAPHY N/A 10/22/2021   Procedure: LEFT HEART CATH AND CORONARY ANGIOGRAPHY;  Surgeon: ArWellington HampshireMD;  [place] [facility];  Service: Cardiovascular;  Laterality: N/A;   LOOP  RECORDER INSERTION N/A 05/23/2022   Procedure: LOOP RECORDER INSERTION;  Surgeon: LaVickie EpleyMD;  [place] [facility];  Service: Cardiovascular;  Laterality: N/A;   RADIOLOGY WITH ANESTHESIA N/A 05/20/2022   Procedure: IR WITH ANESTHESIA;  Surgeon: DeLuanne BrasMD;  [place] Service: Radiology;  Laterality: N/A;   ROTATOR CUFF REPAIR     Patient Active Problem List   Diagnosis Date Noted   Stroke (cerebrum) (HCPat[place]10/10/2021   Middle cerebral artery embolism, 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 male with PMH: HTN, CAD, HLD, osteoarthritis, SCC, sleep apnea. He presented to the hospital on 05/20/22 with right sided weakness and aphasia with LKW reported by wife to be 10pm on 10/2.  [facility] CT/CTA which demonstrated left M1 occlusion. He received IV tenecteplase and IR thrombectomy due to large vessel occlusion.  DIAGNOSTIC FINDINGS:  MRI 05/22/2022 Cortical ischemia throughout the left MCA territory compatible with recent infarct. Relatively little affect on the white matter. 2. Small focus of acute hemorrhage along the anterior surface of the left temporal lobe Heidelberg classification 3c: Subarachnoid hemorrhage. 3. Normal intracranial MRA.  Restored flow in left MCA.    THERAPY DIAG:  Aphasia  Apraxia  Middle cerebral artery embolism, left  Rationale for Evaluation and Treatment Rehabilitation  SUBJECTIVE: "I have a question for you, he really struggles with the homework"  Pt accompanied by: self and  significant other  PAIN:  Are you having pain? No  PATIENT GOALS: to be able to communicate again  OBJECTIVE:   TODAY'S TREATMENT: Skilled treatment session focused on pt's communication goals. SLP facilitated session by providing the following interventions:    Pt with improved self-monitoring for perseverative speech. Using multimodal approach, visual picture of pt, his wife and their dogs, pt able to fill in missing letters within their names, progressing to writing each name without support and expressing each name including bi-syllable names.       PATIENT EDUCATION: Education details: ways to use spelling as pt's strength for communication Person educated: Patient and Parent Education method: Explanation, Demonstration, Verbal cues, and Handouts Education comprehension: verbalized understanding  GOALS: Goals reviewed with patient? Yes  SHORT TERM GOALS: Target date: 10 sessions  Given moderate assistance, pt will answer simple basic yes/no questions with 50% accuracy.  Baseline: 25% Goal status: INITIAL   2.  Given moderate assistance, pt will follow simple basic 1 step directions with 50% accuracy.  Baseline: 0% Goal status: INITIAL   3.  With maximal multimodal cues, pt will name set of 10 common objects in 8 out of 10 opportunities.  Baseline: unable Goal status: INITIAL   4. Pt will improve reading abilities by placing phrase level object function with object in field of 3 with 50% ability and moderate cues.              Baseline: object name to object - 90%             Goal status: INITIAL   5. Pt will improve spelling abilities by completing common words by filling in missing letter with 50% ability and moderate cues.              Baseline: unable             Goal status: INITIAL   6. Pt will repeat rote phrases with 25% speech intelligibility given max to moderate cues.              Baseline: unable             Goal status: INITIAL   LONG TERM GOALS:  Target date: 08/26/2021   Pt will use multimodal communication to express basic wants/needs in 4 of 10 opportunities.  Baseline: unable Goal status: INITIAL   2.  Pt will demonstrate increased reading comprehension by selecting accurate answer from 3 choices in 2 out of 10 opportunities.  Baseline: unable Goal status: INITIAL   3.  Pt will correctly spell his name in 5 out of 10 opportunities.  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 Pt continues to eager to make progress with great progress in self-monitoring.   OBJECTIVE IMPAIRMENTS include expressive language, receptive language, aphasia, and apraxia.  These impairments are limiting patient from managing medications, managing appointments, managing finances, household responsibilities, ADLs/IADLs, and effectively communicating at home and in community. Factors affecting potential to achieve goals and functional outcome are severity of impairments. Patient will benefit from skilled SLP services to address above impairments and improve overall function.  REHAB POTENTIAL: Excellent  PLAN: SLP FREQUENCY: 3x/week  SLP DURATION: 12 weeks  PLANNED INTERVENTIONS: Language facilitation, Environmental controls, Internal/external aids, Functional tasks, Multimodal communication approach, SLP instruction and feedback, Compensatory strategies, and Patient/family education   Danity Schmelzer B. Rutherford Nail, M.S., CCC-SLP, Mining engineer Certified Brain Injury [place]  [place] Office [phone] Ascom [phone] Fax [phone]

## 2022-06-19 ENCOUNTER — Encounter: Payer: Self-pay | Admitting: Adult Health

## 2022-06-19 ENCOUNTER — Inpatient Hospital Stay: Payer: Medicare HMO | Admitting: Adult Health

## 2022-06-19 ENCOUNTER — Telehealth: Payer: Self-pay

## 2022-06-19 ENCOUNTER — Ambulatory Visit: Payer: Medicare HMO | Admitting: Adult Health

## 2022-06-19 VITALS — BP 128/63 | HR 52 | Ht 68.0 in | Wt 264.8 lb

## 2022-06-19 DIAGNOSIS — E782 Mixed hyperlipidemia: Secondary | ICD-10-CM

## 2022-06-19 DIAGNOSIS — I63512 Cerebral infarction due to unspecified occlusion or stenosis of left middle cerebral artery: Secondary | ICD-10-CM | POA: Diagnosis not present

## 2022-06-19 NOTE — Telephone Encounter (Signed)
-----   Message from [place], MD sent at 06/10/2022  2:40 PM EDT ----- Skin , right 2nd finger proximal SQUAMOUS CELL CARCINOMA IN SITU --> ED&C within next 2-3 weeks vrs Mohs  ED&C has about an 85% cure rate and leaves a round wound the size of the skin cancer which is healed with ointment and a bandage over a few weeks time. It leaves a round white scar. No additional pathology is done. If the skin cancer were to come back, we would need to do a surgery to remove it.   Mohs involves cutting out right around the spot and then checking under the microscope to be sure the whole skin cancer is out. The cure rate is about 98-99%. It is done at another office outside of [place] ([place], [place], or [place]). Once the Mohs surgeon confirms the skin cancer is out, they will discuss the options to repair or heal the area. You must take it easy for about two weeks after surgery (no lifting over 10-15 lbs, avoid activity to get your heart rate and blood pressure up).    MAs please call with results and schedule/refer. Please let me know if they have questions for me. Thank you!

## 2022-06-19 NOTE — Progress Notes (Signed)
PATIENT: Alan Stewart DOB: Dec 11, 1945  REASON FOR VISIT: follow up HISTORY FROM: patient PRIMARY NEUROLOGIST: Dr. Leonie Man  Chief Complaint  Patient presents with   Follow-up    Pt in 56 with wife  Pt wife states patient is getting Wife states pt has a hard time expressing his words Wife states pt takes Speech Therapy 3x a week .  Wife states patient had Heart attack in march and CVA in October      HISTORY OF PRESENT ILLNESS: Today 06/19/22  Alan Stewart is a 76 y.o. 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 Comes in today for hospital FU.  He is here today with his wife.  Reports that he is having trouble with expressive aphasia.  He is in speech therapy 3 times a week. Denies any residual weakness.  Able to complete ADLs independently.  A loop recorder was placed in the hospital. Had sleep study through cardiology and he does have OSA and waiting to be treated.  Returns today for evaluation.   HISTORY Alan Stewart is a 76 y.o. male with history of hypertension, hyperlipidemia, CAD status post stent, OSA, obesity admitted for right-sided weakness and aphasia.  TNK given.  Status post IR for left terminal ICA and A1 and M1 occlusion. Loop recorder to be placed prior to discharge.     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 Per Dr. Estanislado Pandy, clot felt to be cardioembolic source instead of ICAD MRI cortical ischemia throughout left MCA territory compatible with recent infarction. Small focus of acute hemorrhage along the anterior surface of the left temporal lobe Heidelberg classification  3c: Subarachnoid hemorrhage. MRA normal with restored flow in the left MCA 2D Echo EF 60 to 65% LE venous Doppler no DVT Loop recorder prior to discharge to rule out A-fib LDL 56 HgbA1c 6.0 UDS negative SCDs VTE prophylaxis aspirin 81 mg daily and Brilinta (ticagrelor) 90 mg bid prior to admission, on ASA and brilinta home meds.  Ongoing aggressive stroke risk factor management Therapy recommendations: outpatient PT, OT, ST Disposition: Pending  REVIEW OF SYSTEMS: Out of a complete 14 system review of symptoms, the patient complains only of the following symptoms, and all other reviewed systems are negative.  ALLERGIES: No Known Allergies  HOME MEDICATIONS: Outpatient Medications Prior to Visit  Medication Sig Dispense Refill   aspirin EC 81 MG EC tablet Take 1 tablet (81 mg total) by mouth daily. Swallow whole. 30 tablet 11   atorvastatin (LIPITOR) 80 MG tablet Take 1 tablet (80 mg total) by mouth daily. 90 tablet 3   carvedilol (COREG) 6.25 MG tablet Take 1 tablet (6.25 mg total) by mouth 2 (two) times daily with a meal. 180 tablet 1   ezetimibe (ZETIA) 10 MG tablet Take 1 tablet (10 mg total) by mouth daily. 90 tablet 3   levothyroxine (SYNTHROID) 75 MCG tablet Take 75 mcg by mouth daily before breakfast.     nitroGLYCERIN (NITROSTAT) 0.4 MG SL tablet Place 1 tablet (0.4 mg total) under the tongue every 5 (five) minutes as needed for chest pain.  30 tablet 12   ticagrelor (BRILINTA) 90 MG TABS tablet Take 1 tablet (90 mg total) by mouth 2 (two) times daily. 180 tablet 1   No facility-administered medications prior to visit.    PAST MEDICAL HISTORY: Past Medical History:  Diagnosis Date   CAD (coronary artery disease)    a. 10/2021 NSTEMI/PCI: LM nl, LAD min irregs, LCX 24m(2.5x18 Onyx Frontier DES), OM1 nl, RCA 920m3.5x26 Onyx Frontier DES), 30d.   Diastolic dysfunction    a. 10/2021 Echo: EF 50-55%, no rwma, Gr1 DD, nl RV fxn. No significant valvular dzs.   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 SCCis. Tx pending    PAST SURGICAL HISTORY: Past Surgical History:  Procedure Laterality Date   COLONOSCOPY N/A 10/09/2015   Procedure: COLONOSCOPY;  Surgeon: PaHulen LusterMD;  [facility];  Service: Gastroenterology;  Laterality: N/A;   CORONARY STENT INTERVENTION N/A 10/22/2021   Procedure: CORONARY STENT INTERVENTION;  Surgeon: ArWellington HampshireMD;  [facility];  Service: Cardiovascular;  Laterality: N/A;   IR CT HEAD LTD  05/21/2022   IR PERCUTANEOUS ART THROMBECTOMY/INFUSION INTRACRANIAL INC DIAG ANGIO  05/21/2022   LEFT HEART CATH AND CORONARY ANGIOGRAPHY N/A 10/22/2021   Procedure: LEFT HEART CATH AND CORONARY ANGIOGRAPHY;  Surgeon: ArWellington HampshireMD;  [facility];  Service: Cardiovascular;  Laterality: N/A;   LOOP RECORDER INSERTION N/A 05/23/2022   Procedure: LOOP RECORDER INSERTION;  Surgeon: LaVickie EpleyMD;  [facility];  Service: Cardiovascular;  Laterality: N/A;   RADIOLOGY WITH ANESTHESIA N/A 05/20/2022   Procedure: IR WITH ANESTHESIA;  Surgeon: DeLuanne BrasMD;  Location: [facility] Service: Radiology;  Laterality: N/A;   ROTATOR CUFF REPAIR      FAMILY HISTORY: No family history on file.  SOCIAL HISTORY: Social History   Socioeconomic History   Marital status: Married    Spouse name: Not on file   Number of children: Not on file   Years of education: Not on file   Highest education level: Not on file  Occupational History   Not on file  Tobacco Use   Smoking status: Former    Packs/day: 1.50    Years: 10.00    Total pack years: 15.00    Types: Cigarettes    Quit date: 07/19/1990    Years since quitting: 31.9   Smokeless tobacco: Former  [employer] trainerse: Never used  Substance and Sexual Activity   Alcohol use: Yes   Drug use: No   Sexual activity: Not on file  Other Topics Concern   Not on file  Social History Narrative   Not on file   Social Determinants of Health   Financial Resource Strain: Not on file  Food Insecurity: Not on file  Transportation Needs: Not on file  Physical Activity: Not on file  Stress: Not on file  Social Connections: Not on file  Intimate Partner Violence: Not on file      PHYSICAL EXAM  Vitals:   06/19/22 0957  BP: 128/63  Pulse: (!) 52  Weight: 264 lb 12.8 oz (120.1 kg)  Height: '5\' 8"'$  (1.727 m)  Body mass index is 40.26 kg/m.  Generalized: Well developed, in no acute distress   Neurological examination  Mentation: Alert oriented to time, place, history taking. Follows all commands.  Expressive aphasia Cranial nerve II-XII: Pupils were equal round reactive to light. Extraocular movements were full, visual field were full on confrontational test. Facial sensation and strength were normal.  Head turning and shoulder shrug  were normal and symmetric. Motor: The motor testing reveals 5 over 5 strength of all 4 extremities. Good symmetric motor tone is noted throughout.  Sensory: Sensory testing is intact to soft touch on all 4 extremities. No evidence of extinction is noted.  Coordination: Cerebellar testing reveals good finger-nose-finger and heel-to-shin bilaterally.  Gait and station: Gait is normal.    DIAGNOSTIC DATA (LABS, IMAGING, TESTING) - I reviewed patient records, labs, notes, testing and imaging myself where available.  Lab Results  Component Value Date   WBC 7.2 05/21/2022   HGB 13.4 05/21/2022   HCT 39.8 05/21/2022   MCV 90.0 05/21/2022   PLT 207 05/21/2022      Component Value Date/Time   NA 138 05/21/2022 0433   K 4.0 05/21/2022 0433   CL 110 05/21/2022 0433   CO2 22 05/21/2022 0433   GLUCOSE 195 (H) 05/21/2022 0433   BUN 15 05/21/2022 0433    CREATININE 1.08 05/21/2022 0433   CALCIUM 8.3 (L) 05/21/2022 0433   PROT 6.1 (L) 05/20/2022 2315   ALBUMIN 3.3 (L) 05/20/2022 2315   AST 29 05/20/2022 2315   ALT 30 05/20/2022 2315   ALKPHOS 105 05/20/2022 2315   BILITOT 0.6 05/20/2022 2315   GFRNONAA >60 05/21/2022 0433   GFRAA >60 05/29/2017 1016   Lab Results  Component Value Date   CHOL 106 05/21/2022   HDL 33 (L) 05/21/2022   LDLCALC 56 05/21/2022   TRIG 83 05/21/2022   CHOLHDL 3.2 05/21/2022   Lab Results  Component Value Date   HGBA1C 6.0 (H) 05/21/2022   No results found for: "VITAMINB12" Lab Results  Component Value Date   TSH 2.044 10/20/2021      ASSESSMENT AND PLAN 76 y.o. year old male  has a past medical history of CAD (coronary artery disease), Diastolic dysfunction, Hyperglycemia, Hyperlipidemia, Hypertension, Morbid obesity ([place]), Osteoarthritis, SCC (squamous cell carcinoma) (05/16/2022), Sleep apnea, and Squamous cell carcinoma in situ of skin (06/04/2022). 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  Discussed secondary stroke prevention measures and importance of close PCP follow up for aggressive stroke risk factor management. I have gone over the pathophysiology of stroke, warning signs and symptoms, risk factors and their management in some detail with instructions to go to the closest emergency room for symptoms of concern. HTN: BP goal <130/90.   HLD: LDL goal <70. Recent LDL 56. On lipitor and Zetia DMII: A1c goal<7.0. Recent A1c 6.0.  Encouraged patient to monitor diet and encouraged exercise FU with our office in 6 months or sooner if needed    Ward Givens, MSN, NP-C 06/19/2022, 9:29 AM [facility] [address], [place], [place] [address] [phone]

## 2022-06-19 NOTE — Telephone Encounter (Signed)
LVM for patient's wife, Bonnita Nasuti, to call office for pathology results. Lurlean Horns., RMA

## 2022-06-20 ENCOUNTER — Telehealth: Payer: Self-pay

## 2022-06-20 ENCOUNTER — Ambulatory Visit: Payer: Medicare HMO | Attending: Internal Medicine | Admitting: Speech Pathology

## 2022-06-20 DIAGNOSIS — I6602 Occlusion and stenosis of left middle cerebral artery: Secondary | ICD-10-CM | POA: Diagnosis not present

## 2022-06-20 DIAGNOSIS — R4701 Aphasia: Secondary | ICD-10-CM | POA: Diagnosis not present

## 2022-06-20 DIAGNOSIS — D0461 Carcinoma in situ of skin of right upper limb, including shoulder: Secondary | ICD-10-CM

## 2022-06-20 DIAGNOSIS — R482 Apraxia: Secondary | ICD-10-CM | POA: Diagnosis not present

## 2022-06-20 NOTE — Telephone Encounter (Signed)
-----   Message from [place], MD sent at 06/10/2022  2:40 PM EDT ----- Skin , right 2nd finger proximal SQUAMOUS CELL CARCINOMA IN SITU --> ED&C within next 2-3 weeks vrs Mohs  ED&C has about an 85% cure rate and leaves a round wound the size of the skin cancer which is healed with ointment and a bandage over a few weeks time. It leaves a round Alan Stewart scar. No additional pathology is done. If the skin cancer were to come back, we would need to do a surgery to remove it.   Mohs involves cutting out right around the spot and then checking under the microscope to be sure the whole skin cancer is out. The cure rate is about 98-99%. It is done at another office outside of [place] ([place], [place], or [place]). Once the Mohs surgeon confirms the skin cancer is out, they will discuss the options to repair or heal the area. You must take it easy for about two weeks after surgery (no lifting over 10-15 lbs, avoid activity to get your heart rate and blood pressure up).    MAs please call with results and schedule/refer. Please let me know if they have questions for me. Thank you!

## 2022-06-20 NOTE — Telephone Encounter (Signed)
Patient's spouse returned phone call last night on nurse VM. Returned call this morning and left message to call office. aw

## 2022-06-20 NOTE — Telephone Encounter (Signed)
-----   Message from [place], MD sent at 06/10/2022  2:40 PM EDT ----- Skin , right 2nd finger proximal SQUAMOUS CELL CARCINOMA IN SITU --> ED&C within next 2-3 weeks vrs Mohs  ED&C has about an 85% cure rate and leaves a round wound the size of the skin cancer which is healed with ointment and a bandage over a few weeks time. It leaves a round Lindsay Straka scar. No additional pathology is done. If the skin cancer were to come back, we would need to do a surgery to remove it.   Mohs involves cutting out right around the spot and then checking under the microscope to be sure the whole skin cancer is out. The cure rate is about 98-99%. It is done at another office outside of [place] ([place], [place], or [place]). Once the Mohs surgeon confirms the skin cancer is out, they will discuss the options to repair or heal the area. You must take it easy for about two weeks after surgery (no lifting over 10-15 lbs, avoid activity to get your heart rate and blood pressure up).    MAs please call with results and schedule/refer. Please let me know if they have questions for me. Thank you!

## 2022-06-20 NOTE — Telephone Encounter (Signed)
06/19/22 - Spouse advised but would like to talk to PCP and other providers before making tx decision due to health conditions. aw

## 2022-06-21 ENCOUNTER — Encounter: Payer: Medicare HMO | Admitting: Speech Pathology

## 2022-06-21 ENCOUNTER — Encounter: Payer: Medicare HMO | Admitting: Occupational Therapy

## 2022-06-24 ENCOUNTER — Ambulatory Visit: Payer: Medicare HMO | Admitting: Speech Pathology

## 2022-06-24 ENCOUNTER — Ambulatory Visit: Payer: Medicare HMO

## 2022-06-24 ENCOUNTER — Encounter: Payer: Medicare HMO | Admitting: Occupational Therapy

## 2022-06-24 DIAGNOSIS — R482 Apraxia: Secondary | ICD-10-CM | POA: Diagnosis not present

## 2022-06-24 DIAGNOSIS — I6602 Occlusion and stenosis of left middle cerebral artery: Secondary | ICD-10-CM | POA: Diagnosis not present

## 2022-06-24 DIAGNOSIS — R4701 Aphasia: Secondary | ICD-10-CM

## 2022-06-24 NOTE — Therapy (Signed)
OUTPATIENT SPEECH LANGUAGE PATHOLOGY TREATMENT NOTE   Patient Name: Alan Stewart MRN: [id number] DOB:12-12-1945, 76 y.o., male Today's Date: 06/20/2022  PCP: Ramonita Lab, MD REFERRING PROVIDER: Ramonita Lab, MD  END OF SESSION:   End of Session - 06/20/2022 1726     Visit Number 9   Number of Visits 25    Date for SLP Re-Evaluation 08/26/22    Authorization Type [employer] HMO    Progress Note Due on Visit 10    SLP Start Time 1400    SLP Stop Time  1500    SLP Time Calculation (min) 60 min    Activity Tolerance Patient tolerated treatment well             Past Medical History:  Diagnosis Date   CAD (coronary artery disease)    a. 10/2021 NSTEMI/PCI: LM nl, LAD min irregs, LCX 18m(2.5x18 Onyx Frontier DES), OM1 nl, RCA 930m3.5x26 Onyx Frontier DES), 30d.   Diastolic dysfunction    a. 10/2021 Echo: EF 50-55%, no rwma, Gr1 DD, nl RV fxn. No significant valvular dzs.   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 SCCis. Tx pending   Past Surgical History:  Procedure Laterality Date   COLONOSCOPY N/A 10/09/2015   Procedure: COLONOSCOPY;  Surgeon: PaHulen LusterMD;  [facility];  Service: Gastroenterology;  Laterality: N/A;   CORONARY STENT INTERVENTION N/A 10/22/2021   Procedure: CORONARY STENT INTERVENTION;  Surgeon: ArWellington HampshireMD;  [facility];  Service: Cardiovascular;  Laterality: N/A;   IR CT HEAD LTD  05/21/2022   IR PERCUTANEOUS ART THROMBECTOMY/INFUSION INTRACRANIAL INC DIAG ANGIO  05/21/2022   LEFT HEART CATH AND CORONARY ANGIOGRAPHY N/A 10/22/2021   Procedure: LEFT HEART CATH AND CORONARY ANGIOGRAPHY;  Surgeon: ArWellington HampshireMD;  [facility];  Service: Cardiovascular;  Laterality: N/A;   LOOP  RECORDER INSERTION N/A 05/23/2022   Procedure: LOOP RECORDER INSERTION;  Surgeon: LaVickie EpleyMD;  [facility];  Service: Cardiovascular;  Laterality: N/A;   RADIOLOGY WITH ANESTHESIA N/A 05/20/2022   Procedure: IR WITH ANESTHESIA;  Surgeon: DeLuanne BrasMD;  [place] [facility] Service: Radiology;  Laterality: N/A;   ROTATOR CUFF REPAIR     Patient Active Problem List   Diagnosis Date Noted   Stroke (cerebrum) ([facility]/10/2021   Middle cerebral artery embolism, 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 male with PMH: HTN, CAD, HLD, osteoarthritis, SCC, sleep apnea. He presented to the hospital on 05/20/22 with right sided weakness and aphasia with LKW reported by wife to be 10pm on 10/2.  [facility] and [facility] for emergent CT/CTA which demonstrated left M1 occlusion. He received IV tenecteplase and IR thrombectomy due to large vessel occlusion.  DIAGNOSTIC FINDINGS:  MRI 05/22/2022 Cortical ischemia throughout the left MCA territory compatible with recent infarct. Relatively little affect on the white matter. 2. Small focus of acute hemorrhage along the anterior surface of the left temporal lobe Heidelberg classification 3c: Subarachnoid hemorrhage. 3. Normal intracranial MRA.  Restored flow in left MCA.    THERAPY DIAG:  Aphasia  Apraxia  Middle cerebral artery embolism, left  Rationale for Evaluation and Treatment Rehabilitation  SUBJECTIVE: "he gets frustrated at home"  Pt accompanied by: self and significant other  PAIN:  Are you  having pain? No  PATIENT GOALS: to be able to communicate again  OBJECTIVE:   TODAY'S TREATMENT: Skilled treatment session focused on pt's communication goals. SLP facilitated session by providing the following interventions:    With use of carrier phrase and moderate assistance, pt able to name basic objects in 6 out of 10 opportunities.    Pt picked up pencil showing independent initiation of writing down information to repair communication breakdown. Pt was effective in using written language to repair communication.         PATIENT EDUCATION: Education details: ways to use spelling as pt's strength for communication Person educated: Patient and Parent Education method: Explanation, Demonstration, Verbal cues, and Handouts Education comprehension: verbalized understanding  GOALS: Goals reviewed with patient? Yes  SHORT TERM GOALS: Target date: 10 sessions  Given moderate assistance, pt will answer simple basic yes/no questions with 50% accuracy.  Baseline: 25% Goal status: INITIAL   2.  Given moderate assistance, pt will follow simple basic 1 step directions with 50% accuracy.  Baseline: 0% Goal status: INITIAL   3.  With maximal multimodal cues, pt will name set of 10 common objects in 8 out of 10 opportunities.  Baseline: unable Goal status: INITIAL   4. Pt will improve reading abilities by placing phrase level object function with object in field of 3 with 50% ability and moderate cues.              Baseline: object name to object - 90%             Goal status: INITIAL   5. Pt will improve spelling abilities by completing common words by filling in missing letter with 50% ability and moderate cues.              Baseline: unable             Goal status: INITIAL   6. Pt will repeat rote phrases with 25% speech intelligibility given max to moderate cues.              Baseline: unable             Goal status: INITIAL   LONG TERM GOALS: Target date: 08/26/2021    Pt will use multimodal communication to express basic wants/needs in 4 of 10 opportunities.  Baseline: unable Goal status: INITIAL   2.  Pt will demonstrate increased reading comprehension by selecting accurate answer from 3 choices in 2 out of 10 opportunities.  Baseline: unable Goal status: INITIAL   3.  Pt will correctly spell his name in 5 out of 10 opportunities.  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 Improved verbal abilities noted with carrier phrases.   OBJECTIVE IMPAIRMENTS include expressive language, receptive language, aphasia, and apraxia. These impairments are limiting patient  from managing medications, managing appointments, managing finances, household responsibilities, ADLs/IADLs, and effectively communicating at home and in community. Factors affecting potential to achieve goals and functional outcome are severity of impairments. Patient will benefit from skilled SLP services to address above impairments and improve overall function.  REHAB POTENTIAL: Excellent  PLAN: SLP FREQUENCY: 3x/week  SLP DURATION: 12 weeks  PLANNED INTERVENTIONS: Language facilitation, Environmental controls, Internal/external aids, Functional tasks, Multimodal communication approach, SLP instruction and feedback, Compensatory strategies, and Patient/family education   Finnleigh Marchetti B. Rutherford Nail, M.S., CCC-SLP, Mining engineer Certified Brain Injury [place]  [place] Office [phone] Ascom [phone] Fax [phone]

## 2022-06-24 NOTE — Therapy (Addendum)
OUTPATIENT SPEECH LANGUAGE PATHOLOGY TREATMENT NOTE PROGRESS NOTE   Patient Name: Alan Stewart MRN: [id number] DOB:13-Apr-1946, 76 y.o., male Today's Date: 06/24/2022   Speech Therapy Progress Note  Dates of Reporting Period: 06/03/2022 to 06/24/2022  Objective: Patient has been seen for 10 speech therapy sessions this reporting period targeting pt's aphaisa. Patient is making progress toward LTGs and met 6 of 6 STGs this reporting period. See skilled intervention, clinical impressions, and goals below for details.   PCP: Ramonita Lab, MD REFERRING PROVIDER: Ramonita Lab, MD  END OF SESSION:   End of Session - 06/24/2022     Visit Number 10   Number of Visits 25    Date for SLP Re-Evaluation 08/26/22    Authorization Type [employer] HMO    Progress Note Due on Visit 10    SLP Start Time 0900   SLP Stop Time  1000   SLP Time Calculation (min) 60 min    Activity Tolerance Patient tolerated treatment well             Past Medical History:  Diagnosis Date   CAD (coronary artery disease)    a. 10/2021 NSTEMI/PCI: LM nl, LAD min irregs, LCX 78m(2.5x18 Onyx Frontier DES), OM1 nl, RCA 954m3.5x26 Onyx Frontier DES), 30d.   Diastolic dysfunction    a. 10/2021 Echo: EF 50-55%, no rwma, Gr1 DD, nl RV fxn. No significant valvular dzs.   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 SCCis. Tx pending   Past Surgical History:  Procedure Laterality Date   COLONOSCOPY N/A 10/09/2015   Procedure: COLONOSCOPY;  Surgeon: PaHulen LusterMD;  [facility];  Service: Gastroenterology;  Laterality: N/A;   CORONARY STENT INTERVENTION N/A 10/22/2021   Procedure: CORONARY STENT INTERVENTION;  Surgeon: ArWellington HampshireMD;  [facility];  Service:  Cardiovascular;  Laterality: N/A;   IR CT HEAD LTD  05/21/2022   IR PERCUTANEOUS ART THROMBECTOMY/INFUSION INTRACRANIAL INC DIAG ANGIO  05/21/2022   LEFT HEART CATH AND CORONARY ANGIOGRAPHY N/A 10/22/2021   Procedure: LEFT HEART CATH AND CORONARY ANGIOGRAPHY;  Surgeon: ArWellington HampshireMD;  [place] [facility];  Service: Cardiovascular;  Laterality: N/A;   LOOP RECORDER INSERTION N/A 05/23/2022   Procedure: LOOP RECORDER INSERTION;  Surgeon: LaVickie EpleyMD;  [place] [facility];  Service: Cardiovascular;  Laterality: N/A;   RADIOLOGY WITH ANESTHESIA N/A 05/20/2022   Procedure: IR WITH ANESTHESIA;  Surgeon: DeLuanne BrasMD;  [place] [facility] Service: Radiology;  Laterality: N/A;   ROTATOR CUFF REPAIR     Patient Active Problem List   Diagnosis Date Noted   Stroke (cerebrum) ([facility]10/10/2021   Middle cerebral artery embolism, 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 male with PMH: HTN, CAD, HLD, osteoarthritis, SCC, sleep apnea. He presented  to the hospital on 05/20/22 with right sided weakness and aphasia with LKW reported by wife to be 10pm on 10/2.  He arrived at [facility] as code stroke and taken for emergent CT/CTA which demonstrated left M1 occlusion. He received IV tenecteplase and IR thrombectomy due to large vessel occlusion.       DIAGNOSTIC FINDINGS:  MRI 05/22/2022 Cortical ischemia throughout the left MCA territory compatible with recent infarct. Relatively little affect on the white matter. 2. Small focus of acute hemorrhage along the anterior surface of the left  temporal lobe Heidelberg classification 3c: Subarachnoid hemorrhage. 3. Normal intracranial MRA.  Restored flow in left MCA.    THERAPY DIAG:  Aphasia  Apraxia  Middle cerebral artery embolism, left  Rationale for Evaluation and Treatment Rehabilitation  SUBJECTIVE: "I have a question for you, he really struggles with the homework"  Pt accompanied by: self and significant other  PAIN:  Are you having pain? No  PATIENT GOALS: to be able to communicate again  OBJECTIVE:   TODAY'S TREATMENT: Skilled treatment session focused on pt's communication goals. SLP facilitated session by providing the following interventions:    Reading at the word level - [facility]: Aphasia Rehab; Unit 1- Matching and indentification pp24-31 - 100% increased speech intelligibility when saying each word with SLP to ~90% including multi-syllabic words Increased to the phrase level description of function pp32-36 - 98%  Independent naming of pets and his wife phonemic cues only needed - much progress  Naming basic objects - carrier phrase with phonemic cue only writing down information - self-monitoring, self-correcting Independent with inhibiting rote spelling utterances        PATIENT EDUCATION: Education details: progress made Person educated: Patient and Parent Education method: Explanation, Demonstration, Verbal cues, and Handouts Education comprehension: verbalized understanding  GOALS: Goals reviewed with patient? Yes  SHORT TERM GOALS: Target date: 10 sessions  UPDATED - 06/24/2022 Given moderate assistance, pt will answer simple basic yes/no questions with 50% accuracy.  Baseline: 25% Goal status: MET UPDATED goal: Given minimal assistance, pt will answer simple basic yes/no questions with 75% accuracy.    2.  Given moderate assistance, pt will follow simple basic 1 step directions with 50% accuracy.  Baseline: 0% Goal status: MET  UPDATED goal: Given minimal assistance, pt will  following simple basic 1 step directions with 75% accuracy.    3.  With maximal multimodal cues, pt will name set of 10 common objects in 8 out of 10 opportunities.  Baseline: unable Goal status: MET UPDATED goal: With moderate cues, pt will name set of 10 common objects in 8 out of 10 opportunities.    4. Pt will improve reading abilities by placing phrase level object function with object in field of 3 with 50% ability and moderate cues.              Baseline: object name to object - 90%             Goal status: MET  UPDATED goal: Pt will demonstrate reading comprehension at the sentence level for basic information with > 75% accuracy and min A cues.    5. Pt will improve spelling abilities by completing common words by filling in missing letter with 50% ability and moderate cues.              Baseline: unable             Goal status: MET UPDATED goal: Pt will improve spelling abilities by  completing common words by filling in missing letters with >90% accuracy and rare min A cues.    6. Pt will repeat rote phrases with 25% speech intelligibility given max to moderate cues.              Baseline: unable             Goal status: MET  UPDATED goal: Pt will repeat rote phrases with ~ 75% speech intelligibility given minimal cues.     LONG TERM GOALS: Target date: 08/26/2021   Pt will use multimodal communication to express basic wants/needs in 4 of 10 opportunities.  Baseline: unable Goal status: ONGOING   2.  Pt will demonstrate increased reading comprehension by selecting accurate answer from 3 choices in 2 out of 10 opportunities.  Baseline: unable Goal status: ONGOING   3.  Pt will correctly spell his name in 5 out of 10 opportunities.  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 Pt presents  with improved reading abilities at the phrase level, spelling continues to be strength with increased independent use writing to aid in communication. Over the course of the last 10 treatment sessions, pt has made good steady progress towards his goals.    OBJECTIVE IMPAIRMENTS include expressive language, receptive language, aphasia, and apraxia. These impairments are limiting patient from managing medications, managing appointments, managing finances, household responsibilities, ADLs/IADLs, and effectively communicating at home and in community. Factors affecting potential to achieve goals and functional outcome are severity of impairments. Patient will benefit from skilled SLP services to address above impairments and improve overall function.  REHAB POTENTIAL: Excellent  PLAN: SLP FREQUENCY: 3x/week  SLP DURATION: 12 weeks  PLANNED INTERVENTIONS: Language facilitation, Environmental controls, Internal/external aids, Functional tasks, Multimodal communication approach, SLP instruction and feedback, Compensatory strategies, and Patient/family education   Joseantonio Dittmar B. Rutherford Nail, M.S., CCC-SLP, Mining engineer Certified Brain Injury [place]  [place] Office [phone] Ascom [phone] Fax [phone]

## 2022-06-24 NOTE — Therapy (Deleted)
OUTPATIENT SPEECH LANGUAGE PATHOLOGY TREATMENT NOTE 10TH VISIT PROGRESS NOTE   Patient Name: VONDELL BABERS MRN: [id number] DOB:1946/08/14, 76 y.o., male Today's Date: 06/24/2022   Speech Therapy Progress Note  Dates of Reporting Period: *** to 06/24/2022  Objective: Patient has been seen for 10 speech therapy sessions this reporting period targeting ***. Patient is making progress toward LTGs and met *** STGs this reporting period. See skilled intervention, clinical impressions, and goals below for details.  PCP: Ramonita Lab, MD REFERRING PROVIDER: Ramonita Lab, MD  END OF SESSION:   End of Session - 06/24/2022 1726     Visit Number 10   Number of Visits 25    Date for SLP Re-Evaluation 08/26/22    Authorization Type [employer] HMO    Progress Note Due on Visit 10    SLP Start Time 1400    SLP Stop Time  1500    SLP Time Calculation (min) 60 min    Activity Tolerance Patient tolerated treatment well             Past Medical History:  Diagnosis Date   CAD (coronary artery disease)    a. 10/2021 NSTEMI/PCI: LM nl, LAD min irregs, LCX 2m(2.5x18 Onyx Frontier DES), OM1 nl, RCA 933m3.5x26 Onyx Frontier DES), 30d.   Diastolic dysfunction    a. 10/2021 Echo: EF 50-55%, no rwma, Gr1 DD, nl RV fxn. No significant valvular dzs.   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 SCCis. Tx pending   Past Surgical History:  Procedure Laterality Date   COLONOSCOPY N/A 10/09/2015   Procedure: COLONOSCOPY;  Surgeon: PaHulen LusterMD;  [facility];  Service: Gastroenterology;  Laterality: N/A;   CORONARY STENT INTERVENTION N/A 10/22/2021   Procedure: CORONARY STENT INTERVENTION;  Surgeon: ArWellington HampshireMD;  [place] [facility];  Service:  Cardiovascular;  Laterality: N/A;   IR CT HEAD LTD  05/21/2022   IR PERCUTANEOUS ART THROMBECTOMY/INFUSION INTRACRANIAL INC DIAG ANGIO  05/21/2022   LEFT HEART CATH AND CORONARY ANGIOGRAPHY N/A 10/22/2021   Procedure: LEFT HEART CATH AND CORONARY ANGIOGRAPHY;  Surgeon: ArWellington HampshireMD;  [place] [facility] [place];  Service: Cardiovascular;  Laterality: N/A;   LOOP RECORDER INSERTION N/A 05/23/2022   Procedure: LOOP RECORDER INSERTION;  Surgeon: LaVickie EpleyMD;  [place] [facility] [place];  Service: Cardiovascular;  Laterality: N/A;   RADIOLOGY WITH ANESTHESIA N/A 05/20/2022   Procedure: IR WITH ANESTHESIA;  Surgeon: DeLuanne BrasMD;  [place] [facility] Service: Radiology;  Laterality: N/A;   ROTATOR CUFF REPAIR     Patient Active Problem List   Diagnosis Date Noted   Stroke (cerebrum) ([facility]10/10/2021   Middle cerebral artery embolism, 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 male with PMH: HTN, CAD, HLD, osteoarthritis, SCC, sleep apnea. He  presented to the hospital on 05/20/22 with right sided weakness and aphasia with LKW reported by wife to be 10pm on 10/2.  He arrived at [facility] as code stroke and taken for emergent CT/CTA which demonstrated left M1 occlusion. He received IV tenecteplase and IR thrombectomy due to large vessel occlusion.       DIAGNOSTIC FINDINGS:  MRI 05/22/2022 Cortical ischemia throughout the left MCA territory compatible with recent infarct. Relatively little affect on the white matter. 2. Small focus of acute hemorrhage along the anterior surface of the left  temporal lobe Heidelberg classification 3c: Subarachnoid hemorrhage. 3. Normal intracranial MRA.  Restored flow in left MCA.    THERAPY DIAG:  Aphasia  Apraxia  Middle cerebral artery embolism, left  Rationale for Evaluation and Treatment Rehabilitation  SUBJECTIVE: "he gets frustrated at home"  Pt accompanied by: self and significant other  PAIN:  Are you having pain? No  PATIENT GOALS: to be able to communicate again  OBJECTIVE:   TODAY'S TREATMENT: Skilled treatment session focused on pt's communication goals. SLP facilitated session by providing the following interventions:    With use of carrier phrase and moderate assistance, pt able to name basic objects in 6 out of 10 opportunities.    Pt picked up pencil showing independent initiation of writing down information to repair communication breakdown. Pt was effective in using written language to repair communication.         PATIENT EDUCATION: Education details: ways to use spelling as pt's strength for communication Person educated: Patient and Parent Education method: Explanation, Demonstration, Verbal cues, and Handouts Education comprehension: verbalized understanding  GOALS: Goals reviewed with patient? Yes  SHORT TERM GOALS: Target date: 10 sessions  Given moderate assistance, pt will answer simple basic yes/no questions with 50% accuracy.  Baseline: 25% Goal status: INITIAL   2.  Given moderate assistance, pt will follow simple basic 1 step directions with 50% accuracy.  Baseline: 0% Goal status: INITIAL   3.  With maximal multimodal cues, pt will name set of 10 common objects in 8 out of 10 opportunities.  Baseline: unable Goal status: INITIAL   4. Pt will improve reading abilities by placing phrase level object function with object in field of 3 with 50% ability and moderate cues.              Baseline: object name to object - 90%             Goal status: INITIAL   5. Pt will improve spelling  abilities by completing common words by filling in missing letter with 50% ability and moderate cues.              Baseline: unable             Goal status: INITIAL   6. Pt will repeat rote phrases with 25% speech intelligibility given max to moderate cues.              Baseline: unable             Goal status: INITIAL   LONG TERM GOALS: Target date: 08/26/2021   Pt will use multimodal communication to express basic wants/needs in 4 of 10 opportunities.  Baseline: unable Goal status: INITIAL   2.  Pt will demonstrate increased reading comprehension by selecting accurate answer from 3 choices in 2 out of 10 opportunities.  Baseline: unable Goal status: INITIAL   3.  Pt will correctly spell his name in 5 out of  10 opportunities.  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 Improved verbal abilities noted with carrier phrases.   OBJECTIVE IMPAIRMENTS include expressive language, receptive language, aphasia, and apraxia. These impairments are limiting patient from managing medications, managing appointments, managing finances, household responsibilities, ADLs/IADLs, and effectively communicating at home and in community. Factors affecting potential to achieve goals and functional outcome are severity of impairments. Patient will benefit from skilled SLP services to address above impairments and improve overall function.  REHAB POTENTIAL: Excellent  PLAN: SLP FREQUENCY: 3x/week  SLP DURATION: 12 weeks  PLANNED INTERVENTIONS: Language facilitation, Environmental controls, Internal/external aids, Functional tasks, Multimodal communication approach, SLP instruction and feedback, Compensatory strategies, and Patient/family education   Azion Centrella B. Rutherford Nail, M.S., CCC-SLP, Product manager Certified Brain Injury [place]  [place] Office [phone] Ascom [phone] Fax [phone]

## 2022-06-25 ENCOUNTER — Ambulatory Visit: Payer: Medicare HMO | Admitting: Speech Pathology

## 2022-06-25 DIAGNOSIS — R4701 Aphasia: Secondary | ICD-10-CM

## 2022-06-25 DIAGNOSIS — I6602 Occlusion and stenosis of left middle cerebral artery: Secondary | ICD-10-CM | POA: Diagnosis not present

## 2022-06-25 DIAGNOSIS — R482 Apraxia: Secondary | ICD-10-CM | POA: Diagnosis not present

## 2022-06-25 NOTE — Therapy (Addendum)
OUTPATIENT SPEECH LANGUAGE PATHOLOGY TREATMENT NOTE   Patient Name: Alan Stewart MRN: [id number] DOB:1946/08/19, 76 y.o., male Today's Date: 06/25/2022      PCP: Ramonita Lab, MD REFERRING PROVIDER: Ramonita Lab, MD  END OF SESSION:   End of Session - 06/25/2022     Visit Number 11   Number of Visits 25    Date for SLP Re-Evaluation 08/26/22    Authorization Type [employer] H[employer]    Progress Note Due on Visit 74    SLP Start Time 1100   SLP Stop Time  1200    SLP Time Calculation (min) 60 min    Activity Tolerance Patient tolerated treatment well             Past Medical History:  Diagnosis Date   CAD (coronary artery disease)    a. 10/2021 NSTEMI/PCI: LM nl, LAD min irregs, LCX 66m(2.5x18 Onyx Frontier DES), OM1 nl, RCA 916m3.5x26 Onyx Frontier DES), 30d.   Diastolic dysfunction    a. 10/2021 Echo: EF 50-55%, no rwma, Gr1 DD, nl RV fxn. No significant valvular dzs.   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 SCCis. Tx pending   Past Surgical History:  Procedure Laterality Date   COLONOSCOPY N/A 10/09/2015   Procedure: COLONOSCOPY;  Surgeon: PaHulen LusterMD;  [facility]  Service: Gastroenterology;  Laterality: N/A;   CORONARY STENT INTERVENTION N/A 10/22/2021   Procedure: CORONARY STENT INTERVENTION;  Surgeon: ArWellington HampshireMD;  [facility];  Service: Cardiovascular;  Laterality: N/A;   IR CT HEAD LTD  05/21/2022   IR PERCUTANEOUS ART THROMBECTOMY/INFUSION INTRACRANIAL INC DIAG ANGIO  05/21/2022   LEFT HEART CATH AND CORONARY ANGIOGRAPHY N/A 10/22/2021   Procedure: LEFT HEART CATH AND CORONARY ANGIOGRAPHY;  Surgeon: ArWellington HampshireMD;  [facility];  Service: Cardiovascular;  Laterality: N/A;   LOOP  RECORDER INSERTION N/A 05/23/2022   Procedure: LOOP RECORDER INSERTION;  Surgeon: LaVickie EpleyMD;  [facility];  Service: Cardiovascular;  Laterality: N/A;   RADIOLOGY WITH ANESTHESIA N/A 05/20/2022   Procedure: IR WITH ANESTHESIA;  Surgeon: DeLuanne BrasMD;  [place] [facility] Service: Radiology;  Laterality: N/A;   ROTATOR CUFF REPAIR     Patient Active Problem List   Diagnosis Date Noted   Stroke (cerebrum) (HC[facility] [place]10/10/2021   Middle cerebral artery embolism, 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 male with PMH: HTN, CAD, HLD, osteoarthritis, SCC, sleep apnea. He presented to the hospital on 05/20/22 with right sided weakness and aphasia with LKW reported by wife to be 10pm on 10/2.  [facility] for emergent CT/CTA which demonstrated left M1 occlusion. He received IV tenecteplase and IR thrombectomy due to large vessel occlusion.  DIAGNOSTIC FINDINGS:  MRI 05/22/2022 Cortical ischemia throughout the left MCA territory compatible with recent infarct. Relatively little affect on the white matter. 2. Small focus of acute hemorrhage along the anterior surface of the left temporal lobe Heidelberg classification 3c: Subarachnoid hemorrhage. 3. Normal intracranial MRA.  Restored flow in left MCA.    THERAPY DIAG:  Aphasia  Apraxia  Middle cerebral artery embolism, left  Rationale for Evaluation and Treatment Rehabilitation  SUBJECTIVE: "I have a question for you, he really struggles with the homework"  Pt accompanied by: self and  significant other  PAIN:  Are you having pain? No  PATIENT GOALS: to be able to communicate again  OBJECTIVE:   TODAY'S TREATMENT: Skilled treatment session focused on pt's communication goals. SLP facilitated session by providing the following interventions:    Min A provided for accurate spelling of basic common objects to achieve 9 out of 10 accuracy   Max multimodal faded to moderate multimodal for speech intelligibility targeting correct production of consonant blends - pt with increased motor errors but when paired with functional language pt was able to repeat "sh" and use accurately in the initial position       PATIENT EDUCATION: Education details: progress made Person educated: Patient and Parent Education method: Explanation, Demonstration, Verbal cues, and Handouts Education comprehension: verbalized understanding  GOALS: Goals reviewed with patient? Yes  SHORT TERM GOALS: Target date: 10 sessions  UPDATED - 06/24/2022 Given moderate assistance, pt will answer simple basic yes/no questions with 50% accuracy.  Baseline: 25% Goal status: MET UPDATED goal: Given minimal assistance, pt will answer simple basic yes/no questions with 75% accuracy.    2.  Given moderate assistance, pt will follow simple basic 1 step directions with 50% accuracy.  Baseline: 0% Goal status: MET  UPDATED goal: Given minimal assistance, pt will following simple basic 1 step directions with 75% accuracy.    3.  With maximal multimodal cues, pt will name set of 10 common objects in 8 out of 10 opportunities.  Baseline: unable Goal status: MET UPDATED goal: With moderate cues, pt will name set of 10 common objects in 8 out of 10 opportunities.    4. Pt will improve reading abilities by placing phrase level object function with object in field of 3 with 50% ability and moderate cues.              Baseline: object name to object - 90%             Goal status: MET  UPDATED goal: Pt will  demonstrate reading comprehension at the sentence level for basic information with > 75% accuracy and min A cues.    5. Pt will improve spelling abilities by completing common words by filling in missing letter with 50% ability and moderate cues.              Baseline: unable             Goal status: MET UPDATED goal: Pt will improve spelling abilities by completing common words by filling in missing letters with >90% accuracy and rare min A cues.    6. Pt will repeat rote phrases with 25% speech intelligibility given max to moderate cues.              Baseline: unable             Goal status: MET  UPDATED goal: Pt will repeat rote phrases with ~ 75% speech intelligibility given minimal cues.  LONG TERM GOALS: Target date: 08/26/2021   Pt will use multimodal communication to express basic wants/needs in 4 of 10 opportunities.  Baseline: unable Goal status: ONGOING   2.  Pt will demonstrate increased reading comprehension by selecting accurate answer from 3 choices in 2 out of 10 opportunities.  Baseline: unable Goal status: ONGOING   3.  Pt will correctly spell his name in 5 out of 10 opportunities.  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 Pt presents with improved reading abilities at the phrase level, spelling continues to be strength with increased independent use writing to aid in communication. Pt with increased islands of intelligible speech with consonant cluster reduction and diagraph difficulties being the most affected. .   OBJECTIVE IMPAIRMENTS include expressive language, receptive language, aphasia, and apraxia. These impairments are limiting patient from managing medications, managing appointments, managing finances, household responsibilities, ADLs/IADLs, and effectively  communicating at home and in community. Factors affecting potential to achieve goals and functional outcome are severity of impairments. Patient will benefit from skilled SLP services to address above impairments and improve overall function.  REHAB POTENTIAL: Excellent  PLAN: SLP FREQUENCY: 3x/week  SLP DURATION: 12 weeks  PLANNED INTERVENTIONS: Language facilitation, Environmental controls, Internal/external aids, Functional tasks, Multimodal communication approach, SLP instruction and feedback, Compensatory strategies, and Patient/family education   Braydon Kullman B. Rutherford Nail, M.S., CCC-SLP, Mining engineer Certified Brain Injury [place]  [place] Office [phone] Ascom [phone] Fax [phone]

## 2022-06-26 ENCOUNTER — Ambulatory Visit: Payer: Medicare HMO | Admitting: Dermatology

## 2022-06-26 NOTE — Telephone Encounter (Signed)
Patient's spouse has called office and they would like to go to [facility] in [place]. aw

## 2022-06-27 ENCOUNTER — Ambulatory Visit: Payer: Medicare HMO | Admitting: Speech Pathology

## 2022-06-27 ENCOUNTER — Ambulatory Visit: Payer: Medicare HMO

## 2022-06-27 NOTE — Addendum Note (Signed)
Addended by: Johnsie Kindred R on: 06/27/2022 11:22 AM   Modules accepted: Orders

## 2022-06-28 ENCOUNTER — Ambulatory Visit: Payer: Medicare HMO | Admitting: Speech Pathology

## 2022-06-28 DIAGNOSIS — R4701 Aphasia: Secondary | ICD-10-CM | POA: Diagnosis not present

## 2022-06-28 DIAGNOSIS — I6602 Occlusion and stenosis of left middle cerebral artery: Secondary | ICD-10-CM

## 2022-06-28 DIAGNOSIS — R482 Apraxia: Secondary | ICD-10-CM | POA: Diagnosis not present

## 2022-07-01 ENCOUNTER — Ambulatory Visit: Payer: Medicare HMO

## 2022-07-01 ENCOUNTER — Ambulatory Visit: Payer: Medicare HMO | Admitting: Speech Pathology

## 2022-07-01 ENCOUNTER — Encounter: Payer: Medicare HMO | Admitting: Occupational Therapy

## 2022-07-01 DIAGNOSIS — R4701 Aphasia: Secondary | ICD-10-CM

## 2022-07-01 DIAGNOSIS — I6602 Occlusion and stenosis of left middle cerebral artery: Secondary | ICD-10-CM

## 2022-07-01 DIAGNOSIS — R482 Apraxia: Secondary | ICD-10-CM | POA: Diagnosis not present

## 2022-07-01 NOTE — Therapy (Unsigned)
OUTPATIENT SPEECH LANGUAGE PATHOLOGY TREATMENT NOTE   Patient Name: Alan Stewart MRN: [id number] DOB:October 28, 1945, 76 y.o., male Today's Date: 07/01/2022      PCP: Ramonita Lab, MD REFERRING PROVIDER: Ramonita Lab, MD  END OF SESSION:   End of Session - 07/01/2022     Visit Number 13   Number of Visits 25    Date for SLP Re-Evaluation 08/26/22    Authorization Type [employer] HMO    Progress Note Due on Visit 3    SLP Start Time 60   SLP Stop Time  1400   SLP Time Calculation (min) 60 min    Activity Tolerance Patient tolerated treatment well             Past Medical History:  Diagnosis Date   CAD (coronary artery disease)    a. 10/2021 NSTEMI/PCI: LM nl, LAD min irregs, LCX 54m(2.5x18 Onyx Frontier DES), OM1 nl, RCA 991m3.5x26 Onyx Frontier DES), 30d.   Diastolic dysfunction    a. 10/2021 Echo: EF 50-55%, no rwma, Gr1 DD, nl RV fxn. No significant valvular dzs.   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 SCCis. Tx pending   Past Surgical History:  Procedure Laterality Date   COLONOSCOPY N/A 10/09/2015   Procedure: COLONOSCOPY;  Surgeon: PaHulen LusterMD;  [facility];  Service: Gastroenterology;  Laterality: N/A;   CORONARY STENT INTERVENTION N/A 10/22/2021   Procedure: CORONARY STENT INTERVENTION;  Surgeon: ArWellington HampshireMD;  [facility];  Service: Cardiovascular;  Laterality: N/A;   IR CT HEAD LTD  05/21/2022   IR PERCUTANEOUS ART THROMBECTOMY/INFUSION INTRACRANIAL INC DIAG ANGIO  05/21/2022   LEFT HEART CATH AND CORONARY ANGIOGRAPHY N/A 10/22/2021   Procedure: LEFT HEART CATH AND CORONARY ANGIOGRAPHY;  Surgeon: ArWellington HampshireMD;  [place];  Service: Cardiovascular;  Laterality: N/A;   LOOP  RECORDER INSERTION N/A 05/23/2022   Procedure: LOOP RECORDER INSERTION;  Surgeon: LaVickie EpleyMD;  [place];  Service: Cardiovascular;  Laterality: N/A;   RADIOLOGY WITH ANESTHESIA N/A 05/20/2022   Procedure: IR WITH ANESTHESIA;  Surgeon: DeLuanne BrasMD;  [place] [facility] Service: Radiology;  Laterality: N/A;   ROTATOR CUFF REPAIR     Patient Active Problem List   Diagnosis Date Noted   Stroke (cerebrum) (HCMill [place]10/10/2021   Middle cerebral artery embolism, 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 male with PMH: HTN, CAD, HLD, osteoarthritis, SCC, sleep apnea. He presented to the hospital on 05/20/22 with right sided weakness and aphasia with LKW reported by wife to be 10pm on 10/2.  [facility]/CTA which demonstrated left M1 occlusion. He received IV tenecteplase and IR thrombectomy due to large vessel occlusion.  DIAGNOSTIC FINDINGS:  MRI 05/22/2022 Cortical ischemia throughout the left MCA territory compatible with recent infarct. Relatively little affect on the white matter. 2. Small focus of acute hemorrhage along the anterior surface of the left temporal lobe Heidelberg classification 3c: Subarachnoid hemorrhage. 3. Normal intracranial MRA.  Restored flow in left MCA.    THERAPY DIAG:  Aphasia  Apraxia  Middle cerebral artery embolism, left  Rationale for Evaluation and Treatment Rehabilitation  SUBJECTIVE: "I have a question for you, he really struggles with the homework"  Pt accompanied by: self and  significant other  PAIN:  Are you having pain? No  PATIENT GOALS: to be able to communicate again  OBJECTIVE:   TODAY'S TREATMENT: Skilled treatment session focused on pt's communication goals. SLP facilitated session by providing the following interventions:    Review goals from PN Singing together Telling story of wife's confusion over dermatology appt Pt gesturing, answering yes/no questions accurately to communicate [place] - made word scramble with family members names      PATIENT EDUCATION: Education details: progress made Person educated: Patient and Parent Education method: Explanation, Demonstration, Verbal cues, and Handouts Education comprehension: verbalized understanding  GOALS: Goals reviewed with patient? Yes  SHORT TERM GOALS: Target date: 10 sessions  UPDATED - 06/24/2022 Given moderate assistance, pt will answer simple basic yes/no questions with 50% accuracy.  Baseline: 25% Goal status: MET UPDATED goal: Given minimal assistance, pt will answer simple basic yes/no questions with 75% accuracy.    2.  Given moderate assistance, pt will follow simple basic 1 step directions with 50% accuracy.  Baseline: 0% Goal status: MET  UPDATED goal: Given minimal assistance, pt will following simple basic 1 step directions with 75% accuracy.    3.  With maximal multimodal cues, pt will name set of 10 common objects in 8 out of 10 opportunities.  Baseline: unable Goal status: MET UPDATED goal: With moderate cues, pt will name set of 10 common objects in 8 out of 10 opportunities.    4. Pt will improve reading abilities by placing phrase level object function with object in field of 3 with 50% ability and moderate cues.              Baseline: object name to object - 90%             Goal status: MET  UPDATED goal: Pt will demonstrate reading comprehension at the sentence level for basic information with > 75% accuracy and min A cues.    5. Pt will improve  spelling abilities by completing common words by filling in missing letter with 50% ability and moderate cues.              Baseline: unable             Goal status: MET UPDATED goal: Pt will improve spelling abilities by completing common words by filling in missing letters with >90% accuracy and rare min A cues.    6. Pt will repeat rote phrases with 25% speech intelligibility given max to moderate cues.              Baseline: unable             Goal status: MET  UPDATED goal: Pt will repeat rote phrases with ~ 75% speech intelligibility given minimal cues.     LONG TERM GOALS: Target date: 08/26/2021   Pt will use multimodal communication to express basic wants/needs in 4 of 10 opportunities.  Baseline: unable Goal  status: ONGOING   2.  Pt will demonstrate increased reading comprehension by selecting accurate answer from 3 choices in 2 out of 10 opportunities.  Baseline: unable Goal status: ONGOING   3.  Pt will correctly spell his name in 5 out of 10 opportunities.  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 Pt presents with improved reading abilities at the phrase level, spelling continues to be strength with increased independent use writing to aid in communication. Pt with increased islands of intelligible speech with consonant cluster reduction and diagraph difficulties being the most affected. .   OBJECTIVE IMPAIRMENTS include expressive language, receptive language, aphasia, and apraxia. These impairments are limiting patient from managing medications, managing appointments, managing finances, household responsibilities, ADLs/IADLs, and effectively communicating at home and in community. Factors affecting potential to achieve goals and functional outcome are severity of impairments. Patient  will benefit from skilled SLP services to address above impairments and improve overall function.  REHAB POTENTIAL: Excellent  PLAN: SLP FREQUENCY: 3x/week  SLP DURATION: 12 weeks  PLANNED INTERVENTIONS: Language facilitation, Environmental controls, Internal/external aids, Functional tasks, Multimodal communication approach, SLP instruction and feedback, Compensatory strategies, and Patient/family education   Jadene Stemmer B. Rutherford Nail, M.S., CCC-SLP, Mining engineer Certified Brain Injury [place]  [place] Office [phone] Ascom [phone] Fax [phone]

## 2022-07-01 NOTE — Therapy (Signed)
OUTPATIENT SPEECH LANGUAGE PATHOLOGY TREATMENT NOTE   Patient Name: Alan Stewart MRN: [id number] DOB:01-Jan-1946, 76 y.o., male Today's Date: 07/01/2022      PCP: Ramonita Lab, MD REFERRING PROVIDER: Ramonita Lab, MD  END OF SESSION:   End of Session - 06/28/2022     Visit Number 12   Number of Visits 25    Date for SLP Re-Evaluation 08/26/22    Authorization Type [employer] H[employer]    Progress Note Due on Visit 43    SLP Start Time 1000   SLP Stop Time  1100   SLP Time Calculation (min) 60 min    Activity Tolerance Patient tolerated treatment well             Past Medical History:  Diagnosis Date   CAD (coronary artery disease)    a. 10/2021 NSTEMI/PCI: LM nl, LAD min irregs, LCX 66m(2.5x18 Onyx Frontier DES), OM1 nl, RCA 94m3.5x26 Onyx Frontier DES), 30d.   Diastolic dysfunction    a. 10/2021 Echo: EF 50-55%, no rwma, Gr1 DD, nl RV fxn. No significant valvular dzs.   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 SCCis. Tx pending   Past Surgical History:  Procedure Laterality Date   COLONOSCOPY N/A 10/09/2015   Procedure: COLONOSCOPY;  Surgeon: PaHulen LusterMD;  [facility];  Service: Gastroenterology;  Laterality: N/A;   CORONARY STENT INTERVENTION N/A 10/22/2021   Procedure: CORONARY STENT INTERVENTION;  Surgeon: ArWellington HampshireMD;  [facility];  Service: Cardiovascular;  Laterality: N/A;   IR CT HEAD LTD  05/21/2022   IR PERCUTANEOUS ART THROMBECTOMY/INFUSION INTRACRANIAL INC DIAG ANGIO  05/21/2022   LEFT HEART CATH AND CORONARY ANGIOGRAPHY N/A 10/22/2021   Procedure: LEFT HEART CATH AND CORONARY ANGIOGRAPHY;  Surgeon: ArWellington HampshireMD;  [facility];  Service: Cardiovascular;  Laterality: N/A;   LOOP  RECORDER INSERTION N/A 05/23/2022   Procedure: LOOP RECORDER INSERTION;  Surgeon: LaVickie EpleyMD;  [facility]: [facility];  Service: Cardiovascular;  Laterality: N/A;   RADIOLOGY WITH ANESTHESIA N/A 05/20/2022   Procedure: IR WITH ANESTHESIA;  Surgeon: DeLuanne BrasMD;  [place] [facility] Service: Radiology;  Laterality: N/A;   ROTATOR CUFF REPAIR     Patient Active Problem List   Diagnosis Date Noted   Stroke (cerebrum) ([facility]10/10/2021   Middle cerebral artery embolism, 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 male with PMH: HTN, CAD, HLD, osteoarthritis, SCC, sleep apnea. He presented to the hospital on 05/20/22 with right sided weakness and aphasia with LKW reported by wife to be 10pm on 10/2.  [facility]/CT[facility] which demonstrated left M1 occlusion. He received IV tenecteplase and IR thrombectomy due to large vessel occlusion.  DIAGNOSTIC FINDINGS:  MRI 05/22/2022 Cortical ischemia throughout the left MCA territory compatible with recent infarct. Relatively little affect on the white matter. 2. Small focus of acute hemorrhage along the anterior surface of the left temporal lobe Heidelberg classification 3c: Subarachnoid hemorrhage. 3. Normal intracranial MRA.  Restored flow in left MCA.    THERAPY DIAG:  Aphasia  Apraxia  Middle cerebral artery embolism, left  Rationale for Evaluation and Treatment Rehabilitation  SUBJECTIVE: "I have a question for you, he really struggles with the homework"  Pt accompanied by: self and  significant other  PAIN:  Are you having pain? No  PATIENT GOALS: to be able to communicate again  OBJECTIVE:   TODAY'S TREATMENT: Skilled treatment session focused on pt's communication goals. SLP facilitated session by providing the following interventions:    Review goals from PN Singing together Telling story of wife's confusion over dermatology appt Pt gesturing, answering yes/no questions accurately to communicate [place] - made word scramble with family members names      PATIENT EDUCATION: Education details: progress made Person educated: Patient and Parent Education method: Explanation, Demonstration, Verbal cues, and Handouts Education comprehension: verbalized understanding  GOALS: Goals reviewed with patient? Yes  SHORT TERM GOALS: Target date: 10 sessions  UPDATED - 06/24/2022 Given moderate assistance, pt will answer simple basic yes/no questions with 50% accuracy.  Baseline: 25% Goal status: MET UPDATED goal: Given minimal assistance, pt will answer simple basic yes/no questions with 75% accuracy.    2.  Given moderate assistance, pt will follow simple basic 1 step directions with 50% accuracy.  Baseline: 0% Goal status: MET  UPDATED goal: Given minimal assistance, pt will following simple basic 1 step directions with 75% accuracy.    3.  With maximal multimodal cues, pt will name set of 10 common objects in 8 out of 10 opportunities.  Baseline: unable Goal status: MET UPDATED goal: With moderate cues, pt will name set of 10 common objects in 8 out of 10 opportunities.    4. Pt will improve reading abilities by placing phrase level object function with object in field of 3 with 50% ability and moderate cues.              Baseline: object name to object - 90%             Goal status: MET  UPDATED goal: Pt will demonstrate reading comprehension at the sentence level for basic information with > 75% accuracy and min A cues.    5. Pt will improve  spelling abilities by completing common words by filling in missing letter with 50% ability and moderate cues.              Baseline: unable             Goal status: MET UPDATED goal: Pt will improve spelling abilities by completing common words by filling in missing letters with >90% accuracy and rare min A cues.    6. Pt will repeat rote phrases with 25% speech intelligibility given max to moderate cues.              Baseline: unable             Goal status: MET  UPDATED goal: Pt will repeat rote phrases with ~ 75% speech intelligibility given minimal cues.     LONG TERM GOALS: Target date: 08/26/2021   Pt will use multimodal communication to express basic wants/needs in 4 of 10 opportunities.  Baseline: unable Goal  status: ONGOING   2.  Pt will demonstrate increased reading comprehension by selecting accurate answer from 3 choices in 2 out of 10 opportunities.  Baseline: unable Goal status: ONGOING   3.  Pt will correctly spell his name in 5 out of 10 opportunities.  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 Pt presents with improved reading abilities at the phrase level, spelling continues to be strength with increased independent use writing to aid in communication. Pt with increased islands of intelligible speech with consonant cluster reduction and diagraph difficulties being the most affected. .   OBJECTIVE IMPAIRMENTS include expressive language, receptive language, aphasia, and apraxia. These impairments are limiting patient from managing medications, managing appointments, managing finances, household responsibilities, ADLs/IADLs, and effectively communicating at home and in community. Factors affecting potential to achieve goals and functional outcome are severity of impairments. Patient  will benefit from skilled SLP services to address above impairments and improve overall function.  REHAB POTENTIAL: Excellent  PLAN: SLP FREQUENCY: 3x/week  SLP DURATION: 12 weeks  PLANNED INTERVENTIONS: Language facilitation, Environmental controls, Internal/external aids, Functional tasks, Multimodal communication approach, SLP instruction and feedback, Compensatory strategies, and Patient/family education   Keeleigh Terris B. Rutherford Nail, M.S., CCC-SLP, Mining engineer Certified Brain Injury [place]  [place] Office [phone] Ascom [phone] Fax [phone]

## 2022-07-02 ENCOUNTER — Ambulatory Visit: Payer: Medicare HMO | Admitting: Speech Pathology

## 2022-07-02 ENCOUNTER — Other Ambulatory Visit: Payer: Self-pay | Admitting: Cardiovascular Disease

## 2022-07-02 DIAGNOSIS — R482 Apraxia: Secondary | ICD-10-CM

## 2022-07-02 DIAGNOSIS — R4701 Aphasia: Secondary | ICD-10-CM | POA: Diagnosis not present

## 2022-07-02 DIAGNOSIS — I6602 Occlusion and stenosis of left middle cerebral artery: Secondary | ICD-10-CM

## 2022-07-03 ENCOUNTER — Encounter: Payer: Medicare HMO | Admitting: Speech Pathology

## 2022-07-03 NOTE — Therapy (Signed)
OUTPATIENT SPEECH LANGUAGE PATHOLOGY TREATMENT NOTE   Patient Name: Alan Stewart MRN: [id number] DOB:11-09-1945, 76 y.o., male Today's Date: 07/03/2022    PCP: Ramonita Lab, MD REFERRING PROVIDER: Ramonita Lab, MD   End of Session - 07/03/22 0827     Visit Number 15    Number of Visits 25    Date for SLP Re-Evaluation 08/26/22    Authorization Type [employer] HMO    Progress Note Due on Visit 1    SLP Start Time 1100    SLP Stop Time  1200    SLP Time Calculation (min) 60 min    Activity Tolerance Patient tolerated treatment well                  Past Medical History:  Diagnosis Date   CAD (coronary artery disease)    a. 10/2021 NSTEMI/PCI: LM nl, LAD min irregs, LCX 8m(2.5x18 Onyx Frontier DES), OM1 nl, RCA 972m3.5x26 Onyx Frontier DES), 30d.   Diastolic dysfunction    a. 10/2021 Echo: EF 50-55%, no rwma, Gr1 DD, nl RV fxn. No significant valvular dzs.   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 SCCis. Tx pending   Past Surgical History:  Procedure Laterality Date   COLONOSCOPY N/A 10/09/2015   Procedure: COLONOSCOPY;  Surgeon: PaHulen LusterMD;  [facility];  Service: Gastroenterology;  Laterality: N/A;   CORONARY STENT INTERVENTION N/A 10/22/2021   Procedure: CORONARY STENT INTERVENTION;  Surgeon: ArWellington HampshireMD;  Location: [facility];  Service: Cardiovascular;  Laterality: N/A;   IR CT HEAD LTD  05/21/2022   IR PERCUTANEOUS ART THROMBECTOMY/INFUSION INTRACRANIAL INC DIAG ANGIO  05/21/2022   LEFT HEART CATH AND CORONARY ANGIOGRAPHY N/A 10/22/2021   Procedure: LEFT HEART CATH AND CORONARY ANGIOGRAPHY;  Surgeon: ArWellington HampshireMD;  [facility];  Service: Cardiovascular;  Laterality: N/A;   LOOP  RECORDER INSERTION N/A 05/23/2022   Procedure: LOOP RECORDER INSERTION;  Surgeon: LaVickie EpleyMD;  [facility];  Service: Cardiovascular;  Laterality: N/A;   RADIOLOGY WITH ANESTHESIA N/A 05/20/2022   Procedure: IR WITH ANESTHESIA;  Surgeon: DeLuanne BrasMD;  [facility]: [facility] Service: Radiology;  Laterality: N/A;   ROTATOR CUFF REPAIR     Patient Active Problem List   Diagnosis Date Noted   Stroke (cerebrum) (HCG[place]10/10/2021   Middle cerebral artery embolism, 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 male with PMH: HTN, CAD, HLD, osteoarthritis, SCC, sleep apnea. He presented to the hospital on 05/20/22 with right sided weakness and aphasia with LKW reported by wife to be 10pm on 10/2.  [facility]/CTA which demonstrated left M1 occlusion. He received IV tenecteplase and IR thrombectomy due to large vessel occlusion.  DIAGNOSTIC FINDINGS:  MRI 05/22/2022 Cortical ischemia throughout the left MCA territory compatible with recent infarct. Relatively little affect on the white matter. 2. Small focus of acute hemorrhage along the anterior surface of the left temporal lobe Heidelberg classification 3c: Subarachnoid hemorrhage. 3. Normal intracranial MRA.  Restored flow in left MCA.    THERAPY DIAG:  Aphasia  Apraxia  Middle cerebral artery embolism, left  Rationale for Evaluation and Treatment Rehabilitation  SUBJECTIVE: "Sometimes he becomes frustrated when I tell him to write it down to help me"  Pt accompanied by:  self and significant other  PAIN:  Are you having pain? No  PATIENT GOALS: to be able to communicate again  OBJECTIVE:   TODAY'S TREATMENT: Skilled treatment session focused on pt's communication goals. SLP facilitated session by providing the following interventions:  Pt and his wife report that pt completed supper and breakfast independently with good problem solving and safety awareness  Pt brought in his medicine bottles with SLP providing instruction in use of BID pill organizer. Pt required maximal assistance to understanding use of BID organizer. However he was able to understand request organizer entire week. Pt with increased impulsivity without self-monitoring or double checking. Overall pt able to demonstrate appropriate reading comprehension of all medication labels except the one with directions "1 tablet twice daily."  With initial cue, pt was independent with attempting writing as a compensatory strategy to verbal communication. Pt also independently wrote whole sentences instead of word level only. His sentences composed of 4-5 words. While pt's words were intermittently misspelled, they were effective in representing intended word and were therefore communicative. While there was 1 attempt of communication out of at 6 that pt was not able to convey message, there continued to be increased verbal communication during writing attempt.   Skilled information was provided both verbally and written on deficits by site of lesion as pt expressed confusion as to why he was able to read and write fairly well but unable to speak      PATIENT EDUCATION: Education details: language impairment by site of lesion Person educated: Patient and Wife Education method: Explanation, Demonstration, Verbal cues, and Handouts Education comprehension: verbalized understanding  GOALS: Goals reviewed with patient? Yes  SHORT TERM GOALS: Target date: 10 sessions  UPDATED - 06/24/2022 Given  moderate assistance, pt will answer simple basic yes/no questions with 50% accuracy.  Baseline: 25% Goal status: MET UPDATED goal: Given minimal assistance, pt will answer simple basic yes/no questions with 75% accuracy.    2.  Given moderate assistance, pt will follow simple basic 1 step directions with 50% accuracy.  Baseline: 0% Goal status: MET  UPDATED goal: Given minimal assistance, pt will following simple basic 1 step directions with 75% accuracy.    3.  With maximal multimodal cues, pt will name set of 10 common objects in 8 out of 10 opportunities.  Baseline: unable Goal status: MET UPDATED goal: With moderate cues, pt will name set of 10 common objects in 8 out of 10 opportunities.    4. Pt will improve reading abilities by placing phrase level object function with object in field of 3 with 50% ability and moderate cues.              Baseline: object name to object - 90%             Goal status: MET  UPDATED goal: Pt will demonstrate reading comprehension at the sentence level for basic information with > 75% accuracy  and min A cues.    5. Pt will improve spelling abilities by completing common words by filling in missing letter with 50% ability and moderate cues.              Baseline: unable             Goal status: MET UPDATED goal: Pt will improve spelling abilities by completing common words by filling in missing letters with >90% accuracy and rare min A cues.    6. Pt will repeat rote phrases with 25% speech intelligibility given max to moderate cues.              Baseline: unable             Goal status: MET  UPDATED goal: Pt will repeat rote phrases with ~ 75% speech intelligibility given minimal cues.     LONG TERM GOALS: Target date: 08/26/2021   Pt will use multimodal communication to express basic wants/needs in 4 of 10 opportunities.  Baseline: unable Goal status: ONGOING   2.  Pt will demonstrate increased reading comprehension by selecting accurate  answer from 3 choices in 2 out of 10 opportunities.  Baseline: unable Goal status: ONGOING   3.  Pt will correctly spell his name in 5 out of 10 opportunities.  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 Pt presents with improved reading abilities at the phrase level, spelling continues to be strength with increased independent use writing to aid in communication. Pt with increased islands of intelligible speech  with fewer misarticulated sounds.  OBJECTIVE IMPAIRMENTS include expressive language, receptive language, aphasia, and apraxia. These impairments are limiting patient from managing medications, managing appointments, managing finances, household responsibilities, ADLs/IADLs, and effectively communicating at home and in community. Factors affecting potential to achieve goals and functional outcome are severity of impairments. Patient will benefit from skilled SLP services to address above impairments and improve overall function.  REHAB POTENTIAL: Excellent  PLAN: SLP FREQUENCY: 3x/week  SLP DURATION: 12 weeks  PLANNED INTERVENTIONS: Language facilitation, Environmental controls, Internal/external aids, Functional tasks, Multimodal communication approach, SLP instruction and feedback, Compensatory strategies, and Patient/family education   Happi B. Rutherford Nail, M.S., CCC-SLP, Mining engineer Certified Brain Injury [place]  [place] Office [phone] Ascom [phone] Fax [phone]

## 2022-07-05 ENCOUNTER — Ambulatory Visit: Payer: Medicare HMO | Admitting: Speech Pathology

## 2022-07-05 DIAGNOSIS — I6602 Occlusion and stenosis of left middle cerebral artery: Secondary | ICD-10-CM | POA: Diagnosis not present

## 2022-07-05 DIAGNOSIS — R482 Apraxia: Secondary | ICD-10-CM

## 2022-07-05 DIAGNOSIS — R4701 Aphasia: Secondary | ICD-10-CM | POA: Diagnosis not present

## 2022-07-05 NOTE — Therapy (Signed)
OUTPATIENT SPEECH LANGUAGE PATHOLOGY TREATMENT NOTE   Patient Name: Alan Stewart MRN: [id number] DOB:1945/11/24, 76 y.o., male Today's Date: 07/05/2022     PCP: Ramonita Lab, MD REFERRING PROVIDER: Ramonita Lab, MD  END OF SESSION  End of Session - 07/05/22 1006     Visit Number 16    Number of Visits 25    Date for SLP Re-Evaluation 08/26/22    Authorization Type [employer] H[employer]    Progress Note Due on Visit 31    SLP Start Time 1000    SLP Stop Time  1100    SLP Time Calculation (min) 60 min    Activity Tolerance Patient tolerated treatment well               Past Medical History:  Diagnosis Date   CAD (coronary artery disease)    a. 10/2021 NSTEMI/PCI: LM nl, LAD min irregs, LCX 26m(2.5x18 Onyx Frontier DES), OM1 nl, RCA 931m3.5x26 Onyx Frontier DES), 30d.   Diastolic dysfunction    a. 10/2021 Echo: EF 50-55%, no rwma, Gr1 DD, nl RV fxn. No significant valvular dzs.   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 SCCis. Tx pending   Past Surgical History:  Procedure Laterality Date   COLONOSCOPY N/A 10/09/2015   Procedure: COLONOSCOPY;  Surgeon: PaHulen LusterMD;  [facility];  Service: Gastroenterology;  Laterality: N/A;   CORONARY STENT INTERVENTION N/A 10/22/2021   Procedure: CORONARY STENT INTERVENTION;  Surgeon: ArWellington HampshireMD;  [facility];  Service: Cardiovascular;  Laterality: N/A;   IR CT HEAD LTD  05/21/2022   IR PERCUTANEOUS ART THROMBECTOMY/INFUSION INTRACRANIAL INC DIAG ANGIO  05/21/2022   LEFT HEART CATH AND CORONARY ANGIOGRAPHY N/A 10/22/2021   Procedure: LEFT HEART CATH AND CORONARY ANGIOGRAPHY;  Surgeon: ArWellington HampshireMD;  [facility];  Service: Cardiovascular;  Laterality: N/A;    LOOP RECORDER INSERTION N/A 05/23/2022   Procedure: LOOP RECORDER INSERTION;  Surgeon: LaVickie EpleyMD;  [facility];  Service: Cardiovascular;  Laterality: N/A;   RADIOLOGY WITH ANESTHESIA N/A 05/20/2022   Procedure: IR WITH ANESTHESIA;  Surgeon: DeLuanne BrasMD;  [facility] Service: Radiology;  Laterality: N/A;   ROTATOR CUFF REPAIR     Patient Active Problem List   Diagnosis Date Noted   Stroke (cerebrum) ([facility]10/10/2021   Middle cerebral artery embolism, 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 male with PMH: HTN, CAD, HLD, osteoarthritis, SCC, sleep apnea. He presented to the hospital on 05/20/22 with right sided weakness and aphasia with LKW reported by wife to be 10pm on 10/2.  [facility] emergent CT/CTA which demonstrated left M1 occlusion. He received IV tenecteplase and IR thrombectomy due to large vessel  occlusion.       DIAGNOSTIC FINDINGS:  MRI 05/22/2022 Cortical ischemia throughout the left MCA territory compatible with recent infarct. Relatively little affect on the white matter. 2. Small focus of acute hemorrhage along the anterior surface of the left temporal lobe Heidelberg classification 3c: Subarachnoid hemorrhage. 3. Normal intracranial MRA.  Restored flow in left MCA.    THERAPY DIAG:  Aphasia  Apraxia  Middle cerebral artery embolism, left  Rationale for Evaluation and Treatment Rehabilitation  SUBJECTIVE: "Sometimes he becomes frustrated when I tell him to write it down to help me"  Pt  accompanied by: self and significant other  PAIN:  Are you having pain? No  PATIENT GOALS: to be able to communicate again  OBJECTIVE:   TODAY'S TREATMENT: Skilled treatment session focused on pt's communication goals. SLP facilitated session by providing the following interventions:  Overall pt benefited from increased cues throughout session. Pt was also observed to say rote letters and required cues to condition verbal impulsivity.   With moderate multimodal assistance, pt able to name pictures of his common objects with 75% accuracy. Pt's accuracy with spelling was also ~ 75% with min A cues.         PATIENT EDUCATION: Education details: language impairment by site of lesion Person educated: Patient and Wife Education method: Explanation, Demonstration, Verbal cues, and Handouts Education comprehension: verbalized understanding  GOALS: Goals reviewed with patient? Yes  SHORT TERM GOALS: Target date: 10 sessions  UPDATED - 06/24/2022 Given moderate assistance, pt will answer simple basic yes/no questions with 50% accuracy.  Baseline: 25% Goal status: MET UPDATED goal: Given minimal assistance, pt will answer simple basic yes/no questions with 75% accuracy.    2.  Given moderate assistance, pt will follow simple basic 1 step directions with 50% accuracy.  Baseline: 0% Goal status: MET  UPDATED goal: Given minimal assistance, pt will following simple basic 1 step directions with 75% accuracy.    3.  With maximal multimodal cues, pt will name set of 10 common objects in 8 out of 10 opportunities.  Baseline: unable Goal status: MET UPDATED goal: With moderate cues, pt will name set of 10 common objects in 8 out of 10 opportunities.    4. Pt will improve reading abilities by placing phrase level object function with object in field of 3 with 50% ability and moderate cues.              Baseline: object name to object - 90%             Goal status: MET  UPDATED goal: Pt  will demonstrate reading comprehension at the sentence level for basic information with > 75% accuracy and min A cues.    5. Pt will improve spelling abilities by completing common words by filling in missing letter with 50% ability and moderate cues.              Baseline: unable             Goal status: MET UPDATED goal: Pt will improve spelling abilities by completing common words by filling in missing letters with >90% accuracy and rare min A cues.    6. Pt will repeat rote phrases with 25% speech intelligibility given max to moderate cues.              Baseline: unable             Goal status: MET  UPDATED goal: Pt will repeat rote phrases with ~  75% speech intelligibility given minimal cues.     LONG TERM GOALS: Target date: 08/26/2021   Pt will use multimodal communication to express basic wants/needs in 4 of 10 opportunities.  Baseline: unable Goal status: ONGOING   2.  Pt will demonstrate increased reading comprehension by selecting accurate answer from 3 choices in 2 out of 10 opportunities.  Baseline: unable Goal status: ONGOING   3.  Pt will correctly spell his name in 5 out of 10 opportunities.  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 Pt demonstrates increased islands of intelligible speech.   OBJECTIVE IMPAIRMENTS include expressive language, receptive language, aphasia, and apraxia. These impairments are limiting patient from managing medications, managing appointments, managing finances, household responsibilities, ADLs/IADLs, and effectively communicating at home and in community. Factors affecting potential to achieve goals and functional outcome are severity of impairments. Patient will benefit from skilled SLP services to address above impairments and improve overall  function.  REHAB POTENTIAL: Excellent  PLAN: SLP FREQUENCY: 3x/week  SLP DURATION: 12 weeks  PLANNED INTERVENTIONS: Language facilitation, Environmental controls, Internal/external aids, Functional tasks, Multimodal communication approach, SLP instruction and feedback, Compensatory strategies, and Patient/family education   Tremond Shimabukuro B. Rutherford Nail, M.S., CCC-SLP, Mining engineer Certified Brain Injury [place]  [place] Office [phone] Ascom [phone] Fax [phone]

## 2022-07-08 ENCOUNTER — Ambulatory Visit: Payer: Medicare HMO | Admitting: Speech Pathology

## 2022-07-08 ENCOUNTER — Ambulatory Visit: Payer: Medicare HMO

## 2022-07-08 ENCOUNTER — Encounter: Payer: Medicare HMO | Admitting: Occupational Therapy

## 2022-07-08 DIAGNOSIS — D0461 Carcinoma in situ of skin of right upper limb, including shoulder: Secondary | ICD-10-CM | POA: Diagnosis not present

## 2022-07-09 ENCOUNTER — Ambulatory Visit: Payer: Medicare HMO | Admitting: Speech Pathology

## 2022-07-10 ENCOUNTER — Ambulatory Visit: Payer: Medicare HMO

## 2022-07-10 ENCOUNTER — Ambulatory Visit: Payer: Medicare HMO | Admitting: Speech Pathology

## 2022-07-10 DIAGNOSIS — R482 Apraxia: Secondary | ICD-10-CM | POA: Diagnosis not present

## 2022-07-10 DIAGNOSIS — R4701 Aphasia: Secondary | ICD-10-CM | POA: Diagnosis not present

## 2022-07-10 DIAGNOSIS — I6602 Occlusion and stenosis of left middle cerebral artery: Secondary | ICD-10-CM | POA: Diagnosis not present

## 2022-07-14 NOTE — Therapy (Addendum)
OUTPATIENT SPEECH LANGUAGE PATHOLOGY TREATMENT NOTE   Patient Name: Alan Stewart MRN: [id number] DOB:1945/11/29, 76 y.o., male Today's Date: 07/14/2022     PCP: Ramonita Lab, MD REFERRING PROVIDER: Ramonita Lab, MD  END OF SESSION  End of Session - 07/14/22 2007     Visit Number 17    Number of Visits 25    Date for SLP Re-Evaluation 08/26/22    Authorization Type [employer] HMO    Progress Note Due on Visit 65    SLP Start Time 1000    SLP Stop Time  1100    SLP Time Calculation (min) 60 min    Activity Tolerance Patient tolerated treatment well               Past Medical History:  Diagnosis Date   CAD (coronary artery disease)    a. 10/2021 NSTEMI/PCI: LM nl, LAD min irregs, LCX 68m(2.5x18 Onyx Frontier DES), OM1 nl, RCA 975m3.5x26 Onyx Frontier DES), 30d.   Diastolic dysfunction    a. 10/2021 Echo: EF 50-55%, no rwma, Gr1 DD, nl RV fxn. No significant valvular dzs.   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 SCCis. Tx pending   Past Surgical History:  Procedure Laterality Date   COLONOSCOPY N/A 10/09/2015   Procedure: COLONOSCOPY;  Surgeon: PaHulen LusterMD;  [facility];  Service: Gastroenterology;  Laterality: N/A;   CORONARY STENT INTERVENTION N/A 10/22/2021   Procedure: CORONARY STENT INTERVENTION;  Surgeon: ArWellington HampshireMD;  [facility];  Service: Cardiovascular;  Laterality: N/A;   IR CT HEAD LTD  05/21/2022   IR PERCUTANEOUS ART THROMBECTOMY/INFUSION INTRACRANIAL INC DIAG ANGIO  05/21/2022   LEFT HEART CATH AND CORONARY ANGIOGRAPHY N/A 10/22/2021   Procedure: LEFT HEART CATH AND CORONARY ANGIOGRAPHY;  Surgeon: ArWellington HampshireMD;  [facility];  Service: Cardiovascular;  Laterality: N/A;    LOOP RECORDER INSERTION N/A 05/23/2022   Procedure: LOOP RECORDER INSERTION;  Surgeon: LaVickie EpleyMD;  [facility];  Service: Cardiovascular;  Laterality: N/A;   RADIOLOGY WITH ANESTHESIA N/A 05/20/2022   Procedure: IR WITH ANESTHESIA;  Surgeon: DeLuanne BrasMD;  [facility] Service: Radiology;  Laterality: N/A;   ROTATOR CUFF REPAIR     Patient Active Problem List   Diagnosis Date Noted   Stroke (cerebrum) (HCOak[place]10/10/2021   Middle cerebral artery embolism, 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 male with PMH: HTN, CAD, HLD, osteoarthritis, SCC, sleep apnea. He presented to the hospital on 05/20/22 with right sided weakness and aphasia with LKW reported by wife to be 10pm on 10/2.  [facility]/CTA which demonstrated left M1 occlusion. He received IV tenecteplase and IR thrombectomy due to large vessel  occlusion.       DIAGNOSTIC FINDINGS:  MRI 05/22/2022 Cortical ischemia throughout the left MCA territory compatible with recent infarct. Relatively little affect on the white matter. 2. Small focus of acute hemorrhage along the anterior surface of the left temporal lobe Heidelberg classification 3c: Subarachnoid hemorrhage. 3. Normal intracranial MRA.  Restored flow in left MCA.    THERAPY DIAG:  Aphasia  Apraxia  Middle cerebral artery embolism, left  Rationale for Evaluation and Treatment Rehabilitation  SUBJECTIVE: Pt had day surgery on his right index finger and missed sessions earlier in the week  Pt  accompanied by: self and significant other  PAIN:  Are you having pain? No  PATIENT GOALS: to be able to communicate again  OBJECTIVE:   TODAY'S TREATMENT: Skilled treatment session focused on pt's communication goals. SLP facilitated session by providing the following interventions:  Pt with increased awareness of rote utterances "r/s/t" and stopped as soon as he uttered. Pt also with increased independent initiation to use written cues.  Targeted pt's pictures of basic objects such as watch, shirt, shoes, fork, spoon, plate. SLP made functional language sentence completion and word scrambles targeting each picture.  Pt benefited from maximal to moderate assistance for spelling Thanksgiving based words.        PATIENT EDUCATION: Education details: language impairment by site of lesion Person educated: Patient and Wife Education method: Explanation, Media planner, Verbal cues, and Handouts Education comprehension: verbalized understanding  HOME EXERCISE PROGRAM:  Complete worksheets made by SLP targeting basic objects  GOALS: Goals reviewed with patient? Yes  SHORT TERM GOALS: Target date: 10 sessions  UPDATED - 06/24/2022 Given moderate assistance, pt will answer simple basic yes/no questions with 50% accuracy.  Baseline: 25% Goal status: MET UPDATED goal: Given minimal assistance, pt will answer simple basic yes/no questions with 75% accuracy.    2.  Given moderate assistance, pt will follow simple basic 1 step directions with 50% accuracy.  Baseline: 0% Goal status: MET  UPDATED goal: Given minimal assistance, pt will following simple basic 1 step directions with 75% accuracy.    3.  With maximal multimodal cues, pt will name set of 10 common objects in 8 out of 10 opportunities.  Baseline: unable Goal status: MET UPDATED goal: With moderate cues, pt will name set of 10 common objects in 8 out of 10 opportunities.    4. Pt will improve reading abilities by placing  phrase level object function with object in field of 3 with 50% ability and moderate cues.              Baseline: object name to object - 90%             Goal status: MET  UPDATED goal: Pt will demonstrate reading comprehension at the sentence level for basic information with > 75% accuracy and min A cues.    5. Pt will improve spelling abilities by completing common words by filling in missing letter with 50% ability and moderate cues.              Baseline: unable             Goal status: MET UPDATED goal: Pt will improve spelling abilities by completing common words by filling in missing letters with >90% accuracy and rare min A cues.    6. Pt will repeat rote phrases with 25% speech intelligibility given max to moderate cues.              Baseline:  unable             Goal status: MET  UPDATED goal: Pt will repeat rote phrases with ~ 75% speech intelligibility given minimal cues.     LONG TERM GOALS: Target date: 08/26/2021   Pt will use multimodal communication to express basic wants/needs in 4 of 10 opportunities.  Baseline: unable Goal status: ONGOING   2.  Pt will demonstrate increased reading comprehension by selecting accurate answer from 3 choices in 2 out of 10 opportunities.  Baseline: unable Goal status: ONGOING   3.  Pt will correctly spell his name in 5 out of 10 opportunities.  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 Pt demonstrates increased islands of intelligible speech as well as independent use of writing to help during moments of communication breakdowns.   OBJECTIVE IMPAIRMENTS include expressive language, receptive language, aphasia, and apraxia. These impairments are limiting patient from managing medications, managing appointments, managing finances, household  responsibilities, ADLs/IADLs, and effectively communicating at home and in community. Factors affecting potential to achieve goals and functional outcome are severity of impairments. Patient will benefit from skilled SLP services to address above impairments and improve overall function.  REHAB POTENTIAL: Excellent  PLAN: SLP FREQUENCY: 3x/week  SLP DURATION: 12 weeks  PLANNED INTERVENTIONS: Language facilitation, Environmental controls, Internal/external aids, Functional tasks, Multimodal communication approach, SLP instruction and feedback, Compensatory strategies, and Patient/family education   Kaaren Nass B. Rutherford Nail, M.S., CCC-SLP, Mining engineer Certified Brain Injury [place]  [place] Office [phone] Ascom [phone] Fax [phone]

## 2022-07-15 ENCOUNTER — Ambulatory Visit: Payer: Medicare HMO

## 2022-07-15 ENCOUNTER — Encounter: Payer: Medicare HMO | Admitting: Occupational Therapy

## 2022-07-15 ENCOUNTER — Ambulatory Visit: Payer: Medicare HMO | Admitting: Speech Pathology

## 2022-07-15 DIAGNOSIS — I6602 Occlusion and stenosis of left middle cerebral artery: Secondary | ICD-10-CM | POA: Diagnosis not present

## 2022-07-15 DIAGNOSIS — R482 Apraxia: Secondary | ICD-10-CM

## 2022-07-15 DIAGNOSIS — R4701 Aphasia: Secondary | ICD-10-CM | POA: Diagnosis not present

## 2022-07-17 ENCOUNTER — Ambulatory Visit: Payer: Medicare HMO

## 2022-07-17 ENCOUNTER — Ambulatory Visit: Payer: Medicare HMO | Admitting: Speech Pathology

## 2022-07-17 DIAGNOSIS — I6602 Occlusion and stenosis of left middle cerebral artery: Secondary | ICD-10-CM

## 2022-07-17 DIAGNOSIS — R4701 Aphasia: Secondary | ICD-10-CM

## 2022-07-17 DIAGNOSIS — R482 Apraxia: Secondary | ICD-10-CM | POA: Diagnosis not present

## 2022-07-17 NOTE — Therapy (Signed)
OUTPATIENT SPEECH LANGUAGE PATHOLOGY TREATMENT NOTE   Patient Name: Alan Stewart MRN: [id number] DOB:1946-08-17, 76 y.o., male Today's Date: 07/17/2022     PCP: Ramonita Lab, MD REFERRING PROVIDER: Ramonita Lab, MD  END OF SESSION  End of Session - 07/17/22 1325     Visit Number 18    Number of Visits 25    Date for SLP Re-Evaluation 08/26/22    Authorization Type [employer] HMO    Progress Note Due on Visit 54    SLP Start Time 5    SLP Stop Time  1500    SLP Time Calculation (min) 60 min    Activity Tolerance Patient tolerated treatment well               Past Medical History:  Diagnosis Date   CAD (coronary artery disease)    a. 10/2021 NSTEMI/PCI: LM nl, LAD min irregs, LCX 57m(2.5x18 Onyx Frontier DES), OM1 nl, RCA 963m3.5x26 Onyx Frontier DES), 30d.   Diastolic dysfunction    a. 10/2021 Echo: EF 50-55%, no rwma, Gr1 DD, nl RV fxn. No significant valvular dzs.   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 SCCis. Tx pending   Past Surgical History:  Procedure Laterality Date   COLONOSCOPY N/A 10/09/2015   Procedure: COLONOSCOPY;  Surgeon: PaHulen LusterMD;  [facility];  Service: Gastroenterology;  Laterality: N/A;   CORONARY STENT INTERVENTION N/A 10/22/2021   Procedure: CORONARY STENT INTERVENTION;  Surgeon: ArWellington HampshireMD;  [facility];  Service: Cardiovascular;  Laterality: N/A;   IR CT HEAD LTD  05/21/2022   IR PERCUTANEOUS ART THROMBECTOMY/INFUSION INTRACRANIAL INC DIAG ANGIO  05/21/2022   LEFT HEART CATH AND CORONARY ANGIOGRAPHY N/A 10/22/2021   Procedure: LEFT HEART CATH AND CORONARY ANGIOGRAPHY;  Surgeon: ArWellington HampshireMD;  [facility];  Service: Cardiovascular;  Laterality: N/A;    LOOP RECORDER INSERTION N/A 05/23/2022   Procedure: LOOP RECORDER INSERTION;  Surgeon: LaVickie EpleyMD;  [facility];  Service: Cardiovascular;  Laterality: N/A;   RADIOLOGY WITH ANESTHESIA N/A 05/20/2022   Procedure: IR WITH ANESTHESIA;  Surgeon: DeLuanne BrasMD;  [facility]: [facility] Service: Radiology;  Laterality: N/A;   ROTATOR CUFF REPAIR     Patient Active Problem List   Diagnosis Date Noted   Stroke (cerebrum) (H[place]10/10/2021   Middle cerebral artery embolism, 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 male with PMH: HTN, CAD, HLD, osteoarthritis, SCC, sleep apnea. He presented to the hospital on 05/20/22 with right sided weakness and aphasia with LKW reported by wife to be 10pm on 10/2.  [facility]/CTA which demonstrated left M1 occlusion. He received IV tenecteplase and IR thrombectomy due to large vessel  occlusion.       DIAGNOSTIC FINDINGS:  MRI 05/22/2022 Cortical ischemia throughout the left MCA territory compatible with recent infarct. Relatively little affect on the white matter. 2. Small focus of acute hemorrhage along the anterior surface of the left temporal lobe Heidelberg classification 3c: Subarachnoid hemorrhage. 3. Normal intracranial MRA.  Restored flow in left MCA.    THERAPY DIAG:  Aphasia  Apraxia  Middle cerebral artery embolism, left  Rationale for Evaluation and Treatment Rehabilitation  SUBJECTIVE: pt and his wife reporting on Thanksgiving activities  Pt accompanied by: self and  significant other  PAIN:  Are you having pain? No  PATIENT GOALS: to be able to communicate again  OBJECTIVE:   TODAY'S TREATMENT: Skilled treatment session focused on pt's communication goals. SLP facilitated session by providing the following interventions:  Pt benefited from increased assistance (visual, verbal and tactile) throughout entire session d/t perseveration. Pt's wife reported that pt struggled with homework worksheets targeting basic objects related to pt. SLP requested wife and pt engaged in task as if at home to possibly problem solve difficulty. Despite efforts, pt continued with verba perseveration that was responsive to cues.        PATIENT EDUCATION: Education details: variableness of recovery Person educated: Patient and Wife Education method: Explanation, Demonstration, Verbal cues, and Handouts Education comprehension: verbalized understanding  HOME EXERCISE PROGRAM:  Complete worksheets made by SLP targeting basic objects  GOALS: Goals reviewed with patient? Yes  SHORT TERM GOALS: Target date: 10 sessions  UPDATED - 06/24/2022 Given moderate assistance, pt will answer simple basic yes/no questions with 50% accuracy.  Baseline: 25% Goal status: MET UPDATED goal: Given minimal assistance, pt will answer simple basic yes/no questions with 75% accuracy.    2.  Given moderate assistance, pt will follow simple basic 1 step directions with 50% accuracy.  Baseline: 0% Goal status: MET  UPDATED goal: Given minimal assistance, pt will following simple basic 1 step directions with 75% accuracy.    3.  With maximal multimodal cues, pt will name set of 10 common objects in 8 out of 10 opportunities.  Baseline: unable Goal status: MET UPDATED goal: With moderate cues, pt will name set of 10 common objects in 8 out of 10 opportunities.    4. Pt will improve reading abilities by placing phrase level object function with object in field of 3 with 50% ability and  moderate cues.              Baseline: object name to object - 90%             Goal status: MET  UPDATED goal: Pt will demonstrate reading comprehension at the sentence level for basic information with > 75% accuracy and min A cues.    5. Pt will improve spelling abilities by completing common words by filling in missing letter with 50% ability and moderate cues.              Baseline: unable             Goal status: MET UPDATED goal: Pt will improve spelling abilities by completing common words by filling in missing letters with >90% accuracy and rare min A cues.    6. Pt will repeat rote phrases with 25% speech intelligibility given max to moderate cues.              Baseline: unable             Goal status: MET  UPDATED  goal: Pt will repeat rote phrases with ~ 75% speech intelligibility given minimal cues.     LONG TERM GOALS: Target date: 08/26/2021   Pt will use multimodal communication to express basic wants/needs in 4 of 10 opportunities.  Baseline: unable Goal status: ONGOING   2.  Pt will demonstrate increased reading comprehension by selecting accurate answer from 3 choices in 2 out of 10 opportunities.  Baseline: unable Goal status: ONGOING   3.  Pt will correctly spell his name in 5 out of 10 opportunities.  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 Pt demonstrates increased islands of intelligible speech as well as independent use of writing to help during moments of communication breakdowns.   OBJECTIVE IMPAIRMENTS include expressive language, receptive language, aphasia, and apraxia. These impairments are limiting patient from managing medications, managing appointments, managing finances, household responsibilities, ADLs/IADLs, and effectively communicating at home and in  community. Factors affecting potential to achieve goals and functional outcome are severity of impairments. Patient will benefit from skilled SLP services to address above impairments and improve overall function.  REHAB POTENTIAL: Excellent  PLAN: SLP FREQUENCY: 3x/week  SLP DURATION: 12 weeks  PLANNED INTERVENTIONS: Language facilitation, Environmental controls, Internal/external aids, Functional tasks, Multimodal communication approach, SLP instruction and feedback, Compensatory strategies, and Patient/family education   Barnett Elzey B. Rutherford Nail, M.S., CCC-SLP, Mining engineer Certified Brain Injury [place]  [place] Office [phone] Ascom [phone] Fax [phone]

## 2022-07-18 NOTE — Therapy (Addendum)
OUTPATIENT SPEECH LANGUAGE PATHOLOGY TREATMENT NOTE   Patient Name: Alan Stewart MRN: [id number] DOB:1946/02/24, 76 y.o., male Today's Date: 07/18/2022     PCP: Ramonita Lab, MD REFERRING PROVIDER: Ramonita Lab, MD  END OF SESSION  End of Session - 07/18/22 1257     Visit Number 19    Number of Visits 25    Date for SLP Re-Evaluation 08/26/22    Authorization Type [employer] HMO    Progress Note Due on Visit 64    SLP Start Time 1000    SLP Stop Time  1100    SLP Time Calculation (min) 60 min    Activity Tolerance Patient tolerated treatment well               Past Medical History:  Diagnosis Date   CAD (coronary artery disease)    a. 10/2021 NSTEMI/PCI: LM nl, LAD min irregs, LCX 30m(2.5x18 Onyx Frontier DES), OM1 nl, RCA 948m3.5x26 Onyx Frontier DES), 30d.   Diastolic dysfunction    a. 10/2021 Echo: EF 50-55%, no rwma, Gr1 DD, nl RV fxn. No significant valvular dzs.   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 SCCis. Tx pending   Past Surgical History:  Procedure Laterality Date   COLONOSCOPY N/A 10/09/2015   Procedure: COLONOSCOPY;  Surgeon: PaHulen LusterMD;  [facility];  Service: Gastroenterology;  Laterality: N/A;   CORONARY STENT INTERVENTION N/A 10/22/2021   Procedure: CORONARY STENT INTERVENTION;  Surgeon: ArWellington HampshireMD;  [facility];  Service: Cardiovascular;  Laterality: N/A;   IR CT HEAD LTD  05/21/2022   IR PERCUTANEOUS ART THROMBECTOMY/INFUSION INTRACRANIAL INC DIAG ANGIO  05/21/2022   LEFT HEART CATH AND CORONARY ANGIOGRAPHY N/A 10/22/2021   Procedure: LEFT HEART CATH AND CORONARY ANGIOGRAPHY;  Surgeon: ArWellington HampshireMD;  [facility];  Service: Cardiovascular;  Laterality: N/A;    LOOP RECORDER INSERTION N/A 05/23/2022   Procedure: LOOP RECORDER INSERTION;  Surgeon: LaVickie EpleyMD;  [facility];  Service: Cardiovascular;  Laterality: N/A;   RADIOLOGY WITH ANESTHESIA N/A 05/20/2022   Procedure: IR WITH ANESTHESIA;  Surgeon: DeLuanne BrasMD;  [facility] Service: Radiology;  Laterality: N/A;   ROTATOR CUFF REPAIR     Patient Active Problem List   Diagnosis Date Noted   Stroke (cerebrum) (HCSil[place]10/10/2021   Middle cerebral artery embolism, 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 male with PMH: HTN, CAD, HLD, osteoarthritis, SCC, sleep apnea. He presented to the hospital on 05/20/22 with right sided weakness and aphasia with LKW reported by wife to be 10pm on 10/2.  [facility]/CTA which demonstrated left M1 occlusion. He received IV tenecteplase and IR thrombectomy due to large vessel  occlusion.       DIAGNOSTIC FINDINGS:  MRI 05/22/2022 Cortical ischemia throughout the left MCA territory compatible with recent infarct. Relatively little affect on the white matter. 2. Small focus of acute hemorrhage along the anterior surface of the left temporal lobe Heidelberg classification 3c: Subarachnoid hemorrhage. 3. Normal intracranial MRA.  Restored flow in left MCA.    THERAPY DIAG:  Aphasia  Apraxia  Middle cerebral artery embolism, left  Rationale for Evaluation and Treatment Rehabilitation  SUBJECTIVE: pt and his wife reporting on Thanksgiving activities  Pt accompanied by: self and  significant other  PAIN:  Are you having pain? No  PATIENT GOALS: to be able to communicate again  OBJECTIVE:   TODAY'S TREATMENT: Skilled treatment session focused on pt's communication goals. SLP facilitated session by providing the following interventions:  SLP facilitated expressive naming thru use of writing. When pt cued to write down name of basic objects (plate, fork, spoon, watch, cup, shirt, socks, shoes), he was able to spell each correctly (100%) with improved naming abilities in 9 out of 10 opportunities.SLP facilitated expressive naming thru use of writing. When pt cued to write down name of basic objects (plate, fork, spoon, watch, cup, shirt, socks, shoes), he was able to spell each correctly (100%) with improved naming abilities in 9 out of 10 opportunities.        PATIENT EDUCATION: Education details: variableness of recovery Person educated: Patient and Wife Education method: Explanation, Demonstration, Verbal cues, and Handouts Education comprehension: verbalized understanding  HOME EXERCISE PROGRAM:  Complete worksheets made by SLP targeting basic objects  GOALS: Goals reviewed with patient? Yes  SHORT TERM GOALS: Target date: 10 sessions  UPDATED - 06/24/2022 Given moderate assistance, pt will answer simple basic yes/no questions with 50% accuracy.  Baseline: 25% Goal status: MET UPDATED goal: Given minimal assistance, pt will answer simple basic yes/no questions with 75% accuracy.    2.  Given moderate assistance, pt will follow simple basic 1 step directions with 50% accuracy.  Baseline: 0% Goal status: MET  UPDATED goal: Given minimal assistance, pt will following simple basic 1 step directions with 75% accuracy.    3.  With maximal multimodal cues, pt will name set of 10 common objects in 8 out of 10 opportunities.  Baseline: unable Goal status: MET UPDATED goal: With moderate cues, pt will name set of 10 common objects in 8 out of 10  opportunities.    4. Pt will improve reading abilities by placing phrase level object function with object in field of 3 with 50% ability and moderate cues.              Baseline: object name to object - 90%             Goal status: MET  UPDATED goal: Pt will demonstrate reading comprehension at the sentence level for basic information with > 75% accuracy and min A cues.    5. Pt will improve spelling abilities by completing common words by filling in missing letter with 50% ability and moderate cues.              Baseline: unable             Goal status: MET UPDATED goal: Pt will improve spelling abilities by completing common words by filling in missing letters with >90% accuracy and rare min A cues.    6. Pt will repeat rote phrases with 25% speech intelligibility given max to moderate cues.  Baseline: unable             Goal status: MET  UPDATED goal: Pt will repeat rote phrases with ~ 75% speech intelligibility given minimal cues.     LONG TERM GOALS: Target date: 08/26/2021   Pt will use multimodal communication to express basic wants/needs in 4 of 10 opportunities.  Baseline: unable Goal status: ONGOING   2.  Pt will demonstrate increased reading comprehension by selecting accurate answer from 3 choices in 2 out of 10 opportunities.  Baseline: unable Goal status: ONGOING   3.  Pt will correctly spell his name in 5 out of 10 opportunities.  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 Pt demonstrates increased islands of intelligible speech as well as independent use of writing to help during moments of communication breakdowns.   OBJECTIVE IMPAIRMENTS include expressive language, receptive language, aphasia, and apraxia. These impairments are limiting patient from managing  medications, managing appointments, managing finances, household responsibilities, ADLs/IADLs, and effectively communicating at home and in community. Factors affecting potential to achieve goals and functional outcome are severity of impairments. Patient will benefit from skilled SLP services to address above impairments and improve overall function.  REHAB POTENTIAL: Excellent  PLAN: SLP FREQUENCY: 3x/week  SLP DURATION: 12 weeks  PLANNED INTERVENTIONS: Language facilitation, Environmental controls, Internal/external aids, Functional tasks, Multimodal communication approach, SLP instruction and feedback, Compensatory strategies, and Patient/family education   Sheehan Stacey B. Rutherford Nail, M.S., CCC-SLP, Mining engineer Certified Brain Injury [place]  [place] Office [phone] Ascom [phone] Fax [phone]

## 2022-07-19 ENCOUNTER — Ambulatory Visit: Payer: Medicare HMO | Attending: Internal Medicine | Admitting: Speech Pathology

## 2022-07-19 DIAGNOSIS — I6602 Occlusion and stenosis of left middle cerebral artery: Secondary | ICD-10-CM | POA: Insufficient documentation

## 2022-07-19 DIAGNOSIS — R4701 Aphasia: Secondary | ICD-10-CM | POA: Diagnosis not present

## 2022-07-19 DIAGNOSIS — R482 Apraxia: Secondary | ICD-10-CM | POA: Diagnosis not present

## 2022-07-22 ENCOUNTER — Ambulatory Visit: Payer: Medicare HMO | Admitting: Speech Pathology

## 2022-07-22 ENCOUNTER — Ambulatory Visit: Payer: Medicare HMO

## 2022-07-23 ENCOUNTER — Ambulatory Visit: Payer: Medicare HMO | Admitting: Speech Pathology

## 2022-07-23 DIAGNOSIS — R4701 Aphasia: Secondary | ICD-10-CM

## 2022-07-23 DIAGNOSIS — R482 Apraxia: Secondary | ICD-10-CM | POA: Diagnosis not present

## 2022-07-23 DIAGNOSIS — I6602 Occlusion and stenosis of left middle cerebral artery: Secondary | ICD-10-CM | POA: Diagnosis not present

## 2022-07-23 NOTE — Therapy (Addendum)
OUTPATIENT SPEECH LANGUAGE PATHOLOGY TREATMENT NOTE  10th VISIT PROGRESS NOTE   Patient Name: Alan Stewart MRN: [id number] DOB:[birth year], 76 y.o., male Today's Date: 07/23/2022     PCP: Ramonita Lab, MD REFERRING PROVIDER: Ramonita Lab, MD  Speech Therapy Progress Note  Dates of Reporting Period: 06/24/2022 to 07/19/2022  Objective: Patient has been seen for 10 speech therapy sessions this reporting period targeting his severe expressive language impairment. Patient is making progress toward LTGs and met 4 of 6 STGs this reporting period. See skilled intervention, clinical impressions, and goals below for details.   END OF SESSION  End of Session - 07/23/22 1139     Visit Number 20    Number of Visits 25    Date for SLP Re-Evaluation 08/26/22    Authorization Type [employer] HMO    Progress Note Due on Visit 42    SLP Start Time 1100    SLP Stop Time  1200    SLP Time Calculation (min) 60 min    Activity Tolerance Patient tolerated treatment well               Past Medical History:  Diagnosis Date   CAD (coronary artery disease)    a. 10/2021 NSTEMI/PCI: LM nl, LAD min irregs, LCX 5m(2.5x18 Onyx Frontier DES), OM1 nl, RCA 973m3.5x26 Onyx Frontier DES), 30d.   Diastolic dysfunction    a. 10/2021 Echo: EF 50-55%, no rwma, Gr1 DD, nl RV fxn. No significant valvular dzs.   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 SCCis. Tx pending   Past Surgical History:  Procedure Laterality Date   COLONOSCOPY N/A 10/09/2015   Procedure: COLONOSCOPY;  Surgeon: PaHulen LusterMD;  [facility];  Service: Gastroenterology;  Laterality: N/A;   CORONARY STENT INTERVENTION N/A 10/22/2021   Procedure: CORONARY STENT INTERVENTION;  Surgeon: ArWellington HampshireMD;   [place];  Service: Cardiovascular;  Laterality: N/A;   IR CT HEAD LTD  05/21/2022   IR PERCUTANEOUS ART THROMBECTOMY/INFUSION INTRACRANIAL INC DIAG ANGIO  05/21/2022   LEFT HEART CATH AND CORONARY ANGIOGRAPHY N/A 10/22/2021   Procedure: LEFT HEART CATH AND CORONARY ANGIOGRAPHY;  Surgeon: ArWellington HampshireMD;  [facility];  Service: Cardiovascular;  Laterality: N/A;   LOOP RECORDER INSERTION N/A 05/23/2022   Procedure: LOOP RECORDER INSERTION;  Surgeon: LaVickie EpleyMD;  [place] [facility] [facility];  Service: Cardiovascular;  Laterality: N/A;   RADIOLOGY WITH ANESTHESIA N/A 05/20/2022   Procedure: IR WITH ANESTHESIA;  Surgeon: DeLuanne BrasMD;  [place] Service: Radiology;  Laterality: N/A;   ROTATOR CUFF REPAIR     Patient Active Problem List   Diagnosis Date Noted   Stroke (cerebrum) (HCStrathmore10/10/2021   Middle cerebral artery embolism, 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 male with PMH: HTN, CAD, HLD, osteoarthritis, SCC, sleep apnea. He presented to the hospital on 05/20/22 with right sided weakness and aphasia with LKW reported by wife to be 10pm on 10/2.  He arrived at [facility] as code stroke and taken for emergent CT/CTA which demonstrated left M1 occlusion. He received IV tenecteplase and IR thrombectomy due to large vessel occlusion.       DIAGNOSTIC FINDINGS:  MRI 05/22/2022 Cortical ischemia throughout the left MCA territory compatible with recent infarct. Relatively little affect on the white matter. 2. Small focus of acute  hemorrhage along the anterior surface of the left temporal lobe Heidelberg classification 3c: Subarachnoid hemorrhage. 3. Normal intracranial MRA.  Restored flow in left MCA.    THERAPY DIAG:  Aphasia  Middle cerebral artery embolism, left  Rationale for Evaluation and Treatment Rehabilitation  SUBJECTIVE: pt used his nonverbals to indicate that his wife was not a good driver on their way to the session  Pt accompanied by: self and significant other  PAIN:  Are you having pain? No  PATIENT GOALS: to be able to communicate again  OBJECTIVE:   TODAY'S TREATMENT: Skilled treatment session focused on pt's communication goals. SLP facilitated session by providing the following interventions:   SLP also targeted rote phrases: GOOD MORNING, HOW ARE YOU and MERRY CHRISTMAS - With moderate faded to rare min A pt able to complete intelligibly. Pt indicated that he was not aware that he was not greeting people. SLP provided skilled instruction to pt's wife on ways to model and hopefully elicit these phrases from pt during everyday opportunities.        PATIENT EDUCATION: Education details: emergent awareness Person educated: Patient and Wife Education method: Explanation, Demonstration, Verbal cues, and Handouts Education comprehension: verbalized understanding  HOME EXERCISE PROGRAM:  Complete worksheets made by SLP targeting basic objects  GOALS: Goals reviewed with patient? Yes  SHORT TERM GOALS: Target date: 10 sessions  UPDATED - 06/24/2022  UPDATED - 07/19/2022 Given moderate assistance, pt will answer simple basic yes/no questions with 50% accuracy.  Baseline: 25% Goal status: MET UPDATED goal: Given minimal assistance, pt will answer simple basic yes/no questions with 75% accuracy.  Goal status: MET   2.  Given moderate assistance, pt will follow simple basic 1 step directions with 50% accuracy.  Baseline: 0% Goal status: MET  UPDATED goal: Given minimal  assistance, pt will following simple basic 1 step directions with 75% accuracy.   Goal status: MET   3.  With maximal multimodal cues, pt will name set of 10 common objects in 8 out of 10 opportunities.  Baseline: unable Goal status: MET UPDATED goal: With moderate cues, pt will name set of 10 common objects in 8 out of 10 opportunities.  Goal status: MET UPDATED goal: With minimal cues, pt will name set of 10 common objects in 8 out of 10 opportunities.    4. Pt will improve reading abilities by placing phrase level object function with object in field of 3 with 50% ability and moderate cues.              Baseline: object name to object - 90%             Goal status: MET  UPDATED goal: Pt will demonstrate reading comprehension at the sentence level for basic information with > 75% accuracy and min A cues.   Goal status: ONGOING   5. Pt will improve spelling abilities by completing common words by  filling in missing letter with 50% ability and moderate cues.              Baseline: unable             Goal status: MET UPDATED goal: Pt will improve spelling abilities by completing common words by filling in missing letters with >90% accuracy and rare min A cues.  Goal status: MET UPDATED goal: Pt will improve spelling ability by writing names of basic objects with supervision level cues in 8 out of 10 opportunities.    6. Pt will repeat rote phrases with 25% speech intelligibility given max to moderate cues.              Baseline: unable             Goal status: MET  UPDATED goal: Pt will repeat rote phrases with ~ 75% speech intelligibility given minimal cues.   Goal status: ONGOING    LONG TERM GOALS: Target date: 08/26/2021   Pt will use multimodal communication to express basic wants/needs in 4 of 10 opportunities.  Baseline: unable Goal status: ONGOING   2.  Pt will demonstrate increased reading comprehension by selecting accurate answer from 3 choices in 2 out of 10  opportunities.  Baseline: unable Goal status: ONGOING   3.  Pt will correctly spell his name in 5 out of 10 opportunities.  Baseline: unable Goal status: ONGOING  ASSESSMENT:  CLINICAL IMPRESSION: Pt continues to struggle with emergent awareness and benefits from awareness activities as well as repetitive production of target words d/t motor speech deficits.    OBJECTIVE IMPAIRMENTS include expressive language, receptive language, aphasia, and apraxia. These impairments are limiting patient from managing medications, managing appointments, managing finances, household responsibilities, ADLs/IADLs, and effectively communicating at home and in community. Factors affecting potential to achieve goals and functional outcome are severity of impairments. Patient will benefit from skilled SLP services to address above impairments and improve overall function.  REHAB POTENTIAL: Excellent  PLAN: SLP FREQUENCY: 3x/week  SLP DURATION: 12 weeks  PLANNED INTERVENTIONS: Language facilitation, Environmental controls, Internal/external aids, Functional tasks, Multimodal communication approach, SLP instruction and feedback, Compensatory strategies, and Patient/family education   Merrik Puebla B. Rutherford Nail, M.S., CCC-SLP, Mining engineer Certified Brain Injury [place]  [place] Office [phone] Ascom [phone] Fax [phone]

## 2022-07-23 NOTE — Therapy (Signed)
OUTPATIENT SPEECH LANGUAGE PATHOLOGY TREATMENT NOTE   Patient Name: Alan Stewart MRN: [id number] DOB:1945/11/28, 76 y.o., male Today's Date: 07/23/2022     PCP: Ramonita Lab, MD REFERRING PROVIDER: Ramonita Lab, MD   END OF SESSION  End of Session - 07/23/22 1303     Visit Number 21    Number of Visits 25    Date for SLP Re-Evaluation 08/26/22    Authorization Type [employer] HMO    Progress Note Due on Visit 19    SLP Start Time 24    SLP Stop Time  1400    SLP Time Calculation (min) 60 min    Activity Tolerance Patient tolerated treatment well               Past Medical History:  Diagnosis Date   CAD (coronary artery disease)    a. 10/2021 NSTEMI/PCI: LM nl, LAD min irregs, LCX 71m(2.5x18 Onyx Frontier DES), OM1 nl, RCA 954m3.5x26 Onyx Frontier DES), 30d.   Diastolic dysfunction    a. 10/2021 Echo: EF 50-55%, no rwma, Gr1 DD, nl RV fxn. No significant valvular dzs.   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 SCCis. Tx pending   Past Surgical History:  Procedure Laterality Date   COLONOSCOPY N/A 10/09/2015   Procedure: COLONOSCOPY;  Surgeon: PaHulen LusterMD;  [facility];  Service: Gastroenterology;  Laterality: N/A;   CORONARY STENT INTERVENTION N/A 10/22/2021   Procedure: CORONARY STENT INTERVENTION;  Surgeon: ArWellington HampshireMD;  [place];  Service: Cardiovascular;  Laterality: N/A;   IR CT HEAD LTD  05/21/2022   IR PERCUTANEOUS ART THROMBECTOMY/INFUSION INTRACRANIAL INC DIAG ANGIO  05/21/2022   LEFT HEART CATH AND CORONARY ANGIOGRAPHY N/A 10/22/2021   Procedure: LEFT HEART CATH AND CORONARY ANGIOGRAPHY;  Surgeon: ArWellington HampshireMD;  [place];  Service: Cardiovascular;  Laterality: N/A;    LOOP RECORDER INSERTION N/A 05/23/2022   Procedure: LOOP RECORDER INSERTION;  Surgeon: LaVickie EpleyMD;  [place];  Service: Cardiovascular;  Laterality: N/A;   RADIOLOGY WITH ANESTHESIA N/A 05/20/2022   Procedure: IR WITH ANESTHESIA;  Surgeon: DeLuanne BrasMD;  [place] [facility] [place] Service: Radiology;  Laterality: N/A;   ROTATOR CUFF REPAIR     Patient Active Problem List   Diagnosis Date Noted   Stroke (cerebrum) (HCBridgeton10/10/2021   Middle cerebral artery embolism, 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 male with PMH: HTN, CAD, HLD, osteoarthritis, SCC, sleep apnea. He presented to the hospital on 05/20/22 with right sided weakness and aphasia with LKW reported by wife to be 10pm on 10/2.  [facility] for emergent CT/CTA which demonstrated left M1 occlusion. He received IV tenecteplase and IR thrombectomy due to large  vessel occlusion.       DIAGNOSTIC FINDINGS:  MRI 05/22/2022 Cortical ischemia throughout the left MCA territory compatible with recent infarct. Relatively little affect on the white matter. 2. Small focus of acute hemorrhage along the anterior surface of the left temporal lobe Heidelberg classification 3c: Subarachnoid hemorrhage. 3. Normal intracranial MRA.  Restored flow in left MCA.    THERAPY DIAG:  Aphasia  Apraxia  Middle cerebral artery embolism, left  Rationale for Evaluation and Treatment Rehabilitation  SUBJECTIVE: Pt's wife reports that pt has refused to write down information to help during  communication breakdowns  Pt accompanied by: self and significant other  PAIN:  Are you having pain? No  PATIENT GOALS: to be able to communicate again  OBJECTIVE:   TODAY'S TREATMENT: Skilled treatment session focused on pt's communication goals. SLP facilitated session by providing the following interventions:  Pt's wife voiced frustration with pt because when she asks him to write information down, he has been refusing to do so. Instead pt continues to repeat "don't you remember?" Recommend wife write down what pt is saying to build awareness and willingness to use strategies.    Pt able to name targeted set of common objects in 3 out of 5 objects with written word helpful in naming final 2 objects.   [facility]: Aphasia Re[facility]; Unit 1- Matching and indentification pp 37-39 - sentence level description of black and white line drawing of object - 90%  [facility] Aphasia [facility]: Unit 4 - Answering questions - targeting semi-complex/abstract yes/no questions 90% accuracy  Pt with questions about spelling (communicating via gestures and writing as well as SLP knowing context) -   To informally target spelling, pt was given a semi-complex word scramble using seasonal vocabulary       PATIENT EDUCATION: Education details: emergent awareness Person educated: Patient and Wife Education method: Explanation, Demonstration, Verbal cues, and Handouts Education comprehension: verbalized understanding  HOME EXERCISE PROGRAM:  Complete worksheets made by SLP targeting basic objects  GOALS: Goals reviewed with patient? Yes  SHORT TERM GOALS: Target date: 10 sessions  UPDATED - 06/24/2022  UPDATED - 07/19/2022 Given moderate assistance, pt will answer simple basic yes/no questions with 50% accuracy.  Baseline: 25% Goal status: MET UPDATED goal: Given minimal assistance, pt will answer simple basic yes/no questions with 75% accuracy.  Goal status: MET   2.  Given moderate assistance, pt will  follow simple basic 1 step directions with 50% accuracy.  Baseline: 0% Goal status: MET  UPDATED goal: Given minimal assistance, pt will following simple basic 1 step directions with 75% accuracy.   Goal status: MET   3.  With maximal multimodal cues, pt will name set of 10 common objects in 8 out of 10 opportunities.  Baseline: unable Goal status: MET UPDATED goal: With moderate cues, pt will name set of 10 common objects in 8 out of 10 opportunities.  Goal status: MET UPDATED goal: With minimal cues, pt will name set of 10 common objects in 8 out of 10 opportunities.    4. Pt will improve reading abilities by placing phrase level object function with object in field of 3 with 50% ability and moderate cues.              Baseline: object name to object - 90%             Goal status: MET  UPDATED goal: Pt will demonstrate reading comprehension at the sentence level for basic information with >  75% accuracy and min A cues.   Goal status: ONGOING   5. Pt will improve spelling abilities by completing common words by filling in missing letter with 50% ability and moderate cues.              Baseline: unable             Goal status: MET UPDATED goal: Pt will improve spelling abilities by completing common words by filling in missing letters with >90% accuracy and rare min A cues.  Goal status: MET UPDATED goal: Pt will improve spelling ability by writing names of basic objects with supervision level cues in 8 out of 10 opportunities.    6. Pt will repeat rote phrases with 25% speech intelligibility given max to moderate cues.              Baseline: unable             Goal status: MET  UPDATED goal: Pt will repeat rote phrases with ~ 75% speech intelligibility given minimal cues.   Goal status: ONGOING    LONG TERM GOALS: Target date: 08/26/2021   Pt will use multimodal communication to express basic wants/needs in 4 of 10 opportunities.  Baseline: unable Goal status: ONGOING   2.  Pt  will demonstrate increased reading comprehension by selecting accurate answer from 3 choices in 2 out of 10 opportunities.  Baseline: unable Goal status: ONGOING   3.  Pt will correctly spell his name in 5 out of 10 opportunities.  Baseline: unable Goal status: ONGOING  ASSESSMENT:  CLINICAL IMPRESSION: Pt continues to struggle with emergent awareness and benefits from awareness activities as well as repetitive production of target words d/t motor speech deficits.    OBJECTIVE IMPAIRMENTS include expressive language, receptive language, aphasia, and apraxia. These impairments are limiting patient from managing medications, managing appointments, managing finances, household responsibilities, ADLs/IADLs, and effectively communicating at home and in community. Factors affecting potential to achieve goals and functional outcome are severity of impairments. Patient will benefit from skilled SLP services to address above impairments and improve overall function.  REHAB POTENTIAL: Excellent  PLAN: SLP FREQUENCY: 3x/week  SLP DURATION: 12 weeks  PLANNED INTERVENTIONS: Language facilitation, Environmental controls, Internal/external aids, Functional tasks, Multimodal communication approach, SLP instruction and feedback, Compensatory strategies, and Patient/family education   Dorlis Judice B. Rutherford Nail, M.S., CCC-SLP, Mining engineer Certified Brain Injury [place]  [place] Office [phone] Ascom [phone] Fax [phone]

## 2022-07-24 ENCOUNTER — Ambulatory Visit: Payer: Medicare HMO | Admitting: Speech Pathology

## 2022-07-24 DIAGNOSIS — R482 Apraxia: Secondary | ICD-10-CM

## 2022-07-24 DIAGNOSIS — R4701 Aphasia: Secondary | ICD-10-CM | POA: Diagnosis not present

## 2022-07-24 DIAGNOSIS — I6602 Occlusion and stenosis of left middle cerebral artery: Secondary | ICD-10-CM | POA: Diagnosis not present

## 2022-07-24 NOTE — Therapy (Signed)
OUTPATIENT SPEECH LANGUAGE PATHOLOGY TREATMENT NOTE   Patient Name: Alan Stewart MRN: [id number] DOB:07-Apr-1946, 76 y.o., male Today's Date: 07/24/2022     PCP: Ramonita Lab, MD REFERRING PROVIDER: Ramonita Lab, MD   END OF SESSION  End of Session - 07/24/22 1446     Visit Number 22    Number of Visits 25    Date for SLP Re-Evaluation 08/26/22    Authorization Type [employer] HMO    Progress Note Due on Visit 78    SLP Start Time 1000    SLP Stop Time  1115    SLP Time Calculation (min) 75 min    Activity Tolerance Patient tolerated treatment well               Past Medical History:  Diagnosis Date   CAD (coronary artery disease)    a. 10/2021 NSTEMI/PCI: LM nl, LAD min irregs, LCX 87m(2.5x18 Onyx Frontier DES), OM1 nl, RCA 958m3.5x26 Onyx Frontier DES), 30d.   Diastolic dysfunction    a. 10/2021 Echo: EF 50-55%, no rwma, Gr1 DD, nl RV fxn. No significant valvular dzs.   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 SCCis. Tx pending   Past Surgical History:  Procedure Laterality Date   COLONOSCOPY N/A 10/09/2015   Procedure: COLONOSCOPY;  Surgeon: PaHulen LusterMD;  [facility];  Service: Gastroenterology;  Laterality: N/A;   CORONARY STENT INTERVENTION N/A 10/22/2021   Procedure: CORONARY STENT INTERVENTION;  Surgeon: ArWellington HampshireMD;  [facility];  Service: Cardiovascular;  Laterality: N/A;   IR CT HEAD LTD  05/21/2022   IR PERCUTANEOUS ART THROMBECTOMY/INFUSION INTRACRANIAL INC DIAG ANGIO  05/21/2022   LEFT HEART CATH AND CORONARY ANGIOGRAPHY N/A 10/22/2021   Procedure: LEFT HEART CATH AND CORONARY ANGIOGRAPHY;  Surgeon: ArWellington HampshireMD;  [facility];  Service: Cardiovascular;  Laterality: N/A;    LOOP RECORDER INSERTION N/A 05/23/2022   Procedure: LOOP RECORDER INSERTION;  Surgeon: LaVickie EpleyMD;  [facility];  Service: Cardiovascular;  Laterality: N/A;   RADIOLOGY WITH ANESTHESIA N/A 05/20/2022   Procedure: IR WITH ANESTHESIA;  Surgeon: DeLuanne BrasMD;  [place] [facility] Service: Radiology;  Laterality: N/A;   ROTATOR CUFF REPAIR     Patient Active Problem List   Diagnosis Date Noted   Stroke (cerebrum) (H[place]10/10/2021   Middle cerebral artery embolism, 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 male with PMH: HTN, CAD, HLD, osteoarthritis, SCC, sleep apnea. He presented to the hospital on 05/20/22 with right sided weakness and aphasia with LKW reported by wife to be 10pm on 10/2.  [facility]/CTA which demonstrated left M1 occlusion. He received IV tenecteplase and IR thrombectomy due to large  vessel occlusion.       DIAGNOSTIC FINDINGS:  MRI 05/22/2022 Cortical ischemia throughout the left MCA territory compatible with recent infarct. Relatively little affect on the white matter. 2. Small focus of acute hemorrhage along the anterior surface of the left temporal lobe Heidelberg classification 3c: Subarachnoid hemorrhage. 3. Normal intracranial MRA.  Restored flow in left MCA.    THERAPY DIAG:  Aphasia  Middle cerebral artery embolism, left  Apraxia  Rationale for Evaluation and Treatment Rehabilitation  SUBJECTIVE: We worked on that word  Pt accompanied by: self and significant other  PAIN:  Are you  having pain? No  PATIENT GOALS: to be able to communicate again  OBJECTIVE:   TODAY'S TREATMENT: Skilled treatment session focused on pt's communication goals. SLP facilitated session by providing the following interventions:  Demonstrated use of visual cues to support verbal expression to improve context/topic of conversation and to provide visual cues to refer back to during expressive communication - pt also used drawing to supplement communication about his pill   Pt with difficulty explaining which trash he took out earlier in the morning. Pt prompted to right down information but he had incorrect recall of garbage vs receyling. SLP made list of DOW and under each day had pt write salient events such as "[place], Bonnita Nasuti work, garbage, recycling" - Pt able to say DOW with > 95% speech intelligibility and using sentence completion and visual cues, pt able to state day with > 95% accuracy.   Reading comprehension: SLP provided the above written information with pt able to select correct day of the week from choice of 3 to match written information.   Skilled instruction/education provided to pt's wife on facilitating conversational word finding vs. confrontational word drills.       PATIENT EDUCATION: Education details: emergent awareness Person educated: Patient and Wife Education method: Explanation, Demonstration, Verbal cues, and Handouts Education comprehension: verbalized understanding  HOME EXERCISE PROGRAM:  Complete worksheets made by SLP targeting basic objects  GOALS: Goals reviewed with patient? Yes  SHORT TERM GOALS: Target date: 10 sessions  UPDATED - 06/24/2022  UPDATED - 07/19/2022 Given moderate assistance, pt will answer simple basic yes/no questions with 50% accuracy.  Baseline: 25% Goal status: MET UPDATED goal: Given minimal assistance, pt will answer simple basic yes/no questions with 75% accuracy.  Goal status: MET   2.  Given moderate assistance, pt will  follow simple basic 1 step directions with 50% accuracy.  Baseline: 0% Goal status: MET  UPDATED goal: Given minimal assistance, pt will following simple basic 1 step directions with 75% accuracy.   Goal status: MET   3.  With maximal multimodal cues, pt will name set of 10 common objects in 8 out of 10 opportunities.  Baseline: unable Goal status: MET UPDATED goal: With moderate cues, pt will name set of 10 common objects in 8 out of 10 opportunities.  Goal status: MET UPDATED goal: With minimal cues, pt will name set of 10 common objects in 8 out of 10 opportunities.    4. Pt will improve reading abilities by placing phrase level object function with object in field of 3 with 50% ability and moderate cues.              Baseline: object name to object - 90%             Goal status: MET  UPDATED goal: Pt will demonstrate reading comprehension at the sentence level for basic information with > 75% accuracy  and min A cues.   Goal status: ONGOING   5. Pt will improve spelling abilities by completing common words by filling in missing letter with 50% ability and moderate cues.              Baseline: unable             Goal status: MET UPDATED goal: Pt will improve spelling abilities by completing common words by filling in missing letters with >90% accuracy and rare min A cues.  Goal status: MET UPDATED goal: Pt will improve spelling ability by writing names of basic objects with supervision level cues in 8 out of 10 opportunities.    6. Pt will repeat rote phrases with 25% speech intelligibility given max to moderate cues.              Baseline: unable             Goal status: MET  UPDATED goal: Pt will repeat rote phrases with ~ 75% speech intelligibility given minimal cues.   Goal status: ONGOING    LONG TERM GOALS: Target date: 08/26/2021   Pt will use multimodal communication to express basic wants/needs in 4 of 10 opportunities.  Baseline: unable Goal status: ONGOING   2.  Pt  will demonstrate increased reading comprehension by selecting accurate answer from 3 choices in 2 out of 10 opportunities.  Baseline: unable Goal status: ONGOING   3.  Pt will correctly spell his name in 5 out of 10 opportunities.  Baseline: unable Goal status: ONGOING  ASSESSMENT:  CLINICAL IMPRESSION: Pt with improved emergent awareness and inhibition of inappropriate rote letters.   OBJECTIVE IMPAIRMENTS include expressive language, receptive language, aphasia, and apraxia. These impairments are limiting patient from managing medications, managing appointments, managing finances, household responsibilities, ADLs/IADLs, and effectively communicating at home and in community. Factors affecting potential to achieve goals and functional outcome are severity of impairments. Patient will benefit from skilled SLP services to address above impairments and improve overall function.  REHAB POTENTIAL: Excellent  PLAN: SLP FREQUENCY: 3x/week  SLP DURATION: 12 weeks  PLANNED INTERVENTIONS: Language facilitation, Environmental controls, Internal/external aids, Functional tasks, Multimodal communication approach, SLP instruction and feedback, Compensatory strategies, and Patient/family education   Mikolaj Woolstenhulme B. Rutherford Nail, M.S., CCC-SLP, Mining engineer Certified Brain Injury [place]  [place] Office [phone] Ascom [phone] Fax [phone]

## 2022-07-25 ENCOUNTER — Ambulatory Visit: Payer: Medicare HMO | Admitting: Speech Pathology

## 2022-07-25 ENCOUNTER — Ambulatory Visit: Payer: Medicare HMO

## 2022-07-25 DIAGNOSIS — R482 Apraxia: Secondary | ICD-10-CM | POA: Diagnosis not present

## 2022-07-25 DIAGNOSIS — R4701 Aphasia: Secondary | ICD-10-CM

## 2022-07-25 DIAGNOSIS — I6602 Occlusion and stenosis of left middle cerebral artery: Secondary | ICD-10-CM

## 2022-07-29 ENCOUNTER — Ambulatory Visit: Payer: Medicare HMO | Admitting: Speech Pathology

## 2022-07-30 NOTE — Therapy (Signed)
OUTPATIENT SPEECH LANGUAGE PATHOLOGY TREATMENT NOTE   Patient Name: Alan Stewart MRN: [id number] DOB:07-21-1946, 76 y.o., male Today's Date: 07/30/2022     PCP: Ramonita Lab, MD REFERRING PROVIDER: Ramonita Lab, MD   END OF SESSION  End of Session - 07/30/22 1322     Visit Number 23    Number of Visits 25    Date for SLP Re-Evaluation 08/26/22    Authorization Type [employer] HMO    Progress Note Due on Visit 83    SLP Start Time 0900    SLP Stop Time  1000    SLP Time Calculation (min) 60 min    Activity Tolerance Patient tolerated treatment well               Past Medical History:  Diagnosis Date   CAD (coronary artery disease)    a. 10/2021 NSTEMI/PCI: LM nl, LAD min irregs, LCX 40m(2.5x18 Onyx Frontier DES), OM1 nl, RCA 941m3.5x26 Onyx Frontier DES), 30d.   Diastolic dysfunction    a. 10/2021 Echo: EF 50-55%, no rwma, Gr1 DD, nl RV fxn. No significant valvular dzs.   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 SCCis. Tx pending   Past Surgical History:  Procedure Laterality Date   COLONOSCOPY N/A 10/09/2015   Procedure: COLONOSCOPY;  Surgeon: PaHulen LusterMD;  [facility];  Service: Gastroenterology;  Laterality: N/A;   CORONARY STENT INTERVENTION N/A 10/22/2021   Procedure: CORONARY STENT INTERVENTION;  Surgeon: ArWellington HampshireMD;  [facility];  Service: Cardiovascular;  Laterality: N/A;   IR CT HEAD LTD  05/21/2022   IR PERCUTANEOUS ART THROMBECTOMY/INFUSION INTRACRANIAL INC DIAG ANGIO  05/21/2022   LEFT HEART CATH AND CORONARY ANGIOGRAPHY N/A 10/22/2021   Procedure: LEFT HEART CATH AND CORONARY ANGIOGRAPHY;  Surgeon: ArWellington HampshireMD;  [facility];  Service: Cardiovascular;  Laterality: N/A;    LOOP RECORDER INSERTION N/A 05/23/2022   Procedure: LOOP RECORDER INSERTION;  Surgeon: LaVickie EpleyMD;  [place] [facility];  Service: Cardiovascular;  Laterality: N/A;   RADIOLOGY WITH ANESTHESIA N/A 05/20/2022   Procedure: IR WITH ANESTHESIA;  Surgeon: DeLuanne BrasMD;  Location: [facility] Service: Radiology;  Laterality: N/A;   ROTATOR CUFF REPAIR     Patient Active Problem List   Diagnosis Date Noted   Stroke (cerebrum) (HCOn[place]10/10/2021   Middle cerebral artery embolism, 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 male with PMH: HTN, CAD, HLD, osteoarthritis, SCC, sleep apnea. He presented to the hospital on 05/20/22 with right sided weakness and aphasia with LKW reported by wife to be 10pm on 10/2.  [facility] emergent CT/CTA which demonstrated left M1 occlusion. He received IV tenecteplase and IR thrombectomy due to large  vessel occlusion.       DIAGNOSTIC FINDINGS:  MRI 05/22/2022 Cortical ischemia throughout the left MCA territory compatible with recent infarct. Relatively little affect on the white matter. 2. Small focus of acute hemorrhage along the anterior surface of the left temporal lobe Heidelberg classification 3c: Subarachnoid hemorrhage. 3. Normal intracranial MRA.  Restored flow in left MCA.    THERAPY DIAG:  Aphasia  Apraxia  Middle cerebral artery embolism, left  Rationale for Evaluation and Treatment Rehabilitation  SUBJECTIVE: We worked on that word  Pt accompanied by: self and significant other  PAIN:  Are you  having pain? No  PATIENT GOALS: to be able to communicate again  OBJECTIVE:   TODAY'S TREATMENT: Skilled treatment session focused on pt's communication goals. SLP facilitated session by providing the following interventions:  Reviewed DOW simulated calendar. With sentence completion, pt able to increased word finding abilities to > 90% for targeted words such as recyling, garbage, Bonnita Nasuti worked, Taiz Bickle therapy including DOW. Pt with questions regarding reason that it is easier for him to say DOW when naming all of them together vs saying the name of a request day (what is today?). Education provided on rote information vs direct word finding.   In addition, pt independently use writing to supplement verbal communication with great success in 5 out of 6 opportunities.      PATIENT EDUCATION: Education details: emergent awareness Person educated: Patient and Wife Education method: Explanation, Demonstration, Verbal cues, and Handouts Education comprehension: verbalized understanding  HOME EXERCISE PROGRAM:  Complete worksheets made by SLP targeting basic objects  GOALS: Goals reviewed with patient? Yes  SHORT TERM GOALS: Target date: 10 sessions  UPDATED - 06/24/2022  UPDATED - 07/19/2022 Given moderate assistance, pt will answer simple basic yes/no questions with 50% accuracy.  Baseline: 25% Goal status: MET UPDATED goal: Given minimal assistance, pt will answer simple basic yes/no questions with 75% accuracy.  Goal status: MET   2.  Given moderate assistance, pt will follow simple basic 1 step directions with 50% accuracy.  Baseline: 0% Goal status: MET  UPDATED goal: Given minimal assistance, pt will following simple basic 1 step directions with 75% accuracy.   Goal status: MET   3.  With maximal multimodal cues, pt will name set of 10 common objects in 8 out of 10 opportunities.  Baseline: unable Goal status: MET UPDATED goal: With moderate cues, pt will name set of 10  common objects in 8 out of 10 opportunities.  Goal status: MET UPDATED goal: With minimal cues, pt will name set of 10 common objects in 8 out of 10 opportunities.    4. Pt will improve reading abilities by placing phrase level object function with object in field of 3 with 50% ability and moderate cues.              Baseline: object name to object - 90%             Goal status: MET  UPDATED goal: Pt will demonstrate reading comprehension at the sentence level for basic information with > 75% accuracy and min A cues.   Goal status: ONGOING   5. Pt will improve spelling abilities by completing common words by filling in missing letter with 50% ability and moderate cues.              Baseline: unable             Goal status: MET UPDATED goal: Pt will improve spelling abilities by  completing common words by filling in missing letters with >90% accuracy and rare min A cues.  Goal status: MET UPDATED goal: Pt will improve spelling ability by writing names of basic objects with supervision level cues in 8 out of 10 opportunities.    6. Pt will repeat rote phrases with 25% speech intelligibility given max to moderate cues.              Baseline: unable             Goal status: MET  UPDATED goal: Pt will repeat rote phrases with ~ 75% speech intelligibility given minimal cues.   Goal status: ONGOING    LONG TERM GOALS: Target date: 08/26/2021   Pt will use multimodal communication to express basic wants/needs in 4 of 10 opportunities.  Baseline: unable Goal status: ONGOING   2.  Pt will demonstrate increased reading comprehension by selecting accurate answer from 3 choices in 2 out of 10 opportunities.  Baseline: unable Goal status: ONGOING   3.  Pt will correctly spell his name in 5 out of 10 opportunities.  Baseline: unable Goal status: ONGOING  ASSESSMENT:  CLINICAL IMPRESSION: Pt with improved emergent awareness and inhibition of inappropriate rote letters.   OBJECTIVE  IMPAIRMENTS include expressive language, receptive language, aphasia, and apraxia. These impairments are limiting patient from managing medications, managing appointments, managing finances, household responsibilities, ADLs/IADLs, and effectively communicating at home and in community. Factors affecting potential to achieve goals and functional outcome are severity of impairments. Patient will benefit from skilled SLP services to address above impairments and improve overall function.  REHAB POTENTIAL: Excellent  PLAN: SLP FREQUENCY: 3x/week  SLP DURATION: 12 weeks  PLANNED INTERVENTIONS: Language facilitation, Environmental controls, Internal/external aids, Functional tasks, Multimodal communication approach, SLP instruction and feedback, Compensatory strategies, and Patient/family education   Emarie Paul B. Rutherford Nail, M.S., CCC-SLP, Mining engineer Certified Brain Injury [place]  [place] Office [phone] Ascom [phone] Fax [phone]

## 2022-07-31 ENCOUNTER — Encounter: Payer: Medicare HMO | Admitting: Occupational Therapy

## 2022-07-31 ENCOUNTER — Ambulatory Visit: Payer: Medicare HMO | Admitting: Speech Pathology

## 2022-07-31 ENCOUNTER — Ambulatory Visit: Payer: Medicare HMO

## 2022-07-31 DIAGNOSIS — R4701 Aphasia: Secondary | ICD-10-CM

## 2022-07-31 DIAGNOSIS — I6602 Occlusion and stenosis of left middle cerebral artery: Secondary | ICD-10-CM

## 2022-07-31 DIAGNOSIS — R482 Apraxia: Secondary | ICD-10-CM | POA: Diagnosis not present

## 2022-07-31 NOTE — Therapy (Signed)
OUTPATIENT SPEECH LANGUAGE PATHOLOGY TREATMENT NOTE   Patient Name: Alan Stewart MRN: [id number] DOB:February 15, 1946, 76 y.o., male Today's Date: 07/31/2022     PCP: Ramonita Lab, MD REFERRING PROVIDER: Ramonita Lab, MD   END OF SESSION  End of Session - 07/31/22 1246     Visit Number 24    Number of Visits 25    Date for SLP Re-Evaluation 08/26/22    Authorization Type [employer] HMO    Progress Note Due on Visit 74    SLP Start Time 1000    SLP Stop Time  1100    SLP Time Calculation (min) 60 min    Activity Tolerance Patient tolerated treatment well               Past Medical History:  Diagnosis Date   CAD (coronary artery disease)    a. 10/2021 NSTEMI/PCI: LM nl, LAD min irregs, LCX 60m(2.5x18 Onyx Frontier DES), OM1 nl, RCA 981m3.5x26 Onyx Frontier DES), 30d.   Diastolic dysfunction    a. 10/2021 Echo: EF 50-55%, no rwma, Gr1 DD, nl RV fxn. No significant valvular dzs.   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 SCCis. Tx pending   Past Surgical History:  Procedure Laterality Date   COLONOSCOPY N/A 10/09/2015   Procedure: COLONOSCOPY;  Surgeon: PaHulen LusterMD;  [facility];  Service: Gastroenterology;  Laterality: N/A;   CORONARY STENT INTERVENTION N/A 10/22/2021   Procedure: CORONARY STENT INTERVENTION;  Surgeon: ArWellington HampshireMD;  [place];  Service: Cardiovascular;  Laterality: N/A;   IR CT HEAD LTD  05/21/2022   IR PERCUTANEOUS ART THROMBECTOMY/INFUSION INTRACRANIAL INC DIAG ANGIO  05/21/2022   LEFT HEART CATH AND CORONARY ANGIOGRAPHY N/A 10/22/2021   Procedure: LEFT HEART CATH AND CORONARY ANGIOGRAPHY;  Surgeon: ArWellington HampshireMD;  [facility];  Service: Cardiovascular;  Laterality: N/A;    LOOP RECORDER INSERTION N/A 05/23/2022   Procedure: LOOP RECORDER INSERTION;  Surgeon: LaVickie EpleyMD;  [facility];  Service: Cardiovascular;  Laterality: N/A;   RADIOLOGY WITH ANESTHESIA N/A 05/20/2022   Procedure: IR WITH ANESTHESIA;  Surgeon: DeLuanne BrasMD;  Location: [facility] Service: Radiology;  Laterality: N/A;   ROTATOR CUFF REPAIR     Patient Active Problem List   Diagnosis Date Noted   Stroke (cerebrum) (HCPlymouth10/10/2021   Middle cerebral artery embolism, 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 male with PMH: HTN, CAD, HLD, osteoarthritis, SCC, sleep apnea. He presented to the hospital on 05/20/22 with right sided weakness and aphasia with LKW reported by wife to be 10pm on 10/2.  [facility] for emergent CT/CTA which demonstrated left M1 occlusion. He received IV tenecteplase and IR thrombectomy due to large  vessel occlusion.       DIAGNOSTIC FINDINGS:  MRI 05/22/2022 Cortical ischemia throughout the left MCA territory compatible with recent infarct. Relatively little affect on the white matter. 2. Small focus of acute hemorrhage along the anterior surface of the left temporal lobe Heidelberg classification 3c: Subarachnoid hemorrhage. 3. Normal intracranial MRA.  Restored flow in left MCA.    THERAPY DIAG:  Aphasia  Apraxia  Middle cerebral artery embolism, left  Rationale for Evaluation and Treatment Rehabilitation  SUBJECTIVE: "I get so frustrated with him, He won't get up and show me or write things down" Pt  accompanied by: self and significant other  PAIN:  Are you having pain? No  PATIENT GOALS: to be able to communicate again  OBJECTIVE:   TODAY'S TREATMENT: Skilled treatment session focused on pt's communication goals. SLP facilitated session by providing the following interventions:  In discussing some baseline pt behavior, it was mentioned that he enjoys calling and playing the [employer]. While pt is not currently placing bets, he continues to call multiple times per day and write down the winning numbers. However, given the extent of pt's language impairment, concern that pt is inaccurately writing down numbers. During the session, pt placed call on his phone a total of 9 times. He was incorrect in all 9 times when writing down the numbers provided. Pt was not aware of these errors. Education provided on activity reinforcing pt's impairment. Strict information provided with request that pt stop calling and writing down inaccurate numbers.      PATIENT EDUCATION: Education details: emergent awareness Person educated: Patient and Wife Education method: Explanation, Demonstration, Verbal cues, and Handouts Education comprehension: verbalized understanding  HOME EXERCISE PROGRAM:  Complete worksheets made by SLP targeting basic objects  GOALS: Goals reviewed with patient? Yes  SHORT TERM GOALS: Target date: 10 sessions  UPDATED - 06/24/2022  UPDATED - 07/19/2022 Given moderate assistance, pt will answer simple basic yes/no questions with 50% accuracy.  Baseline: 25% Goal status: MET UPDATED goal: Given minimal assistance, pt will answer simple basic yes/no questions with 75% accuracy.  Goal status: MET   2.  Given moderate assistance, pt will follow simple basic 1 step directions with 50% accuracy.  Baseline: 0% Goal status: MET  UPDATED goal: Given minimal assistance, pt will following simple basic 1 step directions with 75% accuracy.   Goal status: MET   3.  With maximal  multimodal cues, pt will name set of 10 common objects in 8 out of 10 opportunities.  Baseline: unable Goal status: MET UPDATED goal: With moderate cues, pt will name set of 10 common objects in 8 out of 10 opportunities.  Goal status: MET UPDATED goal: With minimal cues, pt will name set of 10 common objects in 8 out of 10 opportunities.    4. Pt will improve reading abilities by placing phrase level object function with object in field of 3 with 50% ability and moderate cues.              Baseline: object name to object - 90%             Goal status: MET  UPDATED goal: Pt will demonstrate reading comprehension at the sentence level for basic information with > 75% accuracy and min A cues.   Goal status: ONGOING   5. Pt will improve spelling abilities by completing common words by filling in missing letter with 50% ability and moderate cues.  Baseline: unable             Goal status: MET UPDATED goal: Pt will improve spelling abilities by completing common words by filling in missing letters with >90% accuracy and rare min A cues.  Goal status: MET UPDATED goal: Pt will improve spelling ability by writing names of basic objects with supervision level cues in 8 out of 10 opportunities.    6. Pt will repeat rote phrases with 25% speech intelligibility given max to moderate cues.              Baseline: unable             Goal status: MET  UPDATED goal: Pt will repeat rote phrases with ~ 75% speech intelligibility given minimal cues.   Goal status: ONGOING    LONG TERM GOALS: Target date: 08/26/2021   Pt will use multimodal communication to express basic wants/needs in 4 of 10 opportunities.  Baseline: unable Goal status: ONGOING   2.  Pt will demonstrate increased reading comprehension by selecting accurate answer from 3 choices in 2 out of 10 opportunities.  Baseline: unable Goal status: ONGOING   3.  Pt will correctly spell his name in 5 out of 10 opportunities.   Baseline: unable Goal status: ONGOING  ASSESSMENT:  CLINICAL IMPRESSION: Pt is willing to stop behaviors that are not conducive to progress.   OBJECTIVE IMPAIRMENTS include expressive language, receptive language, aphasia, and apraxia. These impairments are limiting patient from managing medications, managing appointments, managing finances, household responsibilities, ADLs/IADLs, and effectively communicating at home and in community. Factors affecting potential to achieve goals and functional outcome are severity of impairments. Patient will benefit from skilled SLP services to address above impairments and improve overall function.  REHAB POTENTIAL: Excellent  PLAN: SLP FREQUENCY: 3x/week  SLP DURATION: 12 weeks  PLANNED INTERVENTIONS: Language facilitation, Environmental controls, Internal/external aids, Functional tasks, Multimodal communication approach, SLP instruction and feedback, Compensatory strategies, and Patient/family education   Latoia Eyster B. Rutherford Nail, M.S., CCC-SLP, Mining engineer Certified Brain Injury [place]  [place] Office [phone] Ascom [phone] Fax [phone]

## 2022-08-02 ENCOUNTER — Ambulatory Visit: Payer: Medicare HMO | Admitting: Speech Pathology

## 2022-08-02 DIAGNOSIS — R4701 Aphasia: Secondary | ICD-10-CM | POA: Diagnosis not present

## 2022-08-02 DIAGNOSIS — R482 Apraxia: Secondary | ICD-10-CM | POA: Diagnosis not present

## 2022-08-02 DIAGNOSIS — I6602 Occlusion and stenosis of left middle cerebral artery: Secondary | ICD-10-CM | POA: Diagnosis not present

## 2022-08-02 NOTE — Therapy (Addendum)
OUTPATIENT SPEECH LANGUAGE PATHOLOGY TREATMENT NOTE RE-CERTIFICATION REQUEST  Patient Name: Alan Stewart MRN: [id number] DOB:03/21/1946, 76 y.o., male Today's Date: 08/02/2022     PCP: Ramonita Lab, MD REFERRING PROVIDER: Ramonita Lab, MD   END OF SESSION  End of Session - 08/02/22 1535     Visit Number 25    Number of Visits 35   Date for SLP Re-Evaluation 10/25/22    Authorization Type [employer] HMO    Progress Note Due on Visit 50    SLP Start Time 0945    SLP Stop Time  1045    SLP Time Calculation (min) 60 min    Activity Tolerance Patient tolerated treatment well               Past Medical History:  Diagnosis Date   CAD (coronary artery disease)    a. 10/2021 NSTEMI/PCI: LM nl, LAD min irregs, LCX 62m(2.5x18 Onyx Frontier DES), OM1 nl, RCA 981m3.5x26 Onyx Frontier DES), 30d.   Diastolic dysfunction    a. 10/2021 Echo: EF 50-55%, no rwma, Gr1 DD, nl RV fxn. No significant valvular dzs.   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 SCCis. Tx pending   Past Surgical History:  Procedure Laterality Date   COLONOSCOPY N/A 10/09/2015   Procedure: COLONOSCOPY;  Surgeon: PaHulen LusterMD;  [facility];  Service: Gastroenterology;  Laterality: N/A;   CORONARY STENT INTERVENTION N/A 10/22/2021   Procedure: CORONARY STENT INTERVENTION;  Surgeon: ArWellington HampshireMD;  [facility];  Service: Cardiovascular;  Laterality: N/A;   IR CT HEAD LTD  05/21/2022   IR PERCUTANEOUS ART THROMBECTOMY/INFUSION INTRACRANIAL INC DIAG ANGIO  05/21/2022   LEFT HEART CATH AND CORONARY ANGIOGRAPHY N/A 10/22/2021   Procedure: LEFT HEART CATH AND CORONARY ANGIOGRAPHY;  Surgeon: ArWellington HampshireMD;  [facility];  Service:  Cardiovascular;  Laterality: N/A;   LOOP RECORDER INSERTION N/A 05/23/2022   Procedure: LOOP RECORDER INSERTION;  Surgeon: LaVickie EpleyMD;  [facility];  Service: Cardiovascular;  Laterality: N/A;   RADIOLOGY WITH ANESTHESIA N/A 05/20/2022   Procedure: IR WITH ANESTHESIA;  Surgeon: DeLuanne BrasMD;  [place]: [facility] Service: Radiology;  Laterality: N/A;   ROTATOR CUFF REPAIR     Patient Active Problem List   Diagnosis Date Noted   Stroke (cerebrum) (HCTamalpais-Homestead Valley10/10/2021   Middle cerebral artery embolism, 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 male with PMH: HTN, CAD, HLD, osteoarthritis, SCC, sleep apnea. He presented to the hospital on 05/20/22 with right sided weakness and aphasia with LKW reported by wife to be 10pm on 10/2.  [facility] emergent CT/CTA which demonstrated left M1 occlusion. He received IV tenecteplase and IR thrombectomy due to large  vessel occlusion.       DIAGNOSTIC FINDINGS:  MRI 05/22/2022 Cortical ischemia throughout the left MCA territory compatible with recent infarct. Relatively little affect on the white matter. 2. Small focus of acute hemorrhage along the anterior surface of the left temporal lobe Heidelberg classification 3c: Subarachnoid hemorrhage. 3. Normal intracranial MRA.  Restored flow in left MCA.    THERAPY DIAG:  Aphasia  Apraxia  Middle cerebral artery embolism, left  Rationale for Evaluation and Treatment Rehabilitation  SUBJECTIVE: "he is good with plate, napkin, shirt"  Pt accompanied  by: self and significant other  PAIN:  Are you having pain? No  PATIENT GOALS: to be able to communicate again  OBJECTIVE:   TODAY'S TREATMENT: Skilled treatment session focused on pt's communication goals. SLP facilitated session by providing the following interventions:  Pt's nephew is coming to town. Therefore targeted practice of his name as well as his wife's name was facilitated. Pt continues to benefit from visual printed name to improve verbal speech intelligibility. After initial Mod A, pt able to state independently after multiple delays in the session.   In addition, SLP further facilitated session by targeting DOW and activities within each day. Pt able to independently write down basic 1-2 word description- "no where" meaning he went "no where"; "helen work"  Continued education on language hierarchy such as rote production of DOW is easier than answering "what day is today?"       PATIENT EDUCATION: Education details: see above Person educated: Patient and Wife Education method: Explanation, Demonstration, Verbal cues, and Handouts Education comprehension: verbalized understanding  HOME EXERCISE PROGRAM:  Complete worksheets made by SLP targeting basic objects  GOALS: Goals reviewed with patient? Yes  SHORT TERM GOALS: Target date: 10 sessions  UPDATED - 06/24/2022  UPDATED - 07/19/2022 Given moderate assistance, pt will answer simple basic yes/no questions with 50% accuracy.  Baseline: 25% Goal status: MET UPDATED goal: Given minimal assistance, pt will answer simple basic yes/no questions with 75% accuracy.  Goal status: MET   2.  Given moderate assistance, pt will follow simple basic 1 step directions with 50% accuracy.  Baseline: 0% Goal status: MET  UPDATED goal: Given minimal assistance, pt will following simple basic 1 step directions with 75% accuracy.   Goal status: MET   3.  With maximal multimodal cues, pt will name set of 10 common objects in 8  out of 10 opportunities.  Baseline: unable Goal status: MET UPDATED goal: With moderate cues, pt will name set of 10 common objects in 8 out of 10 opportunities.  Goal status: MET UPDATED goal: With minimal cues, pt will name set of 10 common objects in 8 out of 10 opportunities.    4. Pt will improve reading abilities by placing phrase level object function with object in field of 3 with 50% ability and moderate cues.              Baseline: object name to object - 90%             Goal status: MET  UPDATED goal: Pt will demonstrate reading comprehension at the sentence level for basic information with > 75% accuracy and min A cues.   Goal status: ONGOING   5. Pt will improve spelling abilities by completing common words by filling in missing letter with 50% ability and moderate cues.              Baseline: unable  Goal status: MET UPDATED goal: Pt will improve spelling abilities by completing common words by filling in missing letters with >90% accuracy and rare min A cues.  Goal status: MET UPDATED goal: Pt will improve spelling ability by writing names of basic objects with supervision level cues in 8 out of 10 opportunities.    6. Pt will repeat rote phrases with 25% speech intelligibility given max to moderate cues.              Baseline: unable             Goal status: MET  UPDATED goal: Pt will repeat rote phrases with ~ 75% speech intelligibility given minimal cues.   Goal status: ONGOING    LONG TERM GOALS: Target date: 08/26/2021   Pt will use multimodal communication to express basic wants/needs in 4 of 10 opportunities.  Baseline: unable Goal status: ONGOING   2.  Pt will demonstrate increased reading comprehension by selecting accurate answer from 3 choices in 2 out of 10 opportunities.  Baseline: unable Goal status: ONGOING   3.  Pt will correctly spell his name in 5 out of 10 opportunities.  Baseline: unable Goal status:  ONGOING  ASSESSMENT:  CLINICAL IMPRESSION: Pt continues to be motivated as evidenced by resisting urge to call for [employer] numbers.   OBJECTIVE IMPAIRMENTS include expressive language, receptive language, aphasia, and apraxia. These impairments are limiting patient from managing medications, managing appointments, managing finances, household responsibilities, ADLs/IADLs, and effectively communicating at home and in community. Factors affecting potential to achieve goals and functional outcome are severity of impairments. Patient will benefit from skilled SLP services to address above impairments and improve overall function.  REHAB POTENTIAL: Excellent  PLAN: SLP FREQUENCY: 3x/week  SLP DURATION: 12 weeks  PLANNED INTERVENTIONS: Language facilitation, Environmental controls, Internal/external aids, Functional tasks, Multimodal communication approach, SLP instruction and feedback, Compensatory strategies, and Patient/family education   Chais Fehringer B. Rutherford Nail, M.S., CCC-SLP, Mining engineer Certified Brain Injury [place]  [place] Office [phone] Ascom [phone] Fax [phone]

## 2022-08-05 ENCOUNTER — Ambulatory Visit: Payer: Medicare HMO | Admitting: Speech Pathology

## 2022-08-05 DIAGNOSIS — I6602 Occlusion and stenosis of left middle cerebral artery: Secondary | ICD-10-CM | POA: Diagnosis not present

## 2022-08-05 DIAGNOSIS — R4701 Aphasia: Secondary | ICD-10-CM

## 2022-08-05 DIAGNOSIS — R482 Apraxia: Secondary | ICD-10-CM

## 2022-08-07 ENCOUNTER — Ambulatory Visit: Payer: Medicare HMO

## 2022-08-07 ENCOUNTER — Ambulatory Visit: Payer: Medicare HMO | Admitting: Speech Pathology

## 2022-08-07 ENCOUNTER — Telehealth: Payer: Self-pay

## 2022-08-07 ENCOUNTER — Encounter: Payer: Medicare HMO | Admitting: Occupational Therapy

## 2022-08-07 NOTE — Telephone Encounter (Signed)
Specimen tracking and history updated from MOHs progress notes. aw 

## 2022-08-08 ENCOUNTER — Ambulatory Visit: Payer: Medicare HMO | Admitting: Speech Pathology

## 2022-08-08 DIAGNOSIS — R482 Apraxia: Secondary | ICD-10-CM

## 2022-08-08 DIAGNOSIS — I6602 Occlusion and stenosis of left middle cerebral artery: Secondary | ICD-10-CM | POA: Diagnosis not present

## 2022-08-08 DIAGNOSIS — R4701 Aphasia: Secondary | ICD-10-CM

## 2022-08-08 NOTE — Therapy (Addendum)
OUTPATIENT SPEECH LANGUAGE PATHOLOGY TREATMENT NOTE   Patient Name: Alan Stewart MRN: [id number] DOB:November 09, 1945, 76 y.o., male Today's Date: 08/08/2022     PCP: Ramonita Lab, MD REFERRING PROVIDER: Ramonita Lab, MD   END OF SESSION  End of Session - 08/08/22 1350     Visit Number 27    Number of Visits 101    Date for SLP Re-Evaluation 10/25/22    Authorization Type [employer] HMO    Progress Note Due on Visit 56    SLP Start Time 1000    SLP Stop Time  1100    SLP Time Calculation (min) 60 min    Activity Tolerance Patient tolerated treatment well               Past Medical History:  Diagnosis Date   CAD (coronary artery disease)    a. 10/2021 NSTEMI/PCI: LM nl, LAD min irregs, LCX 49m(2.5x18 Onyx Frontier DES), OM1 nl, RCA 947m3.5x26 Onyx Frontier DES), 30d.   Diastolic dysfunction    a. 10/2021 Echo: EF 50-55%, no rwma, Gr1 DD, nl RV fxn. No significant valvular dzs.   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 SCCis. MOHs 07/08/22   Past Surgical History:  Procedure Laterality Date   COLONOSCOPY N/A 10/09/2015   Procedure: COLONOSCOPY;  Surgeon: PaHulen LusterMD;  [facility];  Service: Gastroenterology;  Laterality: N/A;   CORONARY STENT INTERVENTION N/A 10/22/2021   Procedure: CORONARY STENT INTERVENTION;  Surgeon: ArWellington HampshireMD;  [facility];  Service: Cardiovascular;  Laterality: N/A;   IR CT HEAD LTD  05/21/2022   IR PERCUTANEOUS ART THROMBECTOMY/INFUSION INTRACRANIAL INC DIAG ANGIO  05/21/2022   LEFT HEART CATH AND CORONARY ANGIOGRAPHY N/A 10/22/2021   Procedure: LEFT HEART CATH AND CORONARY ANGIOGRAPHY;  Surgeon: ArWellington HampshireMD;  [place] [facility];  Service: Cardiovascular;  Laterality:  N/A;   LOOP RECORDER INSERTION N/A 05/23/2022   Procedure: LOOP RECORDER INSERTION;  Surgeon: LaVickie EpleyMD;  [place]: [facility];  Service: Cardiovascular;  Laterality: N/A;   RADIOLOGY WITH ANESTHESIA N/A 05/20/2022   Procedure: IR WITH ANESTHESIA;  Surgeon: DeLuanne BrasMD;  [place] [facility] Service: Radiology;  Laterality: N/A;   ROTATOR CUFF REPAIR     Patient Active Problem List   Diagnosis Date Noted   Stroke (cerebrum) (HC[place]10/10/2021   Middle cerebral artery embolism, 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 male with PMH: HTN, CAD, HLD, osteoarthritis, SCC, sleep apnea. He presented to the hospital on 05/20/22 with right sided weakness and aphasia with LKW reported by wife to be 10pm on 10/2.  [facility] for emergent CT/CTA which demonstrated left M1 occlusion. He received IV tenecteplase and IR thrombectomy due to large  vessel occlusion.       DIAGNOSTIC FINDINGS:  MRI 05/22/2022 Cortical ischemia throughout the left MCA territory compatible with recent infarct. Relatively little affect on the white matter. 2. Small focus of acute hemorrhage along the anterior surface of the left temporal lobe Heidelberg classification 3c: Subarachnoid hemorrhage. 3. Normal intracranial MRA.  Restored flow in left MCA.    THERAPY DIAG:  Aphasia  Apraxia  Middle cerebral artery embolism, left  Rationale for Evaluation and Treatment Rehabilitation  SUBJECTIVE: pt and his wife are enjoying having company visit from out of town  Pt accompanied  by: self and significant other  PAIN:  Are you having pain? No  PATIENT GOALS: to be able to communicate again  OBJECTIVE:   TODAY'S TREATMENT: Skilled treatment session focused on pt's communication goals. SLP facilitated session by providing the following interventions:  Pt's wife reports that they eat out at a restaurant for a second time. She reports that it was very comfortable and pt was able to use printed material to order meal and verbal to respond to yes/no questions from the waitress.   They also report that their nephew bought pt a game of CONNECT 4. They plan to visit their grandchildren during Christmas and play.   SLP provided targeted practice of family member names in anticipation of upcoming visit. SLP also provided instruction to his wife on ways to facilitate practice at home using semantics to help facilitate accurate production.   SLP also introduced and instructed pt in playing double solitaire. Pt able to comprehend game and play accurately. In addition, pt counted correctly as he set up his solitaire and answered questions such as "how many rows to do you have?" With 100% accuracy. MUCH IMPROVED       PATIENT EDUCATION: Education details: see above Person educated: Patient and Wife Education method: Consulting civil engineer, Media planner, Verbal cues, and Handouts Education comprehension: verbalized understanding  HOME EXERCISE PROGRAM:  Complete worksheets made by SLP targeting basic objects  GOALS: Goals reviewed with patient? Yes  SHORT TERM GOALS: Target date: 10 sessions  UPDATED - 06/24/2022  UPDATED - 07/19/2022 Given moderate assistance, pt will answer simple basic yes/no questions with 50% accuracy.  Baseline: 25% Goal status: MET UPDATED goal: Given minimal assistance, pt will answer simple basic yes/no questions with 75% accuracy.  Goal status: MET   2.  Given moderate assistance, pt will follow simple basic 1 step directions with 50% accuracy.   Baseline: 0% Goal status: MET  UPDATED goal: Given minimal assistance, pt will following simple basic 1 step directions with 75% accuracy.   Goal status: MET   3.  With maximal multimodal cues, pt will name set of 10 common objects in 8 out of 10 opportunities.  Baseline: unable Goal status: MET UPDATED goal: With moderate cues, pt will name set of 10 common objects in 8 out of 10 opportunities.  Goal status: MET UPDATED goal: With minimal cues, pt will name set of 10 common objects in 8 out of 10 opportunities.    4. Pt will improve reading abilities by placing phrase level object function with object in field of 3 with 50% ability and moderate cues.              Baseline: object name to object - 90%             Goal status: MET  UPDATED goal: Pt will demonstrate reading comprehension at the sentence level for basic information with > 75% accuracy and  min A cues.   Goal status: ONGOING   5. Pt will improve spelling abilities by completing common words by filling in missing letter with 50% ability and moderate cues.              Baseline: unable             Goal status: MET UPDATED goal: Pt will improve spelling abilities by completing common words by filling in missing letters with >90% accuracy and rare min A cues.  Goal status: MET UPDATED goal: Pt will improve spelling ability by writing names of basic objects with supervision level cues in 8 out of 10 opportunities.    6. Pt will repeat rote phrases with 25% speech intelligibility given max to moderate cues.              Baseline: unable             Goal status: MET  UPDATED goal: Pt will repeat rote phrases with ~ 75% speech intelligibility given minimal cues.   Goal status: ONGOING    LONG TERM GOALS: Target date: 08/26/2021   Pt will use multimodal communication to express basic wants/needs in 4 of 10 opportunities.  Baseline: unable Goal status: ONGOING   2.  Pt will demonstrate increased reading comprehension by  selecting accurate answer from 3 choices in 2 out of 10 opportunities.  Baseline: unable Goal status: ONGOING   3.  Pt will correctly spell his name in 5 out of 10 opportunities.  Baseline: unable Goal status: ONGOING  ASSESSMENT:  CLINICAL IMPRESSION: Pt continues to present with improving expressive aphasia as evidenced by his accurate use of numbers functionally.   OBJECTIVE IMPAIRMENTS include expressive language, receptive language, aphasia, and apraxia. These impairments are limiting patient from managing medications, managing appointments, managing finances, household responsibilities, ADLs/IADLs, and effectively communicating at home and in community. Factors affecting potential to achieve goals and functional outcome are severity of impairments. Patient will benefit from skilled SLP services to address above impairments and improve overall function.  REHAB POTENTIAL: Excellent  PLAN: SLP FREQUENCY: 3x/week  SLP DURATION: 12 weeks  PLANNED INTERVENTIONS: Language facilitation, Environmental controls, Internal/external aids, Functional tasks, Multimodal communication approach, SLP instruction and feedback, Compensatory strategies, and Patient/family education   Arun Herrod B. Rutherford Nail, M.S., CCC-SLP, Mining engineer Certified Brain Injury [place]  [place] Office [phone] Ascom [phone] Fax [phone]

## 2022-08-08 NOTE — Therapy (Addendum)
OUTPATIENT SPEECH LANGUAGE PATHOLOGY TREATMENT NOTE   Patient Name: Alan Stewart MRN: [id number] DOB:1945-11-03, 76 y.o., male Today's Date: 08/05/2022     PCP: Ramonita Lab, MD REFERRING PROVIDER: Ramonita Lab, MD   END OF SESSION  End of Session - 08/05/22 1339     Visit Number 26    Number of Visits 25    Date for SLP Re-Evaluation 10/25/22    Authorization Type [employer] HMO    Progress Note Due on Visit 89    SLP Start Time 1000    SLP Stop Time  1100    SLP Time Calculation (min) 60 min    Activity Tolerance Patient tolerated treatment well               Past Medical History:  Diagnosis Date   CAD (coronary artery disease)    a. 10/2021 NSTEMI/PCI: LM nl, LAD min irregs, LCX 33m(2.5x18 Onyx Frontier DES), OM1 nl, RCA 913m3.5x26 Onyx Frontier DES), 30d.   Diastolic dysfunction    a. 10/2021 Echo: EF 50-55%, no rwma, Gr1 DD, nl RV fxn. No significant valvular dzs.   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 SCCis. MOHs 07/08/22   Past Surgical History:  Procedure Laterality Date   COLONOSCOPY N/A 10/09/2015   Procedure: COLONOSCOPY;  Surgeon: PaHulen LusterMD;  [facility];  Service: Gastroenterology;  Laterality: N/A;   CORONARY STENT INTERVENTION N/A 10/22/2021   Procedure: CORONARY STENT INTERVENTION;  Surgeon: ArWellington HampshireMD;  [facility];  Service: Cardiovascular;  Laterality: N/A;   IR CT HEAD LTD  05/21/2022   IR PERCUTANEOUS ART THROMBECTOMY/INFUSION INTRACRANIAL INC DIAG ANGIO  05/21/2022   LEFT HEART CATH AND CORONARY ANGIOGRAPHY N/A 10/22/2021   Procedure: LEFT HEART CATH AND CORONARY ANGIOGRAPHY;  Surgeon: ArWellington HampshireMD;  [facility];  Service: Cardiovascular;  Laterality:  N/A;   LOOP RECORDER INSERTION N/A 05/23/2022   Procedure: LOOP RECORDER INSERTION;  Surgeon: LaVickie EpleyMD;  [place] [facility];  Service: Cardiovascular;  Laterality: N/A;   RADIOLOGY WITH ANESTHESIA N/A 05/20/2022   Procedure: IR WITH ANESTHESIA;  Surgeon: DeLuanne BrasMD;  [place] [facility] Service: Radiology;  Laterality: N/A;   ROTATOR CUFF REPAIR     Patient Active Problem List   Diagnosis Date Noted   Stroke (cerebrum) (HCTeas[place]10/10/2021   Middle cerebral artery embolism, 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 male with PMH: HTN, CAD, HLD, osteoarthritis, SCC, sleep apnea. He presented to the hospital on 05/20/22 with right sided weakness and aphasia with LKW reported by wife to be 10pm on 10/2.  [facility]/CTA which demonstrated left M1 occlusion. He received IV tenecteplase and IR thrombectomy due to large  vessel occlusion.       DIAGNOSTIC FINDINGS:  MRI 05/22/2022 Cortical ischemia throughout the left MCA territory compatible with recent infarct. Relatively little affect on the white matter. 2. Small focus of acute hemorrhage along the anterior surface of the left temporal lobe Heidelberg classification 3c: Subarachnoid hemorrhage. 3. Normal intracranial MRA.  Restored flow in left MCA.    THERAPY DIAG:  Aphasia  Apraxia  Middle cerebral artery embolism, left  Rationale for Evaluation and Treatment Rehabilitation  SUBJECTIVE: pt and his wife are enjoying having company visit from out of town  Pt accompanied  by: self and significant other  PAIN:  Are you having pain? No  PATIENT GOALS: to be able to communicate again  OBJECTIVE:   TODAY'S TREATMENT: Skilled treatment session focused on pt's communication goals. SLP facilitated session by providing the following interventions:  Pt's wife reports that they ate out in a restaurant for there first time. She reports that pt read the menu and pointed to the dish that he wanted as well as answered waitress' questions appropriately.   Targeted production of his nephew's name. Pt continues to benefit from visual articulatory cues to initiate accurate motor movement.   To facilitate functional communication during social situations, SLP introduced provided instruction in using non-language based games such as CONNECT 4. SLP instructed pt in CONNECT 4. Pt with great understanding and ability to play game. More importantly while playing the game, pt engaged in comments that contained more words, more variety of words with 100% speech intelligibility. Pt required less assistance to name colors of checkers. Pt's wife was very observant and voiced understanding how to elicit more functional conversation.        PATIENT EDUCATION: Education details: see above Person educated: Patient and Wife Education method: Explanation, Media planner, Verbal cues, and Handouts Education comprehension: verbalized understanding  HOME EXERCISE PROGRAM:  Complete worksheets made by SLP targeting basic objects  GOALS: Goals reviewed with patient? Yes  SHORT TERM GOALS: Target date: 10 sessions  UPDATED - 06/24/2022  UPDATED - 07/19/2022 Given moderate assistance, pt will answer simple basic yes/no questions with 50% accuracy.  Baseline: 25% Goal status: MET UPDATED goal: Given minimal assistance, pt will answer simple basic yes/no questions with 75% accuracy.  Goal status: MET   2.  Given moderate assistance, pt will follow simple basic 1 step directions with 50%  accuracy.  Baseline: 0% Goal status: MET  UPDATED goal: Given minimal assistance, pt will following simple basic 1 step directions with 75% accuracy.   Goal status: MET   3.  With maximal multimodal cues, pt will name set of 10 common objects in 8 out of 10 opportunities.  Baseline: unable Goal status: MET UPDATED goal: With moderate cues, pt will name set of 10 common objects in 8 out of 10 opportunities.  Goal status: MET UPDATED goal: With minimal cues, pt will name set of 10 common objects in 8 out of 10 opportunities.    4. Pt will improve reading abilities by placing phrase level object function with object in field of 3 with 50% ability and moderate cues.              Baseline: object name to object - 90%             Goal status: MET  UPDATED goal: Pt will demonstrate reading comprehension at the sentence level for basic information with > 75% accuracy and min A cues.   Goal status: ONGOING   5.  Pt will improve spelling abilities by completing common words by filling in missing letter with 50% ability and moderate cues.              Baseline: unable             Goal status: MET UPDATED goal: Pt will improve spelling abilities by completing common words by filling in missing letters with >90% accuracy and rare min A cues.  Goal status: MET UPDATED goal: Pt will improve spelling ability by writing names of basic objects with supervision level cues in 8 out of 10 opportunities.    6. Pt will repeat rote phrases with 25% speech intelligibility given max to moderate cues.              Baseline: unable             Goal status: MET  UPDATED goal: Pt will repeat rote phrases with ~ 75% speech intelligibility given minimal cues.   Goal status: ONGOING    LONG TERM GOALS: Target date: 08/26/2021   Pt will use multimodal communication to express basic wants/needs in 4 of 10 opportunities.  Baseline: unable Goal status: ONGOING   2.  Pt will demonstrate increased reading  comprehension by selecting accurate answer from 3 choices in 2 out of 10 opportunities.  Baseline: unable Goal status: ONGOING   3.  Pt will correctly spell his name in 5 out of 10 opportunities.  Baseline: unable Goal status: ONGOING  ASSESSMENT:  CLINICAL IMPRESSION: Pt continues to be motivated as evidenced by resisting urge to call for Lotto numbers.   OBJECTIVE IMPAIRMENTS include expressive language, receptive language, aphasia, and apraxia. These impairments are limiting patient from managing medications, managing appointments, managing finances, household responsibilities, ADLs/IADLs, and effectively communicating at home and in community. Factors affecting potential to achieve goals and functional outcome are severity of impairments. Patient will benefit from skilled SLP services to address above impairments and improve overall function.  REHAB POTENTIAL: Excellent  PLAN: SLP FREQUENCY: 3x/week  SLP DURATION: 12 weeks  PLANNED INTERVENTIONS: Language facilitation, Environmental controls, Internal/external aids, Functional tasks, Multimodal communication approach, SLP instruction and feedback, Compensatory strategies, and Patient/family education   Quasean Frye B. Rutherford Nail, M.S., CCC-SLP, Mining engineer Certified Brain Injury [place]  [place] Office [phone] Ascom [phone] Fax [phone]

## 2022-08-09 ENCOUNTER — Encounter: Payer: Medicare HMO | Admitting: Speech Pathology

## 2022-08-13 ENCOUNTER — Ambulatory Visit: Payer: Medicare HMO

## 2022-08-13 ENCOUNTER — Encounter: Payer: Medicare HMO | Admitting: Occupational Therapy

## 2022-08-15 ENCOUNTER — Ambulatory Visit: Payer: Medicare HMO | Admitting: Speech Pathology

## 2022-08-15 DIAGNOSIS — R4701 Aphasia: Secondary | ICD-10-CM | POA: Diagnosis not present

## 2022-08-15 DIAGNOSIS — I6602 Occlusion and stenosis of left middle cerebral artery: Secondary | ICD-10-CM

## 2022-08-15 DIAGNOSIS — R482 Apraxia: Secondary | ICD-10-CM

## 2022-08-15 NOTE — Therapy (Addendum)
OUTPATIENT SPEECH LANGUAGE PATHOLOGY TREATMENT NOTE   Patient Name: Alan Stewart MRN: [id number] DOB:18-Jul-1946, 76 y.o., male Today's Date: 08/15/2022     PCP: Ramonita Lab, MD REFERRING PROVIDER: Ramonita Lab, MD   END OF SESSION  End of Session - 08/15/22 0949     Visit Number 28    Number of Visits 22    Date for SLP Re-Evaluation 10/25/22    Authorization Type [employer] H[employer]    Progress Note Due on Visit 55    SLP Start Time 1000    SLP Stop Time  1100    SLP Time Calculation (min) 60 min    Activity Tolerance Patient tolerated treatment well               Past Medical History:  Diagnosis Date   CAD (coronary artery disease)    a. 10/2021 NSTEMI/PCI: LM nl, LAD min irregs, LCX 67m(2.5x18 Onyx Frontier DES), OM1 nl, RCA 977m3.5x26 Onyx Frontier DES), 30d.   Diastolic dysfunction    a. 10/2021 Echo: EF 50-55%, no rwma, Gr1 DD, nl RV fxn. No significant valvular dzs.   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 SCCis. MOHs 07/08/22   Past Surgical History:  Procedure Laterality Date   COLONOSCOPY N/A 10/09/2015   Procedure: COLONOSCOPY;  Surgeon: PaHulen LusterMD;  [facility];  Service: Gastroenterology;  Laterality: N/A;   CORONARY STENT INTERVENTION N/A 10/22/2021   Procedure: CORONARY STENT INTERVENTION;  Surgeon: ArWellington HampshireMD;  [facility];  Service: Cardiovascular;  Laterality: N/A;   IR CT HEAD LTD  05/21/2022   IR PERCUTANEOUS ART THROMBECTOMY/INFUSION INTRACRANIAL INC DIAG ANGIO  05/21/2022   LEFT HEART CATH AND CORONARY ANGIOGRAPHY N/A 10/22/2021   Procedure: LEFT HEART CATH AND CORONARY ANGIOGRAPHY;  Surgeon: ArWellington HampshireMD;  [facility];  Service: Cardiovascular;  Laterality:  N/A;   LOOP RECORDER INSERTION N/A 05/23/2022   Procedure: LOOP RECORDER INSERTION;  Surgeon: LaVickie EpleyMD;  [facility];  Service: Cardiovascular;  Laterality: N/A;   RADIOLOGY WITH ANESTHESIA N/A 05/20/2022   Procedure: IR WITH ANESTHESIA;  Surgeon: DeLuanne BrasMD;  [place] [facility] Service: Radiology;  Laterality: N/A;   ROTATOR CUFF REPAIR     Patient Active Problem List   Diagnosis Date Noted   Stroke (cerebrum) ([facility]10/10/2021   Middle cerebral artery embolism, 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 male with PMH: HTN, CAD, HLD, osteoarthritis, SCC, sleep apnea. He presented to the hospital on 05/20/22 with right sided weakness and aphasia with LKW reported by wife to be 10pm on 10/2.  [facility] emergent CT/CTA which demonstrated left M1 occlusion. He received IV tenecteplase and IR thrombectomy due to large  vessel occlusion.       DIAGNOSTIC FINDINGS:  MRI 05/22/2022 Cortical ischemia throughout the left MCA territory compatible with recent infarct. Relatively little affect on the white matter. 2. Small focus of acute hemorrhage along the anterior surface of the left temporal lobe Heidelberg classification 3c: Subarachnoid hemorrhage. 3. Normal intracranial MRA.  Restored flow in left MCA.    THERAPY DIAG:  Aphasia  Apraxia  Middle cerebral artery embolism, left  Rationale for Evaluation and Treatment Rehabilitation  SUBJECTIVE: "he has been a little sour" - wife reports that he is getting stubborn about writing  stuff down  Pt accompanied by: self and significant other  PAIN:  Are you having pain? No  PATIENT GOALS: to be able to communicate again  OBJECTIVE:   TODAY'S TREATMENT: Skilled treatment session focused on pt's communication goals. SLP facilitated session by providing the following interventions:  Pt able to write down list of foods that he ate during holiday meals - with moderate verbal and visual articulatory cues, pt able to say each item with > 90% intelligibility   Per his wife, pt will ask his wife to follow him to kitchen and then gestures to object to aid in communication breakdown  While pt is occasionally using written information to aid in communication, he is using one word such as "blue" to communicate questions regarding recycling - education provided to pt to include additional words with SLP providing assistance to generate additional words to communicate the above information  SLP provided instruction on use of weekly calendar with printed names of ADLs provided made bed, vacuum, emptied dish washer, garbage day, recycling day, Alan Stewart work, Alan Stewart - pt required initial moderate assistance to select activity faded to Mod I; Minimal assistance to use written word for correct spelling    PATIENT EDUCATION: Education details: see above Person educated: Patient and Wife Education method: Consulting civil engineer, Media planner, Verbal cues, and Handouts Education comprehension: verbalized understanding  HOME EXERCISE PROGRAM:  Complete worksheets made by SLP targeting basic objects  GOALS: Goals reviewed with patient? Yes  SHORT TERM GOALS: Target date: 10 sessions  UPDATED - 06/24/2022  UPDATED - 07/19/2022 Given moderate assistance, pt will answer simple basic yes/no questions with 50% accuracy.  Baseline: 25% Goal status: MET UPDATED goal: Given minimal assistance, pt will answer simple basic yes/no questions with 75% accuracy.  Goal status: MET   2.  Given moderate  assistance, pt will follow simple basic 1 step directions with 50% accuracy.  Baseline: 0% Goal status: MET  UPDATED goal: Given minimal assistance, pt will following simple basic 1 step directions with 75% accuracy.   Goal status: MET   3.  With maximal multimodal cues, pt will name set of 10 common objects in 8 out of 10 opportunities.  Baseline: unable Goal status: MET UPDATED goal: With moderate cues, pt will name set of 10 common objects in 8 out of 10 opportunities.  Goal status: MET UPDATED goal: With minimal cues, pt will name set of 10 common objects in 8 out of 10 opportunities.    4. Pt will improve reading abilities by placing phrase level object function with object in field of 3 with 50% ability and moderate cues.              Baseline: object name to object - 90%             Goal status: MET  UPDATED goal: Pt will demonstrate reading comprehension at the sentence level for basic information  with > 75% accuracy and min A cues.   Goal status: ONGOING   5. Pt will improve spelling abilities by completing common words by filling in missing letter with 50% ability and moderate cues.              Baseline: unable             Goal status: MET UPDATED goal: Pt will improve spelling abilities by completing common words by filling in missing letters with >90% accuracy and rare min A cues.  Goal status: MET UPDATED goal: Pt will improve spelling ability by writing names of basic objects with supervision level cues in 8 out of 10 opportunities.    6. Pt will repeat rote phrases with 25% speech intelligibility given max to moderate cues.              Baseline: unable             Goal status: MET  UPDATED goal: Pt will repeat rote phrases with ~ 75% speech intelligibility given minimal cues.   Goal status: ONGOING    LONG TERM GOALS: Target date: 08/26/2021   Pt will use multimodal communication to express basic wants/needs in 4 of 10 opportunities.  Baseline: unable Goal  status: ONGOING   2.  Pt will demonstrate increased reading comprehension by selecting accurate answer from 3 choices in 2 out of 10 opportunities.  Baseline: unable Goal status: ONGOING   3.  Pt will correctly spell his name in 5 out of 10 opportunities.  Baseline: unable Goal status: ONGOING  ASSESSMENT:  CLINICAL IMPRESSION: Pt continues to present with improving expressive aphasia as evidenced by his wife report of using 3-4 words together during Christmas gathering with family. She also reports increased engagement with family members by playing previously instructed games with his grandson.   OBJECTIVE IMPAIRMENTS include expressive language, receptive language, aphasia, and apraxia. These impairments are limiting patient from managing medications, managing appointments, managing finances, household responsibilities, ADLs/IADLs, and effectively communicating at home and in community. Factors affecting potential to achieve goals and functional outcome are severity of impairments. Patient will benefit from skilled SLP services to address above impairments and improve overall function.  REHAB POTENTIAL: Excellent  PLAN: SLP FREQUENCY: 3x/week  SLP DURATION: 12 weeks  PLANNED INTERVENTIONS: Language facilitation, Environmental controls, Internal/external aids, Functional tasks, Multimodal communication approach, SLP instruction and feedback, Compensatory strategies, and Patient/family education   Alan Stewart B. Rutherford Nail, M.S., CCC-SLP, Mining engineer Certified Brain Injury [place]  [place] Office [phone] Ascom [phone] Fax [phone]

## 2022-08-16 ENCOUNTER — Ambulatory Visit: Payer: Medicare HMO | Admitting: Speech Pathology

## 2022-08-16 ENCOUNTER — Encounter: Payer: Medicare HMO | Admitting: Occupational Therapy

## 2022-08-16 DIAGNOSIS — R4701 Aphasia: Secondary | ICD-10-CM

## 2022-08-16 DIAGNOSIS — I6602 Occlusion and stenosis of left middle cerebral artery: Secondary | ICD-10-CM

## 2022-08-16 DIAGNOSIS — R482 Apraxia: Secondary | ICD-10-CM

## 2022-08-16 NOTE — Therapy (Addendum)
OUTPATIENT SPEECH LANGUAGE PATHOLOGY TREATMENT NOTE   Patient Name: Alan Stewart MRN: [id number] DOB:16-Mar-1946, 76 y.o., male Today's Date: 08/16/2022     PCP: Ramonita Lab, MD REFERRING PROVIDER: Ramonita Lab, MD   END OF SESSION  End of Session - 08/16/22 0956     Visit Number 29   Number of Visits 13   Date for SLP Re-Evaluation 10/25/22    Authorization Type [employer] HMO    Progress Note Due on Visit 20    SLP Start Time 1000    SLP Stop Time  1100    SLP Time Calculation (min) 60 min    Activity Tolerance Patient tolerated treatment well               Past Medical History:  Diagnosis Date   CAD (coronary artery disease)    a. 10/2021 NSTEMI/PCI: LM nl, LAD min irregs, LCX 64m(2.5x18 Onyx Frontier DES), OM1 nl, RCA 941m3.5x26 Onyx Frontier DES), 30d.   Diastolic dysfunction    a. 10/2021 Echo: EF 50-55%, no rwma, Gr1 DD, nl RV fxn. No significant valvular dzs.   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 SCCis. MOHs 07/08/22   Past Surgical History:  Procedure Laterality Date   COLONOSCOPY N/A 10/09/2015   Procedure: COLONOSCOPY;  Surgeon: PaHulen LusterMD;  [facility];  Service: Gastroenterology;  Laterality: N/A;   CORONARY STENT INTERVENTION N/A 10/22/2021   Procedure: CORONARY STENT INTERVENTION;  Surgeon: ArWellington HampshireMD;  [facility];  Service: Cardiovascular;  Laterality: N/A;   IR CT HEAD LTD  05/21/2022   IR PERCUTANEOUS ART THROMBECTOMY/INFUSION INTRACRANIAL INC DIAG ANGIO  05/21/2022   LEFT HEART CATH AND CORONARY ANGIOGRAPHY N/A 10/22/2021   Procedure: LEFT HEART CATH AND CORONARY ANGIOGRAPHY;  Surgeon: ArWellington HampshireMD;  [facility];  Service: Cardiovascular;  Laterality: N/A;    LOOP RECORDER INSERTION N/A 05/23/2022   Procedure: LOOP RECORDER INSERTION;  Surgeon: LaVickie EpleyMD;  [facility];  Service: Cardiovascular;  Laterality: N/A;   RADIOLOGY WITH ANESTHESIA N/A 05/20/2022   Procedure: IR WITH ANESTHESIA;  Surgeon: DeLuanne BrasMD;  [place] [facility] Service: Radiology;  Laterality: N/A;   ROTATOR CUFF REPAIR     Patient Active Problem List   Diagnosis Date Noted   Stroke (cerebrum) ([place]10/10/2021   Middle cerebral artery embolism, 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 male with PMH: HTN, CAD, HLD, osteoarthritis, SCC, sleep apnea. He presented to the hospital on 05/20/22 with right sided weakness and aphasia with LKW reported by wife to be 10pm on 10/2.  [facility]/CTA which demonstrated left M1 occlusion. He received IV tenecteplase and IR thrombectomy due to large vessel occlusion.  DIAGNOSTIC FINDINGS:  MRI 05/22/2022 Cortical ischemia throughout the left MCA territory compatible with recent infarct. Relatively little affect on the white matter. 2. Small focus of acute hemorrhage along the anterior surface of the left temporal lobe Heidelberg classification 3c: Subarachnoid hemorrhage. 3. Normal intracranial MRA.  Restored flow in left MCA.    THERAPY DIAG:  Aphasia  Apraxia  Middle cerebral artery embolism, left  Rationale for Evaluation and Treatment Rehabilitation  SUBJECTIVE: "he has been a little sour" - wife reports that he is getting stubborn about writing stuff  down  Pt accompanied by: self and significant other  PAIN:  Are you having pain? No  PATIENT GOALS: to be able to communicate again  OBJECTIVE:   TODAY'S TREATMENT: Skilled treatment session focused on pt's communication goals. SLP facilitated session by providing the following interventions:  Targeted social interactions specifically reciprocal "good morning" and "how are you?" Pt benefited from visual and tactile prompts for turn taking and response to question. After 2 examples, pt able to understand pragmatic language and take turns with greeting as appropriate.   Pt required initial max multimodal assistance to produce an intelligible response "good" vs "good morning" - pt able to progress to Mod I with this greeting intelligibly  SLP reviewed progress towards goals and creation of new set of goals for upcoming progress note    PATIENT EDUCATION: Education details: see above Person educated: Patient and Wife Education method: Explanation, Demonstration, Verbal cues, and Handouts Education comprehension: verbalized understanding  HOME EXERCISE PROGRAM:  Complete worksheets made by SLP targeting basic objects  GOALS: Goals reviewed with patient? Yes  SHORT TERM GOALS: Target date: 10 sessions  UPDATED - 06/24/2022  UPDATED - 07/19/2022 Given moderate assistance, pt will answer simple basic yes/no questions with 50% accuracy.  Baseline: 25% Goal status: MET UPDATED goal: Given minimal assistance, pt will answer simple basic yes/no questions with 75% accuracy.  Goal status: MET   2.  Given moderate assistance, pt will follow simple basic 1 step directions with 50% accuracy.  Baseline: 0% Goal status: MET  UPDATED goal: Given minimal assistance, pt will following simple basic 1 step directions with 75% accuracy.   Goal status: MET   3.  With maximal multimodal cues, pt will name set of 10 common objects in 8 out of 10 opportunities.  Baseline: unable Goal status:  MET UPDATED goal: With moderate cues, pt will name set of 10 common objects in 8 out of 10 opportunities.  Goal status: MET UPDATED goal: With minimal cues, pt will name set of 10 common objects in 8 out of 10 opportunities.    4. Pt will improve reading abilities by placing phrase level object function with object in field of 3 with 50% ability and moderate cues.              Baseline: object name to object - 90%             Goal status: MET  UPDATED goal: Pt will demonstrate reading comprehension at the sentence level for basic information with > 75% accuracy and min A cues.   Goal status: ONGOING   5. Pt will improve spelling abilities by completing common words by filling in missing letter with 50% ability and moderate cues.              Baseline: unable             Goal status: MET UPDATED goal: Pt will improve spelling abilities by completing  common words by filling in missing letters with >90% accuracy and rare min A cues.  Goal status: MET UPDATED goal: Pt will improve spelling ability by writing names of basic objects with supervision level cues in 8 out of 10 opportunities.    6. Pt will repeat rote phrases with 25% speech intelligibility given max to moderate cues.              Baseline: unable             Goal status: MET  UPDATED goal: Pt will repeat rote phrases with ~ 75% speech intelligibility given minimal cues.   Goal status: ONGOING    LONG TERM GOALS: Target date: 08/26/2021   Pt will use multimodal communication to express basic wants/needs in 4 of 10 opportunities.  Baseline: unable Goal status: ONGOING   2.  Pt will demonstrate increased reading comprehension by selecting accurate answer from 3 choices in 2 out of 10 opportunities.  Baseline: unable Goal status: ONGOING   3.  Pt will correctly spell his name in 5 out of 10 opportunities.  Baseline: unable Goal status: ONGOING  ASSESSMENT:  CLINICAL IMPRESSION: Pt continues to present with improving  expressive aphasia as evidenced by his wife report of using 3-4 words together during Christmas gathering with family. She also reports increased engagement with family members by playing previously instructed games with his grandson.   OBJECTIVE IMPAIRMENTS include expressive language, receptive language, aphasia, and apraxia. These impairments are limiting patient from managing medications, managing appointments, managing finances, household responsibilities, ADLs/IADLs, and effectively communicating at home and in community. Factors affecting potential to achieve goals and functional outcome are severity of impairments. Patient will benefit from skilled SLP services to address above impairments and improve overall function.  REHAB POTENTIAL: Excellent  PLAN: SLP FREQUENCY: 3x/week  SLP DURATION: 12 weeks  PLANNED INTERVENTIONS: Language facilitation, Environmental controls, Internal/external aids, Functional tasks, Multimodal communication approach, SLP instruction and feedback, Compensatory strategies, and Patient/family education   Pleasant Britz B. Rutherford Nail, M.S., CCC-SLP, Mining engineer Certified Brain Injury [place]  [place] Office [phone] Ascom [phone] Fax [phone]

## 2022-08-20 ENCOUNTER — Ambulatory Visit: Payer: Medicare HMO | Attending: Internal Medicine | Admitting: Speech Pathology

## 2022-08-20 DIAGNOSIS — R482 Apraxia: Secondary | ICD-10-CM | POA: Diagnosis not present

## 2022-08-20 DIAGNOSIS — I6602 Occlusion and stenosis of left middle cerebral artery: Secondary | ICD-10-CM | POA: Diagnosis not present

## 2022-08-20 DIAGNOSIS — R4701 Aphasia: Secondary | ICD-10-CM | POA: Diagnosis not present

## 2022-08-20 NOTE — Therapy (Signed)
OUTPATIENT SPEECH LANGUAGE PATHOLOGY TREATMENT NOTE  10th VISIT PROGRESS NOTE   Patient Name: Alan Stewart MRN: [id number] DOB:02/11/1946, 77 y.o., male Today's Date: 08/20/2022   PCP: Ramonita Lab, MD REFERRING PROVIDER: Ramonita Lab, MD  Speech Therapy Progress Note  Dates of Reporting Period: 07/23/2022 to 08/20/2022  Objective: Patient has been seen for 10 speech therapy sessions this reporting period targeting his severe impairment in expressive communication, mild receptive language deficits, moderate spelling and reading impairment. Patient is making progress toward LTGs as well as updated STGs this reporting period. See skilled intervention, clinical impressions, and goals below for details.  END OF SESSION  End of Session - 08/20/22 0908     Visit Number 30    Number of Visits 45   Date for SLP Re-Evaluation 10/25/22    Authorization Type [employer] HMO    Progress Note Due on Visit 78    SLP Start Time 0900    SLP Stop Time  1000    SLP Time Calculation (min) 60 min    Activity Tolerance Patient tolerated treatment well               Past Medical History:  Diagnosis Date   CAD (coronary artery disease)    a. 10/2021 NSTEMI/PCI: LM nl, LAD min irregs, LCX 46m(2.5x18 Onyx Frontier DES), OM1 nl, RCA 947m3.5x26 Onyx Frontier DES), 30d.   Diastolic dysfunction    a. 10/2021 Echo: EF 50-55%, no rwma, Gr1 DD, nl RV fxn. No significant valvular dzs.   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 SCCis. MOHs 07/08/22   Past Surgical History:  Procedure Laterality Date   COLONOSCOPY N/A 10/09/2015   Procedure: COLONOSCOPY;  Surgeon: PaHulen LusterMD;  [facility];  Service: Gastroenterology;  Laterality: N/A;   CORONARY STENT INTERVENTION N/A  10/22/2021   Procedure: CORONARY STENT INTERVENTION;  Surgeon: ArWellington HampshireMD;  [facility];  Service: Cardiovascular;  Laterality: N/A;   IR CT HEAD LTD  05/21/2022   IR PERCUTANEOUS ART THROMBECTOMY/INFUSION INTRACRANIAL INC DIAG ANGIO  05/21/2022   LEFT HEART CATH AND CORONARY ANGIOGRAPHY N/A 10/22/2021   Procedure: LEFT HEART CATH AND CORONARY ANGIOGRAPHY;  Surgeon: ArWellington HampshireMD;  [facility];  Service: Cardiovascular;  Laterality: N/A;   LOOP RECORDER INSERTION N/A 05/23/2022   Procedure: LOOP RECORDER INSERTION;  Surgeon: LaVickie EpleyMD;  [facility];  Service: Cardiovascular;  Laterality: N/A;   RADIOLOGY WITH ANESTHESIA N/A 05/20/2022   Procedure: IR WITH ANESTHESIA;  Surgeon: DeLuanne BrasMD;  [facility]: [facility] Service: Radiology;  Laterality: N/A;   ROTATOR CUFF REPAIR     Patient Active Problem List   Diagnosis Date Noted   Stroke (cerebrum) ([facility]/10/2021   Middle cerebral artery embolism, 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 male with PMH: HTN, CAD, HLD, osteoarthritis, SCC, sleep apnea. He presented to the hospital on 05/20/22 with right sided weakness and aphasia with LKW reported by wife to be 10pm on 10/2.  He arrived at [facility] as code stroke and taken for emergent CT/CTA which demonstrated left M1 occlusion. He received IV tenecteplase and IR thrombectomy due to large vessel occlusion.       DIAGNOSTIC FINDINGS:  MRI 05/22/2022 Cortical ischemia throughout the left MCA territory compatible with recent  infarct. Relatively little affect on the white matter. 2. Small focus of acute hemorrhage along the anterior surface of the left temporal lobe Heidelberg classification 3c: Subarachnoid hemorrhage. 3. Normal intracranial MRA.  Restored flow in left MCA.    THERAPY DIAG:  Aphasia  Apraxia  Middle cerebral artery embolism, left  Rationale for Evaluation and Treatment Rehabilitation  SUBJECTIVE: "we have been playing a lot of games and laughing a lot"   Pt accompanied by: self and significant other  PAIN:  Are you having pain? No  PATIENT GOALS: to be able to communicate again  OBJECTIVE:   TODAY'S TREATMENT: Skilled treatment session focused on pt's communication goals. SLP facilitated session by providing the following interventions:  With rare Min A cues to write down information, pt was able to use written words to effectively communicate information/answers to some basic simple questions within a known context  Reading comprehension targeted using the [facility] 11: Language for Home Activities; Word Finding - Matching Words to Definitions pp 18-20 Pt required continued cues to read all of the choices as he frequently stopped mid page - with cues, pt able to select appropriate choices with 80% accuracy  [facility] 11: Language for home Activities - Organization page 34 "Unscrambling Sentences" - Pt unable to unscramble words to form a sentence. Pt was aware that his formulation didn't make sense but was not able to improve upon it. When provided with 3 SLP generated sentences, pt able to select correct sentence with 75% accuracy, increased to 90% accuracy when cued to read all of the choices.   Writing was targeted thru generation of DOW within his calendar as well as copying appropriate list of activities.      PATIENT EDUCATION: Education details: see above Person educated: Patient and Wife Education method: Explanation, Media planner, Verbal cues, and Handouts Education  comprehension: verbalized understanding  HOME EXERCISE PROGRAM:  Complete calendar each day GOALS: Goals reviewed with patient? Yes  SHORT TERM GOALS: Target date: 10 sessions  UPDATED - 06/24/2022  UPDATED - 07/19/2022  UPDATED - 08/20/2022 Given moderate assistance, pt will answer simple basic yes/no questions with 50% accuracy.  Baseline: 25% Goal status: MET UPDATED goal: Given minimal assistance, pt will answer simple basic yes/no questions with 75% accuracy.  Goal status: MET   2.  Given moderate assistance, pt will follow simple basic 1 step directions with 50% accuracy.  Baseline: 0% Goal status: MET  UPDATED goal: Given minimal assistance, pt will following simple basic 1 step directions with 75% accuracy.   Goal status: MET   3.  With maximal multimodal cues, pt will name set of 10 common objects in 8 out of 10 opportunities.  Baseline: unable Goal status: MET UPDATED goal: With moderate cues, pt will name set of 10 common objects in 8 out of 10 opportunities.  Goal status: MET UPDATED goal: With minimal cues, pt will name set of 10 common objects in 8 out of 10 opportunities.  Goal  status: ongoing   4. Pt will improve reading abilities by placing phrase level object function with object in field of 3 with 50% ability and moderate cues.              Baseline: object name to object - 90%             Goal status: MET  UPDATED goal: Pt will demonstrate reading comprehension at the sentence level for basic information with > 75% accuracy and min A cues.   Goal status: ONGOING  Goal status: ONGOING   5. Pt will improve spelling abilities by completing common words by filling in missing letter with 50% ability and moderate cues.              Baseline: unable             Goal status: MET UPDATED goal: Pt will improve spelling abilities by completing common words by filling in missing letters with >90% accuracy and rare min A cues.  Goal status: MET UPDATED goal: Pt will  improve spelling ability by writing names of basic objects with supervision level cues in 8 out of 10 opportunities.  Goal status: ONGOING   6. Pt will repeat rote phrases with 25% speech intelligibility given max to moderate cues.              Baseline: unable             Goal status: MET  UPDATED goal: Pt will repeat rote phrases with ~ 75% speech intelligibility given minimal cues.   Goal status: ONGOING  Goal status: ONGOING    LONG TERM GOALS: Target date: 10/25/2022   Pt will use multimodal communication to express basic wants/needs in 4 of 10 opportunities.  Baseline: unable Goal status: ONGOING   2.  Pt will demonstrate increased reading comprehension by selecting accurate answer from 3 choices in 2 out of 10 opportunities.  Baseline: unable Goal status: ONGOING   3.  Pt will correctly spell his name in 5 out of 10 opportunities.  Baseline: unable Goal status: ONGOING  ASSESSMENT:  CLINICAL IMPRESSION: Pt continues to make progress towards his goals with improved expressive communication noted during functional activities   OBJECTIVE IMPAIRMENTS include expressive language, receptive language, aphasia, and apraxia. These impairments are limiting patient from managing medications, managing appointments, managing finances, household responsibilities, ADLs/IADLs, and effectively communicating at home and in community. Factors affecting potential to achieve goals and functional outcome are severity of impairments. Patient will benefit from skilled SLP services to address above impairments and improve overall function.  REHAB POTENTIAL: Excellent  PLAN: SLP FREQUENCY: 3x/week  SLP DURATION: 12 weeks  PLANNED INTERVENTIONS: Language facilitation, Environmental controls, Internal/external aids, Functional tasks, Multimodal communication approach, SLP instruction and feedback, Compensatory strategies, and Patient/family education   [place] Current B. Rutherford Nail, M.S., CCC-SLP,  Mining engineer Certified Brain Injury [place]  [place] Office [phone] Ascom [phone] Fax [phone]

## 2022-08-21 ENCOUNTER — Ambulatory Visit: Payer: Medicare HMO | Admitting: Speech Pathology

## 2022-08-21 DIAGNOSIS — R4701 Aphasia: Secondary | ICD-10-CM | POA: Diagnosis not present

## 2022-08-21 DIAGNOSIS — R482 Apraxia: Secondary | ICD-10-CM | POA: Diagnosis not present

## 2022-08-21 DIAGNOSIS — I6602 Occlusion and stenosis of left middle cerebral artery: Secondary | ICD-10-CM

## 2022-08-21 NOTE — Therapy (Incomplete)
OUTPATIENT SPEECH LANGUAGE PATHOLOGY TREATMENT NOTE   Patient Name: Alan Stewart MRN: [id number] DOB:1945-09-30, 77 y.o., male Today's Date: 08/21/2022     PCP: Ramonita Lab, MD REFERRING PROVIDER: Ramonita Lab, MD   END OF SESSION  End of Session - 08/21/22 1448     Visit Number 31    Number of Visits 73    Date for SLP Re-Evaluation 10/25/22    Authorization Type [employer] HMO    Progress Note Due on Visit 62    SLP Start Time 1000    SLP Stop Time  1100    SLP Time Calculation (min) 60 min    Activity Tolerance Patient tolerated treatment well               Past Medical History:  Diagnosis Date   CAD (coronary artery disease)    a. 10/2021 NSTEMI/PCI: LM nl, LAD min irregs, LCX 62m(2.5x18 Onyx Frontier DES), OM1 nl, RCA 967m3.5x26 Onyx Frontier DES), 30d.   Diastolic dysfunction    a. 10/2021 Echo: EF 50-55%, no rwma, Gr1 DD, nl RV fxn. No significant valvular dzs.   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 SCCis. MOHs 07/08/22   Past Surgical History:  Procedure Laterality Date   COLONOSCOPY N/A 10/09/2015   Procedure: COLONOSCOPY;  Surgeon: PaHulen LusterMD;  [facility];  Service: Gastroenterology;  Laterality: N/A;   CORONARY STENT INTERVENTION N/A 10/22/2021   Procedure: CORONARY STENT INTERVENTION;  Surgeon: ArWellington HampshireMD;  [facility];  Service: Cardiovascular;  Laterality: N/A;   IR CT HEAD LTD  05/21/2022   IR PERCUTANEOUS ART THROMBECTOMY/INFUSION INTRACRANIAL INC DIAG ANGIO  05/21/2022   LEFT HEART CATH AND CORONARY ANGIOGRAPHY N/A 10/22/2021   Procedure: LEFT HEART CATH AND CORONARY ANGIOGRAPHY;  Surgeon: ArWellington HampshireMD;  [facility];  Service: Cardiovascular;  Laterality: N/A;    LOOP RECORDER INSERTION N/A 05/23/2022   Procedure: LOOP RECORDER INSERTION;  Surgeon: LaVickie EpleyMD;  [facility];  Service: Cardiovascular;  Laterality: N/A;   RADIOLOGY WITH ANESTHESIA N/A 05/20/2022   Procedure: IR WITH ANESTHESIA;  Surgeon: DeLuanne BrasMD;  [facility] Service: Radiology;  Laterality: N/A;   ROTATOR CUFF REPAIR     Patient Active Problem List   Diagnosis Date Noted   Stroke (cerebrum) ([facility]10/10/2021   Middle cerebral artery embolism, 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 male with PMH: HTN, CAD, HLD, osteoarthritis, SCC, sleep apnea. He presented to the hospital on 05/20/22 with right sided weakness and aphasia with LKW reported by wife to be 10pm on 10/2.  [facility] emergent CT/CTA which demonstrated left M1 occlusion. He received IV tenecteplase and IR thrombectomy due to large  vessel occlusion.       DIAGNOSTIC FINDINGS:  MRI 05/22/2022 Cortical ischemia throughout the left MCA territory compatible with recent infarct. Relatively little affect on the white matter. 2. Small focus of acute hemorrhage along the anterior surface of the left temporal lobe Heidelberg classification 3c: Subarachnoid hemorrhage. 3. Normal intracranial MRA.  Restored flow in left MCA.    THERAPY DIAG:  Aphasia  Apraxia  Middle cerebral artery embolism, left  Rationale for Evaluation and Treatment Rehabilitation  SUBJECTIVE: "he has been a little sour" - wife reports that he is getting stubborn about writing stuff  down  Pt accompanied by: self and significant other  PAIN:  Are you having pain? No  PATIENT GOALS: to be able to communicate again  OBJECTIVE:   TODAY'S TREATMENT: Skilled treatment session focused on pt's communication goals. SLP facilitated session by providing the following interventions:  Targeted creation of calendar with minimal assistance required for pt to state requested name of day. Pt able to select from list of printed activities and then write with accurate spelling the chosen activity. SLP facilitated signing on pt's MyChart. Pt unable to write or say his SSN.   Pt was independent stating the colors within a game and he was observed using novel sentence level utterances "Now what are we going to do here?" Referring to potential move within the game.     PATIENT EDUCATION: Education details: see above Person educated: Patient and Wife Education method: Explanation, Media planner, Verbal cues, and Handouts Education comprehension: verbalized understanding  HOME EXERCISE PROGRAM:  Complete worksheets made by SLP targeting basic objects  GOALS: Goals reviewed with patient? Yes  SHORT TERM GOALS: Target date: 10 sessions  UPDATED - 06/24/2022  UPDATED - 07/19/2022  UPDATED - 08/20/2022 Given moderate assistance, pt will answer simple basic yes/no questions with 50% accuracy.  Baseline: 25% Goal status: MET UPDATED goal: Given minimal assistance, pt will answer simple basic yes/no questions with 75% accuracy.  Goal status: MET   2.  Given moderate assistance, pt will follow simple basic 1 step directions with 50% accuracy.  Baseline: 0% Goal status: MET  UPDATED goal: Given minimal assistance, pt will following simple basic 1 step directions with 75% accuracy.   Goal status: MET   3.  With maximal multimodal cues, pt will name set of 10 common objects in 8 out of 10 opportunities.  Baseline: unable Goal status: MET UPDATED goal: With moderate cues, pt will  name set of 10 common objects in 8 out of 10 opportunities.  Goal status: MET UPDATED goal: With minimal cues, pt will name set of 10 common objects in 8 out of 10 opportunities.  Goal status: ongoing   4. Pt will improve reading abilities by placing phrase level object function with object in field of 3 with 50% ability and moderate cues.              Baseline: object name to object - 90%             Goal status: MET  UPDATED goal: Pt will demonstrate reading comprehension at the sentence level for basic information with > 75% accuracy and min A cues.   Goal status: ONGOING  Goal status: ONGOING   5. Pt will improve spelling abilities by completing common words by filling in missing letter with 50% ability and moderate cues.              Baseline: unable  Goal status: MET UPDATED goal: Pt will improve spelling abilities by completing common words by filling in missing letters with >90% accuracy and rare min A cues.  Goal status: MET UPDATED goal: Pt will improve spelling ability by writing names of basic objects with supervision level cues in 8 out of 10 opportunities.  Goal status: ONGOING   6. Pt will repeat rote phrases with 25% speech intelligibility given max to moderate cues.              Baseline: unable             Goal status: MET  UPDATED goal: Pt will repeat rote phrases with ~ 75% speech intelligibility given minimal cues.   Goal status: ONGOING  Goal status: ONGOING    LONG TERM GOALS: Target date: 10/25/2022   Pt will use multimodal communication to express basic wants/needs in 4 of 10 opportunities.  Baseline: unable Goal status: ONGOING   2.  Pt will demonstrate increased reading comprehension by selecting accurate answer from 3 choices in 2 out of 10 opportunities.  Baseline: unable Goal status: ONGOING   3.  Pt will correctly spell his name in 5 out of 10 opportunities.  Baseline: unable Goal status: ONGOING   ASSESSMENT:  CLINICAL  IMPRESSION: Pt continues to make progress and demonstrate understanding of calendar use. While he writes down information to help during moments of communication breakdown, he writes down one word and is heavily reliant on his wife to guess > 6 times.   OBJECTIVE IMPAIRMENTS include expressive language, receptive language, aphasia, and apraxia. These impairments are limiting patient from managing medications, managing appointments, managing finances, household responsibilities, ADLs/IADLs, and effectively communicating at home and in community. Factors affecting potential to achieve goals and functional outcome are severity of impairments. Patient will benefit from skilled SLP services to address above impairments and improve overall function.  REHAB POTENTIAL: Excellent  PLAN: SLP FREQUENCY: 3x/week  SLP DURATION: 12 weeks  PLANNED INTERVENTIONS: Language facilitation, Environmental controls, Internal/external aids, Functional tasks, Multimodal communication approach, SLP instruction and feedback, Compensatory strategies, and Patient/family education   Shadara Lopez B. Rutherford Nail, M.S., CCC-SLP, Mining engineer Certified Brain Injury [place]  [place] Office [phone] Ascom [phone] Fax [phone]

## 2022-08-22 DIAGNOSIS — E1165 Type 2 diabetes mellitus with hyperglycemia: Secondary | ICD-10-CM | POA: Diagnosis not present

## 2022-08-22 DIAGNOSIS — R972 Elevated prostate specific antigen [PSA]: Secondary | ICD-10-CM | POA: Diagnosis not present

## 2022-08-23 ENCOUNTER — Ambulatory Visit (INDEPENDENT_AMBULATORY_CARE_PROVIDER_SITE_OTHER): Payer: Medicare HMO

## 2022-08-23 ENCOUNTER — Ambulatory Visit: Payer: Medicare HMO

## 2022-08-23 ENCOUNTER — Encounter: Payer: Medicare HMO | Admitting: Occupational Therapy

## 2022-08-23 ENCOUNTER — Telehealth: Payer: Self-pay | Admitting: Speech Pathology

## 2022-08-23 ENCOUNTER — Ambulatory Visit: Payer: Medicare HMO | Attending: Internal Medicine | Admitting: Speech Pathology

## 2022-08-23 DIAGNOSIS — I63412 Cerebral infarction due to embolism of left middle cerebral artery: Secondary | ICD-10-CM

## 2022-08-23 LAB — CUP PACEART REMOTE DEVICE CHECK
Date Time Interrogation Session: 20240105073211
Implantable Pulse Generator Implant Date: 20231005
Pulse Gen Model: 450218
Pulse Gen Serial Number: 95054077

## 2022-08-23 NOTE — Addendum Note (Signed)
Addended by: Stormy Fabian B on: 08/23/2022 09:29 AM   Modules accepted: Orders

## 2022-08-23 NOTE — Telephone Encounter (Signed)
Pt was a "no show" today. I called his wife and left voicemail asking her to call me back to review future appts.   Amelia Burgard B. Rutherford Nail, M.S., CCC-SLP, Mining engineer Certified Brain Injury [place]  [place] Office [phone] Ascom [phone] Fax [phone]

## 2022-08-26 ENCOUNTER — Ambulatory Visit: Payer: Medicare HMO

## 2022-08-26 ENCOUNTER — Ambulatory Visit: Payer: Medicare HMO | Admitting: Speech Pathology

## 2022-08-26 ENCOUNTER — Encounter: Payer: Medicare HMO | Admitting: Occupational Therapy

## 2022-08-26 DIAGNOSIS — I6602 Occlusion and stenosis of left middle cerebral artery: Secondary | ICD-10-CM | POA: Diagnosis not present

## 2022-08-26 DIAGNOSIS — R4701 Aphasia: Secondary | ICD-10-CM

## 2022-08-26 DIAGNOSIS — R482 Apraxia: Secondary | ICD-10-CM | POA: Diagnosis not present

## 2022-08-27 DIAGNOSIS — I69920 Aphasia following unspecified cerebrovascular disease: Secondary | ICD-10-CM | POA: Diagnosis not present

## 2022-08-27 DIAGNOSIS — Z6841 Body Mass Index (BMI) 40.0 and over, adult: Secondary | ICD-10-CM | POA: Diagnosis not present

## 2022-08-27 DIAGNOSIS — E119 Type 2 diabetes mellitus without complications: Secondary | ICD-10-CM | POA: Diagnosis not present

## 2022-08-27 DIAGNOSIS — G4733 Obstructive sleep apnea (adult) (pediatric): Secondary | ICD-10-CM | POA: Diagnosis not present

## 2022-08-27 DIAGNOSIS — E039 Hypothyroidism, unspecified: Secondary | ICD-10-CM | POA: Diagnosis not present

## 2022-08-27 DIAGNOSIS — G4730 Sleep apnea, unspecified: Secondary | ICD-10-CM | POA: Diagnosis not present

## 2022-08-27 DIAGNOSIS — Z8673 Personal history of transient ischemic attack (TIA), and cerebral infarction without residual deficits: Secondary | ICD-10-CM | POA: Diagnosis not present

## 2022-08-27 DIAGNOSIS — I2510 Atherosclerotic heart disease of native coronary artery without angina pectoris: Secondary | ICD-10-CM | POA: Diagnosis not present

## 2022-08-27 DIAGNOSIS — D649 Anemia, unspecified: Secondary | ICD-10-CM | POA: Diagnosis not present

## 2022-08-28 ENCOUNTER — Ambulatory Visit: Payer: Medicare HMO | Admitting: Speech Pathology

## 2022-08-28 ENCOUNTER — Encounter: Payer: Medicare HMO | Admitting: Occupational Therapy

## 2022-08-28 ENCOUNTER — Ambulatory Visit: Payer: Medicare HMO

## 2022-08-28 DIAGNOSIS — R482 Apraxia: Secondary | ICD-10-CM | POA: Diagnosis not present

## 2022-08-28 DIAGNOSIS — I6602 Occlusion and stenosis of left middle cerebral artery: Secondary | ICD-10-CM

## 2022-08-28 DIAGNOSIS — R4701 Aphasia: Secondary | ICD-10-CM

## 2022-08-28 NOTE — Therapy (Signed)
OUTPATIENT SPEECH LANGUAGE PATHOLOGY TREATMENT NOTE   Patient Name: Alan Stewart MRN: [id number] DOB:02/10/1946, 77 y.o., male Today's Date: 08/28/2022      PCP: Ramonita Lab, MD REFERRING PROVIDER: Ramonita Lab, MD   END OF SESSION   End of Session - 08/28/22 1349     Visit Number 33    Number of Visits 52    Date for SLP Re-Evaluation 10/25/22    Authorization Type [employer] HMO    Progress Note Due on Visit 87    SLP Start Time 89    SLP Stop Time  1200    SLP Time Calculation (min) 60 min    Activity Tolerance Patient tolerated treatment well                    Past Medical History:  Diagnosis Date   CAD (coronary artery disease)    a. 10/2021 NSTEMI/PCI: LM nl, LAD min irregs, LCX 33m(2.5x18 Onyx Frontier DES), OM1 nl, RCA 979m3.5x26 Onyx Frontier DES), 30d.   Diastolic dysfunction    a. 10/2021 Echo: EF 50-55%, no rwma, Gr1 DD, nl RV fxn. No significant valvular dzs.   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 SCCis. MOHs 07/08/22   Past Surgical History:  Procedure Laterality Date   COLONOSCOPY N/A 10/09/2015   Procedure: COLONOSCOPY;  Surgeon: PaHulen LusterMD;  [facility];  Service: Gastroenterology;  Laterality: N/A;   CORONARY STENT INTERVENTION N/A 10/22/2021   Procedure: CORONARY STENT INTERVENTION;  Surgeon: ArWellington HampshireMD;  [facility];  Service: Cardiovascular;  Laterality: N/A;   IR CT HEAD LTD  05/21/2022   IR PERCUTANEOUS ART THROMBECTOMY/INFUSION INTRACRANIAL INC DIAG ANGIO  05/21/2022   LEFT HEART CATH AND CORONARY ANGIOGRAPHY N/A 10/22/2021   Procedure: LEFT HEART CATH AND CORONARY ANGIOGRAPHY;  Surgeon: ArWellington HampshireMD;  [facility];  Service: Cardiovascular;   Laterality: N/A;   LOOP RECORDER INSERTION N/A 05/23/2022   Procedure: LOOP RECORDER INSERTION;  Surgeon: LaVickie EpleyMD;  [place] [facility];  Service: Cardiovascular;  Laterality: N/A;   RADIOLOGY WITH ANESTHESIA N/A 05/20/2022   Procedure: IR WITH ANESTHESIA;  Surgeon: DeLuanne BrasMD;  [place] Service: Radiology;  Laterality: N/A;   ROTATOR CUFF REPAIR     Patient Active Problem List   Diagnosis Date Noted   Stroke (cerebrum) (HCAud[place]10/10/2021   Middle cerebral artery embolism, 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 male with PMH: HTN, CAD, HLD, osteoarthritis, SCC, sleep apnea. He presented to the hospital on 05/20/22 with right sided weakness and aphasia with LKW reported by wife to be 10pm on 10/2.  [facility] emergent CT/CTA which demonstrated left M1 occlusion. He received IV  tenecteplase and IR thrombectomy due to large vessel occlusion.       DIAGNOSTIC FINDINGS:  MRI 05/22/2022 Cortical ischemia throughout the left MCA territory compatible with recent infarct. Relatively little affect on the white matter. 2. Small focus of acute hemorrhage along the anterior surface of the left temporal lobe Heidelberg classification 3c: Subarachnoid hemorrhage. 3. Normal intracranial MRA.  Restored flow in left MCA.    THERAPY DIAG:  Aphasia  Apraxia  Middle cerebral artery embolism, left  Rationale for Evaluation and Treatment Rehabilitation  SUBJECTIVE: "he has been a little sour" - wife reports that he is getting stubborn  about writing stuff down  Pt accompanied by: self and significant other  PAIN:  Are you having pain? No  PATIENT GOALS: to be able to communicate again  OBJECTIVE:   TODAY'S TREATMENT: Skilled treatment session focused on pt's communication goals. SLP facilitated session by providing the following interventions:  Calendar had been filled out by patient. SLP facilitated reading words related to ADLs using sentence completion. Pt benefits from articulatory cues for DOW, MOY and physically counting up to date. Pt struggles with rote responses of letters requiring moderate assistance to bring awareness.   SLP further facilitated session by using VNEST to increase utterance length. Pt responded well with teaching. Will attempt to fade level of assistance in next session.     PATIENT EDUCATION: Education details: see above Person educated: Patient and Wife Education method: Explanation, Media planner, Verbal cues, and Handouts Education comprehension: verbalized understanding  HOME EXERCISE PROGRAM:  Complete calendar  GOALS: Goals reviewed with patient? Yes  SHORT TERM GOALS: Target date: 10 sessions  UPDATED - 06/24/2022  UPDATED - 07/19/2022  UPDATED - 08/20/2022 Given moderate assistance, pt will answer simple basic yes/no questions with 50% accuracy.  Baseline: 25% Goal status: MET UPDATED goal: Given minimal assistance, pt will answer simple basic yes/no questions with 75% accuracy.  Goal status: MET   2.  Given moderate assistance, pt will follow simple basic 1 step directions with 50% accuracy.  Baseline: 0% Goal status: MET  UPDATED goal: Given minimal assistance, pt will following simple basic 1 step directions with 75% accuracy.   Goal status: MET   3.  With maximal multimodal cues, pt will name set of 10 common objects in 8 out of 10 opportunities.  Baseline: unable Goal status: MET UPDATED goal: With moderate cues, pt will name set of 10 common objects in 8 out  of 10 opportunities.  Goal status: MET UPDATED goal: With minimal cues, pt will name set of 10 common objects in 8 out of 10 opportunities.  Goal status: ongoing   4. Pt will improve reading abilities by placing phrase level object function with object in field of 3 with 50% ability and moderate cues.              Baseline: object name to object - 90%             Goal status: MET  UPDATED goal: Pt will demonstrate reading comprehension at the sentence level for basic information with > 75% accuracy and min A cues.   Goal status: ONGOING  Goal status: ONGOING   5. Pt will improve spelling abilities by completing common words by filling in missing letter with 50% ability and moderate cues.              Baseline: unable             Goal status: MET UPDATED goal: Pt  will improve spelling abilities by completing common words by filling in missing letters with >90% accuracy and rare min A cues.  Goal status: MET UPDATED goal: Pt will improve spelling ability by writing names of basic objects with supervision level cues in 8 out of 10 opportunities.  Goal status: ONGOING   6. Pt will repeat rote phrases with 25% speech intelligibility given max to moderate cues.              Baseline: unable             Goal status: MET  UPDATED goal: Pt will repeat rote phrases with ~ 75% speech intelligibility given minimal cues.   Goal status: ONGOING  Goal status: ONGOING    LONG TERM GOALS: Target date: 10/25/2022   Pt will use multimodal communication to express basic wants/needs in 4 of 10 opportunities.  Baseline: unable Goal status: ONGOING   2.  Pt will demonstrate increased reading comprehension by selecting accurate answer from 3 choices in 2 out of 10 opportunities.  Baseline: unable Goal status: ONGOING   3.  Pt will correctly spell his name in 5 out of 10 opportunities.  Baseline: unable Goal status: ONGOING   ASSESSMENT:  CLINICAL IMPRESSION: Pt continues to make progress and  demonstrate understanding of calendar use. While he writes down information to help during moments of communication breakdown, he writes down one word and is heavily reliant on his wife to guess > 6 times.   OBJECTIVE IMPAIRMENTS include expressive language, receptive language, aphasia, and apraxia. These impairments are limiting patient from managing medications, managing appointments, managing finances, household responsibilities, ADLs/IADLs, and effectively communicating at home and in community. Factors affecting potential to achieve goals and functional outcome are severity of impairments. Patient will benefit from skilled SLP services to address above impairments and improve overall function.  REHAB POTENTIAL: Excellent  PLAN: SLP FREQUENCY: 3x/week  SLP DURATION: 12 weeks  PLANNED INTERVENTIONS: Language facilitation, Environmental controls, Internal/external aids, Functional tasks, Multimodal communication approach, SLP instruction and feedback, Compensatory strategies, and Patient/family education   Andrena Margerum B. Rutherford Nail, M.S., CCC-SLP, Mining engineer Certified Brain Injury [place]  [place] Office [phone] Ascom [phone] Fax [phone]

## 2022-08-28 NOTE — Therapy (Signed)
OUTPATIENT SPEECH LANGUAGE PATHOLOGY TREATMENT NOTE   Patient Name: Alan Stewart MRN: [id number] DOB:Jun 27, 1946, 77 y.o., male Today's Date: 08/26/2022     PCP: Ramonita Lab, MD REFERRING PROVIDER: Ramonita Lab, MD   END OF SESSION   End of Session - 08/28/22 0801     Visit Number 32    Number of Visits 64    Date for SLP Re-Evaluation 10/25/22    Authorization Type [employer] HMO    Progress Note Due on Visit 53    SLP Start Time 41    SLP Stop Time  1500    SLP Time Calculation (min) 60 min    Activity Tolerance Patient tolerated treatment well                    Past Medical History:  Diagnosis Date   CAD (coronary artery disease)    a. 10/2021 NSTEMI/PCI: LM nl, LAD min irregs, LCX 41m(2.5x18 Onyx Frontier DES), OM1 nl, RCA 922m3.5x26 Onyx Frontier DES), 30d.   Diastolic dysfunction    a. 10/2021 Echo: EF 50-55%, no rwma, Gr1 DD, nl RV fxn. No significant valvular dzs.   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 SCCis. MOHs 07/08/22   Past Surgical History:  Procedure Laterality Date   COLONOSCOPY N/A 10/09/2015   Procedure: COLONOSCOPY;  Surgeon: PaHulen LusterMD;  [facility];  Service: Gastroenterology;  Laterality: N/A;   CORONARY STENT INTERVENTION N/A 10/22/2021   Procedure: CORONARY STENT INTERVENTION;  Surgeon: ArWellington HampshireMD;  [place] [facility];  Service: Cardiovascular;  Laterality: N/A;   IR CT HEAD LTD  05/21/2022   IR PERCUTANEOUS ART THROMBECTOMY/INFUSION INTRACRANIAL INC DIAG ANGIO  05/21/2022   LEFT HEART CATH AND CORONARY ANGIOGRAPHY N/A 10/22/2021   Procedure: LEFT HEART CATH AND CORONARY ANGIOGRAPHY;  Surgeon: ArWellington HampshireMD;  [place] [facility];  Service: Cardiovascular;   Laterality: N/A;   LOOP RECORDER INSERTION N/A 05/23/2022   Procedure: LOOP RECORDER INSERTION;  Surgeon: LaVickie EpleyMD;  [place] [facility];  Service: Cardiovascular;  Laterality: N/A;   RADIOLOGY WITH ANESTHESIA N/A 05/20/2022   Procedure: IR WITH ANESTHESIA;  Surgeon: DeLuanne BrasMD;  [place] [facility] Service: Radiology;  Laterality: N/A;   ROTATOR CUFF REPAIR     Patient Active Problem List   Diagnosis Date Noted   Stroke (cerebrum) (HCAlb[place]10/10/2021   Middle cerebral artery embolism, 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 male with PMH: HTN, CAD, HLD, osteoarthritis, SCC, sleep apnea. He presented to the hospital on 05/20/22 with right sided weakness and aphasia with LKW reported by wife to be 10pm on 10/2.  [facility] emergent CT/CTA which demonstrated left M1 occlusion. He received IV tenecteplase  and IR thrombectomy due to large vessel occlusion.       DIAGNOSTIC FINDINGS:  MRI 05/22/2022 Cortical ischemia throughout the left MCA territory compatible with recent infarct. Relatively little affect on the white matter. 2. Small focus of acute hemorrhage along the anterior surface of the left temporal lobe Heidelberg classification 3c: Subarachnoid hemorrhage. 3. Normal intracranial MRA.  Restored flow in left MCA.    THERAPY DIAG:  Aphasia  Apraxia  Middle cerebral artery embolism, left  Rationale for Evaluation and Treatment Rehabilitation  SUBJECTIVE: "he has been a little sour" - wife reports that he is getting stubborn  about writing stuff down  Pt accompanied by: self and significant other  PAIN:  Are you having pain? No  PATIENT GOALS: to be able to communicate again  OBJECTIVE:   TODAY'S TREATMENT: Skilled treatment session focused on pt's communication goals. SLP facilitated session by providing the following interventions:  Pt brought in his calendar and was able to locate current week with activities written in calendar. Using sentence completion, he was able to intelligibly state the day and mod to min verbal cues, he was able to read the information he had written. SLP further facilitated session by providing basic 3 word sentences to assess/build pt's reading comprehension. Pt required moderate amount of repetition to condition to the task at hand. After this, pt demonstrated emerging ability to read perform the directions within the sentences. It was difficult to fulling know how much he understood.   Pt continues to use written information to help in communication breakdowns but has tendency to write down one word. During today's session, he had two incidences where he wrote down 2 words to communicate.     PATIENT EDUCATION: Education details: see above Person educated: Patient and Wife Education method: Explanation, Media planner, Verbal cues, and Handouts Education comprehension: verbalized understanding  HOME EXERCISE PROGRAM:  Complete calendar  GOALS: Goals reviewed with patient? Yes  SHORT TERM GOALS: Target date: 10 sessions  UPDATED - 06/24/2022  UPDATED - 07/19/2022  UPDATED - 08/20/2022 Given moderate assistance, pt will answer simple basic yes/no questions with 50% accuracy.  Baseline: 25% Goal status: MET UPDATED goal: Given minimal assistance, pt will answer simple basic yes/no questions with 75% accuracy.  Goal status: MET   2.  Given moderate assistance, pt will follow simple basic 1 step directions with 50% accuracy.  Baseline: 0% Goal status: MET  UPDATED goal:  Given minimal assistance, pt will following simple basic 1 step directions with 75% accuracy.   Goal status: MET   3.  With maximal multimodal cues, pt will name set of 10 common objects in 8 out of 10 opportunities.  Baseline: unable Goal status: MET UPDATED goal: With moderate cues, pt will name set of 10 common objects in 8 out of 10 opportunities.  Goal status: MET UPDATED goal: With minimal cues, pt will name set of 10 common objects in 8 out of 10 opportunities.  Goal status: ongoing   4. Pt will improve reading abilities by placing phrase level object function with object in field of 3 with 50% ability and moderate cues.              Baseline: object name to object - 90%             Goal status: MET  UPDATED goal: Pt will demonstrate reading comprehension at the sentence level for basic information with > 75% accuracy and min A cues.   Goal status: ONGOING  Goal status: ONGOING   5. Pt will improve spelling abilities by completing common words by filling in missing letter with 50% ability and moderate cues.              Baseline: unable             Goal status: MET UPDATED goal: Pt will improve spelling abilities by completing common words by filling in missing letters with >90% accuracy and rare min A cues.  Goal status: MET UPDATED goal: Pt will improve spelling ability by writing names of basic objects with supervision level cues in 8 out of 10 opportunities.  Goal status: ONGOING   6. Pt will repeat rote phrases with 25% speech intelligibility given max to moderate cues.              Baseline: unable             Goal status: MET  UPDATED goal: Pt will repeat rote phrases with ~ 75% speech intelligibility given minimal cues.   Goal status: ONGOING  Goal status: ONGOING    LONG TERM GOALS: Target date: 10/25/2022   Pt will use multimodal communication to express basic wants/needs in 4 of 10 opportunities.  Baseline: unable Goal status: ONGOING   2.  Pt will  demonstrate increased reading comprehension by selecting accurate answer from 3 choices in 2 out of 10 opportunities.  Baseline: unable Goal status: ONGOING   3.  Pt will correctly spell his name in 5 out of 10 opportunities.  Baseline: unable Goal status: ONGOING   ASSESSMENT:  CLINICAL IMPRESSION: Pt continues to make progress and demonstrate understanding of calendar use. While he writes down information to help during moments of communication breakdown, he writes down one word and is heavily reliant on his wife to guess > 6 times.   OBJECTIVE IMPAIRMENTS include expressive language, receptive language, aphasia, and apraxia. These impairments are limiting patient from managing medications, managing appointments, managing finances, household responsibilities, ADLs/IADLs, and effectively communicating at home and in community. Factors affecting potential to achieve goals and functional outcome are severity of impairments. Patient will benefit from skilled SLP services to address above impairments and improve overall function.  REHAB POTENTIAL: Excellent  PLAN: SLP FREQUENCY: 3x/week  SLP DURATION: 12 weeks  PLANNED INTERVENTIONS: Language facilitation, Environmental controls, Internal/external aids, Functional tasks, Multimodal communication approach, SLP instruction and feedback, Compensatory strategies, and Patient/family education   Terika Pillard B. Rutherford Nail, M.S., CCC-SLP, Mining engineer Certified Brain Injury [place]  [place] Office [phone] Ascom [phone] Fax [phone]

## 2022-08-30 ENCOUNTER — Ambulatory Visit: Payer: Medicare HMO | Admitting: Speech Pathology

## 2022-08-30 DIAGNOSIS — R4701 Aphasia: Secondary | ICD-10-CM | POA: Diagnosis not present

## 2022-08-30 DIAGNOSIS — R482 Apraxia: Secondary | ICD-10-CM | POA: Diagnosis not present

## 2022-08-30 DIAGNOSIS — I6602 Occlusion and stenosis of left middle cerebral artery: Secondary | ICD-10-CM

## 2022-08-30 NOTE — Therapy (Signed)
OUTPATIENT SPEECH LANGUAGE PATHOLOGY TREATMENT NOTE   Patient Name: Alan Stewart MRN: [id number] DOB:07/31/1946, 77 y.o., male Today's Date: 08/30/2022      PCP: Ramonita Lab, MD REFERRING PROVIDER: Ramonita Lab, MD   END OF SESSION   End of Session - 08/30/22 1114     Visit Number 34    Number of Visits 58    Date for SLP Re-Evaluation 10/25/22    Authorization Type [employer] HMO    Progress Note Due on Visit 51    SLP Start Time 1000    SLP Stop Time  1100    SLP Time Calculation (min) 60 min    Activity Tolerance Patient tolerated treatment well                    Past Medical History:  Diagnosis Date   CAD (coronary artery disease)    a. 10/2021 NSTEMI/PCI: LM nl, LAD min irregs, LCX 2m(2.5x18 Onyx Frontier DES), OM1 nl, RCA 929m3.5x26 Onyx Frontier DES), 30d.   Diastolic dysfunction    a. 10/2021 Echo: EF 50-55%, no rwma, Gr1 DD, nl RV fxn. No significant valvular dzs.   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 SCCis. MOHs 07/08/22   Past Surgical History:  Procedure Laterality Date   COLONOSCOPY N/A 10/09/2015   Procedure: COLONOSCOPY;  Surgeon: PaHulen LusterMD;  [facility];  Service: Gastroenterology;  Laterality: N/A;   CORONARY STENT INTERVENTION N/A 10/22/2021   Procedure: CORONARY STENT INTERVENTION;  Surgeon: ArWellington HampshireMD;  [facility];  Service: Cardiovascular;  Laterality: N/A;   IR CT HEAD LTD  05/21/2022   IR PERCUTANEOUS ART THROMBECTOMY/INFUSION INTRACRANIAL INC DIAG ANGIO  05/21/2022   LEFT HEART CATH AND CORONARY ANGIOGRAPHY N/A 10/22/2021   Procedure: LEFT HEART CATH AND CORONARY ANGIOGRAPHY;  Surgeon: ArWellington HampshireMD;  [place] [facility];  Service: Cardiovascular;   Laterality: N/A;   LOOP RECORDER INSERTION N/A 05/23/2022   Procedure: LOOP RECORDER INSERTION;  Surgeon: LaVickie EpleyMD;  [facility];  Service: Cardiovascular;  Laterality: N/A;   RADIOLOGY WITH ANESTHESIA N/A 05/20/2022   Procedure: IR WITH ANESTHESIA;  Surgeon: DeLuanne BrasMD;  [facility] Service: Radiology;  Laterality: N/A;   ROTATOR CUFF REPAIR     Patient Active Problem List   Diagnosis Date Noted   Stroke (cerebrum) ([facility]10/10/2021   Middle cerebral artery embolism, 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 male with PMH: HTN, CAD, HLD, osteoarthritis, SCC, sleep apnea. He presented to the hospital on 05/20/22 with right sided weakness and aphasia with LKW reported by wife to be 10pm on 10/2.  [facility] CT/CTA which demonstrated left M1 occlusion. He received IV  tenecteplase and IR thrombectomy due to large vessel occlusion.       DIAGNOSTIC FINDINGS:  MRI 05/22/2022 Cortical ischemia throughout the left MCA territory compatible with recent infarct. Relatively little affect on the white matter. 2. Small focus of acute hemorrhage along the anterior surface of the left temporal lobe Heidelberg classification 3c: Subarachnoid hemorrhage. 3. Normal intracranial MRA.  Restored flow in left MCA.    THERAPY DIAG:  Aphasia  Apraxia  Middle cerebral artery embolism, left  Rationale for Evaluation and Treatment Rehabilitation  SUBJECTIVE: pt arrived with his calendar completed and some mechanical pencils for SLP  to use with her patients  Pt accompanied by: self and significant other  PAIN:  Are you having pain? No  PATIENT GOALS: to be able to communicate again  OBJECTIVE:   TODAY'S TREATMENT: Skilled treatment session focused on pt's communication goals. SLP facilitated session by providing the following interventions:  TalkPath Therapy app was utilized to target reading comprehension, categorization and spelling.  Spelling - Level 1: 18/20 after writing down the word prior to spelling it on app; imitation of word 9/13 Reading - Sentence Completion - level 1 with SLP reading sentence 100% Category - level 1 - 18/20  Pt required maximal cues to listen and watch articulatory cues before attempting to produce words    PATIENT EDUCATION: Education details: see above Person educated: Patient and Wife Education method: Explanation, Demonstration, Verbal cues, and Handouts Education comprehension: verbalized understanding  HOME EXERCISE PROGRAM:  Complete calendar  GOALS: Goals reviewed with patient? Yes  SHORT TERM GOALS: Target date: 10 sessions  UPDATED - 06/24/2022  UPDATED - 07/19/2022  UPDATED - 08/20/2022 Given moderate assistance, pt will answer simple basic yes/no questions with 50% accuracy.  Baseline: 25% Goal status: MET UPDATED goal: Given minimal assistance, pt will answer simple basic yes/no questions with 75% accuracy.  Goal status: MET   2.  Given moderate assistance, pt will follow simple basic 1 step directions with 50% accuracy.  Baseline: 0% Goal status: MET  UPDATED goal: Given minimal assistance, pt will following simple basic 1 step directions with 75% accuracy.   Goal status: MET   3.  With maximal multimodal cues, pt will name set of 10 common objects in 8 out of 10 opportunities.  Baseline: unable Goal status: MET UPDATED goal: With moderate cues, pt will name set of 10 common objects in 8 out of 10 opportunities.  Goal status: MET UPDATED goal: With  minimal cues, pt will name set of 10 common objects in 8 out of 10 opportunities.  Goal status: ongoing   4. Pt will improve reading abilities by placing phrase level object function with object in field of 3 with 50% ability and moderate cues.              Baseline: object name to object - 90%             Goal status: MET  UPDATED goal: Pt will demonstrate reading comprehension at the sentence level for basic information with > 75% accuracy and min A cues.   Goal status: ONGOING  Goal status: ONGOING   5. Pt will improve spelling abilities by completing common words by filling in missing letter with 50% ability and moderate cues.              Baseline: unable             Goal status: MET UPDATED goal: Pt will improve spelling abilities by completing common  words by filling in missing letters with >90% accuracy and rare min A cues.  Goal status: MET UPDATED goal: Pt will improve spelling ability by writing names of basic objects with supervision level cues in 8 out of 10 opportunities.  Goal status: ONGOING   6. Pt will repeat rote phrases with 25% speech intelligibility given max to moderate cues.              Baseline: unable             Goal status: MET  UPDATED goal: Pt will repeat rote phrases with ~ 75% speech intelligibility given minimal cues.   Goal status: ONGOING  Goal status: ONGOING    LONG TERM GOALS: Target date: 10/25/2022   Pt will use multimodal communication to express basic wants/needs in 4 of 10 opportunities.  Baseline: unable Goal status: ONGOING   2.  Pt will demonstrate increased reading comprehension by selecting accurate answer from 3 choices in 2 out of 10 opportunities.  Baseline: unable Goal status: ONGOING   3.  Pt will correctly spell his name in 5 out of 10 opportunities.  Baseline: unable Goal status: ONGOING   ASSESSMENT:  CLINICAL IMPRESSION: Pt continues to make progress and demonstrate understanding of calendar use. While he writes down  information to help during moments of communication breakdown, he writes down one word and is heavily reliant on his wife to guess > 6 times.   OBJECTIVE IMPAIRMENTS include expressive language, receptive language, aphasia, and apraxia. These impairments are limiting patient from managing medications, managing appointments, managing finances, household responsibilities, ADLs/IADLs, and effectively communicating at home and in community. Factors affecting potential to achieve goals and functional outcome are severity of impairments. Patient will benefit from skilled SLP services to address above impairments and improve overall function.  REHAB POTENTIAL: Excellent  PLAN: SLP FREQUENCY: 3x/week  SLP DURATION: 12 weeks  PLANNED INTERVENTIONS: Language facilitation, Environmental controls, Internal/external aids, Functional tasks, Multimodal communication approach, SLP instruction and feedback, Compensatory strategies, and Patient/family education   Ondrea Dow B. Rutherford Nail, M.S., CCC-SLP, Mining engineer Certified Brain Injury [place]  [place] Office [phone] Ascom [phone] Fax [phone]

## 2022-09-02 ENCOUNTER — Encounter: Payer: Medicare HMO | Admitting: Occupational Therapy

## 2022-09-02 ENCOUNTER — Ambulatory Visit: Payer: Medicare HMO | Admitting: Speech Pathology

## 2022-09-02 ENCOUNTER — Ambulatory Visit: Payer: Medicare HMO

## 2022-09-02 ENCOUNTER — Other Ambulatory Visit: Payer: Self-pay

## 2022-09-02 DIAGNOSIS — I6602 Occlusion and stenosis of left middle cerebral artery: Secondary | ICD-10-CM | POA: Diagnosis not present

## 2022-09-02 DIAGNOSIS — R482 Apraxia: Secondary | ICD-10-CM | POA: Diagnosis not present

## 2022-09-02 DIAGNOSIS — R4701 Aphasia: Secondary | ICD-10-CM | POA: Diagnosis not present

## 2022-09-02 NOTE — Patient Outreach (Signed)
Telephone outreach to patient's wife to obtain mRS was successfully completed. MRS=3  [place] Care Management Assistant [phone]

## 2022-09-03 NOTE — Therapy (Signed)
OUTPATIENT SPEECH LANGUAGE PATHOLOGY TREATMENT NOTE   Patient Name: Alan Stewart MRN: [id number] DOB:Dec 14, 1945, 77 y.o., male Today's Date: 09/02/2022      PCP: Ramonita Lab, MD REFERRING PROVIDER: Ramonita Lab, MD   END OF SESSION   End of Session - 09/02/22 1914     Visit Number 35    Number of Visits 86    Date for SLP Re-Evaluation 10/25/22    Authorization Type [employer] HMO    Authorization Time Period 08/26/2022 thru 11/22/2022    Authorization - Visit Number 4    Authorization - Number of Visits 36    Progress Note Due on Visit 14    SLP Start Time 1100    SLP Stop Time  1200    SLP Time Calculation (min) 60 min    Activity Tolerance Patient tolerated treatment well                    Past Medical History:  Diagnosis Date   CAD (coronary artery disease)    a. 10/2021 NSTEMI/PCI: LM nl, LAD min irregs, LCX 17m(2.5x18 Onyx Frontier DES), OM1 nl, RCA 918m3.5x26 Onyx Frontier DES), 30d.   Diastolic dysfunction    a. 10/2021 Echo: EF 50-55%, no rwma, Gr1 DD, nl RV fxn. No significant valvular dzs.   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 SCCis. MOHs 07/08/22   Past Surgical History:  Procedure Laterality Date   COLONOSCOPY N/A 10/09/2015   Procedure: COLONOSCOPY;  Surgeon: PaHulen LusterMD;  [facility];  Service: Gastroenterology;  Laterality: N/A;   CORONARY STENT INTERVENTION N/A 10/22/2021   Procedure: CORONARY STENT INTERVENTION;  Surgeon: ArWellington HampshireMD;  [facility];  Service: Cardiovascular;  Laterality: N/A;   IR CT HEAD LTD  05/21/2022   IR PERCUTANEOUS ART THROMBECTOMY/INFUSION INTRACRANIAL INC DIAG ANGIO  05/21/2022   LEFT HEART CATH AND CORONARY ANGIOGRAPHY N/A 10/22/2021   Procedure: LEFT  HEART CATH AND CORONARY ANGIOGRAPHY;  Surgeon: ArWellington HampshireMD;  [facility];  Service: Cardiovascular;  Laterality: N/A;   LOOP RECORDER INSERTION N/A 05/23/2022   Procedure: LOOP RECORDER INSERTION;  Surgeon: LaVickie EpleyMD;  [facility];  Service: Cardiovascular;  Laterality: N/A;   RADIOLOGY WITH ANESTHESIA N/A 05/20/2022   Procedure: IR WITH ANESTHESIA;  Surgeon: DeLuanne BrasMD;  [place] Service: Radiology;  Laterality: N/A;   ROTATOR CUFF REPAIR     Patient Active Problem List   Diagnosis Date Noted   Stroke (cerebrum) (HCSal[place]10/10/2021   Middle cerebral artery embolism, 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 male with PMH: HTN, CAD, HLD, osteoarthritis, SCC, sleep apnea. He presented to the hospital on 05/20/22 with right sided weakness and aphasia with LKW reported by wife to  be 10pm on 10/2.  He arrived at [facility] as code stroke and taken for emergent CT/CTA which demonstrated left M1 occlusion. He received IV tenecteplase and IR thrombectomy due to large vessel occlusion.       DIAGNOSTIC FINDINGS:  MRI 05/22/2022 Cortical ischemia throughout the left MCA territory compatible with recent infarct. Relatively little affect on the white matter. 2. Small focus of acute hemorrhage along the anterior surface of the left temporal lobe Heidelberg classification 3c: Subarachnoid hemorrhage. 3. Normal intracranial MRA.  Restored flow in left MCA.    THERAPY DIAG:  Aphasia  Apraxia  Middle cerebral artery embolism, left  Rationale for  Evaluation and Treatment Rehabilitation  SUBJECTIVE: pt arrived with his calendar completed and some mechanical pencils for SLP to use with her patients  Pt accompanied by: self and significant other  PAIN:  Are you having pain? No  PATIENT GOALS: to be able to communicate again  OBJECTIVE:   TODAY'S TREATMENT: Skilled treatment session focused on pt's communication goals. SLP facilitated session by providing the following interventions:  TalkPath Therapy app was utilized to target reading comprehension, categorization and spelling.  Spelling - Level 2: 13/20 without writing down the words prior to using app; improving to 18 out of 20 when writing words down first; imitation of word 9/13 Reading - Sentence Completion - level 2 - pt struggled with completion d/t abstract nature of words and sentences  PATIENT EDUCATION: Education details: see above Person educated: Patient and Wife Education method: Consulting civil engineer, Demonstration, Verbal cues, and Handouts Education comprehension: verbalized understanding  HOME EXERCISE PROGRAM:  Complete calendar  GOALS: Goals reviewed with patient? Yes  SHORT TERM GOALS: Target date: 10 sessions  UPDATED - 06/24/2022  UPDATED - 07/19/2022  UPDATED - 08/20/2022 Given moderate assistance, pt will answer simple basic yes/no questions with 50% accuracy.  Baseline: 25% Goal status: MET UPDATED goal: Given minimal assistance, pt will answer simple basic yes/no questions with 75% accuracy.  Goal status: MET   2.  Given moderate assistance, pt will follow simple basic 1 step directions with 50% accuracy.  Baseline: 0% Goal status: MET  UPDATED goal: Given minimal assistance, pt will following simple basic 1 step directions with 75% accuracy.   Goal status: MET   3.  With maximal multimodal cues, pt will name set of 10 common objects in 8 out of 10 opportunities.  Baseline: unable Goal status: MET UPDATED goal: With moderate cues, pt will name  set of 10 common objects in 8 out of 10 opportunities.  Goal status: MET UPDATED goal: With minimal cues, pt will name set of 10 common objects in 8 out of 10 opportunities.  Goal status: ongoing   4. Pt will improve reading abilities by placing phrase level object function with object in field of 3 with 50% ability and moderate cues.              Baseline: object name to object - 90%             Goal status: MET  UPDATED goal: Pt will demonstrate reading comprehension at the sentence level for basic information with > 75% accuracy and min A cues.   Goal status: ONGOING  Goal status: ONGOING   5. Pt will improve spelling abilities by completing common words by filling in missing letter with 50% ability and moderate cues.              Baseline: unable  Goal status: MET UPDATED goal: Pt will improve spelling abilities by completing common words by filling in missing letters with >90% accuracy and rare min A cues.  Goal status: MET UPDATED goal: Pt will improve spelling ability by writing names of basic objects with supervision level cues in 8 out of 10 opportunities.  Goal status: ONGOING   6. Pt will repeat rote phrases with 25% speech intelligibility given max to moderate cues.              Baseline: unable             Goal status: MET  UPDATED goal: Pt will repeat rote phrases with ~ 75% speech intelligibility given minimal cues.   Goal status: ONGOING  Goal status: ONGOING    LONG TERM GOALS: Target date: 10/25/2022   Pt will use multimodal communication to express basic wants/needs in 4 of 10 opportunities.  Baseline: unable Goal status: ONGOING   2.  Pt will demonstrate increased reading comprehension by selecting accurate answer from 3 choices in 2 out of 10 opportunities.  Baseline: unable Goal status: ONGOING   3.  Pt will correctly spell his name in 5 out of 10 opportunities.  Baseline: unable Goal status: ONGOING   ASSESSMENT:  CLINICAL  IMPRESSION: While pt continues to make progress, he struggled during today's session with abstract nature of words/sentences.   OBJECTIVE IMPAIRMENTS include expressive language, receptive language, aphasia, and apraxia. These impairments are limiting patient from managing medications, managing appointments, managing finances, household responsibilities, ADLs/IADLs, and effectively communicating at home and in community. Factors affecting potential to achieve goals and functional outcome are severity of impairments. Patient will benefit from skilled SLP services to address above impairments and improve overall function.  REHAB POTENTIAL: Excellent  PLAN: SLP FREQUENCY: 3x/week  SLP DURATION: 12 weeks  PLANNED INTERVENTIONS: Language facilitation, Environmental controls, Internal/external aids, Functional tasks, Multimodal communication approach, SLP instruction and feedback, Compensatory strategies, and Patient/family education   Sanuel Ladnier B. Rutherford Nail, M.S., CCC-SLP, Mining engineer Certified Brain Injury [place]  [place] Office [phone] Ascom [phone] Fax [phone]

## 2022-09-04 ENCOUNTER — Encounter: Payer: Medicare HMO | Admitting: Occupational Therapy

## 2022-09-04 ENCOUNTER — Ambulatory Visit: Payer: Medicare HMO | Admitting: Speech Pathology

## 2022-09-04 ENCOUNTER — Ambulatory Visit: Payer: Medicare HMO

## 2022-09-04 DIAGNOSIS — R482 Apraxia: Secondary | ICD-10-CM

## 2022-09-04 DIAGNOSIS — R4701 Aphasia: Secondary | ICD-10-CM | POA: Diagnosis not present

## 2022-09-04 DIAGNOSIS — I6602 Occlusion and stenosis of left middle cerebral artery: Secondary | ICD-10-CM

## 2022-09-04 NOTE — Therapy (Signed)
OUTPATIENT SPEECH LANGUAGE PATHOLOGY TREATMENT NOTE   Patient Name: Alan Stewart MRN: [id number] DOB:Feb 14, 1946, 77 y.o., male Today's Date: 09/04/2022  PCP: Ramonita Lab, MD REFERRING PROVIDER: Ramonita Lab, MD   END OF SESSION   End of Session - 09/04/22 1635     Visit Number 36    Number of Visits 14    Date for SLP Re-Evaluation 10/25/22    Authorization Type [employer] H[employer]    Authorization Time Period 08/26/2022 thru 11/22/2022    Authorization - Visit Number 5    Authorization - Number of Visits 36    Progress Note Due on Visit 3    SLP Start Time 1100    SLP Stop Time  1200    SLP Time Calculation (min) 60 min    Activity Tolerance Patient tolerated treatment well               Past Medical History:  Diagnosis Date   CAD (coronary artery disease)    a. 10/2021 NSTEMI/PCI: LM nl, LAD min irregs, LCX 70m(2.5x18 Onyx Frontier DES), OM1 nl, RCA 928m3.5x26 Onyx Frontier DES), 30d.   Diastolic dysfunction    a. 10/2021 Echo: EF 50-55%, no rwma, Gr1 DD, nl RV fxn. No significant valvular dzs.   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 SCCis. MOHs 07/08/22   Past Surgical History:  Procedure Laterality Date   COLONOSCOPY N/A 10/09/2015   Procedure: COLONOSCOPY;  Surgeon: PaHulen LusterMD;  [facility];  Service: Gastroenterology;  Laterality: N/A;   CORONARY STENT INTERVENTION N/A 10/22/2021   Procedure: CORONARY STENT INTERVENTION;  Surgeon: ArWellington HampshireMD;  [facility];  Service: Cardiovascular;  Laterality: N/A;   IR CT HEAD LTD  05/21/2022   IR PERCUTANEOUS ART THROMBECTOMY/INFUSION INTRACRANIAL INC DIAG ANGIO  05/21/2022   LEFT HEART CATH AND CORONARY ANGIOGRAPHY N/A 10/22/2021   Procedure: LEFT HEART CATH AND  CORONARY ANGIOGRAPHY;  Surgeon: ArWellington HampshireMD;  [facility];  Service: Cardiovascular;  Laterality: N/A;   LOOP RECORDER INSERTION N/A 05/23/2022   Procedure: LOOP RECORDER INSERTION;  Surgeon: LaVickie EpleyMD;  [facility];  Service: Cardiovascular;  Laterality: N/A;   RADIOLOGY WITH ANESTHESIA N/A 05/20/2022   Procedure: IR WITH ANESTHESIA;  Surgeon: DeLuanne BrasMD;  [place] [facility] Service: Radiology;  Laterality: N/A;   ROTATOR CUFF REPAIR     Patient Active Problem List   Diagnosis Date Noted   Stroke (cerebrum) ([facility]10/10/2021   Middle cerebral artery embolism, 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 male with PMH: HTN, CAD, HLD, osteoarthritis, SCC, sleep apnea. He presented to the hospital on 05/20/22 with right sided weakness and aphasia with LKW reported by wife to be 10pm on 10/2.  [facility] and [facility] for emergent CT/CTA which demonstrated left M1 occlusion. He received IV tenecteplase and IR thrombectomy due to large vessel occlusion.       DIAGNOSTIC FINDINGS:  MRI 05/22/2022 Cortical ischemia throughout the left MCA territory compatible with recent infarct. Relatively little affect on the white matter. 2. Small focus of acute hemorrhage along the anterior surface of the left temporal lobe Heidelberg classification 3c: Subarachnoid hemorrhage. 3. Normal intracranial MRA.  Restored flow in left MCA.    THERAPY DIAG:  Aphasia  Apraxia  Middle cerebral artery embolism, left  Rationale for Evaluation and  Treatment Rehabilitation  SUBJECTIVE: pt forgot to bring his calendar, his nonverbals expressed that he hated he forgot it  Pt accompanied by: self and significant other  PAIN:  Are you having pain? No  PATIENT GOALS: to be able to communicate again  OBJECTIVE:   TODAY'S TREATMENT: Skilled treatment session focused on pt's communication goals. SLP facilitated session by providing the following interventions:  SLP engaged pt in VNest targeting simple subject and verbs. Pt benefits visual articulatory support for speech approximation. Able to expand to subject/verb/object with pt adding articles in his functional speech appropriately.   PATIENT EDUCATION: Education details: see above Person educated: Patient and Wife Education method: Explanation, Media planner, Verbal cues, and Handouts Education comprehension: verbalized understanding  HOME EXERCISE PROGRAM:  Complete calendar  GOALS: Goals reviewed with patient? Yes  SHORT TERM GOALS: Target date: 10 sessions  UPDATED - 06/24/2022  UPDATED - 07/19/2022  UPDATED - 08/20/2022 Given moderate assistance, pt will answer simple basic yes/no questions with 50% accuracy.  Baseline: 25% Goal status: MET UPDATED goal: Given minimal assistance, pt will answer simple basic yes/no questions with 75% accuracy.  Goal status: MET   2.  Given moderate assistance, pt will follow simple basic 1 step directions with 50% accuracy.  Baseline: 0% Goal status: MET  UPDATED goal: Given minimal assistance, pt will following simple basic 1 step directions with 75% accuracy.   Goal status: MET   3.  With maximal multimodal cues, pt will name set of 10 common objects in 8 out of 10 opportunities.  Baseline: unable Goal status: MET UPDATED goal: With moderate cues, pt will name set of 10 common objects in 8 out of 10 opportunities.  Goal status: MET UPDATED goal: With minimal cues, pt will name set of 10 common objects in 8 out of 10  opportunities.  Goal status: ongoing   4. Pt will improve reading abilities by placing phrase level object function with object in field of 3 with 50% ability and moderate cues.              Baseline: object name to object - 90%             Goal status: MET  UPDATED goal: Pt will demonstrate reading comprehension at the sentence level for basic information with > 75% accuracy and min A cues.   Goal status: ONGOING  Goal status: ONGOING   5. Pt will improve spelling abilities by completing common words by filling in missing letter with 50% ability and moderate cues.              Baseline: unable             Goal status: MET UPDATED goal: Pt will improve spelling abilities by completing common words by filling in missing letters with >90% accuracy and rare min A cues.  Goal status: MET UPDATED goal: Pt  will improve spelling ability by writing names of basic objects with supervision level cues in 8 out of 10 opportunities.  Goal status: ONGOING   6. Pt will repeat rote phrases with 25% speech intelligibility given max to moderate cues.              Baseline: unable             Goal status: MET  UPDATED goal: Pt will repeat rote phrases with ~ 75% speech intelligibility given minimal cues.   Goal status: ONGOING  Goal status: ONGOING    LONG TERM GOALS: Target date: 10/25/2022   Pt will use multimodal communication to express basic wants/needs in 4 of 10 opportunities.  Baseline: unable Goal status: ONGOING   2.  Pt will demonstrate increased reading comprehension by selecting accurate answer from 3 choices in 2 out of 10 opportunities.  Baseline: unable Goal status: ONGOING   3.  Pt will correctly spell his name in 5 out of 10 opportunities.  Baseline: unable Goal status: ONGOING   ASSESSMENT:  CLINICAL IMPRESSION: Pt demonstrated improved ability to participate in VNeSt.   OBJECTIVE IMPAIRMENTS include expressive language, receptive language, aphasia, and apraxia. These  impairments are limiting patient from managing medications, managing appointments, managing finances, household responsibilities, ADLs/IADLs, and effectively communicating at home and in community. Factors affecting potential to achieve goals and functional outcome are severity of impairments. Patient will benefit from skilled SLP services to address above impairments and improve overall function.  REHAB POTENTIAL: Excellent  PLAN: SLP FREQUENCY: 3x/week  SLP DURATION: 12 weeks  PLANNED INTERVENTIONS: Language facilitation, Environmental controls, Internal/external aids, Functional tasks, Multimodal communication approach, SLP instruction and feedback, Compensatory strategies, and Patient/family education   Lynden Flemmer B. Rutherford Nail, M.S., CCC-SLP, Mining engineer Certified Brain Injury [place]  [place] Office [phone] Ascom [phone] Fax [phone]

## 2022-09-06 ENCOUNTER — Ambulatory Visit: Payer: Medicare HMO | Admitting: Speech Pathology

## 2022-09-06 DIAGNOSIS — I6602 Occlusion and stenosis of left middle cerebral artery: Secondary | ICD-10-CM | POA: Diagnosis not present

## 2022-09-06 DIAGNOSIS — R482 Apraxia: Secondary | ICD-10-CM | POA: Diagnosis not present

## 2022-09-06 DIAGNOSIS — R4701 Aphasia: Secondary | ICD-10-CM

## 2022-09-06 NOTE — Therapy (Signed)
OUTPATIENT SPEECH LANGUAGE PATHOLOGY TREATMENT NOTE   Patient Name: Alan Stewart MRN: [id number] DOB:02-28-1946, 77 y.o., male Today's Date: 09/06/2022  PCP: Ramonita Lab, MD REFERRING PROVIDER: Ramonita Lab, MD   END OF SESSION   End of Session - 09/06/22 1413     Visit Number 37    Number of Visits 81    Date for SLP Re-Evaluation 10/25/22    Authorization Type [employer] HMO    Authorization Time Period 08/26/2022 thru 11/22/2022    Authorization - Visit Number 6    Authorization - Number of Visits 36    Progress Note Due on Visit 38    SLP Start Time 1000    SLP Stop Time  1100    SLP Time Calculation (min) 60 min    Activity Tolerance Patient tolerated treatment well               Past Medical History:  Diagnosis Date   CAD (coronary artery disease)    a. 10/2021 NSTEMI/PCI: LM nl, LAD min irregs, LCX 65m(2.5x18 Onyx Frontier DES), OM1 nl, RCA 996m3.5x26 Onyx Frontier DES), 30d.   Diastolic dysfunction    a. 10/2021 Echo: EF 50-55%, no rwma, Gr1 DD, nl RV fxn. No significant valvular dzs.   Hyperglycemia    Hyperlipidemia    Hypertension    Morbid obesity (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 SCCis. MOHs 07/08/22   Past Surgical History:  Procedure Laterality Date   COLONOSCOPY N/A 10/09/2015   Procedure: COLONOSCOPY;  Surgeon: PaHulen LusterMD;  [facility];  Service: Gastroenterology;  Laterality: N/A;   CORONARY STENT INTERVENTION N/A 10/22/2021   Procedure: CORONARY STENT INTERVENTION;  Surgeon: ArWellington HampshireMD;  [place] [facility];  Service: Cardiovascular;  Laterality: N/A;   IR CT HEAD LTD  05/21/2022   IR PERCUTANEOUS ART THROMBECTOMY/INFUSION INTRACRANIAL INC DIAG ANGIO  05/21/2022   LEFT HEART CATH AND CORONARY ANGIOGRAPHY N/A 10/22/2021   Procedure: LEFT HEART CATH AND  CORONARY ANGIOGRAPHY;  Surgeon: ArWellington HampshireMD;  [place] [facility];  Service: Cardiovascular;  Laterality: N/A;   LOOP RECORDER INSERTION N/A 05/23/2022   Procedure: LOOP RECORDER INSERTION;  Surgeon: LaVickie EpleyMD;  [place] [facility];  Service: Cardiovascular;  Laterality: N/A;   RADIOLOGY WITH ANESTHESIA N/A 05/20/2022   Procedure: IR WITH ANESTHESIA;  Surgeon: DeLuanne BrasMD;  [place] [facility] Service: Radiology;  Laterality: N/A;   ROTATOR CUFF REPAIR     Patient Active Problem List   Diagnosis Date Noted   Stroke (cerebrum) (HC[facility] [place]10/10/2021   Middle cerebral artery embolism, 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 male with PMH: HTN, CAD, HLD, osteoarthritis, SCC, sleep apnea. He presented to the hospital on 05/20/22 with right sided weakness and aphasia with LKW reported by wife to be 10pm on 10/2.  [facility] for emergent CT/CTA which demonstrated left M1 occlusion. He received IV tenecteplase and IR thrombectomy due to large vessel occlusion.       DIAGNOSTIC FINDINGS:  MRI 05/22/2022 Cortical ischemia throughout the left MCA territory compatible with recent infarct. Relatively little affect on the white matter. 2. Small focus of acute hemorrhage along the anterior surface of the left temporal lobe Heidelberg classification 3c: Subarachnoid hemorrhage. 3. Normal intracranial MRA.  Restored flow in left MCA.    THERAPY DIAG:  Aphasia  Apraxia  Middle cerebral artery embolism, left  Rationale for Evaluation and  Treatment Rehabilitation  SUBJECTIVE: pt forgot to bring his calendar, his nonverbals expressed that he hated he forgot it  Pt accompanied by: self and significant other  PAIN:  Are you having pain? No  PATIENT GOALS: to be able to communicate again  OBJECTIVE:   TODAY'S TREATMENT: Skilled treatment session focused on pt's communication goals. SLP facilitated session by providing the following interventions:  SLP provided instruction and modeling of language facilitation during communication break downs.   PATIENT EDUCATION: Education details: see above Person educated: Patient and Wife Education method: Explanation, Media planner, Verbal cues, and Handouts Education comprehension: verbalized understanding  HOME EXERCISE PROGRAM:  Complete calendar  GOALS: Goals reviewed with patient? Yes  SHORT TERM GOALS: Target date: 10 sessions  UPDATED - 06/24/2022  UPDATED - 07/19/2022  UPDATED - 08/20/2022 Given moderate assistance, pt will answer simple basic yes/no questions with 50% accuracy.  Baseline: 25% Goal status: MET UPDATED goal: Given minimal assistance, pt will answer simple basic yes/no questions with 75% accuracy.  Goal status: MET   2.  Given moderate assistance, pt will follow simple basic 1 step directions with 50% accuracy.  Baseline: 0% Goal status: MET  UPDATED goal: Given minimal assistance, pt will following simple basic 1 step directions with 75% accuracy.   Goal status: MET   3.  With maximal multimodal cues, pt will name set of 10 common objects in 8 out of 10 opportunities.  Baseline: unable Goal status: MET UPDATED goal: With moderate cues, pt will name set of 10 common objects in 8 out of 10 opportunities.  Goal status: MET UPDATED goal: With minimal cues, pt will name set of 10 common objects in 8 out of 10 opportunities.  Goal status: ongoing   4. Pt will improve reading abilities by placing phrase level object function with object in field  of 3 with 50% ability and moderate cues.              Baseline: object name to object - 90%             Goal status: MET  UPDATED goal: Pt will demonstrate reading comprehension at the sentence level for basic information with > 75% accuracy and min A cues.   Goal status: ONGOING  Goal status: ONGOING   5. Pt will improve spelling abilities by completing common words by filling in missing letter with 50% ability and moderate cues.              Baseline: unable             Goal status: MET UPDATED goal: Pt will improve spelling abilities by completing common words by filling in missing letters with >90% accuracy and rare min A cues.  Goal status: MET UPDATED goal: Pt will improve spelling ability by writing names of basic objects with supervision level cues in 8 out of 10 opportunities.  Goal status: ONGOING   6. Pt will repeat rote phrases with 25% speech intelligibility given max to moderate cues.              Baseline: unable             Goal status: MET  UPDATED goal: Pt will repeat rote phrases with ~ 75% speech intelligibility given minimal cues.   Goal status: ONGOING  Goal status: ONGOING    LONG TERM GOALS: Target date: 10/25/2022   Pt will use multimodal communication to express basic wants/needs in 4 of 10 opportunities.  Baseline: unable Goal status: ONGOING   2.  Pt will demonstrate increased reading comprehension by selecting accurate answer from 3 choices in 2 out of 10 opportunities.  Baseline: unable Goal status: ONGOING   3.  Pt will correctly spell his name in 5 out of 10 opportunities.  Baseline: unable Goal status: ONGOING   ASSESSMENT:  CLINICAL IMPRESSION: Pt demonstrated improved ability to participate in VNeSt.   OBJECTIVE IMPAIRMENTS include expressive language, receptive language, aphasia, and apraxia. These impairments are limiting patient from managing medications, managing appointments, managing finances, household responsibilities, ADLs/IADLs,  and effectively communicating at home and in community. Factors affecting potential to achieve goals and functional outcome are severity of impairments. Patient will benefit from skilled SLP services to address above impairments and improve overall function.  REHAB POTENTIAL: Excellent  PLAN: SLP FREQUENCY: 3x/week  SLP DURATION: 12 weeks  PLANNED INTERVENTIONS: Language facilitation, Environmental controls, Internal/external aids, Functional tasks, Multimodal communication approach, SLP instruction and feedback, Compensatory strategies, and Patient/family education   Khyron Garno B. Rutherford Nail, M.S., CCC-SLP, Mining engineer Certified Brain Injury [place]  [place] Office [phone] Ascom [phone] Fax [phone]

## 2022-09-09 ENCOUNTER — Ambulatory Visit: Payer: Medicare HMO | Admitting: Speech Pathology

## 2022-09-09 DIAGNOSIS — R482 Apraxia: Secondary | ICD-10-CM | POA: Diagnosis not present

## 2022-09-09 DIAGNOSIS — R4701 Aphasia: Secondary | ICD-10-CM | POA: Diagnosis not present

## 2022-09-09 DIAGNOSIS — I6602 Occlusion and stenosis of left middle cerebral artery: Secondary | ICD-10-CM

## 2022-09-09 NOTE — Therapy (Signed)
OUTPATIENT SPEECH LANGUAGE PATHOLOGY TREATMENT NOTE   Patient Name: Alan Stewart MRN: [id number] DOB:1945-12-23, 77 y.o., male Today's Date: 09/09/2022  PCP: Ramonita Lab, MD REFERRING PROVIDER: Ramonita Lab, MD   END OF SESSION   End of Session - 09/09/22 1303     Visit Number 38    Number of Visits 64    Date for SLP Re-Evaluation 10/25/22    Authorization Type [employer] H[employer]    Authorization Time Period 08/26/2022 thru 11/22/2022    Authorization - Visit Number 7    Authorization - Number of Visits 36    Progress Note Due on Visit 46    SLP Start Time 1300    SLP Stop Time  1400    SLP Time Calculation (min) 60 min    Activity Tolerance Patient tolerated treatment well               Past Medical History:  Diagnosis Date   CAD (coronary artery disease)    a. 10/2021 NSTEMI/PCI: LM nl, LAD min irregs, LCX 68m(2.5x18 Onyx Frontier DES), OM1 nl, RCA 974m3.5x26 Onyx Frontier DES), 30d.   Diastolic dysfunction    a. 10/2021 Echo: EF 50-55%, no rwma, Gr1 DD, nl RV fxn. No significant valvular dzs.   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 SCCis. MOHs 07/08/22   Past Surgical History:  Procedure Laterality Date   COLONOSCOPY N/A 10/09/2015   Procedure: COLONOSCOPY;  Surgeon: PaHulen LusterMD;  [facility];  Service: Gastroenterology;  Laterality: N/A;   CORONARY STENT INTERVENTION N/A 10/22/2021   Procedure: CORONARY STENT INTERVENTION;  Surgeon: ArWellington HampshireMD;  [facility];  Service: Cardiovascular;  Laterality: N/A;   IR CT HEAD LTD  05/21/2022   IR PERCUTANEOUS ART THROMBECTOMY/INFUSION INTRACRANIAL INC DIAG ANGIO  05/21/2022   LEFT HEART CATH AND CORONARY ANGIOGRAPHY N/A 10/22/2021   Procedure: LEFT HEART CATH AND  CORONARY ANGIOGRAPHY;  Surgeon: ArWellington HampshireMD;  [facility];  Service: Cardiovascular;  Laterality: N/A;   LOOP RECORDER INSERTION N/A 05/23/2022   Procedure: LOOP RECORDER INSERTION;  Surgeon: LaVickie EpleyMD;  [place] [facility];  Service: Cardiovascular;  Laterality: N/A;   RADIOLOGY WITH ANESTHESIA N/A 05/20/2022   Procedure: IR WITH ANESTHESIA;  Surgeon: DeLuanne BrasMD;  [facility] Service: Radiology;  Laterality: N/A;   ROTATOR CUFF REPAIR     Patient Active Problem List   Diagnosis Date Noted   Stroke (cerebrum) (HCHorses[place]10/10/2021   Middle cerebral artery embolism, 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 male with PMH: HTN, CAD, HLD, osteoarthritis, SCC, sleep apnea. He presented to the hospital on 05/20/22 with right sided weakness and aphasia with LKW reported by wife to be 10pm on 10/2.  [facility] and taken for emergent CT/CTA which demonstrated left M1 occlusion. He received IV tenecteplase and IR thrombectomy due to large vessel occlusion.       DIAGNOSTIC FINDINGS:  MRI 05/22/2022 Cortical ischemia throughout the left MCA territory compatible with recent infarct. Relatively little affect on the white matter. 2. Small focus of acute hemorrhage along the anterior surface of the left temporal lobe Heidelberg classification 3c: Subarachnoid hemorrhage. 3. Normal intracranial MRA.  Restored flow in left MCA.    THERAPY DIAG:  Aphasia  Apraxia  Middle cerebral artery embolism, left  Rationale for Evaluation and  Treatment Rehabilitation  SUBJECTIVE: pt forgot to bring his calendar, his nonverbals expressed that he hated he forgot it  Pt accompanied by: self and significant other  PAIN:  Are you having pain? No  PATIENT GOALS: to be able to communicate again  OBJECTIVE:   TODAY'S TREATMENT: Skilled treatment session focused on pt's communication goals. SLP facilitated session by providing the following interventions:  SLP provided instruction and modeling of language facilitation during communication break downs.   PATIENT EDUCATION: Education details: see above Person educated: Patient and Wife Education method: Explanation, Media planner, Verbal cues, and Handouts Education comprehension: verbalized understanding  HOME EXERCISE PROGRAM:  Complete calendar  GOALS: Goals reviewed with patient? Yes  SHORT TERM GOALS: Target date: 10 sessions  UPDATED - 06/24/2022  UPDATED - 07/19/2022  UPDATED - 08/20/2022 Given moderate assistance, pt will answer simple basic yes/no questions with 50% accuracy.  Baseline: 25% Goal status: MET UPDATED goal: Given minimal assistance, pt will answer simple basic yes/no questions with 75% accuracy.  Goal status: MET   2.  Given moderate assistance, pt will follow simple basic 1 step directions with 50% accuracy.  Baseline: 0% Goal status: MET  UPDATED goal: Given minimal assistance, pt will following simple basic 1 step directions with 75% accuracy.   Goal status: MET   3.  With maximal multimodal cues, pt will name set of 10 common objects in 8 out of 10 opportunities.  Baseline: unable Goal status: MET UPDATED goal: With moderate cues, pt will name set of 10 common objects in 8 out of 10 opportunities.  Goal status: MET UPDATED goal: With minimal cues, pt will name set of 10 common objects in 8 out of 10 opportunities.  Goal status: ongoing   4. Pt will improve reading abilities by placing phrase level object function with object in field  of 3 with 50% ability and moderate cues.              Baseline: object name to object - 90%             Goal status: MET  UPDATED goal: Pt will demonstrate reading comprehension at the sentence level for basic information with > 75% accuracy and min A cues.   Goal status: ONGOING  Goal status: ONGOING   5. Pt will improve spelling abilities by completing common words by filling in missing letter with 50% ability and moderate cues.              Baseline: unable             Goal status: MET UPDATED goal: Pt will improve spelling abilities by completing common words by filling in missing letters with >90% accuracy and rare min A cues.  Goal status: MET UPDATED goal: Pt will improve spelling ability by writing names of basic objects with supervision level cues in 8 out of 10 opportunities.  Goal status: ONGOING   6. Pt will repeat rote phrases with 25% speech intelligibility given max to moderate cues.              Baseline: unable             Goal status: MET  UPDATED goal: Pt will repeat rote phrases with ~ 75% speech intelligibility given minimal cues.   Goal status: ONGOING  Goal status: ONGOING    LONG TERM GOALS: Target date: 10/25/2022   Pt will use multimodal communication to express basic wants/needs in 4 of 10 opportunities.  Baseline: unable Goal status: ONGOING   2.  Pt will demonstrate increased reading comprehension by selecting accurate answer from 3 choices in 2 out of 10 opportunities.  Baseline: unable Goal status: ONGOING   3.  Pt will correctly spell his name in 5 out of 10 opportunities.  Baseline: unable Goal status: ONGOING   ASSESSMENT:  CLINICAL IMPRESSION: Pt demonstrated improved ability to participate in VNeSt.   OBJECTIVE IMPAIRMENTS include expressive language, receptive language, aphasia, and apraxia. These impairments are limiting patient from managing medications, managing appointments, managing finances, household responsibilities, ADLs/IADLs,  and effectively communicating at home and in community. Factors affecting potential to achieve goals and functional outcome are severity of impairments. Patient will benefit from skilled SLP services to address above impairments and improve overall function.  REHAB POTENTIAL: Excellent  PLAN: SLP FREQUENCY: 3x/week  SLP DURATION: 12 weeks  PLANNED INTERVENTIONS: Language facilitation, Environmental controls, Internal/external aids, Functional tasks, Multimodal communication approach, SLP instruction and feedback, Compensatory strategies, and Patient/family education   Odyssey Vasbinder B. Rutherford Nail, M.S., CCC-SLP, Mining engineer Certified Brain Injury [place]  [place] Office [phone] Ascom [phone] Fax [phone]

## 2022-09-10 NOTE — Progress Notes (Signed)
[facility] Summary Report / Loop Recorder

## 2022-09-11 ENCOUNTER — Ambulatory Visit: Payer: Medicare HMO | Admitting: Speech Pathology

## 2022-09-11 DIAGNOSIS — R482 Apraxia: Secondary | ICD-10-CM

## 2022-09-11 DIAGNOSIS — I6602 Occlusion and stenosis of left middle cerebral artery: Secondary | ICD-10-CM

## 2022-09-11 DIAGNOSIS — R4701 Aphasia: Secondary | ICD-10-CM | POA: Diagnosis not present

## 2022-09-11 NOTE — Therapy (Unsigned)
OUTPATIENT SPEECH LANGUAGE PATHOLOGY TREATMENT NOTE   Patient Name: Alan Stewart MRN: [id number] DOB:12-25-1945, 77 y.o., male Today's Date: 09/11/2022  PCP: Ramonita Lab, MD REFERRING PROVIDER: Ramonita Lab, MD   END OF SESSION   End of Session - 09/11/22 1846     Visit Number 72    Number of Visits 77    Date for SLP Re-Evaluation 10/25/22    Authorization Type [employer] H[employer]    Authorization Time Period 08/26/2022 thru 11/22/2022    Authorization - Visit Number 8    Authorization - Number of Visits 36    Progress Note Due on Visit 72    SLP Start Time 1000    SLP Stop Time  1100    SLP Time Calculation (min) 60 min    Activity Tolerance Patient tolerated treatment well               Past Medical History:  Diagnosis Date   CAD (coronary artery disease)    a. 10/2021 NSTEMI/PCI: LM nl, LAD min irregs, LCX 36m(2.5x18 Onyx Frontier DES), OM1 nl, RCA 942m3.5x26 Onyx Frontier DES), 30d.   Diastolic dysfunction    a. 10/2021 Echo: EF 50-55%, no rwma, Gr1 DD, nl RV fxn. No significant valvular dzs.   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 SCCis. MOHs 07/08/22   Past Surgical History:  Procedure Laterality Date   COLONOSCOPY N/A 10/09/2015   Procedure: COLONOSCOPY;  Surgeon: PaHulen LusterMD;  [facility];  Service: Gastroenterology;  Laterality: N/A;   CORONARY STENT INTERVENTION N/A 10/22/2021   Procedure: CORONARY STENT INTERVENTION;  Surgeon: ArWellington HampshireMD;  [facility];  Service: Cardiovascular;  Laterality: N/A;   IR CT HEAD LTD  05/21/2022   IR PERCUTANEOUS ART THROMBECTOMY/INFUSION INTRACRANIAL INC DIAG ANGIO  05/21/2022   LEFT HEART CATH AND CORONARY ANGIOGRAPHY N/A 10/22/2021   Procedure: LEFT HEART CATH AND  CORONARY ANGIOGRAPHY;  Surgeon: ArWellington HampshireMD;  [place] [facility];  Service: Cardiovascular;  Laterality: N/A;   LOOP RECORDER INSERTION N/A 05/23/2022   Procedure: LOOP RECORDER INSERTION;  Surgeon: LaVickie EpleyMD;  [facility];  Service: Cardiovascular;  Laterality: N/A;   RADIOLOGY WITH ANESTHESIA N/A 05/20/2022   Procedure: IR WITH ANESTHESIA;  Surgeon: DeLuanne BrasMD;  [place] [facility] Service: Radiology;  Laterality: N/A;   ROTATOR CUFF REPAIR     Patient Active Problem List   Diagnosis Date Noted   Stroke (cerebrum) (HCHag[place]10/10/2021   Middle cerebral artery embolism, 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 male with PMH: HTN, CAD, HLD, osteoarthritis, SCC, sleep apnea. He presented to the hospital on 05/20/22 with right sided weakness and aphasia with LKW reported by wife to be 10pm on 10/2.  [facility] for emergent CT/CTA which demonstrated left M1 occlusion. He received IV tenecteplase and IR thrombectomy due to large vessel occlusion.       DIAGNOSTIC FINDINGS:  MRI 05/22/2022 Cortical ischemia throughout the left MCA territory compatible with recent infarct. Relatively little affect on the white matter. 2. Small focus of acute hemorrhage along the anterior surface of the left temporal lobe Heidelberg classification 3c: Subarachnoid hemorrhage. 3. Normal intracranial MRA.  Restored flow in left MCA.    THERAPY DIAG:  Aphasia  Apraxia  Middle cerebral artery embolism, left  Rationale for Evaluation and  Treatment Rehabilitation  SUBJECTIVE: pt forgot to bring his calendar, his nonverbals expressed that he hated he forgot it  Pt accompanied by: self and significant other  PAIN:  Are you having pain? No  PATIENT GOALS: to be able to communicate again  OBJECTIVE:   TODAY'S TREATMENT: Skilled treatment session focused on pt's communication goals. SLP facilitated session by providing the following interventions:  SLP provided instruction and modeling of language facilitation during communication break downs.   PATIENT EDUCATION: Education details: see above Person educated: Patient and Wife Education method: Explanation, Media planner, Verbal cues, and Handouts Education comprehension: verbalized understanding  HOME EXERCISE PROGRAM:  Complete calendar  GOALS: Goals reviewed with patient? Yes  SHORT TERM GOALS: Target date: 10 sessions  UPDATED - 06/24/2022  UPDATED - 07/19/2022  UPDATED - 08/20/2022 Given moderate assistance, pt will answer simple basic yes/no questions with 50% accuracy.  Baseline: 25% Goal status: MET UPDATED goal: Given minimal assistance, pt will answer simple basic yes/no questions with 75% accuracy.  Goal status: MET   2.  Given moderate assistance, pt will follow simple basic 1 step directions with 50% accuracy.  Baseline: 0% Goal status: MET  UPDATED goal: Given minimal assistance, pt will following simple basic 1 step directions with 75% accuracy.   Goal status: MET   3.  With maximal multimodal cues, pt will name set of 10 common objects in 8 out of 10 opportunities.  Baseline: unable Goal status: MET UPDATED goal: With moderate cues, pt will name set of 10 common objects in 8 out of 10 opportunities.  Goal status: MET UPDATED goal: With minimal cues, pt will name set of 10 common objects in 8 out of 10 opportunities.  Goal status: ongoing   4. Pt will improve reading abilities by placing phrase level object function with object in field  of 3 with 50% ability and moderate cues.              Baseline: object name to object - 90%             Goal status: MET  UPDATED goal: Pt will demonstrate reading comprehension at the sentence level for basic information with > 75% accuracy and min A cues.   Goal status: ONGOING  Goal status: ONGOING   5. Pt will improve spelling abilities by completing common words by filling in missing letter with 50% ability and moderate cues.              Baseline: unable             Goal status: MET UPDATED goal: Pt will improve spelling abilities by completing common words by filling in missing letters with >90% accuracy and rare min A cues.  Goal status: MET UPDATED goal: Pt will improve spelling ability by writing names of basic objects with supervision level cues in 8 out of 10 opportunities.  Goal status: ONGOING   6. Pt will repeat rote phrases with 25% speech intelligibility given max to moderate cues.              Baseline: unable             Goal status: MET  UPDATED goal: Pt will repeat rote phrases with ~ 75% speech intelligibility given minimal cues.   Goal status: ONGOING  Goal status: ONGOING    LONG TERM GOALS: Target date: 10/25/2022   Pt will use multimodal communication to express basic wants/needs in 4 of 10 opportunities.  Baseline: unable Goal status: ONGOING   2.  Pt will demonstrate increased reading comprehension by selecting accurate answer from 3 choices in 2 out of 10 opportunities.  Baseline: unable Goal status: ONGOING   3.  Pt will correctly spell his name in 5 out of 10 opportunities.  Baseline: unable Goal status: ONGOING   ASSESSMENT:  CLINICAL IMPRESSION: Pt demonstrated improved ability to participate in VNeSt.   OBJECTIVE IMPAIRMENTS include expressive language, receptive language, aphasia, and apraxia. These impairments are limiting patient from managing medications, managing appointments, managing finances, household responsibilities, ADLs/IADLs,  and effectively communicating at home and in community. Factors affecting potential to achieve goals and functional outcome are severity of impairments. Patient will benefit from skilled SLP services to address above impairments and improve overall function.  REHAB POTENTIAL: Excellent  PLAN: SLP FREQUENCY: 3x/week  SLP DURATION: 12 weeks  PLANNED INTERVENTIONS: Language facilitation, Environmental controls, Internal/external aids, Functional tasks, Multimodal communication approach, SLP instruction and feedback, Compensatory strategies, and Patient/family education   Lambert Jeanty B. Rutherford Nail, M.S., CCC-SLP, Mining engineer Certified Brain Injury [place]  [place] Office [phone] Ascom [phone] Fax [phone]

## 2022-09-12 ENCOUNTER — Ambulatory Visit: Payer: Medicare HMO | Admitting: Dermatology

## 2022-09-13 ENCOUNTER — Ambulatory Visit: Payer: Medicare HMO | Admitting: Speech Pathology

## 2022-09-13 DIAGNOSIS — R482 Apraxia: Secondary | ICD-10-CM | POA: Diagnosis not present

## 2022-09-13 DIAGNOSIS — R4701 Aphasia: Secondary | ICD-10-CM

## 2022-09-13 DIAGNOSIS — I6602 Occlusion and stenosis of left middle cerebral artery: Secondary | ICD-10-CM | POA: Diagnosis not present

## 2022-09-13 NOTE — Therapy (Signed)
OUTPATIENT SPEECH LANGUAGE PATHOLOGY TREATMENT NOTE  10TH VISIT PROGRESS NOTE   Patient Name: Alan Stewart MRN: [id number] DOB:1946/07/02, 77 y.o., male Today's Date: 09/13/2022  PCP: Ramonita Lab, MD REFERRING PROVIDER: Ramonita Lab, MD  Speech Therapy Progress Note  Dates of Reporting Period: 08/20/2022 to 09/13/2022  Objective: Patient has been seen for 10 speech therapy sessions this reporting period targeting pt's language impairments. Patient is making progress toward LTGs and met 3 STGs this reporting period. See skilled intervention, clinical impressions, and goals below for details.   END OF SESSION   End of Session - 09/13/22 1151     Visit Number 40    Number of Visits 60    Date for SLP Re-Evaluation 10/25/22    Authorization Type [employer] HMO    Authorization Time Period 08/26/2022 thru 11/22/2022    Authorization - Visit Number 9    Authorization - Number of Visits 36    Progress Note Due on Visit 42    SLP Start Time 1000    SLP Stop Time  1100    SLP Time Calculation (min) 60 min    Activity Tolerance Patient tolerated treatment well               Past Medical History:  Diagnosis Date   CAD (coronary artery disease)    a. 10/2021 NSTEMI/PCI: LM nl, LAD min irregs, LCX 7m(2.5x18 Onyx Frontier DES), OM1 nl, RCA 965m3.5x26 Onyx Frontier DES), 30d.   Diastolic dysfunction    a. 10/2021 Echo: EF 50-55%, no rwma, Gr1 DD, nl RV fxn. No significant valvular dzs.   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 SCCis. MOHs 07/08/22   Past Surgical History:  Procedure Laterality Date   COLONOSCOPY N/A 10/09/2015   Procedure: COLONOSCOPY;  Surgeon: PaHulen LusterMD;  [facility];  Service: Gastroenterology;  Laterality: N/A;    CORONARY STENT INTERVENTION N/A 10/22/2021   Procedure: CORONARY STENT INTERVENTION;  Surgeon: ArWellington HampshireMD;  [place];  Service: Cardiovascular;  Laterality: N/A;   IR CT HEAD LTD  05/21/2022   IR PERCUTANEOUS ART THROMBECTOMY/INFUSION INTRACRANIAL INC DIAG ANGIO  05/21/2022   LEFT HEART CATH AND CORONARY ANGIOGRAPHY N/A 10/22/2021   Procedure: LEFT HEART CATH AND CORONARY ANGIOGRAPHY;  Surgeon: ArWellington HampshireMD;  [place] [facility];  Service: Cardiovascular;  Laterality: N/A;   LOOP RECORDER INSERTION N/A 05/23/2022   Procedure: LOOP RECORDER INSERTION;  Surgeon: LaVickie EpleyMD;  [place] [facility];  Service: Cardiovascular;  Laterality: N/A;   RADIOLOGY WITH ANESTHESIA N/A 05/20/2022   Procedure: IR WITH ANESTHESIA;  Surgeon: DeLuanne BrasMD;  [place] [facility] Service: Radiology;  Laterality: N/A;   ROTATOR CUFF REPAIR     Patient Active Problem List   Diagnosis Date Noted   Stroke (cerebrum) ([facility]/10/2021   Middle cerebral artery embolism, 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 male with PMH: HTN, CAD, HLD, osteoarthritis, SCC, sleep apnea. He presented to the hospital on 05/20/22 with right sided weakness and aphasia with LKW reported by wife to be 10pm on 10/2.  He [facility] at [facility] as code stroke and taken for emergent CT/CTA which demonstrated left M1 occlusion. He received IV tenecteplase and IR thrombectomy due to large vessel occlusion.       DIAGNOSTIC FINDINGS:  MRI 05/22/2022 Cortical ischemia throughout the left MCA  territory compatible with recent infarct. Relatively little affect on the white matter. 2. Small focus of acute hemorrhage along the anterior surface of the left temporal lobe Heidelberg classification 3c: Subarachnoid hemorrhage. 3. Normal intracranial MRA.  Restored flow in left MCA.    THERAPY DIAG:  Aphasia  Apraxia  Rationale for Evaluation and Treatment Rehabilitation  SUBJECTIVE: pt appeared in a pleasant mood, remembered to bring his calendar today.   Pt accompanied by: self and significant other  PAIN:  Are you having pain? No  PATIENT GOALS: to be able to communicate again  OBJECTIVE:   TODAY'S TREATMENT: Skilled treatment session focused on pt's communication goals. SLP facilitated session by providing the following interventions:  SLP facilitated the use of UNO cards to aid in word finding and awareness. The pt was instructed to say the color of the card he laid down during each turn. The pt showed a lack of awareness throughout the session by referring to multiple colors (yellow, green, blue) as red. SLP utilized the use of a color chart with the colors spelled out to aid the pt in locating and saying the correct color during each turn. Pt requires cueing in the form of initial imitation of words with a step down to phonemic cueing and gesturing. Pt continues to struggle with listening as opposed to jumping in and saying incorrect information.      PATIENT EDUCATION: Education details: see above Person educated: Patient and Wife Education method: Explanation, Media planner, Verbal cues, and Handouts Education comprehension: verbalized understanding  HOME EXERCISE PROGRAM:  Complete calendar  GOALS: Goals reviewed with patient? Yes  SHORT TERM GOALS: Target date: 10 sessions  UPDATED - 06/24/2022  UPDATED - 07/19/2022  UPDATED - 08/20/2022  UPDATED - 09/13/2022 Given moderate assistance, pt will answer simple basic yes/no questions with 50% accuracy.   Baseline: 25% Goal status: MET UPDATED goal: Given minimal assistance, pt will answer simple basic yes/no questions with 75% accuracy.  Goal status: MET   2.  Given moderate assistance, pt will follow simple basic 1 step directions with 50% accuracy.  Baseline: 0% Goal status: MET  UPDATED goal: Given minimal assistance, pt will following simple basic 1 step directions with 75% accuracy.   Goal status: MET   3.  With maximal multimodal cues, pt will name set of 10 common objects in 8 out of 10 opportunities.  Baseline: unable Goal status: MET UPDATED goal: With moderate cues, pt will name set of 10 common objects in 8 out of 10 opportunities.  Goal status: MET UPDATED goal: With minimal cues, pt will name set of 10 common objects in 8 out of 10 opportunities.  Goal status: ongoing    4. Pt will improve reading abilities by placing phrase level object function with object in field of 3 with 50% ability and moderate cues.  Baseline: object name to object - 90%             Goal status: MET  UPDATED goal: Pt will demonstrate reading comprehension at the sentence level for basic information with > 75% accuracy and min A cues.   Goal status: ONGOING  Goal status: ONGOING  UPDATED GOAL: 09/13/2022  Pt will demonstrate reading comprehension at the sentence level for ADLs/iADLS with >82% accuracy given min A cues.    5. Pt will improve spelling abilities by completing common words by filling in missing letter with 50% ability and moderate cues.              Baseline: unable             Goal status: MET UPDATED goal: Pt will improve spelling abilities by completing common words by filling in missing letters with >90% accuracy and rare min A cues.  Goal status: MET UPDATED goal: Pt will improve spelling ability by writing names of basic objects with supervision level cues in 8 out of 10 opportunities.  Goal status: ONGOING UPDATED GOAL: 09/13/2022 Pt will improve spelling  ability by accurately writing sentence level description of his ADLs/iADLs with > 75% accuracy with Min A cues.    6. Pt will repeat rote phrases with 25% speech intelligibility given max to moderate cues.              Baseline: unable             Goal status: MET  UPDATED goal: Pt will repeat rote phrases with ~ 75% speech intelligibility given minimal cues.   Goal status: ONGOING  Goal status: ONGOING  Goal status: MET  UPDATED GOAL: 09/13/2022  Pt will read sentences related to his ADLs/iADLs with > 75% speech intelligibility and moderate assistance.     LONG TERM GOALS: Target date: 10/25/2022   Pt will use multimodal communication to express basic wants/needs in 4 of 10 opportunities.  Baseline: unable Goal status: ONGOING   2.  Pt will demonstrate increased reading comprehension by selecting accurate answer from 3 choices in 2 out of 10 opportunities.  Baseline: unable Goal status: ONGOING   3.  Pt will correctly spell his name in 5 out of 10 opportunities.  Baseline: unable Goal status: MET   ASSESSMENT:  CLINICAL IMPRESSION: Pt continues to exhibit moderate expressive communication impairment but is observed using longer sentences during conversation and activities. He continues to require max to moderate multi-modal assistance for speech intelligibility d/t receptive language deficits and deficits in emergent awareness.   OBJECTIVE IMPAIRMENTS include expressive language, receptive language, aphasia, and apraxia. These impairments are limiting patient from managing medications, managing appointments, managing finances, household responsibilities, ADLs/IADLs, and effectively communicating at home and in community. Factors affecting potential to achieve goals and functional outcome are severity of impairments. Patient will benefit from skilled SLP services to address above impairments and improve overall function.  REHAB POTENTIAL: Excellent  PLAN: SLP FREQUENCY:  3x/week  SLP DURATION: 12 weeks  PLANNED INTERVENTIONS: Language facilitation, Environmental controls, Internal/external aids, Functional tasks, Multimodal communication approach, SLP instruction and feedback, Compensatory strategies, and Patient/family education   Happi B. Rutherford Nail, M.S., CCC-SLP, Mining engineer Certified Brain Injury [place]  [place] Office [phone] Ascom [phone] Fax [phone]

## 2022-09-14 ENCOUNTER — Other Ambulatory Visit: Payer: Self-pay | Admitting: Nurse Practitioner

## 2022-09-16 ENCOUNTER — Ambulatory Visit: Payer: Medicare HMO | Admitting: Speech Pathology

## 2022-09-16 DIAGNOSIS — I6602 Occlusion and stenosis of left middle cerebral artery: Secondary | ICD-10-CM

## 2022-09-16 DIAGNOSIS — R4701 Aphasia: Secondary | ICD-10-CM

## 2022-09-16 DIAGNOSIS — R482 Apraxia: Secondary | ICD-10-CM | POA: Diagnosis not present

## 2022-09-17 NOTE — Therapy (Signed)
OUTPATIENT SPEECH LANGUAGE PATHOLOGY TREATMENT NOTE     Patient Name: Alan Stewart MRN: [id number] DOB:Jan 01, 1946, 77 y.o., male Today's Date: 09/17/2022  PCP: Ramonita Lab, MD REFERRING PROVIDER: Ramonita Lab, MD  END OF SESSION   End of Session - 09/17/22 0900     Visit Number 41    Number of Visits 13    Date for SLP Re-Evaluation 10/25/22    Authorization Type [employer] HMO    Authorization Time Period 08/26/2022 thru 11/22/2022    Authorization - Visit Number 10    Authorization - Number of Visits 36    Progress Note Due on Visit 61    SLP Start Time 1100    SLP Stop Time  1200    SLP Time Calculation (min) 60 min    Activity Tolerance Patient tolerated treatment well               Past Medical History:  Diagnosis Date   CAD (coronary artery disease)    a. 10/2021 NSTEMI/PCI: LM nl, LAD min irregs, LCX 69m(2.5x18 Onyx Frontier DES), OM1 nl, RCA 938m3.5x26 Onyx Frontier DES), 30d.   Diastolic dysfunction    a. 10/2021 Echo: EF 50-55%, no rwma, Gr1 DD, nl RV fxn. No significant valvular dzs.   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 SCCis. MOHs 07/08/22   Past Surgical History:  Procedure Laterality Date   COLONOSCOPY N/A 10/09/2015   Procedure: COLONOSCOPY;  Surgeon: PaHulen LusterMD;  [facility];  Service: Gastroenterology;  Laterality: N/A;   CORONARY STENT INTERVENTION N/A 10/22/2021   Procedure: CORONARY STENT INTERVENTION;  Surgeon: ArWellington HampshireMD;  [facility];  Service: Cardiovascular;  Laterality: N/A;   IR CT HEAD LTD  05/21/2022   IR PERCUTANEOUS ART THROMBECTOMY/INFUSION INTRACRANIAL INC DIAG ANGIO  05/21/2022   LEFT HEART CATH AND CORONARY ANGIOGRAPHY N/A 10/22/2021   Procedure: LEFT HEART CATH AND  CORONARY ANGIOGRAPHY;  Surgeon: ArWellington HampshireMD;  [place] [facility];  Service: Cardiovascular;  Laterality: N/A;   LOOP RECORDER INSERTION N/A 05/23/2022   Procedure: LOOP RECORDER INSERTION;  Surgeon: LaVickie EpleyMD;  [place] [facility];  Service: Cardiovascular;  Laterality: N/A;   RADIOLOGY WITH ANESTHESIA N/A 05/20/2022   Procedure: IR WITH ANESTHESIA;  Surgeon: DeLuanne BrasMD;  [place] [facility] Service: Radiology;  Laterality: N/A;   ROTATOR CUFF REPAIR     Patient Active Problem List   Diagnosis Date Noted   Stroke (cerebrum) (HCSw[facility]10/10/2021   Middle cerebral artery embolism, 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 male with PMH: HTN, CAD, HLD, osteoarthritis, SCC, sleep apnea. He presented to the hospital on 05/20/22 with right sided weakness and aphasia with LKW reported by wife to be 10pm on 10/2.  He arrived at  [facility] as code stroke and taken for emergent CT/CTA which demonstrated left M1 occlusion. He received IV tenecteplase and IR thrombectomy due to large vessel occlusion.       DIAGNOSTIC FINDINGS:  MRI 05/22/2022 Cortical ischemia throughout the left MCA territory compatible with recent infarct. Relatively little affect on the white matter. 2. Small focus of acute hemorrhage along the anterior surface of the left temporal lobe Heidelberg classification 3c: Subarachnoid hemorrhage. 3. Normal intracranial MRA.  Restored flow in left MCA.    THERAPY DIAG:  Aphasia  Apraxia  Middle cerebral artery embolism, left  Rationale for Evaluation and  Treatment Rehabilitation  SUBJECTIVE: pt appeared in a pleasant mood, remembered to bring his calendar today.   Pt accompanied by: self and significant other  PAIN:  Are you having pain? No  PATIENT GOALS: to be able to communicate again  OBJECTIVE:   TODAY'S TREATMENT: Skilled treatment session focused on pt's communication goals. SLP facilitated session by providing the following interventions:  Modified V-Nest utilized to formulate sentences describing pt's (and his wife's) ADLs/iADLs for use in pt's calendar/journal. Pt and his wife report that pt has been completing by himself. When reviewing the journal, pt was noted to be missing words as he is no longer using the written phrases that were provided. Pt is also not practicing his speech intelligibility. SLP used already established phrases to build sentences. Moderate to minimal cues required to complete V-Nest. SLP further facilitated session by providing moderate verbal models to achieve ~ 75% speech intelligibility with common nouns/verbs. Of note, pt unable to produce past tense of verbs ending with /k/ therefore "cooked' was changed to "made."  While pt demonstrated improved speech intelligibility of colors, he is reliant on max to moderate cues to for error awareness.    PATIENT EDUCATION: Education details: see above Person educated: Patient and Wife Education method: Explanation, Media planner, Verbal cues, and Handouts Education comprehension: verbalized understanding  HOME EXERCISE PROGRAM:  Complete calendar  GOALS: Goals reviewed with patient? Yes  SHORT TERM GOALS: Target date: 10 sessions  UPDATED - 06/24/2022  UPDATED - 07/19/2022  UPDATED - 08/20/2022  UPDATED - 09/13/2022 Given moderate assistance, pt will answer simple basic yes/no questions with 50% accuracy.  Baseline: 25% Goal status: MET UPDATED goal: Given minimal assistance, pt will answer simple basic yes/no questions with 75% accuracy.  Goal  status: MET   2.  Given moderate assistance, pt will follow simple basic 1 step directions with 50% accuracy.  Baseline: 0% Goal status: MET  UPDATED goal: Given minimal assistance, pt will following simple basic 1 step directions with 75% accuracy.   Goal status: MET   3.  With maximal multimodal cues, pt will name set of 10 common objects in 8 out of 10 opportunities.  Baseline: unable Goal status: MET UPDATED goal: With moderate cues, pt will name set of 10 common objects in 8 out of 10 opportunities.  Goal status: MET UPDATED goal: With minimal cues, pt will name set of 10 common objects in 8 out of 10 opportunities.  Goal status: ongoing    4. Pt will improve reading abilities by placing phrase level object function with object in field of 3 with 50% ability and moderate cues.              Baseline: object name to object - 90%             Goal status: MET  UPDATED goal: Pt will demonstrate reading comprehension at  the sentence level for basic information with > 75% accuracy and min A cues.   Goal status: ONGOING  Goal status: ONGOING  UPDATED GOAL: 09/13/2022  Pt will demonstrate reading comprehension at the sentence level for ADLs/iADLS with >59% accuracy given min A cues.    5. Pt will improve spelling abilities by completing common words by filling in missing letter with 50% ability and moderate cues.              Baseline: unable             Goal status: MET UPDATED goal: Pt will improve spelling abilities by completing common words by filling in missing letters with >90% accuracy and rare min A cues.  Goal status: MET UPDATED goal: Pt will improve spelling ability by writing names of basic objects with supervision level cues in 8 out of 10 opportunities.  Goal status: ONGOING UPDATED GOAL: 09/13/2022 Pt will improve spelling ability by accurately writing sentence level description of his ADLs/iADLs with > 75% accuracy with Min A cues.    6. Pt will repeat rote phrases  with 25% speech intelligibility given max to moderate cues.              Baseline: unable             Goal status: MET  UPDATED goal: Pt will repeat rote phrases with ~ 75% speech intelligibility given minimal cues.   Goal status: ONGOING  Goal status: ONGOING  Goal status: MET  UPDATED GOAL: 09/13/2022  Pt will read sentences related to his ADLs/iADLs with > 75% speech intelligibility and moderate assistance.     LONG TERM GOALS: Target date: 10/25/2022   Pt will use multimodal communication to express basic wants/needs in 4 of 10 opportunities.  Baseline: unable Goal status: ONGOING   2.  Pt will demonstrate increased reading comprehension by selecting accurate answer from 3 choices in 2 out of 10 opportunities.  Baseline: unable Goal status: ONGOING   3.  Pt will correctly spell his name in 5 out of 10 opportunities.  Baseline: unable Goal status: MET   ASSESSMENT:  CLINICAL IMPRESSION: Pt continues to exhibit moderate expressive communication impairment but is observed using longer sentences during conversation and activities. He continues to require max to moderate multi-modal assistance for speech intelligibility d/t receptive language deficits and deficits in emergent awareness.   OBJECTIVE IMPAIRMENTS include expressive language, receptive language, aphasia, and apraxia. These impairments are limiting patient from managing medications, managing appointments, managing finances, household responsibilities, ADLs/IADLs, and effectively communicating at home and in community. Factors affecting potential to achieve goals and functional outcome are severity of impairments. Patient will benefit from skilled SLP services to address above impairments and improve overall function.  REHAB POTENTIAL: Excellent  PLAN: SLP FREQUENCY: 3x/week  SLP DURATION: 12 weeks  PLANNED INTERVENTIONS: Language facilitation, Environmental controls, Internal/external aids, Functional tasks,  Multimodal communication approach, SLP instruction and feedback, Compensatory strategies, and Patient/family education   Preet Perrier B. Rutherford Nail, M.S., CCC-SLP, Mining engineer Certified Brain Injury [place]  [place] Office [phone] Ascom [phone] Fax [phone]

## 2022-09-18 ENCOUNTER — Ambulatory Visit: Payer: Medicare HMO | Admitting: Speech Pathology

## 2022-09-18 DIAGNOSIS — R4701 Aphasia: Secondary | ICD-10-CM

## 2022-09-18 DIAGNOSIS — R482 Apraxia: Secondary | ICD-10-CM | POA: Diagnosis not present

## 2022-09-18 DIAGNOSIS — I6602 Occlusion and stenosis of left middle cerebral artery: Secondary | ICD-10-CM | POA: Diagnosis not present

## 2022-09-18 NOTE — Therapy (Addendum)
OUTPATIENT SPEECH LANGUAGE PATHOLOGY TREATMENT NOTE     Patient Name: Alan Stewart MRN: [id number] DOB:Sep 04, 1945, 77 y.o., male Today's Date: 09/18/2022  PCP: Ramonita Lab, MD REFERRING PROVIDER: Ramonita Lab, MD  END OF SESSION   End of Session - 09/18/22 1217     Visit Number 42    Number of Visits 78    Date for SLP Re-Evaluation 10/25/22    Authorization Type [employer] H[employer]    Authorization Time Period 08/26/2022 thru 11/22/2022    Authorization - Visit Number 11    Authorization - Number of Visits 36    Progress Note Due on Visit 69    SLP Start Time 1000    SLP Stop Time  1100    SLP Time Calculation (min) 60 min    Activity Tolerance Patient tolerated treatment well               Past Medical History:  Diagnosis Date   CAD (coronary artery disease)    a. 10/2021 NSTEMI/PCI: LM nl, LAD min irregs, LCX 60m(2.5x18 Onyx Frontier DES), OM1 nl, RCA 978m3.5x26 Onyx Frontier DES), 30d.   Diastolic dysfunction    a. 10/2021 Echo: EF 50-55%, no rwma, Gr1 DD, nl RV fxn. No significant valvular dzs.   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 SCCis. MOHs 07/08/22   Past Surgical History:  Procedure Laterality Date   COLONOSCOPY N/A 10/09/2015   Procedure: COLONOSCOPY;  Surgeon: PaHulen LusterMD;  [facility];  Service: Gastroenterology;  Laterality: N/A;   CORONARY STENT INTERVENTION N/A 10/22/2021   Procedure: CORONARY STENT INTERVENTION;  Surgeon: ArWellington HampshireMD;  [facility];  Service: Cardiovascular;  Laterality: N/A;   IR CT HEAD LTD  05/21/2022   IR PERCUTANEOUS ART THROMBECTOMY/INFUSION INTRACRANIAL INC DIAG ANGIO  05/21/2022   LEFT HEART CATH AND CORONARY ANGIOGRAPHY N/A 10/22/2021   Procedure: LEFT HEART CATH AND  CORONARY ANGIOGRAPHY;  Surgeon: ArWellington HampshireMD;  [facility];  Service: Cardiovascular;  Laterality: N/A;   LOOP RECORDER INSERTION N/A 05/23/2022   Procedure: LOOP RECORDER INSERTION;  Surgeon: LaVickie EpleyMD;  [facility];  Service: Cardiovascular;  Laterality: N/A;   RADIOLOGY WITH ANESTHESIA N/A 05/20/2022   Procedure: IR WITH ANESTHESIA;  Surgeon: DeLuanne BrasMD;  [facility] Service: Radiology;  Laterality: N/A;   ROTATOR CUFF REPAIR     Patient Active Problem List   Diagnosis Date Noted   Stroke (cerebrum) ([facility]10/10/2021   Middle cerebral artery embolism, 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 male with PMH: HTN, CAD, HLD, osteoarthritis, SCC, sleep apnea. He presented to the hospital on 05/20/22 with right sided weakness and aphasia with LKW reported by wife to be 10pm on 10/2.  He arrived at  [facility] as code stroke and taken for emergent CT/CTA which demonstrated left M1 occlusion. He received IV tenecteplase and IR thrombectomy due to large vessel occlusion.       DIAGNOSTIC FINDINGS:  MRI 05/22/2022 Cortical ischemia throughout the left MCA territory compatible with recent infarct. Relatively little affect on the white matter. 2. Small focus of acute hemorrhage along the anterior surface of the left temporal lobe Heidelberg classification 3c: Subarachnoid hemorrhage. 3. Normal intracranial MRA.  Restored flow in left MCA.    THERAPY DIAG:  Aphasia  Apraxia  Middle cerebral artery embolism, left  Rationale for Evaluation and  Treatment Rehabilitation  SUBJECTIVE: pt appeared in a pleasant mood, remembered to bring his calendar today.   Pt accompanied by: self and significant other  PAIN:  Are you having pain? No  PATIENT GOALS: to be able to communicate again  OBJECTIVE:   TODAY'S TREATMENT: Skilled treatment session focused on pt's communication goals. SLP facilitated session by providing the following interventions:  Naming colors - red, green, yellow, blue cards: Pt independently named 63% of the time improving to 100% with phonemic cues. Pt unable to name the cards independently. Pt's errors consisted of perseverative responses with little emergent awareness of errors. In an effort to fade phonemic cues, sentence completion was provided using sports teams. Pt responded with 100%.   To target vocabulary within daily activities specific to patient.   Pharmacist, hospital ([facility]) was utilized. The pt generated 3 subjects and objects for basic verbs, for a total of 6 subject/objects. Pt required minimal cues. Pt generated 10 basic by answering "wh" questions.   I cleaned the kitchen Bonnita Nasuti went to work I took the recycling out. I made the bed. I took the garbage out.  I went to speech.  Helen made supper I made supper I made breakfast Bonnita Nasuti made breakfast  SLP further facilitated session by modeling and assisting his wife with using the above created sentences within pt's calendar to promote accurate spelling, sentence length written expression, auditory bombardment of words targeting vocabulary surrounding ADLs.     PATIENT EDUCATION: Education details: see above Person educated: Patient and Wife Education method: Explanation, Media planner, Verbal cues, and Handouts Education comprehension: verbalized understanding  HOME EXERCISE PROGRAM:  Complete calendar  GOALS: Goals reviewed with patient? Yes  SHORT TERM GOALS: Target date: 10 sessions  UPDATED - 06/24/2022  UPDATED -  07/19/2022  UPDATED - 08/20/2022  UPDATED - 09/13/2022 Given moderate assistance, pt will answer simple basic yes/no questions with 50% accuracy.  Baseline: 25% Goal status: MET UPDATED goal: Given minimal assistance, pt will answer simple basic yes/no questions with 75% accuracy.  Goal status: MET   2.  Given moderate assistance, pt will follow simple basic 1 step directions with 50% accuracy.  Baseline: 0% Goal status: MET  UPDATED goal: Given minimal assistance, pt will following simple basic 1 step directions with 75% accuracy.   Goal status: MET   3.  With maximal multimodal cues, pt will name set of 10 common objects in 8 out of 10 opportunities.  Baseline: unable Goal status: MET UPDATED goal: With moderate cues, pt will name set of 10 common objects in 8 out of 10 opportunities.  Goal status: MET UPDATED goal: With minimal cues, pt will name set of 10 common objects in 8 out of 10 opportunities.  Goal status: ongoing    4. Pt will improve reading abilities by placing phrase level object function with object in field of  3 with 50% ability and moderate cues.              Baseline: object name to object - 90%             Goal status: MET  UPDATED goal: Pt will demonstrate reading comprehension at the sentence level for basic information with > 75% accuracy and min A cues.   Goal status: ONGOING  Goal status: ONGOING  UPDATED GOAL: 09/13/2022  Pt will demonstrate reading comprehension at the sentence level for ADLs/iADLS with >29% accuracy given min A cues.    5. Pt will improve spelling abilities by completing common words by filling in missing letter with 50% ability and moderate cues.              Baseline: unable             Goal status: MET UPDATED goal: Pt will improve spelling abilities by completing common words by filling in missing letters with >90% accuracy and rare min A cues.  Goal status: MET UPDATED goal: Pt will improve spelling ability by writing names of  basic objects with supervision level cues in 8 out of 10 opportunities.  Goal status: ONGOING UPDATED GOAL: 09/13/2022 Pt will improve spelling ability by accurately writing sentence level description of his ADLs/iADLs with > 75% accuracy with Min A cues.    6. Pt will repeat rote phrases with 25% speech intelligibility given max to moderate cues.              Baseline: unable             Goal status: MET  UPDATED goal: Pt will repeat rote phrases with ~ 75% speech intelligibility given minimal cues.   Goal status: ONGOING  Goal status: ONGOING  Goal status: MET  UPDATED GOAL: 09/13/2022  Pt will read sentences related to his ADLs/iADLs with > 75% speech intelligibility and moderate assistance.     LONG TERM GOALS: Target date: 10/25/2022   Pt will use multimodal communication to express basic wants/needs in 4 of 10 opportunities.  Baseline: unable Goal status: ONGOING   2.  Pt will demonstrate increased reading comprehension by selecting accurate answer from 3 choices in 2 out of 10 opportunities.  Baseline: unable Goal status: ONGOING   3.  Pt will correctly spell his name in 5 out of 10 opportunities.  Baseline: unable Goal status: MET   ASSESSMENT:  CLINICAL IMPRESSION: Pt continues to exhibit moderate expressive communication impairment but is observed using longer sentences during conversation and activities. He continues to require max to moderate multi-modal assistance for speech intelligibility d/t receptive language deficits and deficits in emergent awareness.   OBJECTIVE IMPAIRMENTS include expressive language, receptive language, aphasia, and apraxia. These impairments are limiting patient from managing medications, managing appointments, managing finances, household responsibilities, ADLs/IADLs, and effectively communicating at home and in community. Factors affecting potential to achieve goals and functional outcome are severity of impairments. Patient will benefit  from skilled SLP services to address above impairments and improve overall function.  REHAB POTENTIAL: Excellent  PLAN: SLP FREQUENCY: 3x/week  SLP DURATION: 12 weeks  PLANNED INTERVENTIONS: Language facilitation, Environmental controls, Internal/external aids, Functional tasks, Multimodal communication approach, SLP instruction and feedback, Compensatory strategies, and Patient/family education   Lowen Barringer B. Rutherford Nail, M.S., CCC-SLP, Mining engineer Certified Brain Injury [place]  [place] Office [phone] Ascom [phone] Fax [phone]

## 2022-09-19 ENCOUNTER — Ambulatory Visit: Payer: Medicare HMO | Attending: Internal Medicine | Admitting: Speech Pathology

## 2022-09-19 DIAGNOSIS — I6602 Occlusion and stenosis of left middle cerebral artery: Secondary | ICD-10-CM | POA: Diagnosis not present

## 2022-09-19 DIAGNOSIS — R482 Apraxia: Secondary | ICD-10-CM | POA: Diagnosis not present

## 2022-09-19 DIAGNOSIS — R4701 Aphasia: Secondary | ICD-10-CM | POA: Diagnosis not present

## 2022-09-19 NOTE — Therapy (Signed)
OUTPATIENT SPEECH LANGUAGE PATHOLOGY TREATMENT NOTE     Patient Name: Alan Stewart MRN: [id number] DOB:[birth year], 77 y.o., male Today's Date: 09/19/2022  PCP: Ramonita Lab, MD REFERRING PROVIDER: Ramonita Lab, MD  END OF SESSION   End of Session - 09/19/22 1330     Visit Number 22    Number of Visits 31    Date for SLP Re-Evaluation 10/25/22    Authorization Type [employer] HMO    Authorization Time Period 08/26/2022 thru 11/22/2022    Authorization - Visit Number 12    Authorization - Number of Visits 36    Progress Note Due on Visit 61    SLP Start Time 1000    SLP Stop Time  1100    SLP Time Calculation (min) 60 min    Activity Tolerance Patient tolerated treatment well               Past Medical History:  Diagnosis Date   CAD (coronary artery disease)    a. 10/2021 NSTEMI/PCI: LM nl, LAD min irregs, LCX 76m(2.5x18 Onyx Frontier DES), OM1 nl, RCA 969m3.5x26 Onyx Frontier DES), 30d.   Diastolic dysfunction    a. 10/2021 Echo: EF 50-55%, no rwma, Gr1 DD, nl RV fxn. No significant valvular dzs.   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 SCCis. MOHs 07/08/22   Past Surgical History:  Procedure Laterality Date   COLONOSCOPY N/A 10/09/2015   Procedure: COLONOSCOPY;  Surgeon: PaHulen LusterMD;  [facility];  Service: Gastroenterology;  Laterality: N/A;   CORONARY STENT INTERVENTION N/A 10/22/2021   Procedure: CORONARY STENT INTERVENTION;  Surgeon: ArWellington HampshireMD;  [facility];  Service: Cardiovascular;  Laterality: N/A;   IR CT HEAD LTD  05/21/2022   IR PERCUTANEOUS ART THROMBECTOMY/INFUSION INTRACRANIAL INC DIAG ANGIO  05/21/2022   LEFT HEART CATH AND CORONARY ANGIOGRAPHY N/A 10/22/2021   Procedure: LEFT HEART CATH AND  CORONARY ANGIOGRAPHY;  Surgeon: ArWellington HampshireMD;  [facility];  Service: Cardiovascular;  Laterality: N/A;   LOOP RECORDER INSERTION N/A 05/23/2022   Procedure: LOOP RECORDER INSERTION;  Surgeon: LaVickie EpleyMD;  [facility];  Service: Cardiovascular;  Laterality: N/A;   RADIOLOGY WITH ANESTHESIA N/A 05/20/2022   Procedure: IR WITH ANESTHESIA;  Surgeon: DeLuanne BrasMD;  [facility] [facility] Service: Radiology;  Laterality: N/A;   ROTATOR CUFF REPAIR     Patient Active Problem List   Diagnosis Date Noted   Stroke (cerebrum) (HCMcEwensville10/10/2021   Middle cerebral artery embolism, 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 male with PMH: HTN, CAD, HLD, osteoarthritis, SCC, sleep apnea. He presented to the hospital on 05/20/22 with right sided weakness and aphasia with LKW reported by wife to be 10pm on 10/2.  He arrived at  [facility] as code stroke and taken for emergent CT/CTA which demonstrated left M1 occlusion. He received IV tenecteplase and IR thrombectomy due to large vessel occlusion.       DIAGNOSTIC FINDINGS:  MRI 05/22/2022 Cortical ischemia throughout the left MCA territory compatible with recent infarct. Relatively little affect on the white matter. 2. Small focus of acute hemorrhage along the anterior surface of the left temporal lobe Heidelberg classification 3c: Subarachnoid hemorrhage. 3. Normal intracranial MRA.  Restored flow in left MCA.    THERAPY DIAG:  Aphasia  Apraxia  Middle cerebral artery embolism, left  Rationale for Evaluation and  Treatment Rehabilitation  SUBJECTIVE: pt appeared in a pleasant mood, remembered to bring his calendar today.   Pt accompanied by: self and significant other  PAIN:  Are you having pain? No  PATIENT GOALS: to be able to communicate again  OBJECTIVE:   TODAY'S TREATMENT: Skilled treatment session focused on pt's communication goals. SLP facilitated session by providing the following interventions:   To target vocabulary within daily activities specific to patient.   Pharmacist, hospital ([facility]) was utilized. The pt generated 3 subjects and objects for basic verbs, for a total of 6 subject/objects. Pt required minimal cues. Pt generated 10 basic by answering "wh" questions.   I cleaned the kitchen Bonnita Nasuti went to work I took the recycling out. I made the bed. I took the garbage out.  I went to speech.  Bonnita Nasuti made supper I made supper I made breakfast Bonnita Nasuti made breakfast  Pt had not chosen all of the tasks that he had completed. Further his wife states that pt was reluctant to completing calendar. SLP facilitated session by presenting pt with all written choices of activities. Pt required minimal cues to select ALL activities and he required minimal cues to select the correct activities.  SLP further facilitated session by modeling and assisting his wife with using the above created sentences within pt's calendar to promote accurate spelling, sentence length written expression, auditory bombardment of words targeting vocabulary surrounding ADLs.   COLORS: Pt able to select requested specified color from field of 4 colors with 90% accuracy. Increased to selecting 2 specified colors pt with 84% Maximal faded to moderate multimodal cues required to increase expressive abilities; activity used to also increase pt's self-awareness of incorrect perseverative responses.   He required physical cues to place his hand over his mouth, point to the color and then slowly begin  correct motor movement for specified color.     PATIENT EDUCATION: Education details: see above Person educated: Patient and Wife Education method: Explanation, Media planner, Verbal cues, and Handouts Education comprehension: verbalized understanding  HOME EXERCISE PROGRAM:  Complete calendar  GOALS: Goals reviewed with patient? Yes  SHORT TERM GOALS: Target date: 10 sessions  UPDATED - 06/24/2022  UPDATED - 07/19/2022  UPDATED - 08/20/2022  UPDATED - 09/13/2022 Given moderate assistance, pt will answer simple basic yes/no questions with 50% accuracy.  Baseline: 25% Goal status: MET UPDATED goal: Given minimal assistance, pt will answer simple basic yes/no questions with 75% accuracy.  Goal status: MET   2.  Given moderate assistance, pt will follow simple basic 1 step directions with 50% accuracy.  Baseline: 0% Goal status: MET  UPDATED goal: Given minimal assistance, pt will following simple basic 1 step directions with 75% accuracy.   Goal status: MET   3.  With maximal multimodal cues, pt will name set of 10 common objects in 8 out of 10 opportunities.  Baseline: unable Goal status: MET UPDATED goal: With moderate cues, pt will name set of 10 common objects in 8 out of 10 opportunities.  Goal status: MET UPDATED goal: With minimal cues, pt will name set of 10 common objects in 8 out of 10 opportunities.  Goal status: ongoing    4. Pt will improve reading abilities by placing phrase level object function with object in field of 3 with 50% ability and moderate cues.              Baseline: object name to object - 90%             Goal status: MET  UPDATED goal: Pt will demonstrate reading comprehension at the sentence level for basic information with > 75% accuracy and min A cues.   Goal status: ONGOING  Goal status: ONGOING  UPDATED GOAL: 09/13/2022  Pt will demonstrate reading comprehension at the sentence level for ADLs/iADLS with >97% accuracy given min A cues.     5. Pt will improve spelling abilities by completing common words by filling in missing letter with 50% ability and moderate cues.              Baseline: unable             Goal status: MET UPDATED goal: Pt will improve spelling abilities by completing common words by filling in missing letters with >90% accuracy and rare min A cues.  Goal status: MET UPDATED goal: Pt will improve spelling ability by writing names of basic objects with supervision level cues in 8 out of 10 opportunities.  Goal status: ONGOING UPDATED GOAL: 09/13/2022 Pt will improve spelling ability by accurately writing sentence level description of his ADLs/iADLs with > 75% accuracy with Min A cues.    6. Pt will repeat rote phrases with 25% speech intelligibility given max to moderate cues.              Baseline: unable             Goal status: MET  UPDATED goal: Pt will repeat rote phrases with ~ 75% speech intelligibility given minimal cues.   Goal status: ONGOING  Goal status: ONGOING  Goal status: MET  UPDATED GOAL: 09/13/2022  Pt will read sentences related to his ADLs/iADLs with > 75% speech intelligibility and moderate assistance.     LONG TERM GOALS: Target date: 10/25/2022   Pt will use multimodal communication to express basic wants/needs in 4 of 10 opportunities.  Baseline: unable Goal status: ONGOING   2.  Pt will demonstrate increased reading comprehension by selecting accurate answer from 3 choices in 2 out of 10 opportunities.  Baseline: unable Goal status: ONGOING   3.  Pt will correctly spell his name in 5 out of 10 opportunities.  Baseline: unable Goal status: MET   ASSESSMENT:  CLINICAL IMPRESSION: Pt continues to exhibit moderate expressive communication impairment but is observed using longer sentences during conversation and activities. He continues to require max to moderate multi-modal assistance for speech intelligibility d/t receptive language deficits and deficits in emergent  awareness.   OBJECTIVE IMPAIRMENTS include expressive language, receptive language, aphasia, and apraxia. These impairments are limiting patient from managing medications, managing appointments, managing finances, household responsibilities, ADLs/IADLs, and effectively communicating at home and in community. Factors affecting potential to achieve goals and functional outcome are severity of impairments. Patient will benefit from skilled SLP services to address above impairments and improve overall function.  REHAB POTENTIAL: Excellent  PLAN: SLP  FREQUENCY: 3x/week  SLP DURATION: 12 weeks  PLANNED INTERVENTIONS: Language facilitation, Environmental controls, Internal/external aids, Functional tasks, Multimodal communication approach, SLP instruction and feedback, Compensatory strategies, and Patient/family education   Jaiyon Wander B. Rutherford Nail, M.S., CCC-SLP, Mining engineer Certified Brain Injury [place]  [place] Office [phone] Ascom [phone] Fax [phone]

## 2022-09-20 ENCOUNTER — Ambulatory Visit: Payer: Medicare HMO | Admitting: Speech Pathology

## 2022-09-23 ENCOUNTER — Ambulatory Visit: Payer: Medicare HMO

## 2022-09-23 ENCOUNTER — Ambulatory Visit: Payer: Medicare HMO | Admitting: Speech Pathology

## 2022-09-23 DIAGNOSIS — R4701 Aphasia: Secondary | ICD-10-CM

## 2022-09-23 DIAGNOSIS — I63412 Cerebral infarction due to embolism of left middle cerebral artery: Secondary | ICD-10-CM

## 2022-09-23 DIAGNOSIS — R482 Apraxia: Secondary | ICD-10-CM

## 2022-09-23 DIAGNOSIS — I6602 Occlusion and stenosis of left middle cerebral artery: Secondary | ICD-10-CM | POA: Diagnosis not present

## 2022-09-23 NOTE — Therapy (Signed)
OUTPATIENT SPEECH LANGUAGE PATHOLOGY TREATMENT NOTE     Patient Name: Alan Stewart MRN: [id number] DOB:[birth year], 77 y.o., male Today's Date: 09/23/2022  PCP: Ramonita Lab, MD REFERRING PROVIDER: Ramonita Lab, MD  END OF SESSION   End of Session - 09/23/22 0907     Visit Number 67    Number of Visits 27    Date for SLP Re-Evaluation 10/25/22    Authorization Type [employer] HMO    Authorization Time Period 08/26/2022 thru 11/22/2022    Authorization - Visit Number 6    Authorization - Number of Visits 36    Progress Note Due on Visit 78    SLP Start Time 0900    SLP Stop Time  1000    SLP Time Calculation (min) 60 min    Activity Tolerance Patient tolerated treatment well               Past Medical History:  Diagnosis Date   CAD (coronary artery disease)    a. 10/2021 NSTEMI/PCI: LM nl, LAD min irregs, LCX 10m(2.5x18 Onyx Frontier DES), OM1 nl, RCA 98m3.5x26 Onyx Frontier DES), 30d.   Diastolic dysfunction    a. 10/2021 Echo: EF 50-55%, no rwma, Gr1 DD, nl RV fxn. No significant valvular dzs.   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 SCCis. MOHs 07/08/22   Past Surgical History:  Procedure Laterality Date   COLONOSCOPY N/A 10/09/2015   Procedure: COLONOSCOPY;  Surgeon: PaHulen LusterMD;  [facility];  Service: Gastroenterology;  Laterality: N/A;   CORONARY STENT INTERVENTION N/A 10/22/2021   Procedure: CORONARY STENT INTERVENTION;  Surgeon: ArWellington HampshireMD;  [facility];  Service: Cardiovascular;  Laterality: N/A;   IR CT HEAD LTD  05/21/2022   IR PERCUTANEOUS ART THROMBECTOMY/INFUSION INTRACRANIAL INC DIAG ANGIO  05/21/2022   LEFT HEART CATH AND CORONARY ANGIOGRAPHY N/A 10/22/2021   Procedure: LEFT HEART CATH AND  CORONARY ANGIOGRAPHY;  Surgeon: ArWellington HampshireMD;  [facility];  Service: Cardiovascular;  Laterality: N/A;   LOOP RECORDER INSERTION N/A 05/23/2022   Procedure: LOOP RECORDER INSERTION;  Surgeon: LaVickie EpleyMD;  [facility];  Service: Cardiovascular;  Laterality: N/A;   RADIOLOGY WITH ANESTHESIA N/A 05/20/2022   Procedure: IR WITH ANESTHESIA;  Surgeon: DeLuanne BrasMD;  [facility] Service: Radiology;  Laterality: N/A;   ROTATOR CUFF REPAIR     Patient Active Problem List   Diagnosis Date Noted   Stroke (cerebrum) ([place]10/10/2021   Middle cerebral artery embolism, 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 male with PMH: HTN, CAD, HLD, osteoarthritis, SCC, sleep apnea. He presented to the hospital on 05/20/22 with right sided weakness and aphasia with LKW reported by wife to be 10pm on 10/2.  He arrived at  [facility] as code stroke and taken for emergent CT/CTA which demonstrated left M1 occlusion. He received IV tenecteplase and IR thrombectomy due to large vessel occlusion.       DIAGNOSTIC FINDINGS:  MRI 05/22/2022 Cortical ischemia throughout the left MCA territory compatible with recent infarct. Relatively little affect on the white matter. 2. Small focus of acute hemorrhage along the anterior surface of the left temporal lobe Heidelberg classification 3c: Subarachnoid hemorrhage. 3. Normal intracranial MRA.  Restored flow in left MCA.    THERAPY DIAG:  Aphasia  Apraxia  Middle cerebral artery embolism, left  Rationale for Evaluation and  Treatment Rehabilitation  SUBJECTIVE: pt appeared in a pleasant mood, wife reports they did a lot of homework  Pt accompanied by: self and significant other  PAIN:  Are you having pain? No  PATIENT GOALS: to be able to communicate again  OBJECTIVE:   TODAY'S TREATMENT: Skilled treatment session focused on pt's communication goals. SLP facilitated session by providing the following interventions:  SLP requested pt and his wife demonstrate calendar activity as they perform at home. Suggest pt and wife say words together to promote accurate word finding and speech intelligibility (motor movement).   Pt used phone to locate name of golf tournament that they watched. Pt's wife voiced name prior to pt receiving cue to write down his response. As a result, suspect that pt didn't understand the question or have good reading comprehension as pt didn't know what to write down.   SLP also had wife and pt demonstrate work with [place]. SLP provided written information regarding cognitive burden and level of language cues required for pt to complete confrontational naming task. Marcene Brawn his wife voiced understanding, she will likely benefit from additional education in ways to facilitate accurate word finding and motor patterns.    PATIENT EDUCATION: Education details: see above Person educated: Patient and Wife Education method: Explanation, Media planner, Verbal cues, and Handouts Education comprehension: verbalized understanding  HOME EXERCISE PROGRAM:  Complete calendar  GOALS: Goals reviewed with patient? Yes  SHORT TERM GOALS: Target date: 10 sessions  UPDATED - 06/24/2022  UPDATED - 07/19/2022  UPDATED - 08/20/2022  UPDATED - 09/13/2022 Given moderate assistance, pt will answer simple basic yes/no questions with 50% accuracy.  Baseline: 25% Goal status: MET UPDATED goal: Given minimal assistance, pt will answer simple basic yes/no questions with 75% accuracy.  Goal status: MET   2.   Given moderate assistance, pt will follow simple basic 1 step directions with 50% accuracy.  Baseline: 0% Goal status: MET  UPDATED goal: Given minimal assistance, pt will following simple basic 1 step directions with 75% accuracy.   Goal status: MET   3.  With maximal multimodal cues, pt will name set of 10 common objects in 8 out of 10 opportunities.  Baseline: unable Goal status: MET UPDATED goal: With moderate cues, pt will name set of 10 common objects in 8 out of 10 opportunities.  Goal status: MET UPDATED goal: With minimal cues, pt will name set of 10 common objects in 8 out of 10 opportunities.  Goal status: ongoing    4. Pt will improve reading abilities by placing phrase level object function with object in field of 3 with 50% ability and moderate cues.              Baseline: object name to object - 90%             Goal status: MET  UPDATED goal: Pt will demonstrate  reading comprehension at the sentence level for basic information with > 75% accuracy and min A cues.   Goal status: ONGOING  Goal status: ONGOING  UPDATED GOAL: 09/13/2022  Pt will demonstrate reading comprehension at the sentence level for ADLs/iADLS with >70% accuracy given min A cues.    5. Pt will improve spelling abilities by completing common words by filling in missing letter with 50% ability and moderate cues.              Baseline: unable             Goal status: MET UPDATED goal: Pt will improve spelling abilities by completing common words by filling in missing letters with >90% accuracy and rare min A cues.  Goal status: MET UPDATED goal: Pt will improve spelling ability by writing names of basic objects with supervision level cues in 8 out of 10 opportunities.  Goal status: ONGOING UPDATED GOAL: 09/13/2022 Pt will improve spelling ability by accurately writing sentence level description of his ADLs/iADLs with > 75% accuracy with Min A cues.    6. Pt will repeat rote phrases with 25% speech  intelligibility given max to moderate cues.              Baseline: unable             Goal status: MET  UPDATED goal: Pt will repeat rote phrases with ~ 75% speech intelligibility given minimal cues.   Goal status: ONGOING  Goal status: ONGOING  Goal status: MET  UPDATED GOAL: 09/13/2022  Pt will read sentences related to his ADLs/iADLs with > 75% speech intelligibility and moderate assistance.     LONG TERM GOALS: Target date: 10/25/2022   Pt will use multimodal communication to express basic wants/needs in 4 of 10 opportunities.  Baseline: unable Goal status: ONGOING   2.  Pt will demonstrate increased reading comprehension by selecting accurate answer from 3 choices in 2 out of 10 opportunities.  Baseline: unable Goal status: ONGOING   3.  Pt will correctly spell his name in 5 out of 10 opportunities.  Baseline: unable Goal status: MET   ASSESSMENT:  CLINICAL IMPRESSION: Pt continues to exhibit moderate expressive communication impairment but is observed using longer sentences during conversation and activities. He continues to require max to moderate multi-modal assistance for speech intelligibility d/t receptive language deficits and deficits in emergent awareness.   OBJECTIVE IMPAIRMENTS include expressive language, receptive language, aphasia, and apraxia. These impairments are limiting patient from managing medications, managing appointments, managing finances, household responsibilities, ADLs/IADLs, and effectively communicating at home and in community. Factors affecting potential to achieve goals and functional outcome are severity of impairments. Patient will benefit from skilled SLP services to address above impairments and improve overall function.  REHAB POTENTIAL: Excellent  PLAN: SLP FREQUENCY: 3x/week  SLP DURATION: 12 weeks  PLANNED INTERVENTIONS: Language facilitation, Environmental controls, Internal/external aids, Functional tasks, Multimodal  communication approach, SLP instruction and feedback, Compensatory strategies, and Patient/family education   Amreen Raczkowski B. Rutherford Nail, M.S., CCC-SLP, Mining engineer Certified Brain Injury [place]  [place] Office [phone] Ascom [phone] Fax [phone]

## 2022-09-24 LAB — CUP PACEART REMOTE DEVICE CHECK
Date Time Interrogation Session: 20240205100016
Implantable Pulse Generator Implant Date: 20231005
Pulse Gen Model: 450218
Pulse Gen Serial Number: 95054077

## 2022-09-25 ENCOUNTER — Ambulatory Visit: Payer: Medicare HMO | Admitting: Speech Pathology

## 2022-09-27 ENCOUNTER — Ambulatory Visit: Payer: Medicare HMO | Admitting: Speech Pathology

## 2022-09-27 DIAGNOSIS — R482 Apraxia: Secondary | ICD-10-CM | POA: Diagnosis not present

## 2022-09-27 DIAGNOSIS — I6602 Occlusion and stenosis of left middle cerebral artery: Secondary | ICD-10-CM

## 2022-09-27 DIAGNOSIS — R4701 Aphasia: Secondary | ICD-10-CM

## 2022-09-29 NOTE — Therapy (Unsigned)
OUTPATIENT SPEECH LANGUAGE PATHOLOGY TREATMENT NOTE     Patient Name: Alan Stewart MRN: [id number] DOB:Nov 21, 1945, 77 y.o., male Today's Date: 09/27/2022  PCP: Alan Lab, MD REFERRING PROVIDER: Ramonita Lab, MD  END OF SESSION   End of Session - 09/27/22 2057     Visit Number 45    Number of Visits 55    Date for SLP Re-Evaluation 10/25/22    Authorization Type [employer] HMO    Authorization Time Period 08/26/2022 thru 11/22/2022    Authorization - Visit Number [id number]    Authorization - Number of Visits 36    Progress Note Due on Visit 44    SLP Start Time 1000    SLP Stop Time  1100    SLP Time Calculation (min) 60 min    Activity Tolerance Patient tolerated treatment well                    Past Medical History:  Diagnosis Date   CAD (coronary artery disease)    a. 10/2021 NSTEMI/PCI: LM nl, LAD min irregs, LCX 82m(2.5x18 Onyx Frontier DES), OM1 nl, RCA 967m3.5x26 Onyx Frontier DES), 30d.   Diastolic dysfunction    a. 10/2021 Echo: EF 50-55%, no rwma, Gr1 DD, nl RV fxn. No significant valvular dzs.   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 SCCis. MOHs 07/08/22   Past Surgical History:  Procedure Laterality Date   COLONOSCOPY N/A 10/09/2015   Procedure: COLONOSCOPY;  Surgeon: PaHulen LusterMD;  [facility];  Service: Gastroenterology;  Laterality: N/A;   CORONARY STENT INTERVENTION N/A 10/22/2021   Procedure: CORONARY STENT INTERVENTION;  Surgeon: ArWellington HampshireMD;  [facility];  Service: Cardiovascular;  Laterality: N/A;   IR CT HEAD LTD  05/21/2022   IR PERCUTANEOUS ART THROMBECTOMY/INFUSION INTRACRANIAL INC DIAG ANGIO  05/21/2022   LEFT HEART CATH AND CORONARY ANGIOGRAPHY N/A 10/22/2021   Procedure: LEFT HEART  CATH AND CORONARY ANGIOGRAPHY;  Surgeon: ArWellington HampshireMD;  [facility];  Service: Cardiovascular;  Laterality: N/A;   LOOP RECORDER INSERTION N/A 05/23/2022   Procedure: LOOP RECORDER INSERTION;  Surgeon: Alan EpleyMD;  [facility];  Service: Cardiovascular;  Laterality: N/A;   RADIOLOGY WITH ANESTHESIA N/A 05/20/2022   Procedure: IR WITH ANESTHESIA;  Surgeon: DeLuanne BrasMD;  [facility] Service: Radiology;  Laterality: N/A;   ROTATOR CUFF REPAIR     Patient Active Problem List   Diagnosis Date Noted   Stroke (cerebrum) (HCCoffe[place]10/10/2021   Middle cerebral artery embolism, 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 male with PMH: HTN, CAD, HLD, osteoarthritis, SCC, sleep apnea. He presented to the hospital on 05/20/22 with right sided weakness and aphasia with LKW reported by wife to be 10pm on  10/2.  He [facility] at [facility] as code stroke and taken for emergent CT/CTA which demonstrated left M1 occlusion. He received IV tenecteplase and IR thrombectomy due to large vessel occlusion.       DIAGNOSTIC FINDINGS:  MRI 05/22/2022 Cortical ischemia throughout the left MCA territory compatible with recent infarct. Relatively little affect on the white matter. 2. Small focus of acute hemorrhage along the anterior surface of the left temporal lobe Heidelberg classification 3c: Subarachnoid hemorrhage. 3. Normal intracranial MRA.  Restored flow in left MCA.    THERAPY DIAG:  Aphasia  Apraxia  Middle cerebral artery embolism, left  Rationale for  Evaluation and Treatment Rehabilitation  SUBJECTIVE: pt appeared in a pleasant mood, wife reports they did a lot of homework  Pt accompanied by: self and significant other  PAIN:  Are you having pain? No  PATIENT GOALS: to be able to communicate again  OBJECTIVE:   TODAY'S TREATMENT: Skilled treatment session focused on pt's communication goals. SLP facilitated session by providing the following interventions:  SLP requested pt and his wife demonstrate calendar activity as they perform at home. Suggest pt and wife say words together to promote accurate word finding and speech intelligibility (motor movement).   Pt used phone to locate name of golf tournament that they watched. Pt's wife voiced name prior to pt receiving cue to write down his response. As a result, suspect that pt didn't understand the question or have good reading comprehension as pt didn't know what to write down.   SLP also had wife and pt demonstrate work with [place]. SLP provided written information regarding cognitive burden and level of language cues required for pt to complete confrontational naming task. Alan Stewart his wife voiced understanding, she will likely benefit from additional education in ways to facilitate accurate word finding and motor patterns.    PATIENT EDUCATION: Education details: see above Person educated: Patient and Wife Education method: Explanation, Media planner, Verbal cues, and Handouts Education comprehension: verbalized understanding  HOME EXERCISE PROGRAM:  Complete calendar  GOALS: Goals reviewed with patient? Yes  SHORT TERM GOALS: Target date: 10 sessions  UPDATED - 06/24/2022  UPDATED - 07/19/2022  UPDATED - 08/20/2022  UPDATED - 09/13/2022 Given moderate assistance, pt will answer simple basic yes/no questions with 50% accuracy.  Baseline: 25% Goal status: MET UPDATED goal: Given minimal assistance, pt will answer simple basic yes/no questions with 75% accuracy.  Goal  status: MET   2.  Given moderate assistance, pt will follow simple basic 1 step directions with 50% accuracy.  Baseline: 0% Goal status: MET  UPDATED goal: Given minimal assistance, pt will following simple basic 1 step directions with 75% accuracy.   Goal status: MET   3.  With maximal multimodal cues, pt will name set of 10 common objects in 8 out of 10 opportunities.  Baseline: unable Goal status: MET UPDATED goal: With moderate cues, pt will name set of 10 common objects in 8 out of 10 opportunities.  Goal status: MET UPDATED goal: With minimal cues, pt will name set of 10 common objects in 8 out of 10 opportunities.  Goal status: ongoing    4. Pt will improve reading abilities by placing phrase level object function with object in field of 3 with 50% ability and moderate cues.              Baseline: object name to object - 90%             Goal status: MET  UPDATED goal: Pt will demonstrate reading comprehension at the sentence level for basic information with > 75% accuracy and min A cues.   Goal status: ONGOING  Goal status: ONGOING  UPDATED GOAL: 09/13/2022  Pt will demonstrate reading comprehension at the sentence level for ADLs/iADLS with [id number] accuracy given min A cues.    5. Pt will improve spelling abilities by completing common words by filling in missing letter with 50% ability and moderate cues.              Baseline: unable             Goal status: MET UPDATED goal: Pt will improve spelling abilities by completing common words by filling in missing letters with >90% accuracy and rare min A cues.  Goal status: MET UPDATED goal: Pt will improve spelling ability by writing names of basic objects with supervision level cues in 8 out of 10 opportunities.  Goal status: ONGOING UPDATED GOAL: 09/13/2022 Pt will improve spelling ability by accurately writing sentence level description of his ADLs/iADLs with > 75% accuracy with Min A cues.    6. Pt will repeat rote phrases  with 25% speech intelligibility given max to moderate cues.              Baseline: unable             Goal status: MET  UPDATED goal: Pt will repeat rote phrases with ~ 75% speech intelligibility given minimal cues.   Goal status: ONGOING  Goal status: ONGOING  Goal status: MET  UPDATED GOAL: 09/13/2022  Pt will read sentences related to his ADLs/iADLs with > 75% speech intelligibility and moderate assistance.     LONG TERM GOALS: Target date: 10/25/2022   Pt will use multimodal communication to express basic wants/needs in 4 of 10 opportunities.  Baseline: unable Goal status: ONGOING   2.  Pt will demonstrate increased reading comprehension by selecting accurate answer from 3 choices in 2 out of 10 opportunities.  Baseline: unable Goal status: ONGOING   3.  Pt will correctly spell his name in 5 out of 10 opportunities.  Baseline: unable Goal status: MET   ASSESSMENT:  CLINICAL IMPRESSION: Pt continues to exhibit moderate expressive communication impairment but is observed using longer sentences during conversation and activities. He continues to require max to moderate multi-modal assistance for speech intelligibility d/t receptive language deficits and deficits in emergent awareness.   OBJECTIVE IMPAIRMENTS include expressive language, receptive language, aphasia, and apraxia. These impairments are limiting patient from managing medications, managing appointments, managing finances, household responsibilities, ADLs/IADLs, and effectively communicating at home and in community. Factors affecting potential to achieve goals and functional outcome are severity of impairments. Patient will benefit from skilled SLP services to address above impairments and improve overall function.  REHAB POTENTIAL: Excellent  PLAN: SLP FREQUENCY: 3x/week  SLP DURATION: 12 weeks  PLANNED INTERVENTIONS: Language facilitation, Environmental controls, Internal/external aids, Functional tasks,  Multimodal communication approach, SLP instruction and feedback, Compensatory strategies, and Patient/family education   [place] Goeken B. Rutherford Nail, M.S., CCC-SLP, Mining engineer Certified Brain Injury [place]  [place] Office [phone] Ascom [phone] Fax [phone]

## 2022-09-30 ENCOUNTER — Ambulatory Visit: Payer: Medicare HMO | Admitting: Speech Pathology

## 2022-09-30 DIAGNOSIS — R4701 Aphasia: Secondary | ICD-10-CM

## 2022-09-30 DIAGNOSIS — R482 Apraxia: Secondary | ICD-10-CM

## 2022-09-30 DIAGNOSIS — I6602 Occlusion and stenosis of left middle cerebral artery: Secondary | ICD-10-CM | POA: Diagnosis not present

## 2022-10-01 NOTE — Therapy (Signed)
OUTPATIENT SPEECH LANGUAGE PATHOLOGY TREATMENT NOTE     Patient Name: Alan Stewart MRN: [id number] DOB:[birth year], 77 y.o., male Today's Date: 09/30/2022  PCP: Ramonita Lab, MD REFERRING PROVIDER: Ramonita Lab, MD  END OF SESSION  End of Session - 10/01/22 1307     Visit Number 24    Number of Visits 47    Date for SLP Re-Evaluation 10/25/22    Authorization Type [employer] H[employer]    Authorization Time Period 08/26/2022 thru 11/22/2022    Authorization - Visit Number 37    Authorization - Number of Visits 36    Progress Note Due on Visit 8    SLP Start Time 0900    SLP Stop Time  1000    SLP Time Calculation (min) 60 min    Activity Tolerance Patient tolerated treatment well               Past Medical History:  Diagnosis Date   CAD (coronary artery disease)    a. 10/2021 NSTEMI/PCI: LM nl, LAD min irregs, LCX 52m(2.5x18 Onyx Frontier DES), OM1 nl, RCA 975m3.5x26 Onyx Frontier DES), 30d.   Diastolic dysfunction    a. 10/2021 Echo: EF 50-55%, no rwma, Gr1 DD, nl RV fxn. No significant valvular dzs.   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 SCCis. MOHs 07/08/22   Past Surgical History:  Procedure Laterality Date   COLONOSCOPY N/A 10/09/2015   Procedure: COLONOSCOPY;  Surgeon: PaHulen LusterMD;  [facility];  Service: Gastroenterology;  Laterality: N/A;   CORONARY STENT INTERVENTION N/A 10/22/2021   Procedure: CORONARY STENT INTERVENTION;  Surgeon: ArWellington HampshireMD;  [place] [facility];  Service: Cardiovascular;  Laterality: N/A;   IR CT HEAD LTD  05/21/2022   IR PERCUTANEOUS ART THROMBECTOMY/INFUSION INTRACRANIAL INC DIAG ANGIO  05/21/2022   LEFT HEART CATH AND CORONARY ANGIOGRAPHY N/A 10/22/2021   Procedure: LEFT HEART CATH AND  CORONARY ANGIOGRAPHY;  Surgeon: ArWellington HampshireMD;  [place] [place];  Service: Cardiovascular;  Laterality: N/A;   LOOP RECORDER INSERTION N/A 05/23/2022   Procedure: LOOP RECORDER INSERTION;  Surgeon: LaVickie EpleyMD;  [place] [facility];  Service: Cardiovascular;  Laterality: N/A;   RADIOLOGY WITH ANESTHESIA N/A 05/20/2022   Procedure: IR WITH ANESTHESIA;  Surgeon: DeLuanne BrasMD;  [facility] [place] Service: Radiology;  Laterality: N/A;   ROTATOR CUFF REPAIR     Patient Active Problem List   Diagnosis Date Noted   Stroke (cerebrum) (HCGoehner10/10/2021   Middle cerebral artery embolism, 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 male with PMH: HTN, CAD, HLD, osteoarthritis, SCC, sleep apnea. He presented to the hospital on 05/20/22 with right sided weakness and aphasia with LKW reported by wife to be 10pm on 10/2.  [facility] and [facility] for emergent CT/CTA which demonstrated left M1 occlusion. He received IV tenecteplase and IR thrombectomy due to large vessel occlusion.       DIAGNOSTIC FINDINGS:  MRI 05/22/2022 Cortical ischemia throughout the left MCA territory compatible with recent infarct. Relatively little affect on the white matter. 2. Small focus of acute hemorrhage along the anterior surface of the left temporal lobe Heidelberg classification 3c: Subarachnoid hemorrhage. 3. Normal intracranial MRA.  Restored flow in left MCA.    THERAPY DIAG:  Aphasia  Apraxia  Middle cerebral artery embolism, left  Rationale for Evaluation and  Treatment Rehabilitation  SUBJECTIVE: pt pleasant, smiling about the superbowl from previous evening  Pt accompanied by: self and significant other  PAIN:  Are you having pain? No  PATIENT GOALS: to be able to communicate again  OBJECTIVE:   TODAY'S TREATMENT: Skilled treatment session focused on pt's communication goals. SLP facilitated session by providing the following interventions:  When reviewing pt's calendar, it was missing some salient activities with his wife adding additional sentences. Suspect that pt isn't selecting all the appropriate activities because of potential deficits in reading comprehension  These phrases were used to assess and improve pt's reading comprehension.  Pt able to select appropriate choice 15 out 23 opportunities  To further target word finding, the [facility] 1:  Aphasia Rehab - Unit 3 - Vocabulary was utilized.   Name a word that mastches the definition - reading comprehenion - of definition and selecting correct word in field of 3 words p79  12/15; improving to 15/15 with reading of definition emphazing key words p80 10/15 p81 13/15  Choose the word that fits the clues p 85 12/15 p 86 11/15 improving to 100% with extra time p 87 13/15 improving to 100% with verbal cues  Choose the items that belong in each category p102 - max faded to moderate cues to achieve 90%  PATIENT EDUCATION: Education details: see above Person educated: Patient and Wife Education method: Consulting civil engineer, Demonstration, Verbal cues, and Handouts Education comprehension: verbalized understanding  HOME EXERCISE PROGRAM:  Complete calendar  GOALS: Goals reviewed with patient? Yes  SHORT TERM GOALS: Target date: 10 sessions  UPDATED - 06/24/2022  UPDATED - 07/19/2022  UPDATED - 08/20/2022  UPDATED - 09/13/2022 Given moderate assistance, pt will answer simple basic yes/no questions with 50% accuracy.  Baseline: 25% Goal status: MET UPDATED goal: Given minimal  assistance, pt will answer simple basic yes/no questions with 75% accuracy.  Goal status: MET   2.  Given moderate assistance, pt will follow simple basic 1 step directions with 50% accuracy.  Baseline: 0% Goal status: MET  UPDATED goal: Given minimal assistance, pt will following simple basic 1 step directions with 75% accuracy.   Goal status: MET   3.  With maximal multimodal cues, pt will name set of 10 common objects in 8 out of 10 opportunities.  Baseline: unable Goal status: MET UPDATED goal: With moderate cues, pt will name set of 10 common objects in 8 out of 10 opportunities.  Goal status: MET UPDATED goal: With minimal cues, pt will name set of 10 common objects in 8 out of 10 opportunities.  Goal status: ongoing    4. Pt will improve reading abilities by placing phrase level object function with object in field of 3 with 50% ability and moderate cues.              Baseline: object name to object - 90%  Goal status: MET  UPDATED goal: Pt will demonstrate reading comprehension at the sentence level for basic information with > 75% accuracy and min A cues.   Goal status: ONGOING  Goal status: ONGOING  UPDATED GOAL: 09/13/2022  Pt will demonstrate reading comprehension at the sentence level for ADLs/iADLS with [id number] accuracy given min A cues.    5. Pt will improve spelling abilities by completing common words by filling in missing letter with 50% ability and moderate cues.              Baseline: unable             Goal status: MET UPDATED goal: Pt will improve spelling abilities by completing common words by filling in missing letters with >90% accuracy and rare min A cues.  Goal status: MET UPDATED goal: Pt will improve spelling ability by writing names of basic objects with supervision level cues in 8 out of 10 opportunities.  Goal status: ONGOING UPDATED GOAL: 09/13/2022 Pt will improve spelling ability by accurately writing sentence level description of his  ADLs/iADLs with > 75% accuracy with Min A cues.    6. Pt will repeat rote phrases with 25% speech intelligibility given max to moderate cues.              Baseline: unable             Goal status: MET  UPDATED goal: Pt will repeat rote phrases with ~ 75% speech intelligibility given minimal cues.   Goal status: ONGOING  Goal status: ONGOING  Goal status: MET  UPDATED GOAL: 09/13/2022  Pt will read sentences related to his ADLs/iADLs with > 75% speech intelligibility and moderate assistance.     LONG TERM GOALS: Target date: 10/25/2022   Pt will use multimodal communication to express basic wants/needs in 4 of 10 opportunities.  Baseline: unable Goal status: ONGOING   2.  Pt will demonstrate increased reading comprehension by selecting accurate answer from 3 choices in 2 out of 10 opportunities.  Baseline: unable Goal status: ONGOING   3.  Pt will correctly spell his name in 5 out of 10 opportunities.  Baseline: unable Goal status: MET   ASSESSMENT:  CLINICAL IMPRESSION: Pt continues to exhibit moderate expressive communication impairment but is observed using longer sentences during conversation and activities. He continues to require max to moderate multi-modal assistance for speech intelligibility d/t receptive language deficits and deficits in emergent awareness.   OBJECTIVE IMPAIRMENTS include expressive language, receptive language, aphasia, and apraxia. These impairments are limiting patient from managing medications, managing appointments, managing finances, household responsibilities, ADLs/IADLs, and effectively communicating at home and in community. Factors affecting potential to achieve goals and functional outcome are severity of impairments. Patient will benefit from skilled SLP services to address above impairments and improve overall function.  REHAB POTENTIAL: Excellent  PLAN: SLP FREQUENCY: 3x/week  SLP DURATION: 12 weeks  PLANNED INTERVENTIONS: Language  facilitation, Environmental controls, Internal/external aids, Functional tasks, Multimodal communication approach, SLP instruction and feedback, Compensatory strategies, and Patient/family education   Shanika Levings B. Rutherford Nail, M.S., CCC-SLP, Mining engineer Certified Brain Injury [place]  [place] Office [phone] Ascom [phone] Fax [phone]

## 2022-10-02 ENCOUNTER — Ambulatory Visit: Payer: Medicare HMO | Admitting: Speech Pathology

## 2022-10-02 DIAGNOSIS — I6602 Occlusion and stenosis of left middle cerebral artery: Secondary | ICD-10-CM | POA: Diagnosis not present

## 2022-10-02 DIAGNOSIS — R482 Apraxia: Secondary | ICD-10-CM

## 2022-10-02 DIAGNOSIS — R4701 Aphasia: Secondary | ICD-10-CM | POA: Diagnosis not present

## 2022-10-02 NOTE — Therapy (Signed)
OUTPATIENT SPEECH LANGUAGE PATHOLOGY TREATMENT NOTE     Patient Name: Alan Stewart MRN: [id number] DOB:1946-04-01, 77 y.o., male Today's Date: 09/30/2022  PCP: Ramonita Lab, MD REFERRING PROVIDER: Ramonita Lab, MD  END OF SESSION  End of Session - 10/01/22 1307     Visit Number 1    Number of Visits 96    Date for SLP Re-Evaluation 10/25/22    Authorization Type [employer] H[employer]    Authorization Time Period 08/26/2022 thru 11/22/2022    Authorization - Visit Number 70    Authorization - Number of Visits 36    Progress Note Due on Visit 61    SLP Start Time 0900    SLP Stop Time  1000    SLP Time Calculation (min) 60 min    Activity Tolerance Patient tolerated treatment well               Past Medical History:  Diagnosis Date   CAD (coronary artery disease)    a. 10/2021 NSTEMI/PCI: LM nl, LAD min irregs, LCX 20m(2.5x18 Onyx Frontier DES), OM1 nl, RCA 914m3.5x26 Onyx Frontier DES), 30d.   Diastolic dysfunction    a. 10/2021 Echo: EF 50-55%, no rwma, Gr1 DD, nl RV fxn. No significant valvular dzs.   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 SCCis. MOHs 07/08/22   Past Surgical History:  Procedure Laterality Date   COLONOSCOPY N/A 10/09/2015   Procedure: COLONOSCOPY;  Surgeon: PaHulen LusterMD;  [facility];  Service: Gastroenterology;  Laterality: N/A;   CORONARY STENT INTERVENTION N/A 10/22/2021   Procedure: CORONARY STENT INTERVENTION;  Surgeon: ArWellington HampshireMD;  [place] [facility];  Service: Cardiovascular;  Laterality: N/A;   IR CT HEAD LTD  05/21/2022   IR PERCUTANEOUS ART THROMBECTOMY/INFUSION INTRACRANIAL INC DIAG ANGIO  05/21/2022   LEFT HEART CATH AND CORONARY ANGIOGRAPHY N/A 10/22/2021   Procedure: LEFT HEART CATH AND  CORONARY ANGIOGRAPHY;  Surgeon: ArWellington HampshireMD;  [place];  Service: Cardiovascular;  Laterality: N/A;   LOOP RECORDER INSERTION N/A 05/23/2022   Procedure: LOOP RECORDER INSERTION;  Surgeon: LaVickie EpleyMD;  Location: [place];  Service: Cardiovascular;  Laterality: N/A;   RADIOLOGY WITH ANESTHESIA N/A 05/20/2022   Procedure: IR WITH ANESTHESIA;  Surgeon: DeLuanne BrasMD;  [place] Service: Radiology;  Laterality: N/A;   ROTATOR CUFF REPAIR     Patient Active Problem List   Diagnosis Date Noted   Stroke (cerebrum) (HCClaremont10/10/2021   Middle cerebral artery embolism, 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 male with PMH: HTN, CAD, HLD, osteoarthritis, SCC, sleep apnea. He presented to the hospital on 05/20/22 with right sided weakness and aphasia with LKW reported by wife to be 10pm on 10/2.  [facility] for emergent CT/CTA which demonstrated left M1 occlusion. He received IV tenecteplase and IR thrombectomy due to large vessel occlusion.       DIAGNOSTIC FINDINGS:  MRI 05/22/2022 Cortical ischemia throughout the left MCA territory compatible with recent infarct. Relatively little affect on the white matter. 2. Small focus of acute hemorrhage along the anterior surface of the left temporal lobe Heidelberg classification 3c: Subarachnoid hemorrhage. 3. Normal intracranial MRA.  Restored flow in left MCA.    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 No  PATIENT GOALS: to be able to communicate again  OBJECTIVE:   TODAY'S TREATMENT: Skilled treatment session focused on pt's communication goals. SLP facilitated session by providing the following interventions:  Written phrases from pt's calendar were used to assess and improve pt's reading comprehension.  Pt able to select appropriate choice in 14 out 18 opportunities (77% - improved from 65% last session)  Speech intelligibility: After selecting accurate choice, SLP read sentence and then prompted pt to read sentence by himself - he read 15/20 intelligibly improving to 20 out of 20 with SLP simultaneous SLP model    PATIENT EDUCATION: Education details: see above Person educated: Patient and Wife Education method: Explanation, Demonstration, Verbal cues, and Handouts Education comprehension: verbalized understanding  HOME EXERCISE PROGRAM:  Complete calendar  GOALS: Goals reviewed with patient? Yes  SHORT TERM GOALS: Target date: 10 sessions  UPDATED - 06/24/2022  UPDATED - 07/19/2022  UPDATED - 08/20/2022  UPDATED - 09/13/2022 Given moderate assistance, pt will answer simple basic yes/no questions with 50% accuracy.  Baseline: 25% Goal status: MET UPDATED goal: Given minimal assistance, pt will answer simple basic yes/no questions with 75% accuracy.  Goal status: MET   2.  Given moderate assistance, pt will follow simple basic 1 step directions with 50% accuracy.  Baseline: 0% Goal status: MET  UPDATED goal: Given minimal assistance, pt will following simple basic 1 step directions with 75% accuracy.   Goal status: MET   3.  With maximal multimodal cues, pt will name set of 10 common objects in 8 out of 10 opportunities.  Baseline: unable Goal status:  MET UPDATED goal: With moderate cues, pt will name set of 10 common objects in 8 out of 10 opportunities.  Goal status: MET UPDATED goal: With minimal cues, pt will name set of 10 common objects in 8 out of 10 opportunities.  Goal status: ongoing    4. Pt will improve reading abilities by placing phrase level object function with object in field of 3 with 50% ability and moderate cues.              Baseline: object name to object - 90%             Goal status: MET  UPDATED goal: Pt will demonstrate reading comprehension at the sentence level for basic information with > 75% accuracy and min A cues.   Goal status: ONGOING  Goal status: ONGOING  UPDATED GOAL: 09/13/2022  Pt will demonstrate reading comprehension at the sentence level for ADLs/iADLS with [id number] accuracy given min A cues.    5. Pt will improve spelling abilities by completing common words by filling in missing letter with  50% ability and moderate cues.              Baseline: unable             Goal status: MET UPDATED goal: Pt will improve spelling abilities by completing common words by filling in missing letters with >90% accuracy and rare min A cues.  Goal status: MET UPDATED goal: Pt will improve spelling ability by writing names of basic objects with supervision level cues in 8 out of 10 opportunities.  Goal status: ONGOING UPDATED GOAL: 09/13/2022 Pt will improve spelling ability by accurately writing sentence level description of his ADLs/iADLs with > 75% accuracy with Min A cues.    6. Pt will repeat rote phrases with 25% speech intelligibility given max to moderate cues.              Baseline: unable             Goal status: MET  UPDATED goal: Pt will repeat rote phrases with ~ 75% speech intelligibility given minimal cues.   Goal status: ONGOING  Goal status: ONGOING  Goal status: MET  UPDATED GOAL: 09/13/2022  Pt will read sentences related to his ADLs/iADLs with > 75% speech intelligibility and moderate  assistance.     LONG TERM GOALS: Target date: 10/25/2022   Pt will use multimodal communication to express basic wants/needs in 4 of 10 opportunities.  Baseline: unable Goal status: ONGOING   2.  Pt will demonstrate increased reading comprehension by selecting accurate answer from 3 choices in 2 out of 10 opportunities.  Baseline: unable Goal status: ONGOING   3.  Pt will correctly spell his name in 5 out of 10 opportunities.  Baseline: unable Goal status: MET   ASSESSMENT:  CLINICAL IMPRESSION: Pt continues to exhibit moderate expressive communication impairment that is worsened by moderate apraxia of speech.   OBJECTIVE IMPAIRMENTS include expressive language, receptive language, aphasia, and apraxia. These impairments are limiting patient from managing medications, managing appointments, managing finances, household responsibilities, ADLs/IADLs, and effectively communicating at home and in community. Factors affecting potential to achieve goals and functional outcome are severity of impairments. Patient will benefit from skilled SLP services to address above impairments and improve overall function.  REHAB POTENTIAL: Excellent  PLAN: SLP FREQUENCY: 3x/week  SLP DURATION: 12 weeks  PLANNED INTERVENTIONS: Language facilitation, Environmental controls, Internal/external aids, Functional tasks, Multimodal communication approach, SLP instruction and feedback, Compensatory strategies, and Patient/family education   Angalina Ante B. Rutherford Nail, M.S., CCC-SLP, Mining engineer Certified Brain Injury [place]  [place] Office [phone] Ascom [phone] Fax [phone]

## 2022-10-04 ENCOUNTER — Ambulatory Visit: Payer: Medicare HMO | Admitting: Speech Pathology

## 2022-10-04 DIAGNOSIS — R4701 Aphasia: Secondary | ICD-10-CM

## 2022-10-04 DIAGNOSIS — R482 Apraxia: Secondary | ICD-10-CM

## 2022-10-04 DIAGNOSIS — I6602 Occlusion and stenosis of left middle cerebral artery: Secondary | ICD-10-CM | POA: Diagnosis not present

## 2022-10-04 NOTE — Therapy (Signed)
OUTPATIENT SPEECH LANGUAGE PATHOLOGY TREATMENT NOTE     Patient Name: Alan Stewart MRN: [id number] DOB:February 18, 1946, 77 y.o., male Today's Date: 09/30/2022  PCP: Ramonita Lab, MD REFERRING PROVIDER: Ramonita Lab, MD  END OF SESSION  End of Session - 10/04/22 1101     Visit Number 46    Number of Visits 65    Date for SLP Re-Evaluation 10/25/22    Authorization Type [employer] H[employer]    Authorization Time Period 08/26/2022 thru 11/22/2022    Authorization - Visit Number 81    Authorization - Number of Visits 36    Progress Note Due on Visit 37    SLP Start Time 1000    SLP Stop Time  1100    SLP Time Calculation (min) 60 min    Activity Tolerance Patient tolerated treatment well               Past Medical History:  Diagnosis Date   CAD (coronary artery disease)    a. 10/2021 NSTEMI/PCI: LM nl, LAD min irregs, LCX 42m(2.5x18 Onyx Frontier DES), OM1 nl, RCA 961m3.5x26 Onyx Frontier DES), 30d.   Diastolic dysfunction    a. 10/2021 Echo: EF 50-55%, no rwma, Gr1 DD, nl RV fxn. No significant valvular dzs.   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 SCCis. MOHs 07/08/22   Past Surgical History:  Procedure Laterality Date   COLONOSCOPY N/A 10/09/2015   Procedure: COLONOSCOPY;  Surgeon: PaHulen LusterMD;  [facility];  Service: Gastroenterology;  Laterality: N/A;   CORONARY STENT INTERVENTION N/A 10/22/2021   Procedure: CORONARY STENT INTERVENTION;  Surgeon: ArWellington HampshireMD;  [facility];  Service: Cardiovascular;  Laterality: N/A;   IR CT HEAD LTD  05/21/2022   IR PERCUTANEOUS ART THROMBECTOMY/INFUSION INTRACRANIAL INC DIAG ANGIO  05/21/2022   LEFT HEART CATH AND CORONARY ANGIOGRAPHY N/A 10/22/2021   Procedure: LEFT HEART CATH AND  CORONARY ANGIOGRAPHY;  Surgeon: ArWellington HampshireMD;  [place] [facility];  Service: Cardiovascular;  Laterality: N/A;   LOOP RECORDER INSERTION N/A 05/23/2022   Procedure: LOOP RECORDER INSERTION;  Surgeon: LaVickie EpleyMD;  [place] [facility];  Service: Cardiovascular;  Laterality: N/A;   RADIOLOGY WITH ANESTHESIA N/A 05/20/2022   Procedure: IR WITH ANESTHESIA;  Surgeon: DeLuanne BrasMD;  [facility] Service: Radiology;  Laterality: N/A;   ROTATOR CUFF REPAIR     Patient Active Problem List   Diagnosis Date Noted   Stroke (cerebrum) ([facility]10/10/2021   Middle cerebral artery embolism, 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 male with PMH: HTN, CAD, HLD, osteoarthritis, SCC, sleep apnea. He presented to the hospital on 05/20/22 with right sided weakness and aphasia with LKW reported by wife to be 10pm on 10/2.  [facility] and [facility] for emergent CT/CTA which demonstrated left M1 occlusion. He received IV tenecteplase and IR thrombectomy due to large vessel occlusion.       DIAGNOSTIC FINDINGS:  MRI 05/22/2022 Cortical ischemia throughout the left MCA territory compatible with recent infarct. Relatively little affect on the white matter. 2. Small focus of acute hemorrhage along the anterior surface of the left temporal lobe Heidelberg classification 3c: Subarachnoid hemorrhage. 3. Normal intracranial MRA.  Restored flow in left MCA.    THERAPY DIAG:  Aphasia  Apraxia  Rationale for Evaluation and Treatment Rehabilitation  SUBJECTIVE:  pt in a pleasant mood, wife states pt seems to have less errors during at home practice of speech activities.  Pt accompanied by: self and significant other  PAIN:  Are you having pain? No  PATIENT GOALS: to be able to communicate again  OBJECTIVE:   TODAY'S TREATMENT: Skilled treatment session focused on pt's communication goals. SLP facilitated session by providing the following interventions:  Written phrases from pt's calendar were used to assess and improve pt's reading comprehension.  Pt able to select appropriate choice in 14 out 19 opportunities (74% - slight decrease from 77% last session)  Speech intelligibility: After selecting accurate choice, SLP read sentence and then prompted pt to read sentence by himself - he read 13/20 intelligibly improving to 20 out of 20 with SLP simultaneous SLP model  With max faded to moderate cues pt able to state numbers on cards with 75% accuracy.   PATIENT EDUCATION: Education details: see above Person educated: Patient and Wife Education method: Explanation, Media planner, Verbal cues, and Handouts Education comprehension: verbalized understanding  HOME EXERCISE PROGRAM:  Complete calendar  GOALS: Goals reviewed with patient? Yes  SHORT TERM GOALS: Target date: 10 sessions  UPDATED - 06/24/2022  UPDATED - 07/19/2022  UPDATED - 08/20/2022  UPDATED - 09/13/2022 Given moderate assistance, pt will answer simple basic yes/no questions with 50% accuracy.  Baseline: 25% Goal status: MET UPDATED goal: Given minimal assistance, pt will answer simple basic yes/no questions with 75% accuracy.  Goal status: MET   2.  Given moderate assistance, pt will follow simple basic 1 step directions with 50% accuracy.  Baseline: 0% Goal status: MET  UPDATED goal: Given minimal assistance, pt will following simple basic 1 step directions with 75% accuracy.   Goal status: MET   3.  With maximal multimodal cues, pt will name set of 10 common  objects in 8 out of 10 opportunities.  Baseline: unable Goal status: MET UPDATED goal: With moderate cues, pt will name set of 10 common objects in 8 out of 10 opportunities.  Goal status: MET UPDATED goal: With minimal cues, pt will name set of 10 common objects in 8 out of 10 opportunities.  Goal status: ongoing    4. Pt will improve reading abilities by placing phrase level object function with object in field of 3 with 50% ability and moderate cues.              Baseline: object name to object - 90%             Goal status: MET  UPDATED goal: Pt will demonstrate reading comprehension at the sentence level for basic information with > 75% accuracy and min A cues.   Goal status: ONGOING  Goal status: ONGOING  UPDATED GOAL: 09/13/2022  Pt will demonstrate reading comprehension at the sentence level for ADLs/iADLS with [id number] accuracy given min A cues.  5. Pt will improve spelling abilities by completing common words by filling in missing letter with 50% ability and moderate cues.              Baseline: unable             Goal status: MET UPDATED goal: Pt will improve spelling abilities by completing common words by filling in missing letters with >90% accuracy and rare min A cues.  Goal status: MET UPDATED goal: Pt will improve spelling ability by writing names of basic objects with supervision level cues in 8 out of 10 opportunities.  Goal status: ONGOING UPDATED GOAL: 09/13/2022 Pt will improve spelling ability by accurately writing sentence level description of his ADLs/iADLs with > 75% accuracy with Min A cues.    6. Pt will repeat rote phrases with 25% speech intelligibility given max to moderate cues.              Baseline: unable             Goal status: MET  UPDATED goal: Pt will repeat rote phrases with ~ 75% speech intelligibility given minimal cues.   Goal status: ONGOING  Goal status: ONGOING  Goal status: MET  UPDATED GOAL: 09/13/2022  Pt will read sentences related  to his ADLs/iADLs with > 75% speech intelligibility and moderate assistance.     LONG TERM GOALS: Target date: 10/25/2022   Pt will use multimodal communication to express basic wants/needs in 4 of 10 opportunities.  Baseline: unable Goal status: ONGOING   2.  Pt will demonstrate increased reading comprehension by selecting accurate answer from 3 choices in 2 out of 10 opportunities.  Baseline: unable Goal status: ONGOING   3.  Pt will correctly spell his name in 5 out of 10 opportunities.  Baseline: unable Goal status: MET   ASSESSMENT:  CLINICAL IMPRESSION: Pt continues to exhibit moderate expressive communication impairment that is worsened by moderate apraxia of speech.   OBJECTIVE IMPAIRMENTS include expressive language, receptive language, aphasia, and apraxia. These impairments are limiting patient from managing medications, managing appointments, managing finances, household responsibilities, ADLs/IADLs, and effectively communicating at home and in community. Factors affecting potential to achieve goals and functional outcome are severity of impairments. Patient will benefit from skilled SLP services to address above impairments and improve overall function.  REHAB POTENTIAL: Excellent  PLAN: SLP FREQUENCY: 3x/week  SLP DURATION: 12 weeks  PLANNED INTERVENTIONS: Language facilitation, Environmental controls, Internal/external aids, Functional tasks, Multimodal communication approach, SLP instruction and feedback, Compensatory strategies, and Patient/family education  Alphonzo Grieve, SLP Graduate Clinician    Happi B. Rutherford Nail, M.S., CCC-SLP, Mining engineer Certified Brain Injury [place]  [place] Office [phone] Ascom [phone] Fax [phone]

## 2022-10-07 ENCOUNTER — Ambulatory Visit: Payer: Medicare HMO | Admitting: Speech Pathology

## 2022-10-07 DIAGNOSIS — R4701 Aphasia: Secondary | ICD-10-CM

## 2022-10-07 DIAGNOSIS — I6602 Occlusion and stenosis of left middle cerebral artery: Secondary | ICD-10-CM | POA: Diagnosis not present

## 2022-10-07 DIAGNOSIS — R482 Apraxia: Secondary | ICD-10-CM | POA: Diagnosis not present

## 2022-10-07 NOTE — Therapy (Signed)
OUTPATIENT SPEECH LANGUAGE PATHOLOGY TREATMENT NOTE     Patient Name: Alan Stewart MRN: [id number] DOB:19-Jan-1946, 77 y.o., male Today's Date: 10/07/2022   PCP: Ramonita Lab, MD REFERRING PROVIDER: Ramonita Lab, MD  END OF SESSION  End of Session - 10/07/22 1303     Visit Number 4    Number of Visits 13    Date for SLP Re-Evaluation 10/25/22    Authorization Type [employer] HMO    Authorization Time Period 08/26/2022 thru 11/22/2022    Authorization - Visit Number [id number]    Authorization - Number of Visits 36    Progress Note Due on Visit 35    SLP Start Time 0900    SLP Stop Time  1000    SLP Time Calculation (min) 60 min    Activity Tolerance Patient tolerated treatment well               Past Medical History:  Diagnosis Date   CAD (coronary artery disease)    a. 10/2021 NSTEMI/PCI: LM nl, LAD min irregs, LCX 48m(2.5x18 Onyx Frontier DES), OM1 nl, RCA 969m3.5x26 Onyx Frontier DES), 30d.   Diastolic dysfunction    a. 10/2021 Echo: EF 50-55%, no rwma, Gr1 DD, nl RV fxn. No significant valvular dzs.   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 SCCis. MOHs 07/08/22   Past Surgical History:  Procedure Laterality Date   COLONOSCOPY N/A 10/09/2015   Procedure: COLONOSCOPY;  Surgeon: PaHulen LusterMD;  [facility];  Service: Gastroenterology;  Laterality: N/A;   CORONARY STENT INTERVENTION N/A 10/22/2021   Procedure: CORONARY STENT INTERVENTION;  Surgeon: ArWellington HampshireMD;  [place] [facility];  Service: Cardiovascular;  Laterality: N/A;   IR CT HEAD LTD  05/21/2022   IR PERCUTANEOUS ART THROMBECTOMY/INFUSION INTRACRANIAL INC DIAG ANGIO  05/21/2022   LEFT HEART CATH AND CORONARY ANGIOGRAPHY N/A 10/22/2021   Procedure: LEFT HEART CATH AND  CORONARY ANGIOGRAPHY;  Surgeon: ArWellington HampshireMD;  [facility];  Service: Cardiovascular;  Laterality: N/A;   LOOP RECORDER INSERTION N/A 05/23/2022   Procedure: LOOP RECORDER INSERTION;  Surgeon: LaVickie EpleyMD;  [facility];  Service: Cardiovascular;  Laterality: N/A;   RADIOLOGY WITH ANESTHESIA N/A 05/20/2022   Procedure: IR WITH ANESTHESIA;  Surgeon: DeLuanne BrasMD;  [place] [facility] Service: Radiology;  Laterality: N/A;   ROTATOR CUFF REPAIR     Patient Active Problem List   Diagnosis Date Noted   Stroke (cerebrum) (HCAvon10/10/2021   Middle cerebral artery embolism, 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 male with PMH: HTN, CAD, HLD, osteoarthritis, SCC, sleep apnea. He presented to the hospital on 05/20/22 with right sided weakness and aphasia with LKW reported by wife to be 10pm on 10/2.  He arrived at  [facility] as code stroke and taken for emergent CT/CTA which demonstrated left M1 occlusion. He received IV tenecteplase and IR thrombectomy due to large vessel occlusion.       DIAGNOSTIC FINDINGS:  MRI 05/22/2022 Cortical ischemia throughout the left MCA territory compatible with recent infarct. Relatively little affect on the white matter. 2. Small focus of acute hemorrhage along the anterior surface of the left temporal lobe Heidelberg classification 3c: Subarachnoid hemorrhage. 3. Normal intracranial MRA.  Restored flow in left MCA.    THERAPY DIAG:  Aphasia  Apraxia  Middle cerebral artery embolism, left  Rationale for Evaluation and  Treatment Rehabilitation  SUBJECTIVE: pt in a pleasant mood, wife states pt seems to have less errors during at home practice of speech activities.  Pt accompanied by: self and significant other  PAIN:  Are you having pain? No  PATIENT GOALS: to be able to communicate again  OBJECTIVE:   TODAY'S TREATMENT: Skilled treatment session focused on pt's communication goals. SLP facilitated session by providing the following interventions:  Written phrases from pt's calendar were used to assess and improve pt's reading comprehension.  Pt able to select appropriate choice in 16 out 20 opportunities (80% - continued progress each session)  Vocabulary targeted thru antonyms Choosing opposite of specified word from field of 2 choices - 67% improving to 90% when word and choices were read to pt  With moderate cues pt able to state numbers on cards with 75% accuracy.   PATIENT EDUCATION: Education details: see above Person educated: Patient and Wife Education method: Explanation, Media planner, Verbal cues, and Handouts Education comprehension: verbalized understanding  HOME EXERCISE PROGRAM:  Complete calendar  GOALS: Goals reviewed with patient? Yes  SHORT TERM GOALS: Target date: 10 sessions  UPDATED - 06/24/2022  UPDATED - 07/19/2022  UPDATED - 08/20/2022  UPDATED - 09/13/2022 Given moderate assistance, pt will answer simple basic yes/no questions with 50% accuracy.  Baseline: 25% Goal status: MET UPDATED goal: Given minimal assistance, pt will answer simple basic yes/no questions with 75% accuracy.  Goal status: MET   2.  Given moderate assistance, pt will follow simple basic 1 step directions with 50% accuracy.  Baseline: 0% Goal status: MET  UPDATED goal: Given minimal assistance, pt will following simple basic 1 step directions with 75% accuracy.   Goal status: MET   3.  With maximal multimodal cues, pt will name set of 10 common objects in 8 out of 10 opportunities.   Baseline: unable Goal status: MET UPDATED goal: With moderate cues, pt will name set of 10 common objects in 8 out of 10 opportunities.  Goal status: MET UPDATED goal: With minimal cues, pt will name set of 10 common objects in 8 out of 10 opportunities.  Goal status: ongoing    4. Pt will improve reading abilities by placing phrase level object function with object in field of 3 with 50% ability and moderate cues.              Baseline: object name to object - 90%             Goal status: MET  UPDATED goal: Pt will demonstrate reading comprehension at the sentence level for basic information with > 75% accuracy and min A cues.   Goal status: ONGOING  Goal status: ONGOING  UPDATED GOAL: 09/13/2022  Pt will demonstrate reading comprehension at the sentence level for ADLs/iADLS with [id number] accuracy given min A cues.    5. Pt will improve spelling  abilities by completing common words by filling in missing letter with 50% ability and moderate cues.              Baseline: unable             Goal status: MET UPDATED goal: Pt will improve spelling abilities by completing common words by filling in missing letters with >90% accuracy and rare min A cues.  Goal status: MET UPDATED goal: Pt will improve spelling ability by writing names of basic objects with supervision level cues in 8 out of 10 opportunities.  Goal status: ONGOING UPDATED GOAL: 09/13/2022 Pt will improve spelling ability by accurately writing sentence level description of his ADLs/iADLs with > 75% accuracy with Min A cues.    6. Pt will repeat rote phrases with 25% speech intelligibility given max to moderate cues.              Baseline: unable             Goal status: MET  UPDATED goal: Pt will repeat rote phrases with ~ 75% speech intelligibility given minimal cues.   Goal status: ONGOING  Goal status: ONGOING  Goal status: MET  UPDATED GOAL: 09/13/2022  Pt will read sentences related to his ADLs/iADLs with > 75% speech  intelligibility and moderate assistance.     LONG TERM GOALS: Target date: 10/25/2022   Pt will use multimodal communication to express basic wants/needs in 4 of 10 opportunities.  Baseline: unable Goal status: ONGOING   2.  Pt will demonstrate increased reading comprehension by selecting accurate answer from 3 choices in 2 out of 10 opportunities.  Baseline: unable Goal status: ONGOING   3.  Pt will correctly spell his name in 5 out of 10 opportunities.  Baseline: unable Goal status: MET   ASSESSMENT:  CLINICAL IMPRESSION: Pt continues to present with improving moderate expressive communication impairment and moderate apraxia of speech.   OBJECTIVE IMPAIRMENTS include expressive language, receptive language, aphasia, and apraxia. These impairments are limiting patient from managing medications, managing appointments, managing finances, household responsibilities, ADLs/IADLs, and effectively communicating at home and in community. Factors affecting potential to achieve goals and functional outcome are severity of impairments. Patient will benefit from skilled SLP services to address above impairments and improve overall function.  REHAB POTENTIAL: Excellent  PLAN: SLP FREQUENCY: 3x/week  SLP DURATION: 12 weeks  PLANNED INTERVENTIONS: Language facilitation, Environmental controls, Internal/external aids, Functional tasks, Multimodal communication approach, SLP instruction and feedback, Compensatory strategies, and Patient/family education     Dagny Fiorentino B. Rutherford Nail, M.S., CCC-SLP, Mining engineer Certified Brain Injury [place]  [place] Office [phone] Ascom [phone] Fax [phone]

## 2022-10-09 ENCOUNTER — Ambulatory Visit: Payer: Medicare HMO | Admitting: Speech Pathology

## 2022-10-09 DIAGNOSIS — R482 Apraxia: Secondary | ICD-10-CM

## 2022-10-09 DIAGNOSIS — I6602 Occlusion and stenosis of left middle cerebral artery: Secondary | ICD-10-CM

## 2022-10-09 DIAGNOSIS — R4701 Aphasia: Secondary | ICD-10-CM | POA: Diagnosis not present

## 2022-10-09 NOTE — Therapy (Signed)
OUTPATIENT SPEECH LANGUAGE PATHOLOGY TREATMENT NOTE  10th VISIT PROGRESS NOTE     Patient Name: Alan Stewart MRN: [id number] DOB:[birth year], 77 y.o., male Today's Date: 10/09/2022   PCP: Ramonita Lab, MD REFERRING PROVIDER: Ramonita Lab, MD  Speech Therapy Progress Note  Dates of Reporting Period: 09/13/2022 to 10/09/2022  Objective: Patient has been seen for 10 speech therapy sessions this reporting period targeting aphasia and apraxia of speech. Patient is making great progress toward LTGs and  STGs this reporting period. See skilled intervention, clinical impressions, and goals below for details.   END OF SESSION  End of Session - 10/09/22 1004     Visit Number 50    Number of Visits 30    Date for SLP Re-Evaluation 10/25/22    Authorization Type [employer] HMO    Authorization Time Period 08/26/2022 thru 11/22/2022    Authorization - Visit Number [id number]    Authorization - Number of Visits 36    Progress Note Due on Visit 22    SLP Start Time 1000    SLP Stop Time  1100    SLP Time Calculation (min) 60 min    Activity Tolerance Patient tolerated treatment well               Past Medical History:  Diagnosis Date   CAD (coronary artery disease)    a. 10/2021 NSTEMI/PCI: LM nl, LAD min irregs, LCX 7m(2.5x18 Onyx Frontier DES), OM1 nl, RCA 922m3.5x26 Onyx Frontier DES), 30d.   Diastolic dysfunction    a. 10/2021 Echo: EF 50-55%, no rwma, Gr1 DD, nl RV fxn. No significant valvular dzs.   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 SCCis. MOHs 07/08/22   Past Surgical History:  Procedure Laterality Date   COLONOSCOPY N/A 10/09/2015   Procedure: COLONOSCOPY;  Surgeon: PaHulen LusterMD;  [facility];  Service: Gastroenterology;  Laterality:  N/A;   CORONARY STENT INTERVENTION N/A 10/22/2021   Procedure: CORONARY STENT INTERVENTION;  Surgeon: ArWellington HampshireMD;  [facility];  Service: Cardiovascular;  Laterality: N/A;   IR CT HEAD LTD  05/21/2022   IR PERCUTANEOUS ART THROMBECTOMY/INFUSION INTRACRANIAL INC DIAG ANGIO  05/21/2022   LEFT HEART CATH AND CORONARY ANGIOGRAPHY N/A 10/22/2021   Procedure: LEFT HEART CATH AND CORONARY ANGIOGRAPHY;  Surgeon: ArWellington HampshireMD;  [facility];  Service: Cardiovascular;  Laterality: N/A;   LOOP RECORDER INSERTION N/A 05/23/2022   Procedure: LOOP RECORDER INSERTION;  Surgeon: LaVickie EpleyMD;  [facility];  Service: Cardiovascular;  Laterality: N/A;   RADIOLOGY WITH ANESTHESIA N/A 05/20/2022   Procedure: IR WITH ANESTHESIA;  Surgeon: DeLuanne BrasMD;  [facility] Service: Radiology;  Laterality: N/A;   ROTATOR CUFF REPAIR     Patient Active Problem List   Diagnosis Date Noted   Stroke (cerebrum) (HCNord10/10/2021   Middle cerebral artery embolism, 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 male with PMH: HTN, CAD, HLD, osteoarthritis, SCC, sleep apnea. He presented to the hospital on 05/20/22 with right sided weakness and aphasia with LKW reported by wife to be 10pm on 10/2.  He arrived at [facility] as code stroke and taken for emergent CT/CTA which demonstrated left M1 occlusion. He received IV tenecteplase and IR thrombectomy due to large vessel occlusion.       DIAGNOSTIC FINDINGS:  MRI 05/22/2022 Cortical ischemia throughout the left  MCA territory compatible with recent infarct. Relatively little affect on the white matter. 2. Small focus of acute hemorrhage along the anterior surface of the left temporal lobe Heidelberg classification 3c: Subarachnoid hemorrhage. 3. Normal intracranial MRA.  Restored flow in left MCA.    THERAPY DIAG:  Aphasia  Apraxia  Middle cerebral artery embolism, left  Rationale for Evaluation and Treatment Rehabilitation  SUBJECTIVE: pt in a pleasant mood, wife states pt seems to have less errors during at home practice of speech activities.  Pt accompanied by: self and significant other  PAIN:  Are you having pain? No  PATIENT GOALS: to be able to communicate again  OBJECTIVE:   TODAY'S TREATMENT: Skilled treatment session focused on pt's communication goals. SLP facilitated session by providing the following interventions:  Pt's reading comprehension was targeted through use of functional phrases. When tasked to identify spoken phrases, pt achieved 73% accuracy when given moderate/max assistance. Pt's accuracy improved when instructed to write down spoken phrases prior to making a choice.   Pt completed [facility]: Aphasia Rehab; Unit 3 - Vocabulary pg 61 targeting oppisites with 92% acccuracy given minimal cues. Pt completed pg 63 with 55% accuracy given moderate cues, benefitting from examples of concepts. Pt required min/mod cues for spelling.   Pt required moderate cueing (count the symbols on the card) to state numbers on game cards with >75% accuracy.   PATIENT EDUCATION: Education details: see above Person educated: Patient and Wife Education method: Explanation, Media planner, Verbal cues, and Handouts Education comprehension: verbalized understanding  HOME EXERCISE PROGRAM:  Complete calendar  GOALS: Goals reviewed with patient? Yes  SHORT TERM GOALS: Target date: 10 sessions  UPDATED - 06/24/2022  UPDATED - 07/19/2022  UPDATED - 08/20/2022  UPDATED -  09/13/2022 UPDATED 10/09/2022 Given moderate assistance, pt will answer simple basic yes/no questions with 50% accuracy.  Baseline: 25% Goal status: MET UPDATED goal: Given minimal assistance, pt will answer simple basic yes/no questions with 75% accuracy.  Goal status: MET   2.  Given moderate assistance, pt will follow simple basic 1 step directions with 50% accuracy.  Baseline: 0% Goal status: MET  UPDATED goal: Given minimal assistance, pt will following simple basic 1 step directions with 75% accuracy.   Goal status: MET   3.  With maximal multimodal cues, pt will name set of 10 common objects in 8 out of 10 opportunities.  Baseline: unable Goal status: MET UPDATED goal: With moderate cues, pt will name set of 10 common objects in 8 out of 10 opportunities.  Goal status: MET UPDATED goal: With minimal cues, pt will name set of 10 common objects in 8 out of 10 opportunities.  Goal status: ongoing  4. Pt will improve reading abilities by placing phrase level object function with object in field of 3 with 50% ability and moderate cues.  Baseline: object name to object - 90%             Goal status: MET  UPDATED goal: Pt will demonstrate reading comprehension at the sentence level for basic information with > 75% accuracy and min A cues.   Goal status: ONGOING  Goal status: ONGOING  UPDATED GOAL: 09/13/2022  Pt will demonstrate reading comprehension at the sentence level for ADLs/iADLS with [id number] accuracy given min A cues.    5. Pt will improve spelling abilities by completing common words by filling in missing letter with 50% ability and moderate cues.              Baseline: unable             Goal status: MET UPDATED goal: Pt will improve spelling abilities by completing common words by filling in missing letters with >90% accuracy and rare min A cues.  Goal status: MET UPDATED goal: Pt will improve spelling ability by writing names of basic objects with supervision  level cues in 8 out of 10 opportunities.  Goal status: ONGOING UPDATED GOAL: 09/13/2022 Pt will improve spelling ability by accurately writing sentence level description of his ADLs/iADLs with > 75% accuracy with Min A cues.    6. Pt will repeat rote phrases with 25% speech intelligibility given max to moderate cues.              Baseline: unable             Goal status: MET  UPDATED goal: Pt will repeat rote phrases with ~ 75% speech intelligibility given minimal cues.   Goal status: ONGOING  Goal status: ONGOING  Goal status: MET  UPDATED GOAL: 09/13/2022  Pt will read sentences related to his ADLs/iADLs with > 75% speech intelligibility and moderate assistance.     LONG TERM GOALS: Target date: 10/25/2022   Pt will use multimodal communication to express basic wants/needs in 4 of 10 opportunities.  Baseline: unable Goal status: ONGOING   2.  Pt will demonstrate increased reading comprehension by selecting accurate answer from 3 choices in 2 out of 10 opportunities.  Baseline: unable Goal status: ONGOING   3.  Pt will correctly spell his name in 5 out of 10 opportunities.  Baseline: unable Goal status: MET   ASSESSMENT:  CLINICAL IMPRESSION: Pt continues to present with improving moderate expressive communication impairment and moderate apraxia of speech.   OBJECTIVE IMPAIRMENTS include expressive language, receptive language, aphasia, and apraxia. These impairments are limiting patient from managing medications, managing appointments, managing finances, household responsibilities, ADLs/IADLs, and effectively communicating at home and in community. Factors affecting potential to achieve goals and functional outcome are severity of impairments. Patient will benefit from skilled SLP services to address above impairments and improve overall function.  REHAB POTENTIAL: Excellent  PLAN: SLP FREQUENCY: 3x/week  SLP DURATION: 12 weeks  PLANNED INTERVENTIONS: Language  facilitation, Environmental controls, Internal/external aids, Functional tasks, Multimodal communication approach, SLP instruction and feedback, Compensatory strategies, and Patient/family education     Harve Spradley B. Rutherford Nail, M.S., CCC-SLP, Mining engineer Certified Brain Injury [place]  [place] Office [phone] Ascom [phone] Fax [phone]

## 2022-10-11 ENCOUNTER — Ambulatory Visit: Payer: Medicare HMO | Admitting: Speech Pathology

## 2022-10-11 DIAGNOSIS — R482 Apraxia: Secondary | ICD-10-CM | POA: Diagnosis not present

## 2022-10-11 DIAGNOSIS — R4701 Aphasia: Secondary | ICD-10-CM | POA: Diagnosis not present

## 2022-10-11 DIAGNOSIS — I6602 Occlusion and stenosis of left middle cerebral artery: Secondary | ICD-10-CM | POA: Diagnosis not present

## 2022-10-14 ENCOUNTER — Ambulatory Visit: Payer: Medicare HMO | Admitting: Speech Pathology

## 2022-10-14 DIAGNOSIS — I6602 Occlusion and stenosis of left middle cerebral artery: Secondary | ICD-10-CM | POA: Diagnosis not present

## 2022-10-14 DIAGNOSIS — R4701 Aphasia: Secondary | ICD-10-CM | POA: Diagnosis not present

## 2022-10-14 DIAGNOSIS — R482 Apraxia: Secondary | ICD-10-CM

## 2022-10-16 ENCOUNTER — Ambulatory Visit: Payer: Medicare HMO | Admitting: Speech Pathology

## 2022-10-16 DIAGNOSIS — R482 Apraxia: Secondary | ICD-10-CM | POA: Diagnosis not present

## 2022-10-16 DIAGNOSIS — I6602 Occlusion and stenosis of left middle cerebral artery: Secondary | ICD-10-CM | POA: Diagnosis not present

## 2022-10-16 DIAGNOSIS — R4701 Aphasia: Secondary | ICD-10-CM

## 2022-10-16 NOTE — Therapy (Addendum)
OUTPATIENT SPEECH LANGUAGE PATHOLOGY TREATMENT NOTE      Patient Name: Alan Stewart MRN: [id number] DOB:[birth year], 77 y.o., male Today's Date: 10/16/2022    PCP: Ramonita Lab, MD REFERRING PROVIDER: Ramonita Lab, MD   END OF SESSION  End of Session - 10/16/22 1233     Visit Number 86    Number of Visits 47    Date for SLP Re-Evaluation 10/25/22    Authorization Type [employer] HMO    Authorization Time Period 08/26/2022 thru 11/22/2022    Authorization - Visit Number [id number]    Authorization - Number of Visits 36    Progress Note Due on Visit 49    SLP Start Time 1000    SLP Stop Time  1100    SLP Time Calculation (min) 60 min    Activity Tolerance Patient tolerated treatment well                  Past Medical History:  Diagnosis Date   CAD (coronary artery disease)    a. 10/2021 NSTEMI/PCI: LM nl, LAD min irregs, LCX 21m(2.5x18 Onyx Frontier DES), OM1 nl, RCA 994m3.5x26 Onyx Frontier DES), 30d.   Diastolic dysfunction    a. 10/2021 Echo: EF 50-55%, no rwma, Gr1 DD, nl RV fxn. No significant valvular dzs.   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 SCCis. MOHs 07/08/22   Past Surgical History:  Procedure Laterality Date   COLONOSCOPY N/A 10/09/2015   Procedure: COLONOSCOPY;  Surgeon: PaHulen LusterMD;  [facility];  Service: Gastroenterology;  Laterality: N/A;   CORONARY STENT INTERVENTION N/A 10/22/2021   Procedure: CORONARY STENT INTERVENTION;  Surgeon: ArWellington HampshireMD;  [facility];  Service: Cardiovascular;  Laterality: N/A;   IR CT HEAD LTD  05/21/2022   IR PERCUTANEOUS ART THROMBECTOMY/INFUSION INTRACRANIAL INC DIAG ANGIO  05/21/2022   LEFT HEART CATH AND CORONARY ANGIOGRAPHY N/A 10/22/2021   Procedure: LEFT  HEART CATH AND CORONARY ANGIOGRAPHY;  Surgeon: ArWellington HampshireMD;  [facility];  Service: Cardiovascular;  Laterality: N/A;   LOOP RECORDER INSERTION N/A 05/23/2022   Procedure: LOOP RECORDER INSERTION;  Surgeon: LaVickie EpleyMD;  [facility];  Service: Cardiovascular;  Laterality: N/A;   RADIOLOGY WITH ANESTHESIA N/A 05/20/2022   Procedure: IR WITH ANESTHESIA;  Surgeon: DeLuanne BrasMD;  [place] Service: Radiology;  Laterality: N/A;   ROTATOR CUFF REPAIR     Patient Active Problem List   Diagnosis Date Noted   Stroke (cerebrum) (HCHondo10/10/2021   Middle cerebral artery embolism, 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 male with PMH: HTN, CAD, HLD, osteoarthritis, SCC, sleep apnea. He presented to the hospital on 05/20/22 with right sided weakness and aphasia with LKW reported by wife to be 10pm  on 10/2.  He arrived at [facility] as code stroke and taken for emergent CT/CTA which demonstrated left M1 occlusion. He received IV tenecteplase and IR thrombectomy due to large vessel occlusion.       DIAGNOSTIC FINDINGS:  MRI 05/22/2022 Cortical ischemia throughout the left MCA territory compatible with recent infarct. Relatively little affect on the white matter. 2. Small focus of acute hemorrhage along the anterior surface of the left temporal lobe Heidelberg classification 3c: Subarachnoid hemorrhage. 3. Normal intracranial MRA.  Restored flow in left MCA.    THERAPY DIAG:  Aphasia  Apraxia  Middle cerebral artery embolism, left  Rationale for  Evaluation and Treatment Rehabilitation  SUBJECTIVE: pt in a pleasant mood, wife states pt seems to have less errors during at home practice of speech activities.  Pt accompanied by: self and significant other  PAIN:  Are you having pain? No  PATIENT GOALS: to be able to communicate again  OBJECTIVE:   TODAY'S TREATMENT: Skilled treatment session focused on pt's communication goals. SLP facilitated session by providing the following interventions:  Pt's reading comprehension was targeted through use of functional phrases. When tasked to identify spoken phrases, pt achieved 88%.    Vocabulary building  A Workbook for Aphasia by [employer] utilized  Category cross-out Level: Easy p 6 - 17/20 improving to 20/20 with minimal cues Level: Hard p7 -  18/20 improving to 20/20 with supervision level cues  Category Fill-Ins Level: Easy p8 - 20/20 with Mod I Level Hard p9 - 20/20 with rare min A    PATIENT EDUCATION: Education details: see above Person educated: Patient and Wife Education method: Explanation, Media planner, Verbal cues, and Handouts Education comprehension: verbalized understanding  HOME EXERCISE PROGRAM:  Complete calendar  GOALS: Goals reviewed with patient? Yes  SHORT TERM GOALS: Target date: 10 sessions  UPDATED - 06/24/2022  UPDATED - 07/19/2022  UPDATED - 08/20/2022  UPDATED - 09/13/2022 UPDATED 10/09/2022 Given moderate assistance, pt will answer simple basic yes/no questions with 50% accuracy.  Baseline: 25% Goal status: MET UPDATED goal: Given minimal assistance, pt will answer simple basic yes/no questions with 75% accuracy.  Goal status: MET   2.  Given moderate assistance, pt will follow simple basic 1 step directions with 50% accuracy.  Baseline: 0% Goal status: MET  UPDATED goal: Given minimal assistance, pt will following simple basic 1 step directions with 75% accuracy.   Goal status: MET   3.  With maximal multimodal cues,  pt will name set of 10 common objects in 8 out of 10 opportunities.  Baseline: unable Goal status: MET UPDATED goal: With moderate cues, pt will name set of 10 common objects in 8 out of 10 opportunities.  Goal status: MET UPDATED goal: With minimal cues, pt will name set of 10 common objects in 8 out of 10 opportunities.  Goal status: ongoing  4. Pt will improve reading abilities by placing phrase level object function with object in field of 3 with 50% ability and moderate cues.              Baseline: object name to object - 90%             Goal status: MET  UPDATED goal: Pt will demonstrate reading comprehension at the sentence level for basic information with > 75% accuracy and min A cues.   Goal status: ONGOING  Goal status: ONGOING  UPDATED GOAL: 09/13/2022  Pt will demonstrate reading comprehension at the sentence level for ADLs/iADLS with >  75% accuracy given min A cues.    5. Pt will improve spelling abilities by completing common words by filling in missing letter with 50% ability and moderate cues.              Baseline: unable             Goal status: MET UPDATED goal: Pt will improve spelling abilities by completing common words by filling in missing letters with >90% accuracy and rare min A cues.  Goal status: MET UPDATED goal: Pt will improve spelling ability by writing names of basic objects with supervision level cues in 8 out of 10 opportunities.  Goal status: ONGOING UPDATED GOAL: 09/13/2022 Pt will improve spelling ability by accurately writing sentence level description of his ADLs/iADLs with > 75% accuracy with Min A cues.    6. Pt will repeat rote phrases with 25% speech intelligibility given max to moderate cues.              Baseline: unable             Goal status: MET  UPDATED goal: Pt will repeat rote phrases with ~ 75% speech intelligibility given minimal cues.   Goal status: ONGOING  Goal status: ONGOING  Goal status: MET  UPDATED GOAL: 09/13/2022  Pt  will read sentences related to his ADLs/iADLs with > 75% speech intelligibility and moderate assistance.     LONG TERM GOALS: Target date: 10/25/2022   Pt will use multimodal communication to express basic wants/needs in 4 of 10 opportunities.  Baseline: unable Goal status: ONGOING   2.  Pt will demonstrate increased reading comprehension by selecting accurate answer from 3 choices in 2 out of 10 opportunities.  Baseline: unable Goal status: ONGOING   3.  Pt will correctly spell his name in 5 out of 10 opportunities.  Baseline: unable Goal status: MET   ASSESSMENT:  CLINICAL IMPRESSION: Pt continues to present with improving moderate expressive communication impairment and moderate apraxia of speech.   OBJECTIVE IMPAIRMENTS include expressive language, receptive language, aphasia, and apraxia. These impairments are limiting patient from managing medications, managing appointments, managing finances, household responsibilities, ADLs/IADLs, and effectively communicating at home and in community. Factors affecting potential to achieve goals and functional outcome are severity of impairments. Patient will benefit from skilled SLP services to address above impairments and improve overall function.  REHAB POTENTIAL: Excellent  PLAN: SLP FREQUENCY: 3x/week  SLP DURATION: 12 weeks  PLANNED INTERVENTIONS: Language facilitation, Environmental controls, Internal/external aids, Functional tasks, Multimodal communication approach, SLP instruction and feedback, Compensatory strategies, and Patient/family education     Lyvia Mondesir B. Rutherford Nail, M.S., CCC-SLP, Mining engineer Certified Brain Injury [place]  [place] Office [phone] Ascom [phone] Fax [phone]

## 2022-10-16 NOTE — Therapy (Signed)
OUTPATIENT SPEECH LANGUAGE PATHOLOGY TREATMENT NOTE     Patient Name: Alan Stewart MRN: [id number] DOB:October 09, 1945, 77 y.o., male Today's Date: 10/11/2022   PCP: Ramonita Lab, MD REFERRING PROVIDER: Ramonita Lab, MD  END OF SESSION  End of Session - 10/11/22 1211     Visit Number 4    Number of Visits 13    Date for SLP Re-Evaluation 10/25/22    Authorization Type [employer] H[employer]    Authorization Time Period 08/26/2022 thru 11/22/2022    Authorization - Visit Number [id number]    Authorization - Number of Visits 36    Progress Note Due on Visit 64   SLP Start Time 1000    SLP Stop Time  1100    SLP Time Calculation (min) 60 min    Activity Tolerance Patient tolerated treatment well               Past Medical History:  Diagnosis Date   CAD (coronary artery disease)    a. 10/2021 NSTEMI/PCI: LM nl, LAD min irregs, LCX 59m(2.5x18 Onyx Frontier DES), OM1 nl, RCA 955m3.5x26 Onyx Frontier DES), 30d.   Diastolic dysfunction    a. 10/2021 Echo: EF 50-55%, no rwma, Gr1 DD, nl RV fxn. No significant valvular dzs.   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 SCCis. MOHs 07/08/22   Past Surgical History:  Procedure Laterality Date   COLONOSCOPY N/A 10/09/2015   Procedure: COLONOSCOPY;  Surgeon: PaHulen LusterMD;  [facility];  Service: Gastroenterology;  Laterality: N/A;   CORONARY STENT INTERVENTION N/A 10/22/2021   Procedure: CORONARY STENT INTERVENTION;  Surgeon: ArWellington HampshireMD;  [facility];  Service: Cardiovascular;  Laterality: N/A;   IR CT HEAD LTD  05/21/2022   IR PERCUTANEOUS ART THROMBECTOMY/INFUSION INTRACRANIAL INC DIAG ANGIO  05/21/2022   LEFT HEART CATH AND CORONARY ANGIOGRAPHY N/A 10/22/2021   Procedure: LEFT HEART CATH AND  CORONARY ANGIOGRAPHY;  Surgeon: ArWellington HampshireMD;  [facility];  Service: Cardiovascular;  Laterality: N/A;   LOOP RECORDER INSERTION N/A 05/23/2022   Procedure: LOOP RECORDER INSERTION;  Surgeon: LaVickie EpleyMD;  [facility];  Service: Cardiovascular;  Laterality: N/A;   RADIOLOGY WITH ANESTHESIA N/A 05/20/2022   Procedure: IR WITH ANESTHESIA;  Surgeon: DeLuanne BrasMD;  [facility]: [facility] Service: Radiology;  Laterality: N/A;   ROTATOR CUFF REPAIR     Patient Active Problem List   Diagnosis Date Noted   Stroke (cerebrum) ([facility]10/10/2021   Middle cerebral artery embolism, 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 male with PMH: HTN, CAD, HLD, osteoarthritis, SCC, sleep apnea. He presented to the hospital on 05/20/22 with right sided weakness and aphasia with LKW reported by wife to be 10pm on 10/2.  [facility] for emergent CT/CTA which demonstrated left M1 occlusion. He received IV tenecteplase and IR thrombectomy due to large vessel occlusion.       DIAGNOSTIC FINDINGS:  MRI 05/22/2022 Cortical ischemia throughout the left MCA territory compatible with recent infarct. Relatively little affect on the white matter. 2. Small focus of acute hemorrhage along the anterior surface of the left temporal lobe Heidelberg classification 3c: Subarachnoid hemorrhage. 3. Normal intracranial MRA.  Restored flow in left MCA.    THERAPY DIAG:  Aphasia  Apraxia  Middle cerebral artery embolism, left  Rationale for Evaluation and  Treatment Rehabilitation  SUBJECTIVE: pt in a pleasant mood, wife states pt seems to have less errors during at home practice of speech activities.  Pt accompanied by: self and significant other  PAIN:  Are you having pain? No  PATIENT GOALS: to be able to communicate again  OBJECTIVE:   TODAY'S TREATMENT: Skilled treatment session focused on pt's communication goals. SLP facilitated session by providing the following interventions:  Pt's reading comprehension was targeted through use of functional phrases. When tasked to identify spoken phrases, pt achieved 77% accuracy.  Targeted vocabulary thru use of opposites - taught using common objects/functional basic words SLP taught pt the ocncept of opposites. For example supper vs breakfast  SLP made worksheets with the words that were created in the session for pt to complete for homework.   PATIENT EDUCATION: Education details: see above Person educated: Patient and Wife Education method: Explanation, Media planner, Verbal cues, and Handouts Education comprehension: verbalized understanding  HOME EXERCISE PROGRAM:  Complete calendar  GOALS: Goals reviewed with patient? Yes  SHORT TERM GOALS: Target date: 10 sessions  UPDATED - 06/24/2022  UPDATED - 07/19/2022  UPDATED - 08/20/2022  UPDATED - 09/13/2022 UPDATED 10/09/2022 Given moderate assistance, pt will answer simple basic yes/no questions with 50% accuracy.  Baseline: 25% Goal status: MET UPDATED goal: Given minimal assistance, pt will answer simple basic yes/no questions with 75% accuracy.  Goal status: MET   2.  Given moderate assistance, pt will follow simple basic 1 step directions with 50% accuracy.  Baseline: 0% Goal status: MET  UPDATED goal: Given minimal assistance, pt will following simple basic 1 step directions with 75% accuracy.   Goal status: MET   3.  With maximal multimodal cues, pt will name set of 10 common objects in 8 out of 10 opportunities.   Baseline: unable Goal status: MET UPDATED goal: With moderate cues, pt will name set of 10 common objects in 8 out of 10 opportunities.  Goal status: MET UPDATED goal: With minimal cues, pt will name set of 10 common objects in 8 out of 10 opportunities.  Goal status: ongoing  4. Pt will improve reading abilities by placing phrase level object function with object in field of 3 with 50% ability and moderate cues.              Baseline: object name to object - 90%             Goal status: MET  UPDATED goal: Pt will demonstrate reading comprehension at the sentence level for basic information with > 75% accuracy and min A cues.   Goal status: ONGOING  Goal status: ONGOING  UPDATED GOAL: 09/13/2022  Pt will demonstrate reading comprehension at the sentence level for ADLs/iADLS with [id number] accuracy given min A cues.    5. Pt will improve spelling abilities by completing common words by filling in missing letter  with 50% ability and moderate cues.              Baseline: unable             Goal status: MET UPDATED goal: Pt will improve spelling abilities by completing common words by filling in missing letters with >90% accuracy and rare min A cues.  Goal status: MET UPDATED goal: Pt will improve spelling ability by writing names of basic objects with supervision level cues in 8 out of 10 opportunities.  Goal status: ONGOING UPDATED GOAL: 09/13/2022 Pt will improve spelling ability by accurately writing sentence level description of his ADLs/iADLs with > 75% accuracy with Min A cues.    6. Pt will repeat rote phrases with 25% speech intelligibility given max to moderate cues.              Baseline: unable             Goal status: MET  UPDATED goal: Pt will repeat rote phrases with ~ 75% speech intelligibility given minimal cues.   Goal status: ONGOING  Goal status: ONGOING  Goal status: MET  UPDATED GOAL: 09/13/2022  Pt will read sentences related to his ADLs/iADLs with > 75% speech  intelligibility and moderate assistance.     LONG TERM GOALS: Target date: 10/25/2022   Pt will use multimodal communication to express basic wants/needs in 4 of 10 opportunities.  Baseline: unable Goal status: ONGOING   2.  Pt will demonstrate increased reading comprehension by selecting accurate answer from 3 choices in 2 out of 10 opportunities.  Baseline: unable Goal status: ONGOING   3.  Pt will correctly spell his name in 5 out of 10 opportunities.  Baseline: unable Goal status: MET   ASSESSMENT:  CLINICAL IMPRESSION: Pt continues to present with improving moderate expressive communication impairment and moderate apraxia of speech.   OBJECTIVE IMPAIRMENTS include expressive language, receptive language, aphasia, and apraxia. These impairments are limiting patient from managing medications, managing appointments, managing finances, household responsibilities, ADLs/IADLs, and effectively communicating at home and in community. Factors affecting potential to achieve goals and functional outcome are severity of impairments. Patient will benefit from skilled SLP services to address above impairments and improve overall function.  REHAB POTENTIAL: Excellent  PLAN: SLP FREQUENCY: 3x/week  SLP DURATION: 12 weeks  PLANNED INTERVENTIONS: Language facilitation, Environmental controls, Internal/external aids, Functional tasks, Multimodal communication approach, SLP instruction and feedback, Compensatory strategies, and Patient/family education     Chairty Toman B. Rutherford Nail, M.S., CCC-SLP, Mining engineer Certified Brain Injury [place]  [place] Office [phone] Ascom [phone] Fax [phone]

## 2022-10-16 NOTE — Therapy (Signed)
OUTPATIENT SPEECH LANGUAGE PATHOLOGY TREATMENT NOTE      Patient Name: Alan Stewart MRN: [id number] DOB:1946-03-30, 77 y.o., male Today's Date: 10/14/2022   PCP: Ramonita Lab, MD REFERRING PROVIDER: Ramonita Lab, MD   END OF SESSION  End of Session - 10/14/22 1011     Visit Number 14    Number of Visits 6    Date for SLP Re-Evaluation 10/25/22    Authorization Type [employer] HMO    Authorization Time Period 08/26/2022 thru 11/22/2022    Authorization - Visit Number 75   Authorization - Number of Visits 36    Progress Note Due on Visit 13   SLP Start Time 0900    SLP Stop Time  1000    SLP Time Calculation (min) 60 min    Activity Tolerance Patient tolerated treatment well                 Past Medical History:  Diagnosis Date   CAD (coronary artery disease)    a. 10/2021 NSTEMI/PCI: LM nl, LAD min irregs, LCX 73m(2.5x18 Onyx Frontier DES), OM1 nl, RCA 947m3.5x26 Onyx Frontier DES), 30d.   Diastolic dysfunction    a. 10/2021 Echo: EF 50-55%, no rwma, Gr1 DD, nl RV fxn. No significant valvular dzs.   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 SCCis. MOHs 07/08/22   Past Surgical History:  Procedure Laterality Date   COLONOSCOPY N/A 10/09/2015   Procedure: COLONOSCOPY;  Surgeon: PaHulen LusterMD;  [facility];  Service: Gastroenterology;  Laterality: N/A;   CORONARY STENT INTERVENTION N/A 10/22/2021   Procedure: CORONARY STENT INTERVENTION;  Surgeon: ArWellington HampshireMD;  [place];  Service: Cardiovascular;  Laterality: N/A;   IR CT HEAD LTD  05/21/2022   IR PERCUTANEOUS ART THROMBECTOMY/INFUSION INTRACRANIAL INC DIAG ANGIO  05/21/2022   LEFT HEART CATH AND CORONARY ANGIOGRAPHY N/A 10/22/2021   Procedure: LEFT HEART  CATH AND CORONARY ANGIOGRAPHY;  Surgeon: ArWellington HampshireMD;  [facility];  Service: Cardiovascular;  Laterality: N/A;   LOOP RECORDER INSERTION N/A 05/23/2022   Procedure: LOOP RECORDER INSERTION;  Surgeon: LaVickie EpleyMD;  [facility];  Service: Cardiovascular;  Laterality: N/A;   RADIOLOGY WITH ANESTHESIA N/A 05/20/2022   Procedure: IR WITH ANESTHESIA;  Surgeon: DeLuanne BrasMD;  Location: [facility] Service: Radiology;  Laterality: N/A;   ROTATOR CUFF REPAIR     Patient Active Problem List   Diagnosis Date Noted   Stroke (cerebrum) (HCNew All[place]10/10/2021   Middle cerebral artery embolism, 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 male with PMH: HTN, CAD, HLD, osteoarthritis, SCC, sleep apnea. He presented to the hospital on 05/20/22 with right sided weakness and aphasia with LKW reported by wife to be 10pm on 10/2.  He  arrived at [facility] as code stroke and taken for emergent CT/CTA which demonstrated left M1 occlusion. He received IV tenecteplase and IR thrombectomy due to large vessel occlusion.       DIAGNOSTIC FINDINGS:  MRI 05/22/2022 Cortical ischemia throughout the left MCA territory compatible with recent infarct. Relatively little affect on the white matter. 2. Small focus of acute hemorrhage along the anterior surface of the left temporal lobe Heidelberg classification 3c: Subarachnoid hemorrhage. 3. Normal intracranial MRA.  Restored flow in left MCA.    THERAPY DIAG:  Aphasia  Apraxia  Middle cerebral artery embolism, left  Rationale for  Evaluation and Treatment Rehabilitation  SUBJECTIVE: pt in a pleasant mood, wife states pt seems to have less errors during at home practice of speech activities.  Pt accompanied by: self and significant other  PAIN:  Are you having pain? No  PATIENT GOALS: to be able to communicate again  OBJECTIVE:   TODAY'S TREATMENT: Skilled treatment session focused on pt's communication goals. SLP facilitated session by providing the following interventions:  Pt's reading comprehension was targeted through use of functional phrases. When tasked to identify spoken phrases, pt achieved 81%.    Pt brought in completed homework and achieved 100% accuracy.   Basic opposites worksheets when given the opposite as a scrambled word rare Min A to achieve 80%  numbers - Mod I with counting to state correct number accurate when counting   PATIENT EDUCATION: Education details: see above Person educated: Patient and Wife Education method: Explanation, Demonstration, Verbal cues, and Handouts Education comprehension: verbalized understanding  HOME EXERCISE PROGRAM:  Complete calendar  GOALS: Goals reviewed with patient? Yes  SHORT TERM GOALS: Target date: 10 sessions  UPDATED - 06/24/2022  UPDATED - 07/19/2022  UPDATED - 08/20/2022  UPDATED - 09/13/2022 UPDATED 10/09/2022 Given moderate assistance, pt will answer simple basic yes/no questions with 50% accuracy.  Baseline: 25% Goal status: MET UPDATED goal: Given minimal assistance, pt will answer simple basic yes/no questions with 75% accuracy.  Goal status: MET   2.  Given moderate assistance, pt will follow simple basic 1 step directions with 50% accuracy.  Baseline: 0% Goal status: MET  UPDATED goal: Given minimal assistance, pt will following simple basic 1 step directions with 75% accuracy.   Goal status: MET   3.  With maximal multimodal cues, pt will name set of 10 common objects in 8 out of 10 opportunities.  Baseline:  unable Goal status: MET UPDATED goal: With moderate cues, pt will name set of 10 common objects in 8 out of 10 opportunities.  Goal status: MET UPDATED goal: With minimal cues, pt will name set of 10 common objects in 8 out of 10 opportunities.  Goal status: ongoing  4. Pt will improve reading abilities by placing phrase level object function with object in field of 3 with 50% ability and moderate cues.              Baseline: object name to object - 90%             Goal status: MET  UPDATED goal: Pt will demonstrate reading comprehension at the sentence level for basic information with > 75% accuracy and min A cues.   Goal status: ONGOING  Goal status: ONGOING  UPDATED GOAL: 09/13/2022  Pt will demonstrate reading comprehension at the sentence level for ADLs/iADLS with [id number] accuracy given min A cues.    5. Pt will improve spelling abilities by completing common words by filling in  missing letter with 50% ability and moderate cues.              Baseline: unable             Goal status: MET UPDATED goal: Pt will improve spelling abilities by completing common words by filling in missing letters with >90% accuracy and rare min A cues.  Goal status: MET UPDATED goal: Pt will improve spelling ability by writing names of basic objects with supervision level cues in 8 out of 10 opportunities.  Goal status: ONGOING UPDATED GOAL: 09/13/2022 Pt will improve spelling ability by accurately writing sentence level description of his ADLs/iADLs with > 75% accuracy with Min A cues.    6. Pt will repeat rote phrases with 25% speech intelligibility given max to moderate cues.              Baseline: unable             Goal status: MET  UPDATED goal: Pt will repeat rote phrases with ~ 75% speech intelligibility given minimal cues.   Goal status: ONGOING  Goal status: ONGOING  Goal status: MET  UPDATED GOAL: 09/13/2022  Pt will read sentences related to his ADLs/iADLs with > 75% speech intelligibility  and moderate assistance.     LONG TERM GOALS: Target date: 10/25/2022   Pt will use multimodal communication to express basic wants/needs in 4 of 10 opportunities.  Baseline: unable Goal status: ONGOING   2.  Pt will demonstrate increased reading comprehension by selecting accurate answer from 3 choices in 2 out of 10 opportunities.  Baseline: unable Goal status: ONGOING   3.  Pt will correctly spell his name in 5 out of 10 opportunities.  Baseline: unable Goal status: MET   ASSESSMENT:  CLINICAL IMPRESSION: Pt continues to present with improving moderate expressive communication impairment and moderate apraxia of speech.   OBJECTIVE IMPAIRMENTS include expressive language, receptive language, aphasia, and apraxia. These impairments are limiting patient from managing medications, managing appointments, managing finances, household responsibilities, ADLs/IADLs, and effectively communicating at home and in community. Factors affecting potential to achieve goals and functional outcome are severity of impairments. Patient will benefit from skilled SLP services to address above impairments and improve overall function.  REHAB POTENTIAL: Excellent  PLAN: SLP FREQUENCY: 3x/week  SLP DURATION: 12 weeks  PLANNED INTERVENTIONS: Language facilitation, Environmental controls, Internal/external aids, Functional tasks, Multimodal communication approach, SLP instruction and feedback, Compensatory strategies, and Patient/family education     Ashon Rosenberg B. Rutherford Nail, M.S., CCC-SLP, Mining engineer Certified Brain Injury [place]  [place] Office [phone] Ascom [phone] Fax [phone]

## 2022-10-18 ENCOUNTER — Ambulatory Visit: Payer: Medicare HMO | Attending: Internal Medicine | Admitting: Speech Pathology

## 2022-10-18 DIAGNOSIS — R482 Apraxia: Secondary | ICD-10-CM | POA: Diagnosis not present

## 2022-10-18 DIAGNOSIS — R4701 Aphasia: Secondary | ICD-10-CM | POA: Insufficient documentation

## 2022-10-18 DIAGNOSIS — I6602 Occlusion and stenosis of left middle cerebral artery: Secondary | ICD-10-CM | POA: Insufficient documentation

## 2022-10-21 ENCOUNTER — Ambulatory Visit: Payer: Medicare HMO | Admitting: Speech Pathology

## 2022-10-21 DIAGNOSIS — I6602 Occlusion and stenosis of left middle cerebral artery: Secondary | ICD-10-CM

## 2022-10-21 DIAGNOSIS — R482 Apraxia: Secondary | ICD-10-CM

## 2022-10-21 DIAGNOSIS — R4701 Aphasia: Secondary | ICD-10-CM | POA: Diagnosis not present

## 2022-10-23 ENCOUNTER — Ambulatory Visit: Payer: Medicare HMO | Admitting: Speech Pathology

## 2022-10-23 NOTE — Therapy (Signed)
OUTPATIENT SPEECH LANGUAGE PATHOLOGY TREATMENT NOTE      Patient Name: Alan Stewart MRN: [id number] DOB:1946/02/06, 77 y.o., male Today's Date: 10/18/2022    PCP: Ramonita Lab, MD REFERRING PROVIDER: Ramonita Lab, MD   END OF SESSION  End of Session - 10/18/22 1958     Visit Number 33    Number of Visits 61    Date for SLP Re-Evaluation 10/25/22    Authorization Type [employer] H[employer]    Authorization Time Period 08/26/2022 thru 11/22/2022    Authorization - Visit Number [id number]    Authorization - Number of Visits 36    Progress Note Due on Visit 44    SLP Start Time 1000    SLP Stop Time  1100    SLP Time Calculation (min) 60 min    Activity Tolerance Patient tolerated treatment well          Past Medical History:  Diagnosis Date   CAD (coronary artery disease)    a. 10/2021 NSTEMI/PCI: LM nl, LAD min irregs, LCX 68m(2.5x18 Onyx Frontier DES), OM1 nl, RCA 982m3.5x26 Onyx Frontier DES), 30d.   Diastolic dysfunction    a. 10/2021 Echo: EF 50-55%, no rwma, Gr1 DD, nl RV fxn. No significant valvular dzs.   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 SCCis. MOHs 07/08/22   Past Surgical History:  Procedure Laterality Date   COLONOSCOPY N/A 10/09/2015   Procedure: COLONOSCOPY;  Surgeon: PaHulen LusterMD;  [facility];  Service: Gastroenterology;  Laterality: N/A;   CORONARY STENT INTERVENTION N/A 10/22/2021   Procedure: CORONARY STENT INTERVENTION;  Surgeon: ArWellington HampshireMD;  [facility]: [facility];  Service: Cardiovascular;  Laterality: N/A;   IR CT HEAD LTD  05/21/2022   IR PERCUTANEOUS ART THROMBECTOMY/INFUSION INTRACRANIAL INC DIAG ANGIO  05/21/2022   LEFT HEART CATH AND CORONARY ANGIOGRAPHY N/A 10/22/2021   Procedure: LEFT HEART CATH AND  CORONARY ANGIOGRAPHY;  Surgeon: ArWellington HampshireMD;  [facility];  Service: Cardiovascular;  Laterality: N/A;   LOOP RECORDER INSERTION N/A 05/23/2022   Procedure: LOOP RECORDER INSERTION;  Surgeon: LaVickie EpleyMD;  [facility];  Service: Cardiovascular;  Laterality: N/A;   RADIOLOGY WITH ANESTHESIA N/A 05/20/2022   Procedure: IR WITH ANESTHESIA;  Surgeon: DeLuanne BrasMD;  [facility] Service[facility] Radiology;  Laterality: N/A;   ROTATOR CUFF REPAIR     Patient Active Problem List   Diagnosis Date Noted   Stroke (cerebrum) (H[facility]10/10/2021   Middle cerebral artery embolism, 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 male with PMH: HTN, CAD, HLD, osteoarthritis, SCC, sleep apnea. He presented to the hospital on 05/20/22 with right sided weakness and aphasia with LKW reported by wife to be 10pm on 10/2.  [facility] and taken for emergent CT/CTA which demonstrated left M1 occlusion. He received IV tenecteplase and IR thrombectomy due to large vessel occlusion.       DIAGNOSTIC FINDINGS:  MRI 05/22/2022 Cortical ischemia throughout the left MCA territory compatible with recent infarct. Relatively little affect on the white matter. 2. Small focus of acute hemorrhage along the anterior surface of the left temporal lobe Heidelberg classification 3c: Subarachnoid hemorrhage. 3. Normal intracranial MRA.  Restored flow in left MCA.    THERAPY DIAG:  Aphasia  Apraxia  Middle cerebral artery embolism, left  Rationale for Evaluation and  Treatment Rehabilitation  SUBJECTIVE: pt in a pleasant mood, wife states pt seems to have less errors during at home practice of speech activities.  Pt accompanied by: self and significant other  PAIN:  Are you having pain? No  PATIENT GOALS: to be able to communicate again  OBJECTIVE:   TODAY'S TREATMENT: Skilled treatment session focused on pt's communication goals. SLP facilitated session by providing the following interventions:  Vocabulary building  A Workbook for Aphasia by [employer] utilized  Divergent naming: Categories Level: Easy p 10 with moderate to minimal assistance pt able to list 6 items for each of the following categories with rare min A for spelling  Farm animals  [place]  Things to drink  [employer]  Furniture  Occupations  Naming numbers - moderate assistance to count items on card for correct naming of numbers  PATIENT EDUCATION: Education details: see above Person educated: Patient and Wife Education method: Consulting civil engineer, Media planner, Verbal cues, and Handouts Education comprehension: verbalized understanding  HOME EXERCISE PROGRAM:  Complete calendar  GOALS: Goals reviewed with patient? Yes  SHORT TERM GOALS: Target date: 10 sessions  UPDATED - 06/24/2022  UPDATED - 07/19/2022  UPDATED - 08/20/2022  UPDATED - 09/13/2022 UPDATED 10/09/2022 Given moderate assistance, pt will answer simple basic yes/no questions with 50% accuracy.  Baseline: 25% Goal status: MET UPDATED goal: Given minimal assistance, pt will answer simple basic yes/no questions with 75% accuracy.  Goal status: MET   2.  Given moderate assistance, pt will follow simple basic 1 step directions with 50% accuracy.  Baseline: 0% Goal status: MET  UPDATED goal: Given minimal assistance, pt will following simple basic 1 step directions with 75% accuracy.   Goal status: MET   3.  With maximal multimodal cues, pt will name set of 10  common objects in 8 out of 10 opportunities.  Baseline: unable Goal status: MET UPDATED goal: With moderate cues, pt will name set of 10 common objects in 8 out of 10 opportunities.  Goal status: MET UPDATED goal: With minimal cues, pt will name set of 10 common objects in 8 out of 10 opportunities.  Goal status: ongoing  4. Pt will improve reading abilities by placing phrase level object function with object in field of 3 with 50% ability and moderate cues.              Baseline: object name to object - 90%             Goal status: MET  UPDATED goal: Pt will demonstrate reading comprehension at the sentence level for basic information with > 75% accuracy and min A cues.   Goal status: ONGOING  Goal status: ONGOING  UPDATED GOAL: 09/13/2022  Pt will demonstrate reading comprehension at the sentence level for ADLs/iADLS with [id number] accuracy given min A cues.    5. Pt will  improve spelling abilities by completing common words by filling in missing letter with 50% ability and moderate cues.              Baseline: unable             Goal status: MET UPDATED goal: Pt will improve spelling abilities by completing common words by filling in missing letters with >90% accuracy and rare min A cues.  Goal status: MET UPDATED goal: Pt will improve spelling ability by writing names of basic objects with supervision level cues in 8 out of 10 opportunities.  Goal status: ONGOING UPDATED GOAL: 09/13/2022 Pt will improve spelling ability by accurately writing sentence level description of his ADLs/iADLs with > 75% accuracy with Min A cues.    6. Pt will repeat rote phrases with 25% speech intelligibility given max to moderate cues.              Baseline: unable             Goal status: MET  UPDATED goal: Pt will repeat rote phrases with ~ 75% speech intelligibility given minimal cues.   Goal status: ONGOING  Goal status: ONGOING  Goal status: MET  UPDATED GOAL: 09/13/2022  Pt will read sentences  related to his ADLs/iADLs with > 75% speech intelligibility and moderate assistance.     LONG TERM GOALS: Target date: 10/25/2022   Pt will use multimodal communication to express basic wants/needs in 4 of 10 opportunities.  Baseline: unable Goal status: ONGOING   2.  Pt will demonstrate increased reading comprehension by selecting accurate answer from 3 choices in 2 out of 10 opportunities.  Baseline: unable Goal status: ONGOING   3.  Pt will correctly spell his name in 5 out of 10 opportunities.  Baseline: unable Goal status: MET   ASSESSMENT:  CLINICAL IMPRESSION: Pt continues to present with improving moderate expressive communication impairment and moderate apraxia of speech.   OBJECTIVE IMPAIRMENTS include expressive language, receptive language, aphasia, and apraxia. These impairments are limiting patient from managing medications, managing appointments, managing finances, household responsibilities, ADLs/IADLs, and effectively communicating at home and in community. Factors affecting potential to achieve goals and functional outcome are severity of impairments. Patient will benefit from skilled SLP services to address above impairments and improve overall function.  REHAB POTENTIAL: Excellent  PLAN: SLP FREQUENCY: 3x/week  SLP DURATION: 12 weeks  PLANNED INTERVENTIONS: Language facilitation, Environmental controls, Internal/external aids, Functional tasks, Multimodal communication approach, SLP instruction and feedback, Compensatory strategies, and Patient/family education     Jaryn Rosko B. Rutherford Nail, M.S., CCC-SLP, Mining engineer Certified Brain Injury [place]  [place] Office [phone] Ascom [phone] Fax [phone]

## 2022-10-23 NOTE — Therapy (Signed)
OUTPATIENT SPEECH LANGUAGE PATHOLOGY TREATMENT NOTE      Patient Name: Alan Stewart MRN: [id number] DOB:[birth year], 77 y.o., male Today's Date: 10/20/2022    PCP: Ramonita Lab, MD REFERRING PROVIDER: Ramonita Lab, MD   END OF SESSION  End of Session - 10/20/22 2051     Visit Number 22    Number of Visits 47    Date for SLP Re-Evaluation 10/25/22    Authorization Type [employer] HMO    Authorization Time Period 08/26/2022 thru 11/22/2022    Authorization - Visit Number 38    Authorization - Number of Visits 36    Progress Note Due on Visit 81    SLP Start Time 0900    SLP Stop Time  1000    SLP Time Calculation (min) 60 min    Activity Tolerance Patient tolerated treatment well               Past Medical History:  Diagnosis Date   CAD (coronary artery disease)    a. 10/2021 NSTEMI/PCI: LM nl, LAD min irregs, LCX 21m(2.5x18 Onyx Frontier DES), OM1 nl, RCA 939m3.5x26 Onyx Frontier DES), 30d.   Diastolic dysfunction    a. 10/2021 Echo: EF 50-55%, no rwma, Gr1 DD, nl RV fxn. No significant valvular dzs.   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 SCCis. MOHs 07/08/22   Past Surgical History:  Procedure Laterality Date   COLONOSCOPY N/A 10/09/2015   Procedure: COLONOSCOPY;  Surgeon: PaHulen LusterMD;  [facility];  Service: Gastroenterology;  Laterality: N/A;   CORONARY STENT INTERVENTION N/A 10/22/2021   Procedure: CORONARY STENT INTERVENTION;  Surgeon: ArWellington HampshireMD;  [facility];  Service: Cardiovascular;  Laterality: N/A;   IR CT HEAD LTD  05/21/2022   IR PERCUTANEOUS ART THROMBECTOMY/INFUSION INTRACRANIAL INC DIAG ANGIO  05/21/2022   LEFT HEART CATH AND CORONARY ANGIOGRAPHY N/A 10/22/2021   Procedure: LEFT HEART  CATH AND CORONARY ANGIOGRAPHY;  Surgeon: ArWellington HampshireMD;  [facility];  Service: Cardiovascular;  Laterality: N/A;   LOOP RECORDER INSERTION N/A 05/23/2022   Procedure: LOOP RECORDER INSERTION;  Surgeon: LaVickie EpleyMD;  [facility];  Service: Cardiovascular;  Laterality: N/A;   RADIOLOGY WITH ANESTHESIA N/A 05/20/2022   Procedure: IR WITH ANESTHESIA;  Surgeon: DeLuanne BrasMD;  [place] [facility] Service: Radiology;  Laterality: N/A;   ROTATOR CUFF REPAIR     Patient Active Problem List   Diagnosis Date Noted   Stroke (cerebrum) (HCGordon10/10/2021   Middle cerebral artery embolism, 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 male with PMH: HTN, CAD, HLD, osteoarthritis, SCC, sleep apnea. He presented to the hospital on 05/20/22 with right sided weakness and aphasia with LKW reported by wife to be 10pm on 10/2.  He [facility] at [facility] as code stroke and taken for emergent CT/CTA which demonstrated left M1 occlusion. He received IV tenecteplase and IR thrombectomy due to large vessel occlusion.       DIAGNOSTIC FINDINGS:  MRI 05/22/2022 Cortical ischemia throughout the left MCA territory compatible with recent infarct. Relatively little affect on the white matter. 2. Small focus of acute hemorrhage along the anterior surface of the left temporal lobe Heidelberg classification 3c: Subarachnoid hemorrhage. 3. Normal intracranial MRA.  Restored flow in left MCA.    THERAPY DIAG:  Aphasia  Apraxia  Middle cerebral artery embolism, left  Rationale for  Evaluation and Treatment Rehabilitation  SUBJECTIVE: pt in a pleasant mood, wife states pt seems to have less errors during at home practice of speech activities.  Pt accompanied by: self and significant other  PAIN:  Are you having pain? No  PATIENT GOALS: to be able to communicate again  OBJECTIVE:   TODAY'S TREATMENT: Skilled treatment session focused on pt's communication goals. SLP facilitated session by providing the following interventions:  Pt's reading comprehension was targeted through use of functional phrases. When tasked to identify spoken phrases, pt achieved 88%.    Vocabulary building  A Workbook for Aphasia by [employer] utilized  Divergent naming: Categories Level: Easy p 10 with moderate to minimal assistance pt able to list 6 items for each of the following categories with rare min A for spelling  Emotions  Types of balls Breakfast foods  Naming numbers - minimal assistance to count items on card for correct naming of numbers  PATIENT EDUCATION: Education details: see above Person educated: Patient and Wife Education method: Explanation, Demonstration, Verbal cues, and Handouts Education comprehension: verbalized understanding  HOME EXERCISE PROGRAM:  Complete calendar  GOALS: Goals reviewed with patient? Yes  SHORT TERM GOALS: Target date: 10 sessions  UPDATED - 06/24/2022  UPDATED - 07/19/2022  UPDATED - 08/20/2022  UPDATED - 09/13/2022 UPDATED 10/09/2022 Given moderate assistance, pt will answer simple basic yes/no questions with 50% accuracy.  Baseline: 25% Goal status: MET UPDATED goal: Given minimal assistance, pt will answer simple basic yes/no questions with 75% accuracy.  Goal status: MET   2.  Given moderate assistance, pt will follow simple basic 1 step directions with 50% accuracy.  Baseline: 0% Goal status: MET  UPDATED goal: Given minimal assistance, pt will following simple basic 1 step directions with 75% accuracy.    Goal status: MET   3.  With maximal multimodal cues, pt will name set of 10 common objects in 8 out of 10 opportunities.  Baseline: unable Goal status: MET UPDATED goal: With moderate cues, pt will name set of 10 common objects in 8 out of 10 opportunities.  Goal status: MET UPDATED goal: With minimal cues, pt will name set of 10 common objects in 8 out of 10 opportunities.  Goal status: ongoing  4. Pt will improve reading abilities by placing phrase level object function with object in field of 3 with 50% ability and moderate cues.              Baseline: object name to object - 90%             Goal status: MET  UPDATED goal: Pt will demonstrate reading comprehension at the sentence level for basic information with > 75% accuracy and min A cues.   Goal status: ONGOING  Goal status: ONGOING  UPDATED GOAL: 09/13/2022  Pt will demonstrate reading comprehension at the sentence level for  ADLs/iADLS with [id number] accuracy given min A cues.    5. Pt will improve spelling abilities by completing common words by filling in missing letter with 50% ability and moderate cues.              Baseline: unable             Goal status: MET UPDATED goal: Pt will improve spelling abilities by completing common words by filling in missing letters with >90% accuracy and rare min A cues.  Goal status: MET UPDATED goal: Pt will improve spelling ability by writing names of basic objects with supervision level cues in 8 out of 10 opportunities.  Goal status: ONGOING UPDATED GOAL: 09/13/2022 Pt will improve spelling ability by accurately writing sentence level description of his ADLs/iADLs with > 75% accuracy with Min A cues.    6. Pt will repeat rote phrases with 25% speech intelligibility given max to moderate cues.              Baseline: unable             Goal status: MET  UPDATED goal: Pt will repeat rote phrases with ~ 75% speech intelligibility given minimal cues.   Goal status: ONGOING  Goal status:  ONGOING  Goal status: MET  UPDATED GOAL: 09/13/2022  Pt will read sentences related to his ADLs/iADLs with > 75% speech intelligibility and moderate assistance.     LONG TERM GOALS: Target date: 10/25/2022   Pt will use multimodal communication to express basic wants/needs in 4 of 10 opportunities.  Baseline: unable Goal status: ONGOING   2.  Pt will demonstrate increased reading comprehension by selecting accurate answer from 3 choices in 2 out of 10 opportunities.  Baseline: unable Goal status: ONGOING   3.  Pt will correctly spell his name in 5 out of 10 opportunities.  Baseline: unable Goal status: MET   ASSESSMENT:  CLINICAL IMPRESSION: Pt continues to present with improving moderate expressive communication impairment and moderate apraxia of speech.   OBJECTIVE IMPAIRMENTS include expressive language, receptive language, aphasia, and apraxia. These impairments are limiting patient from managing medications, managing appointments, managing finances, household responsibilities, ADLs/IADLs, and effectively communicating at home and in community. Factors affecting potential to achieve goals and functional outcome are severity of impairments. Patient will benefit from skilled SLP services to address above impairments and improve overall function.  REHAB POTENTIAL: Excellent  PLAN: SLP FREQUENCY: 3x/week  SLP DURATION: 12 weeks  PLANNED INTERVENTIONS: Language facilitation, Environmental controls, Internal/external aids, Functional tasks, Multimodal communication approach, SLP instruction and feedback, Compensatory strategies, and Patient/family education     Antanasia Kaczynski B. Rutherford Nail, M.S., CCC-SLP, Mining engineer Certified Brain Injury [place]  [place] Office [phone] Ascom [phone] Fax [phone]

## 2022-10-24 ENCOUNTER — Ambulatory Visit: Payer: Medicare HMO

## 2022-10-24 DIAGNOSIS — I63412 Cerebral infarction due to embolism of left middle cerebral artery: Secondary | ICD-10-CM | POA: Diagnosis not present

## 2022-10-24 LAB — CUP PACEART REMOTE DEVICE CHECK
Date Time Interrogation Session: 20240307063014
Implantable Pulse Generator Implant Date: 20231005
Pulse Gen Model: 450218
Pulse Gen Serial Number: 95054077

## 2022-10-25 ENCOUNTER — Ambulatory Visit: Payer: Medicare HMO | Admitting: Speech Pathology

## 2022-10-28 ENCOUNTER — Ambulatory Visit: Payer: Medicare HMO | Admitting: Speech Pathology

## 2022-10-28 DIAGNOSIS — R482 Apraxia: Secondary | ICD-10-CM | POA: Diagnosis not present

## 2022-10-28 DIAGNOSIS — I6602 Occlusion and stenosis of left middle cerebral artery: Secondary | ICD-10-CM

## 2022-10-28 DIAGNOSIS — R4701 Aphasia: Secondary | ICD-10-CM

## 2022-10-28 NOTE — Therapy (Unsigned)
OUTPATIENT SPEECH LANGUAGE PATHOLOGY TREATMENT NOTE RE-CERTIFICATION REQUEST     Patient Name: Alan Stewart MRN: [id number] DOB:May 31, 1946, 77 y.o., male Today's Date: 10/28/2022   PCP: Ramonita Lab, MD REFERRING PROVIDER: Ramonita Lab, MD   END OF SESSION   End of Session - 10/28/22 0924     Visit Number 63    Number of Visits 29    Date for SLP Re-Evaluation 01/20/23    Authorization Type [employer] H[employer]    Authorization Time Period 08/26/2022 thru 11/22/2022    Authorization - Visit Number 25    Authorization - Number of Visits 36    Progress Note Due on Visit 4    SLP Start Time 0900    SLP Stop Time  1000    SLP Time Calculation (min) 60 min    Activity Tolerance Patient tolerated treatment well             Past Medical History:  Diagnosis Date   CAD (coronary artery disease)    a. 10/2021 NSTEMI/PCI: LM nl, LAD min irregs, LCX 16m(2.5x18 Onyx Frontier DES), OM1 nl, RCA 971m3.5x26 Onyx Frontier DES), 30d.   Diastolic dysfunction    a. 10/2021 Echo: EF 50-55%, no rwma, Gr1 DD, nl RV fxn. No significant valvular dzs.   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 SCCis. MOHs 07/08/22   Past Surgical History:  Procedure Laterality Date   COLONOSCOPY N/A 10/09/2015   Procedure: COLONOSCOPY;  Surgeon: PaHulen LusterMD;  [facility];  Service: Gastroenterology;  Laterality: N/A;   CORONARY STENT INTERVENTION N/A 10/22/2021   Procedure: CORONARY STENT INTERVENTION;  Surgeon: ArWellington HampshireMD;  [place];  Service: Cardiovascular;  Laterality: N/A;   IR CT HEAD LTD  05/21/2022   IR PERCUTANEOUS ART THROMBECTOMY/INFUSION INTRACRANIAL INC DIAG ANGIO  05/21/2022   LEFT HEART CATH AND CORONARY ANGIOGRAPHY N/A 10/22/2021    Procedure: LEFT HEART CATH AND CORONARY ANGIOGRAPHY;  Surgeon: ArWellington HampshireMD;  [place] [facility];  Service: Cardiovascular;  Laterality: N/A;   LOOP RECORDER INSERTION N/A 05/23/2022   Procedure: LOOP RECORDER INSERTION;  Surgeon: LaVickie EpleyMD;  [place] [facility];  Service: Cardiovascular;  Laterality: N/A;   RADIOLOGY WITH ANESTHESIA N/A 05/20/2022   Procedure: IR WITH ANESTHESIA;  Surgeon: DeLuanne BrasMD;  [place]: [facility] Service: Radiology;  Laterality: N/A;   ROTATOR CUFF REPAIR     Patient Active Problem List   Diagnosis Date Noted   Stroke (cerebrum) (HCCharl[place]10/10/2021   Middle cerebral artery embolism, 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 male with PMH: HTN, CAD, HLD, osteoarthritis, SCC, sleep apnea. He presented to the hospital on 05/20/22 with right sided weakness and aphasia with LKW reported by wife to be 10pm on 10/2.  He  arrived at [facility] as code stroke and taken for emergent CT/CTA which demonstrated left M1 occlusion. He received IV tenecteplase and IR thrombectomy due to large vessel occlusion.       DIAGNOSTIC FINDINGS:  MRI 05/22/2022 Cortical ischemia throughout the left MCA territory compatible with recent infarct. Relatively little affect on the white matter. 2. Small focus of acute hemorrhage along the anterior surface of the left temporal lobe Heidelberg classification 3c: Subarachnoid hemorrhage. 3. Normal intracranial MRA.  Restored flow in left MCA.    THERAPY DIAG:  Aphasia  Apraxia  Middle cerebral artery embolism,  left  Rationale for Evaluation and Treatment Rehabilitation  SUBJECTIVE: Pt brought in his homework, very good job completing Pt accompanied by: self and significant other  PAIN:  Are you having pain? No  PATIENT GOALS: to be able to communicate again  OBJECTIVE:   TODAY'S TREATMENT: Skilled treatment session focused on pt's communication goals. SLP facilitated session by providing the following interventions:  Vocabulary building  Divergent naming of basic categories - his wife reports Mod I completion with 100% accuracy when reviewed in session  A Workbook for Aphasia by [employer] utilized  Divergent naming: Categories Level: Easy p 10 with moderate to minimal assistance pt able to list 6 items for each of the following categories with rare min A for spelling  Emotions  Types of balls Breakfast foods  Homonyms Level: Easy p 16 with moderate to min assistance pt able to differentiate between two homonyms when given a sentence with fill in the blanks, pt completed activity with 90% accuracy.  Level: Medium p 17 with moderate to max assistance pt able to differentiate between two homonyms when given a sentence with fill in the blanks, pt completed activity with 50% accuracy.   Compound Words Level: Easy p 21 with max to moderate assistance pt able to match word parts to form compound words    PATIENT EDUCATION: Education details: see above Person educated: Patient and Wife Education method: Explanation, Demonstration, Verbal cues, and Handouts Education comprehension: verbalized understanding  HOME EXERCISE PROGRAM:  Complete calendar  GOALS: Goals reviewed with patient? Yes  SHORT TERM GOALS: Target date: 10 sessions  UPDATED - 06/24/2022  UPDATED - 07/19/2022  UPDATED - 08/20/2022  UPDATED - 09/13/2022 UPDATED 10/09/2022 UPDATED 10/28/22 Given moderate assistance, pt will answer simple basic yes/no questions with 50% accuracy.  Baseline: 25% Goal  status: MET UPDATED goal: Given minimal assistance, pt will answer simple basic yes/no questions with 75% accuracy.  Goal status: MET   2.  Given moderate assistance, pt will follow simple basic 1 step directions with 50% accuracy.  Baseline: 0% Goal status: MET  UPDATED goal: Given minimal assistance, pt will following simple basic 1 step directions with 75% accuracy.   Goal status: MET   3.  With maximal multimodal cues, pt will name set of 10 common objects in 8 out of 10 opportunities.  Baseline: unable Goal status: MET UPDATED goal: With moderate cues, pt will name set of 10 common objects in 8 out of 10 opportunities.  Goal status: MET UPDATED goal: With minimal cues, pt will name set of 10 common objects in 8 out of 10 opportunities.  Goal status: ongoing  4. Pt will improve reading abilities by placing phrase level object function with object in field of 3 with 50% ability and moderate cues.              Baseline: object name to object - 90%  Goal status: MET  UPDATED goal: Pt will demonstrate reading comprehension at the sentence level for basic information with > 75% accuracy and min A cues.   Goal status: ONGOING  Goal status: ONGOING  UPDATED GOAL: 09/13/2022  Pt will demonstrate reading comprehension at the sentence level for ADLs/iADLS with [id number] accuracy given min A cues.    Goal Status: MET  5. Pt will improve spelling abilities by completing common words by filling in missing letter with 50% ability and moderate cues.              Baseline: unable             Goal status: MET UPDATED goal: Pt will improve spelling abilities by completing common words by filling in missing letters with >90% accuracy and rare min A cues.  Goal status: MET UPDATED goal: Pt will improve spelling ability by writing names of basic objects with supervision level cues in 8 out of 10 opportunities.  Goal status: ONGOING UPDATED GOAL: 09/13/2022 Pt will improve spelling ability by  accurately writing sentence level description of his ADLs/iADLs with > 75% accuracy with Mod A cues.    6. Pt will repeat rote phrases with 25% speech intelligibility given max to moderate cues.              Baseline: unable             Goal status: MET  UPDATED goal: Pt will repeat rote phrases with ~ 75% speech intelligibility given minimal cues.   Goal status: ONGOING  Goal status: ONGOING  Goal status: MET  UPDATED GOAL: 09/13/2022  Pt will read sentences related to his ADLs/iADLs with > 75% speech intelligibility and moderate assistance.  7. Pt will recognize and name lowercase letters in written text with >50% accuracy given maximal cues.  Goal Status: Initial    LONG TERM GOALS: Target date: 01/20/2023   Pt will use multimodal communication to express basic wants/needs in 4 of 10 opportunities.  Baseline: unable Goal status: ONGOING   2.  Pt will demonstrate increased reading comprehension by selecting accurate answer from 3 choices in 2 out of 10 opportunities.  Baseline: unable Goal status: ONGOING  Goal Status: MET  Updated Goal: Pt will demonstrate increased reading comprehension by selecting accurate answer from 3 choices in 8 out of 10 opportunities. 3.  Pt will correctly spell his name in 5 out of 10 opportunities.  Baseline: unable Goal status: MET   ASSESSMENT:  CLINICAL IMPRESSION: Pt continues to present with improving moderate expressive communication impairment and moderate apraxia of speech as such he has met several STG and LTGs. Will request re-certification for continued ST services.   OBJECTIVE IMPAIRMENTS include expressive language, receptive language, aphasia, and apraxia. These impairments are limiting patient from managing medications, managing appointments, managing finances, household responsibilities, ADLs/IADLs, and effectively communicating at home and in community. Factors affecting potential to achieve goals and functional outcome are severity  of impairments. Patient will benefit from skilled SLP services to address above impairments and improve overall function.  REHAB POTENTIAL: Excellent  PLAN: SLP FREQUENCY: 3x/week  SLP DURATION: 12 weeks  PLANNED INTERVENTIONS: Language facilitation, Environmental controls, Internal/external aids, Functional tasks, Multimodal communication approach, SLP instruction and feedback, Compensatory strategies, and Patient/family education     Mahli Glahn B. Rutherford Nail, M.S., CCC-SLP, Mining engineer Certified Brain Injury [place]  [place] Office [phone] Ascom [phone] Fax [phone]

## 2022-10-30 ENCOUNTER — Ambulatory Visit: Payer: Medicare HMO | Admitting: Speech Pathology

## 2022-10-30 DIAGNOSIS — I6602 Occlusion and stenosis of left middle cerebral artery: Secondary | ICD-10-CM

## 2022-10-30 DIAGNOSIS — R4701 Aphasia: Secondary | ICD-10-CM

## 2022-10-30 DIAGNOSIS — R482 Apraxia: Secondary | ICD-10-CM | POA: Diagnosis not present

## 2022-10-30 NOTE — Therapy (Signed)
OUTPATIENT SPEECH LANGUAGE PATHOLOGY TREATMENT NOTE      Patient Name: Alan Stewart MRN: [id number] DOB:Aug 26, 1945, 77 y.o., male Today's Date: 10/30/2022   PCP: Ramonita Lab, MD REFERRING PROVIDER: Ramonita Lab, MD   END OF SESSION   End of Session - 10/30/22 1330     Visit Number 46    Number of Visits 58    Date for SLP Re-Evaluation 01/20/23    Authorization Type [employer] HMO    Authorization Time Period 08/26/2022 thru 11/22/2022    Authorization - Visit Number 19    Authorization - Number of Visits 36    Progress Note Due on Visit 32    SLP Start Time 1000    SLP Stop Time  1100    SLP Time Calculation (min) 60 min    Activity Tolerance Patient tolerated treatment well             Past Medical History:  Diagnosis Date   CAD (coronary artery disease)    a. 10/2021 NSTEMI/PCI: LM nl, LAD min irregs, LCX 50m(2.5x18 Onyx Frontier DES), OM1 nl, RCA 952m3.5x26 Onyx Frontier DES), 30d.   Diastolic dysfunction    a. 10/2021 Echo: EF 50-55%, no rwma, Gr1 DD, nl RV fxn. No significant valvular dzs.   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 SCCis. MOHs 07/08/22   Past Surgical History:  Procedure Laterality Date   COLONOSCOPY N/A 10/09/2015   Procedure: COLONOSCOPY;  Surgeon: PaHulen LusterMD;  [facility];  Service: Gastroenterology;  Laterality: N/A;   CORONARY STENT INTERVENTION N/A 10/22/2021   Procedure: CORONARY STENT INTERVENTION;  Surgeon: ArWellington HampshireMD;  [place] [facility];  Service: Cardiovascular;  Laterality: N/A;   IR CT HEAD LTD  05/21/2022   IR PERCUTANEOUS ART THROMBECTOMY/INFUSION INTRACRANIAL INC DIAG ANGIO  05/21/2022   LEFT HEART CATH AND CORONARY ANGIOGRAPHY N/A 10/22/2021   Procedure: LEFT HEART CATH  AND CORONARY ANGIOGRAPHY;  Surgeon: ArWellington HampshireMD;  [place];  Service: Cardiovascular;  Laterality: N/A;   LOOP RECORDER INSERTION N/A 05/23/2022   Procedure: LOOP RECORDER INSERTION;  Surgeon: LaVickie EpleyMD;  [facility];  Service: Cardiovascular;  Laterality: N/A;   RADIOLOGY WITH ANESTHESIA N/A 05/20/2022   Procedure: IR WITH ANESTHESIA;  Surgeon: DeLuanne BrasMD;  [place] Service: Radiology;  Laterality: N/A;   ROTATOR CUFF REPAIR     Patient Active Problem List   Diagnosis Date Noted   Stroke (cerebrum) (HCBairoa La Veint[place]10/10/2021   Middle cerebral artery embolism, 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 male with PMH: HTN, CAD, HLD, osteoarthritis, SCC, sleep apnea. He presented to the hospital on 05/20/22 with right sided weakness and aphasia with LKW reported by wife to be 10pm on 10/2.  He arrived  at [facility] as code stroke and taken for emergent CT/CTA which demonstrated left M1 occlusion. He received IV tenecteplase and IR thrombectomy due to large vessel occlusion.       DIAGNOSTIC FINDINGS:  MRI 05/22/2022 Cortical ischemia throughout the left MCA territory compatible with recent infarct. Relatively little affect on the white matter. 2. Small focus of acute hemorrhage along the anterior surface of the left temporal lobe Heidelberg classification 3c: Subarachnoid hemorrhage. 3. Normal intracranial MRA.  Restored flow in left MCA.    THERAPY DIAG:  Aphasia  Middle cerebral artery embolism, left  Rationale for Evaluation and  Treatment Rehabilitation  SUBJECTIVE: Pt appeared in a good mood, wife reports pt doing well with practicing notecard scripts at home.  Pt accompanied by: self and significant other  PAIN:  Are you having pain? No  PATIENT GOALS: to be able to communicate again  OBJECTIVE:   TODAY'S TREATMENT: Skilled treatment session focused on pt's communication goals. SLP facilitated session by providing the following interventions:  SLP reviewed goals met by client over the previous sessions, discussing improvements pt has made and creating a plan for continued growth during future sessions.   SLP utilized phonological treatment for writing to target letter/sound discrimination. Pt able to match letters with their sounds with >50 accuracy when given moderate assist. Pt able to identify 6/6 words when given a beginning sound with min assist.   During a structured activity utilizing playing cards, pt required moderate cues to count items on card for correct naming on numbers. Pt demonstrated a lack of awareness when asked to count, saying aloud incorrect numbers repeatedly, requiring SLP assistance in order to facilitate the correct naming of the number.    PATIENT EDUCATION: Education details: see above Person educated: Patient and Wife Education method: Explanation, Media planner, Verbal cues, and Handouts Education comprehension: verbalized understanding  HOME EXERCISE PROGRAM:  Complete calendar  GOALS: Goals reviewed with patient? Yes  SHORT TERM GOALS: Target date: 10 sessions  UPDATED - 06/24/2022  UPDATED - 07/19/2022  UPDATED - 08/20/2022  UPDATED - 09/13/2022 UPDATED 10/09/2022 UPDATED 10/28/22 Given moderate assistance, pt will answer simple basic yes/no questions with 50% accuracy.  Baseline: 25% Goal status: MET UPDATED goal: Given minimal assistance, pt will answer simple basic yes/no questions with 75% accuracy.  Goal status: MET   2.  Given moderate assistance, pt will  follow simple basic 1 step directions with 50% accuracy.  Baseline: 0% Goal status: MET  UPDATED goal: Given minimal assistance, pt will following simple basic 1 step directions with 75% accuracy.   Goal status: MET   3.  With maximal multimodal cues, pt will name set of 10 common objects in 8 out of 10 opportunities.  Baseline: unable Goal status: MET UPDATED goal: With moderate cues, pt will name set of 10 common objects in 8 out of 10 opportunities.  Goal status: MET UPDATED goal: With minimal cues, pt will name set of 10 common objects in 8 out of 10 opportunities.  Goal status: ongoing  4. Pt will improve reading abilities by placing phrase level object function with object in field of 3 with 50% ability and moderate cues.              Baseline: object name to object - 90%             Goal status: MET  UPDATED goal: Pt will demonstrate reading comprehension at the sentence level for basic information with > 75% accuracy and min  A cues.   Goal status: ONGOING  Goal status: ONGOING  UPDATED GOAL: 09/13/2022  Pt will demonstrate reading comprehension at the sentence level for ADLs/iADLS with [id number] accuracy given min A cues.    Goal Status: MET  5. Pt will improve spelling abilities by completing common words by filling in missing letter with 50% ability and moderate cues.              Baseline: unable             Goal status: MET UPDATED goal: Pt will improve spelling abilities by completing common words by filling in missing letters with >90% accuracy and rare min A cues.  Goal status: MET UPDATED goal: Pt will improve spelling ability by writing names of basic objects with supervision level cues in 8 out of 10 opportunities.  Goal status: ONGOING UPDATED GOAL: 09/13/2022 Pt will improve spelling ability by accurately writing sentence level description of his ADLs/iADLs with > 75% accuracy with Mod A cues.    6. Pt will repeat rote phrases with 25% speech intelligibility given max  to moderate cues.              Baseline: unable             Goal status: MET  UPDATED goal: Pt will repeat rote phrases with ~ 75% speech intelligibility given minimal cues.   Goal status: ONGOING  Goal status: ONGOING  Goal status: MET  UPDATED GOAL: 09/13/2022  Pt will read sentences related to his ADLs/iADLs with > 75% speech intelligibility and moderate assistance.  7. Pt will recognize and name lowercase letters in written text with >50% accuracy given maximal cues.  Goal Status: Initial    LONG TERM GOALS: Target date: 01/20/2023   Pt will use multimodal communication to express basic wants/needs in 4 of 10 opportunities.  Baseline: unable Goal status: ONGOING   2.  Pt will demonstrate increased reading comprehension by selecting accurate answer from 3 choices in 2 out of 10 opportunities.  Baseline: unable Goal status: ONGOING  Goal Status: MET  Updated Goal: Pt will demonstrate increased reading comprehension by selecting accurate answer from 3 choices in 8 out of 10 opportunities. 3.  Pt will correctly spell his name in 5 out of 10 opportunities.  Baseline: unable Goal status: MET   ASSESSMENT:  CLINICAL IMPRESSION: Pt continues to present with improving moderate expressive communication impairment and moderate apraxia of speech. Pt demonstrates progress in areas of automatic word and phrase production (mowing, doing a good job). 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   OBJECTIVE IMPAIRMENTS include expressive language, receptive language, aphasia, and apraxia. These impairments are limiting patient from managing medications, managing appointments, managing finances, household responsibilities, ADLs/IADLs, and effectively communicating at home and in community. Factors affecting potential to achieve goals and functional outcome are severity of impairments. Patient will benefit from skilled SLP services to address above impairments and  improve overall function.  REHAB POTENTIAL: Excellent  PLAN: SLP FREQUENCY: 3x/week  SLP DURATION: 12 weeks  PLANNED INTERVENTIONS: Language facilitation, Environmental controls, Internal/external aids, Functional tasks, Multimodal communication approach, SLP instruction and feedback, Compensatory strategies, and Patient/family education  Alphonzo Grieve, SLP Graduate Clinician    Happi B. Rutherford Nail, M.S., CCC-SLP, Mining engineer Certified Brain Injury [place]  [place] Office [phone] Ascom [phone] Fax [phone]

## 2022-11-01 ENCOUNTER — Ambulatory Visit: Payer: Medicare HMO | Admitting: Speech Pathology

## 2022-11-01 DIAGNOSIS — R4701 Aphasia: Secondary | ICD-10-CM

## 2022-11-01 DIAGNOSIS — I6602 Occlusion and stenosis of left middle cerebral artery: Secondary | ICD-10-CM | POA: Diagnosis not present

## 2022-11-01 DIAGNOSIS — R482 Apraxia: Secondary | ICD-10-CM | POA: Diagnosis not present

## 2022-11-01 NOTE — Therapy (Signed)
OUTPATIENT SPEECH LANGUAGE PATHOLOGY TREATMENT NOTE      Patient Name: Alan Stewart MRN: [id number] DOB:07-Feb-1946, 77 y.o., male Today's Date: 11/01/2022   PCP: Ramonita Lab, MD REFERRING PROVIDER: Ramonita Lab, MD   END OF SESSION   End of Session - 11/01/22 1202     Visit Number 60    Number of Visits 85    Date for SLP Re-Evaluation 01/20/23    Authorization Type [employer] H[employer]    Authorization Time Period 08/26/2022 thru 11/22/2022    Authorization - Visit Number [id number]    Authorization - Number of Visits 36    Progress Note Due on Visit 66    SLP Start Time 1000    SLP Stop Time  1100    SLP Time Calculation (min) 60 min    Activity Tolerance Patient tolerated treatment well             Past Medical History:  Diagnosis Date   CAD (coronary artery disease)    a. 10/2021 NSTEMI/PCI: LM nl, LAD min irregs, LCX 54m (2.5x18 Onyx Frontier DES), OM1 nl, RCA 44m (3.5x26 Onyx Frontier DES), 30d.   Diastolic dysfunction    a. 10/2021 Echo: EF 50-55%, no rwma, Gr1 DD, nl RV fxn. No significant valvular dzs.   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 SCCis. MOHs 07/08/22   Past Surgical History:  Procedure Laterality Date   COLONOSCOPY N/A 10/09/2015   Procedure: COLONOSCOPY;  Surgeon: Hulen Luster, MD;  Location: [facility] ENDOSCOPY;  Service: Gastroenterology;  Laterality: N/A;   CORONARY STENT INTERVENTION N/A 10/22/2021   Procedure: CORONARY STENT INTERVENTION;  Surgeon: Wellington Hampshire, MD;  Location: [place] CV LAB;  Service: Cardiovascular;  Laterality: N/A;   IR CT HEAD LTD  05/21/2022   IR PERCUTANEOUS ART THROMBECTOMY/INFUSION INTRACRANIAL INC DIAG ANGIO  05/21/2022   LEFT HEART CATH AND CORONARY ANGIOGRAPHY N/A 10/22/2021   Procedure: LEFT HEART CATH  AND CORONARY ANGIOGRAPHY;  Surgeon: Wellington Hampshire, MD;  Location: [place] CV LAB;  Service: Cardiovascular;  Laterality: N/A;   LOOP RECORDER INSERTION N/A 05/23/2022   Procedure: LOOP RECORDER INSERTION;  Surgeon: Vickie Epley, MD;  Location: [place] CV LAB;  Service: Cardiovascular;  Laterality: N/A;   RADIOLOGY WITH ANESTHESIA N/A 05/20/2022   Procedure: IR WITH ANESTHESIA;  Surgeon: Luanne Bras, MD;  Location: [place];  Service: Radiology;  Laterality: N/A;   ROTATOR CUFF REPAIR     Patient Active Problem List   Diagnosis Date Noted   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 male with PMH: HTN, CAD, HLD, osteoarthritis, SCC, sleep apnea. He presented to the hospital on 05/20/22 with right sided weakness and aphasia with LKW reported by wife to be 10pm on 10/2.  He arrived  at [facility] as code stroke and taken for emergent CT/CTA which demonstrated left M1 occlusion. He received IV tenecteplase and IR thrombectomy due to large vessel occlusion.       DIAGNOSTIC FINDINGS:  MRI 05/22/2022 Cortical ischemia throughout the left MCA territory compatible with recent infarct. Relatively little affect on the white matter. 2. Small focus of acute hemorrhage along the anterior surface of the left temporal lobe Heidelberg classification 3c: Subarachnoid hemorrhage. 3. Normal intracranial MRA.  Restored flow in left MCA.    THERAPY DIAG:  Aphasia  Rationale for Evaluation and Treatment Rehabilitation  SUBJECTIVE: Pt  appeared in a good mood, asked student clinician "where are you going" in reference to her weekend plans. Pt accompanied by: self and significant other  PAIN:  Are you having pain? No  PATIENT GOALS: to be able to communicate again  OBJECTIVE:   TODAY'S TREATMENT: Skilled treatment session focused on pt's communication goals. SLP facilitated session by providing the following interventions: Pt completed compound word matching with a ratio of 3 to 1 cards with 90% accuracy given moderate cues, showing improvement in reading of words when given a model. In a attempt to branch up, pt was given a full set of 20 compound words to match, pt was unable to complete with max assist. In order to make activity more functional, SLP created set of words seen in pts everyday life. Pt tasked to fill in the blank using a word bank. Pt completed with 67% accuracy given moderate assist.  SLP further facilitated demonstration of combining word parts to create whole words by tasking pt to combine word parts by writing the whole word and saying it aloud when given a word bank. Pt completed activity with 100% accuracy.    PATIENT EDUCATION: Education details: see above Person educated: Patient and Wife Education method: Explanation, Media planner, Verbal cues, and Handouts Education comprehension: verbalized understanding  HOME EXERCISE PROGRAM:  Complete calendar  GOALS: Goals reviewed with patient? Yes  SHORT TERM GOALS: Target date: 10 sessions  UPDATED - 06/24/2022  UPDATED - 07/19/2022  UPDATED - 08/20/2022  UPDATED - 09/13/2022 UPDATED 10/09/2022 UPDATED 10/28/22 Given moderate assistance, pt will answer simple basic yes/no questions with 50% accuracy.  Baseline: 25% Goal status: MET UPDATED goal: Given minimal assistance, pt will answer simple basic yes/no questions with 75% accuracy.  Goal status: MET   2.  Given moderate assistance, pt will follow simple basic 1 step directions with 50%  accuracy.  Baseline: 0% Goal status: MET  UPDATED goal: Given minimal assistance, pt will following simple basic 1 step directions with 75% accuracy.   Goal status: MET   3.  With maximal multimodal cues, pt will name set of 10 common objects in 8 out of 10 opportunities.  Baseline: unable Goal status: MET UPDATED goal: With moderate cues, pt will name set of 10 common objects in 8 out of 10 opportunities.  Goal status: MET UPDATED goal: With minimal cues, pt will name set of 10 common objects in 8 out of 10 opportunities.  Goal status: ongoing  4. Pt will improve reading abilities by placing phrase level object function with object in field of 3 with 50% ability and moderate cues.              Baseline: object name to object - 90%             Goal status: MET  UPDATED goal: Pt will demonstrate reading comprehension at the sentence level for  basic information with > 75% accuracy and min A cues.   Goal status: ONGOING  Goal status: ONGOING  UPDATED GOAL: 09/13/2022  Pt will demonstrate reading comprehension at the sentence level for ADLs/iADLS with [id number] accuracy given min A cues.    Goal Status: MET  5. Pt will improve spelling abilities by completing common words by filling in missing letter with 50% ability and moderate cues.              Baseline: unable             Goal status: MET UPDATED goal: Pt will improve spelling abilities by completing common words by filling in missing letters with >90% accuracy and rare min A cues.  Goal status: MET UPDATED goal: Pt will improve spelling ability by writing names of basic objects with supervision level cues in 8 out of 10 opportunities.  Goal status: ONGOING UPDATED GOAL: 09/13/2022 Pt will improve spelling ability by accurately writing sentence level description of his ADLs/iADLs with > 75% accuracy with Mod A cues.    6. Pt will repeat rote phrases with 25% speech intelligibility given max to moderate cues.              Baseline:  unable             Goal status: MET  UPDATED goal: Pt will repeat rote phrases with ~ 75% speech intelligibility given minimal cues.   Goal status: ONGOING  Goal status: ONGOING  Goal status: MET  UPDATED GOAL: 09/13/2022  Pt will read sentences related to his ADLs/iADLs with > 75% speech intelligibility and moderate assistance.  7. Pt will recognize and name lowercase letters in written text with >50% accuracy given maximal cues.  Goal Status: Initial    LONG TERM GOALS: Target date: 01/20/2023   Pt will use multimodal communication to express basic wants/needs in 4 of 10 opportunities.  Baseline: unable Goal status: ONGOING   2.  Pt will demonstrate increased reading comprehension by selecting accurate answer from 3 choices in 2 out of 10 opportunities.  Baseline: unable Goal status: ONGOING  Goal Status: MET  Updated Goal: Pt will demonstrate increased reading comprehension by selecting accurate answer from 3 choices in 8 out of 10 opportunities. 3.  Pt will correctly spell his name in 5 out of 10 opportunities.  Baseline: unable Goal status: MET   ASSESSMENT:  CLINICAL IMPRESSION: Pt continues to present with improving moderate expressive communication impairment and moderate apraxia of speech. Pt demonstrates progress in areas word production, need minimal models. Pt demonstrations good use of strategies (gestures, writing). Pt continues to work toward goals of vocabulary building.   OBJECTIVE IMPAIRMENTS include expressive language, receptive language, aphasia, and apraxia. These impairments are limiting patient from managing medications, managing appointments, managing finances, household responsibilities, ADLs/IADLs, and effectively communicating at home and in community. Factors affecting potential to achieve goals and functional outcome are severity of impairments. Patient will benefit from skilled SLP services to address above impairments and improve overall  function.  REHAB POTENTIAL: Excellent  PLAN: SLP FREQUENCY: 3x/week  SLP DURATION: 12 weeks  PLANNED INTERVENTIONS: Language facilitation, Environmental controls, Internal/external aids, Functional tasks, Multimodal communication approach, SLP instruction and feedback, Compensatory strategies, and Patient/family education  Alphonzo Grieve, SLP Graduate Clinician    Happi B. Rutherford Nail, M.S., CCC-SLP, Mining engineer Certified Brain Injury [place]  [place] Office [phone] Ascom [phone] Fax [phone]

## 2022-11-04 ENCOUNTER — Ambulatory Visit: Payer: Medicare HMO | Admitting: Speech Pathology

## 2022-11-04 DIAGNOSIS — R4701 Aphasia: Secondary | ICD-10-CM

## 2022-11-04 DIAGNOSIS — I6602 Occlusion and stenosis of left middle cerebral artery: Secondary | ICD-10-CM | POA: Diagnosis not present

## 2022-11-04 DIAGNOSIS — R482 Apraxia: Secondary | ICD-10-CM | POA: Diagnosis not present

## 2022-11-04 NOTE — Therapy (Signed)
OUTPATIENT SPEECH LANGUAGE PATHOLOGY TREATMENT NOTE      Patient Name: Alan Stewart MRN: [id number] DOB:06-Apr-1946, 77 y.o., male Today's Date: 11/04/2022   PCP: Ramonita Lab, MD REFERRING PROVIDER: Ramonita Lab, MD   END OF SESSION   End of Session - 11/04/22 1404     Visit Number 10    Number of Visits 14    Date for SLP Re-Evaluation 01/20/23    Authorization Type [employer] HMO    Authorization Time Period 08/26/2022 thru 11/22/2022    Authorization - Visit Number 22    Authorization - Number of Visits 36    Progress Note Due on Visit 48    SLP Start Time 0900    SLP Stop Time  1000    SLP Time Calculation (min) 60 min    Activity Tolerance Patient tolerated treatment well             Past Medical History:  Diagnosis Date   CAD (coronary artery disease)    a. 10/2021 NSTEMI/PCI: LM nl, LAD min irregs, LCX 51m (2.5x18 Onyx Frontier DES), OM1 nl, RCA 65m (3.5x26 Onyx Frontier DES), 30d.   Diastolic dysfunction    a. 10/2021 Echo: EF 50-55%, no rwma, Gr1 DD, nl RV fxn. No significant valvular dzs.   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 SCCis. MOHs 07/08/22   Past Surgical History:  Procedure Laterality Date   COLONOSCOPY N/A 10/09/2015   Procedure: COLONOSCOPY;  Surgeon: Hulen Luster, MD;  Location: [facility] ENDOSCOPY;  Service: Gastroenterology;  Laterality: N/A;   CORONARY STENT INTERVENTION N/A 10/22/2021   Procedure: CORONARY STENT INTERVENTION;  Surgeon: Wellington Hampshire, MD;  Location: [place] [facility] LAB;  Service: Cardiovascular;  Laterality: N/A;   IR CT HEAD LTD  05/21/2022   IR PERCUTANEOUS ART THROMBECTOMY/INFUSION INTRACRANIAL INC DIAG ANGIO  05/21/2022   LEFT HEART CATH AND CORONARY ANGIOGRAPHY N/A 10/22/2021   Procedure: LEFT HEART CATH  AND CORONARY ANGIOGRAPHY;  Surgeon: Wellington Hampshire, MD;  Location: [place] [facility] LAB;  Service: Cardiovascular;  Laterality: N/A;   LOOP RECORDER INSERTION N/A 05/23/2022   Procedure: LOOP RECORDER INSERTION;  Surgeon: Vickie Epley, MD;  Location: [place] [facility] LAB;  Service: Cardiovascular;  Laterality: N/A;   RADIOLOGY WITH ANESTHESIA N/A 05/20/2022   Procedure: IR WITH ANESTHESIA;  Surgeon: Luanne Bras, MD;  Location: [place];  Service: Radiology;  Laterality: N/A;   ROTATOR CUFF REPAIR     Patient Active Problem List   Diagnosis Date Noted   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 male with PMH: HTN, CAD, HLD, osteoarthritis, SCC, sleep apnea. He presented to the hospital on 05/20/22 with right sided weakness and aphasia with LKW reported by wife to be 10pm on 10/2.  He arrived  at [facility] as code stroke and taken for emergent CT/CTA which demonstrated left M1 occlusion. He received IV tenecteplase and IR thrombectomy due to large vessel occlusion.       DIAGNOSTIC FINDINGS:  MRI 05/22/2022 Cortical ischemia throughout the left MCA territory compatible with recent infarct. Relatively little affect on the white matter. 2. Small focus of acute hemorrhage along the anterior surface of the left temporal lobe Heidelberg classification 3c: Subarachnoid hemorrhage. 3. Normal intracranial MRA.  Restored flow in left MCA.    THERAPY DIAG:  Aphasia  Rationale for Evaluation and Treatment Rehabilitation  SUBJECTIVE: Pt  appeared in a good mood, wife reports he has been working out in the yard more. Pt accompanied by: self and significant other  PAIN:  Are you having pain? No  PATIENT GOALS: to be able to communicate again  OBJECTIVE:   TODAY'S TREATMENT: Skilled treatment session focused on pt's communication goals. SLP facilitated session by providing the following interventions:  SLP facilitated letter/sound discrimination by use of phonological treatment for writing. When given moderate assistance, pt able to identify 3/3 words each for the following letters based on initial sounds: /b/, /p/, /g/, /s/. When tasked to identify letters when given an initial sound pt able to identify correct letter/sound correspondence with >50% accuracy.  Pt initially had difficulty discriminating difference between /p/ and /b/ as articulators are the same so SLP switched /p/ for /g/ to allow for better differentiation in sounds.  [place]  Sentence Completion  Level Easy Pg 27: 90%  Level Hard pg 28: 80%  Morphology Pg 29: 70% with mod assist      PATIENT EDUCATION: Education details: see above Person educated: Patient and Wife Education method: Explanation, Media planner, Verbal cues, and Handouts Education comprehension: verbalized understanding  HOME EXERCISE PROGRAM:  Complete calendar  GOALS: Goals reviewed with patient? Yes  SHORT TERM GOALS: Target date: 10 sessions  UPDATED - 06/24/2022  UPDATED - 07/19/2022  UPDATED - 08/20/2022  UPDATED - 09/13/2022 UPDATED 10/09/2022 UPDATED 10/28/22 Given moderate assistance, pt will answer simple basic yes/no questions with 50% accuracy.  Baseline: 25% Goal status: MET UPDATED goal: Given minimal assistance, pt will answer simple basic yes/no questions with 75% accuracy.  Goal status: MET   2.  Given moderate assistance, pt will follow simple basic 1 step directions with 50% accuracy.  Baseline: 0% Goal status: MET  UPDATED goal: Given minimal  assistance, pt will following simple basic 1 step directions with 75% accuracy.   Goal status: MET   3.  With maximal multimodal cues, pt will name set of 10 common objects in 8 out of 10 opportunities.  Baseline: unable Goal status: MET UPDATED goal: With moderate cues, pt will name set of 10 common objects in 8 out of 10 opportunities.  Goal status: MET UPDATED goal: With minimal cues, pt will name set of 10 common objects in 8 out of 10 opportunities.  Goal status: ongoing  4. Pt will improve reading abilities by placing phrase level object function with object in field of 3 with 50% ability and moderate cues.              Baseline: object name to object - 90%             Goal status: MET  UPDATED goal: Pt will demonstrate reading comprehension at the sentence level for basic information with > 75% accuracy and min A cues.   Goal status: ONGOING  Goal status: ONGOING  UPDATED GOAL:  09/13/2022  Pt will demonstrate reading comprehension at the sentence level for ADLs/iADLS with [id number] accuracy given min A cues.    Goal Status: MET  5. Pt will improve spelling abilities by completing common words by filling in missing letter with 50% ability and moderate cues.              Baseline: unable             Goal status: MET UPDATED goal: Pt will improve spelling abilities by completing common words by filling in missing letters with >90% accuracy and rare min A cues.  Goal status: MET UPDATED goal: Pt will improve spelling ability by writing names of basic objects with supervision level cues in 8 out of 10 opportunities.  Goal status: ONGOING UPDATED GOAL: 09/13/2022 Pt will improve spelling ability by accurately writing sentence level description of his ADLs/iADLs with > 75% accuracy with Mod A cues.    6. Pt will repeat rote phrases with 25% speech intelligibility given max to moderate cues.              Baseline: unable             Goal status: MET  UPDATED goal: Pt will repeat rote  phrases with ~ 75% speech intelligibility given minimal cues.   Goal status: ONGOING  Goal status: ONGOING  Goal status: MET  UPDATED GOAL: 09/13/2022  Pt will read sentences related to his ADLs/iADLs with > 75% speech intelligibility and moderate assistance.  7. Pt will recognize and name lowercase letters in written text with >50% accuracy given maximal cues.  Goal Status: Initial    LONG TERM GOALS: Target date: 01/20/2023   Pt will use multimodal communication to express basic wants/needs in 4 of 10 opportunities.  Baseline: unable Goal status: ONGOING   2.  Pt will demonstrate increased reading comprehension by selecting accurate answer from 3 choices in 2 out of 10 opportunities.  Baseline: unable Goal status: ONGOING  Goal Status: MET  Updated Goal: Pt will demonstrate increased reading comprehension by selecting accurate answer from 3 choices in 8 out of 10 opportunities. 3.  Pt will correctly spell his name in 5 out of 10 opportunities.  Baseline: unable Goal status: MET   ASSESSMENT:  CLINICAL IMPRESSION: Pt continues to present with improving moderate expressive communication impairment and moderate apraxia of speech. Pt demonstrates progress in areas word production, need minimal models. Pt demonstrations good use of strategies (gestures, writing). Pt continues to work toward goals of vocabulary building.   OBJECTIVE IMPAIRMENTS include expressive language, receptive language, aphasia, and apraxia. These impairments are limiting patient from managing medications, managing appointments, managing finances, household responsibilities, ADLs/IADLs, and effectively communicating at home and in community. Factors affecting potential to achieve goals and functional outcome are severity of impairments. Patient will benefit from skilled SLP services to address above impairments and improve overall function.  REHAB POTENTIAL: Excellent  PLAN: SLP FREQUENCY: 3x/week  SLP  DURATION: 12 weeks  PLANNED INTERVENTIONS: Language facilitation, Environmental controls, Internal/external aids, Functional tasks, Multimodal communication approach, SLP instruction and feedback, Compensatory strategies, and Patient/family education  Alphonzo Grieve, SLP Graduate Clinician    Dorrie Cocuzza B. Rutherford Nail, M.S., CCC-SLP, Mining engineer Certified Brain Injury [place]  [place] Office [phone] Ascom [phone] Fax [phone]

## 2022-11-06 ENCOUNTER — Ambulatory Visit: Payer: Medicare HMO | Admitting: Speech Pathology

## 2022-11-06 NOTE — Progress Notes (Signed)
[facility] Summary Report / Loop Recorder 

## 2022-11-08 ENCOUNTER — Ambulatory Visit: Payer: Medicare HMO | Admitting: Speech Pathology

## 2022-11-11 ENCOUNTER — Ambulatory Visit: Payer: Medicare HMO | Admitting: Speech Pathology

## 2022-11-13 ENCOUNTER — Ambulatory Visit: Payer: Medicare HMO | Admitting: Speech Pathology

## 2022-11-15 ENCOUNTER — Ambulatory Visit: Payer: Medicare HMO | Admitting: Speech Pathology

## 2022-11-15 DIAGNOSIS — R4701 Aphasia: Secondary | ICD-10-CM

## 2022-11-15 DIAGNOSIS — R482 Apraxia: Secondary | ICD-10-CM | POA: Diagnosis not present

## 2022-11-15 DIAGNOSIS — I6602 Occlusion and stenosis of left middle cerebral artery: Secondary | ICD-10-CM | POA: Diagnosis not present

## 2022-11-15 NOTE — Therapy (Signed)
OUTPATIENT SPEECH LANGUAGE PATHOLOGY TREATMENT NOTE  10TH VISIT PROGRESS NOTE      Patient Name: GABRIEN Stewart MRN: [id number] DOB:September 05, 1945, 77 y.o., male Today's Date: 11/15/2022   PCP: Ramonita Lab, MD REFERRING PROVIDER: Ramonita Lab, MD  Speech Therapy Progress Note  Dates of Reporting Period: 10/28/2022 to 11/15/2022  Objective: Patient has been seen for 10 speech therapy sessions this reporting period targeting aphasia. Patient is making progress toward LTGs and STG. See skilled intervention, clinical impressions, and goals below for details.   END OF SESSION   End of Session - 11/15/22 1056     Visit Number 60    Number of Visits 35    Date for SLP Re-Evaluation 01/20/23    Authorization Type [employer] HMO    Authorization Time Period 08/26/2022 thru 11/22/2022    Authorization - Visit Number [id number]    Authorization - Number of Visits 36    Progress Note Due on Visit 35    SLP Start Time 1000    SLP Stop Time  1100    SLP Time Calculation (min) 60 min    Activity Tolerance Patient tolerated treatment well             Past Medical History:  Diagnosis Date   CAD (coronary artery disease)    a. 10/2021 NSTEMI/PCI: LM nl, LAD min irregs, LCX 14m (2.5x18 Onyx Frontier DES), OM1 nl, RCA 61m (3.5x26 Onyx Frontier DES), 30d.   Diastolic dysfunction    a. 10/2021 Echo: EF 50-55%, no rwma, Gr1 DD, nl RV fxn. No significant valvular dzs.   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 SCCis. MOHs 07/08/22   Past Surgical History:  Procedure Laterality Date   COLONOSCOPY N/A 10/09/2015   Procedure: COLONOSCOPY;  Surgeon: Hulen Luster, MD;  Location: [facility] ENDOSCOPY;  Service: Gastroenterology;  Laterality: N/A;   CORONARY STENT INTERVENTION N/A 10/22/2021    Procedure: CORONARY STENT INTERVENTION;  Surgeon: Wellington Hampshire, MD;  Location: [place] CV LAB;  Service: Cardiovascular;  Laterality: N/A;   IR CT HEAD LTD  05/21/2022   IR PERCUTANEOUS ART THROMBECTOMY/INFUSION INTRACRANIAL INC DIAG ANGIO  05/21/2022   LEFT HEART CATH AND CORONARY ANGIOGRAPHY N/A 10/22/2021   Procedure: LEFT HEART CATH AND CORONARY ANGIOGRAPHY;  Surgeon: Wellington Hampshire, MD;  Location: [place] [facility] LAB;  Service: Cardiovascular;  Laterality: N/A;   LOOP RECORDER INSERTION N/A 05/23/2022   Procedure: LOOP RECORDER INSERTION;  Surgeon: Vickie Epley, MD;  Location: [place] [facility] LAB;  Service: Cardiovascular;  Laterality: N/A;   RADIOLOGY WITH ANESTHESIA N/A 05/20/2022   Procedure: IR WITH ANESTHESIA;  Surgeon: Luanne Bras, MD;  Location: [place];  Service: Radiology;  Laterality: N/A;   ROTATOR CUFF REPAIR     Patient Active Problem List   Diagnosis Date Noted   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 male with PMH: HTN, CAD, HLD, osteoarthritis, SCC, sleep apnea. He presented to the hospital on 05/20/22 with right sided weakness and aphasia with LKW reported by wife to be 10pm on 10/2.  He arrived at [facility] as code stroke and taken for emergent CT/CTA which demonstrated left M1 occlusion. He received IV tenecteplase and IR thrombectomy due to large vessel occlusion.       DIAGNOSTIC FINDINGS:  MRI 05/22/2022 Cortical ischemia throughout the left MCA territory compatible with recent infarct.  Relatively little affect on the white matter. 2. Small focus of acute hemorrhage along the anterior surface of the left temporal lobe Heidelberg classification 3c: Subarachnoid hemorrhage. 3. Normal intracranial MRA.  Restored flow in left MCA.    THERAPY DIAG:  Aphasia  Rationale for Evaluation and Treatment Rehabilitation  SUBJECTIVE: Pt appeared in a good mood, wife reports he has been resistant toward writing things out at home when having word finding difficulty more recently.  Pt accompanied by: self and significant other  PAIN:  Are you having pain? No  PATIENT GOALS: to be able to communicate again  OBJECTIVE:   TODAY'S TREATMENT: Skilled treatment session focused on pt's communication goals. SLP facilitated session by providing the following interventions:  SLP facilitated letter/sound discrimination by use of phonological treatment for writing. SLP reviewed the following initial sounds: /b/, /p/, /g/, /s/. Pt benefitted from moderate assistance to identify letters when given initial sound with >50% accuracy.   When given moderate assistance, pt able to identify 3/3 words each for the following letters based on initial sounds: /m/, /k/, /k/, /v/, /r/. When tasked to identify letters when given an initial sound pt able to identify correct letter/sound correspondence with >50% accuracy, pt benefited from fill in the blank cues.  WAC  Categories:Object Naming   Activity: Name Objects found in living room: Pt benefited from mod to max semantic cues to complete activity with > 80% accuracy.      PATIENT EDUCATION: Education details: see above Person educated: Patient and Wife Education method: Explanation, Media planner, Verbal cues, and Handouts Education comprehension: verbalized understanding  HOME EXERCISE PROGRAM:  Complete calendar  GOALS: Goals reviewed with patient? Yes  SHORT TERM GOALS: Target date: 10 sessions  UPDATED - 06/24/2022  UPDATED -  07/19/2022  UPDATED - 08/20/2022  UPDATED - 09/13/2022 UPDATED 10/09/2022 UPDATED 10/28/22 UPDATED 11/15/22 Given moderate assistance, pt will answer simple basic yes/no questions with 50% accuracy.  Baseline: 25% Goal status: MET UPDATED goal: Given minimal assistance, pt will answer simple basic yes/no questions with 75% accuracy.  Goal status: MET   2.  Given moderate assistance, pt will follow simple basic 1 step directions with 50% accuracy.  Baseline: 0% Goal status: MET  UPDATED goal: Given minimal assistance, pt will following simple basic 1 step directions with 75% accuracy.   Goal status: MET   3.  With maximal multimodal cues, pt will name set of 10 common objects in 8 out of 10 opportunities.  Baseline: unable Goal status: MET UPDATED goal: With moderate cues, pt will name set of 10 common objects in 8 out of 10 opportunities.  Goal status: MET UPDATED goal: With minimal cues, pt will name set of 10 common objects in 8 out of 10 opportunities.  Goal status: ongoing Goal status: ongoing 4. Pt will improve reading abilities by placing phrase level object function with object in field of 3  with 50% ability and moderate cues.              Baseline: object name to object - 90%             Goal status: MET  UPDATED goal: Pt will demonstrate reading comprehension at the sentence level for basic information with > 75% accuracy and min A cues.   Goal status: ONGOING  Goal status: ONGOING  UPDATED GOAL: 09/13/2022  Pt will demonstrate reading comprehension at the sentence level for ADLs/iADLS with [id number] accuracy given min A cues.    Goal Status: MET  5. Pt will improve spelling abilities by completing common words by filling in missing letter with 50% ability and moderate cues.              Baseline: unable             Goal status: MET UPDATED goal: Pt will improve spelling abilities by completing common words by filling in missing letters with >90% accuracy and rare min A  cues.  Goal status: MET UPDATED goal: Pt will improve spelling ability by writing names of basic objects with supervision level cues in 8 out of 10 opportunities.  Goal status: ONGOING UPDATED GOAL: 09/13/2022 Pt will improve spelling ability by accurately writing sentence level description of his ADLs/iADLs with > 75% accuracy with Mod A cues.   Goal status: ongoing 6. Pt will repeat rote phrases with 25% speech intelligibility given max to moderate cues.              Baseline: unable             Goal status: MET  UPDATED goal: Pt will repeat rote phrases with ~ 75% speech intelligibility given minimal cues.   Goal status: ONGOING  Goal status: ONGOING  Goal status: MET  UPDATED GOAL: 09/13/2022  Pt will read sentences related to his ADLs/iADLs with > 75% speech intelligibility and moderate assistance.  7. Pt will recognize and name lowercase letters in written text with >50% accuracy given maximal cues.  Goal Status: Initial  Goal status: ongoing  LONG TERM GOALS: Target date: 01/20/2023   Pt will use multimodal communication to express basic wants/needs in 4 of 10 opportunities.  Baseline: unable Goal status: ONGOING  Goal status: ongoing 2.  Pt will demonstrate increased reading comprehension by selecting accurate answer from 3 choices in 2 out of 10 opportunities.  Baseline: unable Goal status: ONGOING  Goal Status: MET  Updated Goal: Pt will demonstrate increased reading comprehension by selecting accurate answer from 3 choices in 8 out of 10 opportunities. 3.  Pt will correctly spell his name in 5 out of 10 opportunities.  Baseline: unable Goal status: MET   ASSESSMENT:  CLINICAL IMPRESSION: Pt continues to present with improving moderate expressive communication impairment and moderate apraxia of speech. Pt demonstrates progress in areas of word finding, spelling requiring minimal models. Pt demonstrations good use of strategies (gestures, writing). Pt continues to work  toward goals of vocabulary building and phonemic awareness.   OBJECTIVE IMPAIRMENTS include expressive language, receptive language, aphasia, and apraxia. These impairments are limiting patient from managing medications, managing appointments, managing finances, household responsibilities, ADLs/IADLs, and effectively communicating at home and in community. Factors affecting potential to achieve goals and functional outcome are severity of impairments. Patient will benefit from skilled SLP services to address above impairments and improve overall function.  REHAB POTENTIAL: Excellent  PLAN: SLP FREQUENCY: 3x/week  SLP DURATION: 12 weeks  PLANNED  INTERVENTIONS: Language facilitation, Environmental controls, Internal/external aids, Functional tasks, Multimodal communication approach, SLP instruction and feedback, Compensatory strategies, and Patient/family education  Alphonzo Grieve, SLP Graduate Clinician    Happi B. Rutherford Nail, M.S., CCC-SLP, Mining engineer Certified Brain Injury [place] Office [phone] Ascom [phone] Fax [phone]

## 2022-11-18 ENCOUNTER — Ambulatory Visit: Payer: Medicare HMO | Admitting: Speech Pathology

## 2022-11-19 DIAGNOSIS — E1165 Type 2 diabetes mellitus with hyperglycemia: Secondary | ICD-10-CM | POA: Diagnosis not present

## 2022-11-19 DIAGNOSIS — E7849 Other hyperlipidemia: Secondary | ICD-10-CM | POA: Diagnosis not present

## 2022-11-19 DIAGNOSIS — D649 Anemia, unspecified: Secondary | ICD-10-CM | POA: Diagnosis not present

## 2022-11-19 DIAGNOSIS — E039 Hypothyroidism, unspecified: Secondary | ICD-10-CM | POA: Diagnosis not present

## 2022-11-20 ENCOUNTER — Ambulatory Visit: Payer: Medicare HMO | Attending: Internal Medicine | Admitting: Speech Pathology

## 2022-11-20 DIAGNOSIS — E1165 Type 2 diabetes mellitus with hyperglycemia: Secondary | ICD-10-CM | POA: Diagnosis not present

## 2022-11-20 DIAGNOSIS — E039 Hypothyroidism, unspecified: Secondary | ICD-10-CM | POA: Diagnosis not present

## 2022-11-20 DIAGNOSIS — R482 Apraxia: Secondary | ICD-10-CM | POA: Diagnosis not present

## 2022-11-20 DIAGNOSIS — E7849 Other hyperlipidemia: Secondary | ICD-10-CM | POA: Diagnosis not present

## 2022-11-20 DIAGNOSIS — R4701 Aphasia: Secondary | ICD-10-CM | POA: Diagnosis not present

## 2022-11-20 DIAGNOSIS — I6602 Occlusion and stenosis of left middle cerebral artery: Secondary | ICD-10-CM | POA: Diagnosis not present

## 2022-11-20 DIAGNOSIS — D649 Anemia, unspecified: Secondary | ICD-10-CM | POA: Diagnosis not present

## 2022-11-20 NOTE — Therapy (Unsigned)
OUTPATIENT SPEECH LANGUAGE PATHOLOGY TREATMENT NOTE     Patient Name: Alan Stewart MRN: [id number] DOB:[birth year], 77 y.o., male Today's Date: 11/20/2022   PCP: Ramonita Lab, MD REFERRING PROVIDER: Ramonita Lab, MD   END OF SESSION   End of Session - 11/20/22 1007     Visit Number 35    Number of Visits 53    Date for SLP Re-Evaluation 01/20/23    Authorization Type [employer] HMO    Authorization Time Period 08/26/2022 thru 11/22/2022    Authorization - Visit Number 32    Authorization - Number of Visits 36    Progress Note Due on Visit 28    SLP Start Time 1000    SLP Stop Time  1100    SLP Time Calculation (min) 60 min    Activity Tolerance Patient tolerated treatment well             Past Medical History:  Diagnosis Date   CAD (coronary artery disease)    a. 10/2021 NSTEMI/PCI: LM nl, LAD min irregs, LCX 86m (2.5x18 Onyx Frontier DES), OM1 nl, RCA 63m (3.5x26 Onyx Frontier DES), 30d.   Diastolic dysfunction    a. 10/2021 Echo: EF 50-55%, no rwma, Gr1 DD, nl RV fxn. No significant valvular dzs.   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 SCCis. MOHs 07/08/22   Past Surgical History:  Procedure Laterality Date   COLONOSCOPY N/A 10/09/2015   Procedure: COLONOSCOPY;  Surgeon: Hulen Luster, MD;  Location: [facility] ENDOSCOPY;  Service: Gastroenterology;  Laterality: N/A;   CORONARY STENT INTERVENTION N/A 10/22/2021   Procedure: CORONARY STENT INTERVENTION;  Surgeon: Wellington Hampshire, MD;  Location: [place] [facility] LAB;  Service: Cardiovascular;  Laterality: N/A;   IR CT HEAD LTD  05/21/2022   IR PERCUTANEOUS ART THROMBECTOMY/INFUSION INTRACRANIAL INC DIAG ANGIO  05/21/2022   LEFT HEART CATH AND CORONARY ANGIOGRAPHY N/A 10/22/2021   Procedure: LEFT HEART CATH AND  CORONARY ANGIOGRAPHY;  Surgeon: Wellington Hampshire, MD;  Location: [place] [facility] LAB;  Service: Cardiovascular;  Laterality: N/A;   LOOP RECORDER INSERTION N/A 05/23/2022   Procedure: LOOP RECORDER INSERTION;  Surgeon: Vickie Epley, MD;  Location: [place] [facility] LAB;  Service: Cardiovascular;  Laterality: N/A;   RADIOLOGY WITH ANESTHESIA N/A 05/20/2022   Procedure: IR WITH ANESTHESIA;  Surgeon: Luanne Bras, MD;  Location: [place];  Service: Radiology;  Laterality: N/A;   ROTATOR CUFF REPAIR     Patient Active Problem List   Diagnosis Date Noted   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 male with PMH: HTN, CAD, HLD, osteoarthritis, SCC, sleep apnea. He presented to the hospital on 05/20/22 with right sided weakness and aphasia with LKW reported by wife to be 10pm on 10/2.  He arrived at [facility]  as code stroke and taken for emergent CT/CTA which demonstrated left M1 occlusion. He received IV tenecteplase and IR thrombectomy due to large vessel occlusion.       DIAGNOSTIC FINDINGS:  MRI 05/22/2022 Cortical ischemia throughout the left MCA territory compatible with recent infarct. Relatively little affect on the white matter. 2. Small focus of acute hemorrhage along the anterior surface of the left temporal lobe Heidelberg classification 3c: Subarachnoid hemorrhage. 3. Normal intracranial MRA.  Restored flow in left MCA.    THERAPY DIAG:  Aphasia  Middle cerebral artery embolism, left  Rationale for Evaluation and Treatment  Rehabilitation  SUBJECTIVE: Pt appeared in a good mood, wife reports he has been resistant toward writing things out at home when having word finding difficulty more recently.  Pt accompanied by: self and significant other  PAIN:  Are you having pain? No  PATIENT GOALS: to be able to communicate again  OBJECTIVE:   TODAY'S TREATMENT: Skilled treatment session focused on pt's communication goals. SLP facilitated session by providing the following interventions:  SLP facilitated letter/sound discrimination by use of phonological treatment for writing. SLP reviewed the following initial sounds: /v/, /l/, /g/, /r/. Pt benefitted from moderate assistance to identify letters when given initial sound with >75% accuracy.   When given moderate assistance, pt able to identify 3/3 words each for the following letters based on initial sounds: /f/, /w/, /j/, /t/, /n/, /d/, /h/. When tasked to identify letters when given an initial sound pt able to identify correct letter/sound correspondence with >75% accuracy, pt benefited from fill in the blank cues.     PATIENT EDUCATION: Education details: see above Person educated: Patient and Wife Education method: Explanation, Media planner, Verbal cues, and Handouts Education comprehension: verbalized understanding  HOME EXERCISE PROGRAM:  Complete calendar  GOALS: Goals reviewed with patient? Yes  SHORT TERM GOALS: Target date: 10 sessions  UPDATED - 06/24/2022  UPDATED - 07/19/2022  UPDATED - 08/20/2022  UPDATED - 09/13/2022 UPDATED 10/09/2022 UPDATED 10/28/22 UPDATED 11/15/22 Given moderate assistance, pt will answer simple basic yes/no questions with 50% accuracy.  Baseline: 25% Goal status: MET UPDATED goal: Given minimal assistance, pt will answer simple basic yes/no questions with 75% accuracy.  Goal status: MET   2.  Given moderate assistance, pt will follow simple basic 1 step directions with 50% accuracy.  Baseline: 0% Goal status:  MET  UPDATED goal: Given minimal assistance, pt will following simple basic 1 step directions with 75% accuracy.   Goal status: MET   3.  With maximal multimodal cues, pt will name set of 10 common objects in 8 out of 10 opportunities.  Baseline: unable Goal status: MET UPDATED goal: With moderate cues, pt will name set of 10 common objects in 8 out of 10 opportunities.  Goal status: MET UPDATED goal: With minimal cues, pt will name set of 10 common objects in 8 out of 10 opportunities.  Goal status: ongoing Goal status: ongoing 4. Pt will improve reading abilities by placing phrase level object function with object in field of 3 with 50% ability and moderate cues.              Baseline: object name to object - 90%             Goal status: MET  UPDATED goal: Pt will demonstrate reading comprehension at the sentence level for basic information with > 75% accuracy and min A cues.   Goal status: ONGOING  Goal status: ONGOING  UPDATED GOAL: 09/13/2022  Pt will demonstrate reading comprehension at the sentence level for ADLs/iADLS with [id number] accuracy given min A cues.    Goal Status: MET  5. Pt will improve spelling abilities by completing common words by filling in missing letter with 50% ability and moderate cues.              Baseline: unable             Goal status: MET UPDATED goal: Pt will improve spelling abilities by completing common words by filling in missing letters with >90% accuracy and rare min A cues.  Goal status: MET UPDATED goal: Pt will improve spelling ability by writing names of basic objects with supervision level cues in 8 out of 10 opportunities.  Goal status: ONGOING UPDATED GOAL: 09/13/2022 Pt will improve spelling ability by accurately writing sentence level description of his ADLs/iADLs with > 75% accuracy with Mod A cues.   Goal status: ongoing 6. Pt will repeat rote phrases with 25% speech intelligibility given max to moderate cues.              Baseline:  unable             Goal status: MET  UPDATED goal: Pt will repeat rote phrases with ~ 75% speech intelligibility given minimal cues.   Goal status: ONGOING  Goal status: ONGOING  Goal status: MET  UPDATED GOAL: 09/13/2022  Pt will read sentences related to his ADLs/iADLs with > 75% speech intelligibility and moderate assistance.  7. Pt will recognize and name lowercase letters in written text with >50% accuracy given maximal cues.  Goal Status: Initial  Goal status: ongoing  LONG TERM GOALS: Target date: 01/20/2023   Pt will use multimodal communication to express basic wants/needs in 4 of 10 opportunities.  Baseline: unable Goal status: ONGOING  Goal status: ongoing 2.  Pt will demonstrate increased reading comprehension by selecting accurate answer from 3 choices in 2 out of 10 opportunities.  Baseline: unable Goal status: ONGOING  Goal Status: MET  Updated Goal: Pt will demonstrate increased reading comprehension by selecting accurate answer from 3 choices in 8 out of 10 opportunities. 3.  Pt will correctly spell his name in 5 out of 10 opportunities.  Baseline: unable Goal status: MET   ASSESSMENT:  CLINICAL IMPRESSION: Pt continues to present with improving moderate expressive communication impairment and moderate apraxia of speech. Pt demonstrates progress in areas of word finding, spelling requiring minimal models. Pt demonstrations good use of strategies (gestures, writing). Pt continues to work toward goals of vocabulary building and phonemic awareness.   OBJECTIVE IMPAIRMENTS include expressive language, receptive language, aphasia, and apraxia. These impairments are limiting patient from managing medications, managing appointments, managing finances, household responsibilities, ADLs/IADLs, and effectively communicating at home and in community. Factors affecting potential to achieve goals and functional outcome are severity of impairments. Patient will benefit from skilled  SLP services to address above impairments and improve overall function.  REHAB POTENTIAL: Excellent  PLAN: SLP FREQUENCY: 3x/week  SLP DURATION: 12 weeks  PLANNED INTERVENTIONS: Language facilitation, Environmental controls, Internal/external aids, Functional tasks, Multimodal communication approach, SLP instruction and feedback, Compensatory strategies, and Patient/family education  Alan Stewart, SLP Graduate Clinician    Alan Stewart, M.S., CCC-SLP, Mining engineer Certified Brain Injury [place] Office [phone] Ascom [phone] Fax [phone]

## 2022-11-22 ENCOUNTER — Ambulatory Visit: Payer: Medicare HMO | Admitting: Speech Pathology

## 2022-11-22 DIAGNOSIS — I6602 Occlusion and stenosis of left middle cerebral artery: Secondary | ICD-10-CM

## 2022-11-22 DIAGNOSIS — R4701 Aphasia: Secondary | ICD-10-CM | POA: Diagnosis not present

## 2022-11-22 DIAGNOSIS — R482 Apraxia: Secondary | ICD-10-CM | POA: Diagnosis not present

## 2022-11-22 NOTE — Therapy (Unsigned)
OUTPATIENT SPEECH LANGUAGE PATHOLOGY TREATMENT NOTE     Patient Name: Alan Stewart MRN: [id number] DOB:06/11/1946, 77 y.o., male Today's Date: 11/22/2022   PCP: Daniel NonesBert Klein, MD REFERRING PROVIDER: Daniel NonesBert Klein, MD   END OF SESSION   End of Session - 11/22/22 1010     Visit Number 62    Number of Visits 92    Date for SLP Re-Evaluation 01/20/23    Authorization Type [employer]    Authorization Time Period 08/26/2022 thru 11/22/2022    Authorization - Visit Number 31    Authorization - Number of Visits 36    Progress Note Due on Visit 60    SLP Start Time 1000    SLP Stop Time  1100    SLP Time Calculation (min) 60 min    Activity Tolerance Patient tolerated treatment well             Past Medical History:  Diagnosis Date   CAD (coronary artery disease)    a. 10/2021 NSTEMI/PCI: LM nl, LAD min irregs, LCX 2486m (2.5x18 Onyx Frontier DES), OM1 nl, RCA 2941m (3.5x26 Onyx Frontier DES), 30d.   Diastolic dysfunction    a. 10/2021 Echo: EF 50-55%, no rwma, Gr1 DD, nl RV fxn. No significant valvular dzs.   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 SCCis. MOHs 07/08/22   Past Surgical History:  Procedure Laterality Date   COLONOSCOPY N/A 10/09/2015   Procedure: COLONOSCOPY;  Surgeon: Wallace CullensPaul Y Oh, MD;  [facility];  Service: Gastroenterology;  Laterality: N/A;   CORONARY STENT INTERVENTION N/A 10/22/2021   Procedure: CORONARY STENT INTERVENTION;  Surgeon: Iran OuchArida, Muhammad A, MD;  Location: [facility] INVASIVE CV LAB;  Service: Cardiovascular;  Laterality: N/A;   IR CT HEAD LTD  05/21/2022   IR PERCUTANEOUS ART THROMBECTOMY/INFUSION INTRACRANIAL INC DIAG ANGIO  05/21/2022   LEFT HEART CATH AND CORONARY ANGIOGRAPHY N/A 10/22/2021   Procedure: LEFT HEART CATH AND  CORONARY ANGIOGRAPHY;  Surgeon: Iran OuchArida, Muhammad A, MD;  Location: [facility] INVASIVE CV LAB;  Service: Cardiovascular;  Laterality: N/A;   LOOP RECORDER INSERTION N/A 05/23/2022   Procedure: LOOP RECORDER INSERTION;  Surgeon: Lanier PrudeLambert, Cameron T, MD;  Location: [facility] INVASIVE CV LAB;  Service: Cardiovascular;  Laterality: N/A;   RADIOLOGY WITH ANESTHESIA N/A 05/20/2022   Procedure: IR WITH ANESTHESIA;  Surgeon: Julieanne Cottoneveshwar, Sanjeev, MD;  Location: [facility] OR;  Service: Radiology;  Laterality: N/A;   ROTATOR CUFF REPAIR     Patient Active Problem List   Diagnosis Date Noted   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 male with PMH: HTN, CAD, HLD, osteoarthritis, SCC, sleep apnea. He presented to the hospital on 05/20/22 with right sided weakness and aphasia with LKW reported by wife to be 10pm on 10/2.  [facility]  as code [facility] and taken for emergent CT/CTA which demonstrated left M1 occlusion. He received IV tenecteplase and IR thrombectomy due to large vessel occlusion.       DIAGNOSTIC FINDINGS:  MRI 05/22/2022 Cortical ischemia throughout the left MCA territory compatible with recent infarct. Relatively little affect on the white matter. 2. Small focus of acute hemorrhage along the anterior surface of the left temporal lobe Heidelberg classification 3c: Subarachnoid hemorrhage. 3. Normal intracranial MRA.  Restored flow in left MCA.    THERAPY DIAG:  Aphasia  Middle cerebral artery embolism, left  Rationale for Evaluation and Treatment  Rehabilitation  SUBJECTIVE: Pt appeared in a good mood.  Pt accompanied by: self and significant other  PAIN:  Are you having pain? No  PATIENT GOALS: to be able to communicate again  OBJECTIVE:   TODAY'S TREATMENT: Skilled treatment session focused on pt's communication goals. SLP facilitated session by providing the following interventions:    When given a field of 3, pt identified letter-sound correspondance with 100% accuracy. When increasing the field of difficulty to 15 letters pt's accuracy decreased to 66%. Suspect pt had memorized groups during review. Cognate pairs proved to be very difficult. Despite verbal and visual information, pt with 75% differential ability between pairs such as t/d, k/g and f/v.   Pt benefited from mod to max assist to imitate sound and write corresponding letter suspect d/t impulsivity.   PATIENT EDUCATION: Education details: see above; specifically hierarchy of spelling to dictations and phonemic awareness Person educated: Patient and Wife Education method: Explanation, Demonstration, Verbal cues, and Handouts Education comprehension: verbalized understanding  HOME EXERCISE PROGRAM:  Identify letters (not sound - only letters)  GOALS: Goals reviewed with patient? Yes  SHORT TERM GOALS: Target date: 10 sessions  UPDATED - 06/24/2022  UPDATED - 07/19/2022  UPDATED - 08/20/2022  UPDATED - 09/13/2022 UPDATED 10/09/2022 UPDATED 10/28/22 UPDATED 11/15/22 Given moderate assistance, pt will answer simple basic yes/no questions with 50% accuracy.  Baseline: 25% Goal status: MET UPDATED goal: Given minimal assistance, pt will answer simple basic yes/no questions with 75% accuracy.  Goal status: MET   2.  Given moderate assistance, pt will follow simple basic 1 step directions with 50% accuracy.  Baseline: 0% Goal status: MET  UPDATED goal: Given minimal assistance, pt will following simple basic 1 step directions with 75% accuracy.   Goal  status: MET   3.  With maximal multimodal cues, pt will name set of 10 common objects in 8 out of 10 opportunities.  Baseline: unable Goal status: MET UPDATED goal: With moderate cues, pt will name set of 10 common objects in 8 out of 10 opportunities.  Goal status: MET UPDATED goal: With minimal cues, pt will name set of 10 common objects in 8 out of 10 opportunities.  Goal status: ongoing Goal status: ongoing 4. Pt will improve reading abilities by placing phrase level object function with object in field of 3 with 50% ability and moderate cues.              Baseline: object name to object - 90%             Goal status: MET  UPDATED goal: Pt will demonstrate reading comprehension at the sentence level for basic information with > 75% accuracy and min A cues.   Goal status: ONGOING  Goal status: ONGOING  UPDATED GOAL: 09/13/2022  Pt will demonstrate reading comprehension at the sentence level for ADLs/iADLS with >75% accuracy given min A cues.  Goal Status: MET  5. Pt will improve spelling abilities by completing common words by filling in missing letter with 50% ability and moderate cues.              Baseline: unable             Goal status: MET UPDATED goal: Pt will improve spelling abilities by completing common words by filling in missing letters with >90% accuracy and rare min A cues.  Goal status: MET UPDATED goal: Pt will improve spelling ability by writing names of basic objects with supervision level cues in 8 out of 10 opportunities.  Goal status: ONGOING UPDATED GOAL: 09/13/2022 Pt will improve spelling ability by accurately writing sentence level description of his ADLs/iADLs with > 75% accuracy with Mod A cues.   Goal status: ongoing 6. Pt will repeat rote phrases with 25% speech intelligibility given max to moderate cues.              Baseline: unable             Goal status: MET  UPDATED goal: Pt will repeat rote phrases with ~ 75% speech intelligibility given  minimal cues.   Goal status: ONGOING  Goal status: ONGOING  Goal status: MET  UPDATED GOAL: 09/13/2022  Pt will read sentences related to his ADLs/iADLs with > 75% speech intelligibility and moderate assistance.  7. Pt will recognize and name lowercase letters in written text with >50% accuracy given maximal cues.  Goal Status: Initial  Goal status: ongoing  LONG TERM GOALS: Target date: 01/20/2023   Pt will use multimodal communication to express basic wants/needs in 4 of 10 opportunities.  Baseline: unable Goal status: ONGOING  Goal status: ongoing 2.  Pt will demonstrate increased reading comprehension by selecting accurate answer from 3 choices in 2 out of 10 opportunities.  Baseline: unable Goal status: ONGOING  Goal Status: MET  Updated Goal: Pt will demonstrate increased reading comprehension by selecting accurate answer from 3 choices in 8 out of 10 opportunities. 3.  Pt will correctly spell his name in 5 out of 10 opportunities.  Baseline: unable Goal status: MET   ASSESSMENT:  CLINICAL IMPRESSION: Pt continues to present with improving moderate expressive communication impairment and moderate apraxia of speech. While pt has made some good progress with phonetics, recommend focusing on letter recognition and then progressing to phonetics.   OBJECTIVE IMPAIRMENTS include expressive language, receptive language, aphasia, and apraxia. These impairments are limiting patient from managing medications, managing appointments, managing finances, household responsibilities, ADLs/IADLs, and effectively communicating at home and in community. Factors affecting potential to achieve goals and functional outcome are severity of impairments. Patient will benefit from skilled SLP services to address above impairments and improve overall function.  REHAB POTENTIAL: Excellent  PLAN: SLP FREQUENCY: 3x/week  SLP DURATION: 12 weeks  PLANNED INTERVENTIONS: Language facilitation,  Environmental controls, Internal/external aids, Functional tasks, Multimodal communication approach, SLP instruction and feedback, Compensatory strategies, and Patient/family education  Georgana Curio, SLP Graduate Clinician    Shah Insley B. Dreama Saa, M.S., CCC-SLP, Tree surgeon Certified Brain Injury Specialist [facility]  [facility] Office [phone] Ascom [phone] Fax [phone]

## 2022-11-23 ENCOUNTER — Other Ambulatory Visit: Payer: Self-pay | Admitting: Cardiovascular Disease

## 2022-11-25 ENCOUNTER — Ambulatory Visit: Payer: Medicare HMO | Admitting: Speech Pathology

## 2022-11-25 ENCOUNTER — Ambulatory Visit (INDEPENDENT_AMBULATORY_CARE_PROVIDER_SITE_OTHER): Payer: Medicare HMO

## 2022-11-25 DIAGNOSIS — I6602 Occlusion and stenosis of left middle cerebral artery: Secondary | ICD-10-CM

## 2022-11-25 DIAGNOSIS — I63412 Cerebral infarction due to embolism of left middle cerebral artery: Secondary | ICD-10-CM

## 2022-11-25 DIAGNOSIS — R4701 Aphasia: Secondary | ICD-10-CM | POA: Diagnosis not present

## 2022-11-25 DIAGNOSIS — R482 Apraxia: Secondary | ICD-10-CM | POA: Diagnosis not present

## 2022-11-25 NOTE — Telephone Encounter (Signed)
Please contact patient for overdue f/u. Last seen by Dr Kirke Corin 03-21-22.  Refill pending f/u,

## 2022-11-25 NOTE — Therapy (Signed)
OUTPATIENT SPEECH LANGUAGE PATHOLOGY TREATMENT NOTE     Patient Name: Alan Stewart MRN: [id number] DOB:12/06/1945, 77 y.o., male Today's Date: 11/25/2022   PCP: Daniel NonesBert Klein, MD REFERRING PROVIDER: Daniel NonesBert Klein, MD   END OF SESSION   End of Session - 11/25/22 1058     Visit Number 63    Number of Visits 92    Date for SLP Re-Evaluation 01/20/23    Authorization Type [employer] H[employer]    Authorization Time Period 11/22/2022 thru 01/27/2023    Authorization - Visit Number 1    Authorization - Number of Visits 20    Progress Note Due on Visit 60    SLP Start Time 1000    SLP Stop Time  1100    SLP Time Calculation (min) 60 min    Activity Tolerance Patient tolerated treatment well             Past Medical History:  Diagnosis Date   CAD (coronary artery disease)    a. 10/2021 NSTEMI/PCI: LM nl, LAD min irregs, LCX 3058m (2.5x18 Onyx Frontier DES), OM1 nl, RCA 6024m (3.5x26 Onyx Frontier DES), 30d.   Diastolic dysfunction    a. 10/2021 Echo: EF 50-55%, no rwma, Gr1 DD, nl RV fxn. No significant valvular dzs.   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 SCCis. MOHs 07/08/22   Past Surgical History:  Procedure Laterality Date   COLONOSCOPY N/A 10/09/2015   Procedure: COLONOSCOPY;  Surgeon: Wallace CullensPaul Y Oh, MD;  [facility];  Service: Gastroenterology;  Laterality: N/A;   CORONARY STENT INTERVENTION N/A 10/22/2021   Procedure: CORONARY STENT INTERVENTION;  Surgeon: Iran OuchArida, Muhammad A, MD;  Location: [facility] INVASIVE CV LAB;  Service: Cardiovascular;  Laterality: N/A;   IR CT HEAD LTD  05/21/2022   IR PERCUTANEOUS ART THROMBECTOMY/INFUSION INTRACRANIAL INC DIAG ANGIO  05/21/2022   LEFT HEART CATH AND CORONARY ANGIOGRAPHY N/A 10/22/2021   Procedure: LEFT HEART CATH AND  CORONARY ANGIOGRAPHY;  Surgeon: Iran OuchArida, Muhammad A, MD;  Location: [facility] INVASIVE CV LAB;  Service: Cardiovascular;  Laterality: N/A;   LOOP RECORDER INSERTION N/A 05/23/2022   Procedure: LOOP RECORDER INSERTION;  Surgeon: Lanier PrudeLambert, Cameron T, MD;  Location: [facility] INVASIVE CV LAB;  Service: Cardiovascular;  Laterality: N/A;   RADIOLOGY WITH ANESTHESIA N/A 05/20/2022   Procedure: IR WITH ANESTHESIA;  Surgeon: Julieanne Cottoneveshwar, Sanjeev, MD;  Location: [facility] OR;  Service: Radiology;  Laterality: N/A;   ROTATOR CUFF REPAIR     Patient Active Problem List   Diagnosis Date Noted   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 male with PMH: HTN, CAD, HLD, osteoarthritis, SCC, sleep apnea. He presented to the hospital on 05/20/22 with right sided weakness and aphasia with LKW reported by wife to be 10pm on 10/2.  [facility]  as code [facility] and taken for emergent CT/CTA which demonstrated left M1 occlusion. He received IV tenecteplase and IR thrombectomy due to large vessel occlusion.       DIAGNOSTIC FINDINGS:  MRI 05/22/2022 Cortical ischemia throughout the left MCA territory compatible with recent infarct. Relatively little affect on the white matter. 2. Small focus of acute hemorrhage along the anterior surface of the left temporal lobe Heidelberg classification 3c: Subarachnoid hemorrhage. 3. Normal intracranial MRA.  Restored flow in left MCA.    THERAPY DIAG:  Aphasia  Middle cerebral artery embolism, left  Rationale for Evaluation and Treatment  Rehabilitation  SUBJECTIVE: Pt appeared in a good mood.  Pt accompanied by: self and significant other  PAIN:  Are you having pain? No  PATIENT GOALS: to be able to communicate again  OBJECTIVE:   TODAY'S TREATMENT: Skilled treatment session focused on pt's communication goals. SLP facilitated session by providing the following interventions:    Pt able to select requested letter from field of entire alphabet.   Spelling to diction - using animals as a category, pt able to select appropriate letter with ~ 75% accuracy and moderate assistance Moderate visual and verbal for intelligible expression of each word.    PATIENT EDUCATION: Education details: see above Person educated: Patient and Wife Education method: Explanation, Facilities manager, Verbal cues, and Handouts Education comprehension: verbalized understanding  HOME EXERCISE PROGRAM:  Complete calendar  GOALS: Goals reviewed with patient? Yes  SHORT TERM GOALS: Target date: 10 sessions  UPDATED - 06/24/2022  UPDATED - 07/19/2022  UPDATED - 08/20/2022  UPDATED - 09/13/2022 UPDATED 10/09/2022 UPDATED 10/28/22 UPDATED 11/15/22 Given moderate assistance, pt will answer simple basic yes/no questions with 50% accuracy.  Baseline: 25% Goal status: MET UPDATED goal: Given minimal assistance, pt will answer simple basic yes/no questions with 75% accuracy.  Goal status: MET   2.  Given moderate assistance, pt will follow simple basic 1 step directions with 50% accuracy.  Baseline: 0% Goal status: MET  UPDATED goal: Given minimal assistance, pt will following simple basic 1 step directions with 75% accuracy.   Goal status: MET   3.  With maximal multimodal cues, pt will name set of 10 common objects in 8 out of 10 opportunities.  Baseline: unable Goal status: MET UPDATED goal: With moderate cues, pt will name set of 10 common objects in 8 out of 10 opportunities.  Goal status: MET UPDATED goal: With minimal cues, pt  will name set of 10 common objects in 8 out of 10 opportunities.  Goal status: ongoing Goal status: ongoing 4. Pt will improve reading abilities by placing phrase level object function with object in field of 3 with 50% ability and moderate cues.              Baseline: object name to object - 90%             Goal status: MET  UPDATED goal: Pt will demonstrate reading comprehension at the sentence level for basic information with > 75% accuracy and min A cues.   Goal status: ONGOING  Goal status: ONGOING  UPDATED GOAL: 09/13/2022  Pt will demonstrate reading comprehension at the sentence level for ADLs/iADLS with >75% accuracy given min A cues.    Goal Status: MET  5. Pt will improve spelling abilities by completing common words by filling in missing letter with 50% ability and moderate cues.              Baseline: unable  Goal status: MET UPDATED goal: Pt will improve spelling abilities by completing common words by filling in missing letters with >90% accuracy and rare min A cues.  Goal status: MET UPDATED goal: Pt will improve spelling ability by writing names of basic objects with supervision level cues in 8 out of 10 opportunities.  Goal status: ONGOING UPDATED GOAL: 09/13/2022 Pt will improve spelling ability by accurately writing sentence level description of his ADLs/iADLs with > 75% accuracy with Mod A cues.   Goal status: ongoing 6. Pt will repeat rote phrases with 25% speech intelligibility given max to moderate cues.              Baseline: unable             Goal status: MET  UPDATED goal: Pt will repeat rote phrases with ~ 75% speech intelligibility given minimal cues.   Goal status: ONGOING  Goal status: ONGOING  Goal status: MET  UPDATED GOAL: 09/13/2022  Pt will read sentences related to his ADLs/iADLs with > 75% speech intelligibility and moderate assistance.  7. Pt will recognize and name lowercase letters in written text with >50% accuracy given maximal  cues.  Goal Status: Initial  Goal status: ongoing  LONG TERM GOALS: Target date: 01/20/2023   Pt will use multimodal communication to express basic wants/needs in 4 of 10 opportunities.  Baseline: unable Goal status: ONGOING  Goal status: ongoing 2.  Pt will demonstrate increased reading comprehension by selecting accurate answer from 3 choices in 2 out of 10 opportunities.  Baseline: unable Goal status: ONGOING  Goal Status: MET  Updated Goal: Pt will demonstrate increased reading comprehension by selecting accurate answer from 3 choices in 8 out of 10 opportunities. 3.  Pt will correctly spell his name in 5 out of 10 opportunities.  Baseline: unable Goal status: MET   ASSESSMENT:  CLINICAL IMPRESSION: Pt continues to present with improving moderate expressive communication impairment and moderate apraxia of speech. Pt demonstrates progress in areas of word finding, spelling requiring minimal models. His wife reports intermittent use of writing for communication at home. This continues to be an area of fluctuating use for pt.   OBJECTIVE IMPAIRMENTS include expressive language, receptive language, aphasia, and apraxia. These impairments are limiting patient from managing medications, managing appointments, managing finances, household responsibilities, ADLs/IADLs, and effectively communicating at home and in community. Factors affecting potential to achieve goals and functional outcome are severity of impairments. Patient will benefit from skilled SLP services to address above impairments and improve overall function.  REHAB POTENTIAL: Excellent  PLAN: SLP FREQUENCY: 3x/week  SLP DURATION: 12 weeks  PLANNED INTERVENTIONS: Language facilitation, Environmental controls, Internal/external aids, Functional tasks, Multimodal communication approach, SLP instruction and feedback, Compensatory strategies, and Patient/family education   Lizzet Hendley B. Dreama Saa, M.S., CCC-SLP,  Tree surgeon Certified Brain Injury Specialist [facility]  [facility] Services Office [phone] Ascom [phone] Fax [phone]

## 2022-11-26 DIAGNOSIS — Z0000 Encounter for general adult medical examination without abnormal findings: Secondary | ICD-10-CM | POA: Diagnosis not present

## 2022-11-26 DIAGNOSIS — I2510 Atherosclerotic heart disease of native coronary artery without angina pectoris: Secondary | ICD-10-CM | POA: Diagnosis not present

## 2022-11-26 DIAGNOSIS — Z1331 Encounter for screening for depression: Secondary | ICD-10-CM | POA: Diagnosis not present

## 2022-11-26 DIAGNOSIS — G4730 Sleep apnea, unspecified: Secondary | ICD-10-CM | POA: Diagnosis not present

## 2022-11-26 DIAGNOSIS — M1990 Unspecified osteoarthritis, unspecified site: Secondary | ICD-10-CM | POA: Diagnosis not present

## 2022-11-26 DIAGNOSIS — I69320 Aphasia following cerebral infarction: Secondary | ICD-10-CM | POA: Diagnosis not present

## 2022-11-26 DIAGNOSIS — E119 Type 2 diabetes mellitus without complications: Secondary | ICD-10-CM | POA: Diagnosis not present

## 2022-11-26 DIAGNOSIS — E785 Hyperlipidemia, unspecified: Secondary | ICD-10-CM | POA: Diagnosis not present

## 2022-11-26 DIAGNOSIS — E039 Hypothyroidism, unspecified: Secondary | ICD-10-CM | POA: Diagnosis not present

## 2022-11-26 DIAGNOSIS — Z6841 Body Mass Index (BMI) 40.0 and over, adult: Secondary | ICD-10-CM | POA: Diagnosis not present

## 2022-11-26 LAB — CUP PACEART REMOTE DEVICE CHECK
Date Time Interrogation Session: 20240409080828
Implantable Pulse Generator Implant Date: 20231005
Pulse Gen Model: 450218
Pulse Gen Serial Number: 95054077

## 2022-11-26 NOTE — Progress Notes (Signed)
[facility] Summary Report / Loop Recorder 

## 2022-11-27 ENCOUNTER — Ambulatory Visit: Payer: Medicare HMO | Admitting: Speech Pathology

## 2022-11-27 DIAGNOSIS — I6602 Occlusion and stenosis of left middle cerebral artery: Secondary | ICD-10-CM | POA: Diagnosis not present

## 2022-11-27 DIAGNOSIS — R482 Apraxia: Secondary | ICD-10-CM | POA: Diagnosis not present

## 2022-11-27 DIAGNOSIS — R4701 Aphasia: Secondary | ICD-10-CM

## 2022-11-27 NOTE — Therapy (Signed)
OUTPATIENT SPEECH LANGUAGE PATHOLOGY TREATMENT NOTE     Patient Name: Alan Stewart MRN: [id number] DOB:[birth year], 77 y.o., male Today's Date: 11/27/2022   PCP: Daniel Nones, MD REFERRING PROVIDER: Daniel Nones, MD   END OF SESSION   End of Session - 11/27/22 1210     Visit Number 64    Number of Visits 92    Date for SLP Re-Evaluation 01/20/23    Authorization Type [employer] H[employer]    Authorization Time Period 11/22/2022 thru 01/27/2023    Authorization - Visit Number 2    Authorization - Number of Visits 20    Progress Note Due on Visit 60    SLP Start Time 1000    SLP Stop Time  1100    SLP Time Calculation (min) 60 min    Activity Tolerance Patient tolerated treatment well             Past Medical History:  Diagnosis Date   CAD (coronary artery disease)    a. 10/2021 NSTEMI/PCI: LM nl, LAD min irregs, LCX 4m (2.5x18 Onyx Frontier DES), OM1 nl, RCA 63m (3.5x26 Onyx Frontier DES), 30d.   Diastolic dysfunction    a. 10/2021 Echo: EF 50-55%, no rwma, Gr1 DD, nl RV fxn. No significant valvular dzs.   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 SCCis. MOHs 07/08/22   Past Surgical History:  Procedure Laterality Date   COLONOSCOPY N/A 10/09/2015   Procedure: COLONOSCOPY;  Surgeon: Wallace Cullens, MD;  Location: [facility] ENDOSCOPY;  Service: Gastroenterology;  Laterality: N/A;   CORONARY STENT INTERVENTION N/A 10/22/2021   Procedure: CORONARY STENT INTERVENTION;  Surgeon: Iran Ouch, MD;  Location: [facility] INVASIVE CV LAB;  Service: Cardiovascular;  Laterality: N/A;   IR CT HEAD LTD  05/21/2022   IR PERCUTANEOUS ART THROMBECTOMY/INFUSION INTRACRANIAL INC DIAG ANGIO  05/21/2022   LEFT HEART CATH AND CORONARY ANGIOGRAPHY N/A 10/22/2021   Procedure: LEFT HEART CATH AND  CORONARY ANGIOGRAPHY;  Surgeon: Iran Ouch, MD;  Location: [facility] INVASIVE CV LAB;  Service: Cardiovascular;  Laterality: N/A;   LOOP RECORDER INSERTION N/A 05/23/2022   Procedure: LOOP RECORDER INSERTION;  Surgeon: Lanier Prude, MD;  Location: [facility] INVASIVE CV LAB;  Service: Cardiovascular;  Laterality: N/A;   RADIOLOGY WITH ANESTHESIA N/A 05/20/2022   Procedure: IR WITH ANESTHESIA;  Surgeon: Julieanne Cotton, MD;  Location: [facility] OR;  Service: Radiology;  Laterality: N/A;   ROTATOR CUFF REPAIR     Patient Active Problem List   Diagnosis Date Noted   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 male with PMH: HTN, CAD, HLD, osteoarthritis, SCC, sleep apnea. He presented to the hospital on 05/20/22 with right sided weakness and aphasia with LKW reported by wife to be 10pm on 10/2.  He arrived at [facility]  as code stroke and taken for emergent CT/CTA which demonstrated left M1 occlusion. He received IV tenecteplase and IR thrombectomy due to large vessel occlusion.       DIAGNOSTIC FINDINGS:  MRI 05/22/2022 Cortical ischemia throughout the left MCA territory compatible with recent infarct. Relatively little affect on the white matter. 2. Small focus of acute hemorrhage along the anterior surface of the left temporal lobe Heidelberg classification 3c: Subarachnoid hemorrhage. 3. Normal intracranial MRA.  Restored flow in left MCA.    THERAPY DIAG:  Aphasia  Rationale for Evaluation and Treatment Rehabilitation  SUBJECTIVE: Pt appeared in a  pleasant mood.  Pt accompanied by: self and significant other  PAIN:  Are you having pain? No  PATIENT GOALS: to be able to communicate again  OBJECTIVE:   TODAY'S TREATMENT: Skilled treatment session focused on pt's communication goals. SLP facilitated session by providing the following interventions: When rolling single dice, pt able to state correct amount and select from a field of 6 written numbers with 100% accuracy.  When rolling 2 dice pt stated the sum in 3/4 opportunities improving to 4/4 when pt wrote the number first.  When rolling 2 dice, pt wrote down the sum in 4/4 opportunities improving in his ability to state the number aloud.  When given card containing a number, pt stated the number in 3/5 opportunities, improving to 5/5 when cued to count aloud for numbers 1 thru 6.  Pt required moderate assist to organize letters into formation of alphabet.  Spelling to diction: using flowers as a category, pt able to select appropriate letter with 87% accuracy with moderate assist. Pt benefited from visual and verbal cues to aid in letter identification.  To increase pts verbal output, SLP facilitated the use of Vnest focusing on functional phrases pertaining to pts daily life.  When provided a verb, pt able to create a sentence using 3 words with max assist. Pt able to identify nouns in 3/5 opportunities improving to 5/5 when provided mod assist.  The following phrases were created and implemented into pts functional phrase notebook:  -I need a soda -I need a paper towel -I need a fork -I need a spook -I need a knife -Do you need a fork?  -Do you need a paper towel?  -Do you need a water?   PATIENT EDUCATION: Education details: see above Person educated: Patient and Wife Education method: Explanation, Facilities managerDemonstration, Verbal cues, and Handouts Education comprehension: verbalized understanding  HOME EXERCISE PROGRAM:  Complete calendar  Practice sentences at mealtimes    GOALS: Goals reviewed with patient? Yes  SHORT TERM GOALS: Target date: 10 sessions  UPDATED - 06/24/2022  UPDATED - 07/19/2022  UPDATED - 08/20/2022  UPDATED - 09/13/2022 UPDATED 10/09/2022 UPDATED 10/28/22 UPDATED 11/15/22 Given moderate assistance, pt will answer simple basic yes/no questions with 50% accuracy.  Baseline: 25% Goal status: MET UPDATED goal: Given minimal assistance, pt will answer simple basic yes/no questions with 75% accuracy.  Goal status: MET   2.  Given moderate assistance, pt will follow simple basic 1 step directions with 50% accuracy.  Baseline: 0% Goal status: MET  UPDATED goal: Given minimal assistance, pt will following simple basic 1 step directions with 75% accuracy.   Goal status: MET   3.  With maximal multimodal cues, pt will name set of 10 common objects in 8 out of 10 opportunities.  Baseline: unable Goal status: MET UPDATED goal: With moderate cues, pt will name set of 10 common objects in 8  out of 10 opportunities.  Goal status: MET UPDATED goal: With minimal cues, pt will name set of 10 common objects in 8 out of 10 opportunities.  Goal status: ongoing Goal status: ongoing 4. Pt will improve reading abilities by placing phrase level object function with object in field of 3 with 50% ability and moderate cues.              Baseline: object name to object - 90%             Goal status: MET  UPDATED goal: Pt will demonstrate reading comprehension at the sentence level for basic information with > 75% accuracy and min A cues.   Goal status: ONGOING  Goal status: ONGOING  UPDATED GOAL: 09/13/2022  Pt will demonstrate reading comprehension at the sentence level for ADLs/iADLS with >75% accuracy given min A cues.    Goal Status: MET  5. Pt will improve spelling abilities by completing common words by filling in missing letter with 50% ability and moderate cues.              Baseline: unable             Goal status: MET UPDATED goal: Pt  will improve spelling abilities by completing common words by filling in missing letters with >90% accuracy and rare min A cues.  Goal status: MET UPDATED goal: Pt will improve spelling ability by writing names of basic objects with supervision level cues in 8 out of 10 opportunities.  Goal status: ONGOING UPDATED GOAL: 09/13/2022 Pt will improve spelling ability by accurately writing sentence level description of his ADLs/iADLs with > 75% accuracy with Mod A cues.   Goal status: ongoing 6. Pt will repeat rote phrases with 25% speech intelligibility given max to moderate cues.              Baseline: unable             Goal status: MET  UPDATED goal: Pt will repeat rote phrases with ~ 75% speech intelligibility given minimal cues.   Goal status: ONGOING  Goal status: ONGOING  Goal status: MET  UPDATED GOAL: 09/13/2022  Pt will read sentences related to his ADLs/iADLs with > 75% speech intelligibility and moderate assistance.  7. Pt will recognize and name lowercase letters in written text with >50% accuracy given maximal cues.  Goal Status: Initial  Goal status: ongoing  LONG TERM GOALS: Target date: 01/20/2023   Pt will use multimodal communication to express basic wants/needs in 4 of 10 opportunities.  Baseline: unable Goal status: ONGOING  Goal status: ongoing 2.  Pt will demonstrate increased reading comprehension by selecting accurate answer from 3 choices in 2 out of 10 opportunities.  Baseline: unable Goal status: ONGOING  Goal Status: MET  Updated Goal: Pt will demonstrate increased reading comprehension by selecting accurate answer from 3 choices in 8 out of 10 opportunities. 3.  Pt will correctly spell his name in 5 out of 10 opportunities.  Baseline: unable Goal status: MET   ASSESSMENT:  CLINICAL IMPRESSION: Pt continues to present with improving moderate expressive communication impairment and moderate apraxia of speech. Pt demonstrates progress in areas of word  finding, spelling requiring minimal models. His wife reports intermittent use of writing for communication at home. This continues to be an area of fluctuating use for pt. In an effort to target pts use of verbal language, Vnest was utilized during todays session to create functional phrases for pt to practice at  home during mealtimes.   OBJECTIVE IMPAIRMENTS include expressive language, receptive language, aphasia, and apraxia. These impairments are limiting patient from managing medications, managing appointments, managing finances, household responsibilities, ADLs/IADLs, and effectively communicating at home and in community. Factors affecting potential to achieve goals and functional outcome are severity of impairments. Patient will benefit from skilled SLP services to address above impairments and improve overall function.  REHAB POTENTIAL: Excellent  PLAN: SLP FREQUENCY: 3x/week  SLP DURATION: 12 weeks  PLANNED INTERVENTIONS: Language facilitation, Environmental controls, Internal/external aids, Functional tasks, Multimodal communication approach, SLP instruction and feedback, Compensatory strategies, and Patient/family education   Happi B. Dreama Saa, M.S., CCC-SLP, Tree surgeon Certified Brain Injury Specialist [facility]  [facility] Rehabilitation Services Office [phone] Ascom [phone] Fax [phone]

## 2022-11-29 ENCOUNTER — Ambulatory Visit: Payer: Medicare HMO | Admitting: Speech Pathology

## 2022-11-29 DIAGNOSIS — R4701 Aphasia: Secondary | ICD-10-CM | POA: Diagnosis not present

## 2022-11-29 DIAGNOSIS — R482 Apraxia: Secondary | ICD-10-CM | POA: Diagnosis not present

## 2022-11-29 DIAGNOSIS — I6602 Occlusion and stenosis of left middle cerebral artery: Secondary | ICD-10-CM | POA: Diagnosis not present

## 2022-11-29 NOTE — Therapy (Signed)
OUTPATIENT SPEECH LANGUAGE PATHOLOGY TREATMENT NOTE     Patient Name: Alan Stewart MRN: [id number] DOB:10-19-1945, 77 y.o., male Today's Date: 11/29/2022   PCP: Daniel Nones, MD REFERRING PROVIDER: Daniel Nones, MD   END OF SESSION   End of Session - 11/29/22 1239     Visit Number 65    Number of Visits 92    Date for SLP Re-Evaluation 01/20/23    Authorization Type [employer] H[employer]    Authorization Time Period 11/22/2022 thru 01/27/2023    Authorization - Visit Number 3    Authorization - Number of Visits 20    Progress Note Due on Visit 60    SLP Start Time 1000    SLP Stop Time  1100    SLP Time Calculation (min) 60 min    Activity Tolerance Patient tolerated treatment well             Past Medical History:  Diagnosis Date   CAD (coronary artery disease)    a. 10/2021 NSTEMI/PCI: LM nl, LAD min irregs, LCX 76m (2.5x18 Onyx Frontier DES), OM1 nl, RCA 48m (3.5x26 Onyx Frontier DES), 30d.   Diastolic dysfunction    a. 10/2021 Echo: EF 50-55%, no rwma, Gr1 DD, nl RV fxn. No significant valvular dzs.   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 SCCis. MOHs 07/08/22   Past Surgical History:  Procedure Laterality Date   COLONOSCOPY N/A 10/09/2015   Procedure: COLONOSCOPY;  Surgeon: Wallace Cullens, MD;  Location: [facility] ENDOSCOPY;  Service: Gastroenterology;  Laterality: N/A;   CORONARY STENT INTERVENTION N/A 10/22/2021   Procedure: CORONARY STENT INTERVENTION;  Surgeon: Iran Ouch, MD;  Location: [facility] INVASIVE CV LAB;  Service: Cardiovascular;  Laterality: N/A;   IR CT HEAD LTD  05/21/2022   IR PERCUTANEOUS ART THROMBECTOMY/INFUSION INTRACRANIAL INC DIAG ANGIO  05/21/2022   LEFT HEART CATH AND CORONARY ANGIOGRAPHY N/A 10/22/2021   Procedure: LEFT HEART CATH AND  CORONARY ANGIOGRAPHY;  Surgeon: Iran Ouch, MD;  Location: [facility] INVASIVE CV LAB;  Service: Cardiovascular;  Laterality: N/A;   LOOP RECORDER INSERTION N/A 05/23/2022   Procedure: LOOP RECORDER INSERTION;  Surgeon: Lanier Prude, MD;  Location: [facility] INVASIVE CV LAB;  Service: Cardiovascular;  Laterality: N/A;   RADIOLOGY WITH ANESTHESIA N/A 05/20/2022   Procedure: IR WITH ANESTHESIA;  Surgeon: Julieanne Cotton, MD;  Location: [facility] OR;  Service: Radiology;  Laterality: N/A;   ROTATOR CUFF REPAIR     Patient Active Problem List   Diagnosis Date Noted   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 male with PMH: HTN, CAD, HLD, osteoarthritis, SCC, sleep apnea. He presented to the hospital on 05/20/22 with right sided weakness and aphasia with LKW reported by wife to be 10pm on 10/2.  He arrived at [facility]  as code stroke and taken for emergent CT/CTA which demonstrated left M1 occlusion. He received IV tenecteplase and IR thrombectomy due to large vessel occlusion.       DIAGNOSTIC FINDINGS:  MRI 05/22/2022 Cortical ischemia throughout the left MCA territory compatible with recent infarct. Relatively little affect on the white matter. 2. Small focus of acute hemorrhage along the anterior surface of the left temporal lobe Heidelberg classification 3c: Subarachnoid hemorrhage. 3. Normal intracranial MRA.  Restored flow in left MCA.    THERAPY DIAG:  Aphasia  Rationale for Evaluation and Treatment Rehabilitation  SUBJECTIVE: Pt appeared in a  pleasant mood, wife reported he did yard work yesterday.  Pt accompanied by: self and significant other  PAIN:  Are you having pain? No  PATIENT GOALS: to be able to communicate again  OBJECTIVE:   TODAY'S TREATMENT: Skilled treatment session focused on pt's communication goals. SLP facilitated session by providing the following interventions: Pt required min assist to organize letters into formation of alphabet, benefiting from a cue to sing ABC's as he organizes.  Spelling to diction: using car brands as a category, pt able to select appropriate letter with 100% accuracy with min assist.  However, when given no verbal repetitions of letters and no initial cue as to the category, pts accuracy decreased to 65% in letter identification.  After given initial model of target words, pt wrote the words with 100% accuracy.  When tasked to verbally spell the words, pts completed the task with 70% accuracy.  Pt benefited from visual and verbal cues to aid in letter identification however pt noted with increased frustration throughout the task.   During a structured activity utilizing playing cards, pt required min cues to count items on card for correct naming on numbers. Pt demonstrated improved awareness as he required minimum cueing to correct his mistakes.   PATIENT EDUCATION: Education details: see above Person educated: Patient and Wife Education method: Explanation, Facilities manager, Verbal cues, and Handouts Education comprehension: verbalized understanding  HOME EXERCISE PROGRAM:  Complete calendar  Practice sentences at mealtimes   GOALS: Goals reviewed with patient? Yes  SHORT TERM GOALS: Target date: 10 sessions  UPDATED - 06/24/2022  UPDATED - 07/19/2022  UPDATED - 08/20/2022  UPDATED - 09/13/2022 UPDATED 10/09/2022 UPDATED 10/28/22 UPDATED 11/15/22 Given moderate assistance, pt will answer simple basic yes/no questions with 50% accuracy.  Baseline: 25% Goal status:  MET UPDATED goal: Given minimal assistance, pt will answer simple basic yes/no questions with 75% accuracy.  Goal status: MET   2.  Given moderate assistance, pt will follow simple basic 1 step directions with 50% accuracy.  Baseline: 0% Goal status: MET  UPDATED goal: Given minimal assistance, pt will following simple basic 1 step directions with 75% accuracy.   Goal status: MET   3.  With maximal multimodal cues, pt will name set of 10 common objects in 8 out of 10 opportunities.  Baseline: unable Goal status: MET UPDATED goal: With moderate cues, pt will name set of 10 common objects in 8 out of 10 opportunities.  Goal status: MET UPDATED goal: With minimal cues, pt will name set of 10 common objects in 8 out of 10 opportunities.  Goal status: ongoing Goal status: ongoing 4. Pt will improve reading abilities by placing phrase level object function with object in field of 3 with 50% ability and moderate cues.              Baseline: object name to object - 90%  Goal status: MET  UPDATED goal: Pt will demonstrate reading comprehension at the sentence level for basic information with > 75% accuracy and min A cues.   Goal status: ONGOING  Goal status: ONGOING  UPDATED GOAL: 09/13/2022  Pt will demonstrate reading comprehension at the sentence level for ADLs/iADLS with >75% accuracy given min A cues.    Goal Status: MET  5. Pt will improve spelling abilities by completing common words by filling in missing letter with 50% ability and moderate cues.              Baseline: unable             Goal status: MET UPDATED goal: Pt will improve spelling abilities by completing common words by filling in missing letters with >90% accuracy and rare min A cues.  Goal status: MET UPDATED goal: Pt will improve spelling ability by writing names of basic objects with supervision level cues in 8 out of 10 opportunities.  Goal status: ONGOING UPDATED GOAL: 09/13/2022 Pt will improve spelling  ability by accurately writing sentence level description of his ADLs/iADLs with > 75% accuracy with Mod A cues.   Goal status: ongoing 6. Pt will repeat rote phrases with 25% speech intelligibility given max to moderate cues.              Baseline: unable             Goal status: MET  UPDATED goal: Pt will repeat rote phrases with ~ 75% speech intelligibility given minimal cues.   Goal status: ONGOING  Goal status: ONGOING  Goal status: MET  UPDATED GOAL: 09/13/2022  Pt will read sentences related to his ADLs/iADLs with > 75% speech intelligibility and moderate assistance.  7. Pt will recognize and name lowercase letters in written text with >50% accuracy given maximal cues.  Goal Status: Initial  Goal status: ongoing  LONG TERM GOALS: Target date: 01/20/2023   Pt will use multimodal communication to express basic wants/needs in 4 of 10 opportunities.  Baseline: unable Goal status: ONGOING  Goal status: ongoing 2.  Pt will demonstrate increased reading comprehension by selecting accurate answer from 3 choices in 2 out of 10 opportunities.  Baseline: unable Goal status: ONGOING  Goal Status: MET  Updated Goal: Pt will demonstrate increased reading comprehension by selecting accurate answer from 3 choices in 8 out of 10 opportunities. 3.  Pt will correctly spell his name in 5 out of 10 opportunities.  Baseline: unable Goal status: MET   ASSESSMENT:  CLINICAL IMPRESSION: Pt continues to present with improving moderate expressive communication impairment and moderate apraxia of speech. Pt demonstrates progress in areas of word finding, spelling requiring minimal models. His wife reports intermittent use of writing for communication at home. This continues to be an area of fluctuating use for pt. Letter identification and naming continues to be an area of difficulty for the pt, will continue to target.   OBJECTIVE IMPAIRMENTS include expressive language, receptive language, aphasia, and  apraxia. These impairments are limiting patient from managing medications, managing appointments, managing finances, household responsibilities, ADLs/IADLs, and effectively communicating at home and in community. Factors affecting potential to achieve goals and functional outcome are severity of impairments. Patient will benefit from skilled SLP services to address above impairments and improve overall function.  REHAB POTENTIAL: Excellent  PLAN: SLP FREQUENCY: 3x/week  SLP DURATION: 12 weeks  PLANNED INTERVENTIONS: Language facilitation, Environmental controls, Internal/external aids, Functional tasks, Multimodal communication approach, SLP instruction and feedback, Compensatory strategies,  and Patient/family education   Happi B. Dreama Saa, M.S., CCC-SLP, Tree surgeon Certified Brain Injury Specialist [facility]  [facility] Rehabilitation Services Office [phone] Ascom [phone] Fax [phone]

## 2022-12-02 ENCOUNTER — Ambulatory Visit: Payer: Medicare HMO | Admitting: Speech Pathology

## 2022-12-02 DIAGNOSIS — R482 Apraxia: Secondary | ICD-10-CM | POA: Diagnosis not present

## 2022-12-02 DIAGNOSIS — I6602 Occlusion and stenosis of left middle cerebral artery: Secondary | ICD-10-CM | POA: Diagnosis not present

## 2022-12-02 DIAGNOSIS — R4701 Aphasia: Secondary | ICD-10-CM | POA: Diagnosis not present

## 2022-12-02 NOTE — Therapy (Unsigned)
OUTPATIENT SPEECH LANGUAGE PATHOLOGY TREATMENT NOTE     Patient Name: Alan Stewart MRN: [id number] DOB:1946/07/15, 77 y.o., male Today's Date: 12/02/2022   PCP: Daniel Nones, MD REFERRING PROVIDER: Daniel Nones, MD   END OF SESSION   End of Session - 12/02/22 1601     Visit Number 66    Number of Visits 92    Date for SLP Re-Evaluation 01/20/23    Authorization Type [employer] H[employer]    Authorization Time Period 11/22/2022 thru 01/27/2023    Authorization - Visit Number 4    Authorization - Number of Visits 20    Progress Note Due on Visit 70    SLP Start Time 1000    SLP Stop Time  1100    SLP Time Calculation (min) 60 min    Activity Tolerance Patient tolerated treatment well             Past Medical History:  Diagnosis Date   CAD (coronary artery disease)    a. 10/2021 NSTEMI/PCI: LM nl, LAD min irregs, LCX 88m (2.5x18 Onyx Frontier DES), OM1 nl, RCA 32m (3.5x26 Onyx Frontier DES), 30d.   Diastolic dysfunction    a. 10/2021 Echo: EF 50-55%, no rwma, Gr1 DD, nl RV fxn. No significant valvular dzs.   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 SCCis. MOHs 07/08/22   Past Surgical History:  Procedure Laterality Date   COLONOSCOPY N/A 10/09/2015   Procedure: COLONOSCOPY;  Surgeon: Wallace Cullens, MD;  Location: [facility] ENDOSCOPY;  Service: Gastroenterology;  Laterality: N/A;   CORONARY STENT INTERVENTION N/A 10/22/2021   Procedure: CORONARY STENT INTERVENTION;  Surgeon: Iran Ouch, MD;  Location: [facility] INVASIVE CV LAB;  Service: Cardiovascular;  Laterality: N/A;   IR CT HEAD LTD  05/21/2022   IR PERCUTANEOUS ART THROMBECTOMY/INFUSION INTRACRANIAL INC DIAG ANGIO  05/21/2022   LEFT HEART CATH AND CORONARY ANGIOGRAPHY N/A 10/22/2021   Procedure: LEFT HEART CATH AND  CORONARY ANGIOGRAPHY;  Surgeon: Iran Ouch, MD;  Location: [facility] INVASIVE CV LAB;  Service: Cardiovascular;  Laterality: N/A;   LOOP RECORDER INSERTION N/A 05/23/2022   Procedure: LOOP RECORDER INSERTION;  Surgeon: Lanier Prude, MD;  Location: [facility] INVASIVE CV LAB;  Service: Cardiovascular;  Laterality: N/A;   RADIOLOGY WITH ANESTHESIA N/A 05/20/2022   Procedure: IR WITH ANESTHESIA;  Surgeon: Julieanne Cotton, MD;  Location: [facility] OR;  Service: Radiology;  Laterality: N/A;   ROTATOR CUFF REPAIR     Patient Active Problem List   Diagnosis Date Noted   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 male with PMH: HTN, CAD, HLD, osteoarthritis, SCC, sleep apnea. He presented to the hospital on 05/20/22 with right sided weakness and aphasia with LKW reported by wife to be 10pm on 10/2.  He arrived at [facility]  as code stroke and taken for emergent CT/CTA which demonstrated left M1 occlusion. He received IV tenecteplase and IR thrombectomy due to large vessel occlusion.       DIAGNOSTIC FINDINGS:  MRI 05/22/2022 Cortical ischemia throughout the left MCA territory compatible with recent infarct. Relatively little affect on the white matter. 2. Small focus of acute hemorrhage along the anterior surface of the left temporal lobe Heidelberg classification 3c: Subarachnoid hemorrhage. 3. Normal intracranial MRA.  Restored flow in left MCA.    THERAPY DIAG:  Aphasia  Rationale for Evaluation and Treatment Rehabilitation  SUBJECTIVE: Pt appeared in a  pleasant mood, reported he celebrated his wife's birthday over the weekend.  Pt accompanied by: self and significant other  PAIN:  Are you having pain? No  PATIENT GOALS: to be able to communicate again  OBJECTIVE:   TODAY'S TREATMENT: Skilled treatment session focused on pt's communication goals. SLP facilitated session by providing the following interventions: SLP facilitated pt unison singing of ABC's, fading pt min assist in to sing alone. Pts accuracy improved when prompted to physically touch each letter as he sang.   Fill in the blank: when provided random words missing a letter, pt able to select appropriate letter to complete the word with 83% accuracy, improving to 100% accuracy when provided visual and verbal cues.  When asked to verbally label target letter selected, pt completed activity with 33% accuracy, improving to 67% when given visual and verbal cues. After given initial model of target words, pt wrote the words with 100% accuracy.  When tasked to verbally spell the words, pts completed the task with 54% accuracy, benefiting from audio feedback to aid in awareness of errors.  To increase pts verbal output, SLP facilitated the use of Vnest focusing on functional phrases pertaining to pts daily life.  When provided a verb, pt able to create a sentence using 3 words with max assist. Pt able to identify nouns in 5/5 opportunities given min assist.  The following phrases were created and implemented into pts functional phrase notebook:  -Youth worker.  -I watch golf.  -I watch wheel of fortune.  -I watch jeopardy.  -I watch daily news with Arna Medici.  During a structured activity utilizing playing cards, pt required min cues to count items on card for correct naming on numbers. Pt demonstrated improved awareness as he required minimum cueing to correct his mistakes.   PATIENT EDUCATION: Education details: see above Person educated: Patient and Wife Education method:  Explanation, Facilities manager, Verbal cues, and Handouts Education comprehension: verbalized understanding  HOME EXERCISE PROGRAM:  Complete calendar  Practice sentences while watching tv.    GOALS: Goals reviewed with patient? Yes  SHORT TERM GOALS: Target date: 10 sessions  UPDATED - 06/24/2022  UPDATED - 07/19/2022  UPDATED - 08/20/2022  UPDATED - 09/13/2022 UPDATED 10/09/2022 UPDATED 10/28/22 UPDATED 11/15/22 Given moderate assistance, pt will answer simple basic yes/no questions with 50% accuracy.  Baseline: 25% Goal status: MET UPDATED goal: Given minimal assistance, pt will answer simple basic yes/no questions with 75% accuracy.  Goal status: MET   2.  Given moderate assistance, pt will follow simple basic 1 step directions with 50% accuracy.  Baseline: 0% Goal status: MET  UPDATED goal: Given minimal assistance, pt will following simple basic 1 step directions with 75% accuracy.   Goal status: MET   3.  With maximal multimodal cues, pt will name set of 10 common objects in 8 out of 10 opportunities.  Baseline: unable Goal  status: MET UPDATED goal: With moderate cues, pt will name set of 10 common objects in 8 out of 10 opportunities.  Goal status: MET UPDATED goal: With minimal cues, pt will name set of 10 common objects in 8 out of 10 opportunities.  Goal status: ongoing Goal status: ongoing 4. Pt will improve reading abilities by placing phrase level object function with object in field of 3 with 50% ability and moderate cues.              Baseline: object name to object - 90%             Goal status: MET  UPDATED goal: Pt will demonstrate reading comprehension at the sentence level for basic information with > 75% accuracy and min A cues.   Goal status: ONGOING  Goal status: ONGOING  UPDATED GOAL: 09/13/2022  Pt will demonstrate reading comprehension at the sentence level for ADLs/iADLS with >75% accuracy given min A cues.    Goal Status: MET  5. Pt will improve  spelling abilities by completing common words by filling in missing letter with 50% ability and moderate cues.              Baseline: unable             Goal status: MET UPDATED goal: Pt will improve spelling abilities by completing common words by filling in missing letters with >90% accuracy and rare min A cues.  Goal status: MET UPDATED goal: Pt will improve spelling ability by writing names of basic objects with supervision level cues in 8 out of 10 opportunities.  Goal status: ONGOING UPDATED GOAL: 09/13/2022 Pt will improve spelling ability by accurately writing sentence level description of his ADLs/iADLs with > 75% accuracy with Mod A cues.   Goal status: ongoing 6. Pt will repeat rote phrases with 25% speech intelligibility given max to moderate cues.              Baseline: unable             Goal status: MET  UPDATED goal: Pt will repeat rote phrases with ~ 75% speech intelligibility given minimal cues.   Goal status: ONGOING  Goal status: ONGOING  Goal status: MET  UPDATED GOAL: 09/13/2022  Pt will read sentences related to his ADLs/iADLs with > 75% speech intelligibility and moderate assistance.  7. Pt will recognize and name lowercase letters in written text with >50% accuracy given maximal cues.  Goal Status: Initial  Goal status: ongoing  LONG TERM GOALS: Target date: 01/20/2023   Pt will use multimodal communication to express basic wants/needs in 4 of 10 opportunities.  Baseline: unable Goal status: ONGOING  Goal status: ongoing 2.  Pt will demonstrate increased reading comprehension by selecting accurate answer from 3 choices in 2 out of 10 opportunities.  Baseline: unable Goal status: ONGOING  Goal Status: MET  Updated Goal: Pt will demonstrate increased reading comprehension by selecting accurate answer from 3 choices in 8 out of 10 opportunities. 3.  Pt will correctly spell his name in 5 out of 10 opportunities.  Baseline: unable Goal status:  MET   ASSESSMENT:  CLINICAL IMPRESSION: Pt continues to present with improving moderate expressive communication impairment and moderate apraxia of speech. Pt demonstrates progress in areas of word finding, spelling requiring minimal models. His wife reports intermittent use of writing for communication at home. This continues to be an area of fluctuating use for pt. Letter identification and naming continues to be an  area of difficulty for the pt, will continue to target. Will continue utilizing VNEST to target functional verbal communication in the home.   OBJECTIVE IMPAIRMENTS include expressive language, receptive language, aphasia, and apraxia. These impairments are limiting patient from managing medications, managing appointments, managing finances, household responsibilities, ADLs/IADLs, and effectively communicating at home and in community. Factors affecting potential to achieve goals and functional outcome are severity of impairments. Patient will benefit from skilled SLP services to address above impairments and improve overall function.  REHAB POTENTIAL: Excellent  PLAN: SLP FREQUENCY: 3x/week  SLP DURATION: 12 weeks  PLANNED INTERVENTIONS: Language facilitation, Environmental controls, Internal/external aids, Functional tasks, Multimodal communication approach, SLP instruction and feedback, Compensatory strategies, and Patient/family education    Happi B. Dreama Saa, M.S., CCC-SLP, Tree surgeon Certified Brain Injury Specialist [facility]  [facility] Services Office [phone] Ascom [phone] Fax [phone]

## 2022-12-04 ENCOUNTER — Ambulatory Visit: Payer: Medicare HMO | Admitting: Speech Pathology

## 2022-12-06 ENCOUNTER — Ambulatory Visit: Payer: Medicare HMO | Admitting: Speech Pathology

## 2022-12-06 DIAGNOSIS — I6602 Occlusion and stenosis of left middle cerebral artery: Secondary | ICD-10-CM

## 2022-12-06 DIAGNOSIS — R4701 Aphasia: Secondary | ICD-10-CM

## 2022-12-06 DIAGNOSIS — R482 Apraxia: Secondary | ICD-10-CM | POA: Diagnosis not present

## 2022-12-06 NOTE — Therapy (Signed)
OUTPATIENT SPEECH LANGUAGE PATHOLOGY TREATMENT NOTE     Patient Name: Alan Stewart MRN: [id number] DOB:1945-10-27, 77 y.o., male Today's Date: 12/06/2022   PCP: Daniel Nones, MD REFERRING PROVIDER: Daniel Nones, MD   END OF SESSION   End of Session - 12/06/22 1154     Visit Number 67    Number of Visits 92    Date for SLP Re-Evaluation 01/20/23    Authorization Type [employer] H[employer]    Authorization Time Period 11/22/2022 thru 01/27/2023    Authorization - Visit Number 5    Authorization - Number of Visits 20    Progress Note Due on Visit 70    SLP Start Time 1000    SLP Stop Time  1100    SLP Time Calculation (min) 60 min    Activity Tolerance Patient tolerated treatment well             Past Medical History:  Diagnosis Date   CAD (coronary artery disease)    a. 10/2021 NSTEMI/PCI: LM nl, LAD min irregs, LCX 21m (2.5x18 Onyx Frontier DES), OM1 nl, RCA 52m (3.5x26 Onyx Frontier DES), 30d.   Diastolic dysfunction    a. 10/2021 Echo: EF 50-55%, no rwma, Gr1 DD, nl RV fxn. No significant valvular dzs.   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 SCCis. MOHs 07/08/22   Past Surgical History:  Procedure Laterality Date   COLONOSCOPY N/A 10/09/2015   Procedure: COLONOSCOPY;  Surgeon: Wallace Cullens, MD;  Location: [facility] ENDOSCOPY;  Service: Gastroenterology;  Laterality: N/A;   CORONARY STENT INTERVENTION N/A 10/22/2021   Procedure: CORONARY STENT INTERVENTION;  Surgeon: Iran Ouch, MD;  Location: [facility] INVASIVE CV LAB;  Service: Cardiovascular;  Laterality: N/A;   IR CT HEAD LTD  05/21/2022   IR PERCUTANEOUS ART THROMBECTOMY/INFUSION INTRACRANIAL INC DIAG ANGIO  05/21/2022   LEFT HEART CATH AND CORONARY ANGIOGRAPHY N/A 10/22/2021   Procedure: LEFT HEART CATH AND  CORONARY ANGIOGRAPHY;  Surgeon: Iran Ouch, MD;  Location: [facility] INVASIVE CV LAB;  Service: Cardiovascular;  Laterality: N/A;   LOOP RECORDER INSERTION N/A 05/23/2022   Procedure: LOOP RECORDER INSERTION;  Surgeon: Lanier Prude, MD;  Location: [facility] INVASIVE CV LAB;  Service: Cardiovascular;  Laterality: N/A;   RADIOLOGY WITH ANESTHESIA N/A 05/20/2022   Procedure: IR WITH ANESTHESIA;  Surgeon: Julieanne Cotton, MD;  Location: [facility] OR;  Service: Radiology;  Laterality: N/A;   ROTATOR CUFF REPAIR     Patient Active Problem List   Diagnosis Date Noted   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 male with PMH: HTN, CAD, HLD, osteoarthritis, SCC, sleep apnea. He presented to the hospital on 05/20/22 with right sided weakness and aphasia with LKW reported by wife to be 10pm on 10/2.  He arrived at [facility]  as code stroke and taken for emergent CT/CTA which demonstrated left M1 occlusion. He received IV tenecteplase and IR thrombectomy due to large vessel occlusion.       DIAGNOSTIC FINDINGS:  MRI 05/22/2022 Cortical ischemia throughout the left MCA territory compatible with recent infarct. Relatively little affect on the white matter. 2. Small focus of acute hemorrhage along the anterior surface of the left temporal lobe Heidelberg classification 3c: Subarachnoid hemorrhage. 3. Normal intracranial MRA.  Restored flow in left MCA.    THERAPY DIAG:  Aphasia  Middle cerebral artery embolism, left  Rationale for Evaluation and Treatment  Rehabilitation  SUBJECTIVE: Pt appeared in a pleasant mood, brought in a gift for SLP students last day.  Pt accompanied by: self and significant other  PAIN:  Are you having pain? No  PATIENT GOALS: to be able to communicate again  OBJECTIVE:   TODAY'S TREATMENT: Skilled treatment session focused on pt's communication goals. SLP facilitated session by providing the following interventions: SLP facilitated pt unison singing of ABC's, pt able to fade to sing alone. Pts accuracy improved when prompted to physically touch each letter as he sang.   Fill in the blank: when provided random words missing a letter, pt able to select appropriate letter to complete the word with 86%% accuracy, improving to 100% accuracy when provided visual and verbal cues.  When asked to verbally label target letter selected, pt completed activity with 29% accuracy, improving to 71% when given visual and verbal cues. After given initial model of target words, pt wrote the words with 100% accuracy.  When tasked to verbally spell the words, pts completed the task with 59% accuracy, benefiting from multimodal cues to improve accuracy.  To increase pts verbal output, SLP facilitated the use of Vnest focusing on functional phrases pertaining to pts daily life.  When provided a verb, pt able to create a sentence using 3 words with max assist. Pt able to identify nouns in 3/3 opportunities given min assist.  The following phrases were created and implemented into pts functional phrase notebook:  -I eat eggs.  -I eat hamburgers with cheese.  -I eat french fries.  During a structured activity utilizing playing cards, pt required min cues to count items on card for correct naming on numbers. Pt benefited from tactile cues to improve accuracy.   PATIENT EDUCATION: Education details: see above Person educated: Patient and Wife Education method: Explanation, Facilities manager, Verbal cues, and Handouts Education comprehension:  verbalized understanding  HOME EXERCISE PROGRAM:  Complete calendar  Practice sentences while watching tv.    GOALS: Goals reviewed with patient? Yes  SHORT TERM GOALS: Target date: 10 sessions  UPDATED - 06/24/2022  UPDATED - 07/19/2022  UPDATED - 08/20/2022  UPDATED - 09/13/2022 UPDATED 10/09/2022 UPDATED 10/28/22 UPDATED 11/15/22 Given moderate assistance, pt will answer simple basic yes/no questions with 50% accuracy.  Baseline: 25% Goal status: MET UPDATED goal: Given minimal assistance, pt will answer simple basic yes/no questions with 75% accuracy.  Goal status: MET   2.  Given moderate assistance, pt will follow simple basic 1 step directions with 50% accuracy.  Baseline: 0% Goal status: MET  UPDATED goal: Given minimal assistance, pt will following simple basic 1 step directions with 75% accuracy.   Goal status: MET   3.  With maximal multimodal cues, pt will name set of 10 common objects in 8 out of 10 opportunities.  Baseline: unable Goal status: MET UPDATED goal: With moderate cues, pt will name set of 10  common objects in 8 out of 10 opportunities.  Goal status: MET UPDATED goal: With minimal cues, pt will name set of 10 common objects in 8 out of 10 opportunities.  Goal status: ongoing Goal status: ongoing 4. Pt will improve reading abilities by placing phrase level object function with object in field of 3 with 50% ability and moderate cues.              Baseline: object name to object - 90%             Goal status: MET  UPDATED goal: Pt will demonstrate reading comprehension at the sentence level for basic information with > 75% accuracy and min A cues.   Goal status: ONGOING  Goal status: ONGOING  UPDATED GOAL: 09/13/2022  Pt will demonstrate reading comprehension at the sentence level for ADLs/iADLS with >75% accuracy given min A cues.    Goal Status: MET  5. Pt will improve spelling abilities by completing common words by filling in missing letter with  50% ability and moderate cues.              Baseline: unable             Goal status: MET UPDATED goal: Pt will improve spelling abilities by completing common words by filling in missing letters with >90% accuracy and rare min A cues.  Goal status: MET UPDATED goal: Pt will improve spelling ability by writing names of basic objects with supervision level cues in 8 out of 10 opportunities.  Goal status: ONGOING UPDATED GOAL: 09/13/2022 Pt will improve spelling ability by accurately writing sentence level description of his ADLs/iADLs with > 75% accuracy with Mod A cues.   Goal status: ongoing 6. Pt will repeat rote phrases with 25% speech intelligibility given max to moderate cues.              Baseline: unable             Goal status: MET  UPDATED goal: Pt will repeat rote phrases with ~ 75% speech intelligibility given minimal cues.   Goal status: ONGOING  Goal status: ONGOING  Goal status: MET  UPDATED GOAL: 09/13/2022  Pt will read sentences related to his ADLs/iADLs with > 75% speech intelligibility and moderate assistance.  7. Pt will recognize and name lowercase letters in written text with >50% accuracy given maximal cues.  Goal Status: Initial  Goal status: ongoing  LONG TERM GOALS: Target date: 01/20/2023   Pt will use multimodal communication to express basic wants/needs in 4 of 10 opportunities.  Baseline: unable Goal status: ONGOING  Goal status: ongoing 2.  Pt will demonstrate increased reading comprehension by selecting accurate answer from 3 choices in 2 out of 10 opportunities.  Baseline: unable Goal status: ONGOING  Goal Status: MET  Updated Goal: Pt will demonstrate increased reading comprehension by selecting accurate answer from 3 choices in 8 out of 10 opportunities. 3.  Pt will correctly spell his name in 5 out of 10 opportunities.  Baseline: unable Goal status: MET   ASSESSMENT:  CLINICAL IMPRESSION: Pt continues to present with improving moderate  expressive communication impairment and moderate apraxia of speech. Pt demonstrates progress in areas of word finding, spelling requiring minimal models. His wife reports intermittent use of writing for communication at home. This continues to be an area of fluctuating use for pt. Letter identification and naming continues to be an area of difficulty for the pt, will continue to target. Will continue utilizing  VNEST to target functional verbal communication in the home.   OBJECTIVE IMPAIRMENTS include expressive language, receptive language, aphasia, and apraxia. These impairments are limiting patient from managing medications, managing appointments, managing finances, household responsibilities, ADLs/IADLs, and effectively communicating at home and in community. Factors affecting potential to achieve goals and functional outcome are severity of impairments. Patient will benefit from skilled SLP services to address above impairments and improve overall function.  REHAB POTENTIAL: Excellent  PLAN: SLP FREQUENCY: 3x/week  SLP DURATION: 12 weeks  PLANNED INTERVENTIONS: Language facilitation, Environmental controls, Internal/external aids, Functional tasks, Multimodal communication approach, SLP instruction and feedback, Compensatory strategies, and Patient/family education    Happi B. Dreama Saa, M.S., CCC-SLP, Tree surgeon Certified Brain Injury Specialist [facility]  [facility] Rehabilitation Services Office [phone] Ascom [phone] Fax [phone]

## 2022-12-07 ENCOUNTER — Other Ambulatory Visit: Payer: Self-pay | Admitting: Nurse Practitioner

## 2022-12-09 ENCOUNTER — Ambulatory Visit: Payer: Medicare HMO | Admitting: Speech Pathology

## 2022-12-09 DIAGNOSIS — R482 Apraxia: Secondary | ICD-10-CM | POA: Diagnosis not present

## 2022-12-09 DIAGNOSIS — R4701 Aphasia: Secondary | ICD-10-CM

## 2022-12-09 DIAGNOSIS — I6602 Occlusion and stenosis of left middle cerebral artery: Secondary | ICD-10-CM

## 2022-12-09 NOTE — Therapy (Signed)
OUTPATIENT SPEECH LANGUAGE PATHOLOGY TREATMENT NOTE     Patient Name: Alan Stewart MRN: [id number] DOB:October 02, 1945, 77 y.o., male Today's Date: 12/09/2022   PCP: Daniel Nones, MD REFERRING PROVIDER: Daniel Nones, MD   END OF SESSION   End of Session - 12/09/22 1005     Visit Number 68    Number of Visits 92    Date for SLP Re-Evaluation 01/20/23    Authorization Type [employer] H[employer]    Authorization Time Period 11/22/2022 thru 01/27/2023    Authorization - Visit Number 6    Authorization - Number of Visits 20    Progress Note Due on Visit 70    SLP Start Time 1000    SLP Stop Time  1100    SLP Time Calculation (min) 60 min    Activity Tolerance Patient tolerated treatment well             Past Medical History:  Diagnosis Date   CAD (coronary artery disease)    a. 10/2021 NSTEMI/PCI: LM nl, LAD min irregs, LCX 35m (2.5x18 Onyx Frontier DES), OM1 nl, RCA 72m (3.5x26 Onyx Frontier DES), 30d.   Diastolic dysfunction    a. 10/2021 Echo: EF 50-55%, no rwma, Gr1 DD, nl RV fxn. No significant valvular dzs.   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 SCCis. MOHs 07/08/22   Past Surgical History:  Procedure Laterality Date   COLONOSCOPY N/A 10/09/2015   Procedure: COLONOSCOPY;  Surgeon: Wallace Cullens, MD;  Location: [facility] ENDOSCOPY;  Service: Gastroenterology;  Laterality: N/A;   CORONARY STENT INTERVENTION N/A 10/22/2021   Procedure: CORONARY STENT INTERVENTION;  Surgeon: Iran Ouch, MD;  Location: [facility] INVASIVE CV LAB;  Service: Cardiovascular;  Laterality: N/A;   IR CT HEAD LTD  05/21/2022   IR PERCUTANEOUS ART THROMBECTOMY/INFUSION INTRACRANIAL INC DIAG ANGIO  05/21/2022   LEFT HEART CATH AND CORONARY ANGIOGRAPHY N/A 10/22/2021   Procedure: LEFT HEART CATH AND  CORONARY ANGIOGRAPHY;  Surgeon: Iran Ouch, MD;  Location: [facility] INVASIVE CV LAB;  Service: Cardiovascular;  Laterality: N/A;   LOOP RECORDER INSERTION N/A 05/23/2022   Procedure: LOOP RECORDER INSERTION;  Surgeon: Lanier Prude, MD;  Location: [facility] INVASIVE CV LAB;  Service: Cardiovascular;  Laterality: N/A;   RADIOLOGY WITH ANESTHESIA N/A 05/20/2022   Procedure: IR WITH ANESTHESIA;  Surgeon: Julieanne Cotton, MD;  Location: [facility] OR;  Service: Radiology;  Laterality: N/A;   ROTATOR CUFF REPAIR     Patient Active Problem List   Diagnosis Date Noted   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 male with PMH: HTN, CAD, HLD, osteoarthritis, SCC, sleep apnea. He presented to the hospital on 05/20/22 with right sided weakness and aphasia with LKW reported by wife to be 10pm on 10/2.  He arrived at [facility]  as code stroke and taken for emergent CT/CTA which demonstrated left M1 occlusion. He received IV tenecteplase and IR thrombectomy due to large vessel occlusion.       DIAGNOSTIC FINDINGS:  MRI 05/22/2022 Cortical ischemia throughout the left MCA territory compatible with recent infarct. Relatively little affect on the white matter. 2. Small focus of acute hemorrhage along the anterior surface of the left temporal lobe Heidelberg classification 3c: Subarachnoid hemorrhage. 3. Normal intracranial MRA.  Restored flow in left MCA.    THERAPY DIAG:  Aphasia  Middle cerebral artery embolism, left  Rationale for Evaluation and Treatment  Rehabilitation  SUBJECTIVE: pt attended session by himself, wife wrote note explaing she was at an appt Pt accompanied by: self and significant other  PAIN:  Are you having pain? No  PATIENT GOALS: to be able to communicate again  OBJECTIVE:   TODAY'S TREATMENT: Skilled treatment session focused on pt's communication goals. SLP facilitated session by providing the following interventions:  Pt placed physical letters in order with Mod I for correction of y/x; pt able to say each each letter correctly and fixed y/x error  Moderate verbal cues for comprehension - "use letters to spell [employer] - pt unable to spell, referred to write word in his notes but spelled "Molly" Pt confused, unable to associate verbal or written word with name of his male dog "Mason" Total assistance provided  Need to build auditory awareness - pt with incorrect motor movements changing words but unable to understand listening task of SLP saying incorrect vs/ correct word - "Jeoparly vs Jeopardy"  Given verbal, pt able to select written sentence in 14 out of 24 opportunities, improving to 24 out of 24 with moderate verbal and written cues d/t receptive language deficits  Within context of opposites - pt with difficulty writing down verbal word for example opposite of "interesting" SLP stated "boring" to which pt wrote "warm, warmth, but" required SLP to write down boring     PATIENT EDUCATION: Education details: see above Person educated: Patient and Wife Education method: Explanation, Demonstration, Verbal cues, and Handouts Education comprehension: verbalized understanding  HOME EXERCISE PROGRAM:  Complete calendar  Practice sentences while watching tv.    GOALS: Goals reviewed with patient? Yes  SHORT TERM GOALS: Target date: 10 sessions  UPDATED - 06/24/2022  UPDATED - 07/19/2022  UPDATED - 08/20/2022  UPDATED - 09/13/2022 UPDATED 10/09/2022 UPDATED 10/28/22 UPDATED 11/15/22 Given moderate  assistance, pt will answer simple basic yes/no questions with 50% accuracy.  Baseline: 25% Goal status: MET UPDATED goal: Given minimal assistance, pt will answer simple basic yes/no questions with 75% accuracy.  Goal status: MET   2.  Given moderate assistance, pt will follow simple basic 1 step directions with 50% accuracy.  Baseline: 0% Goal status: MET  UPDATED goal: Given minimal assistance, pt will following simple basic 1 step directions with 75% accuracy.   Goal status: MET   3.  With maximal multimodal cues, pt will name set of 10 common objects in 8 out of 10 opportunities.  Baseline: unable Goal status: MET UPDATED goal: With moderate cues, pt will name set of 10 common objects in 8 out of 10 opportunities.  Goal status: MET UPDATED goal: With minimal cues, pt will name set of 10 common objects in 8 out of 10 opportunities.  Goal status: ongoing Goal status: ongoing 4. Pt will improve reading abilities by placing phrase level object function with object in field of 3 with 50% ability and moderate cues.  Baseline: object name to object - 90%             Goal status: MET  UPDATED goal: Pt will demonstrate reading comprehension at the sentence level for basic information with > 75% accuracy and min A cues.   Goal status: ONGOING  Goal status: ONGOING  UPDATED GOAL: 09/13/2022  Pt will demonstrate reading comprehension at the sentence level for ADLs/iADLS with >75% accuracy given min A cues.    Goal Status: MET  5. Pt will improve spelling abilities by completing common words by filling in missing letter with 50% ability and moderate cues.              Baseline: unable             Goal status: MET UPDATED goal: Pt will improve spelling abilities by completing common words by filling in missing letters with >90% accuracy and rare min A cues.  Goal status: MET UPDATED goal: Pt will improve spelling ability by writing names of basic objects with supervision level  cues in 8 out of 10 opportunities.  Goal status: ONGOING UPDATED GOAL: 09/13/2022 Pt will improve spelling ability by accurately writing sentence level description of his ADLs/iADLs with > 75% accuracy with Mod A cues.   Goal status: ongoing 6. Pt will repeat rote phrases with 25% speech intelligibility given max to moderate cues.              Baseline: unable             Goal status: MET  UPDATED goal: Pt will repeat rote phrases with ~ 75% speech intelligibility given minimal cues.   Goal status: ONGOING  Goal status: ONGOING  Goal status: MET  UPDATED GOAL: 09/13/2022  Pt will read sentences related to his ADLs/iADLs with > 75% speech intelligibility and moderate assistance.  7. Pt will recognize and name lowercase letters in written text with >50% accuracy given maximal cues.  Goal Status: Initial  Goal status: ongoing  LONG TERM GOALS: Target date: 01/20/2023   Pt will use multimodal communication to express basic wants/needs in 4 of 10 opportunities.  Baseline: unable Goal status: ONGOING  Goal status: ongoing 2.  Pt will demonstrate increased reading comprehension by selecting accurate answer from 3 choices in 2 out of 10 opportunities.  Baseline: unable Goal status: ONGOING  Goal Status: MET  Updated Goal: Pt will demonstrate increased reading comprehension by selecting accurate answer from 3 choices in 8 out of 10 opportunities. 3.  Pt will correctly spell his name in 5 out of 10 opportunities.  Baseline: unable Goal status: MET   ASSESSMENT:  CLINICAL IMPRESSION: While pt continues to make progress, during today's session, pt had difficulty self-monitoring for moments of apraxia of speech as well as receptive language. This made verbal expression of more complex ideas difficulty. Will target in future sessions.    OBJECTIVE IMPAIRMENTS include expressive language, receptive language, aphasia, and apraxia. These impairments are limiting patient from managing  medications, managing appointments, managing finances, household responsibilities, ADLs/IADLs, and effectively communicating at home and in community. Factors affecting potential to achieve goals and functional outcome are severity of impairments. Patient will benefit from skilled SLP services to address above impairments and improve overall function.  REHAB POTENTIAL: Excellent  PLAN: SLP FREQUENCY: 3x/week  SLP DURATION: 12 weeks  PLANNED INTERVENTIONS: Language facilitation, Environmental controls, Internal/external aids, Functional tasks, Multimodal communication approach, SLP instruction and feedback, Compensatory strategies, and Patient/family education    Dainel Arcidiacono B. Dreama Saa,  M.S., CCC-SLP, Financial trader Injury [place]  [place] Office [phone] Ascom [phone] Fax [phone]

## 2022-12-09 NOTE — Telephone Encounter (Signed)
Please contact pt for future appointment. Pt overdue for 6 month f/u.  

## 2022-12-11 ENCOUNTER — Ambulatory Visit: Payer: Medicare HMO | Admitting: Speech Pathology

## 2022-12-11 DIAGNOSIS — R482 Apraxia: Secondary | ICD-10-CM | POA: Diagnosis not present

## 2022-12-11 DIAGNOSIS — R4701 Aphasia: Secondary | ICD-10-CM | POA: Diagnosis not present

## 2022-12-11 DIAGNOSIS — I6602 Occlusion and stenosis of left middle cerebral artery: Secondary | ICD-10-CM

## 2022-12-11 NOTE — Therapy (Signed)
OUTPATIENT SPEECH LANGUAGE PATHOLOGY TREATMENT NOTE     Patient Name: Alan Stewart MRN: [id number] DOB:11-07-1945, 77 y.o., male Today's Date: 12/11/2022   PCP: Daniel Nones, MD REFERRING PROVIDER: Daniel Nones, MD   END OF SESSION   End of Session - 12/11/22 0958     Visit Number 69    Number of Visits 92    Date for SLP Re-Evaluation 01/20/23    Authorization Type [employer] H[employer]    Authorization Time Period 11/22/2022 thru 01/27/2023    Authorization - Visit Number 7    Authorization - Number of Visits 20    Progress Note Due on Visit 70    SLP Start Time 1000    SLP Stop Time  1100    SLP Time Calculation (min) 60 min    Activity Tolerance Patient tolerated treatment well             Past Medical History:  Diagnosis Date   CAD (coronary artery disease)    a. 10/2021 NSTEMI/PCI: LM nl, LAD min irregs, LCX 64m (2.5x18 Onyx Frontier DES), OM1 nl, RCA 71m (3.5x26 Onyx Frontier DES), 30d.   Diastolic dysfunction    a. 10/2021 Echo: EF 50-55%, no rwma, Gr1 DD, nl RV fxn. No significant valvular dzs.   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 SCCis. MOHs 07/08/22   Past Surgical History:  Procedure Laterality Date   COLONOSCOPY N/A 10/09/2015   Procedure: COLONOSCOPY;  Surgeon: Wallace Cullens, MD;  Location: [facility] ENDOSCOPY;  Service: Gastroenterology;  Laterality: N/A;   CORONARY STENT INTERVENTION N/A 10/22/2021   Procedure: CORONARY STENT INTERVENTION;  Surgeon: Iran Ouch, MD;  Location: [facility] INVASIVE CV LAB;  Service: Cardiovascular;  Laterality: N/A;   IR CT HEAD LTD  05/21/2022   IR PERCUTANEOUS ART THROMBECTOMY/INFUSION INTRACRANIAL INC DIAG ANGIO  05/21/2022   LEFT HEART CATH AND CORONARY ANGIOGRAPHY N/A 10/22/2021   Procedure: LEFT HEART CATH AND  CORONARY ANGIOGRAPHY;  Surgeon: Iran Ouch, MD;  Location: [facility] INVASIVE CV LAB;  Service: Cardiovascular;  Laterality: N/A;   LOOP RECORDER INSERTION N/A 05/23/2022   Procedure: LOOP RECORDER INSERTION;  Surgeon: Lanier Prude, MD;  Location: [facility] INVASIVE CV LAB;  Service: Cardiovascular;  Laterality: N/A;   RADIOLOGY WITH ANESTHESIA N/A 05/20/2022   Procedure: IR WITH ANESTHESIA;  Surgeon: Julieanne Cotton, MD;  Location: [facility] OR;  Service: Radiology;  Laterality: N/A;   ROTATOR CUFF REPAIR     Patient Active Problem List   Diagnosis Date Noted   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 male with PMH: HTN, CAD, HLD, osteoarthritis, SCC, sleep apnea. He presented to the hospital on 05/20/22 with right sided weakness and aphasia with LKW reported by wife to be 10pm on 10/2.  He arrived at [facility]  as code stroke and taken for emergent CT/CTA which demonstrated left M1 occlusion. He received IV tenecteplase and IR thrombectomy due to large vessel occlusion.       DIAGNOSTIC FINDINGS:  MRI 05/22/2022 Cortical ischemia throughout the left MCA territory compatible with recent infarct. Relatively little affect on the white matter. 2. Small focus of acute hemorrhage along the anterior surface of the left temporal lobe Heidelberg classification 3c: Subarachnoid hemorrhage. 3. Normal intracranial MRA.  Restored flow in left MCA.    THERAPY DIAG:  Aphasia  Apraxia  Middle cerebral artery embolism, left  Rationale for Evaluation and Treatment  Rehabilitation  SUBJECTIVE: pt attended session by himself, wife wrote note explaing she was at an appt Pt accompanied by: self and significant other  PAIN:  Are you having pain? No  PATIENT GOALS: to be able to communicate again  OBJECTIVE:   TODAY'S TREATMENT: Skilled treatment session focused on pt's communication goals. SLP facilitated session by providing the following interventions:  During attempts to ocmmunicate basic information, when promopted pt with use written communication to suppplement his verbal expression. While this is beneficial in establishing general context, the burden remains on the listener to ask probbative questions. During motivational interviewing, pt's most pressing need is including more language during written description to aid in listener understanding.   With max multimodal cues, pt able to increase written description including the use of drawing.     PATIENT EDUCATION: Education details: see above Person educated: Patient and Wife Education method: Explanation, Facilities manager, Verbal cues, and Handouts Education comprehension: verbalized understanding  HOME EXERCISE PROGRAM:  Complete calendar  Practice sentences while watching tv.    GOALS: Goals reviewed with patient? Yes  SHORT TERM GOALS: Target date: 10 sessions  UPDATED - 06/24/2022  UPDATED - 07/19/2022  UPDATED - 08/20/2022  UPDATED - 09/13/2022 UPDATED 10/09/2022 UPDATED 10/28/22 UPDATED 11/15/22 Given moderate assistance, pt will answer simple basic yes/no questions with 50% accuracy.  Baseline: 25% Goal status: MET UPDATED goal: Given minimal assistance, pt will answer simple basic yes/no questions with 75% accuracy.  Goal status: MET   2.  Given moderate assistance, pt will follow simple basic 1 step directions with 50% accuracy.  Baseline: 0% Goal status: MET  UPDATED goal: Given minimal assistance, pt will following simple basic 1 step directions with 75% accuracy.    Goal status: MET   3.  With maximal multimodal cues, pt will name set of 10 common objects in 8 out of 10 opportunities.  Baseline: unable Goal status: MET UPDATED goal: With moderate cues, pt will name set of 10 common objects in 8 out of 10 opportunities.  Goal status: MET UPDATED goal: With minimal cues, pt will name set of 10 common objects in 8 out of 10 opportunities.  Goal status: ongoing Goal status: ongoing 4. Pt will improve reading abilities by placing phrase level object function with object in field of 3 with 50% ability and moderate cues.              Baseline: object name to object - 90%             Goal status: MET  UPDATED goal: Pt will demonstrate reading comprehension at the sentence level for basic information with > 75% accuracy and min A cues.   Goal status: ONGOING  Goal status: ONGOING  UPDATED GOAL: 09/13/2022  Pt will demonstrate reading comprehension at the sentence level for ADLs/iADLS with >75% accuracy given min A cues.  Goal Status: MET  5. Pt will improve spelling abilities by completing common words by filling in missing letter with 50% ability and moderate cues.              Baseline: unable             Goal status: MET UPDATED goal: Pt will improve spelling abilities by completing common words by filling in missing letters with >90% accuracy and rare min A cues.  Goal status: MET UPDATED goal: Pt will improve spelling ability by writing names of basic objects with supervision level cues in 8 out of 10 opportunities.  Goal status: ONGOING UPDATED GOAL: 09/13/2022 Pt will improve spelling ability by accurately writing sentence level description of his ADLs/iADLs with > 75% accuracy with Mod A cues.   Goal status: ongoing 6. Pt will repeat rote phrases with 25% speech intelligibility given max to moderate cues.              Baseline: unable             Goal status: MET  UPDATED goal: Pt will repeat rote phrases with ~ 75% speech intelligibility  given minimal cues.   Goal status: ONGOING  Goal status: ONGOING  Goal status: MET  UPDATED GOAL: 09/13/2022  Pt will read sentences related to his ADLs/iADLs with > 75% speech intelligibility and moderate assistance.  7. Pt will recognize and name lowercase letters in written text with >50% accuracy given maximal cues.  Goal Status: Initial  Goal status: ongoing  LONG TERM GOALS: Target date: 01/20/2023   Pt will use multimodal communication to express basic wants/needs in 4 of 10 opportunities.  Baseline: unable Goal status: ONGOING  Goal status: ongoing 2.  Pt will demonstrate increased reading comprehension by selecting accurate answer from 3 choices in 2 out of 10 opportunities.  Baseline: unable Goal status: ONGOING  Goal Status: MET  Updated Goal: Pt will demonstrate increased reading comprehension by selecting accurate answer from 3 choices in 8 out of 10 opportunities. 3.  Pt will correctly spell his name in 5 out of 10 opportunities.  Baseline: unable Goal status: MET   ASSESSMENT:  CLINICAL IMPRESSION: While pt continues to make progress, during today's session, pt had difficulty self-monitoring for moments of apraxia of speech as well as receptive language. This made verbal expression of more complex ideas difficulty. Will target in future sessions.    OBJECTIVE IMPAIRMENTS include expressive language, receptive language, aphasia, and apraxia. These impairments are limiting patient from managing medications, managing appointments, managing finances, household responsibilities, ADLs/IADLs, and effectively communicating at home and in community. Factors affecting potential to achieve goals and functional outcome are severity of impairments. Patient will benefit from skilled SLP services to address above impairments and improve overall function.  REHAB POTENTIAL: Excellent  PLAN: SLP FREQUENCY: 3x/week  SLP DURATION: 12 weeks  PLANNED INTERVENTIONS: Language  facilitation, Environmental controls, Internal/external aids, Functional tasks, Multimodal communication approach, SLP instruction and feedback, Compensatory strategies, and Patient/family education    Ernst Cumpston B. Dreama Saa, M.S., CCC-SLP, Tree surgeon Certified Brain Injury Specialist [facility]  [facility] Office [phone] Ascom [phone] Fax [phone]

## 2022-12-13 ENCOUNTER — Ambulatory Visit: Payer: Medicare HMO | Admitting: Speech Pathology

## 2022-12-13 DIAGNOSIS — R4701 Aphasia: Secondary | ICD-10-CM | POA: Diagnosis not present

## 2022-12-13 DIAGNOSIS — R482 Apraxia: Secondary | ICD-10-CM | POA: Diagnosis not present

## 2022-12-13 DIAGNOSIS — I6602 Occlusion and stenosis of left middle cerebral artery: Secondary | ICD-10-CM

## 2022-12-13 NOTE — Therapy (Unsigned)
OUTPATIENT SPEECH LANGUAGE PATHOLOGY TREATMENT NOTE 10th VISIT PROGRESS NOTE     Patient Name: Alan Stewart MRN: [id number] DOB:Jan 23, 1946, 77 y.o., male Today's Date: 12/13/2022  Speech Therapy Progress Note  Dates of Reporting Period: 11/15/2022 to 12/13/2022  Objective: Patient has been seen for 10 speech therapy sessions this reporting period targeting expressive communication. Patient is making progress toward LTGs and met 3 STGs this reporting period. See skilled intervention, clinical impressions, and goals below for details.  PCP: Alan Nones, MD REFERRING PROVIDER: Daniel Nones, MD   END OF SESSION   End of Session - 12/13/22 1522     Visit Number 70    Number of Visits 92    Date for SLP Re-Evaluation 01/20/23    Authorization Type [employer] H[employer]    Authorization Time Period 11/22/2022 thru 01/27/2023    Authorization - Visit Number 8    Authorization - Number of Visits 20    Progress Note Due on Visit 70    SLP Start Time 1000    SLP Stop Time  1100    SLP Time Calculation (min) 60 min    Activity Tolerance Patient tolerated treatment well             Past Medical History:  Diagnosis Date   CAD (coronary artery disease)    a. 10/2021 NSTEMI/PCI: LM nl, LAD min irregs, LCX 40m (2.5x18 Onyx Frontier DES), OM1 nl, RCA 51m (3.5x26 Onyx Frontier DES), 30d.   Diastolic dysfunction    a. 10/2021 Echo: EF 50-55%, no rwma, Gr1 DD, nl RV fxn. No significant valvular dzs.   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 SCCis. MOHs 07/08/22   Past Surgical History:  Procedure Laterality Date   COLONOSCOPY N/A 10/09/2015   Procedure: COLONOSCOPY;  Surgeon: Alan Cullens, MD;  Location: [facility] ENDOSCOPY;  Service: Gastroenterology;  Laterality: N/A;   CORONARY  STENT INTERVENTION N/A 10/22/2021   Procedure: CORONARY STENT INTERVENTION;  Surgeon: Alan Ouch, MD;  Location: [facility] INVASIVE CV LAB;  Service: Cardiovascular;  Laterality: N/A;   IR CT HEAD LTD  05/21/2022   IR PERCUTANEOUS ART THROMBECTOMY/INFUSION INTRACRANIAL INC DIAG ANGIO  05/21/2022   LEFT HEART CATH AND CORONARY ANGIOGRAPHY N/A 10/22/2021   Procedure: LEFT HEART CATH AND CORONARY ANGIOGRAPHY;  Surgeon: Alan Ouch, MD;  Location: [facility] INVASIVE CV LAB;  Service: Cardiovascular;  Laterality: N/A;   LOOP RECORDER INSERTION N/A 05/23/2022   Procedure: LOOP RECORDER INSERTION;  Surgeon: Alan Prude, MD;  Location: [facility] INVASIVE CV LAB;  Service: Cardiovascular;  Laterality: N/A;   RADIOLOGY WITH ANESTHESIA N/A 05/20/2022   Procedure: IR WITH ANESTHESIA;  Surgeon: Alan Cotton, MD;  Location: [facility] OR;  Service: Radiology;  Laterality: N/A;   ROTATOR CUFF REPAIR     Patient Active Problem List   Diagnosis Date Noted   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 male with PMH: HTN, CAD, HLD, osteoarthritis, SCC, sleep apnea. He presented to the hospital on 05/20/22 with right sided weakness and aphasia with LKW reported by wife to be 10pm on 10/2.  He arrived at [facility] as code stroke and taken for emergent CT/CTA which demonstrated left M1 occlusion. He received IV tenecteplase and IR thrombectomy due to large vessel occlusion.       DIAGNOSTIC FINDINGS:  MRI 05/22/2022 Cortical ischemia throughout the left MCA territory  compatible with recent infarct. Relatively little affect on the white matter. 2. Small focus of acute hemorrhage along the anterior surface of the left temporal lobe Heidelberg classification 3c: Subarachnoid hemorrhage. 3. Normal intracranial MRA.  Restored flow in left MCA.    THERAPY DIAG:  Aphasia  Middle cerebral artery embolism, left  Rationale for Evaluation and Treatment Rehabilitation  SUBJECTIVE:   SUBJECTIVE STATEMENT: pt pleasant, continues to state that he can drive Pt accompanied by: self and significant other  PAIN:  Are you having pain? No  PATIENT GOALS: to be able to communicate again  OBJECTIVE:   TODAY'S TREATMENT: Skilled treatment session focused on pt's communication goals. SLP facilitated session by providing the following interventions:  Improve written description   PATIENT EDUCATION: Education details: see above Person educated: Patient and Wife Education method: Explanation, Demonstration, Verbal cues, and Handouts Education comprehension: verbalized understanding  HOME EXERCISE PROGRAM:  Complete calendar  Practice sentences while watching tv.    GOALS: Goals reviewed with patient? Yes  SHORT TERM GOALS: Target date: 10 sessions  UPDATED 12/13/2022 - see previous notes for progress towards goals   3.   With minimal cues, pt will name set of 10 common objects in 8 out of 10 opportunities.  Goal status: MET; upgraded to: Pt will improve wording finding abilities by writing 3 or more words during communication exchanges to increase listener understanding with moderate A in 5 out of 7 opportunities.  5. Pt will improve spelling ability by accurately writing sentence level description of his ADLs/iADLs with > 75% accuracy with Mod A cues.   Goal status: NOT MET; downgraded to: Pt will improve spelling ability by accurately spelling words during communication exchanges to increase listener understanding with min A in 5 out of 7 opportunities.   6.  Pt will read sentences related to his ADLs/iADLs with > 75% speech intelligibility and moderate assistance.    Goal Status: MET; upgraded to: Pt will read self-generated words during communication exchanges with > 50% given mod A.  7.  Pt will recognize and name lowercase letters in written text with >50% accuracy given maximal cues.  Goal status: discontinued d/t higher level receptive language deficits    LONG TERM GOALS: Target date: 01/20/2023   Pt will use multimodal communication to express basic wants/needs in 4 of 10 opportunities.  Baseline: unable Goal status: ONGOING    2.  Pt will demonstrate increased reading comprehension by selecting accurate answer from 3 choices in 8 out of 10 opportunities.  Goal status: ONGOING  ASSESSMENT:  CLINICAL IMPRESSION: While pt continues to make progress, during today's session, pt had difficulty self-monitoring for moments of apraxia of speech as well as receptive language. This made verbal expression of more complex ideas difficulty. Will target in future sessions.    OBJECTIVE IMPAIRMENTS include expressive language, receptive language, aphasia, and apraxia. These impairments are limiting patient from managing medications,  managing appointments, managing finances, household responsibilities, ADLs/IADLs, and effectively communicating at home and in community. Factors affecting potential to achieve goals and functional outcome are severity of impairments. Patient will benefit from skilled SLP services to address above impairments and improve overall function.  REHAB POTENTIAL: Excellent  PLAN: SLP FREQUENCY: 3x/week  SLP DURATION: 12 weeks  PLANNED INTERVENTIONS: Language facilitation, Environmental controls, Internal/external aids, Functional tasks, Multimodal communication approach, SLP instruction and feedback, Compensatory strategies, and Patient/family education    Ane Conerly B. Dreama Saa, M.S., CCC-SLP, Research officer, trade union Certified Brain Injury Specialist [facility]  [facility] Rehabilitation Services Office [phone] Ascom [phone] Fax [phone]

## 2022-12-16 ENCOUNTER — Ambulatory Visit: Payer: Medicare HMO | Admitting: Speech Pathology

## 2022-12-16 DIAGNOSIS — R482 Apraxia: Secondary | ICD-10-CM

## 2022-12-16 DIAGNOSIS — R4701 Aphasia: Secondary | ICD-10-CM | POA: Diagnosis not present

## 2022-12-16 DIAGNOSIS — I6602 Occlusion and stenosis of left middle cerebral artery: Secondary | ICD-10-CM | POA: Diagnosis not present

## 2022-12-16 NOTE — Therapy (Addendum)
OUTPATIENT SPEECH LANGUAGE PATHOLOGY TREATMENT NOTE      Patient Name: Alan Stewart MRN: [id number] DOB:12-Sep-1945, 77 y.o., male Today's Date: 12/16/2022   PCP: Daniel Nones, MD REFERRING PROVIDER: Daniel Nones, MD   END OF SESSION  End of Session - 12/16/22 1344     Visit Number 71    Number of Visits 92    Date for SLP Re-Evaluation 01/20/23    Authorization Type [employer] H[employer]    Authorization Time Period 11/22/2022 thru 01/27/2023    Authorization - Visit Number 9    Authorization - Number of Visits 20    Progress Note Due on Visit 80    SLP Start Time 1100    SLP Stop Time  1200    SLP Time Calculation (min) 60 min    Activity Tolerance Patient tolerated treatment well             Past Medical History:  Diagnosis Date   CAD (coronary artery disease)    a. 10/2021 NSTEMI/PCI: LM nl, LAD min irregs, LCX 29m (2.5x18 Onyx Frontier DES), OM1 nl, RCA 40m (3.5x26 Onyx Frontier DES), 30d.   Diastolic dysfunction    a. 10/2021 Echo: EF 50-55%, no rwma, Gr1 DD, nl RV fxn. No significant valvular dzs.   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 SCCis. MOHs 07/08/22   Past Surgical History:  Procedure Laterality Date   COLONOSCOPY N/A 10/09/2015   Procedure: COLONOSCOPY;  Surgeon: Wallace Cullens, MD;  Location: [facility] ENDOSCOPY;  Service: Gastroenterology;  Laterality: N/A;   CORONARY STENT INTERVENTION N/A 10/22/2021   Procedure: CORONARY STENT INTERVENTION;  Surgeon: Iran Ouch, MD;  Location: [facility] INVASIVE CV LAB;  Service: Cardiovascular;  Laterality: N/A;   IR CT HEAD LTD  05/21/2022   IR PERCUTANEOUS ART THROMBECTOMY/INFUSION INTRACRANIAL INC DIAG ANGIO  05/21/2022   LEFT HEART CATH AND CORONARY ANGIOGRAPHY N/A 10/22/2021   Procedure: LEFT HEART CATH AND  CORONARY ANGIOGRAPHY;  Surgeon: Iran Ouch, MD;  Location: [facility] INVASIVE CV LAB;  Service: Cardiovascular;  Laterality: N/A;   LOOP RECORDER INSERTION N/A 05/23/2022   Procedure: LOOP RECORDER INSERTION;  Surgeon: Lanier Prude, MD;  Location: [facility] INVASIVE CV LAB;  Service: Cardiovascular;  Laterality: N/A;   RADIOLOGY WITH ANESTHESIA N/A 05/20/2022   Procedure: IR WITH ANESTHESIA;  Surgeon: Julieanne Cotton, MD;  Location: [facility] OR;  Service: Radiology;  Laterality: N/A;   ROTATOR CUFF REPAIR     Patient Active Problem List   Diagnosis Date Noted   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 male with PMH: HTN, CAD, HLD, osteoarthritis, SCC, sleep apnea. He presented to the hospital on 05/20/22 with right sided weakness and aphasia with LKW reported by wife to be 10pm on 10/2.  He arrived at [facility] as code stroke and taken for emergent CT/CTA which demonstrated left M1 occlusion. He received IV tenecteplase and IR thrombectomy due to large vessel occlusion.       DIAGNOSTIC FINDINGS:  MRI 05/22/2022 Cortical ischemia throughout the left MCA territory compatible with recent infarct. Relatively little affect on the white matter. 2. Small focus of acute hemorrhage along the anterior surface of the left temporal lobe Heidelberg classification 3c: Subarachnoid hemorrhage. 3. Normal intracranial MRA.  Restored flow in left MCA.    THERAPY DIAG:  Aphasia  Apraxia  Middle cerebral artery embolism, left  Rationale for Evaluation and  Treatment Rehabilitation  SUBJECTIVE:   SUBJECTIVE STATEMENT: Pt very talkative, his wife reports increased happiness following golf with his son on Sunday Pt accompanied by: self and significant other  PAIN:  Are you having pain? No  PATIENT GOALS: to be able to communicate again  OBJECTIVE:   TODAY'S TREATMENT: Skilled treatment session focused on pt's communication goals. SLP facilitated session by providing the following interventions:  Facilitating word finding thru written language Pt required maximal assistance to write more than 1 word during conversation, uncertain if pt understands focus of task. When additional assistance provided, pt observed writing down semantically related words but unproductive to listener Spelling accuracy when writing self-generated words: Pt unable to spell common words such as the name of his dog. Able to start word to diction with 75% accuracy and moderate cues Reading accuracy: Pt able to state, "that's not right" when reviewing words    PATIENT EDUCATION: Education details: see above Person educated: Patient and Wife Education method: Explanation, Demonstration, Verbal cues, and Handouts Education comprehension: verbalized understanding  HOME EXERCISE PROGRAM:  Complete calendar  Practice sentences while watching tv.    GOALS: Goals reviewed with patient? Yes  SHORT TERM GOALS: Target date: 10 sessions  UPDATED 12/13/2022 - see previous notes for progress towards goals   3.   With minimal cues, pt will name set of 10 common objects in 8 out of 10 opportunities.  Goal status: MET; upgraded to: Pt will improve wording finding abilities by writing 3 or more words during communication exchanges to increase listener understanding with moderate A in 5 out of 7 opportunities.  5. Pt will improve spelling ability by accurately writing sentence level description of his ADLs/iADLs with > 75% accuracy with Mod A cues.   Goal status: NOT MET;  downgraded to: Pt will improve spelling ability by accurately spelling words during communication exchanges to increase listener understanding with min A in 5 out of 7 opportunities.  6.  Pt will read sentences related to his ADLs/iADLs with > 75% speech intelligibility and moderate assistance.    Goal Status: MET; upgraded to: Pt will read self-generated words during communication exchanges with > 50% given mod A.  7.  Pt will recognize and name lowercase letters in written text with >50% accuracy given maximal cues.  Goal status: discontinued d/t higher level receptive language deficits    LONG TERM GOALS: Target date: 01/20/2023   UPDATED 12/13/2022 - see previous notes for progress towards goals  Pt will use multimodal communication to express basic wants/needs in 4 of 10 opportunities.  Baseline: unable Goal status: ONGOING    2.  Pt will demonstrate increased reading comprehension by selecting accurate answer from 3 choices in 8 out of 10 opportunities.  Goal status: ONGOING  ASSESSMENT:  CLINICAL IMPRESSION: While pt continues to make progress, during today's session, pt had difficulty self-monitoring for moments  of apraxia of speech as well as receptive language. This made verbal expression of more complex ideas difficulty. Will target in future sessions.    OBJECTIVE IMPAIRMENTS include expressive language, receptive language, aphasia, and apraxia. These impairments are limiting patient from managing medications, managing appointments, managing finances, household responsibilities, ADLs/IADLs, and effectively communicating at home and in community. Factors affecting potential to achieve goals and functional outcome are severity of impairments. Patient will benefit from skilled SLP services to address above impairments and improve overall function.  REHAB POTENTIAL: Excellent  PLAN: SLP FREQUENCY: 3x/week  SLP DURATION: 12 weeks  PLANNED INTERVENTIONS: Language facilitation,  Environmental controls, Internal/external aids, Functional tasks, Multimodal communication approach, SLP instruction and feedback, Compensatory strategies, and Patient/family education    Adelena Desantiago B. Dreama Saa, M.S., CCC-SLP, Tree surgeon Certified Brain Injury Specialist [facility]  [facility] Rehabilitation Services Office [phone] Ascom [phone] Fax [phone]

## 2022-12-18 ENCOUNTER — Ambulatory Visit: Payer: Medicare HMO | Attending: Internal Medicine | Admitting: Speech Pathology

## 2022-12-18 DIAGNOSIS — R482 Apraxia: Secondary | ICD-10-CM | POA: Diagnosis not present

## 2022-12-18 DIAGNOSIS — I6602 Occlusion and stenosis of left middle cerebral artery: Secondary | ICD-10-CM

## 2022-12-18 DIAGNOSIS — R4701 Aphasia: Secondary | ICD-10-CM | POA: Insufficient documentation

## 2022-12-18 NOTE — Therapy (Addendum)
OUTPATIENT SPEECH LANGUAGE PATHOLOGY TREATMENT NOTE    Patient Name: Alan Stewart MRN: [id number] DOB:[birth year], 77 y.o., male Today's Date: 12/18/2022   PCP: Daniel Nones, MD REFERRING PROVIDER: Daniel Nones, MD   END OF SESSION  End of Session - 12/18/2022 1344     Visit Number 72    Number of Visits 92    Date for SLP Re-Evaluation 01/20/23    Authorization Type [employer]    Authorization Time Period 11/22/2022 thru 01/27/2023    Authorization - Visit Number 10    Authorization - Number of Visits 20    Progress Note Due on Visit 80    SLP Start Time 1100    SLP Stop Time  1200    SLP Time Calculation (min) 60 min    Activity Tolerance Patient tolerated treatment well             Past Medical History:  Diagnosis Date   CAD (coronary artery disease)    a. 10/2021 NSTEMI/PCI: LM nl, LAD min irregs, LCX 77m (2.5x18 Onyx Frontier DES), OM1 nl, RCA 53m (3.5x26 Onyx Frontier DES), 30d.   Diastolic dysfunction    a. 10/2021 Echo: EF 50-55%, no rwma, Gr1 DD, nl RV fxn. No significant valvular dzs.   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 SCCis. MOHs 07/08/22   Past Surgical History:  Procedure Laterality Date   COLONOSCOPY N/A 10/09/2015   Procedure: COLONOSCOPY;  Surgeon: Wallace Cullens, MD;  Location: [facility] ENDOSCOPY;  Service: Gastroenterology;  Laterality: N/A;   CORONARY STENT INTERVENTION N/A 10/22/2021   Procedure: CORONARY STENT INTERVENTION;  Surgeon: Iran Ouch, MD;  Location: [facility] INVASIVE CV LAB;  Service: Cardiovascular;  Laterality: N/A;   IR CT HEAD LTD  05/21/2022   IR PERCUTANEOUS ART THROMBECTOMY/INFUSION INTRACRANIAL INC DIAG ANGIO  05/21/2022   LEFT HEART CATH AND CORONARY ANGIOGRAPHY N/A 10/22/2021   Procedure: LEFT HEART CATH AND  CORONARY ANGIOGRAPHY;  Surgeon: Iran Ouch, MD;  Location: [facility] INVASIVE CV LAB;  Service: Cardiovascular;  Laterality: N/A;   LOOP RECORDER INSERTION N/A 05/23/2022   Procedure: LOOP RECORDER INSERTION;  Surgeon: Lanier Prude, MD;  Location: [facility] INVASIVE CV LAB;  Service: Cardiovascular;  Laterality: N/A;   RADIOLOGY WITH ANESTHESIA N/A 05/20/2022   Procedure: IR WITH ANESTHESIA;  Surgeon: Julieanne Cotton, MD;  Location: [facility] OR;  Service: Radiology;  Laterality: N/A;   ROTATOR CUFF REPAIR     Patient Active Problem List   Diagnosis Date Noted   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 male with PMH: HTN, CAD, HLD, osteoarthritis, SCC, sleep apnea. He presented to the hospital on 05/20/22 with right sided weakness and aphasia with LKW reported by wife to be 10pm on 10/2.  He arrived at [facility] as  code stroke and taken for emergent CT/CTA which demonstrated left M1 occlusion. He received IV tenecteplase and IR thrombectomy due to large vessel occlusion.       DIAGNOSTIC FINDINGS:  MRI 05/22/2022 Cortical ischemia throughout the left MCA territory compatible with recent infarct. Relatively little affect on the white matter. 2. Small focus of acute hemorrhage along the anterior surface of the left temporal lobe Heidelberg classification 3c: Subarachnoid hemorrhage. 3. Normal intracranial MRA.  Restored flow in left MCA.    THERAPY DIAG:  Aphasia  Apraxia  Middle cerebral artery embolism, left  Rationale for Evaluation and  Treatment Rehabilitation  SUBJECTIVE:   SUBJECTIVE STATEMENT: Pt very talkative, his wife reports increased happiness following golf with his son on Sunday Pt accompanied by: self and significant other  PAIN:  Are you having pain? No  PATIENT GOALS: to be able to communicate again  OBJECTIVE:   TODAY'S TREATMENT: Skilled treatment session focused on pt's communication goals. SLP facilitated session by providing the following interventions:  Facilitating word finding thru written language Given a basic object (flowers present in SLP's office, vase, pencil, deck of cards), pt able to write down a 1 word description, with maximal questions, pt able to list 3 additional words to aid in description/word finding.  Spelling accuracy when writing self-generated words: With minimal assistance, pt able to sort letters into alphabetical order and with cues, able to point to each letter and state correct Given a printed word, pt unable to spell the word Writing to diction, pt able to write down letters for nonsense words with 18/19 accuracy and words with 15/20 accuracy. Difficult to assess if pt understand that activity was targeting increased written output to aid in listener understanding     PATIENT EDUCATION: Education details: see above Person educated: Patient and Wife Education method: Explanation, Demonstration, Verbal cues, and Handouts Education comprehension: verbalized understanding  HOME EXERCISE PROGRAM:  Complete calendar Put lists in alphabetical order When writing down information, cue pt to write down additional words     GOALS: Goals reviewed with patient? Yes  SHORT TERM GOALS: Target date: 10 sessions  UPDATED 12/13/2022 - see previous notes for progress towards goals   3.   With minimal cues, pt will name set of 10 common objects in 8 out of 10 opportunities.  Goal status: MET; upgraded to: Pt will improve wording finding abilities by writing 3 or more words  during communication exchanges to increase listener understanding with moderate A in 5 out of 7 opportunities.  5. Pt will improve spelling ability by accurately writing sentence level description of his ADLs/iADLs with > 75% accuracy with Mod A cues.   Goal status: NOT MET; downgraded to: Pt will improve spelling ability by accurately spelling words during communication exchanges to increase listener understanding with min A in 5 out of 7 opportunities.  6.  Pt will read sentences related to his ADLs/iADLs with > 75% speech intelligibility and moderate assistance.    Goal Status: MET; upgraded to: Pt will read self-generated words during communication exchanges with > 50% given mod A.  7.  Pt will recognize and name lowercase letters in written text with >50% accuracy given maximal cues.  Goal status: discontinued d/t higher level receptive language deficits    LONG TERM GOALS: Target date: 01/20/2023   UPDATED 12/13/2022 - see previous notes for progress towards goals  Pt will use multimodal communication to express basic wants/needs in 4 of 10 opportunities.  Baseline: unable Goal status:  ONGOING    2.  Pt will demonstrate increased reading comprehension by selecting accurate answer from 3 choices in 8 out of 10 opportunities.  Goal status: ONGOING  ASSESSMENT:  CLINICAL IMPRESSION: Pt with increased spelling and spelling to diction.    OBJECTIVE IMPAIRMENTS include expressive language, receptive language, aphasia, and apraxia. These impairments are limiting patient from managing medications, managing appointments, managing finances, household responsibilities, ADLs/IADLs, and effectively communicating at home and in community. Factors affecting potential to achieve goals and functional outcome are severity of impairments. Patient will benefit from skilled SLP services to address above impairments and improve overall function.  REHAB POTENTIAL: Excellent  PLAN: SLP FREQUENCY:  3x/week  SLP DURATION: 12 weeks  PLANNED INTERVENTIONS: Language facilitation, Environmental controls, Internal/external aids, Functional tasks, Multimodal communication approach, SLP instruction and feedback, Compensatory strategies, and Patient/family education    Luciel Brickman B. Dreama Saa, M.S., CCC-SLP, Tree surgeon Certified Brain Injury Specialist [facility]  [facility] Rehabilitation Services Office [phone] Ascom [phone] Fax [phone]

## 2022-12-20 ENCOUNTER — Ambulatory Visit: Payer: Medicare HMO | Admitting: Speech Pathology

## 2022-12-20 DIAGNOSIS — R482 Apraxia: Secondary | ICD-10-CM | POA: Diagnosis not present

## 2022-12-20 DIAGNOSIS — I6602 Occlusion and stenosis of left middle cerebral artery: Secondary | ICD-10-CM

## 2022-12-20 DIAGNOSIS — R4701 Aphasia: Secondary | ICD-10-CM

## 2022-12-23 ENCOUNTER — Ambulatory Visit: Payer: Medicare HMO | Admitting: Speech Pathology

## 2022-12-23 DIAGNOSIS — R4701 Aphasia: Secondary | ICD-10-CM

## 2022-12-23 DIAGNOSIS — I6602 Occlusion and stenosis of left middle cerebral artery: Secondary | ICD-10-CM | POA: Diagnosis not present

## 2022-12-23 DIAGNOSIS — R482 Apraxia: Secondary | ICD-10-CM | POA: Diagnosis not present

## 2022-12-23 NOTE — Therapy (Signed)
OUTPATIENT SPEECH LANGUAGE PATHOLOGY TREATMENT NOTE      Patient Name: Alan Stewart MRN: [id number] DOB:02/04/1946, 77 y.o., male Today's Date: 12/20/2022   PCP: Daniel Nones, MD REFERRING PROVIDER: Daniel Nones, MD   END OF SESSION  End of Session - 12/20/22 1108     Visit Number 73    Number of Visits 92    Date for SLP Re-Evaluation 01/20/23    Authorization Type [employer] H[employer]    Authorization Time Period 11/22/2022 thru 01/27/2023    Authorization - Visit Number 11    Authorization - Number of Visits 20    Progress Note Due on Visit 80    SLP Start Time 1000    SLP Stop Time  1100    SLP Time Calculation (min) 60 min    Activity Tolerance Patient tolerated treatment well             Past Medical History:  Diagnosis Date   CAD (coronary artery disease)    a. 10/2021 NSTEMI/PCI: LM nl, LAD min irregs, LCX 78m (2.5x18 Onyx Frontier DES), OM1 nl, RCA 30m (3.5x26 Onyx Frontier DES), 30d.   Diastolic dysfunction    a. 10/2021 Echo: EF 50-55%, no rwma, Gr1 DD, nl RV fxn. No significant valvular dzs.   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 SCCis. MOHs 07/08/22   Past Surgical History:  Procedure Laterality Date   COLONOSCOPY N/A 10/09/2015   Procedure: COLONOSCOPY;  Surgeon: Wallace Cullens, MD;  Location: [facility] ENDOSCOPY;  Service: Gastroenterology;  Laterality: N/A;   CORONARY STENT INTERVENTION N/A 10/22/2021   Procedure: CORONARY STENT INTERVENTION;  Surgeon: Iran Ouch, MD;  Location: [facility] INVASIVE CV LAB;  Service: Cardiovascular;  Laterality: N/A;   IR CT HEAD LTD  05/21/2022   IR PERCUTANEOUS ART THROMBECTOMY/INFUSION INTRACRANIAL INC DIAG ANGIO  05/21/2022   LEFT HEART CATH AND CORONARY ANGIOGRAPHY N/A 10/22/2021   Procedure: LEFT HEART CATH AND  CORONARY ANGIOGRAPHY;  Surgeon: Iran Ouch, MD;  Location: [facility] INVASIVE CV LAB;  Service: Cardiovascular;  Laterality: N/A;   LOOP RECORDER INSERTION N/A 05/23/2022   Procedure: LOOP RECORDER INSERTION;  Surgeon: Lanier Prude, MD;  Location: [facility] INVASIVE CV LAB;  Service: Cardiovascular;  Laterality: N/A;   RADIOLOGY WITH ANESTHESIA N/A 05/20/2022   Procedure: IR WITH ANESTHESIA;  Surgeon: Julieanne Cotton, MD;  Location: [facility] OR;  Service: Radiology;  Laterality: N/A;   ROTATOR CUFF REPAIR     Patient Active Problem List   Diagnosis Date Noted   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 male with PMH: HTN, CAD, HLD, osteoarthritis, SCC, sleep apnea. He presented to the hospital on 05/20/22 with right sided weakness and aphasia with LKW reported by wife to be 10pm on 10/2.  He arrived at [facility] as code stroke and taken for emergent CT/CTA which demonstrated left M1 occlusion. He received IV tenecteplase and IR thrombectomy due to large vessel occlusion.       DIAGNOSTIC FINDINGS:  MRI 05/22/2022 Cortical ischemia throughout the left MCA territory compatible with recent infarct. Relatively little affect on the white matter. 2. Small focus of acute hemorrhage along the anterior surface of the left temporal lobe Heidelberg classification 3c: Subarachnoid hemorrhage. 3. Normal intracranial MRA.  Restored flow in left MCA.    THERAPY DIAG:  Aphasia  Middle cerebral artery embolism, left  Rationale for Evaluation and Treatment  Rehabilitation  SUBJECTIVE:   SUBJECTIVE STATEMENT: Pt very talkative, his wife reports increased happiness following golf with his son on Sunday Pt accompanied by: self and significant other  PAIN:  Are you having pain? No  PATIENT GOALS: to be able to communicate again  OBJECTIVE:   TODAY'S TREATMENT: Skilled treatment session focused on pt's communication goals. SLP facilitated session by providing the following interventions:  Facilitating word finding thru written language Given 3  basic but similar objects (cup, pencil, chap sticks), pt able to write down a 1 word description, with maximal questions, pt able to list 3 additional words to aid in description/word finding to help differentiate each object.  Spelling accuracy when writing self-generated words: With minimal assistance, pt able to sort letters into alphabetical order and with cues, able to point to each letter and state correct Given a printed word, pt unable to spell the word Writing to diction, pt able to write down letters for nonsense words with 18/19 accuracy and words with 15/20 accuracy. Difficult to assess if pt understand that activity was targeting increased written output to aid in listener understanding   PATIENT EDUCATION: Education details: see above Person educated: Patient and Wife Education method: Explanation, Demonstration, Verbal cues, and Handouts Education comprehension: verbalized understanding  HOME EXERCISE PROGRAM:  Complete calendar Put lists in alphabetical order When writing down information, cue pt to write down additional words     GOALS: Goals reviewed with patient? Yes  SHORT TERM GOALS: Target date: 10 sessions  UPDATED 12/13/2022 - see previous notes for progress towards goals   3.   With minimal cues, pt will name set of 10 common objects in 8 out of 10 opportunities.  Goal status: MET; upgraded to: Pt will improve wording finding abilities by writing 3 or more words  during communication exchanges to increase listener understanding with moderate A in 5 out of 7 opportunities.  5. Pt will improve spelling ability by accurately writing sentence level description of his ADLs/iADLs with > 75% accuracy with Mod A cues.   Goal status: NOT MET; downgraded to: Pt will improve spelling ability by accurately spelling words during communication exchanges to increase listener understanding with min A in 5 out of 7 opportunities.  6.  Pt will read sentences related to his ADLs/iADLs with > 75% speech intelligibility and moderate assistance.    Goal Status: MET; upgraded to: Pt will read self-generated words during communication exchanges with > 50% given mod A.  7.  Pt will recognize and name lowercase letters in written text with >50% accuracy given maximal cues.  Goal status: discontinued d/t higher level receptive language deficits    LONG TERM GOALS: Target date: 01/20/2023   UPDATED 12/13/2022 - see previous notes for progress towards goals  Pt will use multimodal communication to express basic wants/needs in 4 of 10 opportunities.  Baseline: unable Goal status:  ONGOING    2.  Pt will demonstrate increased reading comprehension by selecting accurate answer from 3 choices in 8 out of 10 opportunities.  Goal status: ONGOING  ASSESSMENT:  CLINICAL IMPRESSION: Pt with increased spelling and spelling to diction. Will continue to work towards providing increased descriptive words.    OBJECTIVE IMPAIRMENTS include expressive language, receptive language, aphasia, and apraxia. These impairments are limiting patient from managing medications, managing appointments, managing finances, household responsibilities, ADLs/IADLs, and effectively communicating at home and in community. Factors affecting potential to achieve goals and functional outcome are severity of impairments. Patient will benefit from skilled SLP services to address above impairments and improve overall  function.  REHAB POTENTIAL: Excellent  PLAN: SLP FREQUENCY: 3x/week  SLP DURATION: 12 weeks  PLANNED INTERVENTIONS: Language facilitation, Environmental controls, Internal/external aids, Functional tasks, Multimodal communication approach, SLP instruction and feedback, Compensatory strategies, and Patient/family education    Alan Stewart, M.S., CCC-SLP, Tree surgeon Certified Brain Injury Specialist [facility]  [facility] Rehabilitation Services Office [phone] Ascom [phone] Fax [phone]

## 2022-12-23 NOTE — Therapy (Signed)
OUTPATIENT SPEECH LANGUAGE PATHOLOGY TREATMENT NOTE     Patient Name: Alan Stewart MRN: [id number] DOB:1946-07-29, 77 y.o., male Today's Date: 12/23/2022   PCP: Daniel Nones, MD REFERRING PROVIDER: Daniel Nones, MD   END OF SESSION  End of Session - 12/23/22 1110     Visit Number 74    Number of Visits 92    Date for SLP Re-Evaluation 01/20/23    Authorization Type [employer]    Authorization Time Period 11/22/2022 thru 01/27/2023    Authorization - Visit Number 12    Authorization - Number of Visits 20    Progress Note Due on Visit 80    SLP Start Time 1100    SLP Stop Time  1200    SLP Time Calculation (min) 60 min    Activity Tolerance Patient tolerated treatment well             Past Medical History:  Diagnosis Date   CAD (coronary artery disease)    a. 10/2021 NSTEMI/PCI: LM nl, LAD min irregs, LCX 60m (2.5x18 Onyx Frontier DES), OM1 nl, RCA 45m (3.5x26 Onyx Frontier DES), 30d.   Diastolic dysfunction    a. 10/2021 Echo: EF 50-55%, no rwma, Gr1 DD, nl RV fxn. No significant valvular dzs.   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 SCCis. MOHs 07/08/22   Past Surgical History:  Procedure Laterality Date   COLONOSCOPY N/A 10/09/2015   Procedure: COLONOSCOPY;  Surgeon: Wallace Cullens, MD;  Location: [facility] ENDOSCOPY;  Service: Gastroenterology;  Laterality: N/A;   CORONARY STENT INTERVENTION N/A 10/22/2021   Procedure: CORONARY STENT INTERVENTION;  Surgeon: Iran Ouch, MD;  Location: [facility] INVASIVE CV LAB;  Service: Cardiovascular;  Laterality: N/A;   IR CT HEAD LTD  05/21/2022   IR PERCUTANEOUS ART THROMBECTOMY/INFUSION INTRACRANIAL INC DIAG ANGIO  05/21/2022   LEFT HEART CATH AND CORONARY ANGIOGRAPHY N/A 10/22/2021   Procedure: LEFT HEART CATH AND  CORONARY ANGIOGRAPHY;  Surgeon: Iran Ouch, MD;  Location: [facility] INVASIVE CV LAB;  Service: Cardiovascular;  Laterality: N/A;   LOOP RECORDER INSERTION N/A 05/23/2022   Procedure: LOOP RECORDER INSERTION;  Surgeon: Lanier Prude, MD;  Location: [facility] INVASIVE CV LAB;  Service: Cardiovascular;  Laterality: N/A;   RADIOLOGY WITH ANESTHESIA N/A 05/20/2022   Procedure: IR WITH ANESTHESIA;  Surgeon: Julieanne Cotton, MD;  Location: [facility] OR;  Service: Radiology;  Laterality: N/A;   ROTATOR CUFF REPAIR     Patient Active Problem List   Diagnosis Date Noted   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 male with PMH: HTN, CAD, HLD, osteoarthritis, SCC, sleep apnea. He presented to the hospital on 05/20/22 with right sided weakness and aphasia with LKW reported by wife to be 10pm on 10/2.  He arrived at [facility]  as code stroke and taken for emergent CT/CTA which demonstrated left M1 occlusion. He received IV tenecteplase and IR thrombectomy due to large vessel occlusion.       DIAGNOSTIC FINDINGS:  MRI 05/22/2022 Cortical ischemia throughout the left MCA territory compatible with recent infarct. Relatively little affect on the white matter. 2. Small focus of acute hemorrhage along the anterior surface of the left temporal lobe Heidelberg classification 3c: Subarachnoid hemorrhage. 3. Normal intracranial MRA.  Restored flow in left MCA.    THERAPY DIAG:  Aphasia  Middle cerebral artery embolism, left  Rationale for Evaluation and Treatment  Rehabilitation  SUBJECTIVE:   SUBJECTIVE STATEMENT: "He has been very stubborn, I haven't seen him that out of sorts in a long time" Pt accompanied by: self and significant other  PAIN:  Are you having pain? No  PATIENT GOALS: to be able to communicate again  OBJECTIVE:   TODAY'S TREATMENT: Skilled treatment session focused on pt's communication goals. SLP facilitated session by providing the following interventions:  Facilitating word finding thru written language Given a basic object, pt able to write down a 1 word description, with maximal questions, pt able to list 3 additional words to aid in description/word finding.  Pt's wife stated that pt has "trouble with the TV remote" - difficult to know if pt doesn't understand task of describing (adding more words to written description) or true word finding deficits manifesting itself in writing Spelling accuracy when writing self-generated words: With minimal assistance, pt able to sort letters into alphabetical order and with 2 cues needed.  Writing to diction, pt able to write down letters for nonsense words with 18/19 accuracy and words with 15/20 accuracy. Difficult to assess if pt understand that activity was targeting increased written output to aid in listener understanding     PATIENT EDUCATION: Education details: see above Person educated: Patient and Wife Education method: Explanation, Demonstration, Verbal cues, and Handouts Education comprehension: verbalized understanding  HOME EXERCISE PROGRAM:  Complete calendar Put lists in alphabetical order When writing down information, cue pt to write down additional words     GOALS: Goals reviewed with patient? Yes  SHORT TERM GOALS: Target date: 10 sessions  UPDATED 12/13/2022 - see previous notes for progress towards goals   3.   With minimal cues, pt will name set of 10 common objects in 8 out of 10 opportunities.  Goal status: MET; upgraded to: Pt will improve  wording finding abilities by writing 3 or more words during communication exchanges to increase listener understanding with moderate A in 5 out of 7 opportunities.  5. Pt will improve spelling ability by accurately writing sentence level description of his ADLs/iADLs with > 75% accuracy with Mod A cues.   Goal status: NOT MET; downgraded to: Pt will improve spelling ability by accurately spelling words during communication exchanges to increase listener understanding with min A in 5 out of 7 opportunities.  6.  Pt will read sentences related to his ADLs/iADLs with > 75% speech intelligibility and moderate assistance.    Goal Status: MET; upgraded to: Pt will read self-generated words during communication exchanges with > 50% given mod A.  7.  Pt will recognize and name lowercase letters in written text with >50% accuracy given maximal cues.  Goal status: discontinued d/t higher level receptive language deficits    LONG TERM GOALS: Target date: 01/20/2023   UPDATED 12/13/2022 - see previous notes for progress towards goals  Pt will use multimodal communication to express  basic wants/needs in 4 of 10 opportunities.  Baseline: unable Goal status: ONGOING    2.  Pt will demonstrate increased reading comprehension by selecting accurate answer from 3 choices in 8 out of 10 opportunities.  Goal status: ONGOING  ASSESSMENT:  CLINICAL IMPRESSION: Pt will noted difficulty understanding higher level abstract concepts as evidenced by his refusal to complete homework over the weekend. Pt with difficulty recalling incident as well.    OBJECTIVE IMPAIRMENTS include expressive language, receptive language, aphasia, and apraxia. These impairments are limiting patient from managing medications, managing appointments, managing finances, household responsibilities, ADLs/IADLs, and effectively communicating at home and in community. Factors affecting potential to achieve goals and functional outcome are severity  of impairments. Patient will benefit from skilled SLP services to address above impairments and improve overall function.  REHAB POTENTIAL: Excellent  PLAN: SLP FREQUENCY: 3x/week  SLP DURATION: 12 weeks  PLANNED INTERVENTIONS: Language facilitation, Environmental controls, Internal/external aids, Functional tasks, Multimodal communication approach, SLP instruction and feedback, Compensatory strategies, and Patient/family education    Sahirah Rudell B. Dreama Saa, M.S., CCC-SLP, Tree surgeon Certified Brain Injury Specialist [facility]  [facility] Rehabilitation Services Office [phone] Ascom [phone] Fax [phone]

## 2022-12-25 ENCOUNTER — Ambulatory Visit: Payer: Medicare HMO | Admitting: Speech Pathology

## 2022-12-25 DIAGNOSIS — R482 Apraxia: Secondary | ICD-10-CM

## 2022-12-25 DIAGNOSIS — R4701 Aphasia: Secondary | ICD-10-CM

## 2022-12-25 DIAGNOSIS — I6602 Occlusion and stenosis of left middle cerebral artery: Secondary | ICD-10-CM

## 2022-12-25 NOTE — Therapy (Signed)
OUTPATIENT SPEECH LANGUAGE PATHOLOGY TREATMENT NOTE     Patient Name: Alan Stewart MRN: [id number] DOB:May 12, 1946, 77 y.o., male Today's Date: 12/25/2022   PCP: Daniel Nones, MD REFERRING PROVIDER: Daniel Nones, MD   END OF SESSION  End of Session - 12/25/22 1008     Visit Number 75    Number of Visits 92    Date for SLP Re-Evaluation 01/20/23    Authorization Type [employer] H[employer]    Authorization Time Period 11/22/2022 thru 01/27/2023    Authorization - Visit Number 13    Authorization - Number of Visits 20    Progress Note Due on Visit 80    SLP Start Time 1000    SLP Stop Time  1100    SLP Time Calculation (min) 60 min    Activity Tolerance Patient tolerated treatment well             Past Medical History:  Diagnosis Date   CAD (coronary artery disease)    a. 10/2021 NSTEMI/PCI: LM nl, LAD min irregs, LCX 35m (2.5x18 Onyx Frontier DES), OM1 nl, RCA 47m (3.5x26 Onyx Frontier DES), 30d.   Diastolic dysfunction    a. 10/2021 Echo: EF 50-55%, no rwma, Gr1 DD, nl RV fxn. No significant valvular dzs.   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 SCCis. MOHs 07/08/22   Past Surgical History:  Procedure Laterality Date   COLONOSCOPY N/A 10/09/2015   Procedure: COLONOSCOPY;  Surgeon: Wallace Cullens, MD;  Location: [facility] ENDOSCOPY;  Service: Gastroenterology;  Laterality: N/A;   CORONARY STENT INTERVENTION N/A 10/22/2021   Procedure: CORONARY STENT INTERVENTION;  Surgeon: Iran Ouch, MD;  Location: [facility] INVASIVE CV LAB;  Service: Cardiovascular;  Laterality: N/A;   IR CT HEAD LTD  05/21/2022   IR PERCUTANEOUS ART THROMBECTOMY/INFUSION INTRACRANIAL INC DIAG ANGIO  05/21/2022   LEFT HEART CATH AND CORONARY ANGIOGRAPHY N/A 10/22/2021   Procedure: LEFT HEART CATH AND  CORONARY ANGIOGRAPHY;  Surgeon: Iran Ouch, MD;  Location: [facility] INVASIVE CV LAB;  Service: Cardiovascular;  Laterality: N/A;   LOOP RECORDER INSERTION N/A 05/23/2022   Procedure: LOOP RECORDER INSERTION;  Surgeon: Lanier Prude, MD;  Location: [facility] INVASIVE CV LAB;  Service: Cardiovascular;  Laterality: N/A;   RADIOLOGY WITH ANESTHESIA N/A 05/20/2022   Procedure: IR WITH ANESTHESIA;  Surgeon: Julieanne Cotton, MD;  Location: [facility] OR;  Service: Radiology;  Laterality: N/A;   ROTATOR CUFF REPAIR     Patient Active Problem List   Diagnosis Date Noted   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 male with PMH: HTN, CAD, HLD, osteoarthritis, SCC, sleep apnea. He presented to the hospital on 05/20/22 with right sided weakness and aphasia with LKW reported by wife to be 10pm on 10/2.  He arrived at [facility]  as code stroke and taken for emergent CT/CTA which demonstrated left M1 occlusion. He received IV tenecteplase and IR thrombectomy due to large vessel occlusion.       DIAGNOSTIC FINDINGS:  MRI 05/22/2022 Cortical ischemia throughout the left MCA territory compatible with recent infarct. Relatively little affect on the white matter. 2. Small focus of acute hemorrhage along the anterior surface of the left temporal lobe Heidelberg classification 3c: Subarachnoid hemorrhage. 3. Normal intracranial MRA.  Restored flow in left MCA.    THERAPY DIAG:  Aphasia  Apraxia  Middle cerebral artery embolism, left  Rationale for Evaluation and  Treatment Rehabilitation  SUBJECTIVE:   SUBJECTIVE STATEMENT: "He has been very stubborn, I haven't seen him that out of sorts in a long time" Pt accompanied by: self and significant other  PAIN:  Are you having pain? No  PATIENT GOALS: to be able to communicate again  OBJECTIVE:   TODAY'S TREATMENT: Skilled treatment session focused on pt's communication goals. SLP facilitated session by providing the following interventions:  Facilitating word finding thru written language Given a basic object, pt able to write down a 2 word description, with moderate questions, pt able to list 3 additional words to aid in description/word finding.  Improved speech intelligibility when saying names of TV shows Spelling accuracy when writing self-generated words: With minimal assistance, pt able to sort letters into alphabetical order and with 2 cues needed.  Writing to diction, pt able to write down letters for nonsense words with 28/34 accuracy     PATIENT EDUCATION: Education details: see above Person educated: Patient and Wife Education method: Explanation, Demonstration, Verbal cues, and Handouts Education comprehension: verbalized understanding  HOME EXERCISE PROGRAM:  Complete calendar Put lists in alphabetical order When writing down information, cue pt to write down additional words     GOALS: Goals reviewed with patient? Yes  SHORT TERM GOALS: Target date: 10 sessions  UPDATED 12/13/2022 - see previous notes for progress towards goals   3.   With minimal cues, pt will name set of 10 common objects in 8 out of 10 opportunities.  Goal status: MET; upgraded to: Pt will improve wording finding abilities by writing 3 or more words during communication exchanges to increase listener understanding with moderate A in 5 out of 7 opportunities.  5. Pt will improve spelling ability by accurately writing sentence level description of his ADLs/iADLs with > 75% accuracy with Mod A cues.    Goal status: NOT MET; downgraded to: Pt will improve spelling ability by accurately spelling words during communication exchanges to increase listener understanding with min A in 5 out of 7 opportunities.  6.  Pt will read sentences related to his ADLs/iADLs with > 75% speech intelligibility and moderate assistance.    Goal Status: MET; upgraded to: Pt will read self-generated words during communication exchanges with > 50% given mod A.  7.  Pt will recognize and name lowercase letters in written text with >50% accuracy given maximal cues.  Goal status: discontinued d/t higher level receptive language deficits    LONG TERM GOALS: Target date: 01/20/2023   UPDATED 12/13/2022 - see previous notes for progress towards goals  Pt will use multimodal communication to express basic wants/needs in 4 of 10 opportunities.  Baseline: unable Goal status: ONGOING    2.  Pt will demonstrate increased reading comprehension by selecting accurate answer from 3 choices in 8 out of 10 opportunities.  Goal status: ONGOING  ASSESSMENT:  CLINICAL IMPRESSION: Pt will noted  difficulty understanding higher level abstract concepts as evidenced by his refusal to complete homework over the weekend. Pt with difficulty recalling incident as well.    OBJECTIVE IMPAIRMENTS include expressive language, receptive language, aphasia, and apraxia. These impairments are limiting patient from managing medications, managing appointments, managing finances, household responsibilities, ADLs/IADLs, and effectively communicating at home and in community. Factors affecting potential to achieve goals and functional outcome are severity of impairments. Patient will benefit from skilled SLP services to address above impairments and improve overall function.  REHAB POTENTIAL: Excellent  PLAN: SLP FREQUENCY: 3x/week  SLP DURATION: 12 weeks  PLANNED INTERVENTIONS: Language facilitation, Environmental controls, Internal/external aids,  Functional tasks, Multimodal communication approach, SLP instruction and feedback, Compensatory strategies, and Patient/family education    Reniah Cottingham B. Dreama Saa, M.S., CCC-SLP, Tree surgeon Certified Brain Injury Specialist [facility]  [facility] Rehabilitation Services Office [phone] Ascom [phone] Fax [phone]

## 2022-12-26 ENCOUNTER — Ambulatory Visit (INDEPENDENT_AMBULATORY_CARE_PROVIDER_SITE_OTHER): Payer: Medicare HMO

## 2022-12-26 DIAGNOSIS — I63412 Cerebral infarction due to embolism of left middle cerebral artery: Secondary | ICD-10-CM

## 2022-12-26 LAB — CUP PACEART REMOTE DEVICE CHECK
Date Time Interrogation Session: 20240509072207
Implantable Pulse Generator Implant Date: 20231005
Pulse Gen Model: 450218
Pulse Gen Serial Number: 95054077

## 2022-12-27 ENCOUNTER — Ambulatory Visit: Payer: Medicare HMO | Admitting: Speech Pathology

## 2022-12-30 ENCOUNTER — Ambulatory Visit: Payer: Medicare HMO | Admitting: Speech Pathology

## 2022-12-31 ENCOUNTER — Ambulatory Visit: Payer: Medicare HMO | Admitting: Adult Health

## 2022-12-31 ENCOUNTER — Encounter: Payer: Self-pay | Admitting: Adult Health

## 2022-12-31 VITALS — BP 135/67 | HR 47 | Ht 67.0 in | Wt 254.6 lb

## 2022-12-31 DIAGNOSIS — I63512 Cerebral infarction due to unspecified occlusion or stenosis of left middle cerebral artery: Secondary | ICD-10-CM | POA: Diagnosis not present

## 2022-12-31 NOTE — Progress Notes (Signed)
[place] [employer] 

## 2022-12-31 NOTE — Progress Notes (Signed)
PATIENT: Alan Stewart DOB: November 03, 1945  REASON FOR VISIT: follow up HISTORY FROM: patient PRIMARY NEUROLOGIST: Dr. Pearlean Brownie  Chief Complaint  Patient presents with   Follow-up    Pt in 19 with wife  Pt here for Stroke f/u  Wife states that pt has Aphasia and takes Speech Therapy  3x weekly Wife wants to discuss Brilinta     HISTORY OF PRESENT ILLNESS: Today 12/31/22  Alan Stewart is a 77 y.o. male with a history of Stroke . Returns today for follow-up.  He is here today with his wife.  He remains on Brilinta and ASA.Has ST three times a week. No weakness in extremities. No change in gait or balance. Has loop recorder but has not been told of any arrhthymias.   06/19/22: Alan Stewart is a 77 y.o. 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 Comes in today for hospital FU.  He is here today with his wife.  Reports that he is having trouble with expressive aphasia.  He is in speech therapy 3 times a week. Denies any residual weakness.  Able to complete ADLs independently.  A loop recorder was placed in the hospital. Had sleep study through cardiology and he does have OSA and waiting to be treated.  Returns today for evaluation.   HISTORY Alan Stewart is a 77 y.o. male with history of hypertension, hyperlipidemia, CAD status post stent, OSA, obesity admitted for right-sided weakness and aphasia.  TNK given.  Status post IR for left terminal ICA and A1 and M1 occlusion. Loop recorder to be placed prior to discharge.     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 Per Dr. Corliss Skains, clot felt to be  cardioembolic source instead of ICAD MRI cortical ischemia throughout left MCA territory compatible with recent infarction. Small focus of acute hemorrhage along the anterior surface of the left temporal lobe Heidelberg classification 3c: Subarachnoid hemorrhage. MRA normal with restored flow in the left MCA 2D Echo EF 60 to 65% LE venous Doppler no DVT Loop recorder prior to discharge to rule out A-fib LDL 56 HgbA1c 6.0 UDS negative SCDs VTE prophylaxis aspirin 81 mg daily and Brilinta (ticagrelor) 90 mg bid prior to admission, on ASA and brilinta home meds.  Ongoing aggressive stroke risk factor management Therapy recommendations: outpatient PT, OT, ST Disposition: Pending  REVIEW OF SYSTEMS: Out of a complete 14 system review of symptoms, the patient complains only of the following symptoms, and all other reviewed systems are negative.  ALLERGIES: No Known Allergies  HOME MEDICATIONS: Outpatient Medications Prior to Visit  Medication Sig Dispense Refill   aspirin EC 81 MG EC tablet Take 1 tablet (81 mg total) by mouth daily. Swallow whole. 30 tablet 11   atorvastatin (LIPITOR) 80 MG tablet Take 1 tablet (80 mg total) by mouth daily. 90 tablet 3   carvedilol (COREG) 6.25 MG tablet TAKE ONE TABLET TWICE A DAY WITH MEALS 180 tablet 1   ezetimibe (ZETIA) 10 MG tablet TAKE 1 TABLET BY MOUTH DAILY 90 tablet 0   nitroGLYCERIN (NITROSTAT) 0.4 MG SL  tablet Place 1 tablet (0.4 mg total) under the tongue every 5 (five) minutes as needed for chest pain. 30 tablet 12   ticagrelor (BRILINTA) 90 MG TABS tablet TAKE 1 TABLET BY MOUTH TWICE DAILY 180 tablet 0   No facility-administered medications prior to visit.    PAST MEDICAL HISTORY: Past Medical History:  Diagnosis Date   CAD (coronary artery disease)    a. 10/2021 NSTEMI/PCI: LM nl, LAD min irregs, LCX 78m (2.5x18 Onyx Frontier DES), OM1 nl, RCA 20m (3.5x26 Onyx Frontier DES), 30d.   Diastolic dysfunction    a. 10/2021 Echo: EF 50-55%,  no rwma, Gr1 DD, nl RV fxn. No significant valvular dzs.   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 SCCis. MOHs 07/08/22    PAST SURGICAL HISTORY: Past Surgical History:  Procedure Laterality Date   COLONOSCOPY N/A 10/09/2015   Procedure: COLONOSCOPY;  Surgeon: Wallace Cullens, MD;  Location: [facility] ENDOSCOPY;  Service: Gastroenterology;  Laterality: N/A;   CORONARY STENT INTERVENTION N/A 10/22/2021   Procedure: CORONARY STENT INTERVENTION;  Surgeon: Iran Ouch, MD;  Location: [facility] INVASIVE CV LAB;  Service: Cardiovascular;  Laterality: N/A;   IR CT HEAD LTD  05/21/2022   IR PERCUTANEOUS ART THROMBECTOMY/INFUSION INTRACRANIAL INC DIAG ANGIO  05/21/2022   LEFT HEART CATH AND CORONARY ANGIOGRAPHY N/A 10/22/2021   Procedure: LEFT HEART CATH AND CORONARY ANGIOGRAPHY;  Surgeon: Iran Ouch, MD;  Location: [facility] INVASIVE CV LAB;  Service: Cardiovascular;  Laterality: N/A;   LOOP RECORDER INSERTION N/A 05/23/2022   Procedure: LOOP RECORDER INSERTION;  Surgeon: Lanier Prude, MD;  Location: [facility] INVASIVE CV LAB;  Service: Cardiovascular;  Laterality: N/A;   RADIOLOGY WITH ANESTHESIA N/A 05/20/2022   Procedure: IR WITH ANESTHESIA;  Surgeon: Julieanne Cotton, MD;  Location: [facility] OR;  Service: Radiology;  Laterality: N/A;   ROTATOR CUFF REPAIR      FAMILY HISTORY: Family History  Problem Relation Age of Onset   Cancer Sister    Stroke Neg Hx     SOCIAL HISTORY: Social History   Socioeconomic History   Marital status: Married    Spouse name: Not on file   Number of children: Not on file   Years of education: Not on file   Highest education level: Not on file  Occupational History   Not on file  Tobacco Use   Smoking status: Former    Packs/day: 1.50    Years: 10.00     Additional pack years: 0.00    Total pack years: 15.00    Types: Cigarettes    Quit date: 07/19/1990    Years since quitting: 32.4   Smokeless tobacco: Former  Building services engineer Use: Never used  Substance and Sexual Activity   Alcohol use: Not Currently   Drug use: No   Sexual activity: Not on file  Other Topics Concern   Not on file  Social History Narrative   Not on file   Social Determinants of Health   Financial Resource Strain: Not on file  Food Insecurity: Not on file  Transportation Needs: Not on file  Physical Activity: Not on file  Stress: Not on file  Social Connections: Not on file  Intimate Partner Violence: Not  on file      PHYSICAL EXAM  Vitals:   12/31/22 1131  BP: 135/67  Pulse: (!) 47  Weight: 254 lb 9.6 oz (115.5 kg)  Height: 5\' 7"  (1.702 m)   Body mass index is 39.88 kg/m.  Generalized: Well developed, in no acute distress   Neurological examination  Mentation: Alert oriented to time, place, history taking. Follows all commands.  Expressive aphasia Cranial nerve II-XII: Pupils were equal round reactive to light. Extraocular movements were full, visual field were full on confrontational test. Facial sensation and strength were normal.  Head turning and shoulder shrug  were normal and symmetric. Motor: The motor testing reveals 5 over 5 strength of all 4 extremities. Good symmetric motor tone is noted throughout.  Sensory: Sensory testing is intact to soft touch on all 4 extremities. No evidence of extinction is noted.  Coordination: Cerebellar testing reveals good finger-nose-finger and heel-to-shin bilaterally.  Gait and station: Gait is normal.    DIAGNOSTIC DATA (LABS, IMAGING, TESTING) - I reviewed patient records, labs, notes, testing and imaging myself where available.  Lab Results  Component Value Date   WBC 7.2 05/21/2022   HGB 13.4 05/21/2022   HCT 39.8 05/21/2022   MCV 90.0 05/21/2022   PLT 207 05/21/2022      Component  Value Date/Time   NA 138 05/21/2022 0433   K 4.0 05/21/2022 0433   CL 110 05/21/2022 0433   CO2 22 05/21/2022 0433   GLUCOSE 195 (H) 05/21/2022 0433   BUN 15 05/21/2022 0433   CREATININE 1.08 05/21/2022 0433   CALCIUM 8.3 (L) 05/21/2022 0433   PROT 6.1 (L) 05/20/2022 2315   ALBUMIN 3.3 (L) 05/20/2022 2315   AST 29 05/20/2022 2315   ALT 30 05/20/2022 2315   ALKPHOS 105 05/20/2022 2315   BILITOT 0.6 05/20/2022 2315   GFRNONAA >60 05/21/2022 0433   GFRAA >60 05/29/2017 1016   Lab Results  Component Value Date   CHOL 106 05/21/2022   HDL 33 (L) 05/21/2022   LDLCALC 56 05/21/2022   TRIG 83 05/21/2022   CHOLHDL 3.2 05/21/2022   Lab Results  Component Value Date   HGBA1C 6.0 (H) 05/21/2022   No results found for: "VITAMINB12" Lab Results  Component Value Date   TSH 2.044 10/20/2021      ASSESSMENT AND PLAN 77 y.o. year old male  has a past medical history of CAD (coronary artery disease), Diastolic dysfunction, Hyperglycemia, Hyperlipidemia, Hypertension, Morbid obesity (HCC), Osteoarthritis, SCC (squamous cell carcinoma) (05/16/2022), Sleep apnea, and Squamous cell carcinoma in situ of skin (06/04/2022). 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  Discussed secondary stroke prevention measures and importance of close PCP follow up for aggressive stroke risk factor management. I have gone over the pathophysiology of stroke, warning signs and symptoms, risk factors and their management in some detail with instructions to go to the closest emergency room for symptoms of concern. HTN: BP goal <130/90.   HLD: LDL goal <70. Recent LDL 56. On lipitor and Zetia DMII: A1c goal<7.0. Recent A1c 6.0.  Encouraged patient to monitor diet and encouraged  exercise FU PRN  Butch Penny, MSN, NP-C 12/31/2022, 11:32 AM [facility] [address], [address] [place], [place] [address] [phone]

## 2023-01-01 ENCOUNTER — Ambulatory Visit: Payer: Medicare HMO | Admitting: Speech Pathology

## 2023-01-01 DIAGNOSIS — R4701 Aphasia: Secondary | ICD-10-CM | POA: Diagnosis not present

## 2023-01-01 DIAGNOSIS — R482 Apraxia: Secondary | ICD-10-CM | POA: Diagnosis not present

## 2023-01-01 DIAGNOSIS — I6602 Occlusion and stenosis of left middle cerebral artery: Secondary | ICD-10-CM

## 2023-01-01 NOTE — Therapy (Signed)
OUTPATIENT SPEECH LANGUAGE PATHOLOGY TREATMENT NOTE     Patient Name: Alan Stewart MRN: [id number] DOB:Oct 28, 1945, 77 y.o., male Today's Date: 01/01/2023   PCP: Daniel Nones, MD REFERRING PROVIDER: Daniel Nones, MD   END OF SESSION  End of Session - 01/01/23 0958     Visit Number 76    Number of Visits 92    Date for SLP Re-Evaluation 01/20/23    Authorization Type [employer] H[employer]    Authorization Time Period 11/22/2022 thru 01/27/2023    Authorization - Visit Number 14    Authorization - Number of Visits 20    Progress Note Due on Visit 80    SLP Start Time 1000    SLP Stop Time  1100    SLP Time Calculation (min) 60 min    Activity Tolerance Patient tolerated treatment well             Past Medical History:  Diagnosis Date   CAD (coronary artery disease)    a. 10/2021 NSTEMI/PCI: LM nl, LAD min irregs, LCX 37m (2.5x18 Onyx Frontier DES), OM1 nl, RCA 29m (3.5x26 Onyx Frontier DES), 30d.   Diastolic dysfunction    a. 10/2021 Echo: EF 50-55%, no rwma, Gr1 DD, nl RV fxn. No significant valvular dzs.   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 SCCis. MOHs 07/08/22   Past Surgical History:  Procedure Laterality Date   COLONOSCOPY N/A 10/09/2015   Procedure: COLONOSCOPY;  Surgeon: Wallace Cullens, MD;  Location: [facility] ENDOSCOPY;  Service: Gastroenterology;  Laterality: N/A;   CORONARY STENT INTERVENTION N/A 10/22/2021   Procedure: CORONARY STENT INTERVENTION;  Surgeon: Iran Ouch, MD;  Location: [facility] INVASIVE CV LAB;  Service: Cardiovascular;  Laterality: N/A;   IR CT HEAD LTD  05/21/2022   IR PERCUTANEOUS ART THROMBECTOMY/INFUSION INTRACRANIAL INC DIAG ANGIO  05/21/2022   LEFT HEART CATH AND CORONARY ANGIOGRAPHY N/A 10/22/2021   Procedure: LEFT HEART CATH AND  CORONARY ANGIOGRAPHY;  Surgeon: Iran Ouch, MD;  Location: [facility] INVASIVE CV LAB;  Service: Cardiovascular;  Laterality: N/A;   LOOP RECORDER INSERTION N/A 05/23/2022   Procedure: LOOP RECORDER INSERTION;  Surgeon: Lanier Prude, MD;  Location: [facility] INVASIVE CV LAB;  Service: Cardiovascular;  Laterality: N/A;   RADIOLOGY WITH ANESTHESIA N/A 05/20/2022   Procedure: IR WITH ANESTHESIA;  Surgeon: Julieanne Cotton, MD;  Location: [facility] OR;  Service: Radiology;  Laterality: N/A;   ROTATOR CUFF REPAIR     Patient Active Problem List   Diagnosis Date Noted   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 male with PMH: HTN, CAD, HLD, osteoarthritis, SCC, sleep apnea. He presented to the hospital on 05/20/22 with right sided weakness and aphasia with LKW reported by wife to be 10pm on 10/2.  He arrived at [facility]  as code stroke and taken for emergent CT/CTA which demonstrated left M1 occlusion. He received IV tenecteplase and IR thrombectomy due to large vessel occlusion.       DIAGNOSTIC FINDINGS:  MRI 05/22/2022 Cortical ischemia throughout the left MCA territory compatible with recent infarct. Relatively little affect on the white matter. 2. Small focus of acute hemorrhage along the anterior surface of the left temporal lobe Heidelberg classification 3c: Subarachnoid hemorrhage. 3. Normal intracranial MRA.  Restored flow in left MCA.    THERAPY DIAG:  Aphasia  Middle cerebral artery embolism, left  Rationale for Evaluation and Treatment  Rehabilitation  SUBJECTIVE:   SUBJECTIVE STATEMENT: "He is talking more" no more visits to neurology Pt accompanied by: self and significant other  PAIN:  Are you having pain? No  PATIENT GOALS: to be able to communicate again  OBJECTIVE:   TODAY'S TREATMENT: Skilled treatment session focused on pt's communication goals. SLP facilitated session by providing the following interventions:  Facilitating word finding thru written language Given a basic topic (golf tournament this past weekend), pt able to write down a 2 word description, with moderate questions, pt able to list 3 additional words to aid in description/word finding.  Improved speech intelligibility when saying names of TV shows Spelling accuracy when writing self-generated words: Writing to diction, pt able to write down letters for nonsense words with 12/15 accuracy; writing common phrases to diction - with min A pt able to write in 2/3 opportunities Reading accuracy: audio playback helpful for improving motor movement    PATIENT EDUCATION: Education details: see above Person educated: Patient and Wife Education method: Programmer, multimedia, Facilities manager, Verbal cues, and Handouts Education comprehension: verbalized understanding  HOME EXERCISE PROGRAM:  Complete calendar Put lists in alphabetical order When writing down information, cue pt to write down additional words     GOALS: Goals reviewed with patient? Yes  SHORT TERM GOALS: Target date: 10 sessions  UPDATED 12/13/2022 - see previous notes for progress towards goals   3.   With minimal cues, pt will name set of 10 common objects in 8 out of 10 opportunities.  Goal status: MET; upgraded to: Pt will improve wording finding abilities by writing 3 or more words during communication exchanges to increase listener understanding with moderate A in 5 out of 7 opportunities.  5. Pt will improve spelling ability by accurately writing sentence level description of his  ADLs/iADLs with > 75% accuracy with Mod A cues.   Goal status: NOT MET; downgraded to: Pt will improve spelling ability by accurately spelling words during communication exchanges to increase listener understanding with min A in 5 out of 7 opportunities.  6.  Pt will read sentences related to his ADLs/iADLs with > 75% speech intelligibility and moderate assistance.    Goal Status: MET; upgraded to: Pt will read self-generated words during communication exchanges with > 50% given mod A.  7.  Pt will recognize and name lowercase letters in written text with >50% accuracy given maximal cues.  Goal status: discontinued d/t higher level receptive language deficits    LONG TERM GOALS: Target date: 01/20/2023   UPDATED 12/13/2022 - see previous notes for progress towards goals  Pt will use multimodal communication to express basic wants/needs in 4 of 10 opportunities.  Baseline: unable Goal status: ONGOING    2.  Pt will demonstrate increased reading comprehension by selecting accurate answer from 3 choices in 8 out of 10 opportunities.  Goal status: ONGOING  ASSESSMENT:  CLINICAL IMPRESSION: Pt  continues to be motivated and requires less assistance to write down information during this session.    OBJECTIVE IMPAIRMENTS include expressive language, receptive language, aphasia, and apraxia. These impairments are limiting patient from managing medications, managing appointments, managing finances, household responsibilities, ADLs/IADLs, and effectively communicating at home and in community. Factors affecting potential to achieve goals and functional outcome are severity of impairments. Patient will benefit from skilled SLP services to address above impairments and improve overall function.  REHAB POTENTIAL: Excellent  PLAN: SLP FREQUENCY: 3x/week  SLP DURATION: 12 weeks  PLANNED INTERVENTIONS: Language facilitation, Environmental controls, Internal/external aids, Functional tasks, Multimodal  communication approach, SLP instruction and feedback, Compensatory strategies, and Patient/family education    Angelena Sand B. Dreama Saa, M.S., CCC-SLP, Tree surgeon Certified Brain Injury Specialist [facility]  [facility] Office [phone] Ascom [phone] Fax [phone]

## 2023-01-03 ENCOUNTER — Ambulatory Visit: Payer: Medicare HMO | Admitting: Speech Pathology

## 2023-01-03 DIAGNOSIS — R482 Apraxia: Secondary | ICD-10-CM | POA: Diagnosis not present

## 2023-01-03 DIAGNOSIS — R4701 Aphasia: Secondary | ICD-10-CM

## 2023-01-03 DIAGNOSIS — I6602 Occlusion and stenosis of left middle cerebral artery: Secondary | ICD-10-CM | POA: Diagnosis not present

## 2023-01-06 ENCOUNTER — Ambulatory Visit: Payer: Medicare HMO | Admitting: Speech Pathology

## 2023-01-06 NOTE — Therapy (Signed)
OUTPATIENT SPEECH LANGUAGE PATHOLOGY TREATMENT NOTE     Patient Name: Alan Stewart MRN: [id number] DOB:[birth year], 77 y.o., male Today's Date: 01/03/2023   PCP: Daniel Nones, MD REFERRING PROVIDER: Daniel Nones, MD   END OF SESSION  End of Session - 01/03/23 1240     Visit Number 77    Number of Visits 92    Date for SLP Re-Evaluation 01/20/23    Authorization Type [employer] H[employer]    Authorization Time Period 11/22/2022 thru 01/27/2023    Authorization - Visit Number 15    Authorization - Number of Visits 20    Progress Note Due on Visit 80    SLP Start Time 1000    SLP Stop Time  1100    SLP Time Calculation (min) 60 min    Activity Tolerance Patient tolerated treatment well             Past Medical History:  Diagnosis Date   CAD (coronary artery disease)    a. 10/2021 NSTEMI/PCI: LM nl, LAD min irregs, LCX 62m (2.5x18 Onyx Frontier DES), OM1 nl, RCA 18m (3.5x26 Onyx Frontier DES), 30d.   Diastolic dysfunction    a. 10/2021 Echo: EF 50-55%, no rwma, Gr1 DD, nl RV fxn. No significant valvular dzs.   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 SCCis. MOHs 07/08/22   Past Surgical History:  Procedure Laterality Date   COLONOSCOPY N/A 10/09/2015   Procedure: COLONOSCOPY;  Surgeon: Wallace Cullens, MD;  Location: [facility] ENDOSCOPY;  Service: Gastroenterology;  Laterality: N/A;   CORONARY STENT INTERVENTION N/A 10/22/2021   Procedure: CORONARY STENT INTERVENTION;  Surgeon: Iran Ouch, MD;  Location: [facility] INVASIVE CV LAB;  Service: Cardiovascular;  Laterality: N/A;   IR CT HEAD LTD  05/21/2022   IR PERCUTANEOUS ART THROMBECTOMY/INFUSION INTRACRANIAL INC DIAG ANGIO  05/21/2022   LEFT HEART CATH AND CORONARY ANGIOGRAPHY N/A 10/22/2021   Procedure: LEFT HEART CATH AND  CORONARY ANGIOGRAPHY;  Surgeon: Iran Ouch, MD;  Location: [facility] INVASIVE CV LAB;  Service: Cardiovascular;  Laterality: N/A;   LOOP RECORDER INSERTION N/A 05/23/2022   Procedure: LOOP RECORDER INSERTION;  Surgeon: Lanier Prude, MD;  Location: [facility] INVASIVE CV LAB;  Service: Cardiovascular;  Laterality: N/A;   RADIOLOGY WITH ANESTHESIA N/A 05/20/2022   Procedure: IR WITH ANESTHESIA;  Surgeon: Julieanne Cotton, MD;  Location: [facility] OR;  Service: Radiology;  Laterality: N/A;   ROTATOR CUFF REPAIR     Patient Active Problem List   Diagnosis Date Noted   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 male with PMH: HTN, CAD, HLD, osteoarthritis, SCC, sleep apnea. He presented to the hospital on 05/20/22 with right sided weakness and aphasia with LKW reported by wife to be 10pm on 10/2.  He arrived at [facility]  as code stroke and taken for emergent CT/CTA which demonstrated left M1 occlusion. He received IV tenecteplase and IR thrombectomy due to large vessel occlusion.       DIAGNOSTIC FINDINGS:  MRI 05/22/2022 Cortical ischemia throughout the left MCA territory compatible with recent infarct. Relatively little affect on the white matter. 2. Small focus of acute hemorrhage along the anterior surface of the left temporal lobe Heidelberg classification 3c: Subarachnoid hemorrhage. 3. Normal intracranial MRA.  Restored flow in left MCA.    THERAPY DIAG:  Aphasia  Apraxia  Middle cerebral artery embolism, left  Rationale for Evaluation and  Treatment Rehabilitation  SUBJECTIVE:   SUBJECTIVE STATEMENT: "He is talking more" no more visits to neurology Pt accompanied by: self and significant other  PAIN:  Are you having pain? No  PATIENT GOALS: to be able to communicate again  OBJECTIVE:   TODAY'S TREATMENT: Skilled treatment session focused on pt's communication goals. SLP facilitated session by providing the following interventions:  Facilitating word finding thru written language Given a basic topic (golf tournament this past weekend), pt able to write down a 2 word description, with moderate questions, pt able to list 3 additional words to aid in description/word finding.  Spelling accuracy when writing self-generated words: Writing to diction, pt able to write down letters for nonsense words with 12/15 accuracy; writing common phrases to diction - with min A pt able to write in 2/3 opportunities Reading accuracy: motor movement improved overall today    PATIENT EDUCATION: Education details: see above Person educated: Patient and Wife Education method: Explanation, Demonstration, Verbal cues, and Handouts Education comprehension: verbalized understanding  HOME EXERCISE PROGRAM:  Complete calendar Put lists in alphabetical order When writing down information, cue pt to write down additional words     GOALS: Goals reviewed with patient? Yes  SHORT TERM GOALS: Target date: 10 sessions  UPDATED 12/13/2022 - see previous notes for progress towards goals   3.   With minimal cues, pt will name set of 10 common objects in 8 out of 10 opportunities.  Goal status: MET; upgraded to: Pt will improve wording finding abilities by writing 3 or more words during communication exchanges to increase listener understanding with moderate A in 5 out of 7 opportunities.  5. Pt will improve spelling ability by accurately writing sentence level description of his ADLs/iADLs with > 75% accuracy with Mod A cues.   Goal status: NOT  MET; downgraded to: Pt will improve spelling ability by accurately spelling words during communication exchanges to increase listener understanding with min A in 5 out of 7 opportunities.  6.  Pt will read sentences related to his ADLs/iADLs with > 75% speech intelligibility and moderate assistance.    Goal Status: MET; upgraded to: Pt will read self-generated words during communication exchanges with > 50% given mod A.  7.  Pt will recognize and name lowercase letters in written text with >50% accuracy given maximal cues.  Goal status: discontinued d/t higher level receptive language deficits    LONG TERM GOALS: Target date: 01/20/2023   UPDATED 12/13/2022 - see previous notes for progress towards goals  Pt will use multimodal communication to express basic wants/needs in 4 of 10 opportunities.  Baseline: unable Goal status: ONGOING    2.  Pt will demonstrate increased reading comprehension by selecting accurate answer from 3 choices in 8 out of 10 opportunities.  Goal status: ONGOING  ASSESSMENT:  CLINICAL IMPRESSION: Pt continues to be motivated and requires less assistance to  write down information during this session.    OBJECTIVE IMPAIRMENTS include expressive language, receptive language, aphasia, and apraxia. These impairments are limiting patient from managing medications, managing appointments, managing finances, household responsibilities, ADLs/IADLs, and effectively communicating at home and in community. Factors affecting potential to achieve goals and functional outcome are severity of impairments. Patient will benefit from skilled SLP services to address above impairments and improve overall function.  REHAB POTENTIAL: Excellent  PLAN: SLP FREQUENCY: 3x/week  SLP DURATION: 12 weeks  PLANNED INTERVENTIONS: Language facilitation, Environmental controls, Internal/external aids, Functional tasks, Multimodal communication approach, SLP instruction and feedback, Compensatory  strategies, and Patient/family education    Merl Guardino B. Dreama Saa, M.S., CCC-SLP, Tree surgeon Certified Brain Injury Specialist [facility]  [facility] Rehabilitation Services Office [phone] Ascom [phone] Fax [phone]

## 2023-01-08 ENCOUNTER — Ambulatory Visit: Payer: Medicare HMO | Admitting: Speech Pathology

## 2023-01-08 NOTE — Telephone Encounter (Signed)
Left voicemail to schedule past due follow up appointment 

## 2023-01-10 ENCOUNTER — Ambulatory Visit: Payer: Medicare HMO | Admitting: Speech Pathology

## 2023-01-10 DIAGNOSIS — I6602 Occlusion and stenosis of left middle cerebral artery: Secondary | ICD-10-CM

## 2023-01-10 DIAGNOSIS — R482 Apraxia: Secondary | ICD-10-CM

## 2023-01-10 DIAGNOSIS — R4701 Aphasia: Secondary | ICD-10-CM

## 2023-01-10 NOTE — Therapy (Signed)
OUTPATIENT SPEECH LANGUAGE PATHOLOGY TREATMENT NOTE     Patient Name: Alan Stewart MRN: [id number] DOB:Sep 04, 1945, 77 y.o., male Today's Date: 01/10/2023   PCP: Daniel Nones, MD REFERRING PROVIDER: Daniel Nones, MD   END OF SESSION   End of Session - 01/10/23 1009     Visit Number 78    Number of Visits 92    Date for SLP Re-Evaluation 01/20/23    Authorization Type [employer] H[employer]    Authorization Time Period 11/22/2022 thru 01/27/2023    Authorization - Visit Number 16    Authorization - Number of Visits 20    Progress Note Due on Visit 80    SLP Start Time 1000    SLP Stop Time  1100    SLP Time Calculation (min) 60 min    Activity Tolerance Patient tolerated treatment well               Past Medical History:  Diagnosis Date   CAD (coronary artery disease)    a. 10/2021 NSTEMI/PCI: LM nl, LAD min irregs, LCX 38m (2.5x18 Onyx Frontier DES), OM1 nl, RCA 63m (3.5x26 Onyx Frontier DES), 30d.   Diastolic dysfunction    a. 10/2021 Echo: EF 50-55%, no rwma, Gr1 DD, nl RV fxn. No significant valvular dzs.   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 SCCis. MOHs 07/08/22   Past Surgical History:  Procedure Laterality Date   COLONOSCOPY N/A 10/09/2015   Procedure: COLONOSCOPY;  Surgeon: Wallace Cullens, MD;  Location: [facility] ENDOSCOPY;  Service: Gastroenterology;  Laterality: N/A;   CORONARY STENT INTERVENTION N/A 10/22/2021   Procedure: CORONARY STENT INTERVENTION;  Surgeon: Iran Ouch, MD;  Location: [facility] INVASIVE CV LAB;  Service: Cardiovascular;  Laterality: N/A;   IR CT HEAD LTD  05/21/2022   IR PERCUTANEOUS ART THROMBECTOMY/INFUSION INTRACRANIAL INC DIAG ANGIO  05/21/2022   LEFT HEART CATH AND CORONARY ANGIOGRAPHY N/A 10/22/2021   Procedure: LEFT HEART CATH  AND CORONARY ANGIOGRAPHY;  Surgeon: Iran Ouch, MD;  Location: [facility] INVASIVE CV LAB;  Service: Cardiovascular;  Laterality: N/A;   LOOP RECORDER INSERTION N/A 05/23/2022   Procedure: LOOP RECORDER INSERTION;  Surgeon: Lanier Prude, MD;  Location: [facility] INVASIVE CV LAB;  Service: Cardiovascular;  Laterality: N/A;   RADIOLOGY WITH ANESTHESIA N/A 05/20/2022   Procedure: IR WITH ANESTHESIA;  Surgeon: Julieanne Cotton, MD;  Location: [facility] OR;  Service: Radiology;  Laterality: N/A;   ROTATOR CUFF REPAIR     Patient Active Problem List   Diagnosis Date Noted   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 male with PMH: HTN, CAD, HLD, osteoarthritis, SCC, sleep apnea. He presented to the hospital on 05/20/22 with right sided weakness and aphasia with LKW reported by wife to be 10pm on 10/2.  He arrived  at [facility] as code stroke and taken for emergent CT/CTA which demonstrated left M1 occlusion. He received IV tenecteplase and IR thrombectomy due to large vessel occlusion.       DIAGNOSTIC FINDINGS:  MRI 05/22/2022 Cortical ischemia throughout the left MCA territory compatible with recent infarct. Relatively little affect on the white matter. 2. Small focus of acute hemorrhage along the anterior surface of the left temporal lobe Heidelberg classification 3c: Subarachnoid hemorrhage. 3. Normal intracranial MRA.  Restored flow in left MCA.    THERAPY DIAG:  Aphasia  Apraxia  Middle cerebral artery embolism, left  Rationale for Evaluation  and Treatment Rehabilitation  SUBJECTIVE:   SUBJECTIVE STATEMENT: Pt's wife continues to report that pt is talking more - "he doesn't realize when something comes out correctly" Pt accompanied by: self and significant other  PAIN:  Are you having pain? No  PATIENT GOALS: to be able to communicate again  OBJECTIVE:   TODAY'S TREATMENT: Skilled treatment session focused on pt's communication goals. SLP facilitated session by providing the following interventions:  Facilitating word finding thru written language Given a basic topic (golf tournament this past weekend), pt able to write down a 1 word responses, that were not increased with maximal multimodal cues  Spelling accuracy when writing self-generated words: Writing to diction, pt able to write down letters for nonsense words with 12/15 accuracy; writing common phrases to diction - with min A pt able to write in 2/3 opportunities Reading accuracy: motor movement improved overall today but reading comprehension and receptive language abilities appeared reduced as pt required maximal written fill-in the blank sentences to promote understanding and word finding    PATIENT EDUCATION: Education details: see above Person educated: Patient and Wife Education method: Programmer, multimedia, Demonstration, Verbal cues, and Handouts Education comprehension: verbalized understanding  HOME EXERCISE PROGRAM:  Complete calendar Put lists in alphabetical order When writing down information, cue pt to write down additional words     GOALS: Goals reviewed with patient? Yes  SHORT TERM GOALS: Target date: 10 sessions  UPDATED 12/13/2022 - see previous notes for progress towards goals   3.   With minimal cues, pt will name set of 10 common objects in 8 out of 10 opportunities.  Goal status: MET; upgraded to: Pt will improve wording finding abilities by writing 3 or more words during communication exchanges to increase listener understanding with  moderate A in 5 out of 7 opportunities.  5. Pt will improve spelling ability by accurately writing sentence level description of his ADLs/iADLs with > 75% accuracy with Mod A cues.   Goal status: NOT MET; downgraded to: Pt will improve spelling ability by accurately spelling words during communication exchanges to increase listener understanding with min A in 5 out of 7 opportunities.  6.  Pt will read sentences related to his ADLs/iADLs with > 75% speech intelligibility and moderate assistance.    Goal Status: MET; upgraded to: Pt will read self-generated words during communication exchanges with > 50% given mod A.  7.  Pt will recognize and name lowercase letters in written text with >50% accuracy given maximal cues.  Goal status: discontinued d/t higher level receptive language deficits    LONG TERM GOALS: Target date: 01/20/2023   UPDATED 12/13/2022 - see previous notes for progress towards goals  Pt will use multimodal communication to express basic wants/needs in 4 of 10 opportunities.  Baseline: unable Goal status: ONGOING    2.  Pt will demonstrate increased reading comprehension by selecting  accurate answer from 3 choices in 8 out of 10 opportunities.  Goal status: ONGOING  ASSESSMENT:  CLINICAL IMPRESSION: Pt continues to be motivated and requires less assistance to write down information during this session.    OBJECTIVE IMPAIRMENTS include expressive language, receptive language, aphasia, and apraxia. These impairments are limiting patient from managing medications, managing appointments, managing finances, household responsibilities, ADLs/IADLs, and effectively communicating at home and in community. Factors affecting potential to achieve goals and functional outcome are severity of impairments. Patient will benefit from skilled SLP services to address above impairments and improve overall function.  REHAB POTENTIAL: Excellent  PLAN: SLP FREQUENCY: 3x/week  SLP DURATION:  12 weeks  PLANNED INTERVENTIONS: Language facilitation, Environmental controls, Internal/external aids, Functional tasks, Multimodal communication approach, SLP instruction and feedback, Compensatory strategies, and Patient/family education    Nereyda Bowler B. Dreama Saa, M.S., CCC-SLP, Tree surgeon Certified Brain Injury Specialist [facility]  [facility] Rehabilitation Services Office [phone] Ascom [phone] Fax [phone]

## 2023-01-14 NOTE — Progress Notes (Signed)
[facility] Summary Report / Loop Recorder 

## 2023-01-15 ENCOUNTER — Ambulatory Visit: Payer: Medicare HMO | Admitting: Speech Pathology

## 2023-01-15 DIAGNOSIS — R482 Apraxia: Secondary | ICD-10-CM

## 2023-01-15 DIAGNOSIS — I6602 Occlusion and stenosis of left middle cerebral artery: Secondary | ICD-10-CM | POA: Diagnosis not present

## 2023-01-15 DIAGNOSIS — R4701 Aphasia: Secondary | ICD-10-CM

## 2023-01-16 NOTE — Therapy (Signed)
OUTPATIENT SPEECH LANGUAGE PATHOLOGY TREATMENT NOTE     Patient Name: Alan Stewart MRN: [id number] DOB:1945/12/19, 77 y.o., male Today's Date: 01/15/2023   PCP: Daniel Nones, MD REFERRING PROVIDER: Daniel Nones, MD   END OF SESSION   End of Session - 01/15/23 0855     Visit Number 79    Number of Visits 92    Date for SLP Re-Evaluation 01/20/23    Authorization Type [employer] H[employer]    Authorization Time Period 11/22/2022 thru 01/27/2023    Authorization - Visit Number 17    Authorization - Number of Visits 20    Progress Note Due on Visit 80    SLP Start Time 1000    SLP Stop Time  1100    SLP Time Calculation (min) 60 min    Activity Tolerance Patient tolerated treatment well               Past Medical History:  Diagnosis Date   CAD (coronary artery disease)    a. 10/2021 NSTEMI/PCI: LM nl, LAD min irregs, LCX 25m (2.5x18 Onyx Frontier DES), OM1 nl, RCA 66m (3.5x26 Onyx Frontier DES), 30d.   Diastolic dysfunction    a. 10/2021 Echo: EF 50-55%, no rwma, Gr1 DD, nl RV fxn. No significant valvular dzs.   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 SCCis. MOHs 07/08/22   Past Surgical History:  Procedure Laterality Date   COLONOSCOPY N/A 10/09/2015   Procedure: COLONOSCOPY;  Surgeon: Wallace Cullens, MD;  Location: [facility] ENDOSCOPY;  Service: Gastroenterology;  Laterality: N/A;   CORONARY STENT INTERVENTION N/A 10/22/2021   Procedure: CORONARY STENT INTERVENTION;  Surgeon: Iran Ouch, MD;  Location: [facility] INVASIVE CV LAB;  Service: Cardiovascular;  Laterality: N/A;   IR CT HEAD LTD  05/21/2022   IR PERCUTANEOUS ART THROMBECTOMY/INFUSION INTRACRANIAL INC DIAG ANGIO  05/21/2022   LEFT HEART CATH AND CORONARY ANGIOGRAPHY N/A 10/22/2021   Procedure: LEFT HEART CATH  AND CORONARY ANGIOGRAPHY;  Surgeon: Iran Ouch, MD;  Location: [facility] INVASIVE CV LAB;  Service: Cardiovascular;  Laterality: N/A;   LOOP RECORDER INSERTION N/A 05/23/2022   Procedure: LOOP RECORDER INSERTION;  Surgeon: Lanier Prude, MD;  Location: [facility] INVASIVE CV LAB;  Service: Cardiovascular;  Laterality: N/A;   RADIOLOGY WITH ANESTHESIA N/A 05/20/2022   Procedure: IR WITH ANESTHESIA;  Surgeon: Julieanne Cotton, MD;  Location: [facility] OR;  Service: Radiology;  Laterality: N/A;   ROTATOR CUFF REPAIR     Patient Active Problem List   Diagnosis Date Noted   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 male with PMH: HTN, CAD, HLD, osteoarthritis, SCC, sleep apnea. He presented to the hospital on 05/20/22 with right sided weakness and aphasia with LKW reported by wife to be 10pm on 10/2.  He arrived  at [facility] as code stroke and taken for emergent CT/CTA which demonstrated left M1 occlusion. He received IV tenecteplase and IR thrombectomy due to large vessel occlusion.       DIAGNOSTIC FINDINGS:  MRI 05/22/2022 Cortical ischemia throughout the left MCA territory compatible with recent infarct. Relatively little affect on the white matter. 2. Small focus of acute hemorrhage along the anterior surface of the left temporal lobe Heidelberg classification 3c: Subarachnoid hemorrhage. 3. Normal intracranial MRA.  Restored flow in left MCA.    THERAPY DIAG:  Aphasia  Apraxia  Middle cerebral artery embolism, left  Rationale for Evaluation  and Treatment Rehabilitation  SUBJECTIVE:   SUBJECTIVE STATEMENT: Pt's wife continues to report that pt is talking more - "he doesn't realize when something comes out correctly" Pt accompanied by: self and significant other  PAIN:  Are you having pain? No  PATIENT GOALS: to be able to communicate again  OBJECTIVE:   TODAY'S TREATMENT: Skilled treatment session focused on pt's communication goals. SLP facilitated session by providing the following interventions:  With maximal written multiple choice responses, pt able to fill in the blank to improve word finding for statement - "Britta Mccreedy brought me an original chicken sandwich and waffle fries with a Dr Reino Kent." SLP further facilitated session by targeting pt's apraxia of speech. Pt with great difficulty conditioning to task of verbal repetition in order to verbally repeat targeted motor movements. Also some evidence concerning for hearing difficulty. As a result, limited ability to target consistent motor speech targets.    PATIENT EDUCATION: Education details: see above Person educated: Patient and Wife Education method: Explanation, Facilities manager, Verbal cues, and Handouts Education comprehension: verbalized understanding  HOME EXERCISE PROGRAM:  Complete calendar Put lists in alphabetical order When writing down information, cue pt to write down additional words     GOALS: Goals reviewed with patient? Yes  SHORT TERM GOALS: Target date: 10 sessions  UPDATED 12/13/2022 - see previous notes for progress towards goals   3.   With minimal cues, pt will name set of 10 common objects in 8 out of 10 opportunities.  Goal status: MET; upgraded to: Pt will improve wording finding abilities by writing 3 or more words during communication exchanges to increase listener understanding with moderate A in 5 out of 7 opportunities.  5. Pt will improve spelling ability by accurately writing sentence level description of his ADLs/iADLs with >  75% accuracy with Mod A cues.   Goal status: NOT MET; downgraded to: Pt will improve spelling ability by accurately spelling words during communication exchanges to increase listener understanding with min A in 5 out of 7 opportunities.  6.  Pt will read sentences related to his ADLs/iADLs with > 75% speech intelligibility and moderate assistance.    Goal Status: MET; upgraded to: Pt will read self-generated words during communication exchanges with > 50% given mod A.  7.  Pt will recognize and name lowercase letters in written text with >50% accuracy given maximal cues.  Goal status: discontinued d/t higher level receptive language deficits    LONG TERM GOALS: Target date: 01/20/2023   UPDATED 12/13/2022 - see previous notes for progress towards goals  Pt will use multimodal communication to express basic wants/needs in 4 of 10 opportunities.  Baseline: unable Goal status: ONGOING    2.  Pt will demonstrate increased reading comprehension by selecting accurate answer from 3 choices in 8 out of 10 opportunities.  Goal status: ONGOING  ASSESSMENT:  CLINICAL IMPRESSION: While  pt continues to use written language, his word finding difficulties are also impeding his ability to provide more written information during moments of communication breakdowns.   OBJECTIVE IMPAIRMENTS include expressive language, receptive language, aphasia, and apraxia. These impairments are limiting patient from managing medications, managing appointments, managing finances, household responsibilities, ADLs/IADLs, and effectively communicating at home and in community. Factors affecting potential to achieve goals and functional outcome are severity of impairments. Patient will benefit from skilled SLP services to address above impairments and improve overall function.  REHAB POTENTIAL: Excellent  PLAN: SLP FREQUENCY: 3x/week  SLP DURATION: 12 weeks  PLANNED INTERVENTIONS: Language facilitation, Environmental  controls, Internal/external aids, Functional tasks, Multimodal communication approach, SLP instruction and feedback, Compensatory strategies, and Patient/family education    Archita Lomeli B. Dreama Saa, M.S., CCC-SLP, Tree surgeon Certified Brain Injury Specialist [facility]  [facility] Rehabilitation Services Office [phone] Ascom [phone] Fax [phone]

## 2023-01-17 ENCOUNTER — Ambulatory Visit: Payer: Medicare HMO | Admitting: Speech Pathology

## 2023-01-17 DIAGNOSIS — R4701 Aphasia: Secondary | ICD-10-CM | POA: Diagnosis not present

## 2023-01-17 DIAGNOSIS — I6602 Occlusion and stenosis of left middle cerebral artery: Secondary | ICD-10-CM

## 2023-01-17 DIAGNOSIS — R482 Apraxia: Secondary | ICD-10-CM

## 2023-01-17 NOTE — Therapy (Signed)
OUTPATIENT SPEECH LANGUAGE PATHOLOGY TREATMENT NOTE 10TH SESSION NOTE     Patient Name: Alan Stewart MRN: [id number] DOB:16-Nov-1945, 77 y.o., male Today's Date: 01/15/2023  Speech Therapy Progress Note  Dates of Reporting Period: 12/16/2022 to 01/17/2023  Objective: Patient has been seen for 10 speech therapy sessions this reporting period targeting speech language abilities. Patient is making progress toward LTGs and met 2 STGs this reporting period. See skilled intervention, clinical impressions, and goals below for details.   PCP: Daniel Nones, MD REFERRING PROVIDER: Daniel Nones, MD   END OF SESSION   End of Session - 01/15/23 0855     Visit Number 79    Number of Visits 92    Date for SLP Re-Evaluation 01/20/23    Authorization Type [employer] H[employer]    Authorization Time Period 11/22/2022 thru 01/27/2023    Authorization - Visit Number 17    Authorization - Number of Visits 20    Progress Note Due on Visit 80    SLP Start Time 1000    SLP Stop Time  1100    SLP Time Calculation (min) 60 min    Activity Tolerance Patient tolerated treatment well               Past Medical History:  Diagnosis Date   CAD (coronary artery disease)    a. 10/2021 NSTEMI/PCI: LM nl, LAD min irregs, LCX 32m (2.5x18 Onyx Frontier DES), OM1 nl, RCA 24m (3.5x26 Onyx Frontier DES), 30d.   Diastolic dysfunction    a. 10/2021 Echo: EF 50-55%, no rwma, Gr1 DD, nl RV fxn. No significant valvular dzs.   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 SCCis. MOHs 07/08/22   Past Surgical History:  Procedure Laterality Date   COLONOSCOPY N/A 10/09/2015   Procedure: COLONOSCOPY;  Surgeon: Wallace Cullens, MD;  Location: [facility] ENDOSCOPY;  Service: Gastroenterology;  Laterality: N/A;    CORONARY STENT INTERVENTION N/A 10/22/2021   Procedure: CORONARY STENT INTERVENTION;  Surgeon: Iran Ouch, MD;  Location: [facility] INVASIVE CV LAB;  Service: Cardiovascular;  Laterality: N/A;   IR CT HEAD LTD  05/21/2022   IR PERCUTANEOUS ART THROMBECTOMY/INFUSION INTRACRANIAL INC DIAG ANGIO  05/21/2022   LEFT HEART CATH AND CORONARY ANGIOGRAPHY N/A 10/22/2021   Procedure: LEFT HEART CATH AND CORONARY ANGIOGRAPHY;  Surgeon: Iran Ouch, MD;  Location: [facility] INVASIVE CV LAB;  Service: Cardiovascular;  Laterality: N/A;   LOOP RECORDER INSERTION N/A 05/23/2022   Procedure: LOOP RECORDER INSERTION;  Surgeon: Lanier Prude, MD;  Location: [facility] INVASIVE CV LAB;  Service: Cardiovascular;  Laterality: N/A;   RADIOLOGY WITH ANESTHESIA N/A 05/20/2022   Procedure: IR WITH ANESTHESIA;  Surgeon: Julieanne Cotton, MD;  Location: [facility] OR;  Service: Radiology;  Laterality: N/A;   ROTATOR CUFF REPAIR     Patient Active Problem List   Diagnosis Date Noted   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 male with PMH: HTN, CAD, HLD, osteoarthritis, SCC, sleep apnea. He presented to the hospital on 05/20/22 with right sided weakness and aphasia with LKW reported by wife to be 10pm on 10/2.  He arrived at [facility] as code stroke and taken for emergent CT/CTA which demonstrated left M1 occlusion. He received IV tenecteplase and IR thrombectomy due to large vessel occlusion.       DIAGNOSTIC FINDINGS:  MRI 05/22/2022 Cortical ischemia throughout the left MCA  territory compatible with recent infarct. Relatively little affect on the white matter. 2. Small focus of acute hemorrhage along the anterior surface of the left temporal lobe Heidelberg classification 3c: Subarachnoid hemorrhage. 3. Normal intracranial MRA.  Restored flow in left MCA.    THERAPY DIAG:  Aphasia  Apraxia  Middle cerebral artery embolism, left  Rationale for Evaluation and Treatment Rehabilitation  SUBJECTIVE:   SUBJECTIVE STATEMENT: Pt's wife continues to report that pt is talking more - "he doesn't realize when something comes out correctly" Pt accompanied by: self and significant other  PAIN:  Are you having pain? No  PATIENT GOALS: to be able to communicate again  OBJECTIVE:   TODAY'S TREATMENT: Skilled treatment session focused on pt's communication goals. SLP facilitated session by providing the following interventions:  Apraxia of speech targeted thru use of single syllable words with bilabial /p/ in initial position. Pt with less difficulty understanding task with words written. As such, pt's motor speech was improved with moderate assistance during movement errors - overall, pt able to state word after SLP model with 90% speech intelligibility.    PATIENT EDUCATION: Education details: see above Person educated: Patient and Wife Education method: Explanation, Facilities manager, Verbal cues, and Handouts Education comprehension: verbalized understanding  HOME EXERCISE PROGRAM:  Complete calendar Put lists in alphabetical order When writing down information, cue pt to write down additional words     GOALS: Goals reviewed with patient? Yes  SHORT TERM GOALS: Target date: 10 sessions  UPDATED 12/13/2022 - see previous notes for progress towards goals  UPDATED 01/17/2023   3.   With minimal cues, pt will name set of 10 common objects in 8 out of 10 opportunities.  Goal status: MET; upgraded to: Pt will improve wording finding abilities by writing 3  or more words during communication exchanges to increase listener understanding with moderate A in 5 out of 7 opportunities; ONGOING 5. Pt will improve spelling ability by accurately writing sentence level description of his ADLs/iADLs with > 75% accuracy with Mod A cues.   Goal status: NOT MET; downgraded to: Pt will improve spelling ability by accurately spelling words during communication exchanges to increase listener understanding with min A in 5 out of 7 opportunities.; MET for basic words; improve to include semi-complex words 6.  Pt will read sentences related to his ADLs/iADLs with > 75% speech intelligibility and moderate assistance.    Goal Status: MET; upgraded to: Pt will read self-generated words during communication exchanges with > 50% given mod A; ONGOING 7.  Pt will recognize and name lowercase letters in written text with >50% accuracy given maximal cues.  Goal status: discontinued d/t higher level receptive language deficits    LONG TERM GOALS: Target date: 01/20/2023   UPDATED 12/13/2022 - see previous notes for progress towards goals  Pt will use multimodal communication to express basic wants/needs in 4 of 10 opportunities.  Baseline: unable Goal status: ONGOING    2.  Pt will demonstrate increased reading comprehension by selecting accurate answer from 3 choices in 8 out of 10 opportunities.  Goal status: ONGOING  ASSESSMENT:  CLINICAL IMPRESSION: Pt and his wife continue to be eager and as such pt has made continued progress towards his goals (see above for detailed progress).   OBJECTIVE IMPAIRMENTS include expressive language, receptive language, aphasia, and apraxia. These impairments are limiting patient from managing medications, managing appointments, managing finances, household responsibilities, ADLs/IADLs, and effectively communicating at home and in community. Factors affecting potential to achieve goals and functional outcome are severity of impairments.  Patient will benefit from skilled SLP services to address above impairments and improve overall function.  REHAB POTENTIAL: Excellent  PLAN: SLP FREQUENCY: 3x/week  SLP DURATION: 12 weeks  PLANNED INTERVENTIONS: Language facilitation, Environmental controls, Internal/external aids, Functional tasks, Multimodal communication approach, SLP instruction and feedback, Compensatory strategies, and Patient/family education    Darrel Baroni B. Dreama Saa, M.S., CCC-SLP, Tree surgeon Certified Brain Injury Specialist [facility]  [facility] Rehabilitation Services Office [phone] Ascom [phone] Fax [phone]

## 2023-01-20 ENCOUNTER — Ambulatory Visit: Payer: Medicare HMO | Attending: Internal Medicine | Admitting: Speech Pathology

## 2023-01-20 DIAGNOSIS — I6602 Occlusion and stenosis of left middle cerebral artery: Secondary | ICD-10-CM

## 2023-01-20 DIAGNOSIS — R4701 Aphasia: Secondary | ICD-10-CM

## 2023-01-20 DIAGNOSIS — R482 Apraxia: Secondary | ICD-10-CM | POA: Diagnosis not present

## 2023-01-20 NOTE — Therapy (Signed)
OUTPATIENT SPEECH LANGUAGE PATHOLOGY TREATMENT NOTE Re-CERTIFICATION REQUEST     Patient Name: Alan Stewart MRN: [id number] DOB:October 21, 1945, 77 y.o., male Today's Date: 01/20/2023   PCP: Daniel Nones, MD REFERRING PROVIDER: Daniel Nones, MD   END OF SESSION  End of Session - 01/20/23 1106     Visit Number 81    Number of Visits 117    Date for SLP Re-Evaluation 04/14/23    Authorization Type [employer] H[employer]    Authorization Time Period 11/22/2022 thru 03/21/2023    Authorization - Visit Number 19    Authorization - Number of Visits 44    Progress Note Due on Visit 90    SLP Start Time 1100    SLP Stop Time  1200    SLP Time Calculation (min) 60 min    Activity Tolerance Patient tolerated treatment well                    Past Medical History:  Diagnosis Date   CAD (coronary artery disease)    a. 10/2021 NSTEMI/PCI: LM nl, LAD min irregs, LCX 3m (2.5x18 Onyx Frontier DES), OM1 nl, RCA 34m (3.5x26 Onyx Frontier DES), 30d.   Diastolic dysfunction    a. 10/2021 Echo: EF 50-55%, no rwma, Gr1 DD, nl RV fxn. No significant valvular dzs.   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 SCCis. MOHs 07/08/22   Past Surgical History:  Procedure Laterality Date   COLONOSCOPY N/A 10/09/2015   Procedure: COLONOSCOPY;  Surgeon: Wallace Cullens, MD;  Location: [facility] ENDOSCOPY;  Service: Gastroenterology;  Laterality: N/A;   CORONARY STENT INTERVENTION N/A 10/22/2021   Procedure: CORONARY STENT INTERVENTION;  Surgeon: Iran Ouch, MD;  Location: [facility] INVASIVE CV LAB;  Service: Cardiovascular;  Laterality: N/A;   IR CT HEAD LTD  05/21/2022   IR PERCUTANEOUS ART THROMBECTOMY/INFUSION INTRACRANIAL INC DIAG ANGIO  05/21/2022   LEFT HEART CATH AND CORONARY ANGIOGRAPHY N/A  10/22/2021   Procedure: LEFT HEART CATH AND CORONARY ANGIOGRAPHY;  Surgeon: Iran Ouch, MD;  Location: [facility] INVASIVE CV LAB;  Service: Cardiovascular;  Laterality: N/A;   LOOP RECORDER INSERTION N/A 05/23/2022   Procedure: LOOP RECORDER INSERTION;  Surgeon: Lanier Prude, MD;  Location: [facility] INVASIVE CV LAB;  Service: Cardiovascular;  Laterality: N/A;   RADIOLOGY WITH ANESTHESIA N/A 05/20/2022   Procedure: IR WITH ANESTHESIA;  Surgeon: Julieanne Cotton, MD;  Location: [facility] OR;  Service: Radiology;  Laterality: N/A;   ROTATOR CUFF REPAIR     Patient Active Problem List   Diagnosis Date Noted   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 male with PMH: HTN, CAD, HLD, osteoarthritis, SCC, sleep apnea. He presented to the hospital on 05/20/22 with right sided weakness and aphasia with LKW reported by wife to be  10pm on 10/2.  He arrived at [facility] as code stroke and taken for emergent CT/CTA which demonstrated left M1 occlusion. He received IV tenecteplase and IR thrombectomy due to large vessel occlusion.       DIAGNOSTIC FINDINGS:  MRI 05/22/2022 Cortical ischemia throughout the left MCA territory compatible with recent infarct. Relatively little affect on the white matter. 2. Small focus of acute hemorrhage along the anterior surface of the left temporal lobe Heidelberg classification 3c: Subarachnoid hemorrhage. 3. Normal intracranial MRA.  Restored flow in left MCA.    THERAPY DIAG:  Aphasia  Apraxia  Middle cerebral artery  embolism, left  Rationale for Evaluation and Treatment Rehabilitation  SUBJECTIVE:   SUBJECTIVE STATEMENT: Pt didn't feel great this weekend Pt accompanied by: self and significant other  PAIN:  Are you having pain? No  PATIENT GOALS: to be able to communicate again  OBJECTIVE:   TODAY'S TREATMENT: Skilled treatment session focused on pt's communication goals. SLP facilitated session by providing the following interventions:  Apraxia of speech targeted thru use of single syllable words with bilabial /p/ in initial position.  Pt's motor speech was improved with minimal assistance during movement errors - overall, pt able to state word after SLP model with 90% speech intelligibility improving to 75% when reading each word independently. With moderate cues pt able to write down 2 descriptive words to supplement word finding difficulty when describing kabobs.     PATIENT EDUCATION: Education details: see above Person educated: Patient and Wife Education method: Explanation, Facilities manager, Verbal cues, and Handouts Education comprehension: verbalized understanding  HOME EXERCISE PROGRAM:  Complete calendar Put lists in alphabetical order When writing down information, cue pt to write down additional words     GOALS: Goals reviewed with patient? Yes  SHORT TERM GOALS: Target date: 10 sessions  UPDATED: 01/20/2023  3.   Pt will improve wording finding abilities by writing 3 or more words during communication exchanges to increase listener understanding with moderate A in 5 out of 7 opportunities; ONGOING 5. Pt will improve spelling ability by accurately spelling SEMI-COMPLEX words during communication exchanges to increase listener understanding with min A in 5 out of 7 opportunities; INITIAL 6.  Pt will read self-generated words during communication exchanges with > 50% speech intelligibility given Min A articulatory cues; INITIAL   LONG TERM GOALS: Target date: 01/20/2023    UPDATED 12/13/2022 - see previous notes for progress towards goals  UPDATED 01/20/2023  Pt will use multimodal communication to express basic wants/needs in 4 of 10 opportunities.  Baseline: unable Goal status: ONGOING; ONGOING    2.  Pt will demonstrate increased reading comprehension by selecting accurate answer from 3 choices in 8 out of 10 opportunities.  Goal status: ONGOING; MET (01/20/2023)  ASSESSMENT:  CLINICAL IMPRESSION: Pt and his wife continue to be eager and as such pt has made continued progress towards his goals (see above for detailed progress). Will request re-certification for continuation of functional communication.   OBJECTIVE IMPAIRMENTS include expressive language, receptive language, aphasia, and apraxia. These impairments are limiting patient from managing medications, managing appointments, managing finances, household responsibilities, ADLs/IADLs, and effectively communicating at home and in community. Factors affecting potential to achieve goals and functional outcome are severity of impairments. Patient will benefit from skilled SLP services to address above impairments and improve overall function.  REHAB POTENTIAL: Excellent  PLAN: SLP FREQUENCY: 3x/week  SLP DURATION: 12 weeks  PLANNED INTERVENTIONS: Language facilitation, Environmental controls, Internal/external aids, Functional tasks, Multimodal communication  approach, SLP instruction and feedback, Compensatory strategies, and Patient/family education    Jesselee Poth B. Dreama Saa, M.S., CCC-SLP, Tree surgeon Certified Brain Injury Specialist [facility]  [facility] Rehabilitation Services Office [phone] Ascom [phone] Fax [phone]

## 2023-01-20 NOTE — Addendum Note (Signed)
Addended by: Reuel Derby B on: 01/20/2023 12:57 PM   Modules accepted: Orders

## 2023-01-22 ENCOUNTER — Ambulatory Visit: Payer: Medicare HMO | Admitting: Speech Pathology

## 2023-01-22 DIAGNOSIS — I6602 Occlusion and stenosis of left middle cerebral artery: Secondary | ICD-10-CM

## 2023-01-22 DIAGNOSIS — R482 Apraxia: Secondary | ICD-10-CM

## 2023-01-22 DIAGNOSIS — R4701 Aphasia: Secondary | ICD-10-CM

## 2023-01-22 NOTE — Therapy (Signed)
OUTPATIENT SPEECH LANGUAGE PATHOLOGY TREATMENT NOTE Re-CERTIFICATION REQUEST     Patient Name: Alan Stewart MRN: [id number] DOB:[birth year], 77 y.o., male Today's Date: 01/22/2023   PCP: Daniel Nones, MD REFERRING PROVIDER: Daniel Nones, MD   END OF SESSION  End of Session - 01/22/23 1235     Visit Number 82    Number of Visits 117    Date for SLP Re-Evaluation 04/14/23    Authorization Type [employer] H[employer]    Authorization Time Period 11/22/2022 thru 03/21/2023    Authorization - Visit Number 20    Authorization - Number of Visits 44    Progress Note Due on Visit 90    SLP Start Time 1000    SLP Stop Time  1100    SLP Time Calculation (min) 60 min    Activity Tolerance Patient tolerated treatment well                    Past Medical History:  Diagnosis Date   CAD (coronary artery disease)    a. 10/2021 NSTEMI/PCI: LM nl, LAD min irregs, LCX 6m (2.5x18 Onyx Frontier DES), OM1 nl, RCA 21m (3.5x26 Onyx Frontier DES), 30d.   Diastolic dysfunction    a. 10/2021 Echo: EF 50-55%, no rwma, Gr1 DD, nl RV fxn. No significant valvular dzs.   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 SCCis. MOHs 07/08/22   Past Surgical History:  Procedure Laterality Date   COLONOSCOPY N/A 10/09/2015   Procedure: COLONOSCOPY;  Surgeon: Wallace Cullens, MD;  Location: [facility] ENDOSCOPY;  Service: Gastroenterology;  Laterality: N/A;   CORONARY STENT INTERVENTION N/A 10/22/2021   Procedure: CORONARY STENT INTERVENTION;  Surgeon: Iran Ouch, MD;  Location: [facility] INVASIVE CV LAB;  Service: Cardiovascular;  Laterality: N/A;   IR CT HEAD LTD  05/21/2022   IR PERCUTANEOUS ART THROMBECTOMY/INFUSION INTRACRANIAL INC DIAG ANGIO  05/21/2022   LEFT HEART CATH AND CORONARY ANGIOGRAPHY N/A  10/22/2021   Procedure: LEFT HEART CATH AND CORONARY ANGIOGRAPHY;  Surgeon: Iran Ouch, MD;  Location: [facility] INVASIVE CV LAB;  Service: Cardiovascular;  Laterality: N/A;   LOOP RECORDER INSERTION N/A 05/23/2022   Procedure: LOOP RECORDER INSERTION;  Surgeon: Lanier Prude, MD;  Location: [facility] INVASIVE CV LAB;  Service: Cardiovascular;  Laterality: N/A;   RADIOLOGY WITH ANESTHESIA N/A 05/20/2022   Procedure: IR WITH ANESTHESIA;  Surgeon: Julieanne Cotton, MD;  Location: [facility] OR;  Service: Radiology;  Laterality: N/A;   ROTATOR CUFF REPAIR     Patient Active Problem List   Diagnosis Date Noted   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 male with PMH: HTN, CAD, HLD, osteoarthritis, SCC, sleep apnea. He presented to the hospital on 05/20/22 with right sided weakness and aphasia with LKW reported by wife to be  10pm on 10/2.  He arrived at [facility] as code stroke and taken for emergent CT/CTA which demonstrated left M1 occlusion. He received IV tenecteplase and IR thrombectomy due to large vessel occlusion.       DIAGNOSTIC FINDINGS:  MRI 05/22/2022 Cortical ischemia throughout the left MCA territory compatible with recent infarct. Relatively little affect on the white matter. 2. Small focus of acute hemorrhage along the anterior surface of the left temporal lobe Heidelberg classification 3c: Subarachnoid hemorrhage. 3. Normal intracranial MRA.  Restored flow in left MCA.    THERAPY DIAG:  Aphasia  Apraxia  Middle cerebral artery  embolism, left  Rationale for Evaluation and Treatment Rehabilitation  SUBJECTIVE:   SUBJECTIVE STATEMENT: Pt didn't feel great during the earlier part of the week. Improved word finding noted during initial conversation with his wife as she cued him using sentence completion.  Pt accompanied by: self and significant other  PAIN:  Are you having pain? No  PATIENT GOALS: to be able to communicate again  OBJECTIVE:   TODAY'S TREATMENT: Skilled treatment session focused on pt's communication goals. SLP facilitated session by providing the following interventions:  Word finding skills were targeted thru verbal vocabulary development specifically opposites. Instead of writing down correct response, pt was instructed to verbalize opposite. With minimal cues, pt able to state opposite word with 75% accuracy and 100% speech intelligibility. When pt engaged in writing down word (during moments of inability to verbalize) his responses contained semantic paraphasias.   PATIENT EDUCATION: Education details: see above Person educated: Patient and Wife Education method: Explanation, Facilities manager, Verbal cues, and Handouts Education comprehension: verbalized understanding  HOME EXERCISE PROGRAM:  Complete calendar Put lists in alphabetical order When writing down information, cue pt to write down additional words     GOALS: Goals reviewed with patient? Yes  SHORT TERM GOALS: Target date: 10 sessions  UPDATED: 01/20/2023  3.   Pt will improve wording finding abilities by writing 3 or more words during communication exchanges to increase listener understanding with moderate A in 5 out of 7 opportunities; ONGOING 5. Pt will improve spelling ability by accurately spelling SEMI-COMPLEX words during communication exchanges to increase listener understanding with min A in 5 out of 7 opportunities; INITIAL 6.  Pt will read self-generated words during communication exchanges with > 50% speech  intelligibility given Min A articulatory cues; INITIAL   LONG TERM GOALS: Target date: 01/20/2023   UPDATED 12/13/2022 - see previous notes for progress towards goals  UPDATED 01/20/2023  Pt will use multimodal communication to express basic wants/needs in 4 of 10 opportunities.  Baseline: unable Goal status: ONGOING; ONGOING    2.  Pt will demonstrate increased reading comprehension by selecting accurate answer from 3 choices in 8 out of 10 opportunities.  Goal status: ONGOING; MET (01/20/2023)  ASSESSMENT:  CLINICAL IMPRESSION: Pt is a 77 year old male who was seen today for therapy targeting his aphasia. See treatment note for details.   OBJECTIVE IMPAIRMENTS include expressive language, receptive language, aphasia, and apraxia. These impairments are limiting patient from managing medications, managing appointments, managing finances, household responsibilities, ADLs/IADLs, and effectively communicating at home and in community. Factors affecting potential to achieve goals and functional outcome are severity of impairments. Patient will benefit from skilled SLP services to address above impairments and improve overall function.  REHAB POTENTIAL: Excellent  PLAN: SLP FREQUENCY: 3x/week  SLP DURATION: 12 weeks  PLANNED INTERVENTIONS: Language facilitation, Environmental controls, Internal/external aids, Functional tasks, Multimodal communication approach, SLP  instruction and feedback, Compensatory strategies, and Patient/family education    Javia Dillow B. Dreama Saa, M.S., CCC-SLP, Tree surgeon Certified Brain Injury Specialist [facility]  [facility] Rehabilitation Services Office [phone] Ascom [phone] Fax [phone]

## 2023-01-24 ENCOUNTER — Ambulatory Visit: Payer: Medicare HMO | Admitting: Speech Pathology

## 2023-01-24 DIAGNOSIS — R4701 Aphasia: Secondary | ICD-10-CM | POA: Diagnosis not present

## 2023-01-24 DIAGNOSIS — R482 Apraxia: Secondary | ICD-10-CM | POA: Diagnosis not present

## 2023-01-24 DIAGNOSIS — I6602 Occlusion and stenosis of left middle cerebral artery: Secondary | ICD-10-CM

## 2023-01-24 NOTE — Therapy (Signed)
OUTPATIENT SPEECH LANGUAGE PATHOLOGY TREATMENT NOTE    Patient Name: Alan Stewart MRN: [id number] DOB:1946-03-15, 77 y.o., male Today's Date: 01/24/2023   PCP: Daniel Nones, MD REFERRING PROVIDER: Daniel Nones, MD   END OF SESSION  End of Session - 01/24/23 0959     Visit Number 83    Number of Visits 117    Date for SLP Re-Evaluation 04/14/23    Authorization Type [employer] H[employer]    Authorization Time Period 11/22/2022 thru 03/21/2023    Authorization - Visit Number 21    Authorization - Number of Visits 44    Progress Note Due on Visit 90    SLP Start Time 1000    SLP Stop Time  1100    SLP Time Calculation (min) 60 min    Activity Tolerance Patient tolerated treatment well                    Past Medical History:  Diagnosis Date   CAD (coronary artery disease)    a. 10/2021 NSTEMI/PCI: LM nl, LAD min irregs, LCX 67m (2.5x18 Onyx Frontier DES), OM1 nl, RCA 48m (3.5x26 Onyx Frontier DES), 30d.   Diastolic dysfunction    a. 10/2021 Echo: EF 50-55%, no rwma, Gr1 DD, nl RV fxn. No significant valvular dzs.   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 SCCis. MOHs 07/08/22   Past Surgical History:  Procedure Laterality Date   COLONOSCOPY N/A 10/09/2015   Procedure: COLONOSCOPY;  Surgeon: Wallace Cullens, MD;  Location: [facility] ENDOSCOPY;  Service: Gastroenterology;  Laterality: N/A;   CORONARY STENT INTERVENTION N/A 10/22/2021   Procedure: CORONARY STENT INTERVENTION;  Surgeon: Iran Ouch, MD;  Location: [facility] INVASIVE CV LAB;  Service: Cardiovascular;  Laterality: N/A;   IR CT HEAD LTD  05/21/2022   IR PERCUTANEOUS ART THROMBECTOMY/INFUSION INTRACRANIAL INC DIAG ANGIO  05/21/2022   LEFT HEART CATH AND CORONARY ANGIOGRAPHY N/A 10/22/2021   Procedure: LEFT HEART  CATH AND CORONARY ANGIOGRAPHY;  Surgeon: Iran Ouch, MD;  Location: [facility] INVASIVE CV LAB;  Service: Cardiovascular;  Laterality: N/A;   LOOP RECORDER INSERTION N/A 05/23/2022   Procedure: LOOP RECORDER INSERTION;  Surgeon: Lanier Prude, MD;  Location: [facility] INVASIVE CV LAB;  Service: Cardiovascular;  Laterality: N/A;   RADIOLOGY WITH ANESTHESIA N/A 05/20/2022   Procedure: IR WITH ANESTHESIA;  Surgeon: Julieanne Cotton, MD;  Location: [facility] OR;  Service: Radiology;  Laterality: N/A;   ROTATOR CUFF REPAIR     Patient Active Problem List   Diagnosis Date Noted   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 male with PMH: HTN, CAD, HLD, osteoarthritis, SCC, sleep apnea. He presented to the hospital on 05/20/22 with right sided weakness and aphasia with LKW reported by wife to be 10pm on 10/2.  He arrived at [facility] as code stroke and taken for emergent CT/CTA which demonstrated left M1 occlusion. He received IV tenecteplase and IR thrombectomy due to large vessel occlusion.       DIAGNOSTIC FINDINGS:  MRI 05/22/2022 Cortical ischemia throughout the left MCA territory compatible with recent infarct. Relatively little affect on the white matter. 2. Small focus of acute hemorrhage along the anterior surface of the left temporal lobe Heidelberg classification 3c: Subarachnoid hemorrhage. 3. Normal intracranial MRA.  Restored flow in left MCA.    THERAPY DIAG:  Aphasia  Apraxia  Middle cerebral artery embolism, left  Rationale for  Evaluation and Treatment Rehabilitation  SUBJECTIVE:   SUBJECTIVE STATEMENT: Pt didn't feel great during the earlier part of the week. Improved word finding noted during initial conversation with his wife as she cued him using sentence completion.  Pt accompanied by: self and significant other  PAIN:  Are you having pain? No  PATIENT GOALS: to be able to communicate again  OBJECTIVE:   TODAY'S TREATMENT: Skilled treatment session focused on pt's communication goals. SLP facilitated session by providing the following interventions:  TalkPath Therapy app utilized during today's session: WORD ID - (targeting receptive language) level 1- 15 out of 20 improving to 20 out of 20 with repetition of stimulus; level 2 - 15 out of 20 improving to 20 out of 20 with repetition of stimulus; level 3 - 15 out of 20 improving to 20 out of 20 with repetition of the stimulus.  Sentence Scramble - level 2 - 16 our of 20 improving to 20 out of 20 with minimal A; max to moderate verbal cues required to read the sentence with > 95% speech intelligibility WORD ID - (targeting reading comprehension) - level 2 - 18 out of 20  PATIENT EDUCATION: Education details: see above Person educated: Patient and Wife Education method: Explanation, Demonstration, Verbal cues, and Handouts Education comprehension: verbalized understanding  HOME EXERCISE PROGRAM:  Complete calendar Put lists in alphabetical order When writing down information, cue pt to write down additional words     GOALS: Goals reviewed with patient? Yes  SHORT TERM GOALS: Target date: 10 sessions  UPDATED: 01/20/2023  3.   Pt will improve wording finding abilities by writing 3 or more words during communication exchanges to increase listener understanding with moderate A in 5 out of 7 opportunities; ONGOING 5. Pt will improve spelling ability by accurately spelling SEMI-COMPLEX words during communication exchanges to increase listener  understanding with min A in 5 out of 7 opportunities; INITIAL 6.  Pt will read self-generated words during communication exchanges with > 50% speech intelligibility given Min A articulatory cues; INITIAL   LONG TERM GOALS: Target date: 01/20/2023   UPDATED 12/13/2022 - see previous notes for progress towards goals  UPDATED 01/20/2023  Pt will use multimodal communication to express basic wants/needs in 4 of 10 opportunities.  Baseline: unable Goal status: ONGOING; ONGOING    2.  Pt will demonstrate increased reading comprehension by selecting accurate answer from 3 choices in 8 out of 10 opportunities.  Goal status: ONGOING; MET (01/20/2023)  ASSESSMENT:  CLINICAL IMPRESSION: Pt is a 77 year old male who was seen today for therapy targeting his aphasia. See treatment note for details.   OBJECTIVE IMPAIRMENTS include expressive language, receptive language, aphasia, and apraxia. These impairments are limiting patient from managing medications, managing appointments, managing finances, household responsibilities, ADLs/IADLs, and effectively communicating at home and in community. Factors affecting potential to achieve goals  and functional outcome are severity of impairments. Patient will benefit from skilled SLP services to address above impairments and improve overall function.  REHAB POTENTIAL: Excellent  PLAN: SLP FREQUENCY: 3x/week  SLP DURATION: 12 weeks  PLANNED INTERVENTIONS: Language facilitation, Environmental controls, Internal/external aids, Functional tasks, Multimodal communication approach, SLP instruction and feedback, Compensatory strategies, and Patient/family education    Navid Lenzen B. Dreama Saa, M.S., CCC-SLP, Tree surgeon Certified Brain Injury Specialist [facility]  [facility] Rehabilitation Services Office [phone] Ascom [phone] Fax [phone]

## 2023-01-27 ENCOUNTER — Ambulatory Visit: Payer: Medicare HMO | Admitting: Speech Pathology

## 2023-01-27 ENCOUNTER — Ambulatory Visit (INDEPENDENT_AMBULATORY_CARE_PROVIDER_SITE_OTHER): Payer: Medicare HMO

## 2023-01-27 DIAGNOSIS — R4701 Aphasia: Secondary | ICD-10-CM

## 2023-01-27 DIAGNOSIS — I63412 Cerebral infarction due to embolism of left middle cerebral artery: Secondary | ICD-10-CM

## 2023-01-27 DIAGNOSIS — R482 Apraxia: Secondary | ICD-10-CM

## 2023-01-27 DIAGNOSIS — I6602 Occlusion and stenosis of left middle cerebral artery: Secondary | ICD-10-CM | POA: Diagnosis not present

## 2023-01-27 NOTE — Therapy (Signed)
OUTPATIENT SPEECH LANGUAGE PATHOLOGY TREATMENT NOTE    Patient Name: Alan Stewart MRN: [id number] DOB:[birth year], 77 y.o., male Today's Date: 01/27/2023   PCP: Daniel Nones, MD REFERRING PROVIDER: Daniel Nones, MD   END OF SESSION  End of Session - 01/27/23 1117     Visit Number 84    Number of Visits 117    Date for SLP Re-Evaluation 04/14/23    Authorization Type [employer] H[employer]    Authorization Time Period 11/22/2022 thru 03/21/2023    Authorization - Visit Number 22    Authorization - Number of Visits 44    Progress Note Due on Visit 90    SLP Start Time 1100    SLP Stop Time  1145    SLP Time Calculation (min) 45 min    Activity Tolerance Patient tolerated treatment well                    Past Medical History:  Diagnosis Date   CAD (coronary artery disease)    a. 10/2021 NSTEMI/PCI: LM nl, LAD min irregs, LCX 69m (2.5x18 Onyx Frontier DES), OM1 nl, RCA 50m (3.5x26 Onyx Frontier DES), 30d.   Diastolic dysfunction    a. 10/2021 Echo: EF 50-55%, no rwma, Gr1 DD, nl RV fxn. No significant valvular dzs.   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 SCCis. MOHs 07/08/22   Past Surgical History:  Procedure Laterality Date   COLONOSCOPY N/A 10/09/2015   Procedure: COLONOSCOPY;  Surgeon: Wallace Cullens, MD;  Location: [facility] ENDOSCOPY;  Service: Gastroenterology;  Laterality: N/A;   CORONARY STENT INTERVENTION N/A 10/22/2021   Procedure: CORONARY STENT INTERVENTION;  Surgeon: Iran Ouch, MD;  Location: [facility] INVASIVE CV LAB;  Service: Cardiovascular;  Laterality: N/A;   IR CT HEAD LTD  05/21/2022   IR PERCUTANEOUS ART THROMBECTOMY/INFUSION INTRACRANIAL INC DIAG ANGIO  05/21/2022   LEFT HEART CATH AND CORONARY ANGIOGRAPHY N/A 10/22/2021   Procedure: LEFT  HEART CATH AND CORONARY ANGIOGRAPHY;  Surgeon: Iran Ouch, MD;  Location: [facility] INVASIVE CV LAB;  Service: Cardiovascular;  Laterality: N/A;   LOOP RECORDER INSERTION N/A 05/23/2022   Procedure: LOOP RECORDER INSERTION;  Surgeon: Lanier Prude, MD;  Location: [facility] INVASIVE CV LAB;  Service: Cardiovascular;  Laterality: N/A;   RADIOLOGY WITH ANESTHESIA N/A 05/20/2022   Procedure: IR WITH ANESTHESIA;  Surgeon: Julieanne Cotton, MD;  Location: [facility] OR;  Service: Radiology;  Laterality: N/A;   ROTATOR CUFF REPAIR     Patient Active Problem List   Diagnosis Date Noted   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 male with PMH: HTN, CAD, HLD, osteoarthritis, SCC, sleep apnea. He presented to the hospital on 05/20/22 with right sided weakness and aphasia with LKW reported by wife to be 10pm on 10/2.  He arrived at [facility] as code stroke and taken for emergent CT/CTA which demonstrated left M1 occlusion. He received IV tenecteplase and IR thrombectomy due to large vessel occlusion.       DIAGNOSTIC FINDINGS:  MRI 05/22/2022 Cortical ischemia throughout the left MCA territory compatible with recent infarct. Relatively little affect on the white matter. 2. Small focus of acute hemorrhage along the anterior surface of the left temporal lobe Heidelberg classification 3c: Subarachnoid hemorrhage. 3. Normal intracranial MRA.  Restored flow in left MCA.    THERAPY DIAG:  Aphasia  Apraxia  Middle cerebral artery embolism, left  Rationale for  Evaluation and Treatment Rehabilitation  SUBJECTIVE:   SUBJECTIVE STATEMENT: "Good"  Pt accompanied by: self and significant other  PAIN:  Are you having pain? No  PATIENT GOALS: to be able to communicate again  OBJECTIVE:   TODAY'S TREATMENT: Skilled treatment session focused on pt's communication goals. SLP facilitated session by providing the following interventions:  SLP targeted pt's apraxia of speech by targeting single syllable and two syllable words with /p/ in the initial position. Pt continues to struggle with verbal imitation. Uncertain if d/t receptive language deficits or possible hearing deficits. Pt was able to write down the words that he thought he heard SLP say. Each word was correct. With moderate verbal cues, pt able to imitate correct motor movement in ~ 70% of words in list.   Pt and his wife report that he is consistently walking their dogs each day.   Pt also responsive to his wife phonemic cues for word finding and improved speech intelligibility during initial casual conversation  PATIENT EDUCATION: Education details: see above Person educated: Patient and Wife Education method: Explanation, Demonstration, Verbal cues, and Handouts Education comprehension: verbalized understanding  HOME EXERCISE PROGRAM:  Apraxia of Speech word lists   GOALS: Goals reviewed with patient? Yes  SHORT TERM GOALS: Target date: 10 sessions  UPDATED: 01/20/2023  3.   Pt will improve wording finding abilities by writing 3 or more words during communication exchanges to increase listener understanding with moderate A in 5 out of 7 opportunities; ONGOING 5. Pt will improve spelling ability by accurately spelling SEMI-COMPLEX words during communication exchanges to increase listener understanding with min A in 5 out of 7 opportunities; INITIAL 6.  Pt will read self-generated words during communication exchanges with > 50% speech intelligibility given Min A articulatory cues;  INITIAL   LONG TERM GOALS: Target date: 01/20/2023   UPDATED 12/13/2022 - see previous notes for progress towards goals  UPDATED 01/20/2023  Pt will use multimodal communication to express basic wants/needs in 4 of 10 opportunities.  Baseline: unable Goal status: ONGOING; ONGOING    2.  Pt will demonstrate increased reading comprehension by selecting accurate answer from 3 choices in 8 out of 10 opportunities.  Goal status: ONGOING; MET (01/20/2023)  ASSESSMENT:  CLINICAL IMPRESSION: Pt is a 77 year old male who was seen today for therapy targeting his aphasia. See treatment note for details. Pt struggles with impulsivity when SLP is demonstrating articulatory cues for motor patterns.   OBJECTIVE IMPAIRMENTS include expressive language, receptive language, aphasia, and apraxia. These impairments are limiting patient from managing medications, managing appointments, managing finances, household responsibilities, ADLs/IADLs, and effectively communicating at home and in community. Factors affecting potential to achieve goals and functional outcome are severity of impairments. Patient will benefit from skilled SLP services to address above impairments and improve overall function.  REHAB POTENTIAL: Excellent  PLAN: SLP FREQUENCY: 3x/week  SLP DURATION: 12 weeks  PLANNED INTERVENTIONS: Language facilitation, Environmental controls, Internal/external aids, Functional tasks, Multimodal communication approach, SLP instruction and feedback, Compensatory strategies, and Patient/family education    Neima Lacross B. Dreama Saa, M.S., CCC-SLP, Tree surgeon Certified Brain Injury Specialist [facility]  [facility] Rehabilitation Services Office [phone] Ascom [phone] Fax [phone]

## 2023-01-28 LAB — CUP PACEART REMOTE DEVICE CHECK
Date Time Interrogation Session: 20240610072809
Implantable Pulse Generator Implant Date: 20231005
Pulse Gen Model: 450218
Pulse Gen Serial Number: 95054077

## 2023-01-29 ENCOUNTER — Ambulatory Visit: Payer: Medicare HMO | Admitting: Speech Pathology

## 2023-01-29 DIAGNOSIS — R482 Apraxia: Secondary | ICD-10-CM | POA: Diagnosis not present

## 2023-01-29 DIAGNOSIS — I6602 Occlusion and stenosis of left middle cerebral artery: Secondary | ICD-10-CM

## 2023-01-29 DIAGNOSIS — R4701 Aphasia: Secondary | ICD-10-CM

## 2023-01-29 NOTE — Therapy (Signed)
OUTPATIENT SPEECH LANGUAGE PATHOLOGY TREATMENT NOTE    Patient Name: Alan Stewart MRN: [id number] DOB:10/13/1945, 77 y.o., male Today's Date: 01/29/2023   PCP: Daniel Nones, MD REFERRING PROVIDER: Daniel Nones, MD   END OF SESSION  End of Session - 01/29/23 1312     Visit Number 85    Number of Visits 117    Date for SLP Re-Evaluation 04/14/23    Authorization Type [employer] H[employer]    Authorization Time Period 11/22/2022 thru 03/21/2023    Authorization - Visit Number 23    Authorization - Number of Visits 44    Progress Note Due on Visit 90    SLP Start Time 1015    SLP Stop Time  1100    SLP Time Calculation (min) 45 min    Activity Tolerance Patient tolerated treatment well                    Past Medical History:  Diagnosis Date   CAD (coronary artery disease)    a. 10/2021 NSTEMI/PCI: LM nl, LAD min irregs, LCX 57m (2.5x18 Onyx Frontier DES), OM1 nl, RCA 56m (3.5x26 Onyx Frontier DES), 30d.   Diastolic dysfunction    a. 10/2021 Echo: EF 50-55%, no rwma, Gr1 DD, nl RV fxn. No significant valvular dzs.   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 SCCis. MOHs 07/08/22   Past Surgical History:  Procedure Laterality Date   COLONOSCOPY N/A 10/09/2015   Procedure: COLONOSCOPY;  Surgeon: Wallace Cullens, MD;  Location: [facility] ENDOSCOPY;  Service: Gastroenterology;  Laterality: N/A;   CORONARY STENT INTERVENTION N/A 10/22/2021   Procedure: CORONARY STENT INTERVENTION;  Surgeon: Iran Ouch, MD;  Location: [facility] INVASIVE CV LAB;  Service: Cardiovascular;  Laterality: N/A;   IR CT HEAD LTD  05/21/2022   IR PERCUTANEOUS ART THROMBECTOMY/INFUSION INTRACRANIAL INC DIAG ANGIO  05/21/2022   LEFT HEART CATH AND CORONARY ANGIOGRAPHY N/A 10/22/2021   Procedure: LEFT  HEART CATH AND CORONARY ANGIOGRAPHY;  Surgeon: Iran Ouch, MD;  Location: [facility] INVASIVE CV LAB;  Service: Cardiovascular;  Laterality: N/A;   LOOP RECORDER INSERTION N/A 05/23/2022   Procedure: LOOP RECORDER INSERTION;  Surgeon: Lanier Prude, MD;  Location: [facility] INVASIVE CV LAB;  Service: Cardiovascular;  Laterality: N/A;   RADIOLOGY WITH ANESTHESIA N/A 05/20/2022   Procedure: IR WITH ANESTHESIA;  Surgeon: Julieanne Cotton, MD;  Location: [facility] OR;  Service: Radiology;  Laterality: N/A;   ROTATOR CUFF REPAIR     Patient Active Problem List   Diagnosis Date Noted   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 male with PMH: HTN, CAD, HLD, osteoarthritis, SCC, sleep apnea. He presented to the hospital on 05/20/22 with right sided weakness and aphasia with LKW reported by wife to be 10pm on 10/2.  He arrived at [facility] as code stroke and taken for emergent CT/CTA which demonstrated left M1 occlusion. He received IV tenecteplase and IR thrombectomy due to large vessel occlusion.       DIAGNOSTIC FINDINGS:  MRI 05/22/2022 Cortical ischemia throughout the left MCA territory compatible with recent infarct. Relatively little affect on the white matter. 2. Small focus of acute hemorrhage along the anterior surface of the left temporal lobe Heidelberg classification 3c: Subarachnoid hemorrhage. 3. Normal intracranial MRA.  Restored flow in left MCA.    THERAPY DIAG:  Aphasia  Apraxia  Middle cerebral artery embolism, left  Rationale for  Evaluation and Treatment Rehabilitation  SUBJECTIVE:   SUBJECTIVE STATEMENT: "Good"  Pt accompanied by: self and significant other  PAIN:  Are you having pain? No  PATIENT GOALS: to be able to communicate again  OBJECTIVE:   TODAY'S TREATMENT: Skilled treatment session focused on pt's communication goals. SLP facilitated session by providing the following interventions:  WALC 6:  Auditory Comprehension Phonetic Word Discrimination: pp 155-165 - 86% Reading Comprehension Word Discrimination: Two-Word Level: pp 230-232 - 85% Word Discrimination: Three-Word Level: pp 233-235 - 80%  PATIENT EDUCATION: Education details: see above Person educated: Patient and Wife Education method: Explanation, Demonstration, Verbal cues, and Handouts Education comprehension: verbalized understanding  HOME EXERCISE PROGRAM:  Apraxia of Speech word lists   GOALS: Goals reviewed with patient? Yes  SHORT TERM GOALS: Target date: 10 sessions  UPDATED: 01/20/2023  3.   Pt will improve wording finding abilities by writing 3 or more words during communication exchanges to increase listener understanding with moderate A in 5 out of 7 opportunities; ONGOING 5. Pt will improve spelling ability by accurately spelling SEMI-COMPLEX words during communication exchanges to increase listener understanding with min A in 5 out of 7 opportunities; INITIAL 6.  Pt will read self-generated words during communication exchanges with > 50% speech intelligibility given Min A articulatory cues; INITIAL   LONG TERM GOALS: Target date: 01/20/2023   UPDATED 12/13/2022 - see previous notes for progress towards goals  UPDATED 01/20/2023  Pt will use multimodal communication to express basic wants/needs in 4 of 10 opportunities.  Baseline: unable Goal status: ONGOING; ONGOING    2.  Pt will demonstrate increased reading comprehension by selecting accurate answer from 3 choices in 8 out of 10 opportunities.  Goal  status: ONGOING; MET (01/20/2023)  ASSESSMENT:  CLINICAL IMPRESSION: Pt is a 77 year old male who was seen today for therapy targeting his aphasia. See treatment note for details.   OBJECTIVE IMPAIRMENTS include expressive language, receptive language, aphasia, and apraxia. These impairments are limiting patient from managing medications, managing appointments, managing finances, household responsibilities, ADLs/IADLs, and effectively communicating at home and in community. Factors affecting potential to achieve goals and functional outcome are severity of impairments. Patient will benefit from skilled SLP services to address above impairments and improve overall function.  REHAB POTENTIAL: Excellent  PLAN: SLP FREQUENCY: 3x/week  SLP DURATION: 12 weeks  PLANNED INTERVENTIONS: Language facilitation, Environmental controls, Internal/external aids, Functional tasks, Multimodal communication approach, SLP instruction and feedback, Compensatory strategies, and Patient/family education    Saraiya Kozma B. Dreama Saa, M.S., CCC-SLP, Tree surgeon Certified Brain Injury Specialist [facility]  [facility] Rehabilitation Services Office [phone] Ascom [phone] Fax [phone]

## 2023-01-31 ENCOUNTER — Ambulatory Visit: Payer: Medicare HMO | Admitting: Speech Pathology

## 2023-02-03 ENCOUNTER — Ambulatory Visit: Payer: Medicare HMO | Admitting: Speech Pathology

## 2023-02-03 DIAGNOSIS — I6602 Occlusion and stenosis of left middle cerebral artery: Secondary | ICD-10-CM

## 2023-02-03 DIAGNOSIS — R482 Apraxia: Secondary | ICD-10-CM | POA: Diagnosis not present

## 2023-02-03 DIAGNOSIS — R4701 Aphasia: Secondary | ICD-10-CM

## 2023-02-03 NOTE — Therapy (Signed)
OUTPATIENT SPEECH LANGUAGE PATHOLOGY TREATMENT NOTE    Patient Name: Alan Stewart MRN: [id number] DOB:Nov 09, 1945, 77 y.o., male Today's Date: 02/03/2023   PCP: Daniel Nones, MD REFERRING PROVIDER: Daniel Nones, MD   END OF SESSION  End of Session - 02/03/23 1321     Visit Number 86    Number of Visits 117    Date for SLP Re-Evaluation 04/14/23    Authorization Type [employer] H[employer]    Authorization Time Period 11/22/2022 thru 03/21/2023    Authorization - Visit Number 24    Authorization - Number of Visits 44    Progress Note Due on Visit 90    SLP Start Time 1100    SLP Stop Time  1145    SLP Time Calculation (min) 45 min    Activity Tolerance Patient tolerated treatment well                    Past Medical History:  Diagnosis Date   CAD (coronary artery disease)    a. 10/2021 NSTEMI/PCI: LM nl, LAD min irregs, LCX 73m (2.5x18 Onyx Frontier DES), OM1 nl, RCA 32m (3.5x26 Onyx Frontier DES), 30d.   Diastolic dysfunction    a. 10/2021 Echo: EF 50-55%, no rwma, Gr1 DD, nl RV fxn. No significant valvular dzs.   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 SCCis. MOHs 07/08/22   Past Surgical History:  Procedure Laterality Date   COLONOSCOPY N/A 10/09/2015   Procedure: COLONOSCOPY;  Surgeon: Wallace Cullens, MD;  Location: [facility] ENDOSCOPY;  Service: Gastroenterology;  Laterality: N/A;   CORONARY STENT INTERVENTION N/A 10/22/2021   Procedure: CORONARY STENT INTERVENTION;  Surgeon: Iran Ouch, MD;  Location: [facility] INVASIVE CV LAB;  Service: Cardiovascular;  Laterality: N/A;   IR CT HEAD LTD  05/21/2022   IR PERCUTANEOUS ART THROMBECTOMY/INFUSION INTRACRANIAL INC DIAG ANGIO  05/21/2022   LEFT HEART CATH AND CORONARY ANGIOGRAPHY N/A 10/22/2021   Procedure: LEFT  HEART CATH AND CORONARY ANGIOGRAPHY;  Surgeon: Iran Ouch, MD;  Location: [facility] INVASIVE CV LAB;  Service: Cardiovascular;  Laterality: N/A;   LOOP RECORDER INSERTION N/A 05/23/2022   Procedure: LOOP RECORDER INSERTION;  Surgeon: Lanier Prude, MD;  Location: [facility] INVASIVE CV LAB;  Service: Cardiovascular;  Laterality: N/A;   RADIOLOGY WITH ANESTHESIA N/A 05/20/2022   Procedure: IR WITH ANESTHESIA;  Surgeon: Julieanne Cotton, MD;  Location: [facility] OR;  Service: Radiology;  Laterality: N/A;   ROTATOR CUFF REPAIR     Patient Active Problem List   Diagnosis Date Noted   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 male with PMH: HTN, CAD, HLD, osteoarthritis, SCC, sleep apnea. He presented to the hospital on 05/20/22 with right sided weakness and aphasia with LKW reported by wife to be 10pm on 10/2.  He arrived at [facility] as code stroke and taken for emergent CT/CTA which demonstrated left M1 occlusion. He received IV tenecteplase and IR thrombectomy due to large vessel occlusion.       DIAGNOSTIC FINDINGS:  MRI 05/22/2022 Cortical ischemia throughout the left MCA territory compatible with recent infarct. Relatively little affect on the white matter. 2. Small focus of acute hemorrhage along the anterior surface of the left temporal lobe Heidelberg classification 3c: Subarachnoid hemorrhage. 3. Normal intracranial MRA.  Restored flow in left MCA.    THERAPY DIAG:  Aphasia  Apraxia  Middle cerebral artery embolism, left  Rationale for  Evaluation and Treatment Rehabilitation  SUBJECTIVE:   SUBJECTIVE STATEMENT: Pt appeared frustrated Pt accompanied by: self and significant other  PAIN:  Are you having pain? No  PATIENT GOALS: to be able to communicate again  OBJECTIVE:   TODAY'S TREATMENT: Skilled treatment session focused on pt's communication goals. SLP facilitated session by providing the following interventions:  Pt appeared more frustrated overall today. His responses were shorter, more impulsive and appeared irritated. His wife reports that he had searched thru multiple drawers at home for a couple of prayers, the drawers became stuck and their dog ran out of the house before pt could grab the leash. This is in addition to having been out of town for the weekend. When prompted to write down information pt benefited from maximal verbal questions and written choices. He continued to state, "I can see, I can't remember, you know what it is" while looking at his wife.   SLP was able to de-escalate pt's frustration by printing "[facility]" and the "Lord's Prayer."  SLP recorded reading of each for pt to read and listen too. Pt's recital of each was ~ 25% intelligible.   PATIENT EDUCATION: Education details: see above Person educated: Patient and Wife Education method: Explanation, Facilities manager, Verbal cues, and Handouts Education comprehension: verbalized understanding  HOME EXERCISE PROGRAM:  Apraxia of Speech word lists   GOALS: Goals reviewed with patient? Yes  SHORT TERM GOALS: Target date: 10 sessions  UPDATED: 01/20/2023  3.   Pt will improve wording finding abilities by writing 3 or more words during communication exchanges to increase listener understanding with moderate A in 5 out of 7 opportunities; ONGOING 5. Pt will improve spelling ability by accurately spelling SEMI-COMPLEX words during communication exchanges to increase listener understanding with min A in 5 out of 7 opportunities; INITIAL 6.   Pt will read self-generated words during communication exchanges with > 50% speech intelligibility given Min A articulatory cues; INITIAL   LONG TERM GOALS: Target date: 01/20/2023   UPDATED 12/13/2022 - see previous notes for progress towards goals  UPDATED 01/20/2023  Pt will use multimodal communication to express basic wants/needs in 4 of 10 opportunities.  Baseline: unable Goal status: ONGOING; ONGOING    2.  Pt will demonstrate increased reading comprehension by selecting accurate answer from 3 choices in 8 out of 10 opportunities.  Goal status: ONGOING; MET (01/20/2023)  ASSESSMENT:  CLINICAL IMPRESSION: Pt is a 77 year old male who was seen today for therapy targeting his aphasia. Pt struggled with word finding during writing today, suspect d/t his increased frustration See treatment note for details.   OBJECTIVE IMPAIRMENTS include expressive language, receptive language, aphasia, and apraxia. These impairments are limiting patient from managing medications, managing appointments, managing finances, household responsibilities, ADLs/IADLs, and effectively communicating at home and in community. Factors affecting potential to achieve goals and functional outcome are  severity of impairments. Patient will benefit from skilled SLP services to address above impairments and improve overall function.  REHAB POTENTIAL: Excellent  PLAN: SLP FREQUENCY: 3x/week  SLP DURATION: 12 weeks  PLANNED INTERVENTIONS: Language facilitation, Environmental controls, Internal/external aids, Functional tasks, Multimodal communication approach, SLP instruction and feedback, Compensatory strategies, and Patient/family education    Jerusalem Brownstein B. Dreama Saa, M.S., CCC-SLP, Tree surgeon Certified Brain Injury Specialist [facility]  [facility] Rehabilitation Services Office [phone] Ascom [phone] Fax [phone]

## 2023-02-05 ENCOUNTER — Ambulatory Visit: Payer: Medicare HMO | Admitting: Speech Pathology

## 2023-02-05 DIAGNOSIS — R4701 Aphasia: Secondary | ICD-10-CM

## 2023-02-05 DIAGNOSIS — R482 Apraxia: Secondary | ICD-10-CM | POA: Diagnosis not present

## 2023-02-05 DIAGNOSIS — I6602 Occlusion and stenosis of left middle cerebral artery: Secondary | ICD-10-CM

## 2023-02-05 NOTE — Therapy (Unsigned)
OUTPATIENT SPEECH LANGUAGE PATHOLOGY TREATMENT NOTE    Patient Name: Alan Stewart MRN: [id number] DOB:[birth year], 77 y.o., male Today's Date: 02/03/2023   PCP: Daniel Nones, MD REFERRING PROVIDER: Daniel Nones, MD   END OF SESSION  End of Session - 02/03/23 1321     Visit Number 86    Number of Visits 117    Date for SLP Re-Evaluation 04/14/23    Authorization Type [employer] H[employer]    Authorization Time Period 11/22/2022 thru 03/21/2023    Authorization - Visit Number 24    Authorization - Number of Visits 44    Progress Note Due on Visit 90    SLP Start Time 1100    SLP Stop Time  1145    SLP Time Calculation (min) 45 min    Activity Tolerance Patient tolerated treatment well                    Past Medical History:  Diagnosis Date   CAD (coronary artery disease)    a. 10/2021 NSTEMI/PCI: LM nl, LAD min irregs, LCX 38m (2.5x18 Onyx Frontier DES), OM1 nl, RCA 48m (3.5x26 Onyx Frontier DES), 30d.   Diastolic dysfunction    a. 10/2021 Echo: EF 50-55%, no rwma, Gr1 DD, nl RV fxn. No significant valvular dzs.   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 SCCis. MOHs 07/08/22   Past Surgical History:  Procedure Laterality Date   COLONOSCOPY N/A 10/09/2015   Procedure: COLONOSCOPY;  Surgeon: Wallace Cullens, MD;  Location: [facility] ENDOSCOPY;  Service: Gastroenterology;  Laterality: N/A;   CORONARY STENT INTERVENTION N/A 10/22/2021   Procedure: CORONARY STENT INTERVENTION;  Surgeon: Iran Ouch, MD;  Location: [facility] INVASIVE CV LAB;  Service: Cardiovascular;  Laterality: N/A;   IR CT HEAD LTD  05/21/2022   IR PERCUTANEOUS ART THROMBECTOMY/INFUSION INTRACRANIAL INC DIAG ANGIO  05/21/2022   LEFT HEART CATH AND CORONARY ANGIOGRAPHY N/A 10/22/2021   Procedure: LEFT  HEART CATH AND CORONARY ANGIOGRAPHY;  Surgeon: Iran Ouch, MD;  Location: [facility] INVASIVE CV LAB;  Service: Cardiovascular;  Laterality: N/A;   LOOP RECORDER INSERTION N/A 05/23/2022   Procedure: LOOP RECORDER INSERTION;  Surgeon: Lanier Prude, MD;  Location: [facility] INVASIVE CV LAB;  Service: Cardiovascular;  Laterality: N/A;   RADIOLOGY WITH ANESTHESIA N/A 05/20/2022   Procedure: IR WITH ANESTHESIA;  Surgeon: Julieanne Cotton, MD;  Location: [facility] OR;  Service: Radiology;  Laterality: N/A;   ROTATOR CUFF REPAIR     Patient Active Problem List   Diagnosis Date Noted   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 male with PMH: HTN, CAD, HLD, osteoarthritis, SCC, sleep apnea. He presented to the hospital on 05/20/22 with right sided weakness and aphasia with LKW reported by wife to be 10pm on 10/2.  He arrived at [facility] as code stroke and taken for emergent CT/CTA which demonstrated left M1 occlusion. He received IV tenecteplase and IR thrombectomy due to large vessel occlusion.       DIAGNOSTIC FINDINGS:  MRI 05/22/2022 Cortical ischemia throughout the left MCA territory compatible with recent infarct. Relatively little affect on the white matter. 2. Small focus of acute hemorrhage along the anterior surface of the left temporal lobe Heidelberg classification 3c: Subarachnoid hemorrhage. 3. Normal intracranial MRA.  Restored flow in left MCA.    THERAPY DIAG:  Aphasia  Apraxia  Middle cerebral artery embolism, left  Rationale for  Evaluation and Treatment Rehabilitation  SUBJECTIVE:   SUBJECTIVE STATEMENT: Pt appeared frustrated Pt accompanied by: self and significant other  PAIN:  Are you having pain? No  PATIENT GOALS: to be able to communicate again  OBJECTIVE:   TODAY'S TREATMENT: Skilled treatment session focused on pt's communication goals. SLP facilitated session by providing the following interventions:  Pt with increased written words/cues during moments of word finding difficulty. Much improved with good results from communication partner.   Reading comprehension, auditory discrimination and speech intelligibility target thru the following:  WALC 6: Reading Comprehension:  Word Discrimination: Two-Word Level p. 230 35/40 Word Discrimination: Two-Word Level with Increased Difficulty/Length of words p.239 - 29/40 Word Identification-Short Sentence Level p. 245 - 32/40  PATIENT EDUCATION: Education details: see above Person educated: Patient and Wife Education method: Explanation, Demonstration, Verbal cues, and Handouts Education comprehension: verbalized understanding  HOME EXERCISE PROGRAM:  Apraxia of Speech word lists   GOALS: Goals reviewed with patient? Yes  SHORT TERM GOALS: Target date: 10 sessions  UPDATED: 01/20/2023  3.   Pt will improve wording finding abilities by writing 3 or more words during communication exchanges to increase listener understanding with moderate A in 5 out of 7 opportunities; ONGOING 5. Pt will improve spelling ability by accurately spelling SEMI-COMPLEX words during communication exchanges to increase listener understanding with min A in 5 out of 7 opportunities; INITIAL 6.  Pt will read self-generated words during communication exchanges with > 50% speech intelligibility given Min A articulatory cues; INITIAL   LONG TERM GOALS: Target date: 01/20/2023   UPDATED 12/13/2022 - see previous notes for progress towards goals  UPDATED 01/20/2023  Pt will  use multimodal communication to express basic wants/needs in 4 of 10 opportunities.  Baseline: unable Goal status: ONGOING; ONGOING    2.  Pt will demonstrate increased reading comprehension by selecting accurate answer from 3 choices in 8 out of 10 opportunities.  Goal status: ONGOING; MET (01/20/2023)  ASSESSMENT:  CLINICAL IMPRESSION: Pt is a 77 year old male who was seen today for therapy targeting his aphasia, specifically auditory discrimination. See treatment note for details.   OBJECTIVE IMPAIRMENTS include expressive language, receptive language, aphasia, and apraxia. These impairments are limiting patient from managing medications, managing appointments, managing finances, household responsibilities, ADLs/IADLs, and effectively communicating at home and in community. Factors affecting potential to achieve goals and functional outcome are severity of impairments. Patient will benefit from skilled SLP services to address above impairments and improve overall function.  REHAB POTENTIAL: Excellent  PLAN: SLP FREQUENCY: 3x/week  SLP DURATION: 12 weeks  PLANNED INTERVENTIONS: Language facilitation, Environmental controls, Internal/external aids, Functional tasks, Multimodal communication approach, SLP instruction and feedback, Compensatory strategies, and Patient/family education    Toby Breithaupt B. Dreama Saa, M.S., CCC-SLP, CBIS Speech-Language Pathologist Certified Brain Injury Specialist [place]  [facility] Rehabilitation Services Office [phone] Ascom  [phone] Fax [phone]

## 2023-02-07 ENCOUNTER — Ambulatory Visit: Payer: Medicare HMO | Admitting: Speech Pathology

## 2023-02-07 DIAGNOSIS — R482 Apraxia: Secondary | ICD-10-CM | POA: Diagnosis not present

## 2023-02-07 DIAGNOSIS — R4701 Aphasia: Secondary | ICD-10-CM | POA: Diagnosis not present

## 2023-02-07 DIAGNOSIS — I6602 Occlusion and stenosis of left middle cerebral artery: Secondary | ICD-10-CM

## 2023-02-07 NOTE — Therapy (Signed)
OUTPATIENT SPEECH LANGUAGE PATHOLOGY TREATMENT NOTE    Patient Name: Alan Stewart MRN: [id number] DOB:[birth year], 77 y.o., male Today's Date: 02/03/2023   PCP: Daniel Nones, MD REFERRING PROVIDER: Daniel Nones, MD   END OF SESSION  End of Session - 02/03/23 1321     Visit Number 86    Number of Visits 117    Date for SLP Re-Evaluation 04/14/23    Authorization Type [employer] H[employer]    Authorization Time Period 11/22/2022 thru 03/21/2023    Authorization - Visit Number 24    Authorization - Number of Visits 44    Progress Note Due on Visit 90    SLP Start Time 1100    SLP Stop Time  1145    SLP Time Calculation (min) 45 min    Activity Tolerance Patient tolerated treatment well                    Past Medical History:  Diagnosis Date   CAD (coronary artery disease)    a. 10/2021 NSTEMI/PCI: LM nl, LAD min irregs, LCX 38m (2.5x18 Onyx Frontier DES), OM1 nl, RCA 48m (3.5x26 Onyx Frontier DES), 30d.   Diastolic dysfunction    a. 10/2021 Echo: EF 50-55%, no rwma, Gr1 DD, nl RV fxn. No significant valvular dzs.   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 SCCis. MOHs 07/08/22   Past Surgical History:  Procedure Laterality Date   COLONOSCOPY N/A 10/09/2015   Procedure: COLONOSCOPY;  Surgeon: Wallace Cullens, MD;  Location: [facility] ENDOSCOPY;  Service: Gastroenterology;  Laterality: N/A;   CORONARY STENT INTERVENTION N/A 10/22/2021   Procedure: CORONARY STENT INTERVENTION;  Surgeon: Iran Ouch, MD;  Location: [facility] INVASIVE CV LAB;  Service: Cardiovascular;  Laterality: N/A;   IR CT HEAD LTD  05/21/2022   IR PERCUTANEOUS ART THROMBECTOMY/INFUSION INTRACRANIAL INC DIAG ANGIO  05/21/2022   LEFT HEART CATH AND CORONARY ANGIOGRAPHY N/A 10/22/2021   Procedure: LEFT  HEART CATH AND CORONARY ANGIOGRAPHY;  Surgeon: Iran Ouch, MD;  Location: [facility] INVASIVE CV LAB;  Service: Cardiovascular;  Laterality: N/A;   LOOP RECORDER INSERTION N/A 05/23/2022   Procedure: LOOP RECORDER INSERTION;  Surgeon: Lanier Prude, MD;  Location: [facility] INVASIVE CV LAB;  Service: Cardiovascular;  Laterality: N/A;   RADIOLOGY WITH ANESTHESIA N/A 05/20/2022   Procedure: IR WITH ANESTHESIA;  Surgeon: Julieanne Cotton, MD;  Location: [facility] OR;  Service: Radiology;  Laterality: N/A;   ROTATOR CUFF REPAIR     Patient Active Problem List   Diagnosis Date Noted   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 male with PMH: HTN, CAD, HLD, osteoarthritis, SCC, sleep apnea. He presented to the hospital on 05/20/22 with right sided weakness and aphasia with LKW reported by wife to be 10pm on 10/2.  He arrived at [facility] as code stroke and taken for emergent CT/CTA which demonstrated left M1 occlusion. He received IV tenecteplase and IR thrombectomy due to large vessel occlusion.       DIAGNOSTIC FINDINGS:  MRI 05/22/2022 Cortical ischemia throughout the left MCA territory compatible with recent infarct. Relatively little affect on the white matter. 2. Small focus of acute hemorrhage along the anterior surface of the left temporal lobe Heidelberg classification 3c: Subarachnoid hemorrhage. 3. Normal intracranial MRA.  Restored flow in left MCA.    THERAPY DIAG:  Aphasia  Apraxia  Middle cerebral artery embolism, left  Rationale for  Evaluation and Treatment Rehabilitation  SUBJECTIVE:   SUBJECTIVE STATEMENT: We forgot his envelope  Pt accompanied by: self and significant other  PAIN:  Are you having pain? No  PATIENT GOALS: to be able to communicate again  OBJECTIVE:   TODAY'S TREATMENT: Skilled treatment session focused on pt's communication goals. SLP facilitated session by providing the following interventions:  Pt with increased written words/cues during moments of word finding difficulty. Much improved with good results from communication partner.   Reading comprehension, auditory discrimination and speech intelligibility target thru the following:  [facility] 6: Reading Comprehension:  Word Discrimination: Three-word level with specifically chosen words by SLP that are vary by one consonant - Identification 34/40 improving to 40/40 with min A; speech intelligibility when repeating spoken word 35/40 with specific difficulty with final consonant clusters  Speech Intelligibility targeted thru SLP provided word list containing final /s/ pt much improved 10/10  PATIENT EDUCATION: Education details: see above Person educated: Patient and Wife Education method: Explanation, Demonstration, Verbal cues, and Handouts Education comprehension: verbalized understanding  HOME EXERCISE PROGRAM:  Apraxia of Speech word lists   GOALS: Goals reviewed with patient? Yes  SHORT TERM GOALS: Target date: 10 sessions  UPDATED: 01/20/2023  3.   Pt will improve wording finding abilities by writing 3 or more words during communication exchanges to increase listener understanding with moderate A in 5 out of 7 opportunities; ONGOING 5. Pt will improve spelling ability by accurately spelling SEMI-COMPLEX words during communication exchanges to increase listener understanding with min A in 5 out of 7 opportunities; INITIAL 6.  Pt will read self-generated words during communication exchanges with > 50% speech intelligibility given  Min A articulatory cues; INITIAL   LONG TERM GOALS: Target date: 01/20/2023   UPDATED 12/13/2022 - see previous notes for progress towards goals  UPDATED 01/20/2023  Pt will use multimodal communication to express basic wants/needs in 4 of 10 opportunities.  Baseline: unable Goal status: ONGOING; ONGOING    2.  Pt will demonstrate increased reading comprehension by selecting accurate answer from 3 choices in 8 out of 10 opportunities.  Goal status: ONGOING; MET (01/20/2023)  ASSESSMENT:  CLINICAL IMPRESSION: Pt is a 77 year old male who was seen today for therapy targeting his aphasia, specifically auditory discrimination and vocabulary development. See treatment note for details.   OBJECTIVE IMPAIRMENTS include expressive language, receptive language, aphasia, and apraxia. These impairments are limiting patient from managing medications, managing appointments, managing finances, household responsibilities, ADLs/IADLs, and effectively communicating at home and in community. Factors affecting potential to achieve goals and functional outcome are severity of impairments. Patient will benefit from skilled SLP services to address above impairments and improve overall function.  REHAB POTENTIAL: Excellent  PLAN: SLP FREQUENCY: 3x/week  SLP DURATION: 12 weeks  PLANNED INTERVENTIONS: Language facilitation, Environmental controls, Internal/external aids, Functional tasks, Multimodal communication approach, SLP instruction and  feedback, Compensatory strategies, and Patient/family education    Shanette Tamargo B. Dreama Saa, M.S., CCC-SLP, Tree surgeon Certified Brain Injury Specialist [facility]  [facility] Rehabilitation Services Office [phone] Ascom [phone] Fax [phone]

## 2023-02-10 ENCOUNTER — Ambulatory Visit: Payer: Medicare HMO | Admitting: Speech Pathology

## 2023-02-10 DIAGNOSIS — R4701 Aphasia: Secondary | ICD-10-CM

## 2023-02-10 DIAGNOSIS — I6602 Occlusion and stenosis of left middle cerebral artery: Secondary | ICD-10-CM | POA: Diagnosis not present

## 2023-02-10 DIAGNOSIS — R482 Apraxia: Secondary | ICD-10-CM | POA: Diagnosis not present

## 2023-02-10 NOTE — Therapy (Signed)
OUTPATIENT SPEECH LANGUAGE PATHOLOGY TREATMENT NOTE    Patient Name: Alan Stewart MRN: [id number] DOB:1946/04/29, 77 y.o., male Today's Date: 02/10/2023   PCP: Daniel Nones, MD REFERRING PROVIDER: Daniel Nones, MD   END OF SESSION  End of Session - 02/10/23 1105     Visit Number 89    Number of Visits 117    Date for SLP Re-Evaluation 04/14/23    Authorization Type [employer] H[employer]    Authorization Time Period 11/22/2022 thru 03/21/2023    Authorization - Visit Number 27    Authorization - Number of Visits 44    Progress Note Due on Visit 90    SLP Start Time 1100    SLP Stop Time  1145    SLP Time Calculation (min) 45 min    Activity Tolerance Patient tolerated treatment well                    Past Medical History:  Diagnosis Date   CAD (coronary artery disease)    a. 10/2021 NSTEMI/PCI: LM nl, LAD min irregs, LCX 44m (2.5x18 Onyx Frontier DES), OM1 nl, RCA 65m (3.5x26 Onyx Frontier DES), 30d.   Diastolic dysfunction    a. 10/2021 Echo: EF 50-55%, no rwma, Gr1 DD, nl RV fxn. No significant valvular dzs.   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 SCCis. MOHs 07/08/22   Past Surgical History:  Procedure Laterality Date   COLONOSCOPY N/A 10/09/2015   Procedure: COLONOSCOPY;  Surgeon: Wallace Cullens, MD;  Location: [facility] ENDOSCOPY;  Service: Gastroenterology;  Laterality: N/A;   CORONARY STENT INTERVENTION N/A 10/22/2021   Procedure: CORONARY STENT INTERVENTION;  Surgeon: Iran Ouch, MD;  Location: [facility] INVASIVE CV LAB;  Service: Cardiovascular;  Laterality: N/A;   IR CT HEAD LTD  05/21/2022   IR PERCUTANEOUS ART THROMBECTOMY/INFUSION INTRACRANIAL INC DIAG ANGIO  05/21/2022   LEFT HEART CATH AND CORONARY ANGIOGRAPHY N/A 10/22/2021   Procedure: LEFT  HEART CATH AND CORONARY ANGIOGRAPHY;  Surgeon: Iran Ouch, MD;  Location: [facility] INVASIVE CV LAB;  Service: Cardiovascular;  Laterality: N/A;   LOOP RECORDER INSERTION N/A 05/23/2022   Procedure: LOOP RECORDER INSERTION;  Surgeon: Lanier Prude, MD;  Location: [facility] INVASIVE CV LAB;  Service: Cardiovascular;  Laterality: N/A;   RADIOLOGY WITH ANESTHESIA N/A 05/20/2022   Procedure: IR WITH ANESTHESIA;  Surgeon: Julieanne Cotton, MD;  Location: [facility] OR;  Service: Radiology;  Laterality: N/A;   ROTATOR CUFF REPAIR     Patient Active Problem List   Diagnosis Date Noted   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 male with PMH: HTN, CAD, HLD, osteoarthritis, SCC, sleep apnea. He presented to the hospital on 05/20/22 with right sided weakness and aphasia with LKW reported by wife to be 10pm on 10/2.  He arrived at [facility] as code stroke and taken for emergent CT/CTA which demonstrated left M1 occlusion. He received IV tenecteplase and IR thrombectomy due to large vessel occlusion.       DIAGNOSTIC FINDINGS:  MRI 05/22/2022 Cortical ischemia throughout the left MCA territory compatible with recent infarct. Relatively little affect on the white matter. 2. Small focus of acute hemorrhage along the anterior surface of the left temporal lobe Heidelberg classification 3c: Subarachnoid hemorrhage. 3. Normal intracranial MRA.  Restored flow in left MCA.    THERAPY DIAG:  Aphasia  Apraxia  Middle cerebral artery embolism, left  Rationale for  Evaluation and Treatment Rehabilitation  SUBJECTIVE:   SUBJECTIVE STATEMENT: We forgot his envelope  Pt accompanied by: self and significant other  PAIN:  Are you having pain? No  PATIENT GOALS: to be able to communicate again  OBJECTIVE:   TODAY'S TREATMENT: Skilled treatment session focused on pt's communication goals. SLP facilitated session by providing the following interventions:  Pt with increased written words/cues during moments of word finding difficulty. Much improved with good results from communication partner.   Reading comprehension and vocabulary development targeted using [facility]: Aphasia Rehab - Unit 3 - vocabulary - 100% accuracy for reading comprehension and moderate phonemic and articulatory cues required to verbal target word intelligibility  Reading comprehension, auditory discrimination and speech intelligibility target thru the following: [facility] 6: Reading Comprehension:  Word Discrimination: Four-Word Level - p 236- 18 out of 20 Word Discrimination: Three-word level with increased difficulty/Length of Words p. 242 15/20 improved to 20/20 with Min a cues Following written Directions p. 262 - 12/20 improving to 18 out of 20 with moderate verbal cues   PATIENT EDUCATION: Education details: see above Person educated: Patient and Wife Education method: Programmer, multimedia, Demonstration, Verbal cues, and Handouts Education comprehension: verbalized understanding  HOME EXERCISE PROGRAM:  Apraxia of Speech word lists   GOALS: Goals reviewed with patient? Yes  SHORT TERM GOALS: Target date: 10 sessions  UPDATED: 01/20/2023  3.   Pt will improve wording finding abilities by writing 3 or more words during communication exchanges to increase listener understanding with moderate A in 5 out of 7 opportunities; ONGOING 5. Pt will improve spelling ability by accurately spelling SEMI-COMPLEX words during communication exchanges to increase listener understanding with min A  in 5 out of 7 opportunities; INITIAL 6.  Pt will read self-generated words during communication exchanges with > 50% speech intelligibility given Min A articulatory cues; INITIAL   LONG TERM GOALS: Target date: 01/20/2023   UPDATED 12/13/2022 - see previous notes for progress towards goals  UPDATED 01/20/2023  Pt will use multimodal communication to express basic wants/needs in 4 of 10 opportunities.  Baseline: unable Goal status: ONGOING; ONGOING    2.  Pt will demonstrate increased reading comprehension by selecting accurate answer from 3 choices in 8 out of 10 opportunities.  Goal status: ONGOING; MET (01/20/2023)  ASSESSMENT:  CLINICAL IMPRESSION: Pt is a 77 year old male who was seen today for therapy targeting his aphasia, specifically auditory discrimination and vocabulary development. See treatment note for details.   OBJECTIVE IMPAIRMENTS include expressive language, receptive language, aphasia, and apraxia. These impairments are limiting patient from managing medications, managing appointments, managing finances, household responsibilities, ADLs/IADLs, and effectively communicating at home and in community. Factors affecting potential to achieve goals and functional outcome are severity of impairments. Patient will benefit from skilled SLP services to address above impairments and improve overall function.  REHAB POTENTIAL: Excellent  PLAN: SLP FREQUENCY: 3x/week  SLP DURATION: 12 weeks  PLANNED INTERVENTIONS: Language facilitation, Environmental controls, Internal/external aids, Functional tasks, Multimodal communication approach, SLP instruction and feedback, Compensatory strategies, and Patient/family education    Yaremi Stahlman B. Dreama Saa, M.S., CCC-SLP, Tree surgeon Certified Brain Injury Specialist [facility]  [facility] Rehabilitation Services Office [phone] Ascom [phone] Fax [phone]

## 2023-02-12 ENCOUNTER — Ambulatory Visit: Payer: Medicare HMO | Admitting: Speech Pathology

## 2023-02-12 DIAGNOSIS — I6602 Occlusion and stenosis of left middle cerebral artery: Secondary | ICD-10-CM

## 2023-02-12 DIAGNOSIS — R482 Apraxia: Secondary | ICD-10-CM | POA: Diagnosis not present

## 2023-02-12 DIAGNOSIS — R4701 Aphasia: Secondary | ICD-10-CM | POA: Diagnosis not present

## 2023-02-12 NOTE — Therapy (Signed)
OUTPATIENT SPEECH LANGUAGE PATHOLOGY TREATMENT NOTE 10th VISIT PROGRESS NOTE    Patient Name: Alan Stewart MRN: [id number] DOB:Feb 17, 1946, 77 y.o., male Today's Date: 02/12/2023   PCP: Daniel Nones, MD REFERRING PROVIDER: Daniel Nones, MD  Speech Therapy Progress Note  Dates of Reporting Period: 01/17/2023 to 02/12/2023  Objective: Patient has been seen for 10 speech therapy sessions this reporting period targeting his moderate aphasia. Patient is making progress toward his STG and LTGs this reporting period. See skilled intervention, clinical impressions, and goals below for details.  END OF SESSION  End of Session - 02/12/23 1129     Visit Number 90    Number of Visits 117    Date for SLP Re-Evaluation 04/14/23    Authorization Type [employer] HMO    Authorization Time Period 11/22/2022 thru 03/21/2023    Authorization - Visit Number 28    Authorization - Number of Visits 44    Progress Note Due on Visit 90    SLP Start Time 1015    SLP Stop Time  1100    SLP Time Calculation (min) 45 min    Activity Tolerance Patient tolerated treatment well                    Past Medical History:  Diagnosis Date   CAD (coronary artery disease)    a. 10/2021 NSTEMI/PCI: LM nl, LAD min irregs, LCX 18m (2.5x18 Onyx Frontier DES), OM1 nl, RCA 34m (3.5x26 Onyx Frontier DES), 30d.   Diastolic dysfunction    a. 10/2021 Echo: EF 50-55%, no rwma, Gr1 DD, nl RV fxn. No significant valvular dzs.   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 SCCis. MOHs 07/08/22   Past Surgical History:  Procedure Laterality Date   COLONOSCOPY N/A 10/09/2015   Procedure: COLONOSCOPY;  Surgeon: Wallace Cullens, MD;  Location: [facility] ENDOSCOPY;  Service: Gastroenterology;  Laterality: N/A;    CORONARY STENT INTERVENTION N/A 10/22/2021   Procedure: CORONARY STENT INTERVENTION;  Surgeon: Iran Ouch, MD;  Location: [facility] INVASIVE CV LAB;  Service: Cardiovascular;  Laterality: N/A;   IR CT HEAD LTD  05/21/2022   IR PERCUTANEOUS ART THROMBECTOMY/INFUSION INTRACRANIAL INC DIAG ANGIO  05/21/2022   LEFT HEART CATH AND CORONARY ANGIOGRAPHY N/A 10/22/2021   Procedure: LEFT HEART CATH AND CORONARY ANGIOGRAPHY;  Surgeon: Iran Ouch, MD;  Location: [facility] INVASIVE CV LAB;  Service: Cardiovascular;  Laterality: N/A;   LOOP RECORDER INSERTION N/A 05/23/2022   Procedure: LOOP RECORDER INSERTION;  Surgeon: Lanier Prude, MD;  Location: [facility] INVASIVE CV LAB;  Service: Cardiovascular;  Laterality: N/A;   RADIOLOGY WITH ANESTHESIA N/A 05/20/2022   Procedure: IR WITH ANESTHESIA;  Surgeon: Julieanne Cotton, MD;  Location: [facility] OR;  Service: Radiology;  Laterality: N/A;   ROTATOR CUFF REPAIR     Patient Active Problem List   Diagnosis Date Noted   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 male with PMH: HTN, CAD, HLD, osteoarthritis, SCC, sleep apnea. He presented to the hospital on 05/20/22 with right sided weakness and aphasia with LKW reported by wife to be 10pm on 10/2.  He arrived at [facility] as code stroke and taken for emergent CT/CTA which demonstrated left M1 occlusion. He received IV tenecteplase and IR thrombectomy due to large vessel occlusion.       DIAGNOSTIC FINDINGS:  MRI 05/22/2022 Cortical ischemia throughout the left MCA  territory compatible with recent infarct. Relatively little affect on the white matter. 2. Small focus of acute hemorrhage along the anterior surface of the left temporal lobe Heidelberg classification 3c: Subarachnoid hemorrhage. 3. Normal intracranial MRA.  Restored flow in left MCA.    THERAPY DIAG:  Aphasia  Apraxia  Middle cerebral artery embolism, left  Rationale for Evaluation and Treatment Rehabilitation  SUBJECTIVE:   PAIN:  Are you having pain? No  PATIENT GOALS: to be able to communicate again  SUBJECTIVE STATEMENT: Pt pleasant, eager Pt accompanied by: self and significant other  OBJECTIVE:   TODAY'S TREATMENT: Skilled treatment session focused on pt's communication goals. SLP facilitated session by providing the following interventions:  To promote functional language use outside of ST session, SLP presented ida for pt to make a written grocery store list prior to going shopping. Pt benefited from maximal to minimal assistance d/t word finding difficulty with written language and inability to generate additional words to help listener. Pt was also noted to have some off-topic but semantically related words that didn't improve listener understanding but promoted confusion.   To aid in word finding, SLP showed pt a picture of a common object with instruction to write down description of object for his wife to use when guessing what the picture was. Pt benefited from maximal instruction. Will continue to assess in hopes that pt's communication will improve.   PATIENT EDUCATION: Education details: see above Person educated: Patient and Wife Education method: Explanation, Facilities manager, Verbal cues, and Handouts Education comprehension: verbalized understanding  HOME EXERCISE PROGRAM:  Apraxia of Speech word lists   GOALS: Goals reviewed with patient? Yes  SHORT TERM GOALS: Target date: 10 sessions  UPDATED: 01/20/2023   UPDATED: 02/12/2023 3.   Pt will  improve wording finding abilities by writing 3 or more words during communication exchanges to increase listener understanding with moderate A in 5 out of 7 opportunities; ONGOING; progress made 5. Pt will improve spelling ability by accurately spelling SEMI-COMPLEX words during communication exchanges to increase listener understanding with min A in 5 out of 7 opportunities; INITIAL; progress made 6.  Pt will read self-generated words during communication exchanges with > 50% speech intelligibility given Min A articulatory cues; INITIAL; progress made   LONG TERM GOALS: Target date: 04/14/2023    UPDATED 01/20/2023  Pt will use multimodal communication to express basic wants/needs in 4 of 10 opportunities.  Baseline: unable Goal status: ONGOING; ONGOING    2.  Pt will demonstrate increased reading comprehension by selecting accurate answer from 3 choices in 8 out of 10 opportunities.  Goal status: ONGOING; MET (01/20/2023)  ASSESSMENT:  CLINICAL IMPRESSION: Pt is a 77 year old male who was seen today for therapy targeting his aphasia, specifically auditory discrimination and vocabulary development. See treatment note for details.   OBJECTIVE IMPAIRMENTS include expressive language, receptive language, aphasia, and apraxia. These  impairments are limiting patient from managing medications, managing appointments, managing finances, household responsibilities, ADLs/IADLs, and effectively communicating at home and in community. Factors affecting potential to achieve goals and functional outcome are severity of impairments. Patient will benefit from skilled SLP services to address above impairments and improve overall function.  REHAB POTENTIAL: Excellent  PLAN: SLP FREQUENCY: 3x/week  SLP DURATION: 12 weeks  PLANNED INTERVENTIONS: Language facilitation, Environmental controls, Internal/external aids, Functional tasks, Multimodal communication approach, SLP instruction and feedback,  Compensatory strategies, and Patient/family education    Kaeley Vinje B. Dreama Saa, M.S., CCC-SLP, Tree surgeon Certified Brain Injury Specialist [facility]  [facility] Rehabilitation Services Office [phone] Ascom [phone] Fax [phone]

## 2023-02-14 ENCOUNTER — Ambulatory Visit: Payer: Medicare HMO | Admitting: Speech Pathology

## 2023-02-14 DIAGNOSIS — R4701 Aphasia: Secondary | ICD-10-CM | POA: Diagnosis not present

## 2023-02-14 DIAGNOSIS — I6602 Occlusion and stenosis of left middle cerebral artery: Secondary | ICD-10-CM | POA: Diagnosis not present

## 2023-02-14 DIAGNOSIS — R482 Apraxia: Secondary | ICD-10-CM

## 2023-02-14 NOTE — Therapy (Signed)
OUTPATIENT SPEECH LANGUAGE PATHOLOGY TREATMENT NOTE    Patient Name: Alan Stewart MRN: [id number] DOB:10-04-1945, 77 y.o., male Today's Date: 02/14/2023   PCP: Daniel Nones, MD REFERRING PROVIDER: Daniel Nones, MD   END OF SESSION  End of Session - 02/14/23 1051     Visit Number 91    Number of Visits 117    Date for SLP Re-Evaluation 04/14/23    Authorization Type [employer] H[employer]    Authorization Time Period 11/22/2022 thru 03/21/2023    Authorization - Visit Number [id number]    Authorization - Number of Visits 44    Progress Note Due on Visit 100    SLP Start Time 0930    SLP Stop Time  1015    SLP Time Calculation (min) 45 min    Activity Tolerance Patient tolerated treatment well                    Past Medical History:  Diagnosis Date   CAD (coronary artery disease)    a. 10/2021 NSTEMI/PCI: LM nl, LAD min irregs, LCX 29m (2.5x18 Onyx Frontier DES), OM1 nl, RCA 73m (3.5x26 Onyx Frontier DES), 30d.   Diastolic dysfunction    a. 10/2021 Echo: EF 50-55%, no rwma, Gr1 DD, nl RV fxn. No significant valvular dzs.   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 SCCis. MOHs 07/08/22   Past Surgical History:  Procedure Laterality Date   COLONOSCOPY N/A 10/09/2015   Procedure: COLONOSCOPY;  Surgeon: Wallace Cullens, MD;  Location: [facility] ENDOSCOPY;  Service: Gastroenterology;  Laterality: N/A;   CORONARY STENT INTERVENTION N/A 10/22/2021   Procedure: CORONARY STENT INTERVENTION;  Surgeon: Iran Ouch, MD;  Location: [facility] INVASIVE CV LAB;  Service: Cardiovascular;  Laterality: N/A;   IR CT HEAD LTD  05/21/2022   IR PERCUTANEOUS ART THROMBECTOMY/INFUSION INTRACRANIAL INC DIAG ANGIO  05/21/2022   LEFT HEART CATH AND CORONARY ANGIOGRAPHY N/A 10/22/2021   Procedure: LEFT  HEART CATH AND CORONARY ANGIOGRAPHY;  Surgeon: Iran Ouch, MD;  Location: [facility] INVASIVE CV LAB;  Service: Cardiovascular;  Laterality: N/A;   LOOP RECORDER INSERTION N/A 05/23/2022   Procedure: LOOP RECORDER INSERTION;  Surgeon: Lanier Prude, MD;  Location: [facility] INVASIVE CV LAB;  Service: Cardiovascular;  Laterality: N/A;   RADIOLOGY WITH ANESTHESIA N/A 05/20/2022   Procedure: IR WITH ANESTHESIA;  Surgeon: Julieanne Cotton, MD;  Location: [facility] OR;  Service: Radiology;  Laterality: N/A;   ROTATOR CUFF REPAIR     Patient Active Problem List   Diagnosis Date Noted   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 male with PMH: HTN, CAD, HLD, osteoarthritis, SCC, sleep apnea. He presented to the hospital on 05/20/22 with right sided weakness and aphasia with LKW reported by wife to be 10pm on 10/2.  He arrived at [facility] as code stroke and taken for emergent CT/CTA which demonstrated left M1 occlusion. He received IV tenecteplase and IR thrombectomy due to large vessel occlusion.       DIAGNOSTIC FINDINGS:  MRI 05/22/2022 Cortical ischemia throughout the left MCA territory compatible with recent infarct. Relatively little affect on the white matter. 2. Small focus of acute hemorrhage along the anterior surface of the left temporal lobe Heidelberg classification 3c: Subarachnoid hemorrhage. 3. Normal intracranial MRA.  Restored flow in left MCA.    THERAPY DIAG:  Aphasia  Apraxia  Middle cerebral artery embolism, left  Rationale for  Evaluation and Treatment Rehabilitation  SUBJECTIVE:   PAIN:  Are you having pain? No  PATIENT GOALS: to be able to communicate again  SUBJECTIVE STATEMENT: Pt appeared tired, reports that he mowed the lawn yesterday Pt accompanied by: self and significant other  OBJECTIVE:   TODAY'S TREATMENT: Skilled treatment session focused on pt's communication goals. SLP facilitated session by providing the following interventions:  When prompted by his wife, pt wrote down "lawn" with more questions, pt wrote down "grass" and with additional SLP let questions, he wrote down "mowing."  SLP further facilitated improved word finding thru maximally cues written object descriptions. Pt reliant on SLP questions regarding object function, shape, size to produce 1 word written answers.   PATIENT EDUCATION: Education details: see above Person educated: Patient and Wife Education method: Explanation, Facilities manager, Verbal cues, and Handouts Education comprehension: verbalized understanding  HOME EXERCISE PROGRAM:  Apraxia of Speech word lists   GOALS: Goals reviewed with patient? Yes  SHORT TERM GOALS: Target date: 10 sessions  UPDATED: 01/20/2023   UPDATED: 02/12/2023 3.   Pt will improve wording finding abilities by writing 3 or more words during communication exchanges to increase listener understanding with moderate A in 5 out of 7 opportunities; ONGOING; progress made 5. Pt will improve spelling ability by accurately spelling SEMI-COMPLEX words during communication exchanges to increase listener understanding with min A in 5 out of 7 opportunities; INITIAL; progress made 6.  Pt will read self-generated words during communication exchanges with > 50% speech intelligibility given Min A articulatory cues; INITIAL; progress made   LONG TERM GOALS: Target date: 04/14/2023    UPDATED 01/20/2023  Pt will use multimodal communication to express basic wants/needs in 4 of 10 opportunities.   Baseline: unable Goal status: ONGOING; ONGOING    2.  Pt will demonstrate increased reading comprehension by selecting accurate answer from 3 choices in 8 out of 10 opportunities.  Goal status: ONGOING; MET (01/20/2023)  ASSESSMENT:  CLINICAL IMPRESSION: Pt is a 77 year old male who was seen today for therapy targeting his aphasia, specifically auditory discrimination and vocabulary development. See treatment note for details.   OBJECTIVE IMPAIRMENTS include expressive language, receptive language, aphasia, and apraxia. These impairments are limiting patient from managing medications, managing appointments, managing finances, household responsibilities, ADLs/IADLs, and effectively communicating at home and in community. Factors affecting potential to achieve goals and functional outcome are severity of impairments. Patient will benefit from skilled SLP services to address above impairments and improve overall function.  REHAB POTENTIAL: Excellent  PLAN: SLP FREQUENCY: 3x/week  SLP DURATION: 12 weeks  PLANNED INTERVENTIONS: Language facilitation, Environmental controls, Internal/external aids, Functional tasks, Multimodal communication approach, SLP instruction and feedback, Compensatory strategies, and Patient/family education    Jazen Spraggins B. Dreama Saa, M.S., CCC-SLP, CBIS Speech-Language Pathologist Certified Brain Injury Specialist [place]  [facility] Rehabilitation Services Office [phone] Ascom  [phone] Fax [phone]

## 2023-02-17 ENCOUNTER — Ambulatory Visit: Payer: Medicare HMO | Attending: Internal Medicine | Admitting: Speech Pathology

## 2023-02-17 DIAGNOSIS — R4701 Aphasia: Secondary | ICD-10-CM | POA: Insufficient documentation

## 2023-02-17 DIAGNOSIS — I6602 Occlusion and stenosis of left middle cerebral artery: Secondary | ICD-10-CM | POA: Diagnosis not present

## 2023-02-17 DIAGNOSIS — R482 Apraxia: Secondary | ICD-10-CM | POA: Insufficient documentation

## 2023-02-17 NOTE — Therapy (Signed)
OUTPATIENT SPEECH LANGUAGE PATHOLOGY TREATMENT NOTE    Patient Name: Alan Stewart MRN: [id number] DOB:February 23, 1946, 77 y.o., male Today's Date: 02/17/2023   PCP: Daniel Nones, MD REFERRING PROVIDER: Daniel Nones, MD   END OF SESSION  End of Session - 02/17/23 1205     Visit Number 92    Number of Visits 117    Date for SLP Re-Evaluation 04/14/23    Authorization Type [employer] H[employer]    Authorization Time Period 11/22/2022 thru 03/21/2023    Authorization - Visit Number 30    Authorization - Number of Visits 44    Progress Note Due on Visit 100    SLP Start Time 1100    SLP Stop Time  1145    SLP Time Calculation (min) 45 min    Activity Tolerance Patient tolerated treatment well                    Past Medical History:  Diagnosis Date   CAD (coronary artery disease)    a. 10/2021 NSTEMI/PCI: LM nl, LAD min irregs, LCX 32m (2.5x18 Onyx Frontier DES), OM1 nl, RCA 83m (3.5x26 Onyx Frontier DES), 30d.   Diastolic dysfunction    a. 10/2021 Echo: EF 50-55%, no rwma, Gr1 DD, nl RV fxn. No significant valvular dzs.   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 SCCis. MOHs 07/08/22   Past Surgical History:  Procedure Laterality Date   COLONOSCOPY N/A 10/09/2015   Procedure: COLONOSCOPY;  Surgeon: Wallace Cullens, MD;  Location: [facility] ENDOSCOPY;  Service: Gastroenterology;  Laterality: N/A;   CORONARY STENT INTERVENTION N/A 10/22/2021   Procedure: CORONARY STENT INTERVENTION;  Surgeon: Iran Ouch, MD;  Location: [facility] INVASIVE CV LAB;  Service: Cardiovascular;  Laterality: N/A;   IR CT HEAD LTD  05/21/2022   IR PERCUTANEOUS ART THROMBECTOMY/INFUSION INTRACRANIAL INC DIAG ANGIO  05/21/2022   LEFT HEART CATH AND CORONARY ANGIOGRAPHY N/A 10/22/2021   Procedure: LEFT  HEART CATH AND CORONARY ANGIOGRAPHY;  Surgeon: Iran Ouch, MD;  Location: [facility] INVASIVE CV LAB;  Service: Cardiovascular;  Laterality: N/A;   LOOP RECORDER INSERTION N/A 05/23/2022   Procedure: LOOP RECORDER INSERTION;  Surgeon: Lanier Prude, MD;  Location: [facility] INVASIVE CV LAB;  Service: Cardiovascular;  Laterality: N/A;   RADIOLOGY WITH ANESTHESIA N/A 05/20/2022   Procedure: IR WITH ANESTHESIA;  Surgeon: Julieanne Cotton, MD;  Location: [facility] OR;  Service: Radiology;  Laterality: N/A;   ROTATOR CUFF REPAIR     Patient Active Problem List   Diagnosis Date Noted   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 male with PMH: HTN, CAD, HLD, osteoarthritis, SCC, sleep apnea. He presented to the hospital on 05/20/22 with right sided weakness and aphasia with LKW reported by wife to be 10pm on 10/2.  He arrived at [facility] as code stroke and taken for emergent CT/CTA which demonstrated left M1 occlusion. He received IV tenecteplase and IR thrombectomy due to large vessel occlusion.       DIAGNOSTIC FINDINGS:  MRI 05/22/2022 Cortical ischemia throughout the left MCA territory compatible with recent infarct. Relatively little affect on the white matter. 2. Small focus of acute hemorrhage along the anterior surface of the left temporal lobe Heidelberg classification 3c: Subarachnoid hemorrhage. 3. Normal intracranial MRA.  Restored flow in left MCA.    THERAPY DIAG:  Aphasia  Middle cerebral artery embolism, left  Rationale for Evaluation  and Treatment Rehabilitation  SUBJECTIVE:   PAIN:  Are you having pain? No  PATIENT GOALS: to be able to communicate again  SUBJECTIVE STATEMENT: Pt appeared tired, reports that he mowed the lawn yesterday Pt accompanied by: self and significant other  OBJECTIVE:   TODAY'S TREATMENT: Skilled treatment session focused on pt's communication goals. SLP facilitated session by providing the following interventions:  Pt and his wife arrived to session with the appearance of frustration. His wife communicates that he continued pointing to the shower this morning and then he demonstrated using his finger to point to the shower. He further indicated thru use of gestures and intermittent words - that he was pointing to the shower so how could she not know if he were asking her if she was going to take a shower.   SLP demonstrated pointing at objects in office with pt unable to understand what SLP might be attempting to communicate. After 2 demonstrates, pt voiced that he understood that he wasn't actually communicating any information. Also engaged pt in some hypothetical sentences such as "I need gas for the lawn mower" with ways to write some words to express same thought such as "gas Materials engineer and "tomorrow we are going to the grocery store" as "tomorrow grocery store?"  With maximal to moderate assistance, pt able to write more descriptive words to describe "sour cream" and "crock pot."  PATIENT EDUCATION: Education details: see above Person educated: Patient and Wife Education method: Explanation, Demonstration, Verbal cues, and Handouts Education comprehension: verbalized understanding  HOME EXERCISE PROGRAM:  Apraxia of Speech word lists   GOALS: Goals reviewed with patient? Yes  SHORT TERM GOALS: Target date: 10 sessions  UPDATED: 01/20/2023   UPDATED: 02/12/2023 3.   Pt will improve wording finding abilities by writing 3 or more words during communication exchanges to increase  listener understanding with moderate A in 5 out of 7 opportunities; ONGOING; progress made 5. Pt will improve spelling ability by accurately spelling SEMI-COMPLEX words during communication exchanges to increase listener understanding with min A in 5 out of 7 opportunities; INITIAL; progress made 6.  Pt will read self-generated words during communication exchanges with > 50% speech intelligibility given Min A articulatory cues; INITIAL; progress made   LONG TERM GOALS: Target date: 04/14/2023    UPDATED 01/20/2023  Pt will use multimodal communication to express basic wants/needs in 4 of 10 opportunities.  Baseline: unable Goal status: ONGOING; ONGOING    2.  Pt will demonstrate increased reading comprehension by selecting accurate answer from 3 choices in 8 out of 10 opportunities.  Goal status: ONGOING; MET (01/20/2023)  ASSESSMENT:  CLINICAL IMPRESSION: Pt is a 77 year old male who was seen today for therapy targeting his aphasia. Specific time spent on improving written communication to aid in communication partner understanding. Pt continues to benefit from moderate cues to expand. See  treatment note for details.   OBJECTIVE IMPAIRMENTS include expressive language, receptive language, aphasia, and apraxia. These impairments are limiting patient from managing medications, managing appointments, managing finances, household responsibilities, ADLs/IADLs, and effectively communicating at home and in community. Factors affecting potential to achieve goals and functional outcome are severity of impairments. Patient will benefit from skilled SLP services to address above impairments and improve overall function.  REHAB POTENTIAL: Excellent  PLAN: SLP FREQUENCY: 3x/week  SLP DURATION: 12 weeks  PLANNED INTERVENTIONS: Language facilitation, Environmental controls, Internal/external aids, Functional tasks, Multimodal communication approach, SLP instruction and feedback, Compensatory  strategies, and Patient/family education    Kristilyn Coltrane B. Dreama Saa, M.S., CCC-SLP, Tree surgeon Certified Brain Injury Specialist [facility]  [facility] Rehabilitation Services Office [phone] Ascom [phone] Fax [phone]

## 2023-02-17 NOTE — Progress Notes (Signed)
[employer] Loop Recorder 

## 2023-02-19 ENCOUNTER — Ambulatory Visit: Payer: Medicare HMO | Admitting: Speech Pathology

## 2023-02-21 ENCOUNTER — Ambulatory Visit: Payer: Medicare HMO | Admitting: Speech Pathology

## 2023-02-24 ENCOUNTER — Ambulatory Visit: Payer: Medicare HMO | Admitting: Speech Pathology

## 2023-02-24 DIAGNOSIS — R4701 Aphasia: Secondary | ICD-10-CM | POA: Diagnosis not present

## 2023-02-24 DIAGNOSIS — I6602 Occlusion and stenosis of left middle cerebral artery: Secondary | ICD-10-CM

## 2023-02-24 DIAGNOSIS — R482 Apraxia: Secondary | ICD-10-CM

## 2023-02-24 NOTE — Therapy (Deleted)
OUTPATIENT SPEECH LANGUAGE PATHOLOGY TREATMENT NOTE    Patient Name: Alan Stewart MRN: [id number] DOB:16-Mar-1946, 77 y.o., male Today's Date: 02/24/2023   PCP: Daniel Nones, MD REFERRING PROVIDER: Daniel Nones, MD   END OF SESSION  End of Session - 02/24/23 1108     Visit Number 93    Number of Visits 117    Date for SLP Re-Evaluation 04/14/23    Authorization Type [employer] HMO    Authorization Time Period 11/22/2022 thru 03/21/2023    Authorization - Visit Number 31    Authorization - Number of Visits 44    Progress Note Due on Visit 100    SLP Start Time 1100    SLP Stop Time  1145    SLP Time Calculation (min) 45 min    Activity Tolerance Patient tolerated treatment well                    Past Medical History:  Diagnosis Date   CAD (coronary artery disease)    a. 10/2021 NSTEMI/PCI: LM nl, LAD min irregs, LCX 30m (2.5x18 Onyx Frontier DES), OM1 nl, RCA 40m (3.5x26 Onyx Frontier DES), 30d.   Diastolic dysfunction    a. 10/2021 Echo: EF 50-55%, no rwma, Gr1 DD, nl RV fxn. No significant valvular dzs.   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 SCCis. MOHs 07/08/22   Past Surgical History:  Procedure Laterality Date   COLONOSCOPY N/A 10/09/2015   Procedure: COLONOSCOPY;  Surgeon: Wallace Cullens, MD;  Location: [facility] ENDOSCOPY;  Service: Gastroenterology;  Laterality: N/A;   CORONARY STENT INTERVENTION N/A 10/22/2021   Procedure: CORONARY STENT INTERVENTION;  Surgeon: Iran Ouch, MD;  Location: [facility] INVASIVE CV LAB;  Service: Cardiovascular;  Laterality: N/A;   IR CT HEAD LTD  05/21/2022   IR PERCUTANEOUS ART THROMBECTOMY/INFUSION INTRACRANIAL INC DIAG ANGIO  05/21/2022   LEFT HEART CATH AND CORONARY ANGIOGRAPHY N/A 10/22/2021   Procedure: LEFT  HEART CATH AND CORONARY ANGIOGRAPHY;  Surgeon: Iran Ouch, MD;  Location: [facility] INVASIVE CV LAB;  Service: Cardiovascular;  Laterality: N/A;   LOOP RECORDER INSERTION N/A 05/23/2022   Procedure: LOOP RECORDER INSERTION;  Surgeon: Lanier Prude, MD;  Location: [facility] INVASIVE CV LAB;  Service: Cardiovascular;  Laterality: N/A;   RADIOLOGY WITH ANESTHESIA N/A 05/20/2022   Procedure: IR WITH ANESTHESIA;  Surgeon: Julieanne Cotton, MD;  Location: [facility] OR;  Service: Radiology;  Laterality: N/A;   ROTATOR CUFF REPAIR     Patient Active Problem List   Diagnosis Date Noted   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 male with PMH: HTN, CAD, HLD, osteoarthritis, SCC, sleep apnea. He presented to the hospital on 05/20/22 with right sided weakness and aphasia with LKW reported by wife to be 10pm on 10/2.  He arrived at [facility] as code stroke and taken for emergent CT/CTA which demonstrated left M1 occlusion. He received IV tenecteplase and IR thrombectomy due to large vessel occlusion.       DIAGNOSTIC FINDINGS:  MRI 05/22/2022 Cortical ischemia throughout the left MCA territory compatible with recent infarct. Relatively little affect on the white matter. 2. Small focus of acute hemorrhage along the anterior surface of the left temporal lobe Heidelberg classification 3c: Subarachnoid hemorrhage. 3. Normal intracranial MRA.  Restored flow in left MCA.    THERAPY DIAG:  Aphasia  Apraxia  Middle cerebral artery embolism, left  Rationale for  Evaluation and Treatment Rehabilitation  SUBJECTIVE:   PAIN:  Are you having pain? No  PATIENT GOALS: to be able to communicate again  SUBJECTIVE STATEMENT: Pt appeared tired, reports that he mowed the lawn yesterday Pt accompanied by: self and significant other  OBJECTIVE:   TODAY'S TREATMENT: Skilled treatment session focused on pt's communication goals. SLP facilitated session by providing the following interventions:  Pt and his wife arrived to session with the appearance of frustration. His wife communicates that he continued pointing to the shower this morning and then he demonstrated using his finger to point to the shower. He further indicated thru use of gestures and intermittent words - that he was pointing to the shower so how could she not know if he were asking her if she was going to take a shower.   SLP demonstrated pointing at objects in office with pt unable to understand what SLP might be attempting to communicate. After 2 demonstrates, pt voiced that he understood that he wasn't actually communicating any information. Also engaged pt in some hypothetical sentences such as "I need gas for the lawn mower" with ways to write some words to express same thought such as "gas Materials engineer and "tomorrow we are going to the grocery store" as "tomorrow grocery store?"  With maximal to moderate assistance, pt able to write more descriptive words to describe "sour cream" and "crock pot."  PATIENT EDUCATION: Education details: see above Person educated: Patient and Wife Education method: Explanation, Demonstration, Verbal cues, and Handouts Education comprehension: verbalized understanding  HOME EXERCISE PROGRAM:  Apraxia of Speech word lists   GOALS: Goals reviewed with patient? Yes  SHORT TERM GOALS: Target date: 10 sessions  UPDATED: 01/20/2023   UPDATED: 02/12/2023 3.   Pt will improve wording finding abilities by writing 3 or more words during communication exchanges  to increase listener understanding with moderate A in 5 out of 7 opportunities; ONGOING; progress made 5. Pt will improve spelling ability by accurately spelling SEMI-COMPLEX words during communication exchanges to increase listener understanding with min A in 5 out of 7 opportunities; INITIAL; progress made 6.  Pt will read self-generated words during communication exchanges with > 50% speech intelligibility given Min A articulatory cues; INITIAL; progress made   LONG TERM GOALS: Target date: 04/14/2023    UPDATED 01/20/2023  Pt will use multimodal communication to express basic wants/needs in 4 of 10 opportunities.  Baseline: unable Goal status: ONGOING; ONGOING    2.  Pt will demonstrate increased reading comprehension by selecting accurate answer from 3 choices in 8 out of 10 opportunities.  Goal status: ONGOING; MET (01/20/2023)  ASSESSMENT:  CLINICAL IMPRESSION: Pt is a 77 year old male who was seen today for therapy targeting his aphasia. Specific time spent on improving written communication to aid in communication partner understanding. Pt continues to benefit from moderate cues to  expand. See treatment note for details.   OBJECTIVE IMPAIRMENTS include expressive language, receptive language, aphasia, and apraxia. These impairments are limiting patient from managing medications, managing appointments, managing finances, household responsibilities, ADLs/IADLs, and effectively communicating at home and in community. Factors affecting potential to achieve goals and functional outcome are severity of impairments. Patient will benefit from skilled SLP services to address above impairments and improve overall function.  REHAB POTENTIAL: Excellent  PLAN: SLP FREQUENCY: 3x/week  SLP DURATION: 12 weeks  PLANNED INTERVENTIONS: Language facilitation, Environmental controls, Internal/external aids, Functional tasks, Multimodal communication approach, SLP instruction and feedback,  Compensatory strategies, and Patient/family education    Tarquin Welcher B. Dreama Saa, M.S., CCC-SLP, Tree surgeon Certified Brain Injury Specialist [facility]  [facility] Rehabilitation Services Office [phone] Ascom [phone] Fax [phone]

## 2023-02-24 NOTE — Therapy (Signed)
OUTPATIENT SPEECH LANGUAGE PATHOLOGY TREATMENT NOTE    Patient Name: Alan Stewart MRN: [id number] DOB:1945-10-25, 77 y.o., male Today's Date: 02/24/2023   PCP: Daniel Nones, MD REFERRING PROVIDER: Daniel Nones, MD   END OF SESSION  End of Session - 02/24/23 1108     Visit Number 93    Number of Visits 117    Date for SLP Re-Evaluation 04/14/23    Authorization Type [employer] H[employer]    Authorization Time Period 11/22/2022 thru 03/21/2023    Authorization - Visit Number 31    Authorization - Number of Visits 44    Progress Note Due on Visit 100    SLP Start Time 1100    SLP Stop Time  1145    SLP Time Calculation (min) 45 min    Activity Tolerance Patient tolerated treatment well                    Past Medical History:  Diagnosis Date   CAD (coronary artery disease)    a. 10/2021 NSTEMI/PCI: LM nl, LAD min irregs, LCX 75m (2.5x18 Onyx Frontier DES), OM1 nl, RCA 32m (3.5x26 Onyx Frontier DES), 30d.   Diastolic dysfunction    a. 10/2021 Echo: EF 50-55%, no rwma, Gr1 DD, nl RV fxn. No significant valvular dzs.   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 SCCis. MOHs 07/08/22   Past Surgical History:  Procedure Laterality Date   COLONOSCOPY N/A 10/09/2015   Procedure: COLONOSCOPY;  Surgeon: Wallace Cullens, MD;  Location: [facility] ENDOSCOPY;  Service: Gastroenterology;  Laterality: N/A;   CORONARY STENT INTERVENTION N/A 10/22/2021   Procedure: CORONARY STENT INTERVENTION;  Surgeon: Iran Ouch, MD;  Location: [facility] INVASIVE CV LAB;  Service: Cardiovascular;  Laterality: N/A;   IR CT HEAD LTD  05/21/2022   IR PERCUTANEOUS ART THROMBECTOMY/INFUSION INTRACRANIAL INC DIAG ANGIO  05/21/2022   LEFT HEART CATH AND CORONARY ANGIOGRAPHY N/A 10/22/2021   Procedure: LEFT  HEART CATH AND CORONARY ANGIOGRAPHY;  Surgeon: Iran Ouch, MD;  Location: [facility] INVASIVE CV LAB;  Service: Cardiovascular;  Laterality: N/A;   LOOP RECORDER INSERTION N/A 05/23/2022   Procedure: LOOP RECORDER INSERTION;  Surgeon: Lanier Prude, MD;  Location: [facility] INVASIVE CV LAB;  Service: Cardiovascular;  Laterality: N/A;   RADIOLOGY WITH ANESTHESIA N/A 05/20/2022   Procedure: IR WITH ANESTHESIA;  Surgeon: Julieanne Cotton, MD;  Location: [facility] OR;  Service: Radiology;  Laterality: N/A;   ROTATOR CUFF REPAIR     Patient Active Problem List   Diagnosis Date Noted   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 male with PMH: HTN, CAD, HLD, osteoarthritis, SCC, sleep apnea. He presented to the hospital on 05/20/22 with right sided weakness and aphasia with LKW reported by wife to be 10pm on 10/2.  He arrived at [facility] as code stroke and taken for emergent CT/CTA which demonstrated left M1 occlusion. He received IV tenecteplase and IR thrombectomy due to large vessel occlusion.       DIAGNOSTIC FINDINGS:  MRI 05/22/2022 Cortical ischemia throughout the left MCA territory compatible with recent infarct. Relatively little affect on the white matter. 2. Small focus of acute hemorrhage along the anterior surface of the left temporal lobe Heidelberg classification 3c: Subarachnoid hemorrhage. 3. Normal intracranial MRA.  Restored flow in left MCA.    THERAPY DIAG:  Aphasia  Apraxia  Middle cerebral artery embolism, left  Rationale for  Evaluation and Treatment Rehabilitation  SUBJECTIVE:   PAIN:  Are you having pain? No  PATIENT GOALS: to be able to communicate again  SUBJECTIVE STATEMENT: Pt appeared tired, reports that he mowed the lawn yesterday Pt accompanied by: self and significant other  OBJECTIVE:   TODAY'S TREATMENT: Skilled treatment session focused on pt's communication goals. SLP facilitated session by providing the following interventions:  Semantic Feature Analysis using written responses from pt targeting basic objects: toothbrush, flashlight, cup, spoon  Pt benefited from maximal multimodal cues to provide answers for physical description, function, location and when object is used. Pt with occasional spelling errors but words were communicatively approximate. Pt with noted difficulty putting verb and object together (clean teeth).   PATIENT EDUCATION: Education details: see above Person educated: Patient and Wife Education method: Explanation, Facilities manager, Verbal cues, and Handouts Education comprehension: verbalized understanding  HOME EXERCISE PROGRAM:  Practice basic semantic feature analysis  GOALS: Goals reviewed with patient? Yes  SHORT TERM GOALS: Target date: 10 sessions  UPDATED: 01/20/2023   UPDATED: 02/12/2023 3.   Pt will improve wording finding abilities by writing 3 or more words during communication exchanges to increase listener understanding with moderate A in 5 out of 7 opportunities; ONGOING; progress made 5. Pt will improve spelling ability by accurately spelling SEMI-COMPLEX words during communication exchanges to increase listener understanding with min A in 5 out of 7 opportunities; INITIAL; progress made 6.  Pt will read self-generated words during communication exchanges with > 50% speech intelligibility given Min A articulatory cues; INITIAL; progress made   LONG TERM GOALS: Target date: 04/14/2023    UPDATED 01/20/2023  Pt will use multimodal communication  to express basic wants/needs in 4 of 10 opportunities.  Baseline: unable Goal status: ONGOING; ONGOING    2.  Pt will demonstrate increased reading comprehension by selecting accurate answer from 3 choices in 8 out of 10 opportunities.  Goal status: ONGOING; MET (01/20/2023)  ASSESSMENT:  CLINICAL IMPRESSION: Pt is a 77 year old male who was seen today for therapy targeting his aphasia. Specific time spent on improving written communication to aid in communication partner understanding thru use of semantic feature analysis. See treatment note for details.   OBJECTIVE IMPAIRMENTS include expressive language, receptive language, aphasia, and apraxia. These impairments are limiting patient from managing medications, managing appointments, managing finances, household responsibilities, ADLs/IADLs, and effectively communicating at home and in community. Factors affecting potential to achieve goals and functional outcome are severity of impairments. Patient will benefit from skilled SLP services to address above impairments and improve overall function.  REHAB POTENTIAL: Excellent  PLAN: SLP FREQUENCY: 3x/week  SLP DURATION: 12 weeks  PLANNED INTERVENTIONS: Language facilitation, Environmental controls, Internal/external aids, Functional tasks, Multimodal communication approach, SLP instruction and feedback, Compensatory strategies, and Patient/family education    Torrance Stockley B. Dreama Saa, M.S., CCC-SLP, CBIS Speech-Language Pathologist Certified Brain Injury  Specialist [facility]  [facility] Rehabilitation Services Office [phone] Ascom [phone] Fax [phone]

## 2023-02-26 ENCOUNTER — Ambulatory Visit: Payer: Medicare HMO | Admitting: Speech Pathology

## 2023-02-26 DIAGNOSIS — R4701 Aphasia: Secondary | ICD-10-CM | POA: Diagnosis not present

## 2023-02-26 DIAGNOSIS — R482 Apraxia: Secondary | ICD-10-CM | POA: Diagnosis not present

## 2023-02-26 DIAGNOSIS — I6602 Occlusion and stenosis of left middle cerebral artery: Secondary | ICD-10-CM | POA: Diagnosis not present

## 2023-02-26 NOTE — Therapy (Unsigned)
OUTPATIENT SPEECH LANGUAGE PATHOLOGY TREATMENT NOTE    Patient Name: Alan Stewart MRN: [id number] DOB:1945/09/10, 77 y.o., male Today's Date: 02/26/2023   PCP: Daniel Nones, MD REFERRING PROVIDER: Daniel Nones, MD   END OF SESSION  End of Session - 02/26/23 1439     Visit Number 94    Number of Visits 117    Date for SLP Re-Evaluation 04/14/23    Authorization Type [employer] H[employer]    Authorization Time Period 11/22/2022 thru 03/21/2023    Authorization - Visit Number 32    Authorization - Number of Visits 44    Progress Note Due on Visit 100    SLP Start Time 1015    SLP Stop Time  1100    SLP Time Calculation (min) 45 min    Activity Tolerance Patient tolerated treatment well              Past Medical History:  Diagnosis Date   CAD (coronary artery disease)    a. 10/2021 NSTEMI/PCI: LM nl, LAD min irregs, LCX 34m (2.5x18 Onyx Frontier DES), OM1 nl, RCA 15m (3.5x26 Onyx Frontier DES), 30d.   Diastolic dysfunction    a. 10/2021 Echo: EF 50-55%, no rwma, Gr1 DD, nl RV fxn. No significant valvular dzs.   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 SCCis. MOHs 07/08/22   Past Surgical History:  Procedure Laterality Date   COLONOSCOPY N/A 10/09/2015   Procedure: COLONOSCOPY;  Surgeon: Wallace Cullens, MD;  Location: [facility] ENDOSCOPY;  Service: Gastroenterology;  Laterality: N/A;   CORONARY STENT INTERVENTION N/A 10/22/2021   Procedure: CORONARY STENT INTERVENTION;  Surgeon: Iran Ouch, MD;  Location: [facility] INVASIVE CV LAB;  Service: Cardiovascular;  Laterality: N/A;   IR CT HEAD LTD  05/21/2022   IR PERCUTANEOUS ART THROMBECTOMY/INFUSION INTRACRANIAL INC DIAG ANGIO  05/21/2022   LEFT HEART CATH AND CORONARY ANGIOGRAPHY N/A 10/22/2021   Procedure: LEFT HEART CATH AND  CORONARY ANGIOGRAPHY;  Surgeon: Iran Ouch, MD;  Location: [facility] INVASIVE CV LAB;  Service: Cardiovascular;  Laterality: N/A;   LOOP RECORDER INSERTION N/A 05/23/2022   Procedure: LOOP RECORDER INSERTION;  Surgeon: Lanier Prude, MD;  Location: [facility] INVASIVE CV LAB;  Service: Cardiovascular;  Laterality: N/A;   RADIOLOGY WITH ANESTHESIA N/A 05/20/2022   Procedure: IR WITH ANESTHESIA;  Surgeon: Julieanne Cotton, MD;  Location: [facility] OR;  Service: Radiology;  Laterality: N/A;   ROTATOR CUFF REPAIR     Patient Active Problem List   Diagnosis Date Noted   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 male with PMH: HTN, CAD, HLD, osteoarthritis, SCC, sleep apnea. He presented to the hospital on 05/20/22 with right sided weakness and aphasia with LKW reported by wife to be 10pm on 10/2.  He arrived at [facility]  as code stroke and taken for emergent CT/CTA which demonstrated left M1 occlusion. He received IV tenecteplase and IR thrombectomy due to large vessel occlusion.       DIAGNOSTIC FINDINGS:  MRI 05/22/2022 Cortical ischemia throughout the left MCA territory compatible with recent infarct. Relatively little affect on the white matter. 2. Small focus of acute hemorrhage along the anterior surface of the left temporal lobe Heidelberg classification 3c: Subarachnoid hemorrhage. 3. Normal intracranial MRA.  Restored flow in left MCA.    THERAPY DIAG:  Aphasia  Apraxia  Middle cerebral artery embolism, left  Rationale for Evaluation and  Treatment Rehabilitation  SUBJECTIVE:   PAIN:  Are you having pain? No  PATIENT GOALS: to be able to communicate again  SUBJECTIVE STATEMENT: Pt arrived to session by himself but with gestured he was  Pt accompanied by: self and significant other  OBJECTIVE:   TODAY'S TREATMENT: Skilled treatment session focused on pt's communication goals. SLP facilitated session by providing the following interventions:  SLP begin session by showing pt pictures of basic objects in effort to engage pt in [employer]. Pt with difficulty understanding task and continued to attempt to say name of object. Pt pleased with initial ability to state name of object therefore pt assessed with naming 10 pictures of basic familiar objects. Pt able to name 2 of 10 objects independently improving to 10 of 10 with sentence completion d/t perseveration. Pt was aware of perseveration and frustrated by it. Education and support provided. Pt able to write each object name accurately.   SLP provided pt with functional scenarios targeting vague requests (hand me that, that right there, that) along with vague written request (door) vs written description of door, black, dry to indicate that I was requesting the black umbrella hanging on the door. Pt appeared to have more awareness of his requests (vague) vs written requests with additional information (from [employer]).   PATIENT EDUCATION: Education details: see above Person educated: Patient and Wife Education method: Explanation, Facilities manager, Verbal cues, and Handouts Education comprehension: verbalized understanding  HOME EXERCISE PROGRAM:  Practice basic semantic feature analysis  GOALS: Goals reviewed with patient? Yes  SHORT TERM GOALS: Target date: 10 sessions  UPDATED: 01/20/2023   UPDATED: 02/12/2023 3.   Pt will improve wording finding abilities by writing 3 or more words during communication exchanges to increase listener  understanding with moderate A in 5 out of 7 opportunities; ONGOING; progress made 5. Pt will improve spelling ability by accurately spelling SEMI-COMPLEX words during communication exchanges to increase listener understanding with min A in 5 out of 7 opportunities; INITIAL; progress made 6.  Pt will read self-generated words during communication exchanges with > 50% speech intelligibility given Min A articulatory cues; INITIAL; progress made   LONG TERM GOALS: Target date: 04/14/2023    UPDATED 01/20/2023  Pt will use multimodal communication to express basic wants/needs in 4 of 10 opportunities.  Baseline: unable Goal status: ONGOING; ONGOING    2.  Pt will demonstrate increased reading comprehension by selecting accurate answer from 3 choices in 8 out of 10 opportunities.  Goal status: ONGOING; MET (01/20/2023)  ASSESSMENT:  CLINICAL IMPRESSION: Pt is a 77 year old male who was seen today for therapy targeting his aphasia. Specific time spent on improving written communication to aid in communication partner understanding thru use of semantic feature analysis. See treatment note for details.   OBJECTIVE IMPAIRMENTS include expressive language, receptive language, aphasia, and apraxia. These impairments are  limiting patient from managing medications, managing appointments, managing finances, household responsibilities, ADLs/IADLs, and effectively communicating at home and in community. Factors affecting potential to achieve goals and functional outcome are severity of impairments. Patient will benefit from skilled SLP services to address above impairments and improve overall function.  REHAB POTENTIAL: Excellent  PLAN: SLP FREQUENCY: 3x/week  SLP DURATION: 12 weeks  PLANNED INTERVENTIONS: Language facilitation, Environmental controls, Internal/external aids, Functional tasks, Multimodal communication approach, SLP instruction and feedback, Compensatory strategies, and Patient/family  education    Marinna Blane B. Dreama Saa, M.S., CCC-SLP, Tree surgeon Certified Brain Injury Specialist [facility]  [facility] Rehabilitation Services Office [phone] Ascom [phone] Fax [phone]

## 2023-02-27 ENCOUNTER — Ambulatory Visit (INDEPENDENT_AMBULATORY_CARE_PROVIDER_SITE_OTHER): Payer: Medicare HMO

## 2023-02-27 DIAGNOSIS — I63412 Cerebral infarction due to embolism of left middle cerebral artery: Secondary | ICD-10-CM

## 2023-02-27 LAB — CUP PACEART REMOTE DEVICE CHECK
Date Time Interrogation Session: 20240711092210
Implantable Pulse Generator Implant Date: 20231005
Pulse Gen Model: 450218
Pulse Gen Serial Number: 95054077

## 2023-02-28 ENCOUNTER — Ambulatory Visit: Payer: Medicare HMO | Admitting: Speech Pathology

## 2023-02-28 DIAGNOSIS — R4701 Aphasia: Secondary | ICD-10-CM

## 2023-02-28 DIAGNOSIS — I6602 Occlusion and stenosis of left middle cerebral artery: Secondary | ICD-10-CM

## 2023-02-28 DIAGNOSIS — R482 Apraxia: Secondary | ICD-10-CM | POA: Diagnosis not present

## 2023-02-28 NOTE — Therapy (Signed)
OUTPATIENT SPEECH LANGUAGE PATHOLOGY TREATMENT NOTE    Patient Name: Alan Stewart MRN: [id number] DOB:03/26/1946, 77 y.o., male Today's Date: 02/28/2023   PCP: Daniel Nones, MD REFERRING PROVIDER: Daniel Nones, MD   END OF SESSION  End of Session - 02/28/23 1016     Visit Number 95    Number of Visits 117    Date for SLP Re-Evaluation 04/14/23    Authorization Type [employer] H[employer]    Authorization Time Period 11/22/2022 thru 03/21/2023    Authorization - Visit Number 33    Authorization - Number of Visits 44    Progress Note Due on Visit 100    SLP Start Time 1015    SLP Stop Time  1100    SLP Time Calculation (min) 45 min    Activity Tolerance Patient tolerated treatment well              Past Medical History:  Diagnosis Date   CAD (coronary artery disease)    a. 10/2021 NSTEMI/PCI: LM nl, LAD min irregs, LCX 46m (2.5x18 Onyx Frontier DES), OM1 nl, RCA 68m (3.5x26 Onyx Frontier DES), 30d.   Diastolic dysfunction    a. 10/2021 Echo: EF 50-55%, no rwma, Gr1 DD, nl RV fxn. No significant valvular dzs.   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 SCCis. MOHs 07/08/22   Past Surgical History:  Procedure Laterality Date   COLONOSCOPY N/A 10/09/2015   Procedure: COLONOSCOPY;  Surgeon: Wallace Cullens, MD;  Location: [facility] ENDOSCOPY;  Service: Gastroenterology;  Laterality: N/A;   CORONARY STENT INTERVENTION N/A 10/22/2021   Procedure: CORONARY STENT INTERVENTION;  Surgeon: Iran Ouch, MD;  Location: [facility] INVASIVE CV LAB;  Service: Cardiovascular;  Laterality: N/A;   IR CT HEAD LTD  05/21/2022   IR PERCUTANEOUS ART THROMBECTOMY/INFUSION INTRACRANIAL INC DIAG ANGIO  05/21/2022   LEFT HEART CATH AND CORONARY ANGIOGRAPHY N/A 10/22/2021   Procedure: LEFT HEART CATH AND  CORONARY ANGIOGRAPHY;  Surgeon: Iran Ouch, MD;  Location: [facility] INVASIVE CV LAB;  Service: Cardiovascular;  Laterality: N/A;   LOOP RECORDER INSERTION N/A 05/23/2022   Procedure: LOOP RECORDER INSERTION;  Surgeon: Lanier Prude, MD;  Location: [facility] INVASIVE CV LAB;  Service: Cardiovascular;  Laterality: N/A;   RADIOLOGY WITH ANESTHESIA N/A 05/20/2022   Procedure: IR WITH ANESTHESIA;  Surgeon: Julieanne Cotton, MD;  Location: [facility] OR;  Service: Radiology;  Laterality: N/A;   ROTATOR CUFF REPAIR     Patient Active Problem List   Diagnosis Date Noted   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 male with PMH: HTN, CAD, HLD, osteoarthritis, SCC, sleep apnea. He presented to the hospital on 05/20/22 with right sided weakness and aphasia with LKW reported by wife to be 10pm on 10/2.  He arrived at [facility]  as code stroke and taken for emergent CT/CTA which demonstrated left M1 occlusion. He received IV tenecteplase and IR thrombectomy due to large vessel occlusion.       DIAGNOSTIC FINDINGS:  MRI 05/22/2022 Cortical ischemia throughout the left MCA territory compatible with recent infarct. Relatively little affect on the white matter. 2. Small focus of acute hemorrhage along the anterior surface of the left temporal lobe Heidelberg classification 3c: Subarachnoid hemorrhage. 3. Normal intracranial MRA.  Restored flow in left MCA.    THERAPY DIAG:  Aphasia  Middle cerebral artery embolism, left  Rationale for Evaluation and Treatment  Rehabilitation  SUBJECTIVE:   PAIN:  Are you having pain? No  PATIENT GOALS: to be able to communicate again  SUBJECTIVE STATEMENT: Pt arrived to session by himself but with gestured he was  Pt accompanied by: self and significant other  OBJECTIVE:   TODAY'S TREATMENT: Skilled treatment session focused on pt's communication goals. SLP facilitated session by providing the following interventions:  SLP provided pt with functional scenarios targeting vague requests (hand me that, that right there, that) along with vague written request (door) vs written description of door, black, dry to indicate that I was requesting the black umbrella hanging on the door. Pt appeared to have more awareness of his requests (vague) vs written requests with additional information (from [employer]).   When pt attempted to describe the lawnmower that he wished to purchase, he required maximal cues to state riding vs. Push mower. He wrote - lawnmower, gas, power, after SLP instruction pt was able to "see" what cues were leading too.   PATIENT EDUCATION: Education details: see above Person educated: Patient and Wife Education method: Explanation, Facilities manager, Verbal cues, and Handouts Education comprehension: verbalized understanding  HOME EXERCISE PROGRAM:  Practice basic semantic feature analysis  GOALS: Goals reviewed with patient? Yes  SHORT TERM GOALS: Target date: 10 sessions  UPDATED: 01/20/2023   UPDATED: 02/12/2023 3.   Pt will improve wording finding abilities by writing 3 or more words during communication exchanges to increase listener understanding with moderate A in 5 out of 7 opportunities; ONGOING; progress made 5. Pt will improve spelling ability by accurately spelling SEMI-COMPLEX words during communication exchanges to increase listener understanding with min A in 5 out of 7 opportunities; INITIAL; progress made 6.  Pt will read self-generated words during  communication exchanges with > 50% speech intelligibility given Min A articulatory cues; INITIAL; progress made   LONG TERM GOALS: Target date: 04/14/2023    UPDATED 01/20/2023  Pt will use multimodal communication to express basic wants/needs in 4 of 10 opportunities.  Baseline: unable Goal status: ONGOING; ONGOING    2.  Pt will demonstrate increased reading comprehension by selecting accurate answer from 3 choices in 8 out of 10 opportunities.  Goal status: ONGOING; MET (01/20/2023)  ASSESSMENT:  CLINICAL IMPRESSION: Pt is a 77 year old male who was seen today for therapy targeting his aphasia. Specific time spent on improving written communication to aid in communication partner understanding thru use of semantic feature analysis. Continue practice needed. See treatment note for details.   OBJECTIVE IMPAIRMENTS include expressive language, receptive language, aphasia, and apraxia. These impairments are limiting patient from managing medications, managing appointments, managing finances, household responsibilities, ADLs/IADLs, and effectively communicating at home and in community. Factors affecting potential to achieve goals and functional outcome are severity of impairments. Patient will benefit from skilled SLP services to address above impairments and improve overall function.  REHAB POTENTIAL: Excellent  PLAN: SLP FREQUENCY: 3x/week  SLP DURATION: 12 weeks  PLANNED INTERVENTIONS: Language facilitation, Environmental controls, Internal/external aids, Functional tasks, Multimodal communication approach, SLP instruction and feedback, Compensatory strategies, and Patient/family education    Keta Vanvalkenburgh B. Dreama Saa, M.S., CCC-SLP, Tree surgeon Certified Brain Injury Specialist [facility]  [facility] Rehabilitation Services Office [phone] Ascom [phone] Fax [phone]

## 2023-03-03 ENCOUNTER — Ambulatory Visit: Payer: Medicare HMO | Admitting: Speech Pathology

## 2023-03-03 DIAGNOSIS — R4701 Aphasia: Secondary | ICD-10-CM | POA: Diagnosis not present

## 2023-03-03 DIAGNOSIS — R482 Apraxia: Secondary | ICD-10-CM | POA: Diagnosis not present

## 2023-03-03 DIAGNOSIS — I6602 Occlusion and stenosis of left middle cerebral artery: Secondary | ICD-10-CM | POA: Diagnosis not present

## 2023-03-03 NOTE — Therapy (Signed)
OUTPATIENT SPEECH LANGUAGE PATHOLOGY TREATMENT NOTE    Patient Name: Alan Stewart MRN: [id number] DOB:11-07-1945, 77 y.o., male Today's Date: 03/03/2023   PCP: Daniel Nones, MD REFERRING PROVIDER: Daniel Nones, MD   END OF SESSION  End of Session - 03/03/23 1101     Visit Number 96    Number of Visits 117    Date for SLP Re-Evaluation 04/14/23    Authorization Type [employer] H[employer]    Authorization Time Period 11/22/2022 thru 03/21/2023    Authorization - Visit Number [id number]    Authorization - Number of Visits 44    Progress Note Due on Visit 100    SLP Start Time 1100    SLP Stop Time  1145    SLP Time Calculation (min) 45 min    Activity Tolerance Patient tolerated treatment well              Past Medical History:  Diagnosis Date   CAD (coronary artery disease)    a. 10/2021 NSTEMI/PCI: LM nl, LAD min irregs, LCX 54m (2.5x18 Onyx Frontier DES), OM1 nl, RCA 19m (3.5x26 Onyx Frontier DES), 30d.   Diastolic dysfunction    a. 10/2021 Echo: EF 50-55%, no rwma, Gr1 DD, nl RV fxn. No significant valvular dzs.   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 SCCis. MOHs 07/08/22   Past Surgical History:  Procedure Laterality Date   COLONOSCOPY N/A 10/09/2015   Procedure: COLONOSCOPY;  Surgeon: Wallace Cullens, MD;  Location: [facility] ENDOSCOPY;  Service: Gastroenterology;  Laterality: N/A;   CORONARY STENT INTERVENTION N/A 10/22/2021   Procedure: CORONARY STENT INTERVENTION;  Surgeon: Iran Ouch, MD;  Location: [facility] INVASIVE CV LAB;  Service: Cardiovascular;  Laterality: N/A;   IR CT HEAD LTD  05/21/2022   IR PERCUTANEOUS ART THROMBECTOMY/INFUSION INTRACRANIAL INC DIAG ANGIO  05/21/2022   LEFT HEART CATH AND CORONARY ANGIOGRAPHY N/A 10/22/2021   Procedure: LEFT HEART CATH AND  CORONARY ANGIOGRAPHY;  Surgeon: Iran Ouch, MD;  Location: [facility] INVASIVE CV LAB;  Service: Cardiovascular;  Laterality: N/A;   LOOP RECORDER INSERTION N/A 05/23/2022   Procedure: LOOP RECORDER INSERTION;  Surgeon: Lanier Prude, MD;  Location: [facility] INVASIVE CV LAB;  Service: Cardiovascular;  Laterality: N/A;   RADIOLOGY WITH ANESTHESIA N/A 05/20/2022   Procedure: IR WITH ANESTHESIA;  Surgeon: Julieanne Cotton, MD;  Location: [facility] OR;  Service: Radiology;  Laterality: N/A;   ROTATOR CUFF REPAIR     Patient Active Problem List   Diagnosis Date Noted   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 male with PMH: HTN, CAD, HLD, osteoarthritis, SCC, sleep apnea. He presented to the hospital on 05/20/22 with right sided weakness and aphasia with LKW reported by wife to be 10pm on 10/2.  He arrived at [facility]  as code stroke and taken for emergent CT/CTA which demonstrated left M1 occlusion. He received IV tenecteplase and IR thrombectomy due to large vessel occlusion.       DIAGNOSTIC FINDINGS:  MRI 05/22/2022 Cortical ischemia throughout the left MCA territory compatible with recent infarct. Relatively little affect on the white matter. 2. Small focus of acute hemorrhage along the anterior surface of the left temporal lobe Heidelberg classification 3c: Subarachnoid hemorrhage. 3. Normal intracranial MRA.  Restored flow in left MCA.    THERAPY DIAG:  Aphasia  Apraxia  Middle cerebral artery embolism, left  Rationale for Evaluation and  Treatment Rehabilitation  SUBJECTIVE:   PAIN:  Are you having pain? No  PATIENT GOALS: to be able to communicate again  SUBJECTIVE STATEMENT: Pt reports getting a new lawnmower Pt accompanied by: self and significant other  OBJECTIVE:   TODAY'S TREATMENT: Skilled treatment session focused on pt's communication goals. SLP facilitated session by providing the following interventions:  SLP trained semantic feature analysis (SFA) with visual aid. Initial training with SLP provided x5 words related to work tasks, patient provided x3 features with moderate contextual/questions cues to verbalize features. Improved carry over of task as patient with moderate/maximal provided x3 features in 25% of x8 opportunities. 50% (4/8) word opportunities patient moderate/maximal provided 2 of 3 features for word finding and 25% (2/8) patient IND provided 1/3 features. Patient improved to provided total x3 features in all opportunities with moderate assistance via questions and visual cues to provide a faciliator (feature) word for word finding. SLP modeled SFA and provided x3 features for patient to complete word finding--100% accuracy demonstrated.   PATIENT EDUCATION: Education details: see above Person educated: Patient and Wife Education method: Explanation, Facilities manager, Verbal cues, and Handouts Education comprehension: verbalized understanding  HOME EXERCISE PROGRAM:  Practice basic semantic feature analysis  GOALS: Goals reviewed with patient? Yes  SHORT TERM GOALS: Target date: 10 sessions  UPDATED: 01/20/2023   UPDATED: 02/12/2023 3.   Pt will improve wording finding abilities by writing 3 or more words during communication exchanges to increase listener understanding with moderate A in 5 out of 7 opportunities; ONGOING; progress made 5. Pt will improve spelling ability by accurately spelling SEMI-COMPLEX words during communication exchanges to increase listener understanding with min A in  5 out of 7 opportunities; INITIAL; progress made 6.  Pt will read self-generated words during communication exchanges with > 50% speech intelligibility given Min A articulatory cues; INITIAL; progress made   LONG TERM GOALS: Target date: 04/14/2023    UPDATED 01/20/2023  Pt will use multimodal communication to express basic wants/needs in 4 of 10 opportunities.  Baseline: unable Goal status: ONGOING; ONGOING    2.  Pt will demonstrate increased reading comprehension by selecting accurate answer from 3 choices in 8 out of 10 opportunities.  Goal status: ONGOING; MET (01/20/2023)  ASSESSMENT:  CLINICAL IMPRESSION: Pt is a 77 year old male who was seen today for therapy targeting his aphasia. Specific time spent on improving written communication to aid in communication partner understanding thru use of semantic feature analysis. Continue practice needed. See treatment note for details.   OBJECTIVE IMPAIRMENTS include expressive language, receptive language, aphasia, and apraxia. These impairments are limiting patient from managing medications, managing appointments, managing finances, household responsibilities, ADLs/IADLs, and effectively communicating at home and in community. Factors affecting potential to achieve goals and functional outcome are severity of impairments. Patient will benefit from skilled SLP services to address above impairments and improve overall function.  REHAB POTENTIAL: Excellent  PLAN: SLP FREQUENCY: 3x/week  SLP DURATION: 12 weeks  PLANNED INTERVENTIONS: Language facilitation, Environmental controls, Internal/external aids, Functional tasks, Multimodal communication approach, SLP instruction and feedback, Compensatory strategies, and Patient/family education    Suhaila Troiano B. Dreama Saa, M.S., CCC-SLP, Tree surgeon Certified Brain Injury Specialist [facility]  [facility] Rehabilitation Services Office  [phone] Ascom [phone] Fax [phone]

## 2023-03-05 ENCOUNTER — Ambulatory Visit: Payer: Medicare HMO | Admitting: Speech Pathology

## 2023-03-05 DIAGNOSIS — R482 Apraxia: Secondary | ICD-10-CM | POA: Diagnosis not present

## 2023-03-05 DIAGNOSIS — I6602 Occlusion and stenosis of left middle cerebral artery: Secondary | ICD-10-CM | POA: Diagnosis not present

## 2023-03-05 DIAGNOSIS — R4701 Aphasia: Secondary | ICD-10-CM

## 2023-03-05 NOTE — Therapy (Signed)
OUTPATIENT SPEECH LANGUAGE PATHOLOGY TREATMENT NOTE    Patient Name: Alan Stewart MRN: [id number] DOB:08/05/1946, 77 y.o., male Today's Date: 03/05/2023   PCP: Daniel Nones, MD REFERRING PROVIDER: Daniel Nones, MD   END OF SESSION  End of Session - 03/05/23 1245     Visit Number 97    Number of Visits 117    Date for SLP Re-Evaluation 04/14/23    Authorization Type [employer] H[employer]    Authorization Time Period 11/22/2022 thru 03/21/2023    Authorization - Visit Number 35    Authorization - Number of Visits 44    Progress Note Due on Visit 100    SLP Start Time 1015    SLP Stop Time  1115    SLP Time Calculation (min) 60 min    Activity Tolerance Patient tolerated treatment well              Past Medical History:  Diagnosis Date   CAD (coronary artery disease)    a. 10/2021 NSTEMI/PCI: LM nl, LAD min irregs, LCX 46m (2.5x18 Onyx Frontier DES), OM1 nl, RCA 75m (3.5x26 Onyx Frontier DES), 30d.   Diastolic dysfunction    a. 10/2021 Echo: EF 50-55%, no rwma, Gr1 DD, nl RV fxn. No significant valvular dzs.   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 SCCis. MOHs 07/08/22   Past Surgical History:  Procedure Laterality Date   COLONOSCOPY N/A 10/09/2015   Procedure: COLONOSCOPY;  Surgeon: Wallace Cullens, MD;  Location: [facility] ENDOSCOPY;  Service: Gastroenterology;  Laterality: N/A;   CORONARY STENT INTERVENTION N/A 10/22/2021   Procedure: CORONARY STENT INTERVENTION;  Surgeon: Iran Ouch, MD;  Location: [facility] INVASIVE CV LAB;  Service: Cardiovascular;  Laterality: N/A;   IR CT HEAD LTD  05/21/2022   IR PERCUTANEOUS ART THROMBECTOMY/INFUSION INTRACRANIAL INC DIAG ANGIO  05/21/2022   LEFT HEART CATH AND CORONARY ANGIOGRAPHY N/A 10/22/2021   Procedure: LEFT HEART CATH AND  CORONARY ANGIOGRAPHY;  Surgeon: Iran Ouch, MD;  Location: [facility] INVASIVE CV LAB;  Service: Cardiovascular;  Laterality: N/A;   LOOP RECORDER INSERTION N/A 05/23/2022   Procedure: LOOP RECORDER INSERTION;  Surgeon: Lanier Prude, MD;  Location: [facility] INVASIVE CV LAB;  Service: Cardiovascular;  Laterality: N/A;   RADIOLOGY WITH ANESTHESIA N/A 05/20/2022   Procedure: IR WITH ANESTHESIA;  Surgeon: Julieanne Cotton, MD;  Location: [facility] OR;  Service: Radiology;  Laterality: N/A;   ROTATOR CUFF REPAIR     Patient Active Problem List   Diagnosis Date Noted   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 male with PMH: HTN, CAD, HLD, osteoarthritis, SCC, sleep apnea. He presented to the hospital on 05/20/22 with right sided weakness and aphasia with LKW reported by wife to be 10pm on 10/2.  He arrived at [facility]  as code stroke and taken for emergent CT/CTA which demonstrated left M1 occlusion. He received IV tenecteplase and IR thrombectomy due to large vessel occlusion.       DIAGNOSTIC FINDINGS:  MRI 05/22/2022 Cortical ischemia throughout the left MCA territory compatible with recent infarct. Relatively little affect on the white matter. 2. Small focus of acute hemorrhage along the anterior surface of the left temporal lobe Heidelberg classification 3c: Subarachnoid hemorrhage. 3. Normal intracranial MRA.  Restored flow in left MCA.    THERAPY DIAG:  Aphasia  Rationale for Evaluation and Treatment Rehabilitation  SUBJECTIVE:   PAIN:   Are you having pain? No  PATIENT GOALS: to be able to communicate again  SUBJECTIVE STATEMENT: Pt arrives with homework completed Pt accompanied by: self and significant other  OBJECTIVE:   TODAY'S TREATMENT: Skilled treatment session focused on pt's communication goals. SLP facilitated session by providing the following interventions:  While pt arrives with his homework completed but his wife reporting "he really struggled, I don't think he sees a point in doing it (referring to semantic feature analysis)."   Multiple functional examples were provided for practice in expanding written information to improve communication. Pt continues to be reliant on cues for description. He continued stating, "I know" but while pt's "knows" he lacks the ability to communicate what he knows. This continues to be a difficult concept for pt.   PATIENT EDUCATION: Education details: see above Person educated: Patient and Wife Education method: Explanation, Facilities manager, Verbal cues, and Handouts Education comprehension: verbalized understanding  HOME EXERCISE PROGRAM:  Practice basic semantic feature analysis  GOALS: Goals reviewed with patient? Yes  SHORT TERM GOALS: Target date: 10 sessions  UPDATED: 01/20/2023   UPDATED: 02/12/2023 3.   Pt will improve wording finding abilities by writing 3 or more words during communication exchanges to increase listener understanding with moderate A in 5 out of 7 opportunities; ONGOING; progress made 5. Pt will improve spelling ability by accurately spelling SEMI-COMPLEX words during communication exchanges to increase listener understanding with min A in 5 out of 7 opportunities; INITIAL; progress made 6.  Pt will read self-generated words during communication exchanges with > 50% speech intelligibility given Min A articulatory cues; INITIAL; progress made   LONG TERM GOALS: Target date: 04/14/2023    UPDATED 01/20/2023  Pt will use multimodal  communication to express basic wants/needs in 4 of 10 opportunities.  Baseline: unable Goal status: ONGOING; ONGOING    2.  Pt will demonstrate increased reading comprehension by selecting accurate answer from 3 choices in 8 out of 10 opportunities.  Goal status: ONGOING; MET (01/20/2023)  ASSESSMENT:  CLINICAL IMPRESSION: Pt is a 77 year old male who was seen today for therapy targeting his aphasia. Pt continues to struggle with understanding need for semantic feature analysis. Pt continues to show limited awareness of inability to communication as he continues looking to his wife "well she knows." See treatment note for details.   OBJECTIVE IMPAIRMENTS include expressive language, receptive language, aphasia, and apraxia. These impairments are limiting patient from managing medications, managing appointments, managing finances, household responsibilities, ADLs/IADLs, and effectively communicating at home and in community. Factors affecting potential to achieve goals and functional outcome are severity of impairments. Patient will benefit from skilled SLP services to address above impairments and improve overall function.  REHAB POTENTIAL: Excellent  PLAN: SLP FREQUENCY: 3x/week  SLP DURATION: 12 weeks  PLANNED INTERVENTIONS: Language facilitation, Environmental controls, Internal/external aids, Functional tasks, Multimodal communication approach, SLP instruction and feedback,  Compensatory strategies, and Patient/family education    Daviel Allegretto B. Dreama Saa, M.S., CCC-SLP, Tree surgeon Certified Brain Injury Specialist [facility]  [facility] Rehabilitation Services Office [phone] Ascom [phone] Fax [phone]

## 2023-03-07 ENCOUNTER — Ambulatory Visit: Payer: Medicare HMO | Admitting: Speech Pathology

## 2023-03-07 DIAGNOSIS — I6602 Occlusion and stenosis of left middle cerebral artery: Secondary | ICD-10-CM

## 2023-03-07 DIAGNOSIS — R482 Apraxia: Secondary | ICD-10-CM

## 2023-03-07 DIAGNOSIS — R4701 Aphasia: Secondary | ICD-10-CM | POA: Diagnosis not present

## 2023-03-07 NOTE — Therapy (Signed)
OUTPATIENT SPEECH LANGUAGE PATHOLOGY TREATMENT NOTE    Patient Name: Alan Stewart MRN: [id number] DOB:[birth year], 78 y.o., male Today's Date: 03/07/2023   PCP: Daniel Nones, MD REFERRING PROVIDER: Daniel Nones, MD   END OF SESSION  End of Session - 03/07/23 1016     Visit Number 98    Number of Visits 117    Date for SLP Re-Evaluation 04/14/23    Authorization Type [employer] H[employer]    Authorization Time Period 11/22/2022 thru 03/21/2023    Authorization - Visit Number 36    Authorization - Number of Visits 44    Progress Note Due on Visit 100    SLP Start Time 1015    SLP Stop Time  1100    SLP Time Calculation (min) 45 min    Activity Tolerance Patient tolerated treatment well              Past Medical History:  Diagnosis Date   CAD (coronary artery disease)    a. 10/2021 NSTEMI/PCI: LM nl, LAD min irregs, LCX 83m (2.5x18 Onyx Frontier DES), OM1 nl, RCA 69m (3.5x26 Onyx Frontier DES), 30d.   Diastolic dysfunction    a. 10/2021 Echo: EF 50-55%, no rwma, Gr1 DD, nl RV fxn. No significant valvular dzs.   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 SCCis. MOHs 07/08/22   Past Surgical History:  Procedure Laterality Date   COLONOSCOPY N/A 10/09/2015   Procedure: COLONOSCOPY;  Surgeon: Wallace Cullens, MD;  Location: [facility] ENDOSCOPY;  Service: Gastroenterology;  Laterality: N/A;   CORONARY STENT INTERVENTION N/A 10/22/2021   Procedure: CORONARY STENT INTERVENTION;  Surgeon: Iran Ouch, MD;  Location: [facility] INVASIVE CV LAB;  Service: Cardiovascular;  Laterality: N/A;   IR CT HEAD LTD  05/21/2022   IR PERCUTANEOUS ART THROMBECTOMY/INFUSION INTRACRANIAL INC DIAG ANGIO  05/21/2022   LEFT HEART CATH AND CORONARY ANGIOGRAPHY N/A 10/22/2021   Procedure: LEFT HEART CATH AND  CORONARY ANGIOGRAPHY;  Surgeon: Iran Ouch, MD;  Location: [facility] INVASIVE CV LAB;  Service: Cardiovascular;  Laterality: N/A;   LOOP RECORDER INSERTION N/A 05/23/2022   Procedure: LOOP RECORDER INSERTION;  Surgeon: Lanier Prude, MD;  Location: [facility] INVASIVE CV LAB;  Service: Cardiovascular;  Laterality: N/A;   RADIOLOGY WITH ANESTHESIA N/A 05/20/2022   Procedure: IR WITH ANESTHESIA;  Surgeon: Julieanne Cotton, MD;  Location: [facility] OR;  Service: Radiology;  Laterality: N/A;   ROTATOR CUFF REPAIR     Patient Active Problem List   Diagnosis Date Noted   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 male with PMH: HTN, CAD, HLD, osteoarthritis, SCC, sleep apnea. He presented to the hospital on 05/20/22 with right sided weakness and aphasia with LKW reported by wife to be 10pm on 10/2.  He arrived at [facility]  as code stroke and taken for emergent CT/CTA which demonstrated left M1 occlusion. He received IV tenecteplase and IR thrombectomy due to large vessel occlusion.       DIAGNOSTIC FINDINGS:  MRI 05/22/2022 Cortical ischemia throughout the left MCA territory compatible with recent infarct. Relatively little affect on the white matter. 2. Small focus of acute hemorrhage along the anterior surface of the left temporal lobe Heidelberg classification 3c: Subarachnoid hemorrhage. 3. Normal intracranial MRA.  Restored flow in left MCA.    THERAPY DIAG:  Aphasia  Apraxia  Middle cerebral artery embolism, left  Rationale for Evaluation and  Treatment Rehabilitation  SUBJECTIVE:   PAIN:  Are you having pain? No  PATIENT GOALS: to be able to communicate again  SUBJECTIVE STATEMENT: Pt and his wife describe family incident  Pt accompanied by: self and significant other  OBJECTIVE:   TODAY'S TREATMENT: Skilled treatment session focused on pt's communication goals. SLP facilitated session by providing the following interventions:   SLP provided introduction to TEXT to [employer] app on clinic iPad. With prompting pt able to use effectively with cues to check word prediction to increase spelling accuracy. With prompts, pt able to play information and then with further prompts pt able to say word with increased speech intelligibility than when using writing to communicate.   PATIENT EDUCATION: Education details: see above Person educated: Patient and Wife Education method: Explanation, Facilities manager, Verbal cues, and Handouts Education comprehension: verbalized understanding  HOME EXERCISE PROGRAM:  Practice basic semantic feature analysis  GOALS: Goals reviewed with patient? Yes  SHORT TERM GOALS: Target date: 10 sessions  UPDATED: 01/20/2023   UPDATED: 02/12/2023 3.   Pt will improve wording finding abilities by writing 3 or more words during communication exchanges to increase listener understanding with moderate A in 5 out of 7 opportunities; ONGOING; progress made 5. Pt will improve spelling ability by accurately spelling SEMI-COMPLEX words during communication exchanges to increase listener understanding with min A in 5 out of 7 opportunities; INITIAL; progress made 6.  Pt will read self-generated words during communication exchanges with > 50% speech intelligibility given Min A articulatory cues; INITIAL; progress made   LONG TERM GOALS: Target date: 04/14/2023    UPDATED 01/20/2023  Pt will use multimodal communication to express basic wants/needs in 4 of 10 opportunities.  Baseline: unable Goal status:  ONGOING; ONGOING    2.  Pt will demonstrate increased reading comprehension by selecting accurate answer from 3 choices in 8 out of 10 opportunities.  Goal status: ONGOING; MET (01/20/2023)  ASSESSMENT:  CLINICAL IMPRESSION: Pt is a 77 year old male who was seen today for therapy targeting his aphasia. Pt with improved communication using text to speech app. See treatment note for details.   OBJECTIVE IMPAIRMENTS include expressive language, receptive language, aphasia, and apraxia. These impairments are limiting patient from managing medications, managing appointments, managing finances, household responsibilities, ADLs/IADLs, and effectively communicating at home and in community. Factors affecting potential to achieve goals and functional outcome are severity of impairments. Patient will benefit from skilled SLP services to address above impairments and improve overall function.  REHAB POTENTIAL: Excellent  PLAN: SLP FREQUENCY: 3x/week  SLP DURATION: 12 weeks  PLANNED INTERVENTIONS: Language facilitation, Environmental controls, Internal/external aids, Functional tasks, Multimodal communication approach, SLP instruction and feedback, Compensatory strategies, and Patient/family education    Khalik Pewitt B. Dreama Saa, M.S., CCC-SLP, Tree surgeon Certified Brain Injury Specialist [facility]  [facility] Rehabilitation Services Office [phone] Ascom [phone] Fax [phone]

## 2023-03-10 ENCOUNTER — Ambulatory Visit: Payer: Medicare HMO | Admitting: Speech Pathology

## 2023-03-10 ENCOUNTER — Telehealth: Payer: Self-pay

## 2023-03-10 DIAGNOSIS — I6602 Occlusion and stenosis of left middle cerebral artery: Secondary | ICD-10-CM

## 2023-03-10 DIAGNOSIS — R4701 Aphasia: Secondary | ICD-10-CM

## 2023-03-10 DIAGNOSIS — R482 Apraxia: Secondary | ICD-10-CM | POA: Diagnosis not present

## 2023-03-10 NOTE — Telephone Encounter (Signed)
Alert received in [employer] for potential AF detection.  Strip reviewed by this nurse and with Pavan from [employer].  Pt has a history of small P waves on his device.  Unsure if this is a true afib episode.  Will forward to Dr. Lalla Brothers to review.

## 2023-03-10 NOTE — Therapy (Signed)
OUTPATIENT SPEECH LANGUAGE PATHOLOGY TREATMENT NOTE    Patient Name: Alan Stewart MRN: [id number] DOB:05-12-1946, 77 y.o., male Today's Date: 03/10/2023   PCP: Daniel Nones, MD REFERRING PROVIDER: Daniel Nones, MD   END OF SESSION  End of Session - 03/10/23 1059     Visit Number 99    Number of Visits 117    Date for SLP Re-Evaluation 04/14/23    Authorization Type [employer]    Authorization Time Period 11/22/2022 thru 03/21/2023    Authorization - Visit Number [id number]    Authorization - Number of Visits 44    Progress Note Due on Visit 100    SLP Start Time 1015    SLP Stop Time  1100    SLP Time Calculation (min) 45 min    Activity Tolerance Patient tolerated treatment well              Past Medical History:  Diagnosis Date   CAD (coronary artery disease)    a. 10/2021 NSTEMI/PCI: LM nl, LAD min irregs, LCX 65m (2.5x18 Onyx Frontier DES), OM1 nl, RCA 28m (3.5x26 Onyx Frontier DES), 30d.   Diastolic dysfunction    a. 10/2021 Echo: EF 50-55%, no rwma, Gr1 DD, nl RV fxn. No significant valvular dzs.   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 SCCis. MOHs 07/08/22   Past Surgical History:  Procedure Laterality Date   COLONOSCOPY N/A 10/09/2015   Procedure: COLONOSCOPY;  Surgeon: Wallace Cullens, MD;  Location: [facility] ENDOSCOPY;  Service: Gastroenterology;  Laterality: N/A;   CORONARY STENT INTERVENTION N/A 10/22/2021   Procedure: CORONARY STENT INTERVENTION;  Surgeon: Iran Ouch, MD;  Location: [facility] INVASIVE CV LAB;  Service: Cardiovascular;  Laterality: N/A;   IR CT HEAD LTD  05/21/2022   IR PERCUTANEOUS ART THROMBECTOMY/INFUSION INTRACRANIAL INC DIAG ANGIO  05/21/2022   LEFT HEART CATH AND CORONARY ANGIOGRAPHY N/A 10/22/2021   Procedure: LEFT HEART CATH AND  CORONARY ANGIOGRAPHY;  Surgeon: Iran Ouch, MD;  Location: [facility] INVASIVE CV LAB;  Service: Cardiovascular;  Laterality: N/A;   LOOP RECORDER INSERTION N/A 05/23/2022   Procedure: LOOP RECORDER INSERTION;  Surgeon: Lanier Prude, MD;  Location: [facility] INVASIVE CV LAB;  Service: Cardiovascular;  Laterality: N/A;   RADIOLOGY WITH ANESTHESIA N/A 05/20/2022   Procedure: IR WITH ANESTHESIA;  Surgeon: Julieanne Cotton, MD;  Location: [facility] OR;  Service: Radiology;  Laterality: N/A;   ROTATOR CUFF REPAIR     Patient Active Problem List   Diagnosis Date Noted   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 male with PMH: HTN, CAD, HLD, osteoarthritis, SCC, sleep apnea. He presented to the hospital on 05/20/22 with right sided weakness and aphasia with LKW reported by wife to be 10pm on 10/2.  He arrived at [facility]  as code stroke and taken for emergent CT/CTA which demonstrated left M1 occlusion. He received IV tenecteplase and IR thrombectomy due to large vessel occlusion.       DIAGNOSTIC FINDINGS:  MRI 05/22/2022 Cortical ischemia throughout the left MCA territory compatible with recent infarct. Relatively little affect on the white matter. 2. Small focus of acute hemorrhage along the anterior surface of the left temporal lobe Heidelberg classification 3c: Subarachnoid hemorrhage. 3. Normal intracranial MRA.  Restored flow in left MCA.    THERAPY DIAG:  Aphasia  Apraxia  Middle cerebral artery embolism, left  Rationale for Evaluation and  Treatment Rehabilitation  SUBJECTIVE:   PAIN:  Are you having pain? No  PATIENT GOALS: to be able to communicate again  SUBJECTIVE STATEMENT: "He wasn't able to use that at all" referring to the Text to Speech App Pt accompanied by: self and significant other  OBJECTIVE:   TODAY'S TREATMENT: Skilled treatment session focused on pt's communication goals. SLP facilitated session by providing the following interventions:   Pt demonstration confused regarding previously typed words from Friday's session. Moderate assistance provided for use of the different features of the app faded to minimal A for features. Continued moderate assistance required for pt to play typed worded and to verbally imitate word to improve decrease apraxia of speech. Pt with remarkably improved speech intelligibility despite reduced vocal intensity d/t lack of confidence in verbal expression. Continued encouragement provided to bolster confidence.   PATIENT EDUCATION: Education details: see above Person educated: Patient and Wife Education method: Explanation, Facilities manager, Verbal cues, and Handouts Education comprehension: verbalized understanding  HOME EXERCISE PROGRAM:  Practice basic semantic feature analysis  GOALS: Goals reviewed with patient? Yes  SHORT TERM GOALS: Target date: 10 sessions  UPDATED: 01/20/2023   UPDATED: 02/12/2023 3.   Pt will improve wording finding abilities by writing 3 or more words during communication exchanges to increase listener understanding with moderate A in 5 out of 7 opportunities; ONGOING; progress made 5. Pt will improve spelling ability by accurately spelling SEMI-COMPLEX words during communication exchanges to increase listener understanding with min A in 5 out of 7 opportunities; INITIAL; progress made 6.  Pt will read self-generated words during communication exchanges with > 50% speech intelligibility given Min A articulatory cues; INITIAL; progress  made   LONG TERM GOALS: Target date: 04/14/2023    UPDATED 01/20/2023  Pt will use multimodal communication to express basic wants/needs in 4 of 10 opportunities.  Baseline: unable Goal status: ONGOING; ONGOING    2.  Pt will demonstrate increased reading comprehension by selecting accurate answer from 3 choices in 8 out of 10 opportunities.  Goal status: ONGOING; MET (01/20/2023)  ASSESSMENT:  CLINICAL IMPRESSION: Pt is a 77 year old male who was seen today for therapy targeting his aphasia. Pt with improved communication using text to speech app. Pt benefits from encouragement to use spoken language as he struggles with confidence when speaking d/t verbal errors. See treatment note for details.   OBJECTIVE IMPAIRMENTS include expressive language, receptive language, aphasia, and apraxia. These impairments are limiting patient from managing medications, managing appointments, managing finances, household responsibilities, ADLs/IADLs, and effectively communicating at home and in community. Factors affecting potential to achieve goals and functional outcome are severity of impairments. Patient will benefit from skilled SLP services to address above impairments and improve overall function.  REHAB POTENTIAL: Excellent  PLAN: SLP FREQUENCY: 3x/week  SLP DURATION: 12 weeks  PLANNED INTERVENTIONS: Language facilitation, Environmental controls, Internal/external aids, Functional tasks, Multimodal  communication approach, SLP instruction and feedback, Compensatory strategies, and Patient/family education    Alphia Behanna B. Dreama Saa, M.S., CCC-SLP, Tree surgeon Certified Brain Injury Specialist [facility]  [facility] Rehabilitation Services Office [phone] Ascom [phone] Fax [phone]

## 2023-03-11 NOTE — Telephone Encounter (Signed)
Received notification in [employer] again today for new onset AF episode (same EGM/episodes 03/08/23 as provided in note below).  Reviewed with Dr. Ladona Ridgel.  He is suspicious that this could be true AF and noise occurring; however, as only 8 minutes, even with cryptogenic stroke history, he would like to defer to Dr. Lalla Brothers and continue to monitor.

## 2023-03-12 ENCOUNTER — Ambulatory Visit: Payer: Medicare HMO | Admitting: Speech Pathology

## 2023-03-12 DIAGNOSIS — R4701 Aphasia: Secondary | ICD-10-CM | POA: Diagnosis not present

## 2023-03-12 DIAGNOSIS — I6602 Occlusion and stenosis of left middle cerebral artery: Secondary | ICD-10-CM

## 2023-03-12 DIAGNOSIS — R482 Apraxia: Secondary | ICD-10-CM | POA: Diagnosis not present

## 2023-03-12 NOTE — Therapy (Signed)
OUTPATIENT SPEECH LANGUAGE PATHOLOGY TREATMENT NOTE 10th treatment progress note    Patient Name: Alan Stewart MRN: [id number] DOB:1945/11/03, 77 y.o., male Today's Date: 03/12/2023   PCP: Daniel Nones, MD REFERRING PROVIDER: Daniel Nones, MD   END OF SESSION  End of Session - 03/12/23 1035     Visit Number 100    Number of Visits 117    Date for SLP Re-Evaluation 04/14/23    Authorization Type [employer] H[employer]    Authorization Time Period 11/22/2022 thru 03/21/2023    Authorization - Visit Number [id number]    Authorization - Number of Visits 44    Progress Note Due on Visit 100    SLP Start Time 1015    SLP Stop Time  1100    SLP Time Calculation (min) 45 min    Activity Tolerance Patient tolerated treatment well              Past Medical History:  Diagnosis Date   CAD (coronary artery disease)    a. 10/2021 NSTEMI/PCI: LM nl, LAD min irregs, LCX 44m (2.5x18 Onyx Frontier DES), OM1 nl, RCA 15m (3.5x26 Onyx Frontier DES), 30d.   Diastolic dysfunction    a. 10/2021 Echo: EF 50-55%, no rwma, Gr1 DD, nl RV fxn. No significant valvular dzs.   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 SCCis. MOHs 07/08/22   Past Surgical History:  Procedure Laterality Date   COLONOSCOPY N/A 10/09/2015   Procedure: COLONOSCOPY;  Surgeon: Wallace Cullens, MD;  Location: [facility] ENDOSCOPY;  Service: Gastroenterology;  Laterality: N/A;   CORONARY STENT INTERVENTION N/A 10/22/2021   Procedure: CORONARY STENT INTERVENTION;  Surgeon: Iran Ouch, MD;  Location: [facility] INVASIVE CV LAB;  Service: Cardiovascular;  Laterality: N/A;   IR CT HEAD LTD  05/21/2022   IR PERCUTANEOUS ART THROMBECTOMY/INFUSION INTRACRANIAL INC DIAG ANGIO  05/21/2022   LEFT HEART CATH AND CORONARY ANGIOGRAPHY N/A 10/22/2021    Procedure: LEFT HEART CATH AND CORONARY ANGIOGRAPHY;  Surgeon: Iran Ouch, MD;  Location: [facility] INVASIVE CV LAB;  Service: Cardiovascular;  Laterality: N/A;   LOOP RECORDER INSERTION N/A 05/23/2022   Procedure: LOOP RECORDER INSERTION;  Surgeon: Lanier Prude, MD;  Location: [facility] INVASIVE CV LAB;  Service: Cardiovascular;  Laterality: N/A;   RADIOLOGY WITH ANESTHESIA N/A 05/20/2022   Procedure: IR WITH ANESTHESIA;  Surgeon: Julieanne Cotton, MD;  Location: [facility] OR;  Service: Radiology;  Laterality: N/A;   ROTATOR CUFF REPAIR     Patient Active Problem List   Diagnosis Date Noted   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 male with PMH: HTN, CAD, HLD, osteoarthritis, SCC, sleep apnea. He presented to the hospital on 05/20/22 with right sided weakness and aphasia with LKW reported by wife to be 10pm on 10/2.  He  arrived at [facility] as code stroke and taken for emergent CT/CTA which demonstrated left M1 occlusion. He received IV tenecteplase and IR thrombectomy due to large vessel occlusion.       DIAGNOSTIC FINDINGS:  MRI 05/22/2022 Cortical ischemia throughout the left MCA territory compatible with recent infarct. Relatively little affect on the white matter. 2. Small focus of acute hemorrhage along the anterior surface of the left temporal lobe Heidelberg classification 3c: Subarachnoid hemorrhage. 3. Normal intracranial MRA.  Restored flow in left MCA.    THERAPY DIAG:  Aphasia  Apraxia  Middle cerebral artery embolism,  left  Rationale for Evaluation and Treatment Rehabilitation  SUBJECTIVE:   PAIN:  Are you having pain? No  PATIENT GOALS: to be able to communicate again  SUBJECTIVE STATEMENT: Pt gestured that he didn't understand his lock screen Pt accompanied by: self and significant other  OBJECTIVE:   TODAY'S TREATMENT: Skilled treatment session focused on pt's communication goals. SLP facilitated session by providing the following interventions:   Pt arrived to session and took his phone out at the initial part of the session. He gestured to phone but was not able to communicate what he was curious about. During the previous session, SLP had increased the time on his phone before the lock screen would occur (with pt's permission). It had been set to 30 seconds which was impeding use of Text to Speech app. While pt appeared to understand what this writer had done by increasing the time, it was apparent today that he didn't understand. Pt's wife's phone was used to provide visual support of concept. Pt voiced understanding with this support.   Pt's wife reports some wonderful events - 1.) pt called her while she was at work (usually don't attempt to communicate via phone), she reports that he used mostly verbal letters so then 2.) he texted her "cre[employer] and [employer]" which was the first time that he attempted texting her since his CVA (05/2022)  SLP further facilitated session by providing categorization task to target word finding, cognitive use of Text to Speech App and speech intelligibility.  Word finding: given basic functional category - with Min A cues, pt able to list 10 items in category - specifically, pt benefited from cues to demonstrate flexible thinking in generating another items vs the item that he was stuck on  Use of app: Mod A faded to Min A coupled with wait time - with this level of assistance, pt able to use app effectively to communicate Speech approximation/intelligibility: pt with  improved speech intelligibility following model provided by app for 16 out of 20 words; pt able to improve speech intelligibility with moderate verbal cues.   PATIENT EDUCATION: Education details: see above Person educated: Patient and Wife Education method: Programmer, multimedia, Facilities manager, Verbal cues, and Handouts Education comprehension: verbalized understanding  HOME EXERCISE PROGRAM: Using pt's word search puzzles, enter the words into Text to Speech App and then repeat them to his wife - to improve verbal interaction during the evenings GOALS: Goals reviewed with patient? Yes  SHORT TERM GOALS: Target date: 10 sessions  UPDATED: 01/20/2023   UPDATED: 02/12/2023 3.   Pt will improve wording finding abilities by writing 3 or more words during communication exchanges to increase listener understanding with moderate A in 5 out of 7 opportunities; ONGOING; progress made 5. Pt will improve spelling ability by accurately spelling SEMI-COMPLEX words during communication exchanges to increase listener understanding with min A in 5 out of 7 opportunities; INITIAL; progress  made 6.  Pt will read self-generated words during communication exchanges with > 50% speech intelligibility given Min A articulatory cues; INITIAL; progress made   LONG TERM GOALS: Target date: 04/14/2023    UPDATED 01/20/2023  Pt will use multimodal communication to express basic wants/needs in 4 of 10 opportunities.  Baseline: unable Goal status: ONGOING; ONGOING    2.  Pt will demonstrate increased reading comprehension by selecting accurate answer from 3 choices in 8 out of 10 opportunities.  Goal status: ONGOING; MET (01/20/2023)  ASSESSMENT:  CLINICAL IMPRESSION: Pt is a 77 year old male who was seen today for therapy targeting his aphasia. Pt with improved communication using text to speech app. His wife continues to express a desire for pt to talk more in the evening (pt was quiet at baseline). In an effort to  increase conversation (or interaction) recommend pt use his word searches to talk about the words contained therein.   OBJECTIVE IMPAIRMENTS include expressive language, receptive language, aphasia, and apraxia. These impairments are limiting patient from managing medications, managing appointments, managing finances, household responsibilities, ADLs/IADLs, and effectively communicating at home and in community. Factors affecting potential to achieve goals and functional outcome are severity of impairments. Patient will benefit from skilled SLP services to address above impairments and improve overall function.  REHAB POTENTIAL: Excellent  PLAN: SLP FREQUENCY: 3x/week  SLP DURATION: 12 weeks  PLANNED INTERVENTIONS: Language facilitation, Environmental controls, Internal/external aids, Functional tasks, Multimodal communication approach, SLP instruction and feedback, Compensatory strategies, and Patient/family education    Solenne Manwarren B. Dreama Saa, M.S., CCC-SLP, Tree surgeon Certified Brain Injury Specialist [facility]  [facility] Rehabilitation Services Office [phone] Ascom [phone] Fax [phone]

## 2023-03-14 ENCOUNTER — Ambulatory Visit: Payer: Medicare HMO | Admitting: Speech Pathology

## 2023-03-17 ENCOUNTER — Ambulatory Visit: Payer: Medicare HMO | Admitting: Speech Pathology

## 2023-03-17 DIAGNOSIS — R4701 Aphasia: Secondary | ICD-10-CM | POA: Diagnosis not present

## 2023-03-17 DIAGNOSIS — R482 Apraxia: Secondary | ICD-10-CM | POA: Diagnosis not present

## 2023-03-17 DIAGNOSIS — I6602 Occlusion and stenosis of left middle cerebral artery: Secondary | ICD-10-CM

## 2023-03-17 NOTE — Therapy (Signed)
OUTPATIENT SPEECH LANGUAGE PATHOLOGY TREATMENT NOTE    Patient Name: Alan Stewart MRN: [id number] DOB:Jan 07, 1946, 77 y.o., male Today's Date: 03/17/2023   PCP: Daniel Nones, MD REFERRING PROVIDER: Daniel Nones, MD   END OF SESSION  End of Session - 03/17/23 1029     Visit Number 101    Number of Visits 117    Date for SLP Re-Evaluation 04/14/23    Authorization Type [employer] H[employer]    Authorization Time Period 11/22/2022 thru 03/21/2023    Authorization - Visit Number 39    Authorization - Number of Visits 44    Progress Note Due on Visit 110    SLP Start Time 1010    SLP Stop Time  1100    SLP Time Calculation (min) 50 min    Activity Tolerance Patient tolerated treatment well              Past Medical History:  Diagnosis Date   CAD (coronary artery disease)    a. 10/2021 NSTEMI/PCI: LM nl, LAD min irregs, LCX 3m (2.5x18 Onyx Frontier DES), OM1 nl, RCA 18m (3.5x26 Onyx Frontier DES), 30d.   Diastolic dysfunction    a. 10/2021 Echo: EF 50-55%, no rwma, Gr1 DD, nl RV fxn. No significant valvular dzs.   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 SCCis. MOHs 07/08/22   Past Surgical History:  Procedure Laterality Date   COLONOSCOPY N/A 10/09/2015   Procedure: COLONOSCOPY;  Surgeon: Wallace Cullens, MD;  Location: [facility] ENDOSCOPY;  Service: Gastroenterology;  Laterality: N/A;   CORONARY STENT INTERVENTION N/A 10/22/2021   Procedure: CORONARY STENT INTERVENTION;  Surgeon: Iran Ouch, MD;  Location: [facility] INVASIVE CV LAB;  Service: Cardiovascular;  Laterality: N/A;   IR CT HEAD LTD  05/21/2022   IR PERCUTANEOUS ART THROMBECTOMY/INFUSION INTRACRANIAL INC DIAG ANGIO  05/21/2022   LEFT HEART CATH AND CORONARY ANGIOGRAPHY N/A 10/22/2021   Procedure: LEFT HEART CATH  AND CORONARY ANGIOGRAPHY;  Surgeon: Iran Ouch, MD;  Location: [facility] INVASIVE CV LAB;  Service: Cardiovascular;  Laterality: N/A;   LOOP RECORDER INSERTION N/A 05/23/2022   Procedure: LOOP RECORDER INSERTION;  Surgeon: Lanier Prude, MD;  Location: [facility] INVASIVE CV LAB;  Service: Cardiovascular;  Laterality: N/A;   RADIOLOGY WITH ANESTHESIA N/A 05/20/2022   Procedure: IR WITH ANESTHESIA;  Surgeon: Julieanne Cotton, MD;  Location: [facility] OR;  Service: Radiology;  Laterality: N/A;   ROTATOR CUFF REPAIR     Patient Active Problem List   Diagnosis Date Noted   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 male with PMH: HTN, CAD, HLD, osteoarthritis, SCC, sleep apnea. He presented to the hospital on 05/20/22 with right sided weakness and aphasia with LKW reported by wife to be 10pm on 10/2.  He arrived at [facility]  as code stroke and taken for emergent CT/CTA which demonstrated left M1 occlusion. He received IV tenecteplase and IR thrombectomy due to large vessel occlusion.       DIAGNOSTIC FINDINGS:  MRI 05/22/2022 Cortical ischemia throughout the left MCA territory compatible with recent infarct. Relatively little affect on the white matter. 2. Small focus of acute hemorrhage along the anterior surface of the left temporal lobe Heidelberg classification 3c: Subarachnoid hemorrhage. 3. Normal intracranial MRA.  Restored flow in left MCA.    THERAPY DIAG:  Aphasia  Apraxia  Middle cerebral artery embolism, left  Rationale for Evaluation  and Treatment Rehabilitation  SUBJECTIVE:   PAIN:  Are you having pain? No  PATIENT GOALS: to be able to communicate again  SUBJECTIVE STATEMENT: Pt gestured that he didn't understand his lock screen Pt accompanied by: self and significant other  OBJECTIVE:   TODAY'S TREATMENT: Skilled treatment session focused on pt's communication goals. SLP facilitated session by providing the following interventions:   Pt's wife states that she has been working with pt on saying high frequency words to help pt in formulating sentence length utterances. She also reports that he independently took his phone out to use the app while waiting for therapy session and during their trip over the weekend.   To promote increased use of app and increased verbalizations, SLP asked pt about people and events from this past weekend. He demonstrated decreased mental flexibility as he was not able to spell his grandson's name but needed maximal prompting to locate it in another location of his phone (this is typically something he is independent with) appeared unable to locate his grandson's name.   SLP utilized [facility] 8: Word Finding Building Categorization Skills p33 - pt instructed to provide 3 items - pt with inability d/t becoming "stuck" on the word that he was attempting to type/write. For example when asked to write down "things you might find in a woman's purse" pt wrote down "billboard." While he knew this was incorrect, he was not able to think of items other than a "billfold" such as money, pictures, credit cards, gum. He was also observed having difficulty with higher level language concepts. When tasked with stating "things people pound" he replied "coffee" with gesture indicating they purchase a pound of coffee. Pt unable to use as a verb and state items such as meat, nails, table, fists. Uncertain if decreased mental flexibility is related to memory, word finding or cognition. Will continue to assess in  effort to improve pt's abundance of words to communicate with.    PATIENT EDUCATION: Education details: see above Person educated: Patient and Wife Education method: Programmer, multimedia, Facilities manager, Verbal cues, and Handouts Education comprehension: verbalized understanding  HOME EXERCISE PROGRAM: Using pt's word search puzzles, enter the words into Text to Speech App and then repeat them to his wife - to improve verbal interaction during the evenings GOALS: Goals reviewed with patient? Yes  SHORT TERM GOALS: Target date: 10 sessions  UPDATED: 01/20/2023   UPDATED: 02/12/2023 3.   Pt will improve wording finding abilities by writing 3 or more words during communication exchanges to increase listener understanding with moderate A in 5 out of 7 opportunities; ONGOING; progress made 5. Pt will improve spelling ability by accurately spelling SEMI-COMPLEX words during communication exchanges to increase listener understanding with min A in 5 out of 7 opportunities; INITIAL; progress made 6.  Pt will read self-generated words during communication exchanges with > 50% speech intelligibility given Min A articulatory  cues; INITIAL; progress made   LONG TERM GOALS: Target date: 04/14/2023    UPDATED 01/20/2023  Pt will use multimodal communication to express basic wants/needs in 4 of 10 opportunities.  Baseline: unable Goal status: ONGOING; ONGOING    2.  Pt will demonstrate increased reading comprehension by selecting accurate answer from 3 choices in 8 out of 10 opportunities.  Goal status: ONGOING; MET (01/20/2023)  ASSESSMENT:  CLINICAL IMPRESSION: Pt is a 77 year old male who was seen today for therapy targeting his aphasia. Pt with improved communication using text to speech app. See the above treatment note for details.   OBJECTIVE IMPAIRMENTS include expressive language, receptive language, aphasia, and apraxia. These impairments are limiting patient from managing medications, managing  appointments, managing finances, household responsibilities, ADLs/IADLs, and effectively communicating at home and in community. Factors affecting potential to achieve goals and functional outcome are severity of impairments. Patient will benefit from skilled SLP services to address above impairments and improve overall function.  REHAB POTENTIAL: Excellent  PLAN: SLP FREQUENCY: 3x/week  SLP DURATION: 12 weeks  PLANNED INTERVENTIONS: Language facilitation, Environmental controls, Internal/external aids, Functional tasks, Multimodal communication approach, SLP instruction and feedback, Compensatory strategies, and Patient/family education    Jissell Trafton B. Dreama Saa, M.S., CCC-SLP, Tree surgeon Certified Brain Injury Specialist [facility]  [facility] Rehabilitation Services Office [phone] Ascom [phone] Fax [phone]

## 2023-03-18 NOTE — Progress Notes (Signed)
[facility] Summary Report / Loop Recorder 

## 2023-03-19 ENCOUNTER — Ambulatory Visit: Payer: Medicare HMO | Admitting: Speech Pathology

## 2023-03-19 DIAGNOSIS — R482 Apraxia: Secondary | ICD-10-CM | POA: Diagnosis not present

## 2023-03-19 DIAGNOSIS — R4701 Aphasia: Secondary | ICD-10-CM

## 2023-03-19 DIAGNOSIS — I6602 Occlusion and stenosis of left middle cerebral artery: Secondary | ICD-10-CM | POA: Diagnosis not present

## 2023-03-19 NOTE — Therapy (Signed)
OUTPATIENT SPEECH LANGUAGE PATHOLOGY TREATMENT NOTE    Patient Name: Alan Stewart MRN: [id number] DOB:03/10/1946, 77 y.o., male Today's Date: 03/19/2023   PCP: Daniel Nones, MD REFERRING PROVIDER: Daniel Nones, MD   END OF SESSION  End of Session - 03/19/23 1019     Visit Number 102    Number of Visits 117    Date for SLP Re-Evaluation 04/14/23    Authorization Type [employer] H[employer]    Authorization Time Period 11/22/2022 thru 03/21/2023    Authorization - Visit Number 40    Authorization - Number of Visits 44    Progress Note Due on Visit 110    SLP Start Time 1015    SLP Stop Time  1100    SLP Time Calculation (min) 45 min    Activity Tolerance Patient tolerated treatment well              Past Medical History:  Diagnosis Date   CAD (coronary artery disease)    a. 10/2021 NSTEMI/PCI: LM nl, LAD min irregs, LCX 57m (2.5x18 Onyx Frontier DES), OM1 nl, RCA 49m (3.5x26 Onyx Frontier DES), 30d.   Diastolic dysfunction    a. 10/2021 Echo: EF 50-55%, no rwma, Gr1 DD, nl RV fxn. No significant valvular dzs.   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 SCCis. MOHs 07/08/22   Past Surgical History:  Procedure Laterality Date   COLONOSCOPY N/A 10/09/2015   Procedure: COLONOSCOPY;  Surgeon: Wallace Cullens, MD;  Location: [facility] ENDOSCOPY;  Service: Gastroenterology;  Laterality: N/A;   CORONARY STENT INTERVENTION N/A 10/22/2021   Procedure: CORONARY STENT INTERVENTION;  Surgeon: Iran Ouch, MD;  Location: [facility] INVASIVE CV LAB;  Service: Cardiovascular;  Laterality: N/A;   IR CT HEAD LTD  05/21/2022   IR PERCUTANEOUS ART THROMBECTOMY/INFUSION INTRACRANIAL INC DIAG ANGIO  05/21/2022   LEFT HEART CATH AND CORONARY ANGIOGRAPHY N/A 10/22/2021   Procedure: LEFT HEART CATH  AND CORONARY ANGIOGRAPHY;  Surgeon: Iran Ouch, MD;  Location: [facility] INVASIVE CV LAB;  Service: Cardiovascular;  Laterality: N/A;   LOOP RECORDER INSERTION N/A 05/23/2022   Procedure: LOOP RECORDER INSERTION;  Surgeon: Lanier Prude, MD;  Location: [facility] INVASIVE CV LAB;  Service: Cardiovascular;  Laterality: N/A;   RADIOLOGY WITH ANESTHESIA N/A 05/20/2022   Procedure: IR WITH ANESTHESIA;  Surgeon: Julieanne Cotton, MD;  Location: [facility] OR;  Service: Radiology;  Laterality: N/A;   ROTATOR CUFF REPAIR     Patient Active Problem List   Diagnosis Date Noted   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 male with PMH: HTN, CAD, HLD, osteoarthritis, SCC, sleep apnea. He presented to the hospital on 05/20/22 with right sided weakness and aphasia with LKW reported by wife to be 10pm on 10/2.  He arrived at [facility]  as code stroke and taken for emergent CT/CTA which demonstrated left M1 occlusion. He received IV tenecteplase and IR thrombectomy due to large vessel occlusion.       DIAGNOSTIC FINDINGS:  MRI 05/22/2022 Cortical ischemia throughout the left MCA territory compatible with recent infarct. Relatively little affect on the white matter. 2. Small focus of acute hemorrhage along the anterior surface of the left temporal lobe Heidelberg classification 3c: Subarachnoid hemorrhage. 3. Normal intracranial MRA.  Restored flow in left MCA.    THERAPY DIAG:  Aphasia  Apraxia  Middle cerebral artery embolism, left  Rationale for Evaluation  and Treatment Rehabilitation  SUBJECTIVE:   PAIN:  Are you having pain? No  PATIENT GOALS: to be able to communicate again  SUBJECTIVE STATEMENT: Pt gestured that he didn't understand his lock screen Pt accompanied by: self and significant other  OBJECTIVE:   TODAY'S TREATMENT: Skilled treatment session focused on pt's communication goals. SLP facilitated session by providing the following interventions:  Pt arrived to session with information pre-typed in TEXT to SPEECH app to tell this Clinical research associate   To build word finding and reading comprehension, Constant Therapy Clinician app utilized  Read a word you hear - Level 1 - with repetition of target word 90% Read word descriptions (Minimal Pairs) - Level 1 - 90% with repetition x2 of target words Level 2 - with repetition x 2 - 80% Identify real words - Level 1 - 100% Categories - Level 3 - 80%   PATIENT EDUCATION: Education details: see above Person educated: Patient and Wife Education method: Programmer, multimedia, Demonstration, Verbal cues, and Handouts Education comprehension: verbalized understanding  HOME EXERCISE PROGRAM: Using pt's word search puzzles, enter the words into Text to Speech App and then repeat them to his wife - to improve verbal interaction during the evenings GOALS: Goals reviewed with patient? Yes  SHORT TERM GOALS: Target date: 10 sessions  UPDATED: 01/20/2023   UPDATED: 02/12/2023 3.   Pt will improve wording finding abilities by writing 3 or more words during communication exchanges to increase listener understanding with moderate A in 5 out of 7 opportunities; ONGOING; progress made 5. Pt will improve spelling ability by accurately spelling SEMI-COMPLEX words during communication exchanges to increase listener understanding with min A in 5 out of 7 opportunities; INITIAL; progress made 6.  Pt will read self-generated words during communication exchanges with > 50% speech intelligibility given Min A  articulatory cues; INITIAL; progress made   LONG TERM GOALS: Target date: 04/14/2023    UPDATED 01/20/2023  Pt will use multimodal communication to express basic wants/needs in 4 of 10 opportunities.  Baseline: unable Goal status: ONGOING; ONGOING    2.  Pt will demonstrate increased reading comprehension by selecting accurate answer from 3 choices in 8 out of 10 opportunities.  Goal status: ONGOING; MET (01/20/2023)  ASSESSMENT:  CLINICAL IMPRESSION: Pt is a 77 year old male who was seen today for therapy targeting his aphasia. Pt with improved communication using text to speech app. Increased social response when using Text to Speech app noted in today's session. See the above treatment note for details.   OBJECTIVE IMPAIRMENTS include expressive language, receptive language, aphasia, and apraxia. These impairments are limiting patient from managing medications, managing appointments, managing finances, household responsibilities, ADLs/IADLs, and effectively communicating at home and in community. Factors affecting potential to achieve goals and functional outcome are severity of impairments. Patient will benefit from skilled SLP services to address above impairments and improve overall function.  REHAB  POTENTIAL: Excellent  PLAN: SLP FREQUENCY: 3x/week  SLP DURATION: 12 weeks  PLANNED INTERVENTIONS: Language facilitation, Environmental controls, Internal/external aids, Functional tasks, Multimodal communication approach, SLP instruction and feedback, Compensatory strategies, and Patient/family education    Branson Kranz B. Dreama Saa, M.S., CCC-SLP, Tree surgeon Certified Brain Injury Specialist [facility]  [facility] Rehabilitation Services Office [phone] Ascom [phone] Fax [phone]

## 2023-03-20 ENCOUNTER — Other Ambulatory Visit: Payer: Self-pay | Admitting: Nurse Practitioner

## 2023-03-20 NOTE — Telephone Encounter (Signed)
Pt overdue for 6 month f/u. Please contact pt for future appointment. Pt hasn't been seen in over a year needing refills.

## 2023-03-21 ENCOUNTER — Encounter: Payer: Medicare HMO | Admitting: Speech Pathology

## 2023-03-21 ENCOUNTER — Ambulatory Visit: Payer: Medicare HMO | Admitting: Speech Pathology

## 2023-03-24 ENCOUNTER — Ambulatory Visit: Payer: Medicare HMO | Admitting: Speech Pathology

## 2023-03-26 ENCOUNTER — Ambulatory Visit: Payer: Medicare HMO | Admitting: Speech Pathology

## 2023-03-28 ENCOUNTER — Ambulatory Visit: Payer: Medicare HMO | Admitting: Speech Pathology

## 2023-03-28 ENCOUNTER — Ambulatory Visit: Payer: Medicare HMO | Attending: Internal Medicine | Admitting: Speech Pathology

## 2023-03-28 DIAGNOSIS — R4701 Aphasia: Secondary | ICD-10-CM | POA: Diagnosis not present

## 2023-03-28 DIAGNOSIS — R482 Apraxia: Secondary | ICD-10-CM | POA: Diagnosis not present

## 2023-03-28 DIAGNOSIS — I6602 Occlusion and stenosis of left middle cerebral artery: Secondary | ICD-10-CM | POA: Diagnosis not present

## 2023-03-28 NOTE — Therapy (Signed)
OUTPATIENT SPEECH LANGUAGE PATHOLOGY TREATMENT NOTE    Patient Name: Alan Stewart MRN: [id number] DOB:1946/05/06, 77 y.o., male Today's Date: 03/28/2023   PCP: Daniel Nones, MD REFERRING PROVIDER: Daniel Nones, MD   END OF SESSION  End of Session - 03/28/23 1016     Visit Number 103    Number of Visits 117    Date for SLP Re-Evaluation 04/14/23    Authorization Type [employer] HMO    Authorization Time Period 03/21/2023 thru 05/16/2023    Authorization - Visit Number 1    Authorization - Number of Visits 16    Progress Note Due on Visit 110    SLP Start Time 1015    SLP Stop Time  1100    SLP Time Calculation (min) 45 min    Activity Tolerance Patient tolerated treatment well              Past Medical History:  Diagnosis Date   CAD (coronary artery disease)    a. 10/2021 NSTEMI/PCI: LM nl, LAD min irregs, LCX 42m (2.5x18 Onyx Frontier DES), OM1 nl, RCA 64m (3.5x26 Onyx Frontier DES), 30d.   Diastolic dysfunction    a. 10/2021 Echo: EF 50-55%, no rwma, Gr1 DD, nl RV fxn. No significant valvular dzs.   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 SCCis. MOHs 07/08/22   Past Surgical History:  Procedure Laterality Date   COLONOSCOPY N/A 10/09/2015   Procedure: COLONOSCOPY;  Surgeon: Wallace Cullens, MD;  Location: [facility] ENDOSCOPY;  Service: Gastroenterology;  Laterality: N/A;   CORONARY STENT INTERVENTION N/A 10/22/2021   Procedure: CORONARY STENT INTERVENTION;  Surgeon: Iran Ouch, MD;  Location: [facility] INVASIVE CV LAB;  Service: Cardiovascular;  Laterality: N/A;   IR CT HEAD LTD  05/21/2022   IR PERCUTANEOUS ART THROMBECTOMY/INFUSION INTRACRANIAL INC DIAG ANGIO  05/21/2022   LEFT HEART CATH AND CORONARY ANGIOGRAPHY N/A 10/22/2021   Procedure: LEFT HEART CATH AND  CORONARY ANGIOGRAPHY;  Surgeon: Iran Ouch, MD;  Location: [facility] INVASIVE CV LAB;  Service: Cardiovascular;  Laterality: N/A;   LOOP RECORDER INSERTION N/A 05/23/2022   Procedure: LOOP RECORDER INSERTION;  Surgeon: Lanier Prude, MD;  Location: [facility] INVASIVE CV LAB;  Service: Cardiovascular;  Laterality: N/A;   RADIOLOGY WITH ANESTHESIA N/A 05/20/2022   Procedure: IR WITH ANESTHESIA;  Surgeon: Julieanne Cotton, MD;  Location: [facility] OR;  Service: Radiology;  Laterality: N/A;   ROTATOR CUFF REPAIR     Patient Active Problem List   Diagnosis Date Noted   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 male with PMH: HTN, CAD, HLD, osteoarthritis, SCC, sleep apnea. He presented to the hospital on 05/20/22 with right sided weakness and aphasia with LKW reported by wife to be 10pm on 10/2.  He arrived at [facility]  as code stroke and taken for emergent CT/CTA which demonstrated left M1 occlusion. He received IV tenecteplase and IR thrombectomy due to large vessel occlusion.       DIAGNOSTIC FINDINGS:  MRI 05/22/2022 Cortical ischemia throughout the left MCA territory compatible with recent infarct. Relatively little affect on the white matter. 2. Small focus of acute hemorrhage along the anterior surface of the left temporal lobe Heidelberg classification 3c: Subarachnoid hemorrhage. 3. Normal intracranial MRA.  Restored flow in left MCA.    THERAPY DIAG:  Aphasia  Apraxia  Middle cerebral artery embolism, left  Rationale for Evaluation and  Treatment Rehabilitation  SUBJECTIVE:   PAIN:  Are you having pain? No  PATIENT GOALS: to be able to communicate again  SUBJECTIVE STATEMENT: Pt gestured that he didn't understand his lock screen Pt accompanied by: self and significant other  OBJECTIVE:   TODAY'S TREATMENT: Skilled treatment session focused on pt's communication goals. SLP facilitated session by providing the following interventions:  Pt arrived to session with information pre-typed in TEXT to SPEECH app to tell this Clinical research associate   To build word finding and reading comprehension, Constant Therapy Clinician app utilized  Hear word descriptions (minimal pairs) Level 3 - 72%; Level 1 - 90% Rread wor descriptions (minimal pairs) Level 1 - 90%  PATIENT EDUCATION: Education details: see above Person educated: Patient and Wife Education method: Programmer, multimedia, Demonstration, Verbal cues, and Handouts Education comprehension: verbalized understanding  HOME EXERCISE PROGRAM: Using pt's word search puzzles, enter the words into Text to Speech App and then repeat them to his wife - to improve verbal interaction during the evenings GOALS: Goals reviewed with patient? Yes  SHORT TERM GOALS: Target date: 10 sessions  UPDATED: 01/20/2023   UPDATED: 02/12/2023 3.   Pt will improve wording finding abilities by writing 3 or more words during communication exchanges to increase listener understanding with moderate A in 5 out of 7 opportunities; ONGOING; progress made 5. Pt will improve spelling ability by accurately spelling SEMI-COMPLEX words during communication exchanges to increase listener understanding with min A in 5 out of 7 opportunities; INITIAL; progress made 6.  Pt will read self-generated words during communication exchanges with > 50% speech intelligibility given Min A articulatory cues; INITIAL; progress made   LONG TERM GOALS: Target date: 04/14/2023    UPDATED 01/20/2023  Pt will use multimodal communication to  express basic wants/needs in 4 of 10 opportunities.  Baseline: unable Goal status: ONGOING; ONGOING    2.  Pt will demonstrate increased reading comprehension by selecting accurate answer from 3 choices in 8 out of 10 opportunities.  Goal status: ONGOING; MET (01/20/2023)  ASSESSMENT:  CLINICAL IMPRESSION: Pt is a 77 year old male who was seen today for therapy targeting his aphasia. Pt with improved communication using text to speech app. Increased social response when using Text to Speech app noted in today's session. See the above treatment note for details.   OBJECTIVE IMPAIRMENTS include expressive language, receptive language, aphasia, and apraxia. These impairments are limiting patient from managing medications, managing appointments, managing finances, household responsibilities, ADLs/IADLs, and effectively communicating at home and in community. Factors affecting potential to achieve goals and functional outcome are severity of impairments. Patient will benefit from skilled SLP services to address above impairments and improve overall function.  REHAB POTENTIAL: Excellent  PLAN: SLP FREQUENCY: 3x/week  SLP DURATION: 12 weeks  PLANNED INTERVENTIONS: Language facilitation, Environmental controls, Internal/external aids, Functional tasks, Multimodal communication approach, SLP instruction and feedback, Compensatory strategies, and  Patient/family education    Talicia Sui B. Dreama Saa, M.S., CCC-SLP, Tree surgeon Certified Brain Injury Specialist [facility]  [facility] Rehabilitation Services Office [phone] Ascom [phone] Fax [phone]

## 2023-03-31 ENCOUNTER — Ambulatory Visit (INDEPENDENT_AMBULATORY_CARE_PROVIDER_SITE_OTHER): Payer: Medicare HMO

## 2023-03-31 ENCOUNTER — Ambulatory Visit: Payer: Medicare HMO | Admitting: Speech Pathology

## 2023-03-31 DIAGNOSIS — I6602 Occlusion and stenosis of left middle cerebral artery: Secondary | ICD-10-CM | POA: Diagnosis not present

## 2023-03-31 DIAGNOSIS — R4701 Aphasia: Secondary | ICD-10-CM | POA: Diagnosis not present

## 2023-03-31 DIAGNOSIS — I63412 Cerebral infarction due to embolism of left middle cerebral artery: Secondary | ICD-10-CM | POA: Diagnosis not present

## 2023-03-31 DIAGNOSIS — R482 Apraxia: Secondary | ICD-10-CM

## 2023-03-31 NOTE — Therapy (Signed)
OUTPATIENT SPEECH LANGUAGE PATHOLOGY TREATMENT NOTE    Patient Name: Alan Stewart MRN: [id number] DOB:30-May-1946, 77 y.o., male Today's Date: 03/31/2023   PCP: Daniel Nones, MD REFERRING PROVIDER: Daniel Nones, MD   END OF SESSION  End of Session - 03/31/23 1058     Visit Number 104    Number of Visits 117    Date for SLP Re-Evaluation 04/14/23    Authorization Type [employer] H[employer]    Authorization Time Period 03/21/2023 thru 05/16/2023    Authorization - Visit Number 2    Authorization - Number of Visits 16    Progress Note Due on Visit 110    SLP Start Time 1015    SLP Stop Time  1100    SLP Time Calculation (min) 45 min    Activity Tolerance Patient tolerated treatment well              Past Medical History:  Diagnosis Date   CAD (coronary artery disease)    a. 10/2021 NSTEMI/PCI: LM nl, LAD min irregs, LCX 98m (2.5x18 Onyx Frontier DES), OM1 nl, RCA 48m (3.5x26 Onyx Frontier DES), 30d.   Diastolic dysfunction    a. 10/2021 Echo: EF 50-55%, no rwma, Gr1 DD, nl RV fxn. No significant valvular dzs.   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 SCCis. MOHs 07/08/22   Past Surgical History:  Procedure Laterality Date   COLONOSCOPY N/A 10/09/2015   Procedure: COLONOSCOPY;  Surgeon: Wallace Cullens, MD;  Location: [facility] ENDOSCOPY;  Service: Gastroenterology;  Laterality: N/A;   CORONARY STENT INTERVENTION N/A 10/22/2021   Procedure: CORONARY STENT INTERVENTION;  Surgeon: Iran Ouch, MD;  Location: [facility] INVASIVE CV LAB;  Service: Cardiovascular;  Laterality: N/A;   IR CT HEAD LTD  05/21/2022   IR PERCUTANEOUS ART THROMBECTOMY/INFUSION INTRACRANIAL INC DIAG ANGIO  05/21/2022   LEFT HEART CATH AND CORONARY ANGIOGRAPHY N/A 10/22/2021   Procedure: LEFT HEART CATH AND  CORONARY ANGIOGRAPHY;  Surgeon: Iran Ouch, MD;  Location: [facility] INVASIVE CV LAB;  Service: Cardiovascular;  Laterality: N/A;   LOOP RECORDER INSERTION N/A 05/23/2022   Procedure: LOOP RECORDER INSERTION;  Surgeon: Lanier Prude, MD;  Location: [facility] INVASIVE CV LAB;  Service: Cardiovascular;  Laterality: N/A;   RADIOLOGY WITH ANESTHESIA N/A 05/20/2022   Procedure: IR WITH ANESTHESIA;  Surgeon: Julieanne Cotton, MD;  Location: [facility] OR;  Service: Radiology;  Laterality: N/A;   ROTATOR CUFF REPAIR     Patient Active Problem List   Diagnosis Date Noted   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 male with PMH: HTN, CAD, HLD, osteoarthritis, SCC, sleep apnea. He presented to the hospital on 05/20/22 with right sided weakness and aphasia with LKW reported by wife to be 10pm on 10/2.  He arrived at [facility]  as code stroke and taken for emergent CT/CTA which demonstrated left M1 occlusion. He received IV tenecteplase and IR thrombectomy due to large vessel occlusion.       DIAGNOSTIC FINDINGS:  MRI 05/22/2022 Cortical ischemia throughout the left MCA territory compatible with recent infarct. Relatively little affect on the white matter. 2. Small focus of acute hemorrhage along the anterior surface of the left temporal lobe Heidelberg classification 3c: Subarachnoid hemorrhage. 3. Normal intracranial MRA.  Restored flow in left MCA.    THERAPY DIAG:  Aphasia  Apraxia  Middle cerebral artery embolism, left  Rationale for Evaluation and  Treatment Rehabilitation  SUBJECTIVE:   PAIN:  Are you having pain? No  PATIENT GOALS: to be able to communicate again  SUBJECTIVE STATEMENT: Pt gestured that he didn't understand his lock screen Pt accompanied by: self and significant other  OBJECTIVE:   TODAY'S TREATMENT: Skilled treatment session focused on pt's communication goals. SLP facilitated session by providing the following interventions:  Pt's wife continues to report that pt has decreased interest in verbal communication. Prior to the stroke, pt was "quiet natured" but communicated when going outside to mow the lawn, asking her questions about work etc. Pt currently expresses that there isn't a need to communicate information such as going outside to mow because that is obvious to his wife where he is going. Pt with difficulty understanding that he needs to practice communicating to improve language function in other situations such as potentially returning to driving. Pt has not demonstrated the ability to understand this concept.   SLP provided instruction on creating list of functional sentences to add to "starred" section in the Text to Speech app. Pt with moderate assistance to locate sentences. Maximal assistance required to create sentences as pt wrote one word responses or had semantic paraphasias when attempting 2 word noun/verb phrases.   PATIENT EDUCATION: Education details: see above Person educated: Patient and Wife Education method: Programmer, multimedia, Facilities manager, Verbal cues, and Handouts Education comprehension: verbalized understanding  HOME EXERCISE PROGRAM: Using pt's word search puzzles, enter the words into Text to Speech App and then repeat them to his wife - to improve verbal interaction during the evenings GOALS: Goals reviewed with patient? Yes  SHORT TERM GOALS: Target date: 10 sessions  UPDATED: 01/20/2023   UPDATED: 02/12/2023 3.   Pt will improve wording finding abilities by writing 3 or more  words during communication exchanges to increase listener understanding with moderate A in 5 out of 7 opportunities; ONGOING; progress made 5. Pt will improve spelling ability by accurately spelling SEMI-COMPLEX words during communication exchanges to increase listener understanding with min A in 5 out of 7 opportunities; INITIAL; progress made 6.  Pt will read self-generated words during communication exchanges with > 50% speech intelligibility given Min A articulatory cues; INITIAL; progress made   LONG TERM GOALS: Target date: 04/14/2023    UPDATED 01/20/2023  Pt will use multimodal communication to express basic wants/needs in 4 of 10 opportunities.  Baseline: unable Goal status: ONGOING; ONGOING    2.  Pt will demonstrate increased reading comprehension by selecting accurate answer from 3 choices in 8 out of 10 opportunities.  Goal status: ONGOING; MET (01/20/2023)  ASSESSMENT:  CLINICAL IMPRESSION: Pt is a 77 year old male who was seen today for therapy targeting his aphasia. Pt with improved communication using text to speech app. Increased social response when using Text to Speech app noted in today's session. See the above treatment note for details.  OBJECTIVE IMPAIRMENTS include expressive language, receptive language, aphasia, and apraxia. These impairments are limiting patient from managing medications, managing appointments, managing finances, household responsibilities, ADLs/IADLs, and effectively communicating at home and in community. Factors affecting potential to achieve goals and functional outcome are severity of impairments. Patient will benefit from skilled SLP services to address above impairments and improve overall function.  REHAB POTENTIAL: Excellent  PLAN: SLP FREQUENCY: 3x/week  SLP DURATION: 12 weeks  PLANNED INTERVENTIONS: Language facilitation, Environmental controls, Internal/external aids, Functional tasks, Multimodal communication approach, SLP  instruction and feedback, Compensatory strategies, and Patient/family education    Elihu Milstein B. Dreama Saa, M.S., CCC-SLP, Tree surgeon Certified Brain Injury Specialist [facility]  [facility] Rehabilitation Services Office [phone] Ascom [phone] Fax [phone]

## 2023-04-02 ENCOUNTER — Ambulatory Visit: Payer: Medicare HMO | Admitting: Speech Pathology

## 2023-04-02 DIAGNOSIS — R482 Apraxia: Secondary | ICD-10-CM

## 2023-04-02 DIAGNOSIS — I6602 Occlusion and stenosis of left middle cerebral artery: Secondary | ICD-10-CM

## 2023-04-02 DIAGNOSIS — R4701 Aphasia: Secondary | ICD-10-CM | POA: Diagnosis not present

## 2023-04-03 NOTE — Therapy (Signed)
OUTPATIENT SPEECH LANGUAGE PATHOLOGY TREATMENT NOTE    Patient Name: Alan Stewart MRN: [id number] DOB:[birth year], 77 y.o., male Today's Date: 04/02/2023   PCP: Daniel Nones, MD REFERRING PROVIDER: Daniel Nones, MD   END OF SESSION  End of Session - 04/02/23 0820     Visit Number 105    Number of Visits 117    Date for SLP Re-Evaluation 04/14/23    Authorization Type [employer] H[employer]    Authorization Time Period 03/21/2023 thru 05/16/2023    Authorization - Visit Number 3    Authorization - Number of Visits 16    Progress Note Due on Visit 110    SLP Start Time 1015    SLP Stop Time  1100    SLP Time Calculation (min) 45 min    Activity Tolerance Patient tolerated treatment well              Past Medical History:  Diagnosis Date   CAD (coronary artery disease)    a. 10/2021 NSTEMI/PCI: LM nl, LAD min irregs, LCX 90m (2.5x18 Onyx Frontier DES), OM1 nl, RCA 45m (3.5x26 Onyx Frontier DES), 30d.   Diastolic dysfunction    a. 10/2021 Echo: EF 50-55%, no rwma, Gr1 DD, nl RV fxn. No significant valvular dzs.   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 SCCis. MOHs 07/08/22   Past Surgical History:  Procedure Laterality Date   COLONOSCOPY N/A 10/09/2015   Procedure: COLONOSCOPY;  Surgeon: Wallace Cullens, MD;  Location: [facility] ENDOSCOPY;  Service: Gastroenterology;  Laterality: N/A;   CORONARY STENT INTERVENTION N/A 10/22/2021   Procedure: CORONARY STENT INTERVENTION;  Surgeon: Iran Ouch, MD;  Location: [facility] INVASIVE CV LAB;  Service: Cardiovascular;  Laterality: N/A;   IR CT HEAD LTD  05/21/2022   IR PERCUTANEOUS ART THROMBECTOMY/INFUSION INTRACRANIAL INC DIAG ANGIO  05/21/2022   LEFT HEART CATH AND CORONARY ANGIOGRAPHY N/A 10/22/2021   Procedure: LEFT HEART CATH AND  CORONARY ANGIOGRAPHY;  Surgeon: Iran Ouch, MD;  Location: [facility] INVASIVE CV LAB;  Service: Cardiovascular;  Laterality: N/A;   LOOP RECORDER INSERTION N/A 05/23/2022   Procedure: LOOP RECORDER INSERTION;  Surgeon: Lanier Prude, MD;  Location: [facility] INVASIVE CV LAB;  Service: Cardiovascular;  Laterality: N/A;   RADIOLOGY WITH ANESTHESIA N/A 05/20/2022   Procedure: IR WITH ANESTHESIA;  Surgeon: Julieanne Cotton, MD;  Location: [facility] OR;  Service: Radiology;  Laterality: N/A;   ROTATOR CUFF REPAIR     Patient Active Problem List   Diagnosis Date Noted   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 male with PMH: HTN, CAD, HLD, osteoarthritis, SCC, sleep apnea. He presented to the hospital on 05/20/22 with right sided weakness and aphasia with LKW reported by wife to be 10pm on 10/2.  He arrived at [facility]  as code stroke and taken for emergent CT/CTA which demonstrated left M1 occlusion. He received IV tenecteplase and IR thrombectomy due to large vessel occlusion.       DIAGNOSTIC FINDINGS:  MRI 05/22/2022 Cortical ischemia throughout the left MCA territory compatible with recent infarct. Relatively little affect on the white matter. 2. Small focus of acute hemorrhage along the anterior surface of the left temporal lobe Heidelberg classification 3c: Subarachnoid hemorrhage. 3. Normal intracranial MRA.  Restored flow in left MCA.    THERAPY DIAG:  Aphasia  Apraxia  Middle cerebral artery embolism, left  Rationale for Evaluation and  Treatment Rehabilitation  SUBJECTIVE:   PAIN:  Are you having pain? No  PATIENT GOALS: to be able to communicate again  SUBJECTIVE STATEMENT: "He is talking more at home, since last visit" Pt accompanied by: self and significant other  OBJECTIVE:   TODAY'S TREATMENT: Skilled treatment session focused on pt's communication goals. SLP facilitated session by providing the following interventions:   To target sentence generation and word finding, Scientist, product/process development (VNeST) was utilized. The pt generated 3 subjects and objects for 4 verbs, for a total of 12 subject objects. Pt required minimal cues. Pt generated 12 basic functional sentences by answering "wh" questions. Pt required moderate to minimal cues to generate basic sentences. Pt with difficulty self-monitoring verbal errors of perseveration during today's session. He benefited from having information played on Text to Speech app as his speech intelligibility/motor movement patterns improved to 90% when repeating phrases/sentences  PATIENT EDUCATION: Education details: see above Person educated: Patient and Wife Education method: Explanation, Facilities manager, Verbal cues, and Handouts Education comprehension: verbalized understanding  HOME EXERCISE PROGRAM: Continue practicing verbal sentences found within Text to Speech App  GOALS: Goals reviewed with patient? Yes  SHORT TERM GOALS: Target date: 10 sessions  UPDATED: 01/20/2023   UPDATED: 02/12/2023 3.   Pt will improve wording finding abilities by writing 3 or more words during communication exchanges to increase listener understanding with moderate A in 5 out of 7 opportunities; ONGOING; progress made 5. Pt will improve spelling ability by accurately spelling SEMI-COMPLEX words during communication exchanges to increase listener understanding with min A in 5 out of 7 opportunities; INITIAL; progress made 6.  Pt will read self-generated words during  communication exchanges with > 50% speech intelligibility given Min A articulatory cues; INITIAL; progress made   LONG TERM GOALS: Target date: 04/14/2023    UPDATED 01/20/2023  Pt will use multimodal communication to express basic wants/needs in 4 of 10 opportunities.  Baseline: unable Goal status: ONGOING; ONGOING    2.  Pt will demonstrate increased reading comprehension by selecting accurate answer from 3 choices in 8 out of 10 opportunities.  Goal status: ONGOING; MET (01/20/2023)  ASSESSMENT:  CLINICAL IMPRESSION: Pt is a 78 year old male who was seen today for therapy targeting his aphasia. Pt with improved communication using text to speech app. Increased speech intelligibility when using Text to Speech app today for VNEST. See the above treatment note for details.   OBJECTIVE IMPAIRMENTS include expressive language, receptive language, aphasia, and apraxia. These impairments are limiting patient from managing medications, managing appointments, managing finances, household responsibilities, ADLs/IADLs, and effectively communicating at home and in community. Factors affecting potential to achieve goals and functional outcome are severity of impairments. Patient will benefit from skilled SLP services to address above impairments and improve overall function.  REHAB POTENTIAL: Excellent  PLAN: SLP FREQUENCY: 3x/week  SLP DURATION: 12 weeks  PLANNED  INTERVENTIONS: Language facilitation, Environmental controls, Internal/external aids, Functional tasks, Multimodal communication approach, SLP instruction and feedback, Compensatory strategies, and Patient/family education    Oluwadara Gorman B. Dreama Saa, M.S., CCC-SLP, Tree surgeon Certified Brain Injury Specialist [facility]  [facility] Rehabilitation Services Office [phone] Ascom [phone] Fax [phone]

## 2023-04-04 ENCOUNTER — Ambulatory Visit: Payer: Medicare HMO | Admitting: Speech Pathology

## 2023-04-04 DIAGNOSIS — R482 Apraxia: Secondary | ICD-10-CM | POA: Diagnosis not present

## 2023-04-04 DIAGNOSIS — I6602 Occlusion and stenosis of left middle cerebral artery: Secondary | ICD-10-CM

## 2023-04-04 DIAGNOSIS — R4701 Aphasia: Secondary | ICD-10-CM

## 2023-04-04 NOTE — Therapy (Signed)
OUTPATIENT SPEECH LANGUAGE PATHOLOGY TREATMENT NOTE    Patient Name: Alan Stewart MRN: [id number] DOB:[birth year], 77 y.o., male Today's Date: 04/02/2023   PCP: Daniel Nones, MD REFERRING PROVIDER: Daniel Nones, MD   END OF SESSION  End of Session - 04/02/23 0820     Visit Number 105    Number of Visits 117    Date for SLP Re-Evaluation 04/14/23    Authorization Type [employer] H[employer]    Authorization Time Period 03/21/2023 thru 05/16/2023    Authorization - Visit Number 3    Authorization - Number of Visits 16    Progress Note Due on Visit 110    SLP Start Time 1015    SLP Stop Time  1100    SLP Time Calculation (min) 45 min    Activity Tolerance Patient tolerated treatment well              Past Medical History:  Diagnosis Date   CAD (coronary artery disease)    a. 10/2021 NSTEMI/PCI: LM nl, LAD min irregs, LCX 57m (2.5x18 Onyx Frontier DES), OM1 nl, RCA 29m (3.5x26 Onyx Frontier DES), 30d.   Diastolic dysfunction    a. 10/2021 Echo: EF 50-55%, no rwma, Gr1 DD, nl RV fxn. No significant valvular dzs.   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 SCCis. MOHs 07/08/22   Past Surgical History:  Procedure Laterality Date   COLONOSCOPY N/A 10/09/2015   Procedure: COLONOSCOPY;  Surgeon: Wallace Cullens, MD;  Location: [facility] ENDOSCOPY;  Service: Gastroenterology;  Laterality: N/A;   CORONARY STENT INTERVENTION N/A 10/22/2021   Procedure: CORONARY STENT INTERVENTION;  Surgeon: Iran Ouch, MD;  Location: [facility] INVASIVE CV LAB;  Service: Cardiovascular;  Laterality: N/A;   IR CT HEAD LTD  05/21/2022   IR PERCUTANEOUS ART THROMBECTOMY/INFUSION INTRACRANIAL INC DIAG ANGIO  05/21/2022   LEFT HEART CATH AND CORONARY ANGIOGRAPHY N/A 10/22/2021   Procedure: LEFT HEART CATH AND  CORONARY ANGIOGRAPHY;  Surgeon: Iran Ouch, MD;  Location: [facility] INVASIVE CV LAB;  Service: Cardiovascular;  Laterality: N/A;   LOOP RECORDER INSERTION N/A 05/23/2022   Procedure: LOOP RECORDER INSERTION;  Surgeon: Lanier Prude, MD;  Location: [facility] INVASIVE CV LAB;  Service: Cardiovascular;  Laterality: N/A;   RADIOLOGY WITH ANESTHESIA N/A 05/20/2022   Procedure: IR WITH ANESTHESIA;  Surgeon: Julieanne Cotton, MD;  Location: [facility] OR;  Service: Radiology;  Laterality: N/A;   ROTATOR CUFF REPAIR     Patient Active Problem List   Diagnosis Date Noted   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 male with PMH: HTN, CAD, HLD, osteoarthritis, SCC, sleep apnea. He presented to the hospital on 05/20/22 with right sided weakness and aphasia with LKW reported by wife to be 10pm on 10/2.  He arrived at [facility]  as code stroke and taken for emergent CT/CTA which demonstrated left M1 occlusion. He received IV tenecteplase and IR thrombectomy due to large vessel occlusion.       DIAGNOSTIC FINDINGS:  MRI 05/22/2022 Cortical ischemia throughout the left MCA territory compatible with recent infarct. Relatively little affect on the white matter. 2. Small focus of acute hemorrhage along the anterior surface of the left temporal lobe Heidelberg classification 3c: Subarachnoid hemorrhage. 3. Normal intracranial MRA.  Restored flow in left MCA.    THERAPY DIAG:  Aphasia  Apraxia  Middle cerebral artery embolism, left  Rationale for Evaluation and  Treatment Rehabilitation  SUBJECTIVE:   PAIN:  Are you having pain? No  PATIENT GOALS: to be able to communicate again  SUBJECTIVE STATEMENT: "He is talking more at home, since last visit" Pt accompanied by: self and significant other  OBJECTIVE:   TODAY'S TREATMENT: Skilled treatment session focused on pt's communication goals. SLP facilitated session by providing the following interventions:  Moderate assistance to improve speech intelligibility specifically including all words contained within each sentence. With cues, pt able to achieve > 90% speech intelligibility.   PATIENT EDUCATION: Education details: see above Person educated: Patient and Wife Education method: Explanation, Facilities manager, Verbal cues, and Handouts Education comprehension: verbalized understanding  HOME EXERCISE PROGRAM: Continue practicing verbal sentences found within Text to Speech App  GOALS: Goals reviewed with patient? Yes  SHORT TERM GOALS: Target date: 10 sessions  UPDATED: 01/20/2023   UPDATED: 02/12/2023 3.   Pt will improve wording finding abilities by writing 3 or more words during communication exchanges to increase listener understanding with moderate A in 5 out of 7 opportunities; ONGOING; progress made 5. Pt will improve spelling ability by accurately spelling SEMI-COMPLEX words during communication exchanges to increase listener understanding with min A in 5 out of 7 opportunities; INITIAL; progress made 6.  Pt will read self-generated words during communication exchanges with > 50% speech intelligibility given Min A articulatory cues; INITIAL; progress made   LONG TERM GOALS: Target date: 04/14/2023    UPDATED 01/20/2023  Pt will use multimodal communication to express basic wants/needs in 4 of 10 opportunities.  Baseline: unable Goal status: ONGOING; ONGOING    2.  Pt will demonstrate increased reading comprehension by selecting accurate answer from 3 choices in 8 out of 10  opportunities.  Goal status: ONGOING; MET (01/20/2023)  ASSESSMENT:  CLINICAL IMPRESSION: Pt is a 77 year old male who was seen today for therapy targeting his aphasia. Pt with improved communication using text to speech app. Increased speech intelligibility when using Text to Speech app today for VNEST. See the above treatment note for details.   OBJECTIVE IMPAIRMENTS include expressive language, receptive language, aphasia, and apraxia. These impairments are limiting patient from managing medications, managing appointments, managing finances, household responsibilities, ADLs/IADLs, and effectively communicating at home and in community. Factors affecting potential to achieve goals and functional outcome are severity of impairments. Patient will benefit from skilled SLP services to address above impairments and improve overall function.  REHAB POTENTIAL: Excellent  PLAN: SLP FREQUENCY: 3x/week  SLP DURATION: 12 weeks  PLANNED INTERVENTIONS: Language facilitation, Environmental controls, Internal/external aids, Functional tasks, Multimodal communication approach, SLP instruction and feedback, Compensatory strategies, and Patient/family education    Charnetta Wulff B. Dreama Saa, M.S., CCC-SLP, Tree surgeon Certified Brain Injury Specialist [facility]  [facility] Rehabilitation Services Office [phone] Ascom [phone] Fax [phone]

## 2023-04-07 ENCOUNTER — Ambulatory Visit: Payer: Medicare HMO | Admitting: Speech Pathology

## 2023-04-07 DIAGNOSIS — R482 Apraxia: Secondary | ICD-10-CM | POA: Diagnosis not present

## 2023-04-07 DIAGNOSIS — R4701 Aphasia: Secondary | ICD-10-CM

## 2023-04-07 DIAGNOSIS — I6602 Occlusion and stenosis of left middle cerebral artery: Secondary | ICD-10-CM | POA: Diagnosis not present

## 2023-04-07 NOTE — Therapy (Signed)
OUTPATIENT SPEECH LANGUAGE PATHOLOGY TREATMENT NOTE    Patient Name: Alan Stewart MRN: [id number] DOB:03/06/1946, 77 y.o., male Today's Date: 04/07/2023    PCP: Daniel Nones, MD REFERRING PROVIDER: Daniel Nones, MD   END OF SESSION  End of Session - 04/07/23 1019     Visit Number 107    Number of Visits 117    Date for SLP Re-Evaluation 04/14/23    Authorization Type [employer] H[employer]    Authorization Time Period 03/21/2023 thru 05/16/2023    Authorization - Visit Number 5    Authorization - Number of Visits 16    Progress Note Due on Visit 110    SLP Start Time 1015    SLP Stop Time  1100    SLP Time Calculation (min) 45 min    Activity Tolerance Patient tolerated treatment well                   Past Medical History:  Diagnosis Date   CAD (coronary artery disease)    a. 10/2021 NSTEMI/PCI: LM nl, LAD min irregs, LCX 50m (2.5x18 Onyx Frontier DES), OM1 nl, RCA 40m (3.5x26 Onyx Frontier DES), 30d.   Diastolic dysfunction    a. 10/2021 Echo: EF 50-55%, no rwma, Gr1 DD, nl RV fxn. No significant valvular dzs.   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 SCCis. MOHs 07/08/22   Past Surgical History:  Procedure Laterality Date   COLONOSCOPY N/A 10/09/2015   Procedure: COLONOSCOPY;  Surgeon: Wallace Cullens, MD;  Location: [facility] ENDOSCOPY;  Service: Gastroenterology;  Laterality: N/A;   CORONARY STENT INTERVENTION N/A 10/22/2021   Procedure: CORONARY STENT INTERVENTION;  Surgeon: Iran Ouch, MD;  Location: [facility] INVASIVE CV LAB;  Service: Cardiovascular;  Laterality: N/A;   IR CT HEAD LTD  05/21/2022   IR PERCUTANEOUS ART THROMBECTOMY/INFUSION INTRACRANIAL INC DIAG ANGIO  05/21/2022   LEFT HEART CATH AND CORONARY ANGIOGRAPHY N/A 10/22/2021   Procedure: LEFT  HEART CATH AND CORONARY ANGIOGRAPHY;  Surgeon: Iran Ouch, MD;  Location: [facility] INVASIVE CV LAB;  Service: Cardiovascular;  Laterality: N/A;   LOOP RECORDER INSERTION N/A 05/23/2022   Procedure: LOOP RECORDER INSERTION;  Surgeon: Lanier Prude, MD;  Location: [facility] INVASIVE CV LAB;  Service: Cardiovascular;  Laterality: N/A;   RADIOLOGY WITH ANESTHESIA N/A 05/20/2022   Procedure: IR WITH ANESTHESIA;  Surgeon: Julieanne Cotton, MD;  Location: [facility] OR;  Service: Radiology;  Laterality: N/A;   ROTATOR CUFF REPAIR     Patient Active Problem List   Diagnosis Date Noted   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 male with PMH: HTN, CAD, HLD, osteoarthritis, SCC, sleep apnea. He presented to the hospital on 05/20/22 with right sided weakness and aphasia with LKW reported by wife to be 10pm on 10/2.  He arrived at [facility] as code stroke and taken for emergent CT/CTA which demonstrated left M1 occlusion. He received IV tenecteplase and IR thrombectomy due to large vessel occlusion.       DIAGNOSTIC FINDINGS:  MRI 05/22/2022 Cortical ischemia throughout the left MCA territory compatible with recent infarct. Relatively little affect on the white matter. 2. Small focus of acute hemorrhage along the anterior surface of the left temporal lobe Heidelberg classification 3c: Subarachnoid hemorrhage. 3. Normal intracranial MRA.  Restored flow in left MCA.    THERAPY DIAG:  Aphasia  Apraxia  Middle cerebral artery embolism, left  Rationale for  Evaluation and Treatment Rehabilitation  SUBJECTIVE:   PAIN:  Are you having pain? No  PATIENT GOALS: to be able to communicate again  SUBJECTIVE STATEMENT: Pt arrived with pre-made sentences in the Text to Speech App about the weekend Pt accompanied by: self and significant other  OBJECTIVE:   TODAY'S TREATMENT: Skilled treatment session focused on pt's communication goals. SLP facilitated session by providing the following interventions:  Maximal assistance to write down subject when provided with verb "______ watched ______". Pt put in "golf, who, my" but was able to generate "I / We" and then "golf" in the second portion.  Maximal multimodal assistance to generate list of TV shows that he likes to watch. Pt with difficulty writing down the words d/t incorrect speech patterns/sounds. Maximal assistance required and education on meed to practice speaking words correctly even though he is able to watch the shows without any help or support.   PATIENT EDUCATION: Education details: see above Person educated: Patient and Wife Education method: Explanation, Facilities manager, Verbal cues, and Handouts Education comprehension: verbalized understanding  HOME EXERCISE PROGRAM: Continue practicing verbal sentences found within Text to Speech App  GOALS: Goals reviewed with patient? Yes  SHORT TERM GOALS: Target date: 10 sessions  UPDATED: 01/20/2023   UPDATED: 02/12/2023 3.   Pt will improve wording finding abilities by writing 3 or more words during communication exchanges to increase listener understanding with moderate A in 5 out of 7 opportunities; ONGOING; progress made 5. Pt will improve spelling ability by accurately spelling SEMI-COMPLEX words during communication exchanges to increase listener understanding with min A in 5 out of 7 opportunities; INITIAL; progress made 6.  Pt will read self-generated words during communication exchanges with > 50% speech intelligibility given Min A  articulatory cues; INITIAL; progress made   LONG TERM GOALS: Target date: 04/14/2023    UPDATED 01/20/2023  Pt will use multimodal communication to express basic wants/needs in 4 of 10 opportunities.  Baseline: unable Goal status: ONGOING; ONGOING    2.  Pt will demonstrate increased reading comprehension by selecting accurate answer from 3 choices in 8 out of 10 opportunities.  Goal status: ONGOING; MET (01/20/2023)  ASSESSMENT:  CLINICAL IMPRESSION: Pt is a 77 year old male who was seen today for therapy targeting his aphasia. Pt with improved communication using text to speech app. Increased speech intelligibility when using Text to Speech app today for VNEST. See the above treatment note for details.   OBJECTIVE IMPAIRMENTS include expressive language, receptive language, aphasia, and apraxia. These impairments are limiting patient from managing medications, managing appointments, managing finances, household responsibilities, ADLs/IADLs, and effectively communicating at home and in community. Factors affecting potential to achieve goals and functional outcome are severity of impairments. Patient will benefit from skilled SLP services to address above impairments and improve overall function.  REHAB POTENTIAL: Excellent  PLAN: SLP FREQUENCY: 3x/week  SLP DURATION: 12 weeks  PLANNED INTERVENTIONS: Language facilitation, Environmental controls, Internal/external aids, Functional tasks, Multimodal communication approach, SLP instruction and feedback, Compensatory strategies, and Patient/family education    Athleen Feltner B. Dreama Saa, M.S., CCC-SLP, Tree surgeon Certified Brain Injury Specialist [facility]  [facility] Rehabilitation Services Office [phone] Ascom [phone] Fax [phone]

## 2023-04-09 ENCOUNTER — Ambulatory Visit: Payer: Medicare HMO | Admitting: Speech Pathology

## 2023-04-09 ENCOUNTER — Other Ambulatory Visit: Payer: Self-pay | Admitting: Cardiovascular Disease

## 2023-04-09 NOTE — Telephone Encounter (Signed)
Please contact pt for future appointment. Pt overdue for 6 month f/u.  

## 2023-04-11 ENCOUNTER — Ambulatory Visit: Payer: Medicare HMO | Admitting: Speech Pathology

## 2023-04-11 DIAGNOSIS — I6602 Occlusion and stenosis of left middle cerebral artery: Secondary | ICD-10-CM

## 2023-04-11 DIAGNOSIS — R4701 Aphasia: Secondary | ICD-10-CM | POA: Diagnosis not present

## 2023-04-11 DIAGNOSIS — R482 Apraxia: Secondary | ICD-10-CM | POA: Diagnosis not present

## 2023-04-11 NOTE — Therapy (Unsigned)
OUTPATIENT SPEECH LANGUAGE PATHOLOGY TREATMENT NOTE    Patient Name: Alan Stewart MRN: [id number] DOB:04-14-1946, 77 y.o., male Today's Date: 04/11/2023    PCP: Daniel Nones, MD REFERRING PROVIDER: Daniel Nones, MD   END OF SESSION  End of Session - 04/11/23 1156     Visit Number 108    Number of Visits 117    Date for SLP Re-Evaluation 04/14/23    Authorization Type [employer] H[employer]    Authorization Time Period 03/21/2023 thru 05/16/2023    Authorization - Visit Number 6    Authorization - Number of Visits 16    Progress Note Due on Visit 110    SLP Start Time 1015    SLP Stop Time  1100    SLP Time Calculation (min) 45 min    Activity Tolerance Patient tolerated treatment well                   Past Medical History:  Diagnosis Date   CAD (coronary artery disease)    a. 10/2021 NSTEMI/PCI: LM nl, LAD min irregs, LCX 22m (2.5x18 Onyx Frontier DES), OM1 nl, RCA 77m (3.5x26 Onyx Frontier DES), 30d.   Diastolic dysfunction    a. 10/2021 Echo: EF 50-55%, no rwma, Gr1 DD, nl RV fxn. No significant valvular dzs.   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 SCCis. MOHs 07/08/22   Past Surgical History:  Procedure Laterality Date   COLONOSCOPY N/A 10/09/2015   Procedure: COLONOSCOPY;  Surgeon: Wallace Cullens, MD;  Location[facility] ENDOSCOPY;  Service: Gastroenterology;  Laterality: N/A;   CORONARY STENT INTERVENTION N/A 10/22/2021   Procedure: CORONARY STENT INTERVENTION;  Surgeon: Iran Ouch, MD;  Location: [facility] INVASIVE CV LAB;  Service: Cardiovascular;  Laterality: N/A;   IR CT HEAD LTD  05/21/2022   IR PERCUTANEOUS ART THROMBECTOMY/INFUSION INTRACRANIAL INC DIAG ANGIO  05/21/2022   LEFT HEART CATH AND CORONARY ANGIOGRAPHY N/A 10/22/2021   Procedure: LEFT  HEART CATH AND CORONARY ANGIOGRAPHY;  Surgeon: Iran Ouch, MD;  Location: [facility] INVASIVE CV LAB;  Service: Cardiovascular;  Laterality: N/A;   LOOP RECORDER INSERTION N/A 05/23/2022   Procedure: LOOP RECORDER INSERTION;  Surgeon: Lanier Prude, MD;  Location: [facility] INVASIVE CV LAB;  Service: Cardiovascular;  Laterality: N/A;   RADIOLOGY WITH ANESTHESIA N/A 05/20/2022   Procedure: IR WITH ANESTHESIA;  Surgeon: Julieanne Cotton, MD;  Location: [facility] OR;  Service: Radiology;  Laterality: N/A;   ROTATOR CUFF REPAIR     Patient Active Problem List   Diagnosis Date Noted   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 male with PMH: HTN, CAD, HLD, osteoarthritis, SCC, sleep apnea. He presented to the hospital on 05/20/22 with right sided weakness and aphasia with LKW reported by wife to be 10pm on 10/2.  He arrived at [facility] as code stroke and taken for emergent CT/CTA which demonstrated left M1 occlusion. He received IV tenecteplase and IR thrombectomy due to large vessel occlusion.       DIAGNOSTIC FINDINGS:  MRI 05/22/2022 Cortical ischemia throughout the left MCA territory compatible with recent infarct. Relatively little affect on the white matter. 2. Small focus of acute hemorrhage along the anterior surface of the left temporal lobe Heidelberg classification 3c: Subarachnoid hemorrhage. 3. Normal intracranial MRA.  Restored flow in left MCA.    THERAPY DIAG:  Aphasia  Apraxia  Middle cerebral artery embolism, left  Rationale for  Evaluation and Treatment Rehabilitation  SUBJECTIVE:   PAIN:  Are you having pain? No  PATIENT GOALS: to be able to communicate again  SUBJECTIVE STATEMENT: Pt arrived with pre-made sentences in the Text to Speech App about the weekend Pt accompanied by: self and significant other  OBJECTIVE:   TODAY'S TREATMENT: Skilled treatment session focused on pt's communication goals. SLP facilitated session by providing the following interventions:  Moderate assistance to write down subject when provided with verb "______ watched ______". Pt put in "golf, who, my" but was able to generate "I / We" and then "golf" in the second portion.  Moderate multimodal assistance to generate list of TV shows that he likes to watch. Pt with difficulty writing down the words d/t incorrect speech patterns/sounds. Maximal assistance required and education on need to practice speaking words correctly even though he is able to watch the shows without any help or support.   PATIENT EDUCATION: Education details: see above Person educated: Patient and Wife Education method: Explanation, Facilities manager, Verbal cues, and Handouts Education comprehension: verbalized understanding  HOME EXERCISE PROGRAM: Continue practicing verbal sentences found within Text to Speech App  GOALS: Goals reviewed with patient? Yes  SHORT TERM GOALS: Target date: 10 sessions  UPDATED: 01/20/2023   UPDATED: 02/12/2023 3.   Pt will improve wording finding abilities by writing 3 or more words during communication exchanges to increase listener understanding with moderate A in 5 out of 7 opportunities; ONGOING; progress made 5. Pt will improve spelling ability by accurately spelling SEMI-COMPLEX words during communication exchanges to increase listener understanding with min A in 5 out of 7 opportunities; INITIAL; progress made 6.  Pt will read self-generated words during communication exchanges with > 50% speech intelligibility given Min  A articulatory cues; INITIAL; progress made   LONG TERM GOALS: Target date: 04/14/2023    UPDATED 01/20/2023  Pt will use multimodal communication to express basic wants/needs in 4 of 10 opportunities.  Baseline: unable Goal status: ONGOING; ONGOING    2.  Pt will demonstrate increased reading comprehension by selecting accurate answer from 3 choices in 8 out of 10 opportunities.  Goal status: ONGOING; MET (01/20/2023)  ASSESSMENT:  CLINICAL IMPRESSION: Pt is a 77 year old male who was seen today for therapy targeting his aphasia. Pt with improved communication using text to speech app. Increased speech intelligibility when using Text to Speech app today for VNEST. See the above treatment note for details.   OBJECTIVE IMPAIRMENTS include expressive language, receptive language, aphasia, and apraxia. These impairments are limiting patient from managing medications, managing appointments, managing finances, household responsibilities, ADLs/IADLs, and effectively communicating at home and in community. Factors affecting potential to achieve goals and functional outcome are severity of impairments. Patient will benefit from skilled SLP services to address above impairments and improve overall function.  REHAB POTENTIAL: Excellent  PLAN: SLP FREQUENCY: 3x/week  SLP DURATION: 12 weeks  PLANNED INTERVENTIONS: Language facilitation, Environmental controls, Internal/external aids, Functional tasks, Multimodal communication approach, SLP instruction and feedback, Compensatory strategies, and Patient/family education    Elese Rane B. Dreama Saa, M.S., CCC-SLP, Tree surgeon Certified Brain Injury Specialist [facility]  [facility] Rehabilitation Services Office [phone] Ascom [phone] Fax [phone]

## 2023-04-14 ENCOUNTER — Ambulatory Visit: Payer: Medicare HMO | Admitting: Speech Pathology

## 2023-04-14 DIAGNOSIS — I6602 Occlusion and stenosis of left middle cerebral artery: Secondary | ICD-10-CM | POA: Diagnosis not present

## 2023-04-14 DIAGNOSIS — R4701 Aphasia: Secondary | ICD-10-CM

## 2023-04-14 DIAGNOSIS — R482 Apraxia: Secondary | ICD-10-CM | POA: Diagnosis not present

## 2023-04-14 NOTE — Progress Notes (Signed)
[employer] Loop Recorder 

## 2023-04-14 NOTE — Therapy (Signed)
OUTPATIENT SPEECH LANGUAGE PATHOLOGY TREATMENT NOTE    Patient Name: Alan Stewart MRN: [id number] DOB:1946/06/15, 77 y.o., male Today's Date: 04/14/2023    PCP: Daniel Nones, MD REFERRING PROVIDER: Daniel Nones, MD   END OF SESSION  End of Session - 04/14/23 1016     Visit Number 109    Number of Visits 117    Date for SLP Re-Evaluation 04/14/23    Authorization Type [employer] H[employer]    Authorization Time Period 03/21/2023 thru 05/16/2023    Authorization - Visit Number 7    Authorization - Number of Visits 16    Progress Note Due on Visit 110    SLP Start Time 1015    SLP Stop Time  1100    SLP Time Calculation (min) 45 min    Activity Tolerance Patient tolerated treatment well                   Past Medical History:  Diagnosis Date   CAD (coronary artery disease)    a. 10/2021 NSTEMI/PCI: LM nl, LAD min irregs, LCX 56m (2.5x18 Onyx Frontier DES), OM1 nl, RCA 27m (3.5x26 Onyx Frontier DES), 30d.   Diastolic dysfunction    a. 10/2021 Echo: EF 50-55%, no rwma, Gr1 DD, nl RV fxn. No significant valvular dzs.   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 SCCis. MOHs 07/08/22   Past Surgical History:  Procedure Laterality Date   COLONOSCOPY N/A 10/09/2015   Procedure: COLONOSCOPY;  Surgeon: Wallace Cullens, MD;  Location: [facility] ENDOSCOPY;  Service: Gastroenterology;  Laterality: N/A;   CORONARY STENT INTERVENTION N/A 10/22/2021   Procedure: CORONARY STENT INTERVENTION;  Surgeon: Iran Ouch, MD;  Location: [facility] INVASIVE CV LAB;  Service: Cardiovascular;  Laterality: N/A;   IR CT HEAD LTD  05/21/2022   IR PERCUTANEOUS ART THROMBECTOMY/INFUSION INTRACRANIAL INC DIAG ANGIO  05/21/2022   LEFT HEART CATH AND CORONARY ANGIOGRAPHY N/A 10/22/2021   Procedure: LEFT  HEART CATH AND CORONARY ANGIOGRAPHY;  Surgeon: Iran Ouch, MD;  Location: [facility] INVASIVE CV LAB;  Service: Cardiovascular;  Laterality: N/A;   LOOP RECORDER INSERTION N/A 05/23/2022   Procedure: LOOP RECORDER INSERTION;  Surgeon: Lanier Prude, MD;  Location: [facility] INVASIVE CV LAB;  Service: Cardiovascular;  Laterality: N/A;   RADIOLOGY WITH ANESTHESIA N/A 05/20/2022   Procedure: IR WITH ANESTHESIA;  Surgeon: Julieanne Cotton, MD;  Location: [facility] OR;  Service: Radiology;  Laterality: N/A;   ROTATOR CUFF REPAIR     Patient Active Problem List   Diagnosis Date Noted   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 male with PMH: HTN, CAD, HLD, osteoarthritis, SCC, sleep apnea. He presented to the hospital on 05/20/22 with right sided weakness and aphasia with LKW reported by wife to be 10pm on 10/2.  He arrived at [facility] as code stroke and taken for emergent CT/CTA which demonstrated left M1 occlusion. He received IV tenecteplase and IR thrombectomy due to large vessel occlusion.       DIAGNOSTIC FINDINGS:  MRI 05/22/2022 Cortical ischemia throughout the left MCA territory compatible with recent infarct. Relatively little affect on the white matter. 2. Small focus of acute hemorrhage along the anterior surface of the left temporal lobe Heidelberg classification 3c: Subarachnoid hemorrhage. 3. Normal intracranial MRA.  Restored flow in left MCA.    THERAPY DIAG:  Aphasia  Apraxia  Middle cerebral artery embolism, left  Rationale for  Evaluation and Treatment Rehabilitation  SUBJECTIVE:   PAIN:  Are you having pain? No  PATIENT GOALS: to be able to communicate again  SUBJECTIVE STATEMENT: Pt continues to express that "he can" indicating increased physical independence and no need to communicate  Pt accompanied by: self and significant other  OBJECTIVE:   TODAY'S TREATMENT: Skilled treatment session focused on pt's communication goals. SLP facilitated session by providing the following interventions:  Maximal multimodal assistance required to use Text to Speech App to practice motor patterns for functional appropriate phrases. Pt is able to achieve improved speech intelligibility with maximal assistance and he feels he is stating all the words when in actuality, he is stating only one word - for example "What's for dinner?" Pt only stated "dinner" but thought he was producing the entire utterance. He continues with decreased practice of phrases because "I can do it" referring to being able to go outside and mow. Or point to items on a menu without saying them. She wife voiced frustration with pt's decreased desire to talk and communication. SLP further attempted to facilitate awareness of need to communicate by presenting various scenarios in which pt would need to communicate (if his wife was in the hospital). Pt wrote down his children's names "they take me" and he was not able to understand a potential need to communicate his wife's wishes. Pt with response to each scenario indicating decreased insight into communication demands of each situation.    PATIENT EDUCATION: Education details: see above Person educated: Patient and Wife Education method: Explanation, Facilities manager, Verbal cues, and Handouts Education comprehension: verbalized understanding  HOME EXERCISE PROGRAM: Continue practicing verbal sentences found within Text to Speech App  GOALS: Goals reviewed with patient? Yes  SHORT TERM GOALS: Target  date: 10 sessions  UPDATED: 01/20/2023   UPDATED: 02/12/2023 3.   Pt will improve wording finding abilities by writing 3 or more words during communication exchanges to increase listener understanding with moderate A in 5 out of 7 opportunities; ONGOING; progress made 5. Pt will improve spelling ability by accurately spelling SEMI-COMPLEX words during communication exchanges to increase listener understanding with min A in 5 out of 7 opportunities; INITIAL; progress made 6.  Pt will read self-generated words during communication exchanges with > 50% speech intelligibility given Min A articulatory cues; INITIAL; progress made   LONG TERM GOALS: Target date: 04/14/2023    UPDATED 01/20/2023  Pt will use multimodal communication to express basic wants/needs in 4 of 10 opportunities.  Baseline: unable Goal status: ONGOING; ONGOING    2.  Pt will demonstrate increased reading comprehension by selecting accurate answer from 3 choices in 8 out of 10 opportunities.  Goal status: ONGOING; MET (01/20/2023)  ASSESSMENT:  CLINICAL IMPRESSION: Pt is a 77 year old male who was seen today for therapy targeting his aphasia. Pt with improved communication using text to  speech app. Pt limited by decreased insight into communication demands and impairments. See the above treatment note for details.   OBJECTIVE IMPAIRMENTS include expressive language, receptive language, aphasia, and apraxia. These impairments are limiting patient from managing medications, managing appointments, managing finances, household responsibilities, ADLs/IADLs, and effectively communicating at home and in community. Factors affecting potential to achieve goals and functional outcome are severity of impairments. Patient will benefit from skilled SLP services to address above impairments and improve overall function.  REHAB POTENTIAL: Excellent  PLAN: SLP FREQUENCY: 3x/week  SLP DURATION: 12 weeks  PLANNED INTERVENTIONS:  Language facilitation, Environmental controls, Internal/external aids, Functional tasks, Multimodal communication approach, SLP instruction and feedback, Compensatory strategies, and Patient/family education    Sofya Moustafa B. Dreama Saa, M.S., CCC-SLP, Tree surgeon Certified Brain Injury Specialist [facility]  [facility] Rehabilitation Services Office [phone] Ascom [phone] Fax [phone]

## 2023-04-16 ENCOUNTER — Ambulatory Visit: Payer: Medicare HMO | Admitting: Speech Pathology

## 2023-04-16 DIAGNOSIS — R4701 Aphasia: Secondary | ICD-10-CM | POA: Diagnosis not present

## 2023-04-16 DIAGNOSIS — I6602 Occlusion and stenosis of left middle cerebral artery: Secondary | ICD-10-CM

## 2023-04-16 DIAGNOSIS — R482 Apraxia: Secondary | ICD-10-CM | POA: Diagnosis not present

## 2023-04-16 NOTE — Therapy (Signed)
OUTPATIENT SPEECH LANGUAGE PATHOLOGY TREATMENT NOTE 10th VISIT PROGRESS NOTE RE-CERTIFICATION REQUEST    Patient Name: Alan Stewart MRN: [id number] DOB:01/20/1946, 77 y.o., male Today's Date: 04/16/2023  PCP: Alan Nones, MD REFERRING PROVIDER: Daniel Nones, MD  Speech Therapy Progress Note  Dates of Reporting Period: 03/12/2023 to 04/16/2023  Objective: Patient has been seen for 10 speech therapy sessions (3xweek) this reporting period targeting his aphasia. Pt has struggled with carrying over strategies and initiating communication, therefore he made slower than anticipated progress during the last 10 sessions.  See skilled intervention, clinical impressions, and goals below for details.    END OF SESSION  End of Session - 04/16/23 1016     Visit Number 110    Number of Visits 134    Date for SLP Re-Evaluation 06/11/23    Authorization Type [employer] H[employer]    Authorization Time Period 03/21/2023 thru 05/16/2023    Authorization - Visit Number 8    Authorization - Number of Visits 16    Progress Note Due on Visit 110    SLP Start Time 1015    SLP Stop Time  1100    SLP Time Calculation (min) 45 min    Activity Tolerance Patient tolerated treatment well                   Past Medical History:  Diagnosis Date   CAD (coronary artery disease)    a. 10/2021 NSTEMI/PCI: LM nl, LAD min irregs, LCX 69m (2.5x18 Onyx Frontier DES), OM1 nl, RCA 33m (3.5x26 Onyx Frontier DES), 30d.   Diastolic dysfunction    a. 10/2021 Echo: EF 50-55%, no rwma, Gr1 DD, nl RV fxn. No significant valvular dzs.   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 SCCis. MOHs 07/08/22   Past Surgical History:  Procedure Laterality Date   COLONOSCOPY N/A 10/09/2015   Procedure:  COLONOSCOPY;  Surgeon: Alan Cullens, MD;  Location: [facility] ENDOSCOPY;  Service: Gastroenterology;  Laterality: N/A;   CORONARY STENT INTERVENTION N/A 10/22/2021   Procedure: CORONARY STENT INTERVENTION;  Surgeon: Alan Ouch, MD;  Location: [facility] INVASIVE CV LAB;  Service: Cardiovascular;  Laterality: N/A;   IR CT HEAD LTD  05/21/2022   IR PERCUTANEOUS ART THROMBECTOMY/INFUSION INTRACRANIAL INC DIAG ANGIO  05/21/2022   LEFT HEART CATH AND CORONARY ANGIOGRAPHY N/A 10/22/2021   Procedure: LEFT HEART CATH AND CORONARY ANGIOGRAPHY;  Surgeon: Alan Ouch, MD;  Location: [facility] INVASIVE CV LAB;  Service: Cardiovascular;  Laterality: N/A;   LOOP RECORDER INSERTION N/A 05/23/2022   Procedure: LOOP RECORDER INSERTION;  Surgeon: Alan Prude, MD;  Location: [facility] INVASIVE CV LAB;  Service: Cardiovascular;  Laterality: N/A;   RADIOLOGY WITH ANESTHESIA N/A 05/20/2022   Procedure: IR WITH ANESTHESIA;  Surgeon: Alan Cotton, MD;  Location: [facility] OR;  Service: Radiology;  Laterality: N/A;   ROTATOR CUFF REPAIR     Patient Active Problem List   Diagnosis Date Noted   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 male with PMH: HTN, CAD, HLD, osteoarthritis, SCC, sleep apnea. He presented to the hospital on 05/20/22 with right sided weakness and aphasia with LKW reported by wife to be 10pm on 10/2.  He arrived at [facility] as code stroke and taken for emergent CT/CTA which demonstrated left M1 occlusion. He received IV tenecteplase and IR thrombectomy due to large  vessel occlusion.       DIAGNOSTIC FINDINGS:  MRI 05/22/2022 Cortical ischemia throughout the left MCA territory compatible with recent infarct. Relatively little affect on the white matter. 2. Small focus of acute hemorrhage along the anterior surface of the left temporal lobe Alan Stewart classification 3c: Subarachnoid hemorrhage. 3. Normal intracranial MRA.  Restored flow in left MCA.    THERAPY DIAG:  Aphasia  Apraxia  Middle cerebral artery embolism, left  Rationale for Evaluation and Treatment Rehabilitation  SUBJECTIVE:   PAIN:  Are you having pain? No  PATIENT GOALS: to be able to communicate again  SUBJECTIVE STATEMENT: His wife reports that with heavy cues from her "he got out his phone and practiced some this morning" Pt accompanied by: self and significant other  OBJECTIVE:   TODAY'S TREATMENT: Skilled treatment session focused on pt's communication goals. SLP facilitated session by providing the following interventions:  In an effort to gather more information about pt's communication abilities or initiation of communication prior to the CVA, SLP provided motivational interviewing  At baseline, pt would not ask his wife about her day upon returning form work  Before stroke - pt didn't talk to his children on regular basis but he play golf every Sunday with his son [name] - Recommend he call his son to schedule weekly golfing trips Overall pt had little to no initiation of verbal communication before stroke  Recommend going to watch movies, putt putt, restaurants, parks, also recommend developing scripts with sessions to facilitate conversations with his children or conversations regarding golf but pt not very interested, his wife to call his children to get more involvement    It has also been observed in these sessions that pt is having more difficulty selecting items on his phone such as the PGA section on [employer] d/t likely lack of use    PATIENT  EDUCATION: Education details: see above Person educated: Patient and Wife Education method: Programmer, multimedia, Facilities manager, Verbal cues, and Handouts Education comprehension: verbalized understanding  HOME EXERCISE PROGRAM: Continue practicing verbal sentences found within Text to Speech App  GOALS: Goals reviewed with patient? Yes  SHORT TERM GOALS: Target date: 10 sessions   UPDATED: 04/16/2023 3.   Pt will improve wording finding abilities by writing 3 or more words during communication exchanges to increase listener understanding with moderate A in 5 out of 7 opportunities; ONGOING;  5. Pt will improve spelling ability by accurately spelling SEMI-COMPLEX words during communication exchanges to increase listener understanding with min A in 5 out of 7 opportunities; ONGOING 6.  Pt will read self-generated words during communication exchanges with > 50% speech intelligibility given Min A articulatory cues; ONGOING  LONG TERM GOALS: Target date: 06/11/2023     Pt will use multimodal communication to express basic wants/needs in 4 of 10 opportunities.  Baseline: unable Goal status: ONGOING 2.  Pt will initial communication in 3 of 5 opportunities as reported by  caregiver outside of ST sessions.   Baseline: no initiation  Goal Status: Initial     ASSESSMENT:  CLINICAL IMPRESSION: Pt is a 77 year old male who was seen today for therapy targeting his aphasia. Pt with improved communication using text to speech app. Pt limited by decreased insight into communication demands and impairments. Will request re-certification for attempts at getting pt out into community more to motivate communication use. See the above treatment note for details.   OBJECTIVE IMPAIRMENTS include expressive language, receptive language, aphasia, and apraxia. These impairments are limiting patient from managing medications, managing appointments, managing finances, household responsibilities, ADLs/IADLs, and  effectively communicating at home and in community. Factors affecting potential to achieve goals and functional outcome are severity of impairments. Patient will benefit from skilled SLP services to address above impairments and improve overall function.  REHAB POTENTIAL: Excellent  PLAN: SLP FREQUENCY: 3x/week  SLP DURATION: 12 weeks  PLANNED INTERVENTIONS: Language facilitation, Environmental controls, Internal/external aids, Functional tasks, Multimodal communication approach, SLP instruction and feedback, Compensatory strategies, and Patient/family education    Si Jachim B. Dreama Saa, M.S., CCC-SLP, Tree surgeon Certified Brain Injury Specialist [facility]  [facility] Rehabilitation Services Office [phone] Ascom [phone] Fax [phone]

## 2023-04-18 ENCOUNTER — Ambulatory Visit: Payer: Medicare HMO | Admitting: Speech Pathology

## 2023-04-23 ENCOUNTER — Ambulatory Visit: Payer: Medicare HMO | Attending: Internal Medicine | Admitting: Speech Pathology

## 2023-04-23 ENCOUNTER — Ambulatory Visit: Payer: Medicare HMO | Admitting: Speech Pathology

## 2023-04-23 DIAGNOSIS — R4701 Aphasia: Secondary | ICD-10-CM

## 2023-04-23 DIAGNOSIS — I6602 Occlusion and stenosis of left middle cerebral artery: Secondary | ICD-10-CM | POA: Diagnosis not present

## 2023-04-23 DIAGNOSIS — R482 Apraxia: Secondary | ICD-10-CM | POA: Diagnosis not present

## 2023-04-23 NOTE — Therapy (Signed)
OUTPATIENT SPEECH LANGUAGE PATHOLOGY TREATMENT NOTE   Patient Name: Alan Stewart MRN: [id number] DOB:11/17/1945, 77 y.o., male Today's Date: 04/23/2023  PCP: Daniel Nones, MD REFERRING PROVIDER: Daniel Nones, MD   END OF SESSION  End of Session - 04/23/23 1032     Visit Number 111    Number of Visits 134    Date for SLP Re-Evaluation 06/11/23    Authorization Type [employer] H[employer]    Authorization Time Period 03/21/2023 thru 05/16/2023    Authorization - Visit Number 9    Authorization - Number of Visits 16    Progress Note Due on Visit 120    SLP Start Time 1015    SLP Stop Time  1100    SLP Time Calculation (min) 45 min    Activity Tolerance Patient tolerated treatment well                   Past Medical History:  Diagnosis Date   CAD (coronary artery disease)    a. 10/2021 NSTEMI/PCI: LM nl, LAD min irregs, LCX 64m (2.5x18 Onyx Frontier DES), OM1 nl, RCA 50m (3.5x26 Onyx Frontier DES), 30d.   Diastolic dysfunction    a. 10/2021 Echo: EF 50-55%, no rwma, Gr1 DD, nl RV fxn. No significant valvular dzs.   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 SCCis. MOHs 07/08/22   Past Surgical History:  Procedure Laterality Date   COLONOSCOPY N/A 10/09/2015   Procedure: COLONOSCOPY;  Surgeon: Wallace Cullens, MD;  [facility] ENDOSCOPY;  Service: Gastroenterology;  Laterality: N/A;   CORONARY STENT INTERVENTION N/A 10/22/2021   Procedure: CORONARY STENT INTERVENTION;  Surgeon: Iran Ouch, MD;  Location: [facility] INVASIVE CV LAB;  Service: Cardiovascular;  Laterality: N/A;   IR CT HEAD LTD  05/21/2022   IR PERCUTANEOUS ART THROMBECTOMY/INFUSION INTRACRANIAL INC DIAG ANGIO  05/21/2022   LEFT HEART CATH AND CORONARY ANGIOGRAPHY N/A 10/22/2021   Procedure: LEFT HEART CATH  AND CORONARY ANGIOGRAPHY;  Surgeon: Iran Ouch, MD;  Location: [facility] INVASIVE CV LAB;  Service: Cardiovascular;  Laterality: N/A;   LOOP RECORDER INSERTION N/A 05/23/2022   Procedure: LOOP RECORDER INSERTION;  Surgeon: Lanier Prude, MD;  Location: [facility] INVASIVE CV LAB;  Service: Cardiovascular;  Laterality: N/A;   RADIOLOGY WITH ANESTHESIA N/A 05/20/2022   Procedure: IR WITH ANESTHESIA;  Surgeon: Julieanne Cotton, MD;  Location: [facility] OR;  Service: Radiology;  Laterality: N/A;   ROTATOR CUFF REPAIR     Patient Active Problem List   Diagnosis Date Noted   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 male with PMH: HTN, CAD, HLD, osteoarthritis, SCC, sleep apnea. He presented to the hospital on 05/20/22 with right sided weakness and aphasia with LKW reported by wife to be 10pm on 10/2.  He arrived  at [facility] as code stroke and taken for emergent CT/CTA which demonstrated left M1 occlusion. He received IV tenecteplase and IR thrombectomy due to large vessel occlusion.       DIAGNOSTIC FINDINGS:  MRI 05/22/2022 Cortical ischemia throughout the left MCA territory compatible with recent infarct. Relatively little affect on the white matter. 2. Small focus of acute hemorrhage along the anterior surface of the left temporal lobe Heidelberg classification 3c: Subarachnoid hemorrhage. 3. Normal intracranial MRA.  Restored flow in left MCA.    THERAPY DIAG:  Aphasia  Apraxia  Middle cerebral artery embolism, left  Rationale for Evaluation  and Treatment Rehabilitation  SUBJECTIVE:   PAIN:  Are you having pain? No  PATIENT GOALS: to be able to communicate again  SUBJECTIVE STATEMENT: Pt's wife reports that she reached out to pt's children but his daughter canceled on them Pt accompanied by: self and significant other  OBJECTIVE:   TODAY'S TREATMENT: Skilled treatment session focused on pt's communication goals. SLP facilitated session by providing the following interventions:  In an effort to build pt's awareness of communication deficits, SLP provided pt with verbal functional scenarios such as placing in order at a restaurant, entering his number when checking out at grocery store and getting back change when presenting $5 - pt unable to use Text to Speech, written (paper and pencil) or verbal communication to communicate his wishes. Prior to each scenario, he suggested that he would be able to express himself. Uncertain if pt understood the connection between practicing language at home and potential improved abilities in the above situations. When his wife suggested that they go to the driving range to hit some balls, SLP suggested pt communicate the number of balls that he wanted and attempt to pay for some assessment of his ability. To which pt commented "I don't want then."  PATIENT EDUCATION: Education details: see above Person educated: Patient and Wife Education method: Explanation, Demonstration, Verbal cues, and Handouts Education comprehension: verbalized understanding  HOME EXERCISE PROGRAM: Continue practicing verbal sentences found within Text to Speech App  GOALS: Goals reviewed with patient? Yes  SHORT TERM GOALS: Target date: 10 sessions   UPDATED: 04/16/2023 3.   Pt will improve wording finding abilities by writing 3 or more words during communication exchanges to increase listener understanding with moderate A in 5 out of 7 opportunities; ONGOING;  5. Pt will improve spelling ability by accurately  spelling SEMI-COMPLEX words during communication exchanges to increase listener understanding with min A in 5 out of 7 opportunities; ONGOING 6.  Pt will read self-generated words during communication exchanges with > 50% speech intelligibility given Min A articulatory cues; ONGOING  LONG TERM GOALS: Target date: 06/11/2023     Pt will use multimodal communication to express basic wants/needs in 4 of 10 opportunities.  Baseline: unable Goal status: ONGOING 2.  Pt will initial communication in 3 of 5 opportunities as reported by caregiver outside of ST sessions.   Baseline: no initiation  Goal Status: Initial     ASSESSMENT:  CLINICAL IMPRESSION: Pt is a 77 year old male who was seen today for therapy targeting his aphasia. Pt with improved communication using text to speech app. Pt limited by decreased insight into communication demands and impairments. Pt didn't appear motivated to communicate despite struggles in today's activities aimed at improving communicative independence. See the above treatment note for details.   OBJECTIVE IMPAIRMENTS include expressive language, receptive language, aphasia, and apraxia. These impairments are limiting patient from managing  medications, managing appointments, managing finances, household responsibilities, ADLs/IADLs, and effectively communicating at home and in community. Factors affecting potential to achieve goals and functional outcome are severity of impairments. Patient will benefit from skilled SLP services to address above impairments and improve overall function.  REHAB POTENTIAL: Excellent  PLAN: SLP FREQUENCY: 3x/week  SLP DURATION: 12 weeks  PLANNED INTERVENTIONS: Language facilitation, Environmental controls, Internal/external aids, Functional tasks, Multimodal communication approach, SLP instruction and feedback, Compensatory strategies, and Patient/family education    Pharrah Rottman B. Dreama Saa, M.S., CCC-SLP, Research officer, trade union Certified Brain Injury Specialist [facility]  [facility] Rehabilitation Services Office [phone] Ascom [phone] Fax [phone]

## 2023-04-25 ENCOUNTER — Ambulatory Visit: Payer: Medicare HMO | Admitting: Speech Pathology

## 2023-04-25 DIAGNOSIS — R4701 Aphasia: Secondary | ICD-10-CM

## 2023-04-25 DIAGNOSIS — I6602 Occlusion and stenosis of left middle cerebral artery: Secondary | ICD-10-CM | POA: Diagnosis not present

## 2023-04-25 DIAGNOSIS — R482 Apraxia: Secondary | ICD-10-CM | POA: Diagnosis not present

## 2023-04-25 NOTE — Therapy (Signed)
OUTPATIENT SPEECH LANGUAGE PATHOLOGY TREATMENT NOTE   Patient Name: Alan Stewart MRN: [id number] DOB:[birth year], 77 y.o., male Today's Date: 04/25/2023  PCP: Daniel Nones, MD REFERRING PROVIDER: Daniel Nones, MD   END OF SESSION  End of Session - 04/25/23 1020     Visit Number 112    Number of Visits 134    Date for SLP Re-Evaluation 06/11/23    Authorization Type [employer] H[employer]    Authorization Time Period 03/21/2023 thru 05/16/2023    Authorization - Visit Number 10    Authorization - Number of Visits 16    Progress Note Due on Visit 120    SLP Start Time 1015    SLP Stop Time  1100    SLP Time Calculation (min) 45 min    Activity Tolerance Patient tolerated treatment well                   Past Medical History:  Diagnosis Date   CAD (coronary artery disease)    a. 10/2021 NSTEMI/PCI: LM nl, LAD min irregs, LCX 8m (2.5x18 Onyx Frontier DES), OM1 nl, RCA 32m (3.5x26 Onyx Frontier DES), 30d.   Diastolic dysfunction    a. 10/2021 Echo: EF 50-55%, no rwma, Gr1 DD, nl RV fxn. No significant valvular dzs.   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 SCCis. MOHs 07/08/22   Past Surgical History:  Procedure Laterality Date   COLONOSCOPY N/A 10/09/2015   Procedure: COLONOSCOPY;  Surgeon: Wallace Cullens, MD;  Location: [facility] ENDOSCOPY;  Service: Gastroenterology;  Laterality: N/A;   CORONARY STENT INTERVENTION N/A 10/22/2021   Procedure: CORONARY STENT INTERVENTION;  Surgeon: Iran Ouch, MD;  Location: [facility] INVASIVE CV LAB;  Service: Cardiovascular;  Laterality: N/A;   IR CT HEAD LTD  05/21/2022   IR PERCUTANEOUS ART THROMBECTOMY/INFUSION INTRACRANIAL INC DIAG ANGIO  05/21/2022   LEFT HEART CATH AND CORONARY ANGIOGRAPHY N/A 10/22/2021   Procedure: LEFT HEART  CATH AND CORONARY ANGIOGRAPHY;  Surgeon: Iran Ouch, MD;  Location: [facility] INVASIVE CV LAB;  Service: Cardiovascular;  Laterality: N/A;   LOOP RECORDER INSERTION N/A 05/23/2022   Procedure: LOOP RECORDER INSERTION;  Surgeon: Lanier Prude, MD;  Location: [facility] INVASIVE CV LAB;  Service: Cardiovascular;  Laterality: N/A;   RADIOLOGY WITH ANESTHESIA N/A 05/20/2022   Procedure: IR WITH ANESTHESIA;  Surgeon: Julieanne Cotton, MD;  Location: [facility] OR;  Service: Radiology;  Laterality: N/A;   ROTATOR CUFF REPAIR     Patient Active Problem List   Diagnosis Date Noted   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 male with PMH: HTN, CAD, HLD, osteoarthritis, SCC, sleep apnea. He presented to the hospital on 05/20/22 with right sided weakness and aphasia with LKW reported by wife to be 10pm on 10/2.  He arrived  at [facility] as code stroke and taken for emergent CT/CTA which demonstrated left M1 occlusion. He received IV tenecteplase and IR thrombectomy due to large vessel occlusion.       DIAGNOSTIC FINDINGS:  MRI 05/22/2022 Cortical ischemia throughout the left MCA territory compatible with recent infarct. Relatively little affect on the white matter. 2. Small focus of acute hemorrhage along the anterior surface of the left temporal lobe Heidelberg classification 3c: Subarachnoid hemorrhage. 3. Normal intracranial MRA.  Restored flow in left MCA.    THERAPY DIAG:  Aphasia  Apraxia  Middle cerebral artery embolism, left  Rationale for  Evaluation and Treatment Rehabilitation  SUBJECTIVE:   PAIN:  Are you having pain? No  PATIENT GOALS: to be able to communicate again  SUBJECTIVE STATEMENT: His wife reported "he has been working really hard" Pt accompanied by: self and significant other  OBJECTIVE:   TODAY'S TREATMENT: Skilled treatment session focused on pt's communication goals. SLP facilitated session by providing the following interventions:  Pt arrived to session with his wife reporting "he had been working really hard." Pt showed SLP his phone number on Text to Speech app to indicate that he had been practicing it. With moderate question cues pt able to write down "grocery, pay, money, sales" to describe the reason that he was practicing his number. In addition, pt wrote down "gas" and "pay" to indicate that he pumped and paid (inside with cashier) for there gas yesterday. He used a combination of names in contact list and written to talk about his grandson. He was able to pull up grandson's name but continued to state "sister" as their relationship. With maximal written cues, pt able to gain awareness that he was incorrectly saying the wrong word. With maximal multi-modal cues, pt able to identify him as grandson. Pt commented "that was tough." Pt also noted to have increased verbal output today. Likely this is result of increased practice.   PATIENT EDUCATION: Education details: see above Person educated: Patient and Wife Education method: Explanation, Facilities manager, Verbal cues, and Handouts Education comprehension: verbalized understanding  HOME EXERCISE PROGRAM: Continue practicing verbal sentences found within Text to Speech App  GOALS: Goals reviewed with patient? Yes  SHORT TERM GOALS: Target date: 10 sessions   UPDATED: 04/16/2023 3.   Pt will improve wording finding abilities by writing 3 or more words during communication exchanges to increase listener understanding with moderate A in 5 out of 7  opportunities; ONGOING;  5. Pt will improve spelling ability by accurately spelling SEMI-COMPLEX words during communication exchanges to increase listener understanding with min A in 5 out of 7 opportunities; ONGOING 6.  Pt will read self-generated words during communication exchanges with > 50% speech intelligibility given Min A articulatory cues; ONGOING  LONG TERM GOALS: Target date: 06/11/2023     Pt will use multimodal communication to express basic wants/needs in 4 of 10 opportunities.  Baseline: unable Goal status: ONGOING 2.  Pt will initial communication in 3 of 5 opportunities as reported by caregiver outside of ST sessions.   Baseline: no initiation  Goal Status: Initial     ASSESSMENT:  CLINICAL IMPRESSION: Pt is a 77 year old male who was seen today for therapy targeting his aphasia. Pt with improved communication using text to speech app. Pt and his wife report increased attempts at communication and improved functional activities since last session. Pt commented, "I am trying." See the above treatment note for details.   OBJECTIVE IMPAIRMENTS include expressive language, receptive  language, aphasia, and apraxia. These impairments are limiting patient from managing medications, managing appointments, managing finances, household responsibilities, ADLs/IADLs, and effectively communicating at home and in community. Factors affecting potential to achieve goals and functional outcome are severity of impairments. Patient will benefit from skilled SLP services to address above impairments and improve overall function.  REHAB POTENTIAL: Excellent  PLAN: SLP FREQUENCY: 3x/week  SLP DURATION: 12 weeks  PLANNED INTERVENTIONS: Language facilitation, Environmental controls, Internal/external aids, Functional tasks, Multimodal communication approach, SLP instruction and feedback, Compensatory strategies, and Patient/family education    Rheba Diamond B. Dreama Saa, M.S., CCC-SLP,  Tree surgeon Certified Brain Injury Specialist [facility]  [facility] Rehabilitation Services Office [phone] Ascom [phone] Fax [phone]

## 2023-04-28 ENCOUNTER — Ambulatory Visit: Payer: Medicare HMO | Admitting: Speech Pathology

## 2023-04-28 DIAGNOSIS — I6602 Occlusion and stenosis of left middle cerebral artery: Secondary | ICD-10-CM

## 2023-04-28 DIAGNOSIS — R482 Apraxia: Secondary | ICD-10-CM | POA: Diagnosis not present

## 2023-04-28 DIAGNOSIS — R4701 Aphasia: Secondary | ICD-10-CM

## 2023-04-28 NOTE — Therapy (Signed)
OUTPATIENT SPEECH LANGUAGE PATHOLOGY TREATMENT NOTE   Patient Name: Alan Stewart MRN: [id number] DOB:07-Dec-1945, 77 y.o., male 27 Date: 04/28/2023  PCP: Daniel Nones, MD REFERRING PROVIDER: Daniel Nones, MD   END OF SESSION  End of Session - 04/28/23 1010     Visit Number 113    Number of Visits 134    Date for SLP Re-Evaluation 06/11/23    Authorization Type [employer] H[employer]    Authorization Time Period 03/21/2023 thru 05/16/2023    Authorization - Visit Number 11    Authorization - Number of Visits 16    Progress Note Due on Visit 120    SLP Start Time 1015    SLP Stop Time  1100    SLP Time Calculation (min) 45 min    Activity Tolerance Patient tolerated treatment well                   Past Medical History:  Diagnosis Date   CAD (coronary artery disease)    a. 10/2021 NSTEMI/PCI: LM nl, LAD min irregs, LCX 52m (2.5x18 Onyx Frontier DES), OM1 nl, RCA 54m (3.5x26 Onyx Frontier DES), 30d.   Diastolic dysfunction    a. 10/2021 Echo: EF 50-55%, no rwma, Gr1 DD, nl RV fxn. No significant valvular dzs.   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 SCCis. MOHs 07/08/22   Past Surgical History:  Procedure Laterality Date   COLONOSCOPY N/A 10/09/2015   Procedure: COLONOSCOPY;  Surgeon: Wallace Cullens, MD;  Location: [facility] ENDOSCOPY;  Service: Gastroenterology;  Laterality: N/A;   CORONARY STENT INTERVENTION N/A 10/22/2021   Procedure: CORONARY STENT INTERVENTION;  Surgeon: Iran Ouch, MD;  Location: [facility] INVASIVE CV LAB;  Service: Cardiovascular;  Laterality: N/A;   IR CT HEAD LTD  05/21/2022   IR PERCUTANEOUS ART THROMBECTOMY/INFUSION INTRACRANIAL INC DIAG ANGIO  05/21/2022   LEFT HEART CATH AND CORONARY ANGIOGRAPHY N/A 10/22/2021   Procedure: LEFT HEART  CATH AND CORONARY ANGIOGRAPHY;  Surgeon: Iran Ouch, MD;  Location: [facility] INVASIVE CV LAB;  Service: Cardiovascular;  Laterality: N/A;   LOOP RECORDER INSERTION N/A 05/23/2022   Procedure: LOOP RECORDER INSERTION;  Surgeon: Lanier Prude, MD;  Location: [facility] INVASIVE CV LAB;  Service: Cardiovascular;  Laterality: N/A;   RADIOLOGY WITH ANESTHESIA N/A 05/20/2022   Procedure: IR WITH ANESTHESIA;  Surgeon: Julieanne Cotton, MD;  Location: [facility] OR;  Service: Radiology;  Laterality: N/A;   ROTATOR CUFF REPAIR     Patient Active Problem List   Diagnosis Date Noted   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 male with PMH: HTN, CAD, HLD, osteoarthritis, SCC, sleep apnea. He presented to the hospital on 05/20/22 with right sided weakness and aphasia with LKW reported by wife to be 10pm on 10/2.  He arrived  at [facility] as code stroke and taken for emergent CT/CTA which demonstrated left M1 occlusion. He received IV tenecteplase and IR thrombectomy due to large vessel occlusion.       DIAGNOSTIC FINDINGS:  MRI 05/22/2022 Cortical ischemia throughout the left MCA territory compatible with recent infarct. Relatively little affect on the white matter. 2. Small focus of acute hemorrhage along the anterior surface of the left temporal lobe Heidelberg classification 3c: Subarachnoid hemorrhage. 3. Normal intracranial MRA.  Restored flow in left MCA.    THERAPY DIAG:  Aphasia  Apraxia  Middle cerebral artery embolism, left  Rationale for  Evaluation and Treatment Rehabilitation  SUBJECTIVE:   PAIN:  Are you having pain? No  PATIENT GOALS: to be able to communicate again  SUBJECTIVE STATEMENT: "Tell them what you did this weekend, he keeps trying to talk more" Pt accompanied by: self and significant other  OBJECTIVE:   TODAY'S TREATMENT: Skilled treatment session focused on pt's communication goals. SLP facilitated session by providing the following interventions:  Pt arrived to session with his wife continuing to report that "he keeps trying to talk more." In addition, pt had sentence about his birthday preloaded in Text to Speech app.   SLP further facilitated session by having pt write down or locate information related to menu items for upcoming birthday supper at [employer] With moderate question cues, pt able to write down information. And with maximal cues, he was able to improve motor pattern to achieve ~80% speech intelligibility at the word level. He continues to be unaware that he deletes the first portion of words.   Instruction provided to his wife on ways to model speech patterns and cue pt for slow motor movements.   PATIENT EDUCATION: Education details: see above Person educated: Patient and Wife Education method: Explanation, Facilities manager, Verbal cues, and Handouts Education comprehension: verbalized understanding  HOME EXERCISE PROGRAM: Continue practicing verbal sentences found within Text to Speech App  GOALS: Goals reviewed with patient? Yes  SHORT TERM GOALS: Target date: 10 sessions   UPDATED: 04/16/2023 3.   Pt will improve wording finding abilities by writing 3 or more words during communication exchanges to increase listener understanding with moderate A in 5 out of 7 opportunities; ONGOING;  5. Pt will improve spelling ability by accurately spelling SEMI-COMPLEX words during communication exchanges to increase listener understanding with min A in 5 out of 7 opportunities; ONGOING 6.   Pt will read self-generated words during communication exchanges with > 50% speech intelligibility given Min A articulatory cues; ONGOING  LONG TERM GOALS: Target date: 06/11/2023     Pt will use multimodal communication to express basic wants/needs in 4 of 10 opportunities.  Baseline: unable Goal status: ONGOING 2.  Pt will initial communication in 3 of 5 opportunities as reported by caregiver outside of ST sessions.   Baseline: no initiation  Goal Status: Initial     ASSESSMENT:  CLINICAL IMPRESSION: Pt is a 77 year old male who was seen today for therapy targeting his aphasia. Pt with improved communication using text to speech app. Pt and his wife continue to report attempts at communication and improved functional activities. Pt does appear more committed to practice See the above treatment note for details.   OBJECTIVE IMPAIRMENTS include expressive language, receptive language, aphasia, and apraxia. These impairments are limiting patient from managing medications, managing appointments, managing finances, household responsibilities, ADLs/IADLs, and effectively communicating at home and in community. Factors affecting potential to achieve goals and functional outcome  are severity of impairments. Patient will benefit from skilled SLP services to address above impairments and improve overall function.  REHAB POTENTIAL: Excellent  PLAN: SLP FREQUENCY: 3x/week  SLP DURATION: 12 weeks  PLANNED INTERVENTIONS: Language facilitation, Environmental controls, Internal/external aids, Functional tasks, Multimodal communication approach, SLP instruction and feedback, Compensatory strategies, and Patient/family education    Domanique Luckett B. Dreama Saa, M.S., CCC-SLP, Tree surgeon Certified Brain Injury Specialist [facility]  [facility] Rehabilitation Services Office [phone] Ascom [phone] Fax [phone]

## 2023-04-30 ENCOUNTER — Ambulatory Visit: Payer: Medicare HMO | Admitting: Speech Pathology

## 2023-04-30 DIAGNOSIS — R4701 Aphasia: Secondary | ICD-10-CM | POA: Diagnosis not present

## 2023-04-30 DIAGNOSIS — I6602 Occlusion and stenosis of left middle cerebral artery: Secondary | ICD-10-CM

## 2023-04-30 DIAGNOSIS — R482 Apraxia: Secondary | ICD-10-CM

## 2023-04-30 NOTE — Therapy (Signed)
OUTPATIENT SPEECH LANGUAGE PATHOLOGY TREATMENT NOTE   Patient Name: Alan Stewart MRN: [id number] DOB:[birth year], 77 y.o., male 4 Date: 04/30/2023  PCP: Daniel Nones, MD REFERRING PROVIDER: Daniel Nones, MD   END OF SESSION  End of Session - 04/30/23 1025     Visit Number 114    Number of Visits 134    Date for SLP Re-Evaluation 06/11/23    Authorization Type [employer] H[employer]    Authorization Time Period 03/21/2023 thru 05/16/2023    Authorization - Visit Number 12    Authorization - Number of Visits 16    Progress Note Due on Visit 120    SLP Start Time 1015    SLP Stop Time  1100    SLP Time Calculation (min) 45 min    Activity Tolerance Patient tolerated treatment well                   Past Medical History:  Diagnosis Date   CAD (coronary artery disease)    a. 10/2021 NSTEMI/PCI: LM nl, LAD min irregs, LCX 67m (2.5x18 Onyx Frontier DES), OM1 nl, RCA 73m (3.5x26 Onyx Frontier DES), 30d.   Diastolic dysfunction    a. 10/2021 Echo: EF 50-55%, no rwma, Gr1 DD, nl RV fxn. No significant valvular dzs.   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 SCCis. MOHs 07/08/22   Past Surgical History:  Procedure Laterality Date   COLONOSCOPY N/A 10/09/2015   Procedure: COLONOSCOPY;  Surgeon: Wallace Cullens, MD;  Location: [facility] ENDOSCOPY;  Service: Gastroenterology;  Laterality: N/A;   CORONARY STENT INTERVENTION N/A 10/22/2021   Procedure: CORONARY STENT INTERVENTION;  Surgeon: Iran Ouch, MD;  Location: [facility] INVASIVE CV LAB;  Service: Cardiovascular;  Laterality: N/A;   IR CT HEAD LTD  05/21/2022   IR PERCUTANEOUS ART THROMBECTOMY/INFUSION INTRACRANIAL INC DIAG ANGIO  05/21/2022   LEFT HEART CATH AND CORONARY ANGIOGRAPHY N/A 10/22/2021   Procedure: LEFT HEART  CATH AND CORONARY ANGIOGRAPHY;  Surgeon: Iran Ouch, MD;  Location: [facility] INVASIVE CV LAB;  Service: Cardiovascular;  Laterality: N/A;   LOOP RECORDER INSERTION N/A 05/23/2022   Procedure: LOOP RECORDER INSERTION;  Surgeon: Lanier Prude, MD;  Location: [facility] INVASIVE CV LAB;  Service: Cardiovascular;  Laterality: N/A;   RADIOLOGY WITH ANESTHESIA N/A 05/20/2022   Procedure: IR WITH ANESTHESIA;  Surgeon: Julieanne Cotton, MD;  Location: [facility] OR;  Service: Radiology;  Laterality: N/A;   ROTATOR CUFF REPAIR     Patient Active Problem List   Diagnosis Date Noted   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 male with PMH: HTN, CAD, HLD, osteoarthritis, SCC, sleep apnea. He presented to the hospital on 05/20/22 with right sided weakness and aphasia with LKW reported by wife to be 10pm on 10/2.  He arrived  at [facility] as code stroke and taken for emergent CT/CTA which demonstrated left M1 occlusion. He received IV tenecteplase and IR thrombectomy due to large vessel occlusion.       DIAGNOSTIC FINDINGS:  MRI 05/22/2022 Cortical ischemia throughout the left MCA territory compatible with recent infarct. Relatively little affect on the white matter. 2. Small focus of acute hemorrhage along the anterior surface of the left temporal lobe Heidelberg classification 3c: Subarachnoid hemorrhage. 3. Normal intracranial MRA.  Restored flow in left MCA.    THERAPY DIAG:  Aphasia  Apraxia  Middle cerebral artery embolism, left  Rationale for  Evaluation and Treatment Rehabilitation  SUBJECTIVE:   PAIN:  Are you having pain? No  PATIENT GOALS: to be able to communicate again  SUBJECTIVE STATEMENT: Pt pulling out his Text ot Speech app with new information entered for his birthday supper! Pt accompanied by: self and significant other  OBJECTIVE:   TODAY'S TREATMENT: Skilled treatment session focused on pt's communication goals. SLP facilitated session by providing the following interventions:  Word finding: targeted thru use of writing and Text to Speech app - Pt arrived to session with information entered in his Text to Speech app regarding recent birthday supper.   Speech intelligibility - pt frequently omitted first portion of word and produces second portion for example "Road house" he says "house" pt with limited awareness. With written and verbal examples, pt able to increase awareness and also increase motor pattern   SLP demonstrated sequence of having pt write down words and then entering individual words into Text ot Speech to practice appropriate speech motor patterns   PATIENT EDUCATION: Education details: see above Person educated: Patient and Wife Education method: Explanation, Demonstration, Verbal cues, and Handouts Education comprehension: verbalized understanding  HOME EXERCISE PROGRAM: Continue practicing verbal sentences found within Text to Speech App  GOALS: Goals reviewed with patient? Yes  SHORT TERM GOALS: Target date: 10 sessions   UPDATED: 04/16/2023 3.   Pt will improve wording finding abilities by writing 3 or more words during communication exchanges to increase listener understanding with moderate A in 5 out of 7 opportunities; ONGOING;  5. Pt will improve spelling ability by accurately spelling SEMI-COMPLEX words during communication exchanges to increase listener understanding with min A in 5 out of 7 opportunities; ONGOING 6.  Pt will read self-generated words during communication  exchanges with > 50% speech intelligibility given Min A articulatory cues; ONGOING  LONG TERM GOALS: Target date: 06/11/2023     Pt will use multimodal communication to express basic wants/needs in 4 of 10 opportunities.  Baseline: unable Goal status: ONGOING 2.  Pt will initial communication in 3 of 5 opportunities as reported by caregiver outside of ST sessions.   Baseline: no initiation  Goal Status: Initial     ASSESSMENT:  CLINICAL IMPRESSION: Pt is a 77 year old male who was seen today for therapy targeting his aphasia. Pt with improved communication using text to speech app. Pt and his wife continue to report attempts at communication and improved functional activities. Pt does appear more committed to practice See the above treatment note for details.   OBJECTIVE IMPAIRMENTS include expressive language, receptive language, aphasia, and apraxia. These impairments are limiting patient from managing medications, managing appointments, managing finances, household responsibilities, ADLs/IADLs, and effectively communicating at home and in community. Factors affecting potential to achieve goals and functional outcome are severity of impairments. Patient will benefit from skilled SLP services to address above impairments and improve overall  function.  REHAB POTENTIAL: Excellent  PLAN: SLP FREQUENCY: 3x/week  SLP DURATION: 12 weeks  PLANNED INTERVENTIONS: Language facilitation, Environmental controls, Internal/external aids, Functional tasks, Multimodal communication approach, SLP instruction and feedback, Compensatory strategies, and Patient/family education    Edvin Albus B. Dreama Saa, M.S., CCC-SLP, Tree surgeon Certified Brain Injury Specialist [facility]  [facility] Rehabilitation Services Office [phone] Ascom [phone] Fax [phone]

## 2023-05-01 ENCOUNTER — Ambulatory Visit (INDEPENDENT_AMBULATORY_CARE_PROVIDER_SITE_OTHER): Payer: Medicare HMO

## 2023-05-01 DIAGNOSIS — I63412 Cerebral infarction due to embolism of left middle cerebral artery: Secondary | ICD-10-CM | POA: Diagnosis not present

## 2023-05-01 LAB — CUP PACEART REMOTE DEVICE CHECK
Date Time Interrogation Session: 20240912072408
Implantable Pulse Generator Implant Date: 20231005
Pulse Gen Model: 450218
Pulse Gen Serial Number: 95054077

## 2023-05-02 ENCOUNTER — Ambulatory Visit: Payer: Medicare HMO | Admitting: Speech Pathology

## 2023-05-02 DIAGNOSIS — R4701 Aphasia: Secondary | ICD-10-CM | POA: Diagnosis not present

## 2023-05-02 DIAGNOSIS — R482 Apraxia: Secondary | ICD-10-CM | POA: Diagnosis not present

## 2023-05-02 DIAGNOSIS — I6602 Occlusion and stenosis of left middle cerebral artery: Secondary | ICD-10-CM | POA: Diagnosis not present

## 2023-05-02 NOTE — Therapy (Signed)
OUTPATIENT SPEECH LANGUAGE PATHOLOGY TREATMENT NOTE   Patient Name: Alan Stewart MRN: [id number] DOB:1946-01-15, 77 y.o., male 42 Date: 05/02/2023  PCP: Daniel Nones, MD REFERRING PROVIDER: Daniel Nones, MD   END OF SESSION  End of Session - 05/02/23 1012     Visit Number 115    Number of Visits 134    Date for SLP Re-Evaluation 06/11/23    Authorization Type [employer] H[employer]    Authorization Time Period 03/21/2023 thru 05/16/2023    Authorization - Visit Number 13    Authorization - Number of Visits 16    Progress Note Due on Visit 120    SLP Start Time 1015    SLP Stop Time  1100    SLP Time Calculation (min) 45 min    Activity Tolerance Patient tolerated treatment well                   Past Medical History:  Diagnosis Date   CAD (coronary artery disease)    a. 10/2021 NSTEMI/PCI: LM nl, LAD min irregs, LCX 7m (2.5x18 Onyx Frontier DES), OM1 nl, RCA 66m (3.5x26 Onyx Frontier DES), 30d.   Diastolic dysfunction    a. 10/2021 Echo: EF 50-55%, no rwma, Gr1 DD, nl RV fxn. No significant valvular dzs.   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 SCCis. MOHs 07/08/22   Past Surgical History:  Procedure Laterality Date   COLONOSCOPY N/A 10/09/2015   Procedure: COLONOSCOPY;  Surgeon: Wallace Cullens, MD;  Location: [facility] ENDOSCOPY;  Service: Gastroenterology;  Laterality: N/A;   CORONARY STENT INTERVENTION N/A 10/22/2021   Procedure: CORONARY STENT INTERVENTION;  Surgeon: Iran Ouch, MD;  Location: [facility] INVASIVE CV LAB;  Service: Cardiovascular;  Laterality: N/A;   IR CT HEAD LTD  05/21/2022   IR PERCUTANEOUS ART THROMBECTOMY/INFUSION INTRACRANIAL INC DIAG ANGIO  05/21/2022   LEFT HEART CATH AND CORONARY ANGIOGRAPHY N/A 10/22/2021   Procedure: LEFT HEART  CATH AND CORONARY ANGIOGRAPHY;  Surgeon: Iran Ouch, MD;  Location: [facility] INVASIVE CV LAB;  Service: Cardiovascular;  Laterality: N/A;   LOOP RECORDER INSERTION N/A 05/23/2022   Procedure: LOOP RECORDER INSERTION;  Surgeon: Lanier Prude, MD;  Location: [facility] INVASIVE CV LAB;  Service: Cardiovascular;  Laterality: N/A;   RADIOLOGY WITH ANESTHESIA N/A 05/20/2022   Procedure: IR WITH ANESTHESIA;  Surgeon: Julieanne Cotton, MD;  Location: [facility] OR;  Service: Radiology;  Laterality: N/A;   ROTATOR CUFF REPAIR     Patient Active Problem List   Diagnosis Date Noted   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 male with PMH: HTN, CAD, HLD, osteoarthritis, SCC, sleep apnea. He presented to the hospital on 05/20/22 with right sided weakness and aphasia with LKW reported by wife to be 10pm on 10/2.  He arrived  at [facility] as code stroke and taken for emergent CT/CTA which demonstrated left M1 occlusion. He received IV tenecteplase and IR thrombectomy due to large vessel occlusion.       DIAGNOSTIC FINDINGS:  MRI 05/22/2022 Cortical ischemia throughout the left MCA territory compatible with recent infarct. Relatively little affect on the white matter. 2. Small focus of acute hemorrhage along the anterior surface of the left temporal lobe Heidelberg classification 3c: Subarachnoid hemorrhage. 3. Normal intracranial MRA.  Restored flow in left MCA.    THERAPY DIAG:  Aphasia  Apraxia  Middle cerebral artery embolism, left  Rationale for  Evaluation and Treatment Rehabilitation  SUBJECTIVE:   PAIN:  Are you having pain? No  PATIENT GOALS: to be able to communicate again  SUBJECTIVE STATEMENT: Pt had list of grocery items on his phone Pt accompanied by: self and significant other  OBJECTIVE:   TODAY'S TREATMENT: Skilled treatment session focused on pt's communication goals. SLP facilitated session by providing the following interventions:  Word finding: targeted thru use of writing and Text to Speech app - Min A to occasional Mod A questions to elicit words  Speech intelligibility - much improved this session, pt independently typing words into Text to Speech and independently replaying words as he practiced the correct motor patterns    PATIENT EDUCATION: Education details: see above Person educated: Patient and Wife Education method: Explanation, Demonstration, Verbal cues, and Handouts Education comprehension: verbalized understanding  HOME EXERCISE PROGRAM: Continue practicing verbal sentences found within Text to Speech App  GOALS: Goals reviewed with patient? Yes  SHORT TERM GOALS: Target date: 10 sessions   UPDATED: 04/16/2023 3.   Pt will improve wording finding abilities by writing 3 or more words during communication exchanges to increase listener understanding with moderate A in 5 out of 7 opportunities; ONGOING;  5. Pt will improve spelling ability by accurately spelling SEMI-COMPLEX words during communication exchanges to increase listener understanding with min A in 5 out of 7 opportunities; ONGOING 6.  Pt will read self-generated words during communication exchanges with > 50% speech intelligibility given Min A articulatory cues; ONGOING  LONG TERM GOALS: Target date: 06/11/2023     Pt will use multimodal communication to express basic wants/needs in 4 of 10 opportunities.  Baseline: unable Goal status: ONGOING 2.  Pt will initial communication in 3 of 5 opportunities as reported by  caregiver outside of ST sessions.   Baseline: no initiation  Goal Status: Initial     ASSESSMENT:  CLINICAL IMPRESSION: Pt is a 77 year old male who was seen today for therapy targeting his aphasia. Pt with improved communication using text to speech app. Pt with great progress and initiative with communication during today's session. See the above treatment note for details.   OBJECTIVE IMPAIRMENTS include expressive language, receptive language, aphasia, and apraxia. These impairments are limiting patient from managing medications, managing appointments, managing finances, household responsibilities, ADLs/IADLs, and effectively communicating at home and in community. Factors affecting potential to achieve goals and functional outcome are severity of impairments. Patient will benefit from skilled SLP services to address above impairments and improve overall function.  REHAB POTENTIAL: Excellent  PLAN: SLP FREQUENCY: 3x/week  SLP DURATION: 12 weeks  PLANNED INTERVENTIONS: Language facilitation, Environmental controls, Internal/external aids, Functional tasks, Multimodal communication approach, SLP instruction and feedback, Compensatory strategies, and Patient/family education    Stewart Pimenta B. Dreama Saa, M.S., CCC-SLP, Tree surgeon Certified Brain Injury Specialist [place]  [facility] Rehabilitation Services Office [phone]  Ascom [phone] Fax [phone]

## 2023-05-05 ENCOUNTER — Ambulatory Visit: Payer: Medicare HMO | Admitting: Speech Pathology

## 2023-05-05 DIAGNOSIS — I6602 Occlusion and stenosis of left middle cerebral artery: Secondary | ICD-10-CM | POA: Diagnosis not present

## 2023-05-05 DIAGNOSIS — R4701 Aphasia: Secondary | ICD-10-CM

## 2023-05-05 DIAGNOSIS — R482 Apraxia: Secondary | ICD-10-CM | POA: Diagnosis not present

## 2023-05-05 NOTE — Therapy (Signed)
OUTPATIENT SPEECH LANGUAGE PATHOLOGY TREATMENT NOTE   Patient Name: Alan Stewart MRN: [id number] DOB:Dec 10, 1945, 77 y.o., male 14 Date: 05/05/2023  PCP: Daniel Nones, MD REFERRING PROVIDER: Daniel Nones, MD   END OF SESSION  End of Session - 05/05/23 1406     Visit Number 116    Number of Visits 134    Date for SLP Re-Evaluation 06/11/23    Authorization Type [employer] H[employer]    Authorization Time Period 03/21/2023 thru 05/16/2023    Authorization - Visit Number 14    Authorization - Number of Visits 16    Progress Note Due on Visit 120    SLP Start Time 1015    SLP Stop Time  1100    SLP Time Calculation (min) 45 min    Activity Tolerance Patient tolerated treatment well                   Past Medical History:  Diagnosis Date   CAD (coronary artery disease)    a. 10/2021 NSTEMI/PCI: LM nl, LAD min irregs, LCX 101m (2.5x18 Onyx Frontier DES), OM1 nl, RCA 61m (3.5x26 Onyx Frontier DES), 30d.   Diastolic dysfunction    a. 10/2021 Echo: EF 50-55%, no rwma, Gr1 DD, nl RV fxn. No significant valvular dzs.   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 SCCis. MOHs 07/08/22   Past Surgical History:  Procedure Laterality Date   COLONOSCOPY N/A 10/09/2015   Procedure: COLONOSCOPY;  Surgeon: Wallace Cullens, MD;  Location: [facility] ENDOSCOPY;  Service: Gastroenterology;  Laterality: N/A;   CORONARY STENT INTERVENTION N/A 10/22/2021   Procedure: CORONARY STENT INTERVENTION;  Surgeon: Iran Ouch, MD;  Location: [facility] INVASIVE CV LAB;  Service: Cardiovascular;  Laterality: N/A;   IR CT HEAD LTD  05/21/2022   IR PERCUTANEOUS ART THROMBECTOMY/INFUSION INTRACRANIAL INC DIAG ANGIO  05/21/2022   LEFT HEART CATH AND CORONARY ANGIOGRAPHY N/A 10/22/2021   Procedure: LEFT HEART  CATH AND CORONARY ANGIOGRAPHY;  Surgeon: Iran Ouch, MD;  Location: [facility] INVASIVE CV LAB;  Service: Cardiovascular;  Laterality: N/A;   LOOP RECORDER INSERTION N/A 05/23/2022   Procedure: LOOP RECORDER INSERTION;  Surgeon: Lanier Prude, MD;  Location: [facility] INVASIVE CV LAB;  Service: Cardiovascular;  Laterality: N/A;   RADIOLOGY WITH ANESTHESIA N/A 05/20/2022   Procedure: IR WITH ANESTHESIA;  Surgeon: Julieanne Cotton, MD;  Location: [facility] OR;  Service: Radiology;  Laterality: N/A;   ROTATOR CUFF REPAIR     Patient Active Problem List   Diagnosis Date Noted   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 male with PMH: HTN, CAD, HLD, osteoarthritis, SCC, sleep apnea. He presented to the hospital on 05/20/22 with right sided weakness and aphasia with LKW reported by wife to be 10pm on 10/2.  He arrived  at [facility] as code stroke and taken for emergent CT/CTA which demonstrated left M1 occlusion. He received IV tenecteplase and IR thrombectomy due to large vessel occlusion.       DIAGNOSTIC FINDINGS:  MRI 05/22/2022 Cortical ischemia throughout the left MCA territory compatible with recent infarct. Relatively little affect on the white matter. 2. Small focus of acute hemorrhage along the anterior surface of the left temporal lobe Heidelberg classification 3c: Subarachnoid hemorrhage. 3. Normal intracranial MRA.  Restored flow in left MCA.    THERAPY DIAG:  Aphasia  Apraxia  Middle cerebral artery embolism, left  Rationale for  Evaluation and Treatment Rehabilitation  SUBJECTIVE:   PAIN:  Are you having pain? No  PATIENT GOALS: to be able to communicate again  SUBJECTIVE STATEMENT: His wife reported - "We are working on - good morning Sascha Baugher"  Pt accompanied by: self and significant other  OBJECTIVE:   TODAY'S TREATMENT: Skilled treatment session focused on pt's communication goals. SLP facilitated session by providing the following interventions:  Word finding: pt used combination of Text to Speech and paper/pencil writing during conversation about [employer] games this weekend - pt with more initiation of playing Text to Speech words and attempts to verbally imitate them  Speech intelligibility - motor speech patterns targeted thru use of Talk[employer] Therapy App - functionally/mechanics of app difficult for pt to conform to but use of app did promote pt's awareness of errors. When provided with written functional phrases and verbal model, pt with improved speech intelligibility d/t accurate motor patterns    PATIENT EDUCATION: Education details: see above Person educated: Patient and Wife Education method: Explanation, Demonstration, Verbal cues, and Handouts Education comprehension: verbalized understanding  HOME EXERCISE PROGRAM: Instructed wife to provide written phrases (to support verbal imitation of functional phrases)  GOALS: Goals reviewed with patient? Yes  SHORT TERM GOALS: Target date: 10 sessions   UPDATED: 04/16/2023 3.   Pt will improve wording finding abilities by writing 3 or more words during communication exchanges to increase listener understanding with moderate A in 5 out of 7 opportunities; ONGOING;  5. Pt will improve spelling ability by accurately spelling SEMI-COMPLEX words during communication exchanges to increase listener understanding with min A in 5 out of 7 opportunities; ONGOING 6.  Pt will read self-generated words during communication exchanges with > 50% speech intelligibility  given Min A articulatory cues; ONGOING  LONG TERM GOALS: Target date: 06/11/2023     Pt will use multimodal communication to express basic wants/needs in 4 of 10 opportunities.  Baseline: unable Goal status: ONGOING 2.  Pt will initial communication in 3 of 5 opportunities as reported by caregiver outside of ST sessions.   Baseline: no initiation  Goal Status: Initial     ASSESSMENT:  CLINICAL IMPRESSION: Pt is a 77 year old male who was seen today for therapy targeting his aphasia. Pt with improved communication using text to speech app. Pt with great progress and initiative with communication during today's session. In addition, his wife reports that he is talking more at home - "he doesn't realize it but my face shows the surprise when he speaks in full sentences at home." See the above treatment note for details.   OBJECTIVE IMPAIRMENTS include expressive language, receptive language, aphasia, and apraxia. These impairments are limiting patient from managing medications, managing appointments, managing finances, household responsibilities, ADLs/IADLs, and effectively communicating at home and in community. Factors affecting potential to achieve goals and functional outcome are severity of impairments. Patient will  benefit from skilled SLP services to address above impairments and improve overall function.  REHAB POTENTIAL: Excellent  PLAN: SLP FREQUENCY: 3x/week  SLP DURATION: 12 weeks  PLANNED INTERVENTIONS: Language facilitation, Environmental controls, Internal/external aids, Functional tasks, Multimodal communication approach, SLP instruction and feedback, Compensatory strategies, and Patient/family education    Pharrell Ledford B. Dreama Saa, M.S., CCC-SLP, Tree surgeon Certified Brain Injury Specialist [facility]  [facility] Rehabilitation Services Office [phone] Ascom [phone] Fax [phone]

## 2023-05-07 ENCOUNTER — Ambulatory Visit: Payer: Medicare HMO | Admitting: Speech Pathology

## 2023-05-09 ENCOUNTER — Ambulatory Visit: Payer: Medicare HMO | Admitting: Speech Pathology

## 2023-05-09 DIAGNOSIS — I6602 Occlusion and stenosis of left middle cerebral artery: Secondary | ICD-10-CM

## 2023-05-09 DIAGNOSIS — R482 Apraxia: Secondary | ICD-10-CM | POA: Diagnosis not present

## 2023-05-09 DIAGNOSIS — R4701 Aphasia: Secondary | ICD-10-CM

## 2023-05-09 NOTE — Therapy (Signed)
OUTPATIENT SPEECH LANGUAGE PATHOLOGY TREATMENT NOTE   Patient Name: Alan Stewart MRN: [id number] DOB:[birth year], 77 y.o., male 18 Date: 05/09/2023  PCP: Daniel Nones, MD REFERRING PROVIDER: Daniel Nones, MD   END OF SESSION  End of Session - 05/09/23 1021     Visit Number 117    Number of Visits 134    Date for SLP Re-Evaluation 06/11/23    Authorization Type [employer] H[employer]    Authorization Time Period 03/21/2023 thru 05/16/2023    Authorization - Visit Number 15    Authorization - Number of Visits 16    Progress Note Due on Visit 120    SLP Start Time 1015    SLP Stop Time  1100    SLP Time Calculation (min) 45 min    Activity Tolerance Patient tolerated treatment well                   Past Medical History:  Diagnosis Date   CAD (coronary artery disease)    a. 10/2021 NSTEMI/PCI: LM nl, LAD min irregs, LCX 64m (2.5x18 Onyx Frontier DES), OM1 nl, RCA 85m (3.5x26 Onyx Frontier DES), 30d.   Diastolic dysfunction    a. 10/2021 Echo: EF 50-55%, no rwma, Gr1 DD, nl RV fxn. No significant valvular dzs.   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 SCCis. MOHs 07/08/22   Past Surgical History:  Procedure Laterality Date   COLONOSCOPY N/A 10/09/2015   Procedure: COLONOSCOPY;  Surgeon: Wallace Cullens, MD;  Location: [facility] ENDOSCOPY;  Service: Gastroenterology;  Laterality: N/A;   CORONARY STENT INTERVENTION N/A 10/22/2021   Procedure: CORONARY STENT INTERVENTION;  Surgeon: Iran Ouch, MD;  Location: [facility] INVASIVE CV LAB;  Service: Cardiovascular;  Laterality: N/A;   IR CT HEAD LTD  05/21/2022   IR PERCUTANEOUS ART THROMBECTOMY/INFUSION INTRACRANIAL INC DIAG ANGIO  05/21/2022   LEFT HEART CATH AND CORONARY ANGIOGRAPHY N/A 10/22/2021   Procedure: LEFT HEART  CATH AND CORONARY ANGIOGRAPHY;  Surgeon: Iran Ouch, MD;  Location: [facility] INVASIVE CV LAB;  Service: Cardiovascular;  Laterality: N/A;   LOOP RECORDER INSERTION N/A 05/23/2022   Procedure: LOOP RECORDER INSERTION;  Surgeon: Lanier Prude, MD;  Location: [facility] INVASIVE CV LAB;  Service: Cardiovascular;  Laterality: N/A;   RADIOLOGY WITH ANESTHESIA N/A 05/20/2022   Procedure: IR WITH ANESTHESIA;  Surgeon: Julieanne Cotton, MD;  Location: [facility] OR;  Service: Radiology;  Laterality: N/A;   ROTATOR CUFF REPAIR     Patient Active Problem List   Diagnosis Date Noted   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 male with PMH: HTN, CAD, HLD, osteoarthritis, SCC, sleep apnea. He presented to the hospital on 05/20/22 with right sided weakness and aphasia with LKW reported by wife to be 10pm on 10/2.  He arrived  at [facility] as code stroke and taken for emergent CT/CTA which demonstrated left M1 occlusion. He received IV tenecteplase and IR thrombectomy due to large vessel occlusion.       DIAGNOSTIC FINDINGS:  MRI 05/22/2022 Cortical ischemia throughout the left MCA territory compatible with recent infarct. Relatively little affect on the white matter. 2. Small focus of acute hemorrhage along the anterior surface of the left temporal lobe Heidelberg classification 3c: Subarachnoid hemorrhage. 3. Normal intracranial MRA.  Restored flow in left MCA.    THERAPY DIAG:  Aphasia  Apraxia  Middle cerebral artery embolism, left  Rationale for  Evaluation and Treatment Rehabilitation  SUBJECTIVE:   PAIN:  Are you having pain? No  PATIENT GOALS: to be able to communicate again  SUBJECTIVE STATEMENT: "It has been a good week" pt pulled out his phone and Text to Speech App Pt accompanied by: self and significant other  OBJECTIVE:   TODAY'S TREATMENT: Skilled treatment session focused on pt's communication goals. SLP facilitated session by providing the following interventions:  Pt had new phrases/sentences programmed into app which he read with 100% speech intelligibility without playing text prior to  Where are you  Time for groceries  I am good  What's for dinner  Targeted adding additional phrases to improve initiation of communication "How does it taste?" - with supervision level A, pt able to include all words with > 95% speech intelligibility  Word finding: pt used combination of Text to Speech and paper/pencil writing during conversation about recent meal at restaurant   Speech intelligibility - much improved, decreased reliance on verbal model x 5 throughout session    PATIENT EDUCATION: Education details: see above Person educated: Patient and Wife Education method: Explanation, Demonstration, Verbal cues, and Handouts Education comprehension: verbalized understanding  HOME EXERCISE PROGRAM: Instructed wife to provide written phrases (to support verbal imitation of functional phrases)  GOALS: Goals reviewed with patient? Yes  SHORT TERM GOALS: Target date: 10 sessions   UPDATED: 04/16/2023 3.   Pt will improve wording finding abilities by writing 3 or more words during communication exchanges to increase listener understanding with moderate A in 5 out of 7 opportunities; ONGOING;  5. Pt will improve spelling ability by accurately spelling SEMI-COMPLEX words during communication exchanges to increase listener understanding with min A in 5 out of 7 opportunities; ONGOING 6.  Pt will read self-generated  words during communication exchanges with > 50% speech intelligibility given Min A articulatory cues; ONGOING  LONG TERM GOALS: Target date: 06/11/2023     Pt will use multimodal communication to express basic wants/needs in 4 of 10 opportunities.  Baseline: unable Goal status: ONGOING 2.  Pt will initial communication in 3 of 5 opportunities as reported by caregiver outside of ST sessions.   Baseline: no initiation  Goal Status: Initial     ASSESSMENT:  CLINICAL IMPRESSION: Pt is a 77 year old male who was seen today for therapy targeting his aphasia. Pt with improved communication using text to speech app. Pt with independent use of Text to Speech features as well as improved speech intelligibility without verbal model on App. See the above treatment note for details.   OBJECTIVE IMPAIRMENTS include expressive language, receptive language, aphasia, and apraxia. These impairments are limiting patient from managing medications, managing appointments, managing finances, household responsibilities, ADLs/IADLs, and effectively communicating at home and in community. Factors affecting potential to achieve goals and functional outcome are severity of impairments. Patient will benefit from skilled SLP services to  address above impairments and improve overall function.  REHAB POTENTIAL: Excellent  PLAN: SLP FREQUENCY: 3x/week  SLP DURATION: 12 weeks  PLANNED INTERVENTIONS: Language facilitation, Environmental controls, Internal/external aids, Functional tasks, Multimodal communication approach, SLP instruction and feedback, Compensatory strategies, and Patient/family education    Kalie Cabral B. Dreama Saa, M.S., CCC-SLP, Tree surgeon Certified Brain Injury Specialist [facility]  [facility] Rehabilitation Services Office [phone] Ascom [phone] Fax [phone]

## 2023-05-12 ENCOUNTER — Ambulatory Visit: Payer: Medicare HMO | Admitting: Speech Pathology

## 2023-05-12 DIAGNOSIS — I6602 Occlusion and stenosis of left middle cerebral artery: Secondary | ICD-10-CM | POA: Diagnosis not present

## 2023-05-12 DIAGNOSIS — R4701 Aphasia: Secondary | ICD-10-CM | POA: Diagnosis not present

## 2023-05-12 DIAGNOSIS — R482 Apraxia: Secondary | ICD-10-CM | POA: Diagnosis not present

## 2023-05-12 NOTE — Therapy (Signed)
OUTPATIENT SPEECH LANGUAGE PATHOLOGY TREATMENT NOTE   Patient Name: Alan Stewart MRN: [id number] DOB:[birth year], 77 y.o., male 49 Date: 05/12/2023  PCP: Daniel Nones, MD REFERRING PROVIDER: Daniel Nones, MD   END OF SESSION  End of Session - 05/12/23 1005     Visit Number 118    Number of Visits 134    Date for SLP Re-Evaluation 06/11/23    Authorization Type [employer]    Authorization Time Period 03/21/2023 thru 05/16/2023    Authorization - Visit Number 16    Authorization - Number of Visits 16    Progress Note Due on Visit 120    SLP Start Time 1015    SLP Stop Time  1100    SLP Time Calculation (min) 45 min    Activity Tolerance Patient tolerated treatment well                   Past Medical History:  Diagnosis Date   CAD (coronary artery disease)    a. 10/2021 NSTEMI/PCI: LM nl, LAD min irregs, LCX 57m (2.5x18 Onyx Frontier DES), OM1 nl, RCA 73m (3.5x26 Onyx Frontier DES), 30d.   Diastolic dysfunction    a. 10/2021 Echo: EF 50-55%, no rwma, Gr1 DD, nl RV fxn. No significant valvular dzs.   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 SCCis. MOHs 07/08/22   Past Surgical History:  Procedure Laterality Date   COLONOSCOPY N/A 10/09/2015   Procedure: COLONOSCOPY;  Surgeon: Wallace Cullens, MD;  Location: [facility] ENDOSCOPY;  Service: Gastroenterology;  Laterality: N/A;   CORONARY STENT INTERVENTION N/A 10/22/2021   Procedure: CORONARY STENT INTERVENTION;  Surgeon: Iran Ouch, MD;  Location: [facility] INVASIVE CV LAB;  Service: Cardiovascular;  Laterality: N/A;   IR CT HEAD LTD  05/21/2022   IR PERCUTANEOUS ART THROMBECTOMY/INFUSION INTRACRANIAL INC DIAG ANGIO  05/21/2022   LEFT HEART CATH AND CORONARY ANGIOGRAPHY N/A 10/22/2021   Procedure: LEFT HEART  CATH AND CORONARY ANGIOGRAPHY;  Surgeon: Iran Ouch, MD;  Location: [facility] INVASIVE CV LAB;  Service: Cardiovascular;  Laterality: N/A;   LOOP RECORDER INSERTION N/A 05/23/2022   Procedure: LOOP RECORDER INSERTION;  Surgeon: Lanier Prude, MD;  Location: [facility] INVASIVE CV LAB;  Service: Cardiovascular;  Laterality: N/A;   RADIOLOGY WITH ANESTHESIA N/A 05/20/2022   Procedure: IR WITH ANESTHESIA;  Surgeon: Julieanne Cotton, MD;  Location: [facility] OR;  Service: Radiology;  Laterality: N/A;   ROTATOR CUFF REPAIR     Patient Active Problem List   Diagnosis Date Noted   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 male with PMH: HTN, CAD, HLD, osteoarthritis, SCC, sleep apnea. He presented to the hospital on 05/20/22 with right sided weakness and aphasia with LKW reported by wife to be 10pm on 10/2.  He arrived  at [facility] as code stroke and taken for emergent CT/CTA which demonstrated left M1 occlusion. He received IV tenecteplase and IR thrombectomy due to large vessel occlusion.       DIAGNOSTIC FINDINGS:  MRI 05/22/2022 Cortical ischemia throughout the left MCA territory compatible with recent infarct. Relatively little affect on the white matter. 2. Small focus of acute hemorrhage along the anterior surface of the left temporal lobe Heidelberg classification 3c: Subarachnoid hemorrhage. 3. Normal intracranial MRA.  Restored flow in left MCA.    THERAPY DIAG:  Aphasia  Apraxia  Middle cerebral artery embolism, left  Rationale for  Evaluation and Treatment Rehabilitation  SUBJECTIVE:   PAIN:  Are you having pain? No  PATIENT GOALS: to be able to communicate again  SUBJECTIVE STATEMENT: Pt started giggling and said "football" pointing to his wife - indicating that her football team lost again this weekend Pt accompanied by: self and significant other  OBJECTIVE:   TODAY'S TREATMENT: Skilled treatment session focused on pt's communication goals. SLP facilitated session by providing the following interventions:  Pt had a written list of phrases "I hate these" as he pulled the paper out of his pocket. The paper contained "I am fine, I fixed my breakfast, I washed our windows, I walk Molly." With moderate A hierarchical cues, pt able to produce with > 95% accuracy.   Targeted adding additional phrases to improve initiation of communication "I am thirsty" - with supervision level A, pt able to include all words with > 95% speech intelligibility  Word finding: pt used combination of Text to Speech and paper/pencil writing during conversation about recent meal at home  Speech intelligibility - much improved, decreased reliance on verbal model x 7 throughout session    PATIENT EDUCATION: Education details: see above Person educated: Patient and Wife Education method: Explanation, Demonstration, Verbal cues, and Handouts Education comprehension: verbalized understanding  HOME EXERCISE PROGRAM: Instructed wife to provide written phrases (to support verbal imitation of functional phrases)  GOALS: Goals reviewed with patient? Yes  SHORT TERM GOALS: Target date: 10 sessions   UPDATED: 04/16/2023 3.   Pt will improve wording finding abilities by writing 3 or more words during communication exchanges to increase listener understanding with moderate A in 5 out of 7 opportunities; ONGOING;  5. Pt will improve spelling ability by accurately spelling SEMI-COMPLEX words during communication exchanges to increase listener  understanding with min A in 5 out of 7 opportunities; ONGOING 6.  Pt will read self-generated words during communication exchanges with > 50% speech intelligibility given Min A articulatory cues; ONGOING  LONG TERM GOALS: Target date: 06/11/2023     Pt will use multimodal communication to express basic wants/needs in 4 of 10 opportunities.  Baseline: unable Goal status: ONGOING 2.  Pt will initial communication in 3 of 5 opportunities as reported by caregiver outside of ST sessions.   Baseline: no initiation  Goal Status: Initial     ASSESSMENT:  CLINICAL IMPRESSION: Pt is a 77 year old male who was seen today for therapy targeting his aphasia. Pt with improved communication using text to speech app. Pt with independent use of Text to Speech features as well as improved speech intelligibility without verbal model on App. See the above treatment note for details.   OBJECTIVE IMPAIRMENTS include expressive language, receptive language, aphasia, and apraxia. These impairments are limiting patient from managing medications, managing appointments, managing finances, household responsibilities, ADLs/IADLs, and effectively communicating at home and in community. Factors affecting potential to  achieve goals and functional outcome are severity of impairments. Patient will benefit from skilled SLP services to address above impairments and improve overall function.  REHAB POTENTIAL: Excellent  PLAN: SLP FREQUENCY: 3x/week  SLP DURATION: 12 weeks  PLANNED INTERVENTIONS: Language facilitation, Environmental controls, Internal/external aids, Functional tasks, Multimodal communication approach, SLP instruction and feedback, Compensatory strategies, and Patient/family education    Brenden Rudman B. Dreama Saa, M.S., CCC-SLP, Tree surgeon Certified Brain Injury Specialist [facility]  [facility] Rehabilitation Services Office [phone] Ascom [phone] Fax  [phone]

## 2023-05-12 NOTE — Progress Notes (Signed)
[facility] Summary Report / Loop Recorder 

## 2023-05-14 ENCOUNTER — Ambulatory Visit: Payer: Medicare HMO | Admitting: Speech Pathology

## 2023-05-14 DIAGNOSIS — R4701 Aphasia: Secondary | ICD-10-CM

## 2023-05-14 DIAGNOSIS — I6602 Occlusion and stenosis of left middle cerebral artery: Secondary | ICD-10-CM

## 2023-05-14 DIAGNOSIS — R482 Apraxia: Secondary | ICD-10-CM | POA: Diagnosis not present

## 2023-05-14 NOTE — Therapy (Signed)
OUTPATIENT SPEECH LANGUAGE PATHOLOGY TREATMENT NOTE   Patient Name: Alan Stewart MRN: [id number] DOB:August 18, 1946, 77 y.o., male 50 Date: 05/14/2023  PCP: Daniel Nones, MD REFERRING PROVIDER: Daniel Nones, MD   END OF SESSION  End of Session - 05/14/23 1017     Visit Number 119    Number of Visits 134    Date for SLP Re-Evaluation 06/11/23    Authorization Type [employer] H[employer]    Authorization Time Period 03/21/2023 thru 07/04/2023    Authorization - Visit Number 17    Authorization - Number of Visits 40    Progress Note Due on Visit 120    SLP Start Time 1015    SLP Stop Time  1100    SLP Time Calculation (min) 45 min    Activity Tolerance Patient tolerated treatment well                   Past Medical History:  Diagnosis Date   CAD (coronary artery disease)    a. 10/2021 NSTEMI/PCI: LM nl, LAD min irregs, LCX 78m (2.5x18 Onyx Frontier DES), OM1 nl, RCA 43m (3.5x26 Onyx Frontier DES), 30d.   Diastolic dysfunction    a. 10/2021 Echo: EF 50-55%, no rwma, Gr1 DD, nl RV fxn. No significant valvular dzs.   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 SCCis. MOHs 07/08/22   Past Surgical History:  Procedure Laterality Date   COLONOSCOPY N/A 10/09/2015   Procedure: COLONOSCOPY;  Surgeon: Wallace Cullens, MD;  Location: [facility] ENDOSCOPY;  Service: Gastroenterology;  Laterality: N/A;   CORONARY STENT INTERVENTION N/A 10/22/2021   Procedure: CORONARY STENT INTERVENTION;  Surgeon: Iran Ouch, MD;  Location: [facility] INVASIVE CV LAB;  Service: Cardiovascular;  Laterality: N/A;   IR CT HEAD LTD  05/21/2022   IR PERCUTANEOUS ART THROMBECTOMY/INFUSION INTRACRANIAL INC DIAG ANGIO  05/21/2022   LEFT HEART CATH AND CORONARY ANGIOGRAPHY N/A 10/22/2021   Procedure: LEFT HEART  CATH AND CORONARY ANGIOGRAPHY;  Surgeon: Iran Ouch, MD;  Location: [facility] INVASIVE CV LAB;  Service: Cardiovascular;  Laterality: N/A;   LOOP RECORDER INSERTION N/A 05/23/2022   Procedure: LOOP RECORDER INSERTION;  Surgeon: Lanier Prude, MD;  Location: [facility] INVASIVE CV LAB;  Service: Cardiovascular;  Laterality: N/A;   RADIOLOGY WITH ANESTHESIA N/A 05/20/2022   Procedure: IR WITH ANESTHESIA;  Surgeon: Julieanne Cotton, MD;  Location: [facility] OR;  Service: Radiology;  Laterality: N/A;   ROTATOR CUFF REPAIR     Patient Active Problem List   Diagnosis Date Noted   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 male with PMH: HTN, CAD, HLD, osteoarthritis, SCC, sleep apnea. He presented to the hospital on 05/20/22 with right sided weakness and aphasia with LKW reported by wife to be 10pm on 10/2.  He arrived  at [facility] as code stroke and taken for emergent CT/CTA which demonstrated left M1 occlusion. He received IV tenecteplase and IR thrombectomy due to large vessel occlusion.       DIAGNOSTIC FINDINGS:  MRI 05/22/2022 Cortical ischemia throughout the left MCA territory compatible with recent infarct. Relatively little affect on the white matter. 2. Small focus of acute hemorrhage along the anterior surface of the left temporal lobe Heidelberg classification 3c: Subarachnoid hemorrhage. 3. Normal intracranial MRA.  Restored flow in left MCA.    THERAPY DIAG:  Aphasia  Apraxia  Middle cerebral artery embolism, left  Rationale for  Evaluation and Treatment Rehabilitation  SUBJECTIVE:   PAIN:  Are you having pain? No  PATIENT GOALS: to be able to communicate again  SUBJECTIVE STATEMENT: Pt and his wife continue adding sentences to this Text to Speech app Pt accompanied by: self and significant other  OBJECTIVE:   TODAY'S TREATMENT: Skilled treatment session focused on pt's communication goals. SLP facilitated session by providing the following interventions:   Pt arrived with new phrases on his Text to Speech App   TalkPath Therapy App used to target accurate motor speech movements:  Functional Repetition: Word Level - Level 2 with moderate assistance pt able to produce 15 out of 15 with > 95% accuracy Level 3: with moderate faded to minimal assistance pt able to produce 14 out of 15 with > 95% accuracy Phrase level repetition: Level 1: 15 out of 20 with minimal A to control impulsive productions of incorrect motor patterns.   Pt with improved ability to self-monitor for errors. While he attempts to self-correct, he is impulsive with incorrect productions.   PATIENT EDUCATION: Education details: see above Person educated: Patient and Wife Education method: Programmer, multimedia, Facilities manager, Verbal cues, and Handouts Education comprehension: verbalized understanding  HOME EXERCISE PROGRAM: Instructed wife to provide written phrases (to support verbal imitation of functional phrases)  GOALS: Goals reviewed with patient? Yes  SHORT TERM GOALS: Target date: 10 sessions   UPDATED: 04/16/2023 3.   Pt will improve wording finding abilities by writing 3 or more words during communication exchanges to increase listener understanding with moderate A in 5 out of 7 opportunities; ONGOING;  5. Pt will improve spelling ability by accurately spelling SEMI-COMPLEX words during communication exchanges to increase listener understanding with min A in 5 out of 7 opportunities; ONGOING 6.  Pt will read self-generated words  during communication exchanges with > 50% speech intelligibility given Min A articulatory cues; ONGOING  LONG TERM GOALS: Target date: 06/11/2023     Pt will use multimodal communication to express basic wants/needs in 4 of 10 opportunities.  Baseline: unable Goal status: ONGOING 2.  Pt will initial communication in 3 of 5 opportunities as reported by caregiver outside of ST sessions.   Baseline: no initiation  Goal Status: Initial     ASSESSMENT:  CLINICAL IMPRESSION: Pt is a 77 year old male who was seen today for therapy targeting his aphasia. Pt with improved communication using text to speech app. Two spontaneous complete sentences were observed during today's session. See the above treatment note for details.   OBJECTIVE IMPAIRMENTS include expressive language, receptive language, aphasia, and apraxia. These impairments are limiting patient from managing medications, managing appointments, managing finances, household responsibilities, ADLs/IADLs, and effectively communicating at home and in community. Factors affecting potential to achieve goals and functional outcome are severity of impairments. Patient will benefit from skilled SLP services to address above impairments and improve overall function.  REHAB POTENTIAL: Excellent  PLAN: SLP FREQUENCY: 3x/week  SLP DURATION: 12 weeks  PLANNED INTERVENTIONS: Language facilitation, Environmental controls, Internal/external aids, Functional tasks, Multimodal communication approach, SLP instruction and feedback, Compensatory strategies, and Patient/family education    Kolston Lacount B. Dreama Saa, M.S., CCC-SLP, Tree surgeon Certified Brain Injury Specialist [facility]  [facility] Rehabilitation Services Office [phone] Ascom [phone] Fax [phone]

## 2023-05-16 ENCOUNTER — Ambulatory Visit: Payer: Medicare HMO | Admitting: Speech Pathology

## 2023-05-19 ENCOUNTER — Ambulatory Visit: Payer: Medicare HMO | Admitting: Speech Pathology

## 2023-05-19 DIAGNOSIS — R4701 Aphasia: Secondary | ICD-10-CM | POA: Diagnosis not present

## 2023-05-19 DIAGNOSIS — R482 Apraxia: Secondary | ICD-10-CM | POA: Diagnosis not present

## 2023-05-19 DIAGNOSIS — I6602 Occlusion and stenosis of left middle cerebral artery: Secondary | ICD-10-CM | POA: Diagnosis not present

## 2023-05-19 NOTE — Therapy (Signed)
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 Patient is making progress toward LTGs (has met 1 LTG) and met 1 STGs with great progress towards all STGs. this reporting period. See skilled intervention, clinical impressions, and goals below for details.   END OF SESSION  End of Session - 05/19/23 1023     Visit Number 120    Number of Visits 134    Date for SLP Re-Evaluation 06/11/23    Authorization Type [employer] H[employer]    Authorization Time Period 03/21/2023 thru 07/04/2023    Authorization - Visit Number 18    Authorization - Number of Visits 40    Progress Note Due on Visit 120    SLP Start Time 1015    SLP Stop Time  1100    SLP Time Calculation (min) 45 min    Activity Tolerance Patient tolerated treatment well                   Past Medical History:  Diagnosis Date   CAD (coronary artery disease)    a. 10/2021 NSTEMI/PCI: LM nl, LAD min irregs, LCX 13m (2.5x18 Onyx Frontier DES), OM1 nl, RCA 76m (3.5x26 Onyx Frontier DES), 30d.   Diastolic dysfunction    a. 10/2021 Echo: EF 50-55%, no rwma, Gr1 DD, nl RV fxn. No significant valvular dzs.   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 SCCis. MOHs 07/08/22   Past Surgical History:  Procedure Laterality Date   COLONOSCOPY  N/A 10/09/2015   Procedure: COLONOSCOPY;  Surgeon: Wallace Cullens, MD;  Location: [facility] ENDOSCOPY;  Service: Gastroenterology;  Laterality: N/A;   CORONARY STENT INTERVENTION N/A 10/22/2021   Procedure: CORONARY STENT INTERVENTION;  Surgeon: Iran Ouch, MD;  Location: [facility] INVASIVE CV LAB;  Service: Cardiovascular;  Laterality: N/A;   IR CT HEAD LTD  05/21/2022   IR PERCUTANEOUS ART THROMBECTOMY/INFUSION INTRACRANIAL INC DIAG ANGIO  05/21/2022   LEFT HEART CATH AND CORONARY ANGIOGRAPHY N/A 10/22/2021   Procedure: LEFT HEART CATH AND CORONARY ANGIOGRAPHY;  Surgeon: Iran Ouch, MD;  Location: [facility] INVASIVE CV LAB;  Service: Cardiovascular;  Laterality: N/A;   LOOP RECORDER INSERTION N/A 05/23/2022   Procedure: LOOP RECORDER INSERTION;  Surgeon: Lanier Prude, MD;  Location: [facility] INVASIVE CV LAB;  Service: Cardiovascular;  Laterality: N/A;   RADIOLOGY WITH ANESTHESIA N/A 05/20/2022   Procedure: IR WITH ANESTHESIA;  Surgeon: Julieanne Cotton, MD;  Location: [facility] OR;  Service: Radiology;  Laterality: N/A;   ROTATOR CUFF REPAIR     Patient Active Problem List   Diagnosis Date Noted   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 male with PMH: HTN, CAD, HLD, osteoarthritis, SCC, sleep apnea. He presented to the hospital on 05/20/22 with right sided weakness and aphasia with LKW reported by wife to be 10pm on 10/2.  He arrived at [facility] as code stroke and taken for emergent CT/CTA which demonstrated left M1 occlusion. He received IV tenecteplase and  IR thrombectomy due to large vessel occlusion.       DIAGNOSTIC FINDINGS:  MRI 05/22/2022 Cortical ischemia throughout the left MCA territory compatible with recent infarct. Relatively little affect on the white matter. 2. Small focus of acute hemorrhage along the anterior surface of the left temporal lobe Heidelberg classification 3c: Subarachnoid hemorrhage. 3. Normal intracranial MRA.  Restored flow in left MCA.    THERAPY DIAG:  Aphasia  Apraxia  Middle cerebral artery embolism, left  Rationale for Evaluation and Treatment Rehabilitation  SUBJECTIVE:   PAIN:  Are you having pain? No  PATIENT GOALS: to be able to communicate again  SUBJECTIVE STATEMENT: When entering SLP office, pt pulled up Text to Speech app and read sentence targetinng "[employer]" as he had struggled with "[employer]" during the last session Pt accompanied by: self and significant other  OBJECTIVE:   TODAY'S TREATMENT: Skilled treatment session focused on pt's communication goals. SLP facilitated session by providing the following interventions:  He was trying to tell me something in the car and he reached for the paper and pencil to write down Then on the phone when daughter called during meal pt wrote down "meal time' to idicate that she needed to call back  Pt arrived with new phrases on his Text to Speech App   TalkPath Therapy App used to target accurate motor speech movements:  Functional Repetition: Word Level 2: 15 out of 20 improving to 20 out of 20 with moderate assistance Word Level 3: 16 out of 20 improving to 20 out of 20 with moderate assistance Phrase level repetition: Level 29 out of 20 improving to 20 out of 20 with maximal assistance largely d/t impulsive productions of incorrect motor patterns.   Pt with improved ability to self-monitor for errors. While he attempts to self-correct, he is impulsive with incorrect productions.   PATIENT EDUCATION: Education details: see  above Person educated: Patient and Wife Education method: Programmer, multimedia, Facilities manager, Verbal cues, and Handouts Education comprehension: verbalized understanding  HOME EXERCISE PROGRAM: Instructed wife to provide written phrases (to support verbal imitation of functional phrases)  GOALS: Goals reviewed with patient? Yes  SHORT TERM GOALS: Target date: 10 sessions   UPDATED: 04/16/2023   UPDATED: 05/19/2023 3.   Pt will improve wording finding abilities by writing 3 or more words during communication exchanges to increase listener understanding with moderate A in 5 out of 7 opportunities; ONGOING;   05/19/2023 - PROGRESS MADE - currently writing down 2-3 words with moderate assistance in 3 of 7 opportunities 5. Pt will improve spelling ability by accurately spelling SEMI-COMPLEX words during communication exchanges to increase listener understanding with min A in 5 out of 7 opportunities; ONGOING  05/19/2023 - PROGRESS MADE - currently working at [employer] A level in 3 out of 7 opportunities 6.  Pt will read self-generated words during communication exchanges with > 50% speech intelligibility given Min  A articulatory cues; ONGOING  05/19/2023 - MET - upgraded to Pt will read self-generated words during communication exchanges with > 75% speech intelligibility given Min A articulatory cues  LONG TERM GOALS: Target date: 06/11/2023     Pt will use multimodal communication to express basic wants/needs in 4 of 10 opportunities.  Baseline: unable Goal status: ONGOING; MET when using combination of verbal, paper and pencil and Text to Speech App 05/19/2023 - Upgraded to Pt will use multimodal communication to express basic wants/needs in 7 out of 10 opportunities.   2.  Pt will initiate communication in 3 of 5 opportunities as reported by caregiver outside of ST sessions.   Baseline: no initiation  Goal Status: Initial: ONGOING; his wife reports mild increase in attempts    ASSESSMENT:  CLINICAL  IMPRESSION: Pt is a 77 year old male who was seen today for therapy targeting his aphasia. Pt with improved communication using text to speech app. Pt continues to progress towards all goals with most noticeable improvement in the accuracy of including initial syllables and initial word within phrases.  See the above treatment note for details.   OBJECTIVE IMPAIRMENTS include expressive language, receptive language, aphasia, and apraxia. These impairments are limiting patient from managing medications, managing appointments, managing finances, household responsibilities, ADLs/IADLs, and effectively communicating at home and in community. Factors affecting potential to achieve goals and functional outcome are severity of impairments. Patient will benefit from skilled SLP services to address above impairments and improve overall function.  REHAB POTENTIAL: Excellent  PLAN: SLP FREQUENCY: 3x/week  SLP DURATION: 12 weeks  PLANNED INTERVENTIONS: Language facilitation, Environmental controls, Internal/external aids, Functional tasks, Multimodal communication approach, SLP instruction and feedback, Compensatory strategies, and Patient/family education    Byrd Terrero B. Dreama Saa, M.S., CCC-SLP, Tree surgeon Certified Brain Injury Specialist [facility]  [facility] Rehabilitation Services Office [phone] Ascom [phone] Fax [phone]

## 2023-05-21 ENCOUNTER — Ambulatory Visit: Payer: Medicare HMO | Admitting: Speech Pathology

## 2023-05-21 ENCOUNTER — Ambulatory Visit: Payer: Medicare HMO | Attending: Internal Medicine | Admitting: Speech Pathology

## 2023-05-21 DIAGNOSIS — R4701 Aphasia: Secondary | ICD-10-CM | POA: Insufficient documentation

## 2023-05-21 DIAGNOSIS — R482 Apraxia: Secondary | ICD-10-CM | POA: Insufficient documentation

## 2023-05-21 DIAGNOSIS — I6602 Occlusion and stenosis of left middle cerebral artery: Secondary | ICD-10-CM | POA: Diagnosis not present

## 2023-05-21 NOTE — Therapy (Signed)
OUTPATIENT SPEECH LANGUAGE PATHOLOGY TREATMENT NOTE   Patient Name: Alan Stewart MRN: [id number] DOB:1946/02/03, 77 y.o., male 68 Date: 05/21/2023  PCP: Daniel Nones, MD REFERRING PROVIDER: Daniel Nones, MD   END OF SESSION  End of Session - 05/21/23 1016     Visit Number 121    Number of Visits 134    Date for SLP Re-Evaluation 06/11/23    Authorization Type [employer] H[employer]    Authorization Time Period 03/21/2023 thru 07/04/2023    Authorization - Visit Number 19    Authorization - Number of Visits 40    Progress Note Due on Visit 120    SLP Start Time 1015    SLP Stop Time  1100    SLP Time Calculation (min) 45 min    Activity Tolerance Patient tolerated treatment well                   Past Medical History:  Diagnosis Date   CAD (coronary artery disease)    a. 10/2021 NSTEMI/PCI: LM nl, LAD min irregs, LCX 86m (2.5x18 Onyx Frontier DES), OM1 nl, RCA 15m (3.5x26 Onyx Frontier DES), 30d.   Diastolic dysfunction    a. 10/2021 Echo: EF 50-55%, no rwma, Gr1 DD, nl RV fxn. No significant valvular dzs.   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 SCCis. MOHs 07/08/22   Past Surgical History:  Procedure Laterality Date   COLONOSCOPY N/A 10/09/2015   Procedure: COLONOSCOPY;  Surgeon: Wallace Cullens, MD;  Location: [facility] ENDOSCOPY;  Service: Gastroenterology;  Laterality: N/A;   CORONARY STENT INTERVENTION N/A 10/22/2021   Procedure: CORONARY STENT INTERVENTION;  Surgeon: Iran Ouch, MD;  Location: [facility] INVASIVE CV LAB;  Service: Cardiovascular;  Laterality: N/A;   IR CT HEAD LTD  05/21/2022   IR PERCUTANEOUS ART THROMBECTOMY/INFUSION INTRACRANIAL INC DIAG ANGIO  05/21/2022   LEFT HEART CATH AND CORONARY ANGIOGRAPHY N/A 10/22/2021   Procedure: LEFT HEART  CATH AND CORONARY ANGIOGRAPHY;  Surgeon: Iran Ouch, MD;  Location: [facility] INVASIVE CV LAB;  Service: Cardiovascular;  Laterality: N/A;   LOOP RECORDER INSERTION N/A 05/23/2022   Procedure: LOOP RECORDER INSERTION;  Surgeon: Lanier Prude, MD;  Location: [facility] INVASIVE CV LAB;  Service: Cardiovascular;  Laterality: N/A;   RADIOLOGY WITH ANESTHESIA N/A 05/20/2022   Procedure: IR WITH ANESTHESIA;  Surgeon: Julieanne Cotton, MD;  Location: [facility] OR;  Service: Radiology;  Laterality: N/A;   ROTATOR CUFF REPAIR     Patient Active Problem List   Diagnosis Date Noted   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 male with PMH: HTN, CAD, HLD, osteoarthritis, SCC, sleep apnea. He presented to the hospital on 05/20/22 with right sided weakness and aphasia with LKW reported by wife to be 10pm on 10/2.  He arrived  at [facility] as code stroke and taken for emergent CT/CTA which demonstrated left M1 occlusion. He received IV tenecteplase and IR thrombectomy due to large vessel occlusion.       DIAGNOSTIC FINDINGS:  MRI 05/22/2022 Cortical ischemia throughout the left MCA territory compatible with recent infarct. Relatively little affect on the white matter. 2. Small focus of acute hemorrhage along the anterior surface of the left temporal lobe Heidelberg classification 3c: Subarachnoid hemorrhage. 3. Normal intracranial MRA.  Restored flow in left MCA.    THERAPY DIAG:  Aphasia  Apraxia  Middle cerebral artery embolism, left  Rationale for  Evaluation and Treatment Rehabilitation  SUBJECTIVE:   PAIN:  Are you having pain? No  PATIENT GOALS: to be able to communicate again  SUBJECTIVE STATEMENT: Pt's wife reports decrease practice as pt has appeared "blue" despite progress over the last several sessions Pt accompanied by: self and significant other  OBJECTIVE:   TODAY'S TREATMENT: Skilled treatment session focused on pt's communication goals. SLP facilitated session by providing the following interventions:   Speech intelligibility using the phrases that pt has starred in his Text to Speech App - pt with decreased speech intelligibility and ability to read his list of functional phrases (suspect this is d/t lack of progress) - as a result, he required max faded moderate hierarchical cuing to improve speech intelligibility (not only improvement of motor patterns but he also required cues to include initial words of sentences and syllables). He produced 2 out of 7 utterances with > 90% speech intelligibility.   While pt continues to complete word searches, SLP introduced JUMBLES to improve spelling and language production. Pt completed basic 3-4 letter word JUMBLES with > 80% accuracy. SLP also prompted pt to enter words into Text to Speech App for practice of of imitating accurate motor patterns.    PATIENT EDUCATION: Education details: see above Person educated: Patient and Wife Education method: Programmer, multimedia, Facilities manager, Verbal cues, and Handouts Education comprehension: verbalized understanding  HOME EXERCISE PROGRAM: Instructed wife to provide written phrases (to support verbal imitation of functional phrases) JUMBLES Practice functional phrases contained in Text to Speech App  GOALS: Goals reviewed with patient? Yes  SHORT TERM GOALS: Target date: 10 sessions   UPDATED: 04/16/2023   UPDATED: 05/19/2023 3.   Pt will improve wording finding abilities by writing 3 or more words during communication exchanges  to increase listener understanding with moderate A in 5 out of 7 opportunities; ONGOING;   05/19/2023 - PROGRESS MADE - currently writing down 2-3 words with moderate assistance in 3 of 7 opportunities 5. Pt will improve spelling ability by accurately spelling SEMI-COMPLEX words during communication exchanges to increase listener understanding with min A in 5 out of 7 opportunities; ONGOING  05/19/2023 - PROGRESS MADE - currently working at [employer] A level in 3 out of 7 opportunities 6.  Pt will read self-generated words during communication exchanges with > 50% speech intelligibility given Min A articulatory cues; ONGOING  05/19/2023 - MET - upgraded to Pt will read self-generated words during communication exchanges with > 75% speech intelligibility given Min A articulatory cues  LONG TERM GOALS: Target date: 06/11/2023     Pt will use multimodal communication to express basic wants/needs in 4 of 10 opportunities.  Baseline: unable Goal status: ONGOING; MET when using combination of verbal, paper and pencil and Text to Speech App 05/19/2023 - Upgraded to Pt will use multimodal communication to express basic wants/needs in 7 out of 10 opportunities.  2.  Pt will initiate communication in 3 of 5 opportunities as reported by caregiver outside of ST sessions.   Baseline: no initiation  Goal Status: Initial: ONGOING; his wife reports mild increase in attempts    ASSESSMENT:  CLINICAL IMPRESSION: Pt is a 77 year old male who was seen today for therapy targeting his aphasia. Pt with improved communication using text to speech app.  Pt with decreased speech intelligibility today, suspect related to decreased practice outside of therapy. See the above treatment note for details.   OBJECTIVE IMPAIRMENTS include expressive language, receptive language, aphasia, and apraxia. These impairments are limiting patient from managing medications, managing appointments, managing finances, household  responsibilities, ADLs/IADLs, and effectively communicating at home and in community. Factors affecting potential to achieve goals and functional outcome are severity of impairments. Patient will benefit from skilled SLP services to address above impairments and improve overall function.  REHAB POTENTIAL: Excellent  PLAN: SLP FREQUENCY: 3x/week  SLP DURATION: 12 weeks  PLANNED INTERVENTIONS: Language facilitation, Environmental controls, Internal/external aids, Functional tasks, Multimodal communication approach, SLP instruction and feedback, Compensatory strategies, and Patient/family education    Braiden Rodman B. Dreama Saa, M.S., CCC-SLP, Tree surgeon Certified Brain Injury Specialist [facility]  [facility] Rehabilitation Services Office [phone] Ascom [phone] Fax [phone]

## 2023-05-23 ENCOUNTER — Ambulatory Visit: Payer: Medicare HMO | Admitting: Speech Pathology

## 2023-05-23 DIAGNOSIS — I6602 Occlusion and stenosis of left middle cerebral artery: Secondary | ICD-10-CM | POA: Diagnosis not present

## 2023-05-23 DIAGNOSIS — R4701 Aphasia: Secondary | ICD-10-CM

## 2023-05-23 DIAGNOSIS — R482 Apraxia: Secondary | ICD-10-CM | POA: Diagnosis not present

## 2023-05-23 NOTE — Therapy (Unsigned)
OUTPATIENT SPEECH LANGUAGE PATHOLOGY TREATMENT NOTE   Patient Name: LENNOX DOLBERRY MRN: [id number] DOB:[birth year], 77 y.o., male 80 Date: 05/23/2023  PCP: Daniel Nones, MD REFERRING PROVIDER: Daniel Nones, MD   END OF SESSION  End of Session - 05/23/23 1040     Visit Number 122    Number of Visits 134    Date for SLP Re-Evaluation 06/11/23    Authorization Type [employer] H[employer]    Authorization Time Period 03/21/2023 thru 07/04/2023    Authorization - Visit Number 20    Authorization - Number of Visits 40    Progress Note Due on Visit 120    SLP Start Time 1015    SLP Stop Time  1100    SLP Time Calculation (min) 45 min    Activity Tolerance Patient tolerated treatment well                   Past Medical History:  Diagnosis Date   CAD (coronary artery disease)    a. 10/2021 NSTEMI/PCI: LM nl, LAD min irregs, LCX 29m (2.5x18 Onyx Frontier DES), OM1 nl, RCA 31m (3.5x26 Onyx Frontier DES), 30d.   Diastolic dysfunction    a. 10/2021 Echo: EF 50-55%, no rwma, Gr1 DD, nl RV fxn. No significant valvular dzs.   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 SCCis. MOHs 07/08/22   Past Surgical History:  Procedure Laterality Date   COLONOSCOPY N/A 10/09/2015   Procedure: COLONOSCOPY;  Surgeon: Wallace Cullens, MD;  Location: [facility] ENDOSCOPY;  Service: Gastroenterology;  Laterality: N/A;   CORONARY STENT INTERVENTION N/A 10/22/2021   Procedure: CORONARY STENT INTERVENTION;  Surgeon: Iran Ouch, MD;  Location: [facility] INVASIVE CV LAB;  Service: Cardiovascular;  Laterality: N/A;   IR CT HEAD LTD  05/21/2022   IR PERCUTANEOUS ART THROMBECTOMY/INFUSION INTRACRANIAL INC DIAG ANGIO  05/21/2022   LEFT HEART CATH AND CORONARY ANGIOGRAPHY N/A 10/22/2021   Procedure: LEFT HEART  CATH AND CORONARY ANGIOGRAPHY;  Surgeon: Iran Ouch, MD;  Location: [facility] INVASIVE CV LAB;  Service: Cardiovascular;  Laterality: N/A;   LOOP RECORDER INSERTION N/A 05/23/2022   Procedure: LOOP RECORDER INSERTION;  Surgeon: Lanier Prude, MD;  Location: [facility] INVASIVE CV LAB;  Service: Cardiovascular;  Laterality: N/A;   RADIOLOGY WITH ANESTHESIA N/A 05/20/2022   Procedure: IR WITH ANESTHESIA;  Surgeon: Julieanne Cotton, MD;  Location: [facility] OR;  Service: Radiology;  Laterality: N/A;   ROTATOR CUFF REPAIR     Patient Active Problem List   Diagnosis Date Noted   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 male with PMH: HTN, CAD, HLD, osteoarthritis, SCC, sleep apnea. He presented to the hospital on 05/20/22 with right sided weakness and aphasia with LKW reported by wife to be 10pm on 10/2.  He arrived  at [facility] as code stroke and taken for emergent CT/CTA which demonstrated left M1 occlusion. He received IV tenecteplase and IR thrombectomy due to large vessel occlusion.       DIAGNOSTIC FINDINGS:  MRI 05/22/2022 Cortical ischemia throughout the left MCA territory compatible with recent infarct. Relatively little affect on the white matter. 2. Small focus of acute hemorrhage along the anterior surface of the left temporal lobe Heidelberg classification 3c: Subarachnoid hemorrhage. 3. Normal intracranial MRA.  Restored flow in left MCA.    THERAPY DIAG:  Aphasia  Apraxia  Middle cerebral artery embolism, left  Rationale for  Evaluation and Treatment Rehabilitation  SUBJECTIVE:   PAIN:  Are you having pain? No  PATIENT GOALS: to be able to communicate again  SUBJECTIVE STATEMENT: Pt's wife reports decrease practice as pt has appeared "blue" despite progress over the last several sessions Pt accompanied by: self and significant other  OBJECTIVE:   TODAY'S TREATMENT: Skilled treatment session focused on pt's communication goals. SLP facilitated session by providing the following interventions:   Speech intelligibility using the phrases that pt has starred in his Text to Speech App - pt with decreased speech intelligibility and ability to read his list of functional phrases (suspect this is d/t lack of progress) - as a result, he required max faded moderate hierarchical cuing to improve speech intelligibility (not only improvement of motor patterns but he also required cues to include initial words of sentences and syllables). He produced 2 out of 7 utterances with > 90% speech intelligibility.   While pt continues to complete word searches, SLP introduced JUMBLES to improve spelling and language production. Pt completed basic 3-4 letter word JUMBLES with > 80% accuracy. SLP also prompted pt to enter words into Text to Speech App for practice of of imitating accurate motor patterns.    PATIENT EDUCATION: Education details: see above Person educated: Patient and Wife Education method: Programmer, multimedia, Facilities manager, Verbal cues, and Handouts Education comprehension: verbalized understanding  HOME EXERCISE PROGRAM: Instructed wife to provide written phrases (to support verbal imitation of functional phrases) JUMBLES Practice functional phrases contained in Text to Speech App  GOALS: Goals reviewed with patient? Yes  SHORT TERM GOALS: Target date: 10 sessions   UPDATED: 04/16/2023   UPDATED: 05/19/2023 3.   Pt will improve wording finding abilities by writing 3 or more words during communication exchanges  to increase listener understanding with moderate A in 5 out of 7 opportunities; ONGOING;   05/19/2023 - PROGRESS MADE - currently writing down 2-3 words with moderate assistance in 3 of 7 opportunities 5. Pt will improve spelling ability by accurately spelling SEMI-COMPLEX words during communication exchanges to increase listener understanding with min A in 5 out of 7 opportunities; ONGOING  05/19/2023 - PROGRESS MADE - currently working at [employer] A level in 3 out of 7 opportunities 6.  Pt will read self-generated words during communication exchanges with > 50% speech intelligibility given Min A articulatory cues; ONGOING  05/19/2023 - MET - upgraded to Pt will read self-generated words during communication exchanges with > 75% speech intelligibility given Min A articulatory cues  LONG TERM GOALS: Target date: 06/11/2023     Pt will use multimodal communication to express basic wants/needs in 4 of 10 opportunities.  Baseline: unable Goal status: ONGOING; MET when using combination of verbal, paper and pencil and Text to Speech App 05/19/2023 - Upgraded to Pt will use multimodal communication to express basic wants/needs in 7 out of 10 opportunities.  2.  Pt will initiate communication in 3 of 5 opportunities as reported by caregiver outside of ST sessions.   Baseline: no initiation  Goal Status: Initial: ONGOING; his wife reports mild increase in attempts    ASSESSMENT:  CLINICAL IMPRESSION: Pt is a 77 year old male who was seen today for therapy targeting his aphasia. Pt with improved communication using text to speech app.  Pt with decreased speech intelligibility today, suspect related to decreased practice outside of therapy. See the above treatment note for details.   OBJECTIVE IMPAIRMENTS include expressive language, receptive language, aphasia, and apraxia. These impairments are limiting patient from managing medications, managing appointments, managing finances, household  responsibilities, ADLs/IADLs, and effectively communicating at home and in community. Factors affecting potential to achieve goals and functional outcome are severity of impairments. Patient will benefit from skilled SLP services to address above impairments and improve overall function.  REHAB POTENTIAL: Excellent  PLAN: SLP FREQUENCY: 3x/week  SLP DURATION: 12 weeks  PLANNED INTERVENTIONS: Language facilitation, Environmental controls, Internal/external aids, Functional tasks, Multimodal communication approach, SLP instruction and feedback, Compensatory strategies, and Patient/family education    Ashly Yepez B. Dreama Saa, M.S., CCC-SLP, Tree surgeon Certified Brain Injury Specialist [facility]  [facility] Rehabilitation Services Office [phone] Ascom [phone] Fax [phone]

## 2023-05-26 ENCOUNTER — Ambulatory Visit: Payer: Medicare HMO | Admitting: Speech Pathology

## 2023-05-26 DIAGNOSIS — R482 Apraxia: Secondary | ICD-10-CM

## 2023-05-26 DIAGNOSIS — I6602 Occlusion and stenosis of left middle cerebral artery: Secondary | ICD-10-CM

## 2023-05-26 DIAGNOSIS — R4701 Aphasia: Secondary | ICD-10-CM | POA: Diagnosis not present

## 2023-05-26 NOTE — Therapy (Deleted)
OUTPATIENT SPEECH LANGUAGE PATHOLOGY TREATMENT NOTE   Patient Name: Alan Stewart MRN: [id number] DOB:July 23, 1946, 77 y.o., male 46 Date: 05/26/2023  PCP: Daniel Nones, MD REFERRING PROVIDER: Daniel Nones, MD   END OF SESSION  End of Session - 05/26/23 1029     Visit Number 123    Number of Visits 134    Date for SLP Re-Evaluation 06/11/23    Authorization Type [employer] H[employer]    Authorization Time Period 03/21/2023 thru 07/04/2023    Authorization - Visit Number 21    Authorization - Number of Visits 40    Progress Note Due on Visit 130    SLP Start Time 1015    SLP Stop Time  1100    SLP Time Calculation (min) 45 min    Activity Tolerance Patient tolerated treatment well                   Past Medical History:  Diagnosis Date   CAD (coronary artery disease)    a. 10/2021 NSTEMI/PCI: LM nl, LAD min irregs, LCX 62m (2.5x18 Onyx Frontier DES), OM1 nl, RCA 63m (3.5x26 Onyx Frontier DES), 30d.   Diastolic dysfunction    a. 10/2021 Echo: EF 50-55%, no rwma, Gr1 DD, nl RV fxn. No significant valvular dzs.   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 SCCis. MOHs 07/08/22   Past Surgical History:  Procedure Laterality Date   COLONOSCOPY N/A 10/09/2015   Procedure: COLONOSCOPY;  Surgeon: Wallace Cullens, MD;  Location: [facility] ENDOSCOPY;  Service: Gastroenterology;  Laterality: N/A;   CORONARY STENT INTERVENTION N/A 10/22/2021   Procedure: CORONARY STENT INTERVENTION;  Surgeon: Iran Ouch, MD;  Location: [facility] INVASIVE CV LAB;  Service: Cardiovascular;  Laterality: N/A;   IR CT HEAD LTD  05/21/2022   IR PERCUTANEOUS ART THROMBECTOMY/INFUSION INTRACRANIAL INC DIAG ANGIO  05/21/2022   LEFT HEART CATH AND CORONARY ANGIOGRAPHY N/A 10/22/2021   Procedure: LEFT HEART  CATH AND CORONARY ANGIOGRAPHY;  Surgeon: Iran Ouch, MD;  Location: [facility] INVASIVE CV LAB;  Service: Cardiovascular;  Laterality: N/A;   LOOP RECORDER INSERTION N/A 05/23/2022   Procedure: LOOP RECORDER INSERTION;  Surgeon: Lanier Prude, MD;  Location: [facility] INVASIVE CV LAB;  Service: Cardiovascular;  Laterality: N/A;   RADIOLOGY WITH ANESTHESIA N/A 05/20/2022   Procedure: IR WITH ANESTHESIA;  Surgeon: Julieanne Cotton, MD;  Location: [facility] OR;  Service: Radiology;  Laterality: N/A;   ROTATOR CUFF REPAIR     Patient Active Problem List   Diagnosis Date Noted   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 male with PMH: HTN, CAD, HLD, osteoarthritis, SCC, sleep apnea. He presented to the hospital on 05/20/22 with right sided weakness and aphasia with LKW reported by wife to be 10pm on 10/2.  He arrived  at [facility] as code stroke and taken for emergent CT/CTA which demonstrated left M1 occlusion. He received IV tenecteplase and IR thrombectomy due to large vessel occlusion.       DIAGNOSTIC FINDINGS:  MRI 05/22/2022 Cortical ischemia throughout the left MCA territory compatible with recent infarct. Relatively little affect on the white matter. 2. Small focus of acute hemorrhage along the anterior surface of the left temporal lobe Heidelberg classification 3c: Subarachnoid hemorrhage. 3. Normal intracranial MRA.  Restored flow in left MCA.    THERAPY DIAG:  Aphasia  Apraxia  Middle cerebral artery embolism, left  Rationale for  Evaluation and Treatment Rehabilitation  SUBJECTIVE:   PAIN:  Are you having pain? No  PATIENT GOALS: to be able to communicate again  SUBJECTIVE STATEMENT: Pt's wife reports decrease practice as pt has appeared "blue" despite progress over the last several sessions Pt accompanied by: self and significant other  OBJECTIVE:   TODAY'S TREATMENT: Skilled treatment session focused on pt's communication goals. SLP facilitated session by providing the following interventions:   Speech intelligibility using the phrases that pt has starred in his Text to Speech App - Pt with improved ability to imitate functional phrases in 7 out of 10 opportunities  TalkPath Therapy App utilized to target motor patterns and improve speech intelligibility Word Level - Level 3 - 75% with moderate cues  PATIENT EDUCATION: Education details: see above Person educated: Patient and Wife Education method: Programmer, multimedia, Facilities manager, Verbal cues, and Handouts Education comprehension: verbalized understanding  HOME EXERCISE PROGRAM: Instructed wife to provide written phrases (to support verbal imitation of functional phrases) JUMBLES Practice functional phrases contained in Text to Speech App  GOALS: Goals reviewed with patient? Yes  SHORT TERM GOALS: Target date: 10 sessions   UPDATED: 04/16/2023   UPDATED: 05/19/2023 3.   Pt will improve wording finding abilities by writing 3 or more words during communication exchanges to increase listener understanding with moderate A in 5 out of 7 opportunities; ONGOING;   05/19/2023 - PROGRESS MADE - currently writing down 2-3 words with moderate assistance in 3 of 7 opportunities 5. Pt will improve spelling ability by accurately spelling SEMI-COMPLEX words during communication exchanges to increase listener understanding with min A in 5 out of 7 opportunities; ONGOING  05/19/2023 - PROGRESS MADE - currently working at [employer] A level in 3 out of 7 opportunities 6.  Pt  will read self-generated words during communication exchanges with > 50% speech intelligibility given Min A articulatory cues; ONGOING  05/19/2023 - MET - upgraded to Pt will read self-generated words during communication exchanges with > 75% speech intelligibility given Min A articulatory cues  LONG TERM GOALS: Target date: 06/11/2023     Pt will use multimodal communication to express basic wants/needs in 4 of 10 opportunities.  Baseline: unable Goal status: ONGOING; MET when using combination of verbal, paper and pencil and Text to Speech App 05/19/2023 - Upgraded to Pt will use multimodal communication to express basic wants/needs in 7 out of 10 opportunities.   2.  Pt will initiate communication in 3 of 5 opportunities as reported by caregiver outside of ST sessions.   Baseline: no initiation  Goal Status: Initial: ONGOING; his wife reports mild increase in attempts    ASSESSMENT:  CLINICAL IMPRESSION: Pt is a 77 year old male who was seen today for therapy targeting his aphasia. Pt with improved communication using text to speech app.  Pt with decreased speech intelligibility today, suspect related to decreased practice outside  of therapy. See the above treatment note for details.   OBJECTIVE IMPAIRMENTS include expressive language, receptive language, aphasia, and apraxia. These impairments are limiting patient from managing medications, managing appointments, managing finances, household responsibilities, ADLs/IADLs, and effectively communicating at home and in community. Factors affecting potential to achieve goals and functional outcome are severity of impairments. Patient will benefit from skilled SLP services to address above impairments and improve overall function.  REHAB POTENTIAL: Excellent  PLAN: SLP FREQUENCY: 3x/week  SLP DURATION: 12 weeks  PLANNED INTERVENTIONS: Language facilitation, Environmental controls, Internal/external aids, Functional tasks, Multimodal  communication approach, SLP instruction and feedback, Compensatory strategies, and Patient/family education    Shaniya Tashiro B. Dreama Saa, M.S., CCC-SLP, Tree surgeon Certified Brain Injury Specialist [facility]  [facility] Rehabilitation Services Office [phone] Ascom [phone] Fax [phone]

## 2023-05-26 NOTE — Therapy (Signed)
OUTPATIENT SPEECH LANGUAGE PATHOLOGY TREATMENT NOTE   Patient Name: Alan Stewart MRN: [id number] DOB:[birth year], 77 y.o., male 44 Date: 05/26/2023  PCP: Daniel Nones, MD REFERRING PROVIDER: Daniel Nones, MD   END OF SESSION  End of Session - 05/26/23 1029     Visit Number 123    Number of Visits 134    Date for SLP Re-Evaluation 06/11/23    Authorization Type [employer]    Authorization Time Period 03/21/2023 thru 07/04/2023    Authorization - Visit Number 21    Authorization - Number of Visits 40    Progress Note Due on Visit 130    SLP Start Time 1015    SLP Stop Time  1100    SLP Time Calculation (min) 45 min    Activity Tolerance Patient tolerated treatment well                   Past Medical History:  Diagnosis Date   CAD (coronary artery disease)    a. 10/2021 NSTEMI/PCI: LM nl, LAD min irregs, LCX 15m (2.5x18 Onyx Frontier DES), OM1 nl, RCA 43m (3.5x26 Onyx Frontier DES), 30d.   Diastolic dysfunction    a. 10/2021 Echo: EF 50-55%, no rwma, Gr1 DD, nl RV fxn. No significant valvular dzs.   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 SCCis. MOHs 07/08/22   Past Surgical History:  Procedure Laterality Date   COLONOSCOPY N/A 10/09/2015   Procedure: COLONOSCOPY;  Surgeon: Wallace Cullens, MD;  Location: [facility] ENDOSCOPY;  Service: Gastroenterology;  Laterality: N/A;   CORONARY STENT INTERVENTION N/A 10/22/2021   Procedure: CORONARY STENT INTERVENTION;  Surgeon: Iran Ouch, MD;  Location: [facility] INVASIVE CV LAB;  Service: Cardiovascular;  Laterality: N/A;   IR CT HEAD LTD  05/21/2022   IR PERCUTANEOUS ART THROMBECTOMY/INFUSION INTRACRANIAL INC DIAG ANGIO  05/21/2022   LEFT HEART CATH AND CORONARY ANGIOGRAPHY N/A 10/22/2021   Procedure: LEFT HEART  CATH AND CORONARY ANGIOGRAPHY;  Surgeon: Iran Ouch, MD;  Location: [facility] INVASIVE CV LAB;  Service: Cardiovascular;  Laterality: N/A;   LOOP RECORDER INSERTION N/A 05/23/2022   Procedure: LOOP RECORDER INSERTION;  Surgeon: Lanier Prude, MD;  Location: [facility] INVASIVE CV LAB;  Service: Cardiovascular;  Laterality: N/A;   RADIOLOGY WITH ANESTHESIA N/A 05/20/2022   Procedure: IR WITH ANESTHESIA;  Surgeon: Julieanne Cotton, MD;  Location: [facility] OR;  Service: Radiology;  Laterality: N/A;   ROTATOR CUFF REPAIR     Patient Active Problem List   Diagnosis Date Noted   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 male with PMH: HTN, CAD, HLD, osteoarthritis, SCC, sleep apnea. He presented to the hospital on 05/20/22 with right sided weakness and aphasia with LKW reported by wife to be 10pm on 10/2.  He arrived  at [facility] as code stroke and taken for emergent CT/CTA which demonstrated left M1 occlusion. He received IV tenecteplase and IR thrombectomy due to large vessel occlusion.       DIAGNOSTIC FINDINGS:  MRI 05/22/2022 Cortical ischemia throughout the left MCA territory compatible with recent infarct. Relatively little affect on the white matter. 2. Small focus of acute hemorrhage along the anterior surface of the left temporal lobe Heidelberg classification 3c: Subarachnoid hemorrhage. 3. Normal intracranial MRA.  Restored flow in left MCA.    THERAPY DIAG:  Aphasia  Apraxia  Middle cerebral artery embolism, left  Rationale for  Evaluation and Treatment Rehabilitation  SUBJECTIVE:   PAIN:  Are you having pain? No  PATIENT GOALS: to be able to communicate again  SUBJECTIVE STATEMENT: Pt's wife reports that his son called him Pt accompanied by: self and significant other  OBJECTIVE:   TODAY'S TREATMENT: Skilled treatment session focused on pt's communication goals. SLP facilitated session by providing the following interventions:   Speech intelligibility using the phrases that pt has starred in his Text to Speech App - Pt with improved ability to imitate functional phrases in 7 out of 11 opportunities with rare Min A  TalkPath Therapy App utilized to target motor patterns and improve speech intelligibility Functional Repetition - Phrase Level 1 - 14 out of 20 improving to 20 out of 20 with Rare Min A; additional set 14 out of 20 improving to 20 out of 20 with Rare Min A Functional Repetition - Phrase Level 2 - 12 out of 20 with Min A Functional Repetition - Phrase Level 3 - moderate faded to Min A to achieve 10 out of 20  PATIENT EDUCATION: Education details: see above Person educated: Patient and Wife Education method: Programmer, multimedia, Facilities manager, Verbal cues, and Handouts Education comprehension: verbalized understanding  HOME EXERCISE PROGRAM: Instructed wife to provide written phrases (to support verbal imitation of functional phrases) JUMBLES Practice functional phrases contained in Text to Speech App  GOALS: Goals reviewed with patient? Yes  SHORT TERM GOALS: Target date: 10 sessions   UPDATED: 04/16/2023   UPDATED: 05/19/2023 3.   Pt will improve wording finding abilities by writing 3 or more words during communication exchanges to increase listener understanding with moderate A in 5 out of 7 opportunities; ONGOING;   05/19/2023 - PROGRESS MADE - currently writing down 2-3 words with moderate assistance in 3 of 7 opportunities 5. Pt will improve spelling ability by accurately spelling  SEMI-COMPLEX words during communication exchanges to increase listener understanding with min A in 5 out of 7 opportunities; ONGOING  05/19/2023 - PROGRESS MADE - currently working at [employer] A level in 3 out of 7 opportunities 6.  Pt will read self-generated words during communication exchanges with > 50% speech intelligibility given Min A articulatory cues; ONGOING  05/19/2023 - MET - upgraded to Pt will read self-generated words during communication exchanges with > 75% speech intelligibility given Min A articulatory cues  LONG TERM GOALS: Target date: 06/11/2023     Pt will use multimodal communication to express basic wants/needs in 4 of 10 opportunities.  Baseline: unable Goal status: ONGOING; MET when using combination of verbal, paper and pencil and Text to Speech App 05/19/2023 - Upgraded to Pt will use multimodal communication to express basic wants/needs in 7 out of 10 opportunities.   2.  Pt will initiate communication in 3 of 5 opportunities as reported by caregiver outside of ST sessions.   Baseline: no initiation  Goal Status:  Initial: ONGOING; his wife reports mild increase in attempts    ASSESSMENT:  CLINICAL IMPRESSION: Pt is a 77 year old male who was seen today for therapy targeting his aphasia. Pt with improved communication using text to speech app.  Pt's wife states that pt's son called and they had "quit" the conversation. "I haven't hear him speak that many words before." Pt also with increased ability to produce accurate motor patterns during today's functional speech tasks.    See the above treatment note for details.   OBJECTIVE IMPAIRMENTS include expressive language, receptive language, aphasia, and apraxia. These impairments are limiting patient from managing medications, managing appointments, managing finances, household responsibilities, ADLs/IADLs, and effectively communicating at home and in community. Factors affecting potential to achieve goals and  functional outcome are severity of impairments. Patient will benefit from skilled SLP services to address above impairments and improve overall function.  REHAB POTENTIAL: Excellent  PLAN: SLP FREQUENCY: 3x/week  SLP DURATION: 12 weeks  PLANNED INTERVENTIONS: Language facilitation, Environmental controls, Internal/external aids, Functional tasks, Multimodal communication approach, SLP instruction and feedback, Compensatory strategies, and Patient/family education    Blakelynn Scheeler B. Dreama Saa, M.S., CCC-SLP, Tree surgeon Certified Brain Injury Specialist [facility]  [facility] Rehabilitation Services Office [phone] Ascom [phone] Fax [phone]

## 2023-05-27 DIAGNOSIS — G4733 Obstructive sleep apnea (adult) (pediatric): Secondary | ICD-10-CM | POA: Diagnosis not present

## 2023-05-27 DIAGNOSIS — E039 Hypothyroidism, unspecified: Secondary | ICD-10-CM | POA: Diagnosis not present

## 2023-05-27 DIAGNOSIS — E7849 Other hyperlipidemia: Secondary | ICD-10-CM | POA: Diagnosis not present

## 2023-05-27 DIAGNOSIS — E1165 Type 2 diabetes mellitus with hyperglycemia: Secondary | ICD-10-CM | POA: Diagnosis not present

## 2023-05-27 DIAGNOSIS — I2510 Atherosclerotic heart disease of native coronary artery without angina pectoris: Secondary | ICD-10-CM | POA: Diagnosis not present

## 2023-05-28 ENCOUNTER — Ambulatory Visit: Payer: Medicare HMO | Admitting: Speech Pathology

## 2023-05-28 DIAGNOSIS — R482 Apraxia: Secondary | ICD-10-CM

## 2023-05-28 DIAGNOSIS — I6602 Occlusion and stenosis of left middle cerebral artery: Secondary | ICD-10-CM

## 2023-05-28 DIAGNOSIS — R4701 Aphasia: Secondary | ICD-10-CM | POA: Diagnosis not present

## 2023-05-28 NOTE — Therapy (Signed)
OUTPATIENT SPEECH LANGUAGE PATHOLOGY TREATMENT NOTE   Patient Name: Alan Stewart MRN: [id number] DOB:September 02, 1945, 77 y.o., male 66 Date: 05/28/2023  PCP: Daniel Nones, MD REFERRING PROVIDER: Daniel Nones, MD   END OF SESSION  End of Session - 05/28/23 1015     Visit Number 124    Number of Visits 134    Date for SLP Re-Evaluation 06/11/23    Authorization Type [employer] H[employer]    Authorization Time Period 03/21/2023 thru 07/04/2023    Authorization - Visit Number 22    Authorization - Number of Visits 40    Progress Note Due on Visit 130    SLP Start Time 1015    SLP Stop Time  1100    SLP Time Calculation (min) 45 min    Activity Tolerance Patient tolerated treatment well                   Past Medical History:  Diagnosis Date   CAD (coronary artery disease)    a. 10/2021 NSTEMI/PCI: LM nl, LAD min irregs, LCX 5m (2.5x18 Onyx Frontier DES), OM1 nl, RCA 73m (3.5x26 Onyx Frontier DES), 30d.   Diastolic dysfunction    a. 10/2021 Echo: EF 50-55%, no rwma, Gr1 DD, nl RV fxn. No significant valvular dzs.   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 SCCis. MOHs 07/08/22   Past Surgical History:  Procedure Laterality Date   COLONOSCOPY N/A 10/09/2015   Procedure: COLONOSCOPY;  Surgeon: Wallace Cullens, MD;  Location: [facility] ENDOSCOPY;  Service: Gastroenterology;  Laterality: N/A;   CORONARY STENT INTERVENTION N/A 10/22/2021   Procedure: CORONARY STENT INTERVENTION;  Surgeon: Iran Ouch, MD;  Location: [facility] INVASIVE CV LAB;  Service: Cardiovascular;  Laterality: N/A;   IR CT HEAD LTD  05/21/2022   IR PERCUTANEOUS ART THROMBECTOMY/INFUSION INTRACRANIAL INC DIAG ANGIO  05/21/2022   LEFT HEART CATH AND CORONARY ANGIOGRAPHY N/A 10/22/2021   Procedure: LEFT HEART  CATH AND CORONARY ANGIOGRAPHY;  Surgeon: Iran Ouch, MD;  Location: [facility] INVASIVE CV LAB;  Service: Cardiovascular;  Laterality: N/A;   LOOP RECORDER INSERTION N/A 05/23/2022   Procedure: LOOP RECORDER INSERTION;  Surgeon: Lanier Prude, MD;  Location: [facility] INVASIVE CV LAB;  Service: Cardiovascular;  Laterality: N/A;   RADIOLOGY WITH ANESTHESIA N/A 05/20/2022   Procedure: IR WITH ANESTHESIA;  Surgeon: Julieanne Cotton, MD;  Location: [facility] OR;  Service: Radiology;  Laterality: N/A;   ROTATOR CUFF REPAIR     Patient Active Problem List   Diagnosis Date Noted   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 male with PMH: HTN, CAD, HLD, osteoarthritis, SCC, sleep apnea. He presented to the hospital on 05/20/22 with right sided weakness and aphasia with LKW reported by wife to be 10pm on 10/2.  He arrived  at [facility] as code stroke and taken for emergent CT/CTA which demonstrated left M1 occlusion. He received IV tenecteplase and IR thrombectomy due to large vessel occlusion.       DIAGNOSTIC FINDINGS:  MRI 05/22/2022 Cortical ischemia throughout the left MCA territory compatible with recent infarct. Relatively little affect on the white matter. 2. Small focus of acute hemorrhage along the anterior surface of the left temporal lobe Heidelberg classification 3c: Subarachnoid hemorrhage. 3. Normal intracranial MRA.  Restored flow in left MCA.    THERAPY DIAG:  Aphasia  Apraxia  Middle cerebral artery embolism, left  Rationale for  Evaluation and Treatment Rehabilitation  SUBJECTIVE:   PAIN:  Are you having pain? No  PATIENT GOALS: to be able to communicate again  SUBJECTIVE STATEMENT: "He is smiling more" pt also reports increased practice Pt accompanied by: self and significant other  OBJECTIVE:   TODAY'S TREATMENT: Skilled treatment session focused on pt's communication goals. SLP facilitated session by providing the following interventions:   Speech intelligibility using the phrases that pt has starred in his Text to Speech App - Pt with improved ability to imitate functional phrases in 11 out of 11 opportunities with rare Min A  TalkPath Therapy App utilized to target motor patterns and improve speech intelligibility Functional Repetition - Phrase Level 1 - 14 out of 20 improving to 20 out of 20 with Rare Min A; additional set 14 out of 20 improving to 20 out of 20 with Rare Min A Functional Repetition - Phrase Level 2 - 12 out of 20 with Moderate A  PATIENT EDUCATION: Education details: see above Person educated: Patient and Wife Education method: Programmer, multimedia, Demonstration, Verbal cues, and Handouts Education comprehension: verbalized understanding  HOME EXERCISE PROGRAM: Instructed wife to provide written phrases (to support verbal imitation of functional phrases) JUMBLES Practice functional phrases contained in Text to Speech App  GOALS: Goals reviewed with patient? Yes  SHORT TERM GOALS: Target date: 10 sessions   UPDATED: 04/16/2023   UPDATED: 05/19/2023 3.   Pt will improve wording finding abilities by writing 3 or more words during communication exchanges to increase listener understanding with moderate A in 5 out of 7 opportunities; ONGOING;   05/19/2023 - PROGRESS MADE - currently writing down 2-3 words with moderate assistance in 3 of 7 opportunities 5. Pt will improve spelling ability by accurately spelling SEMI-COMPLEX words during communication exchanges to increase listener  understanding with min A in 5 out of 7 opportunities; ONGOING  05/19/2023 - PROGRESS MADE - currently working at [employer] A level in 3 out of 7 opportunities 6.  Pt will read self-generated words during communication exchanges with > 50% speech intelligibility given Min A articulatory cues; ONGOING  05/19/2023 - MET - upgraded to Pt will read self-generated words during communication exchanges with > 75% speech intelligibility given Min A articulatory cues  LONG TERM GOALS: Target date: 06/11/2023     Pt will use multimodal communication to express basic wants/needs in 4 of 10 opportunities.  Baseline: unable Goal status: ONGOING; MET when using combination of verbal, paper and pencil and Text to Speech App 05/19/2023 - Upgraded to Pt will use multimodal communication to express basic wants/needs in 7 out of 10 opportunities.   2.  Pt will initiate communication in 3 of 5 opportunities as reported by caregiver outside of ST sessions.   Baseline: no initiation  Goal Status: Initial: ONGOING; his wife reports mild increase in attempts    ASSESSMENT:  CLINICAL IMPRESSION: Pt  is a 77 year old male who was seen today for therapy targeting his aphasia. Pt with improved communication using text to speech app.  Pt's wife states that pt's son called and they had "quit" the conversation. "I haven't hear him speak that many words before." Pt also with increased ability to produce accurate motor patterns during today's functional speech tasks.    See the above treatment note for details.   OBJECTIVE IMPAIRMENTS include expressive language, receptive language, aphasia, and apraxia. These impairments are limiting patient from managing medications, managing appointments, managing finances, household responsibilities, ADLs/IADLs, and effectively communicating at home and in community. Factors affecting potential to achieve goals and functional outcome are severity of impairments. Patient will benefit from  skilled SLP services to address above impairments and improve overall function.  REHAB POTENTIAL: Excellent  PLAN: SLP FREQUENCY: 3x/week  SLP DURATION: 12 weeks  PLANNED INTERVENTIONS: Language facilitation, Environmental controls, Internal/external aids, Functional tasks, Multimodal communication approach, SLP instruction and feedback, Compensatory strategies, and Patient/family education    Daquann Merriott B. Dreama Saa, M.S., CCC-SLP, Tree surgeon Certified Brain Injury Specialist [facility]  [facility] Rehabilitation Services Office [phone] Ascom [phone] Fax [phone]

## 2023-05-30 ENCOUNTER — Ambulatory Visit: Payer: Medicare HMO | Admitting: Speech Pathology

## 2023-05-30 DIAGNOSIS — I6602 Occlusion and stenosis of left middle cerebral artery: Secondary | ICD-10-CM | POA: Diagnosis not present

## 2023-05-30 DIAGNOSIS — R482 Apraxia: Secondary | ICD-10-CM

## 2023-05-30 DIAGNOSIS — R4701 Aphasia: Secondary | ICD-10-CM | POA: Diagnosis not present

## 2023-05-30 NOTE — Therapy (Signed)
OUTPATIENT SPEECH LANGUAGE PATHOLOGY TREATMENT NOTE   Patient Name: Alan Stewart MRN: [id number] DOB:1946/01/25, 77 y.o., male 89 Date: 05/30/2023  PCP: Daniel Nones, MD REFERRING PROVIDER: Daniel Nones, MD   END OF SESSION  End of Session - 05/30/23 1033     Visit Number 125    Number of Visits 134    Date for SLP Re-Evaluation 06/11/23    Authorization Type [employer] H[employer]    Authorization Time Period 03/21/2023 thru 07/04/2023    Authorization - Visit Number 23    Authorization - Number of Visits 40    Progress Note Due on Visit 130    SLP Start Time 1015    SLP Stop Time  1100    SLP Time Calculation (min) 45 min                   Past Medical History:  Diagnosis Date   CAD (coronary artery disease)    a. 10/2021 NSTEMI/PCI: LM nl, LAD min irregs, LCX 62m (2.5x18 Onyx Frontier DES), OM1 nl, RCA 19m (3.5x26 Onyx Frontier DES), 30d.   Diastolic dysfunction    a. 10/2021 Echo: EF 50-55%, no rwma, Gr1 DD, nl RV fxn. No significant valvular dzs.   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 SCCis. MOHs 07/08/22   Past Surgical History:  Procedure Laterality Date   COLONOSCOPY N/A 10/09/2015   Procedure: COLONOSCOPY;  Surgeon: Wallace Cullens, MD;  Location: [facility] ENDOSCOPY;  Service: Gastroenterology;  Laterality: N/A;   CORONARY STENT INTERVENTION N/A 10/22/2021   Procedure: CORONARY STENT INTERVENTION;  Surgeon: Iran Ouch, MD;  Location: [facility] INVASIVE CV LAB;  Service: Cardiovascular;  Laterality: N/A;   IR CT HEAD LTD  05/21/2022   IR PERCUTANEOUS ART THROMBECTOMY/INFUSION INTRACRANIAL INC DIAG ANGIO  05/21/2022   LEFT HEART CATH AND CORONARY ANGIOGRAPHY N/A 10/22/2021   Procedure: LEFT HEART CATH AND CORONARY ANGIOGRAPHY;  Surgeon: Iran Ouch, MD;  Location: [facility] INVASIVE CV LAB;  Service: Cardiovascular;  Laterality: N/A;   LOOP RECORDER INSERTION N/A 05/23/2022   Procedure: LOOP RECORDER INSERTION;  Surgeon: Lanier Prude, MD;  Location: [facility] INVASIVE CV LAB;  Service: Cardiovascular;  Laterality: N/A;   RADIOLOGY WITH ANESTHESIA N/A 05/20/2022   Procedure: IR WITH ANESTHESIA;  Surgeon: Julieanne Cotton, MD;  Location: [facility] OR;  Service: Radiology;  Laterality: N/A;   ROTATOR CUFF REPAIR     Patient Active Problem List   Diagnosis Date Noted   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 male with PMH: HTN, CAD, HLD, osteoarthritis, SCC, sleep apnea. He presented to the hospital on 05/20/22 with right sided weakness and aphasia with LKW reported by wife to be 10pm on 10/2.  He arrived at [facility] as code stroke and taken for  emergent CT/CTA which demonstrated left M1 occlusion. He received IV tenecteplase and IR thrombectomy due to large vessel occlusion.       DIAGNOSTIC FINDINGS:  MRI 05/22/2022 Cortical ischemia throughout the left MCA territory compatible with recent infarct. Relatively little affect on the white matter. 2. Small focus of acute hemorrhage along the anterior surface of the left temporal lobe Heidelberg classification 3c: Subarachnoid hemorrhage. 3. Normal intracranial MRA.  Restored flow in left MCA.    THERAPY DIAG:  Aphasia  Apraxia  Middle cerebral artery embolism, left  Rationale for Evaluation and Treatment Rehabilitation  SUBJECTIVE:    PAIN:  Are you having pain? No  PATIENT GOALS: to be able to communicate again  SUBJECTIVE STATEMENT: Pt sat down and immediately stated "[employer]" indicating they were going to grocery store after session Pt accompanied by: self and significant other  OBJECTIVE:   TODAY'S TREATMENT: Skilled treatment session focused on pt's communication goals. SLP facilitated session by providing the following interventions:   Pt wrote down items that they needed from grocery store and said each item with > 90% speech intelligibility in 90% of opportunities - pt commented "I am trying"  TalkPath Therapy App utilized to target motor patterns and improve speech intelligibility Functional Repetition - Word Level 4 - 15 out of 20; 17 out 20 Functional Repetition - Word Level 5 - 12 out of 20 improving to 20 out of 20 with slight Min a - pt observed elf-correcting x3   Functional Repetition - Phrase Level 3 - 15 out of 20 with Rare Min A   PATIENT EDUCATION: Education details: see above Person educated: Patient and Wife Education method: Programmer, multimedia, Demonstration, Verbal cues, and Handouts Education comprehension: verbalized understanding  HOME EXERCISE PROGRAM: Instructed wife to provide written phrases (to support verbal imitation of functional phrases) JUMBLES Practice functional phrases contained in Text to Speech App  GOALS: Goals reviewed with patient? Yes  SHORT TERM GOALS: Target date: 10 sessions   UPDATED: 04/16/2023   UPDATED: 05/19/2023 3.   Pt will improve wording finding abilities by writing 3 or more words during communication exchanges to increase listener understanding with moderate A in 5 out of 7 opportunities; ONGOING;   05/19/2023 - PROGRESS MADE - currently writing down 2-3 words with moderate assistance in 3 of 7 opportunities 5. Pt will improve spelling ability by accurately spelling SEMI-COMPLEX words during communication exchanges to increase listener  understanding with min A in 5 out of 7 opportunities; ONGOING  05/19/2023 - PROGRESS MADE - currently working at [employer] A level in 3 out of 7 opportunities 6.  Pt will read self-generated words during communication exchanges with > 50% speech intelligibility given Min A articulatory cues; ONGOING  05/19/2023 - MET - upgraded to Pt will read self-generated words during communication exchanges with > 75% speech intelligibility given Min A articulatory cues  LONG TERM GOALS: Target date: 06/11/2023     Pt will use multimodal communication to express basic wants/needs in 4 of 10 opportunities.  Baseline: unable Goal status: ONGOING; MET when using combination of verbal, paper and pencil and Text to Speech App 05/19/2023 - Upgraded to Pt will use multimodal communication to express basic wants/needs in 7 out of 10 opportunities.   2.  Pt will initiate communication in 3 of 5 opportunities as reported by caregiver outside of ST sessions.   Baseline: no initiation  Goal Status: Initial: ONGOING; his wife reports mild increase in attempts    ASSESSMENT:  CLINICAL IMPRESSION:  Pt is a 77 year old male who was seen today for therapy targeting his aphasia. Pt with improved communication using text to speech app.  Pt continues to demonstrate improved speech intelligibility and use of accurate motor speech patterns. He continues to state, "I am trying."   See the above treatment note for details.   OBJECTIVE IMPAIRMENTS include expressive language, receptive language, aphasia, and apraxia. These impairments are limiting patient from managing medications, managing appointments, managing finances, household responsibilities, ADLs/IADLs, and effectively communicating at home and in community. Factors affecting potential to achieve goals and functional outcome are severity of impairments. Patient will benefit from skilled SLP services to address above impairments and improve overall function.  REHAB POTENTIAL:  Excellent  PLAN: SLP FREQUENCY: 3x/week  SLP DURATION: 12 weeks  PLANNED INTERVENTIONS: Language facilitation, Environmental controls, Internal/external aids, Functional tasks, Multimodal communication approach, SLP instruction and feedback, Compensatory strategies, and Patient/family education    Kenzo Ozment B. Dreama Saa, M.S., CCC-SLP, Tree surgeon Certified Brain Injury Specialist [facility]  [facility] Rehabilitation Services Office [phone] Ascom [phone] Fax [phone]

## 2023-06-02 ENCOUNTER — Ambulatory Visit: Payer: Medicare HMO | Admitting: Speech Pathology

## 2023-06-02 ENCOUNTER — Ambulatory Visit (INDEPENDENT_AMBULATORY_CARE_PROVIDER_SITE_OTHER): Payer: Medicare HMO

## 2023-06-02 DIAGNOSIS — I6602 Occlusion and stenosis of left middle cerebral artery: Secondary | ICD-10-CM | POA: Diagnosis not present

## 2023-06-02 DIAGNOSIS — R4701 Aphasia: Secondary | ICD-10-CM

## 2023-06-02 DIAGNOSIS — I63412 Cerebral infarction due to embolism of left middle cerebral artery: Secondary | ICD-10-CM | POA: Diagnosis not present

## 2023-06-02 DIAGNOSIS — R482 Apraxia: Secondary | ICD-10-CM | POA: Diagnosis not present

## 2023-06-02 LAB — CUP PACEART REMOTE DEVICE CHECK
Date Time Interrogation Session: 20241014142818
Implantable Pulse Generator Implant Date: 20231005
Pulse Gen Model: 450218
Pulse Gen Serial Number: 95054077

## 2023-06-02 NOTE — Therapy (Signed)
OUTPATIENT SPEECH LANGUAGE PATHOLOGY TREATMENT NOTE   Patient Name: Alan Stewart MRN: [id number] DOB:1946-02-03, 77 y.o., male 75 Date: 06/02/2023  PCP: Daniel Nones, MD REFERRING PROVIDER: Daniel Nones, MD   END OF SESSION  End of Session - 06/02/23 1017     Visit Number 126    Number of Visits 134    Date for SLP Re-Evaluation 06/11/23    Authorization Type [employer] H[employer]    Authorization Time Period 03/21/2023 thru 07/04/2023    Authorization - Visit Number 24    Authorization - Number of Visits 40    Progress Note Due on Visit 130    SLP Start Time 1015    SLP Stop Time  1100    SLP Time Calculation (min) 45 min    Activity Tolerance Patient tolerated treatment well                   Past Medical History:  Diagnosis Date   CAD (coronary artery disease)    a. 10/2021 NSTEMI/PCI: LM nl, LAD min irregs, LCX 97m (2.5x18 Onyx Frontier DES), OM1 nl, RCA 92m (3.5x26 Onyx Frontier DES), 30d.   Diastolic dysfunction    a. 10/2021 Echo: EF 50-55%, no rwma, Gr1 DD, nl RV fxn. No significant valvular dzs.   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 SCCis. MOHs 07/08/22   Past Surgical History:  Procedure Laterality Date   COLONOSCOPY N/A 10/09/2015   Procedure: COLONOSCOPY;  Surgeon: Wallace Cullens, MD;  Location: [facility] ENDOSCOPY;  Service: Gastroenterology;  Laterality: N/A;   CORONARY STENT INTERVENTION N/A 10/22/2021   Procedure: CORONARY STENT INTERVENTION;  Surgeon: Iran Ouch, MD;  Location: [facility] INVASIVE CV LAB;  Service: Cardiovascular;  Laterality: N/A;   IR CT HEAD LTD  05/21/2022   IR PERCUTANEOUS ART THROMBECTOMY/INFUSION INTRACRANIAL INC DIAG ANGIO  05/21/2022   LEFT HEART CATH AND CORONARY ANGIOGRAPHY N/A 10/22/2021   Procedure: LEFT HEART  CATH AND CORONARY ANGIOGRAPHY;  Surgeon: Iran Ouch, MD;  Location: [facility] INVASIVE CV LAB;  Service: Cardiovascular;  Laterality: N/A;   LOOP RECORDER INSERTION N/A 05/23/2022   Procedure: LOOP RECORDER INSERTION;  Surgeon: Lanier Prude, MD;  Location: [facility] INVASIVE CV LAB;  Service: Cardiovascular;  Laterality: N/A;   RADIOLOGY WITH ANESTHESIA N/A 05/20/2022   Procedure: IR WITH ANESTHESIA;  Surgeon: Julieanne Cotton, MD;  Location: [facility] OR;  Service: Radiology;  Laterality: N/A;   ROTATOR CUFF REPAIR     Patient Active Problem List   Diagnosis Date Noted   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 male with PMH: HTN, CAD, HLD, osteoarthritis, SCC, sleep apnea. He presented to the hospital on 05/20/22 with right sided weakness and aphasia with LKW reported by wife to be 10pm on 10/2.  He arrived  at [facility] as code stroke and taken for emergent CT/CTA which demonstrated left M1 occlusion. He received IV tenecteplase and IR thrombectomy due to large vessel occlusion.       DIAGNOSTIC FINDINGS:  MRI 05/22/2022 Cortical ischemia throughout the left MCA territory compatible with recent infarct. Relatively little affect on the white matter. 2. Small focus of acute hemorrhage along the anterior surface of the left temporal lobe Heidelberg classification 3c: Subarachnoid hemorrhage. 3. Normal intracranial MRA.  Restored flow in left MCA.    THERAPY DIAG:  Aphasia  Apraxia  Middle cerebral artery embolism, left  Rationale for  Evaluation and Treatment Rehabilitation  SUBJECTIVE:   PAIN:  Are you having pain? No  PATIENT GOALS: to be able to communicate again  SUBJECTIVE STATEMENT: He did talk in complete sentences several times this weekend and his son called him once this weekend Pt accompanied by: self and significant other  OBJECTIVE:   TODAY'S TREATMENT: Skilled treatment session focused on pt's communication goals. SLP facilitated session by providing the following interventions:   Functional Speech Utterances contained on his Text to Speech App - 17 out of 21 utterance siwh t> 95% speech intelligibility  TalkPath Therapy App utilized to target motor patterns and improve speech intelligibility Functional Repetition - Word Level 4 - 16 out of 20; 17 out 20 Functional Repetition - Word Level 5 - 12 out of 20 improving to 20 out of 20 with slight Min a - pt observed self-correcting x5; during second set, pt also achieved 12 out of 20 improving to 15 out of 20 with self-correcting  Counting - when presented with a number during the above activities, pt stated "I can" indicating that he was able to count all numbers. In an effort to promote awareness and decrease impulsivity, SLP engaged pt in game of Solitaire. Pt required moderate cues for counting the objects on each card in order to say each number. In addition, pt required cues to slow rate of counting to state the correct number in 5 out of 5 opportunities.     PATIENT EDUCATION: Education details: see above Person educated: Patient and Wife Education method: Programmer, multimedia, Facilities manager, Verbal cues, and Handouts Education comprehension: verbalized understanding  HOME EXERCISE PROGRAM: Instructed wife to provide written phrases (to support verbal imitation of functional phrases) JUMBLES Practice functional phrases contained in Text to Speech App  GOALS: Goals reviewed with patient? Yes  SHORT TERM GOALS: Target date: 10 sessions   UPDATED:  04/16/2023   UPDATED: 05/19/2023 3.   Pt will improve wording finding abilities by writing 3 or more words during communication exchanges to increase listener understanding with moderate A in 5 out of 7 opportunities; ONGOING;   05/19/2023 - PROGRESS MADE - currently writing down 2-3 words with moderate assistance in 3 of 7 opportunities 5. Pt will improve spelling ability by accurately spelling SEMI-COMPLEX words during communication exchanges to increase listener understanding with min A in 5 out of 7 opportunities; ONGOING  05/19/2023 - PROGRESS MADE - currently working at [employer] A level in 3 out of 7 opportunities 6.  Pt will read self-generated words during communication exchanges with > 50% speech intelligibility given Min A articulatory cues; ONGOING  05/19/2023 - MET - upgraded to Pt will read self-generated words during communication exchanges with > 75% speech intelligibility given Min A articulatory cues  LONG TERM GOALS: Target date: 06/11/2023     Pt will use multimodal communication to express basic wants/needs in 4 of 10 opportunities.  Baseline: unable Goal status: ONGOING; MET when using combination of verbal, paper and pencil and Text to Speech App 05/19/2023 - Upgraded to Pt will use multimodal communication to express basic wants/needs in 7 out of 10 opportunities.   2.  Pt will initiate communication in 3 of 5 opportunities as reported by caregiver outside of ST sessions.   Baseline: no initiation  Goal Status: Initial: ONGOING; his wife reports mild increase in attempts    ASSESSMENT:  CLINICAL IMPRESSION: Pt is a 77 year old male who was seen today for therapy targeting his aphasia. Pt with improved communication using text to speech app.  Pt continues to demonstrate improved speech intelligibility and use of accurate motor speech patterns. He continues to state, "I am trying."   See the above treatment note for details.   OBJECTIVE IMPAIRMENTS include expressive  language, receptive language, aphasia, and apraxia. These impairments are limiting patient from managing medications, managing appointments, managing finances, household responsibilities, ADLs/IADLs, and effectively communicating at home and in community. Factors affecting potential to achieve goals and functional outcome are severity of impairments. Patient will benefit from skilled SLP services to address above impairments and improve overall function.  REHAB POTENTIAL: Excellent  PLAN: SLP FREQUENCY: 3x/week  SLP DURATION: 12 weeks  PLANNED INTERVENTIONS: Language facilitation, Environmental controls, Internal/external aids, Functional tasks, Multimodal communication approach, SLP instruction and feedback, Compensatory strategies, and Patient/family education    Hutton Pellicane B. Dreama Saa, M.S., CCC-SLP, Tree surgeon Certified Brain Injury Specialist [facility]  [facility] Rehabilitation Services Office [phone] Ascom [phone] Fax [phone]

## 2023-06-03 DIAGNOSIS — I10 Essential (primary) hypertension: Secondary | ICD-10-CM | POA: Diagnosis not present

## 2023-06-03 DIAGNOSIS — E785 Hyperlipidemia, unspecified: Secondary | ICD-10-CM | POA: Diagnosis not present

## 2023-06-03 DIAGNOSIS — Z8673 Personal history of transient ischemic attack (TIA), and cerebral infarction without residual deficits: Secondary | ICD-10-CM | POA: Diagnosis not present

## 2023-06-03 DIAGNOSIS — I2510 Atherosclerotic heart disease of native coronary artery without angina pectoris: Secondary | ICD-10-CM | POA: Diagnosis not present

## 2023-06-03 DIAGNOSIS — E1165 Type 2 diabetes mellitus with hyperglycemia: Secondary | ICD-10-CM | POA: Diagnosis not present

## 2023-06-03 DIAGNOSIS — Z23 Encounter for immunization: Secondary | ICD-10-CM | POA: Diagnosis not present

## 2023-06-03 DIAGNOSIS — E039 Hypothyroidism, unspecified: Secondary | ICD-10-CM | POA: Diagnosis not present

## 2023-06-03 DIAGNOSIS — Z6841 Body Mass Index (BMI) 40.0 and over, adult: Secondary | ICD-10-CM | POA: Diagnosis not present

## 2023-06-03 DIAGNOSIS — E119 Type 2 diabetes mellitus without complications: Secondary | ICD-10-CM | POA: Diagnosis not present

## 2023-06-04 ENCOUNTER — Ambulatory Visit: Payer: Medicare HMO | Admitting: Speech Pathology

## 2023-06-06 ENCOUNTER — Ambulatory Visit: Payer: Medicare HMO | Admitting: Speech Pathology

## 2023-06-09 ENCOUNTER — Ambulatory Visit: Payer: Medicare HMO | Admitting: Speech Pathology

## 2023-06-09 DIAGNOSIS — R482 Apraxia: Secondary | ICD-10-CM | POA: Diagnosis not present

## 2023-06-09 DIAGNOSIS — R4701 Aphasia: Secondary | ICD-10-CM | POA: Diagnosis not present

## 2023-06-09 DIAGNOSIS — I6602 Occlusion and stenosis of left middle cerebral artery: Secondary | ICD-10-CM

## 2023-06-09 NOTE — Therapy (Signed)
OUTPATIENT SPEECH LANGUAGE PATHOLOGY TREATMENT NOTE   Patient Name: Alan Stewart MRN: [id number] DOB:Feb 28, 1946, 77 y.o., male 21 Date: 06/09/2023  PCP: Daniel Nones, MD REFERRING PROVIDER: Daniel Nones, MD   END OF SESSION  End of Session - 06/09/23 1015     Visit Number 127    Number of Visits 134    Date for SLP Re-Evaluation 06/11/23    Authorization Type [employer] H[employer]    Authorization Time Period 03/21/2023 thru 07/04/2023    Authorization - Visit Number 25    Authorization - Number of Visits 40    Progress Note Due on Visit 130    SLP Start Time 1015    SLP Stop Time  1100    SLP Time Calculation (min) 45 min    Activity Tolerance Patient tolerated treatment well                   Past Medical History:  Diagnosis Date   CAD (coronary artery disease)    a. 10/2021 NSTEMI/PCI: LM nl, LAD min irregs, LCX 110m (2.5x18 Onyx Frontier DES), OM1 nl, RCA 44m (3.5x26 Onyx Frontier DES), 30d.   Diastolic dysfunction    a. 10/2021 Echo: EF 50-55%, no rwma, Gr1 DD, nl RV fxn. No significant valvular dzs.   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 SCCis. MOHs 07/08/22   Past Surgical History:  Procedure Laterality Date   COLONOSCOPY N/A 10/09/2015   Procedure: COLONOSCOPY;  Surgeon: Wallace Cullens, MD;  Location: [facility] ENDOSCOPY;  Service: Gastroenterology;  Laterality: N/A;   CORONARY STENT INTERVENTION N/A 10/22/2021   Procedure: CORONARY STENT INTERVENTION;  Surgeon: Iran Ouch, MD;  Location: [facility] INVASIVE CV LAB;  Service: Cardiovascular;  Laterality: N/A;   IR CT HEAD LTD  05/21/2022   IR PERCUTANEOUS ART THROMBECTOMY/INFUSION INTRACRANIAL INC DIAG ANGIO  05/21/2022   LEFT HEART CATH AND CORONARY ANGIOGRAPHY N/A 10/22/2021   Procedure: LEFT HEART  CATH AND CORONARY ANGIOGRAPHY;  Surgeon: Iran Ouch, MD;  Location: [facility] INVASIVE CV LAB;  Service: Cardiovascular;  Laterality: N/A;   LOOP RECORDER INSERTION N/A 05/23/2022   Procedure: LOOP RECORDER INSERTION;  Surgeon: Lanier Prude, MD;  Location: [facility] INVASIVE CV LAB;  Service: Cardiovascular;  Laterality: N/A;   RADIOLOGY WITH ANESTHESIA N/A 05/20/2022   Procedure: IR WITH ANESTHESIA;  Surgeon: Julieanne Cotton, MD;  Location: [facility] OR;  Service: Radiology;  Laterality: N/A;   ROTATOR CUFF REPAIR     Patient Active Problem List   Diagnosis Date Noted   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 male with PMH: HTN, CAD, HLD, osteoarthritis, SCC, sleep apnea. He presented to the hospital on 05/20/22 with right sided weakness and aphasia with LKW reported by wife to be 10pm on 10/2.  He arrived  at [facility] as code stroke and taken for emergent CT/CTA which demonstrated left M1 occlusion. He received IV tenecteplase and IR thrombectomy due to large vessel occlusion.       DIAGNOSTIC FINDINGS:  MRI 05/22/2022 Cortical ischemia throughout the left MCA territory compatible with recent infarct. Relatively little affect on the white matter. 2. Small focus of acute hemorrhage along the anterior surface of the left temporal lobe Heidelberg classification 3c: Subarachnoid hemorrhage. 3. Normal intracranial MRA.  Restored flow in left MCA.    THERAPY DIAG:  Aphasia  Apraxia  Middle cerebral artery embolism, left  Rationale for  Evaluation and Treatment Rehabilitation  SUBJECTIVE:   PAIN:  Are you having pain? No  PATIENT GOALS: to be able to communicate again  SUBJECTIVE STATEMENT: Pt missed 2 sessions d/t wife's meeting Pt accompanied by: self and significant other  OBJECTIVE:   TODAY'S TREATMENT: Skilled treatment session focused on pt's communication goals. SLP facilitated session by providing the following interventions:   Functional Speech Utterances contained on his Text to Speech App - 9 out of 10 statements with > 95% speech intelligibility  TalkPath Therapy App utilized to target motor patterns and improve speech intelligibility Functional Repetition - Word Level 4 - 16 out of 20; 17 out 20 pt requiring moderate to maximal a=multimodal assistance to improve speech intelligibility during today's session  Specific difficulty with medial /s/ during today's session - SLP further facilitated session by providing moderate multi-modal assistance to achieve ~ 75% speech intelligibility at the medial word level   PATIENT EDUCATION: Education details: see above Person educated: Patient and Wife Education method: Explanation, Demonstration, Verbal cues, and Handouts Education comprehension: verbalized understanding  HOME EXERCISE PROGRAM: Practice functional phrases contained in Text to Speech App  GOALS: Goals reviewed with patient? Yes  SHORT TERM GOALS: Target date: 10 sessions   UPDATED: 04/16/2023   UPDATED: 05/19/2023 3.   Pt will improve wording finding abilities by writing 3 or more words during communication exchanges to increase listener understanding with moderate A in 5 out of 7 opportunities; ONGOING;   05/19/2023 - PROGRESS MADE - currently writing down 2-3 words with moderate assistance in 3 of 7 opportunities 5. Pt will improve spelling ability by accurately spelling SEMI-COMPLEX words during communication exchanges to increase listener understanding with min A in 5 out of 7  opportunities; ONGOING  05/19/2023 - PROGRESS MADE - currently working at [employer] A level in 3 out of 7 opportunities 6.  Pt will read self-generated words during communication exchanges with > 50% speech intelligibility given Min A articulatory cues; ONGOING  05/19/2023 - MET - upgraded to Pt will read self-generated words during communication exchanges with > 75% speech intelligibility given Min A articulatory cues  LONG TERM GOALS: Target date: 06/11/2023     Pt will use multimodal communication to express basic wants/needs in 4 of 10 opportunities.  Baseline: unable Goal status: ONGOING; MET when using combination of verbal, paper and pencil and Text to Speech App 05/19/2023 - Upgraded to Pt will use multimodal communication to express basic wants/needs in 7 out of 10 opportunities.   2.  Pt will initiate communication in 3 of 5 opportunities as reported by caregiver outside of ST sessions.   Baseline: no initiation  Goal Status: Initial: ONGOING; his wife reports mild increase in attempts    ASSESSMENT:  CLINICAL IMPRESSION: Pt is a 77 year old male who was seen today for therapy targeting his aphasia. Pt with improved communication using text  to speech app.  Pt continues to demonstrate improved speech intelligibility and use of accurate motor speech patterns. He continues to state, "I am trying." Pt observed with 4 spontaneous complete sentences during today's session.   See the above treatment note for details.   OBJECTIVE IMPAIRMENTS include expressive language, receptive language, aphasia, and apraxia. These impairments are limiting patient from managing medications, managing appointments, managing finances, household responsibilities, ADLs/IADLs, and effectively communicating at home and in community. Factors affecting potential to achieve goals and functional outcome are severity of impairments. Patient will benefit from skilled SLP services to address above impairments and improve  overall function.  REHAB POTENTIAL: Excellent  PLAN: SLP FREQUENCY: 3x/week  SLP DURATION: 12 weeks  PLANNED INTERVENTIONS: Language facilitation, Environmental controls, Internal/external aids, Functional tasks, Multimodal communication approach, SLP instruction and feedback, Compensatory strategies, and Patient/family education    Tameshia Bonneville B. Dreama Saa, M.S., CCC-SLP, Tree surgeon Certified Brain Injury Specialist [facility]  [facility] Services Office [phone] Ascom [phone] Fax [phone]

## 2023-06-11 ENCOUNTER — Ambulatory Visit: Payer: Medicare HMO | Admitting: Speech Pathology

## 2023-06-11 DIAGNOSIS — R4701 Aphasia: Secondary | ICD-10-CM | POA: Diagnosis not present

## 2023-06-11 DIAGNOSIS — R482 Apraxia: Secondary | ICD-10-CM

## 2023-06-11 DIAGNOSIS — I6602 Occlusion and stenosis of left middle cerebral artery: Secondary | ICD-10-CM

## 2023-06-11 NOTE — Therapy (Signed)
OUTPATIENT SPEECH LANGUAGE PATHOLOGY TREATMENT NOTE   Patient Name: Alan Stewart MRN: [id number] DOB:[birth year], 77 y.o., male 12 Date: 06/11/2023  PCP: Daniel Nones, MD REFERRING PROVIDER: Daniel Nones, MD   END OF SESSION  End of Session - 06/11/23 1102     Visit Number 128    Number of Visits 134    Date for SLP Re-Evaluation 06/11/23    Authorization Type [employer] H[employer]    Authorization Time Period 03/21/2023 thru 07/04/2023    Authorization - Visit Number 26    Authorization - Number of Visits 40    Progress Note Due on Visit 130    SLP Start Time 1015    SLP Stop Time  1100    SLP Time Calculation (min) 45 min    Activity Tolerance Patient tolerated treatment well                   Past Medical History:  Diagnosis Date   CAD (coronary artery disease)    a. 10/2021 NSTEMI/PCI: LM nl, LAD min irregs, LCX 65m (2.5x18 Onyx Frontier DES), OM1 nl, RCA 63m (3.5x26 Onyx Frontier DES), 30d.   Diastolic dysfunction    a. 10/2021 Echo: EF 50-55%, no rwma, Gr1 DD, nl RV fxn. No significant valvular dzs.   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 SCCis. MOHs 07/08/22   Past Surgical History:  Procedure Laterality Date   COLONOSCOPY N/A 10/09/2015   Procedure: COLONOSCOPY;  Surgeon: Wallace Cullens, MD;  Location: [facility] ENDOSCOPY;  Service: Gastroenterology;  Laterality: N/A;   CORONARY STENT INTERVENTION N/A 10/22/2021   Procedure: CORONARY STENT INTERVENTION;  Surgeon: Iran Ouch, MD;  Location: [facility] INVASIVE CV LAB;  Service: Cardiovascular;  Laterality: N/A;   IR CT HEAD LTD  05/21/2022   IR PERCUTANEOUS ART THROMBECTOMY/INFUSION INTRACRANIAL INC DIAG ANGIO  05/21/2022   LEFT HEART CATH AND CORONARY ANGIOGRAPHY N/A 10/22/2021   Procedure: LEFT HEART  CATH AND CORONARY ANGIOGRAPHY;  Surgeon: Iran Ouch, MD;  Location: [facility] INVASIVE CV LAB;  Service: Cardiovascular;  Laterality: N/A;   LOOP RECORDER INSERTION N/A 05/23/2022   Procedure: LOOP RECORDER INSERTION;  Surgeon: Lanier Prude, MD;  Location: [facility] INVASIVE CV LAB;  Service: Cardiovascular;  Laterality: N/A;   RADIOLOGY WITH ANESTHESIA N/A 05/20/2022   Procedure: IR WITH ANESTHESIA;  Surgeon: Julieanne Cotton, MD;  Location: [facility] OR;  Service: Radiology;  Laterality: N/A;   ROTATOR CUFF REPAIR     Patient Active Problem List   Diagnosis Date Noted   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 male with PMH: HTN, CAD, HLD, osteoarthritis, SCC, sleep apnea. He presented to the hospital on 05/20/22 with right sided weakness and aphasia with LKW reported by wife to be 10pm on 10/2.  He arrived  at [facility] as code stroke and taken for emergent CT/CTA which demonstrated left M1 occlusion. He received IV tenecteplase and IR thrombectomy due to large vessel occlusion.       DIAGNOSTIC FINDINGS:  MRI 05/22/2022 Cortical ischemia throughout the left MCA territory compatible with recent infarct. Relatively little affect on the white matter. 2. Small focus of acute hemorrhage along the anterior surface of the left temporal lobe Heidelberg classification 3c: Subarachnoid hemorrhage. 3. Normal intracranial MRA.  Restored flow in left MCA.    THERAPY DIAG:  Aphasia  Apraxia  Middle cerebral artery embolism, left  Rationale for  Evaluation and Treatment Rehabilitation  SUBJECTIVE:   PAIN:  Are you having pain? No  PATIENT GOALS: to be able to communicate again  SUBJECTIVE STATEMENT: Pt missed 2 sessions d/t wife's meeting Pt accompanied by: self and significant other  OBJECTIVE:   TODAY'S TREATMENT: Skilled treatment session focused on pt's communication goals. SLP facilitated session by providing the following interventions:  TalkPath Therapy App utilized to target motor patterns and improve speech intelligibility Functional Repetition - Word Level 4 - 16 out of 20; 17 out 20 pt requiring supervision level  Specific difficulty with medial /s/ during today's session - SLP further facilitated session by providing min A to supervision to achieve ~ 90% speech intelligibility at the medial word level   PATIENT EDUCATION: Education details: see above Person educated: Patient and Wife Education method: Explanation, Demonstration, Verbal cues, and Handouts Education comprehension: verbalized understanding  HOME EXERCISE PROGRAM: Practice functional phrases contained in Text to Speech App  GOALS: Goals reviewed with patient? Yes  SHORT TERM GOALS: Target date: 10 sessions   UPDATED: 04/16/2023   UPDATED: 05/19/2023 3.   Pt will improve wording finding abilities by writing 3 or more words during communication exchanges to increase listener understanding with moderate A in 5 out of 7 opportunities; ONGOING;   05/19/2023 - PROGRESS MADE - currently writing down 2-3 words with moderate assistance in 3 of 7 opportunities 5. Pt will improve spelling ability by accurately spelling SEMI-COMPLEX words during communication exchanges to increase listener understanding with min A in 5 out of 7 opportunities; ONGOING  05/19/2023 - PROGRESS MADE - currently working at [employer] A level in 3 out of 7 opportunities 6.  Pt will read self-generated words during communication exchanges with > 50% speech intelligibility given Min A  articulatory cues; ONGOING  05/19/2023 - MET - upgraded to Pt will read self-generated words during communication exchanges with > 75% speech intelligibility given Min A articulatory cues  LONG TERM GOALS: Target date: 06/11/2023     Pt will use multimodal communication to express basic wants/needs in 4 of 10 opportunities.  Baseline: unable Goal status: ONGOING; MET when using combination of verbal, paper and pencil and Text to Speech App 05/19/2023 - Upgraded to Pt will use multimodal communication to express basic wants/needs in 7 out of 10 opportunities.   2.  Pt will initiate communication in 3 of 5 opportunities as reported by caregiver outside of ST sessions.   Baseline: no initiation  Goal Status: Initial: ONGOING; his wife reports mild increase in attempts    ASSESSMENT:  CLINICAL IMPRESSION: Pt is a 77 year old male who was seen today for therapy targeting his aphasia. Pt with improved communication using text to speech app.  Pt continues to demonstrate improved speech intelligibility and use of accurate motor speech patterns. He indicated today that he had worked very Metallurgist the list of words  contained within his HEP.   See the above treatment note for details.   OBJECTIVE IMPAIRMENTS include expressive language, receptive language, aphasia, and apraxia. These impairments are limiting patient from managing medications, managing appointments, managing finances, household responsibilities, ADLs/IADLs, and effectively communicating at home and in community. Factors affecting potential to achieve goals and functional outcome are severity of impairments. Patient will benefit from skilled SLP services to address above impairments and improve overall function.  REHAB POTENTIAL: Excellent  PLAN: SLP FREQUENCY: 3x/week  SLP DURATION: 12 weeks  PLANNED INTERVENTIONS: Language facilitation, Environmental controls, Internal/external aids, Functional tasks, Multimodal  communication approach, SLP instruction and feedback, Compensatory strategies, and Patient/family education    Callista Hoh B. Dreama Saa, M.S., CCC-SLP, Tree surgeon Certified Brain Injury Specialist [facility]  [facility] Rehabilitation Services Office [phone] Ascom [phone] Fax [phone]

## 2023-06-13 ENCOUNTER — Ambulatory Visit: Payer: Medicare HMO | Admitting: Speech Pathology

## 2023-06-13 DIAGNOSIS — R4701 Aphasia: Secondary | ICD-10-CM | POA: Diagnosis not present

## 2023-06-13 DIAGNOSIS — I6602 Occlusion and stenosis of left middle cerebral artery: Secondary | ICD-10-CM | POA: Diagnosis not present

## 2023-06-13 DIAGNOSIS — R482 Apraxia: Secondary | ICD-10-CM

## 2023-06-13 NOTE — Therapy (Signed)
OUTPATIENT SPEECH LANGUAGE PATHOLOGY TREATMENT NOTE RE-CERTIFICATION REQUEST   Patient Name: Alan Stewart MRN: [id number] DOB:[birth year], 77 y.o., male 40 Date: 06/13/2023  PCP: Daniel Nones, MD REFERRING PROVIDER: Daniel Nones, MD   END OF SESSION  End of Session - 06/13/23 1057     Visit Number 129    Number of Visits 165    Date for SLP Re-Evaluation 09/05/23    Authorization Type [employer] H[employer]    Authorization Time Period 03/21/2023 thru 07/04/2023    Authorization - Visit Number 27    Authorization - Number of Visits 40    Progress Note Due on Visit 130    SLP Start Time 1015    SLP Stop Time  1100    SLP Time Calculation (min) 45 min    Activity Tolerance Patient tolerated treatment well                   Past Medical History:  Diagnosis Date   CAD (coronary artery disease)    a. 10/2021 NSTEMI/PCI: LM nl, LAD min irregs, LCX 68m (2.5x18 Onyx Frontier DES), OM1 nl, RCA 97m (3.5x26 Onyx Frontier DES), 30d.   Diastolic dysfunction    a. 10/2021 Echo: EF 50-55%, no rwma, Gr1 DD, nl RV fxn. No significant valvular dzs.   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 SCCis. MOHs 07/08/22   Past Surgical History:  Procedure Laterality Date   COLONOSCOPY N/A 10/09/2015   Procedure: COLONOSCOPY;  Surgeon: Wallace Cullens, MD;  Location: [facility] ENDOSCOPY;  Service: Gastroenterology;  Laterality: N/A;   CORONARY STENT INTERVENTION N/A 10/22/2021   Procedure: CORONARY STENT INTERVENTION;  Surgeon: Iran Ouch, MD;  Location: [facility] INVASIVE CV LAB;  Service: Cardiovascular;  Laterality: N/A;   IR CT HEAD LTD  05/21/2022   IR PERCUTANEOUS ART THROMBECTOMY/INFUSION INTRACRANIAL INC DIAG ANGIO  05/21/2022   LEFT HEART CATH AND CORONARY ANGIOGRAPHY N/A 10/22/2021    Procedure: LEFT HEART CATH AND CORONARY ANGIOGRAPHY;  Surgeon: Iran Ouch, MD;  Location: [facility] INVASIVE CV LAB;  Service: Cardiovascular;  Laterality: N/A;   LOOP RECORDER INSERTION N/A 05/23/2022   Procedure: LOOP RECORDER INSERTION;  Surgeon: Lanier Prude, MD;  Location: [facility] INVASIVE CV LAB;  Service: Cardiovascular;  Laterality: N/A;   RADIOLOGY WITH ANESTHESIA N/A 05/20/2022   Procedure: IR WITH ANESTHESIA;  Surgeon: Julieanne Cotton, MD;  Location: [facility] OR;  Service: Radiology;  Laterality: N/A;   ROTATOR CUFF REPAIR     Patient Active Problem List   Diagnosis Date Noted   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 male with PMH: HTN, CAD, HLD, osteoarthritis, SCC, sleep apnea. He presented to the hospital on 05/20/22 with right sided weakness and aphasia with LKW reported by wife to be 10pm on 10/2.  He arrived at [facility] as code stroke and taken for emergent CT/CTA which demonstrated left M1 occlusion. He received IV tenecteplase and IR thrombectomy due to large vessel occlusion.       DIAGNOSTIC FINDINGS:  MRI 05/22/2022 Cortical ischemia throughout the left MCA territory compatible with recent infarct. Relatively little affect on the white matter. 2. Small focus of acute hemorrhage along the anterior surface of the left temporal lobe Heidelberg classification 3c: Subarachnoid hemorrhage. 3. Normal intracranial MRA.  Restored flow in left MCA.    THERAPY DIAG:  Aphasia  Apraxia  Middle cerebral artery embolism,  left  Rationale for Evaluation and Treatment Rehabilitation  SUBJECTIVE:   PAIN:  Are you having pain? No  PATIENT GOALS: to be able to communicate again  SUBJECTIVE STATEMENT: "He has been good about practicing" Pt accompanied by: self and significant other  OBJECTIVE:   TODAY'S TREATMENT: Skilled treatment session focused on pt's communication goals. SLP facilitated session by providing the following interventions:   SLP further facilitated session by providing supervision to achieve ~ 90% speech intelligibility at the medial word level - much improved  Functional phrases on pt's Text to Speech App > 95% speech intelligibility when producing   Counting - pt independently counted numbers on cards during semi-complex game   PATIENT EDUCATION: Education details: see above Person educated: Patient and Wife Education method: Explanation, Demonstration, Verbal cues, and Handouts Education comprehension: verbalized understanding  HOME EXERCISE PROGRAM: Practice functional phrases contained in Text to Speech App  GOALS: Goals reviewed with patient? Yes  SHORT TERM GOALS: Target date: 10 sessions   UPDATED: 06/13/2023 3.   Pt will improve wording finding abilities by writing 3 or more words during communication exchanges to increase listener understanding with moderate A in 5 out of 7 opportunities; ONGOING;   05/19/2023 - PROGRESS MADE - currently writing down 2-3 words with - progress made moderate assistance in 3 of 7 opportunities 5. Pt will improve spelling ability by accurately spelling SEMI-COMPLEX words during communication exchanges to increase listener understanding with min A in 5 out of 7 opportunities; ONGOING  05/19/2023 - PROGRESS MADE - currently working at [employer] A level in 3 out of 7 opportunities - progress made 6.  Pt will read self-generated words during communication exchanges with > 50% speech intelligibility given Min A articulatory cues; ONGOING - progress  made  05/19/2023 - MET - upgraded to Pt will read self-generated words during communication exchanges with > 75% speech intelligibility given Min A articulatory cues - progress made  LONG TERM GOALS: Target date: 09/04/2022     Pt will use multimodal communication to express basic wants/needs in 4 of 10 opportunities.  Baseline: unable Goal status: ONGOING; MET when using combination of verbal, paper and pencil and Text to Speech App 05/19/2023 - Upgraded to Pt will use multimodal communication to express basic wants/needs in 7 out of 10 opportunities. - progress made  2.  Pt will initiate communication in 3 of 5 opportunities as reported by caregiver outside of ST sessions.   Baseline: no initiation  Goal Status: Initial: ONGOING; his wife reports mild increase in attempts - progress made    ASSESSMENT:  CLINICAL IMPRESSION: Pt is a 77 year old male who was seen today for therapy targeting his aphasia. Pt with improved communication using text to speech app.  Pt continues to demonstrate improved speech intelligibility and use of accurate motor speech patterns. He indicated today that he had worked very Metallurgist the  list of words contained within his HEP.   Pt continues with increased effort and completion of HEP. As a result, he has made steady progress towards his goals as well as improved his functional use of language. Will request re-certification for services.    OBJECTIVE IMPAIRMENTS include expressive language, receptive language, aphasia, and apraxia. These impairments are limiting patient from managing medications, managing appointments, managing finances, household responsibilities, ADLs/IADLs, and effectively communicating at home and in community. Factors affecting potential to achieve goals and functional outcome are severity of impairments. Patient will benefit from skilled SLP services to address above impairments and improve overall function.  REHAB POTENTIAL:  Excellent  PLAN: SLP FREQUENCY: 3x/week  SLP DURATION: 12 weeks  PLANNED INTERVENTIONS: Language facilitation, Environmental controls, Internal/external aids, Functional tasks, Multimodal communication approach, SLP instruction and feedback, Compensatory strategies, and Patient/family education    Tawny Raspberry B. Dreama Saa, M.S., CCC-SLP, Tree surgeon Certified Brain Injury Specialist [facility]  [facility] Rehabilitation Services Office [phone] Ascom [phone] Fax [phone]

## 2023-06-16 ENCOUNTER — Ambulatory Visit: Payer: Medicare HMO | Admitting: Speech Pathology

## 2023-06-16 DIAGNOSIS — I6602 Occlusion and stenosis of left middle cerebral artery: Secondary | ICD-10-CM

## 2023-06-16 DIAGNOSIS — R482 Apraxia: Secondary | ICD-10-CM | POA: Diagnosis not present

## 2023-06-16 DIAGNOSIS — R4701 Aphasia: Secondary | ICD-10-CM

## 2023-06-16 NOTE — Therapy (Signed)
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 Patient is making progress toward LTGs and met 2 STGs this reporting period. See skilled intervention, clinical impressions, and goals below for details.   END OF SESSION  End of Session - 06/16/23 1044     Visit Number 130    Number of Visits 165    Date for SLP Re-Evaluation 09/05/23    Authorization Type [employer] H[employer]    Authorization Time Period 03/21/2023 thru 07/04/2023    Authorization - Visit Number 28    Authorization - Number of Visits 40    Progress Note Due on Visit 130    SLP Start Time 1015    SLP Stop Time  1100    SLP Time Calculation (min) 45 min    Activity Tolerance Patient tolerated treatment well                   Past Medical History:  Diagnosis Date   CAD (coronary artery disease)    a. 10/2021 NSTEMI/PCI: LM nl, LAD min irregs, LCX 17m (2.5x18 Onyx Frontier DES), OM1 nl, RCA 83m (3.5x26 Onyx Frontier DES), 30d.   Diastolic dysfunction    a. 10/2021 Echo: EF 50-55%, no rwma, Gr1 DD, nl RV fxn. No significant valvular dzs.   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 SCCis. MOHs 07/08/22   Past Surgical History:  Procedure Laterality Date   COLONOSCOPY N/A 10/09/2015   Procedure: COLONOSCOPY;  Surgeon: Wallace Cullens, MD;  Location: [facility]  ENDOSCOPY;  Service: Gastroenterology;  Laterality: N/A;   CORONARY STENT INTERVENTION N/A 10/22/2021   Procedure: CORONARY STENT INTERVENTION;  Surgeon: Iran Ouch, MD;  Location: [facility] INVASIVE CV LAB;  Service: Cardiovascular;  Laterality: N/A;   IR CT HEAD LTD  05/21/2022   IR PERCUTANEOUS ART THROMBECTOMY/INFUSION INTRACRANIAL INC DIAG ANGIO  05/21/2022   LEFT HEART CATH AND CORONARY ANGIOGRAPHY N/A 10/22/2021   Procedure: LEFT HEART CATH AND CORONARY ANGIOGRAPHY;  Surgeon: Iran Ouch, MD;  Location: [facility] INVASIVE CV LAB;  Service: Cardiovascular;  Laterality: N/A;   LOOP RECORDER INSERTION N/A 05/23/2022   Procedure: LOOP RECORDER INSERTION;  Surgeon: Lanier Prude, MD;  Location: [facility] INVASIVE CV LAB;  Service: Cardiovascular;  Laterality: N/A;   RADIOLOGY WITH ANESTHESIA N/A 05/20/2022   Procedure: IR WITH ANESTHESIA;  Surgeon: Julieanne Cotton, MD;  Location: [facility] OR;  Service: Radiology;  Laterality: N/A;   ROTATOR CUFF REPAIR     Patient Active Problem List   Diagnosis Date Noted   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 male with PMH: HTN, CAD, HLD, osteoarthritis, SCC, sleep apnea. He presented to the hospital on 05/20/22 with right sided weakness and aphasia with LKW reported by wife to be 10pm on 10/2.  He arrived at [facility] as code stroke and taken for emergent CT/CTA which demonstrated left M1 occlusion. He received IV tenecteplase and IR thrombectomy due to large vessel occlusion.       DIAGNOSTIC FINDINGS:  MRI  05/22/2022 Cortical ischemia throughout the left MCA territory compatible with recent infarct. Relatively little affect on the white matter. 2. Small focus of acute hemorrhage along the anterior surface of the left temporal lobe Heidelberg classification 3c: Subarachnoid hemorrhage. 3. Normal intracranial MRA.  Restored flow in left MCA.    THERAPY DIAG:  Aphasia  Apraxia  Middle cerebral artery embolism, left  Rationale for Evaluation and Treatment Rehabilitation  SUBJECTIVE:   PAIN:  Are you having pain? No  PATIENT GOALS: to be able to communicate again  SUBJECTIVE STATEMENT: "His words are coming out easier at home" Pt accompanied by: self and significant other  OBJECTIVE:   TODAY'S TREATMENT: Skilled treatment session focused on pt's communication goals. SLP facilitated session by providing the following interventions:  TalkPath Therapy App utilized to target word finding deficits and apraxia of speech  Flash card Naming Nouns: Level 1 - 3 out of 16 independently improving to 12 out of 16 with moderate sentence completion cues - Level 2 - 1 out of 16 independently improving to 14 out of 16 with moderate sentence completion cues  Functional Repetition - Phrase Level 2 - 17 out of 20  independently with > 95% speech intelligibility - Phrase Level 3 - 16 out of 20 independently with > 95% speech ntelligibility  PATIENT EDUCATION: Education details: see above Person educated: Patient and Wife Education method: Explanation, Demonstration, Verbal cues, and Handouts Education comprehension: verbalized understanding  HOME EXERCISE PROGRAM: Practice functional phrases contained in Text to Speech App  GOALS: Goals reviewed with patient? Yes  SHORT TERM GOALS: Target date: 10 sessions   UPDATED: 06/13/2023 3.   Pt will improve wording finding abilities by writing 3 or more words during communication exchanges to increase listener understanding with moderate A in 5 out of  7 opportunities; ONGOING;   05/19/2023 - PROGRESS MADE - currently writing down 2-3 words with - progress made moderate assistance in 3 of 7 opportunities 5. Pt will improve spelling ability by accurately spelling SEMI-COMPLEX words during communication exchanges to increase listener understanding with min A in 5 out of 7 opportunities; ONGOING  05/19/2023 - PROGRESS MADE - currently working at [employer] A level in 3 out of 7 opportunities - progress made 6.  Pt will read self-generated words during communication exchanges with > 50% speech intelligibility given Min A articulatory cues; ONGOING - progress made  05/19/2023 - MET - upgraded to Pt will read self-generated words during communication exchanges with > 75% speech intelligibility given Min A articulatory cues - progress made  LONG TERM GOALS: Target date: 09/04/2022     Pt will use multimodal communication to express basic wants/needs in 4 of 10 opportunities.  Baseline: unable Goal status: ONGOING; MET when using combination of verbal, paper and pencil and Text to Speech App 05/19/2023 - Upgraded to Pt will use multimodal communication to express basic wants/needs  in 7 out of 10 opportunities. - progress made  2.  Pt will initiate communication in 3 of 5 opportunities as reported by caregiver outside of ST sessions.   Baseline: no initiation  Goal Status: Initial: ONGOING; his wife reports mild increase in attempts - progress made    ASSESSMENT:  CLINICAL IMPRESSION: Pt is a 77 year old male who was seen today for therapy targeting his aphasia. Pt with improved communication using text to speech app.  Pt continues to demonstrate improved speech intelligibility and use of accurate motor speech patterns as evidenced by his performance on Level 3 for functional phrase repetition as these phrases contained 4-5 words. See the above detailed treatment note.    OBJECTIVE IMPAIRMENTS include expressive language, receptive language, aphasia, and  apraxia. These impairments are limiting patient from managing medications, managing appointments, managing finances, household responsibilities, ADLs/IADLs, and effectively communicating at home and in community. Factors affecting potential to achieve goals and functional outcome are severity of impairments. Patient will benefit from skilled SLP services to address above impairments and improve overall function.  REHAB POTENTIAL: Excellent  PLAN: SLP FREQUENCY: 3x/week  SLP DURATION: 12 weeks  PLANNED INTERVENTIONS: Language facilitation, Environmental controls, Internal/external aids, Functional tasks, Multimodal communication approach, SLP instruction and feedback, Compensatory strategies, and Patient/family education    Tieasha Larsen B. Dreama Saa, M.S., CCC-SLP, Tree surgeon Certified Brain Injury Specialist [facility]  [facility] Rehabilitation Services Office [phone] Ascom [phone] Fax [phone]

## 2023-06-16 NOTE — Progress Notes (Signed)
[employer] Loop Recorder 

## 2023-06-18 ENCOUNTER — Ambulatory Visit: Payer: Medicare HMO | Admitting: Speech Pathology

## 2023-06-18 DIAGNOSIS — R482 Apraxia: Secondary | ICD-10-CM | POA: Diagnosis not present

## 2023-06-18 DIAGNOSIS — I6602 Occlusion and stenosis of left middle cerebral artery: Secondary | ICD-10-CM | POA: Diagnosis not present

## 2023-06-18 DIAGNOSIS — R4701 Aphasia: Secondary | ICD-10-CM

## 2023-06-18 NOTE — Therapy (Addendum)
OUTPATIENT SPEECH LANGUAGE PATHOLOGY TREATMENT NOTE   Patient Name: NAPOLEAN SIA MRN: [id number] DOB:Jul 07, 1946, 77 y.o., male 74 Date: 06/18/2023  PCP: Daniel Nones, MD REFERRING PROVIDER: Daniel Nones, MD    END OF SESSION  End of Session - 06/18/23 1123     Visit Number 131    Number of Visits 165    Date for SLP Re-Evaluation 09/05/23    Authorization Type [employer] H[employer]    Authorization Time Period 03/21/2023 thru 07/04/2023    Authorization - Visit Number 29    Authorization - Number of Visits 40    Progress Note Due on Visit 140    SLP Start Time 1015   SLP Stop Time  1100   SLP Time Calculation (min) 45 min    Activity Tolerance Patient tolerated treatment well                   Past Medical History:  Diagnosis Date   CAD (coronary artery disease)    a. 10/2021 NSTEMI/PCI: LM nl, LAD min irregs, LCX 64m (2.5x18 Onyx Frontier DES), OM1 nl, RCA 33m (3.5x26 Onyx Frontier DES), 30d.   Diastolic dysfunction    a. 10/2021 Echo: EF 50-55%, no rwma, Gr1 DD, nl RV fxn. No significant valvular dzs.   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 SCCis. MOHs 07/08/22   Past Surgical History:  Procedure Laterality Date   COLONOSCOPY N/A 10/09/2015   Procedure: COLONOSCOPY;  Surgeon: Wallace Cullens, MD;  Location: [facility] ENDOSCOPY;  Service: Gastroenterology;  Laterality: N/A;   CORONARY STENT INTERVENTION N/A 10/22/2021   Procedure: CORONARY STENT INTERVENTION;  Surgeon: Iran Ouch, MD;  Location: [facility] INVASIVE CV LAB;  Service: Cardiovascular;  Laterality: N/A;   IR CT HEAD LTD  05/21/2022   IR PERCUTANEOUS ART THROMBECTOMY/INFUSION INTRACRANIAL INC DIAG ANGIO  05/21/2022   LEFT HEART CATH AND CORONARY ANGIOGRAPHY N/A 10/22/2021   Procedure: LEFT HEART  CATH AND CORONARY ANGIOGRAPHY;  Surgeon: Iran Ouch, MD;  Location: [facility] INVASIVE CV LAB;  Service: Cardiovascular;  Laterality: N/A;   LOOP RECORDER INSERTION N/A 05/23/2022   Procedure: LOOP RECORDER INSERTION;  Surgeon: Lanier Prude, MD;  Location: [facility] INVASIVE CV LAB;  Service: Cardiovascular;  Laterality: N/A;   RADIOLOGY WITH ANESTHESIA N/A 05/20/2022   Procedure: IR WITH ANESTHESIA;  Surgeon: Julieanne Cotton, MD;  Location: [facility] OR;  Service: Radiology;  Laterality: N/A;   ROTATOR CUFF REPAIR     Patient Active Problem List   Diagnosis Date Noted   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 male with PMH: HTN, CAD, HLD, osteoarthritis, SCC, sleep apnea. He presented to the hospital on 05/20/22 with right sided weakness and aphasia with LKW reported by wife to be 10pm on 10/2.  He arrived at  [facility] as code stroke and taken for emergent CT/CTA which demonstrated left M1 occlusion. He received IV tenecteplase and IR thrombectomy due to large vessel occlusion.       DIAGNOSTIC FINDINGS:  MRI 05/22/2022 Cortical ischemia throughout the left MCA territory compatible with recent infarct. Relatively little affect on the white matter. 2. Small focus of acute hemorrhage along the anterior surface of the left temporal lobe Heidelberg classification 3c: Subarachnoid hemorrhage. 3. Normal intracranial MRA.  Restored flow in left MCA.    THERAPY DIAG:  Aphasia  Apraxia  Middle cerebral artery embolism, left  Rationale for  Evaluation and Treatment Rehabilitation  SUBJECTIVE:   PAIN:  Are you having pain? No  PATIENT GOALS: to be able to communicate again  SUBJECTIVE STATEMENT: "We brought you something" Pt accompanied by: self and significant other  OBJECTIVE:   TODAY'S TREATMENT: Skilled treatment session focused on pt's communication goals. SLP facilitated session by providing the following interventions:  TalkPath Therapy App utilized to target word finding deficits and apraxia of speech  Functional Repetition -  Phrase Level 3 - 32 out of 40 independently with > 95% speech intelligibility - Phrase Level 4 - 17 out of 20 independently with > 95% speech intelligibility  SLP further facilitated session by providing moderate assistance to achieve > 95% speech intelligibility when producing /n/ words with /n/ in initial, medial and final positions  PATIENT EDUCATION: Education details: see above Person educated: Patient and Wife Education method: Explanation, Demonstration, Verbal cues, and Handouts Education comprehension: verbalized understanding  HOME EXERCISE PROGRAM: Practice functional phrases contained in Text to Speech App Word lists  GOALS: Goals reviewed with patient? Yes  SHORT TERM GOALS: Target date: 10 sessions   UPDATED: 06/13/2023 3.   Pt will improve wording finding abilities by writing 3 or more words during communication exchanges to increase listener understanding with moderate A in 5 out of 7 opportunities; ONGOING;   05/19/2023 - PROGRESS MADE - currently writing down 2-3 words with - progress made moderate assistance in 3 of 7 opportunities 5. Pt will improve spelling ability by accurately spelling SEMI-COMPLEX words during communication exchanges to increase listener understanding with min A in 5 out of 7 opportunities; ONGOING  05/19/2023 - PROGRESS MADE - currently working at [employer] A level in 3 out of 7 opportunities - progress made 6.  Pt will read self-generated words  during communication exchanges with > 50% speech intelligibility given Min A articulatory cues; ONGOING - progress made  05/19/2023 - MET - upgraded to Pt will read self-generated words during communication exchanges with > 75% speech intelligibility given Min A articulatory cues - progress made  LONG TERM GOALS: Target date: 09/04/2022     Pt will use multimodal communication to express basic wants/needs in 4 of 10 opportunities.  Baseline: unable Goal status: ONGOING; MET when using combination of verbal, paper and pencil and Text to Speech App 05/19/2023 - Upgraded to Pt will use multimodal communication to express basic wants/needs in 7 out of 10 opportunities. - progress made  2.  Pt will initiate communication in 3 of 5 opportunities as reported by caregiver outside of ST sessions.   Baseline: no initiation  Goal Status: Initial: ONGOING; his wife reports mild increase in attempts - progress made    ASSESSMENT:  CLINICAL IMPRESSION: Pt is a 77 year old male who was seen today for therapy targeting his aphasia. Pt with improved communication using text to speech app.  Pt continues to demonstrate improved  speech intelligibility and use of accurate motor speech patterns as evidenced by his performance on Level 3 and progressing to level 4 during today's session for functional phrase repetition as these phrases contained 4-5 words. See the above detailed treatment note.    OBJECTIVE IMPAIRMENTS include expressive language, receptive language, aphasia, and apraxia. These impairments are limiting patient from managing medications, managing appointments, managing finances, household responsibilities, ADLs/IADLs, and effectively communicating at home and in community. Factors affecting potential to achieve goals and functional outcome are severity of impairments. Patient will benefit from skilled SLP services to address above impairments and improve overall function.  REHAB POTENTIAL:  Excellent  PLAN: SLP FREQUENCY: 3x/week  SLP DURATION: 12 weeks  PLANNED INTERVENTIONS: Language facilitation, Environmental controls, Internal/external aids, Functional tasks, Multimodal communication approach, SLP instruction and feedback, Compensatory strategies, and Patient/family education    Jannat Rosemeyer B. Dreama Saa, M.S., CCC-SLP, Tree surgeon Certified Brain Injury Specialist [facility]  [facility] Rehabilitation Services Office [phone] Ascom [phone] Fax [phone]

## 2023-06-20 ENCOUNTER — Ambulatory Visit: Payer: Medicare HMO | Admitting: Speech Pathology

## 2023-06-20 ENCOUNTER — Ambulatory Visit: Payer: Medicare HMO | Attending: Internal Medicine | Admitting: Speech Pathology

## 2023-06-20 DIAGNOSIS — R4701 Aphasia: Secondary | ICD-10-CM | POA: Diagnosis not present

## 2023-06-20 DIAGNOSIS — I6602 Occlusion and stenosis of left middle cerebral artery: Secondary | ICD-10-CM | POA: Insufficient documentation

## 2023-06-20 DIAGNOSIS — R482 Apraxia: Secondary | ICD-10-CM | POA: Insufficient documentation

## 2023-06-20 NOTE — Therapy (Unsigned)
OUTPATIENT SPEECH LANGUAGE PATHOLOGY TREATMENT NOTE   Patient Name: Alan Stewart MRN: [id number] DOB:04-24-1946, 77 y.o., male 54 Date: 06/20/2023  PCP: Daniel Nones, MD REFERRING PROVIDER: Daniel Nones, MD    END OF SESSION  End of Session - 06/20/23 1853     Visit Number 132    Number of Visits 165    Date for SLP Re-Evaluation 09/05/23    Authorization Type [employer] H[employer]    Authorization Time Period 03/21/2023 thru 07/04/2023    Authorization - Visit Number 30    Authorization - Number of Visits 40    Progress Note Due on Visit 140    SLP Start Time 1015    SLP Stop Time  1100    SLP Time Calculation (min) 45 min    Activity Tolerance Patient tolerated treatment well                   Past Medical History:  Diagnosis Date   CAD (coronary artery disease)    a. 10/2021 NSTEMI/PCI: LM nl, LAD min irregs, LCX 34m (2.5x18 Onyx Frontier DES), OM1 nl, RCA 83m (3.5x26 Onyx Frontier DES), 30d.   Diastolic dysfunction    a. 10/2021 Echo: EF 50-55%, no rwma, Gr1 DD, nl RV fxn. No significant valvular dzs.   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 SCCis. MOHs 07/08/22   Past Surgical History:  Procedure Laterality Date   COLONOSCOPY N/A 10/09/2015   Procedure: COLONOSCOPY;  Surgeon: Wallace Cullens, MD;  Location: [facility] ENDOSCOPY;  Service: Gastroenterology;  Laterality: N/A;   CORONARY STENT INTERVENTION N/A 10/22/2021   Procedure: CORONARY STENT INTERVENTION;  Surgeon: Iran Ouch, MD;  Location: [facility] INVASIVE CV LAB;  Service: Cardiovascular;  Laterality: N/A;   IR CT HEAD LTD  05/21/2022   IR PERCUTANEOUS ART THROMBECTOMY/INFUSION INTRACRANIAL INC DIAG ANGIO  05/21/2022   LEFT HEART CATH AND CORONARY ANGIOGRAPHY N/A 10/22/2021   Procedure: LEFT HEART  CATH AND CORONARY ANGIOGRAPHY;  Surgeon: Iran Ouch, MD;  Location: [facility] INVASIVE CV LAB;  Service: Cardiovascular;  Laterality: N/A;   LOOP RECORDER INSERTION N/A 05/23/2022   Procedure: LOOP RECORDER INSERTION;  Surgeon: Lanier Prude, MD;  Location: [facility] INVASIVE CV LAB;  Service: Cardiovascular;  Laterality: N/A;   RADIOLOGY WITH ANESTHESIA N/A 05/20/2022   Procedure: IR WITH ANESTHESIA;  Surgeon: Julieanne Cotton, MD;  Location: [facility] OR;  Service: Radiology;  Laterality: N/A;   ROTATOR CUFF REPAIR     Patient Active Problem List   Diagnosis Date Noted   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 male with PMH: HTN, CAD, HLD, osteoarthritis, SCC, sleep apnea. He presented to the hospital on 05/20/22 with right sided weakness and aphasia with LKW reported by wife to be 10pm on 10/2.  He  arrived at [facility] as code stroke and taken for emergent CT/CTA which demonstrated left M1 occlusion. He received IV tenecteplase and IR thrombectomy due to large vessel occlusion.       DIAGNOSTIC FINDINGS:  MRI 05/22/2022 Cortical ischemia throughout the left MCA territory compatible with recent infarct. Relatively little affect on the white matter. 2. Small focus of acute hemorrhage along the anterior surface of the left temporal lobe Heidelberg classification 3c: Subarachnoid hemorrhage. 3. Normal intracranial MRA.  Restored flow in left MCA.    THERAPY DIAG:  Aphasia  Apraxia  Middle cerebral artery embolism, left  Rationale for  Evaluation and Treatment Rehabilitation  SUBJECTIVE:   PAIN:  Are you having pain? No  PATIENT GOALS: to be able to communicate again  SUBJECTIVE STATEMENT: "We brought you something" Pt accompanied by: self and significant other  OBJECTIVE:   TODAY'S TREATMENT: Skilled treatment session focused on pt's communication goals. SLP facilitated session by providing the following interventions:  TalkPath Therapy App utilized to target word finding deficits and apraxia of speech  Functional Repetition -  Phrase Level 3 - 32 out of 40 independently with > 95% speech intelligibility - Phrase Level 4 - 17 out of 20 independently with > 95% speech intelligibility  SLP further facilitated session by providing moderate assistance to achieve > 95% speech intelligibility when producing /n/ words with /n/ in initial, medial and final positions  PATIENT EDUCATION: Education details: see above Person educated: Patient and Wife Education method: Explanation, Demonstration, Verbal cues, and Handouts Education comprehension: verbalized understanding  HOME EXERCISE PROGRAM: Practice functional phrases contained in Text to Speech App Word lists  GOALS: Goals reviewed with patient? Yes  SHORT TERM GOALS: Target date: 10 sessions   UPDATED: 06/13/2023 3.   Pt will improve wording finding abilities by writing 3 or more words during communication exchanges to increase listener understanding with moderate A in 5 out of 7 opportunities; ONGOING;   05/19/2023 - PROGRESS MADE - currently writing down 2-3 words with - progress made moderate assistance in 3 of 7 opportunities 5. Pt will improve spelling ability by accurately spelling SEMI-COMPLEX words during communication exchanges to increase listener understanding with min A in 5 out of 7 opportunities; ONGOING  05/19/2023 - PROGRESS MADE - currently working at [employer] A level in 3 out of 7 opportunities - progress made 6.  Pt will read self-generated words  during communication exchanges with > 50% speech intelligibility given Min A articulatory cues; ONGOING - progress made  05/19/2023 - MET - upgraded to Pt will read self-generated words during communication exchanges with > 75% speech intelligibility given Min A articulatory cues - progress made  LONG TERM GOALS: Target date: 09/04/2022     Pt will use multimodal communication to express basic wants/needs in 4 of 10 opportunities.  Baseline: unable Goal status: ONGOING; MET when using combination of verbal, paper and pencil and Text to Speech App 05/19/2023 - Upgraded to Pt will use multimodal communication to express basic wants/needs in 7 out of 10 opportunities. - progress made  2.  Pt will initiate communication in 3 of 5 opportunities as reported by caregiver outside of ST sessions.   Baseline: no initiation  Goal Status: Initial: ONGOING; his wife reports mild increase in attempts - progress made    ASSESSMENT:  CLINICAL IMPRESSION: Pt is a 77 year old male who was seen today for therapy targeting his aphasia. Pt with improved communication using text to speech app.  Pt continues to  demonstrate improved speech intelligibility and use of accurate motor speech patterns as evidenced by his performance on Level 3 and progressing to level 4 during today's session for functional phrase repetition as these phrases contained 4-5 words. See the above detailed treatment note.    OBJECTIVE IMPAIRMENTS include expressive language, receptive language, aphasia, and apraxia. These impairments are limiting patient from managing medications, managing appointments, managing finances, household responsibilities, ADLs/IADLs, and effectively communicating at home and in community. Factors affecting potential to achieve goals and functional outcome are severity of impairments. Patient will benefit from skilled SLP services to address above impairments and improve overall function.  REHAB POTENTIAL:  Excellent  PLAN: SLP FREQUENCY: 3x/week  SLP DURATION: 12 weeks  PLANNED INTERVENTIONS: Language facilitation, Environmental controls, Internal/external aids, Functional tasks, Multimodal communication approach, SLP instruction and feedback, Compensatory strategies, and Patient/family education    Dawnisha Marquina B. Dreama Saa, M.S., CCC-SLP, Tree surgeon Certified Brain Injury Specialist [facility]  [facility] Rehabilitation Services Office [phone] Ascom [phone] Fax [phone]

## 2023-06-23 ENCOUNTER — Ambulatory Visit: Payer: Medicare HMO | Admitting: Speech Pathology

## 2023-06-23 DIAGNOSIS — I6602 Occlusion and stenosis of left middle cerebral artery: Secondary | ICD-10-CM

## 2023-06-23 DIAGNOSIS — R4701 Aphasia: Secondary | ICD-10-CM | POA: Diagnosis not present

## 2023-06-23 DIAGNOSIS — R482 Apraxia: Secondary | ICD-10-CM

## 2023-06-23 NOTE — Therapy (Signed)
OUTPATIENT SPEECH LANGUAGE PATHOLOGY TREATMENT NOTE   Patient Name: Alan Stewart MRN: [id number] DOB:[birth year], 77 y.o., male 68 Date: 06/23/2023  PCP: Daniel Nones, MD REFERRING PROVIDER: Daniel Nones, MD    END OF SESSION  End of Session - 06/23/23 1036     Visit Number 133    Number of Visits 165    Date for SLP Re-Evaluation 09/05/23    Authorization Type [employer] H[employer]    Authorization Time Period 03/21/2023 thru 07/04/2023    Authorization - Visit Number 31    Authorization - Number of Visits 40    Progress Note Due on Visit 140    SLP Start Time 1015    SLP Stop Time  1100    SLP Time Calculation (min) 45 min    Activity Tolerance Patient tolerated treatment well                   Past Medical History:  Diagnosis Date   CAD (coronary artery disease)    a. 10/2021 NSTEMI/PCI: LM nl, LAD min irregs, LCX 73m (2.5x18 Onyx Frontier DES), OM1 nl, RCA 27m (3.5x26 Onyx Frontier DES), 30d.   Diastolic dysfunction    a. 10/2021 Echo: EF 50-55%, no rwma, Gr1 DD, nl RV fxn. No significant valvular dzs.   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 SCCis. MOHs 07/08/22   Past Surgical History:  Procedure Laterality Date   COLONOSCOPY N/A 10/09/2015   Procedure: COLONOSCOPY;  Surgeon: Wallace Cullens, MD;  Location: [facility] ENDOSCOPY;  Service: Gastroenterology;  Laterality: N/A;   CORONARY STENT INTERVENTION N/A 10/22/2021   Procedure: CORONARY STENT INTERVENTION;  Surgeon: Iran Ouch, MD;  Location: [facility] INVASIVE CV LAB;  Service: Cardiovascular;  Laterality: N/A;   IR CT HEAD LTD  05/21/2022   IR PERCUTANEOUS ART THROMBECTOMY/INFUSION INTRACRANIAL INC DIAG ANGIO  05/21/2022   LEFT HEART CATH AND CORONARY ANGIOGRAPHY N/A 10/22/2021   Procedure: LEFT HEART  CATH AND CORONARY ANGIOGRAPHY;  Surgeon: Iran Ouch, MD;  Location: [facility] INVASIVE CV LAB;  Service: Cardiovascular;  Laterality: N/A;   LOOP RECORDER INSERTION N/A 05/23/2022   Procedure: LOOP RECORDER INSERTION;  Surgeon: Lanier Prude, MD;  Location: [facility] INVASIVE CV LAB;  Service: Cardiovascular;  Laterality: N/A;   RADIOLOGY WITH ANESTHESIA N/A 05/20/2022   Procedure: IR WITH ANESTHESIA;  Surgeon: Julieanne Cotton, MD;  Location: [facility] OR;  Service: Radiology;  Laterality: N/A;   ROTATOR CUFF REPAIR     Patient Active Problem List   Diagnosis Date Noted   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 male with PMH: HTN, CAD, HLD, osteoarthritis, SCC, sleep apnea. He presented to the hospital on 05/20/22 with right sided weakness and aphasia with LKW reported by wife to be 10pm on 10/2.  He  arrived at [facility] as code stroke and taken for emergent CT/CTA which demonstrated left M1 occlusion. He received IV tenecteplase and IR thrombectomy due to large vessel occlusion.       DIAGNOSTIC FINDINGS:  MRI 05/22/2022 Cortical ischemia throughout the left MCA territory compatible with recent infarct. Relatively little affect on the white matter. 2. Small focus of acute hemorrhage along the anterior surface of the left temporal lobe Heidelberg classification 3c: Subarachnoid hemorrhage. 3. Normal intracranial MRA.  Restored flow in left MCA.    THERAPY DIAG:  Aphasia  Apraxia  Middle cerebral artery embolism, left  Rationale for  Evaluation and Treatment Rehabilitation  SUBJECTIVE:   PAIN:  Are you having pain? No  PATIENT GOALS: to be able to communicate again  SUBJECTIVE STATEMENT: Pt smiling because his [employer] team won over the weekend and his wife's team lost Pt accompanied by: self and significant other  OBJECTIVE:   TODAY'S TREATMENT: Skilled treatment session focused on pt's communication goals. SLP facilitated session by providing the following interventions:  SLP further facilitated session by providing minimal assistance to achieve > 95% speech intelligibility when producing /n/ words with /n/ in initial, medial and final positions as well as word list containing /s/ phrases  SLP further session by providing intermittent Min A to complete page 43 of Targeting Word Retrieval Categories  to list 5 items that could be found at each place listed. Some stores included [employer], [employer], [employer] - pt able to list 5 items in each of 8 categories with intermittent Min a and he was able to say each item with > 95% speech intelligibility   PATIENT EDUCATION: Education details: see above Person educated: Patient and Wife Education method: Explanation, Demonstration, Verbal cues, and Handouts Education comprehension: verbalized understanding  HOME EXERCISE PROGRAM: Practice functional phrases contained in Text to Speech App Word lists  GOALS: Goals reviewed with patient? Yes  SHORT TERM GOALS: Target date: 10 sessions   UPDATED: 06/13/2023 3.   Pt will improve wording finding abilities by writing 3 or more words during communication exchanges to increase listener understanding with moderate A in 5 out of 7 opportunities; ONGOING;   05/19/2023 - PROGRESS MADE - currently writing down 2-3 words with - progress made moderate assistance in 3 of 7 opportunities 5. Pt will improve spelling ability by accurately spelling SEMI-COMPLEX words during communication exchanges to increase listener understanding with min  A in 5 out of 7 opportunities; ONGOING  05/19/2023 - PROGRESS MADE - currently working at [employer] A level in 3 out of 7 opportunities - progress made 6.  Pt will read self-generated words during communication exchanges with > 50% speech intelligibility given Min A articulatory cues; ONGOING - progress made  05/19/2023 - MET - upgraded to Pt will read self-generated words during communication exchanges with > 75% speech intelligibility given Min A articulatory cues - progress made  LONG TERM GOALS: Target date: 09/04/2022     Pt will use multimodal communication to express basic wants/needs in 4 of 10 opportunities.  Baseline: unable Goal status: ONGOING; MET when using combination of verbal, paper and pencil and Text to Speech App 05/19/2023 - Upgraded to Pt will use multimodal communication to express basic wants/needs in 7 out of 10 opportunities. - progress made  2.  Pt will initiate communication in 3 of 5 opportunities as reported by caregiver outside of ST sessions.   Baseline: no initiation  Goal Status: Initial: ONGOING; his wife reports mild increase in attempts -  progress made    ASSESSMENT:  CLINICAL IMPRESSION: Pt is a 77 year old male who was seen today for therapy targeting his aphasia. Pt with improved communication using text to speech app.  Pt continues to demonstrate improved speech intelligibility and use of accurate motor speech patterns. See the above detailed treatment note.    OBJECTIVE IMPAIRMENTS include expressive language, receptive language, aphasia, and apraxia. These impairments are limiting patient from managing medications, managing appointments, managing finances, household responsibilities, ADLs/IADLs, and effectively communicating at home and in community. Factors affecting potential to achieve goals and functional outcome are severity of impairments. Patient will benefit from skilled SLP services to address above impairments and improve overall  function.  REHAB POTENTIAL: Excellent  PLAN: SLP FREQUENCY: 3x/week  SLP DURATION: 12 weeks  PLANNED INTERVENTIONS: Language facilitation, Environmental controls, Internal/external aids, Functional tasks, Multimodal communication approach, SLP instruction and feedback, Compensatory strategies, and Patient/family education    Berle Fitz B. Dreama Saa, M.S., CCC-SLP, Tree surgeon Certified Brain Injury Specialist [facility]  [facility] Rehabilitation Services Office [phone] Ascom [phone] Fax [phone]

## 2023-06-25 ENCOUNTER — Ambulatory Visit: Payer: Medicare HMO | Admitting: Speech Pathology

## 2023-06-25 DIAGNOSIS — I6602 Occlusion and stenosis of left middle cerebral artery: Secondary | ICD-10-CM

## 2023-06-25 DIAGNOSIS — R482 Apraxia: Secondary | ICD-10-CM | POA: Diagnosis not present

## 2023-06-25 DIAGNOSIS — R4701 Aphasia: Secondary | ICD-10-CM

## 2023-06-25 NOTE — Therapy (Signed)
OUTPATIENT SPEECH LANGUAGE PATHOLOGY TREATMENT NOTE   Patient Name: Alan Stewart MRN: [id number] DOB:March 01, 1946, 77 y.o., male 33 Date: 06/25/2023  PCP: Daniel Nones, MD REFERRING PROVIDER: Daniel Nones, MD    END OF SESSION  End of Session - 06/25/23 1013     Visit Number 134    Number of Visits 165    Date for SLP Re-Evaluation 09/05/23    Authorization Type [employer] H[employer]    Authorization Time Period 03/21/2023 thru 07/04/2023    Authorization - Visit Number 32    Authorization - Number of Visits 40    Progress Note Due on Visit 140    SLP Start Time 1015    SLP Stop Time  1100    SLP Time Calculation (min) 45 min    Activity Tolerance Patient tolerated treatment well                   Past Medical History:  Diagnosis Date   CAD (coronary artery disease)    a. 10/2021 NSTEMI/PCI: LM nl, LAD min irregs, LCX 54m (2.5x18 Onyx Frontier DES), OM1 nl, RCA 8m (3.5x26 Onyx Frontier DES), 30d.   Diastolic dysfunction    a. 10/2021 Echo: EF 50-55%, no rwma, Gr1 DD, nl RV fxn. No significant valvular dzs.   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 SCCis. MOHs 07/08/22   Past Surgical History:  Procedure Laterality Date   COLONOSCOPY N/A 10/09/2015   Procedure: COLONOSCOPY;  Surgeon: Wallace Cullens, MD;  Location: [facility] ENDOSCOPY;  Service: Gastroenterology;  Laterality: N/A;   CORONARY STENT INTERVENTION N/A 10/22/2021   Procedure: CORONARY STENT INTERVENTION;  Surgeon: Iran Ouch, MD;  Location: [facility] INVASIVE CV LAB;  Service: Cardiovascular;  Laterality: N/A;   IR CT HEAD LTD  05/21/2022   IR PERCUTANEOUS ART THROMBECTOMY/INFUSION INTRACRANIAL INC DIAG ANGIO  05/21/2022   LEFT HEART CATH AND CORONARY ANGIOGRAPHY N/A 10/22/2021   Procedure: LEFT HEART  CATH AND CORONARY ANGIOGRAPHY;  Surgeon: Iran Ouch, MD;  Location: [facility] INVASIVE CV LAB;  Service: Cardiovascular;  Laterality: N/A;   LOOP RECORDER INSERTION N/A 05/23/2022   Procedure: LOOP RECORDER INSERTION;  Surgeon: Lanier Prude, MD;  Location: [facility] INVASIVE CV LAB;  Service: Cardiovascular;  Laterality: N/A;   RADIOLOGY WITH ANESTHESIA N/A 05/20/2022   Procedure: IR WITH ANESTHESIA;  Surgeon: Julieanne Cotton, MD;  Location: [facility] OR;  Service: Radiology;  Laterality: N/A;   ROTATOR CUFF REPAIR     Patient Active Problem List   Diagnosis Date Noted   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 male with PMH: HTN, CAD, HLD, osteoarthritis, SCC, sleep apnea. He presented to the hospital on 05/20/22 with right sided weakness and aphasia with LKW reported by wife to be 10pm on 10/2.  He  arrived at [facility] as code stroke and taken for emergent CT/CTA which demonstrated left M1 occlusion. He received IV tenecteplase and IR thrombectomy due to large vessel occlusion.       DIAGNOSTIC FINDINGS:  MRI 05/22/2022 Cortical ischemia throughout the left MCA territory compatible with recent infarct. Relatively little affect on the white matter. 2. Small focus of acute hemorrhage along the anterior surface of the left temporal lobe Heidelberg classification 3c: Subarachnoid hemorrhage. 3. Normal intracranial MRA.  Restored flow in left MCA.    THERAPY DIAG:  Aphasia  Apraxia  Middle cerebral artery embolism, left  Rationale for  Evaluation and Treatment Rehabilitation  SUBJECTIVE:   PAIN:  Are you having pain? No  PATIENT GOALS: to be able to communicate again  SUBJECTIVE STATEMENT: "He did so good, he typed what he wanted - which kind of doughnut he wanted this morning" Pt accompanied by: self and significant other  OBJECTIVE:   TODAY'S TREATMENT: Skilled treatment session focused on pt's communication goals. SLP facilitated session by providing the following interventions:  Pt conveyed that he was tired for reading word lists, therefore SLP facilitated session by having pt count and name quantities during semi-complex activity with distractions present. Pt able to name accurately with 95% and rare min A.    PATIENT EDUCATION: Education details: see above Person educated: Patient and Wife Education method: Explanation, Facilities manager, Verbal cues, and Handouts Education comprehension: verbalized understanding  HOME EXERCISE PROGRAM: Practice functional phrases contained in Text to Speech App Word lists  GOALS: Goals reviewed with patient? Yes  SHORT TERM GOALS: Target date: 10 sessions   UPDATED: 06/13/2023 3.   Pt will improve wording finding abilities by writing 3 or more words during communication exchanges to increase listener understanding with moderate A in 5 out of 7 opportunities; ONGOING;   05/19/2023 - PROGRESS MADE - currently writing down 2-3 words with - progress made moderate assistance in 3 of 7 opportunities 5. Pt will improve spelling ability by accurately spelling SEMI-COMPLEX words during communication exchanges to increase listener understanding with min A in 5 out of 7 opportunities; ONGOING  05/19/2023 - PROGRESS MADE - currently working at [employer] A level in 3 out of 7 opportunities - progress made 6.  Pt will read self-generated words during communication exchanges with > 50% speech intelligibility given Min A articulatory cues; ONGOING - progress made  05/19/2023 - MET - upgraded  to Pt will read self-generated words during communication exchanges with > 75% speech intelligibility given Min A articulatory cues - progress made  LONG TERM GOALS: Target date: 09/04/2022     Pt will use multimodal communication to express basic wants/needs in 4 of 10 opportunities.  Baseline: unable Goal status: ONGOING; MET when using combination of verbal, paper and pencil and Text to Speech App 05/19/2023 - Upgraded to Pt will use multimodal communication to express basic wants/needs in 7 out of 10 opportunities. - progress made  2.  Pt will initiate communication in 3 of 5 opportunities as reported by caregiver outside of ST sessions.   Baseline: no initiation  Goal Status: Initial: ONGOING; his wife reports mild increase in attempts - progress made    ASSESSMENT:  CLINICAL IMPRESSION: Pt is a 77 year old male who was seen today for therapy targeting his aphasia. Pt with improved communication using text to speech app.  Pt continues to demonstrate improved speech intelligibility and use of accurate motor speech patterns. During today's session and during functional activities, pt is  using increased multimodal means of communication. See the above detailed treatment note.    OBJECTIVE IMPAIRMENTS include expressive language, receptive language, aphasia, and apraxia. These impairments are limiting patient from managing medications, managing appointments, managing finances, household responsibilities, ADLs/IADLs, and effectively communicating at home and in community. Factors affecting potential to achieve goals and functional outcome are severity of impairments. Patient will benefit from skilled SLP services to address above impairments and improve overall function.  REHAB POTENTIAL: Excellent  PLAN: SLP FREQUENCY: 3x/week  SLP DURATION: 12 weeks  PLANNED INTERVENTIONS: Language facilitation, Environmental controls, Internal/external aids, Functional tasks, Multimodal communication  approach, SLP instruction and feedback, Compensatory strategies, and Patient/family education    Maybelline Kolarik B. Dreama Saa, M.S., CCC-SLP, Tree surgeon Certified Brain Injury Specialist [facility]  [facility] Rehabilitation Services Office [phone] Ascom [phone] Fax [phone]

## 2023-06-27 ENCOUNTER — Ambulatory Visit: Payer: Medicare HMO | Admitting: Speech Pathology

## 2023-06-27 DIAGNOSIS — R4701 Aphasia: Secondary | ICD-10-CM | POA: Diagnosis not present

## 2023-06-27 DIAGNOSIS — R482 Apraxia: Secondary | ICD-10-CM | POA: Diagnosis not present

## 2023-06-27 DIAGNOSIS — I6602 Occlusion and stenosis of left middle cerebral artery: Secondary | ICD-10-CM

## 2023-06-27 NOTE — Therapy (Signed)
OUTPATIENT SPEECH LANGUAGE PATHOLOGY TREATMENT NOTE   Patient Name: Alan Stewart MRN: [id number] DOB:1946-03-16, 77 y.o., male 84 Date: 06/27/2023  PCP: Daniel Nones, MD REFERRING PROVIDER: Daniel Nones, MD    END OF SESSION  End of Session - 06/27/23 1312     Visit Number 135    Number of Visits 165    Date for SLP Re-Evaluation 09/05/23    Authorization Type [employer] H[employer]    Authorization Time Period 03/21/2023 thru 07/04/2023    Authorization - Visit Number 33    Authorization - Number of Visits 40    Progress Note Due on Visit 140    SLP Start Time 1015    SLP Stop Time  1100    SLP Time Calculation (min) 45 min    Activity Tolerance Patient tolerated treatment well                   Past Medical History:  Diagnosis Date   CAD (coronary artery disease)    a. 10/2021 NSTEMI/PCI: LM nl, LAD min irregs, LCX 51m (2.5x18 Onyx Frontier DES), OM1 nl, RCA 77m (3.5x26 Onyx Frontier DES), 30d.   Diastolic dysfunction    a. 10/2021 Echo: EF 50-55%, no rwma, Gr1 DD, nl RV fxn. No significant valvular dzs.   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 SCCis. MOHs 07/08/22   Past Surgical History:  Procedure Laterality Date   COLONOSCOPY N/A 10/09/2015   Procedure: COLONOSCOPY;  Surgeon: Wallace Cullens, MD;  Location: [facility] ENDOSCOPY;  Service: Gastroenterology;  Laterality: N/A;   CORONARY STENT INTERVENTION N/A 10/22/2021   Procedure: CORONARY STENT INTERVENTION;  Surgeon: Iran Ouch, MD;  Location: [facility] INVASIVE CV LAB;  Service: Cardiovascular;  Laterality: N/A;   IR CT HEAD LTD  05/21/2022   IR PERCUTANEOUS ART THROMBECTOMY/INFUSION INTRACRANIAL INC DIAG ANGIO  05/21/2022   LEFT HEART CATH AND CORONARY ANGIOGRAPHY N/A 10/22/2021   Procedure: LEFT HEART  CATH AND CORONARY ANGIOGRAPHY;  Surgeon: Iran Ouch, MD;  Location: [facility] INVASIVE CV LAB;  Service: Cardiovascular;  Laterality: N/A;   LOOP RECORDER INSERTION N/A 05/23/2022   Procedure: LOOP RECORDER INSERTION;  Surgeon: Lanier Prude, MD;  Location: [facility] INVASIVE CV LAB;  Service: Cardiovascular;  Laterality: N/A;   RADIOLOGY WITH ANESTHESIA N/A 05/20/2022   Procedure: IR WITH ANESTHESIA;  Surgeon: Julieanne Cotton, MD;  Location: [facility] OR;  Service: Radiology;  Laterality: N/A;   ROTATOR CUFF REPAIR     Patient Active Problem List   Diagnosis Date Noted   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 male with PMH: HTN, CAD, HLD, osteoarthritis, SCC, sleep apnea. He presented to the hospital on 05/20/22 with right sided weakness and aphasia with LKW reported by wife to be 10pm on 10/2.  He  arrived at [facility] as code stroke and taken for emergent CT/CTA which demonstrated left M1 occlusion. He received IV tenecteplase and IR thrombectomy due to large vessel occlusion.       DIAGNOSTIC FINDINGS:  MRI 05/22/2022 Cortical ischemia throughout the left MCA territory compatible with recent infarct. Relatively little affect on the white matter. 2. Small focus of acute hemorrhage along the anterior surface of the left temporal lobe Heidelberg classification 3c: Subarachnoid hemorrhage. 3. Normal intracranial MRA.  Restored flow in left MCA.    THERAPY DIAG:  Aphasia  Apraxia  Middle cerebral artery embolism, left  Rationale for  Evaluation and Treatment Rehabilitation  SUBJECTIVE:   PAIN:  Are you having pain? No  PATIENT GOALS: to be able to communicate again  SUBJECTIVE STATEMENT: Pt with increased verbal communication about upcoming out of town trip Pt accompanied by: self and significant other  OBJECTIVE:   TODAY'S TREATMENT: Skilled treatment session focused on pt's communication goals. SLP facilitated session by providing the following interventions:  When engaging pt in conversation about upcoming out of trip to visit his granddaughter, he was more conversant and referred to his Text to Speech app for several utterances. His wife commented while pointing to app - "that thing has been a life saver."  SLP also provided reduced amount of assistance for correct utterances regarding numbers - Min A provided to achieve 90% accuracy.    PATIENT EDUCATION: Education details: see above Person educated: Patient and Wife Education method: Explanation, Facilities manager, Verbal cues, and Handouts Education comprehension: verbalized understanding  HOME EXERCISE PROGRAM: Practice functional phrases contained in Text to Speech App Word lists  GOALS: Goals reviewed with patient? Yes  SHORT TERM GOALS: Target date: 10 sessions   UPDATED: 06/13/2023 3.   Pt will improve wording finding abilities by writing 3 or more words during communication exchanges to increase listener understanding with moderate A in 5 out of 7 opportunities; ONGOING;   05/19/2023 - PROGRESS MADE - currently writing down 2-3 words with - progress made moderate assistance in 3 of 7 opportunities 5. Pt will improve spelling ability by accurately spelling SEMI-COMPLEX words during communication exchanges to increase listener understanding with min A in 5 out of 7 opportunities; ONGOING  05/19/2023 - PROGRESS MADE - currently working at [employer] A level in 3 out of 7 opportunities - progress made 6.  Pt will read self-generated words during  communication exchanges with > 50% speech intelligibility given Min A articulatory cues; ONGOING - progress made  05/19/2023 - MET - upgraded to Pt will read self-generated words during communication exchanges with > 75% speech intelligibility given Min A articulatory cues - progress made  LONG TERM GOALS: Target date: 09/04/2022     Pt will use multimodal communication to express basic wants/needs in 4 of 10 opportunities.  Baseline: unable Goal status: ONGOING; MET when using combination of verbal, paper and pencil and Text to Speech App 05/19/2023 - Upgraded to Pt will use multimodal communication to express basic wants/needs in 7 out of 10 opportunities. - progress made  2.  Pt will initiate communication in 3 of 5 opportunities as reported by caregiver outside of ST sessions.   Baseline: no initiation  Goal Status: Initial: ONGOING; his wife reports mild increase in attempts - progress made    ASSESSMENT:  CLINICAL IMPRESSION: Pt is a 77 year old male who was seen today for therapy targeting his aphasia. Pt with improved communication using text to speech app.  Pt continues to  demonstrate improved speech intelligibility and use of accurate motor speech patterns. During today's session and during functional activities, pt is using increased multimodal means of communication. Recommend he use when visiting family this weekend. See the above detailed treatment note.    OBJECTIVE IMPAIRMENTS include expressive language, receptive language, aphasia, and apraxia. These impairments are limiting patient from managing medications, managing appointments, managing finances, household responsibilities, ADLs/IADLs, and effectively communicating at home and in community. Factors affecting potential to achieve goals and functional outcome are severity of impairments. Patient will benefit from skilled SLP services to address above impairments and improve overall function.  REHAB POTENTIAL:  Excellent  PLAN: SLP FREQUENCY: 3x/week  SLP DURATION: 12 weeks  PLANNED INTERVENTIONS: Language facilitation, Environmental controls, Internal/external aids, Functional tasks, Multimodal communication approach, SLP instruction and feedback, Compensatory strategies, and Patient/family education    Daquavion Catala B. Dreama Saa, M.S., CCC-SLP, Tree surgeon Certified Brain Injury Specialist [facility]  [facility] Office [phone] Ascom [phone] Fax [phone]

## 2023-06-30 ENCOUNTER — Ambulatory Visit: Payer: Medicare HMO | Admitting: Speech Pathology

## 2023-07-02 ENCOUNTER — Ambulatory Visit: Payer: Medicare HMO | Admitting: Speech Pathology

## 2023-07-02 DIAGNOSIS — R482 Apraxia: Secondary | ICD-10-CM | POA: Diagnosis not present

## 2023-07-02 DIAGNOSIS — I6602 Occlusion and stenosis of left middle cerebral artery: Secondary | ICD-10-CM | POA: Diagnosis not present

## 2023-07-02 DIAGNOSIS — R4701 Aphasia: Secondary | ICD-10-CM | POA: Diagnosis not present

## 2023-07-02 NOTE — Therapy (Signed)
OUTPATIENT SPEECH LANGUAGE PATHOLOGY TREATMENT NOTE   Patient Name: ALOYS Stewart MRN: [id number] DOB:14-Jan-1946, 77 y.o., male 78 Date: 07/02/2023  PCP: Daniel Nones, MD REFERRING PROVIDER: Daniel Nones, MD    END OF SESSION  End of Session - 07/02/23 1105     Visit Number 136    Number of Visits 165    Date for SLP Re-Evaluation 09/05/23    Authorization Type [employer] H[employer]    Authorization Time Period 03/21/2023 thru 07/04/2023    Authorization - Visit Number 34    Authorization - Number of Visits 40    Progress Note Due on Visit 140    SLP Start Time 1015    SLP Stop Time  1100    SLP Time Calculation (min) 45 min    Activity Tolerance Patient tolerated treatment well                   Past Medical History:  Diagnosis Date   CAD (coronary artery disease)    a. 10/2021 NSTEMI/PCI: LM nl, LAD min irregs, LCX 50m (2.5x18 Onyx Frontier DES), OM1 nl, RCA 45m (3.5x26 Onyx Frontier DES), 30d.   Diastolic dysfunction    a. 10/2021 Echo: EF 50-55%, no rwma, Gr1 DD, nl RV fxn. No significant valvular dzs.   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 SCCis. MOHs 07/08/22   Past Surgical History:  Procedure Laterality Date   COLONOSCOPY N/A 10/09/2015   Procedure: COLONOSCOPY;  Surgeon: Wallace Cullens, MD;  Location: [facility] ENDOSCOPY;  Service: Gastroenterology;  Laterality: N/A;   CORONARY STENT INTERVENTION N/A 10/22/2021   Procedure: CORONARY STENT INTERVENTION;  Surgeon: Iran Ouch, MD;  Location: [facility] INVASIVE CV LAB;  Service: Cardiovascular;  Laterality: N/A;   IR CT HEAD LTD  05/21/2022   IR PERCUTANEOUS ART THROMBECTOMY/INFUSION INTRACRANIAL INC DIAG ANGIO  05/21/2022   LEFT HEART CATH AND CORONARY ANGIOGRAPHY N/A 10/22/2021   Procedure: LEFT HEART  CATH AND CORONARY ANGIOGRAPHY;  Surgeon: Iran Ouch, MD;  Location: [facility] INVASIVE CV LAB;  Service: Cardiovascular;  Laterality: N/A;   LOOP RECORDER INSERTION N/A 05/23/2022   Procedure: LOOP RECORDER INSERTION;  Surgeon: Lanier Prude, MD;  Location: [facility] INVASIVE CV LAB;  Service: Cardiovascular;  Laterality: N/A;   RADIOLOGY WITH ANESTHESIA N/A 05/20/2022   Procedure: IR WITH ANESTHESIA;  Surgeon: Julieanne Cotton, MD;  Location: [facility] OR;  Service: Radiology;  Laterality: N/A;   ROTATOR CUFF REPAIR     Patient Active Problem List   Diagnosis Date Noted   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 male with PMH: HTN, CAD, HLD, osteoarthritis, SCC, sleep apnea. He presented to the hospital on 05/20/22 with right sided weakness and aphasia with LKW reported by wife to be 10pm on 10/2.  He  arrived at [facility] as code stroke and taken for emergent CT/CTA which demonstrated left M1 occlusion. He received IV tenecteplase and IR thrombectomy due to large vessel occlusion.       DIAGNOSTIC FINDINGS:  MRI 05/22/2022 Cortical ischemia throughout the left MCA territory compatible with recent infarct. Relatively little affect on the white matter. 2. Small focus of acute hemorrhage along the anterior surface of the left temporal lobe Heidelberg classification 3c: Subarachnoid hemorrhage. 3. Normal intracranial MRA.  Restored flow in left MCA.    THERAPY DIAG:  Aphasia  Apraxia  Middle cerebral artery embolism, left  Rationale for  Evaluation and Treatment Rehabilitation  SUBJECTIVE:   PAIN:  Are you having pain? No  PATIENT GOALS: to be able to communicate again  SUBJECTIVE STATEMENT: Pt missed Monday's session as they were out of town for granddaughter's birthday Pt accompanied by: self and significant other  OBJECTIVE:   TODAY'S TREATMENT: Skilled treatment session focused on pt's communication goals. SLP facilitated session by providing the following interventions:  Pt independently using Text to Speech App to communicate during conversation about taking [employer] - pt indicated that he had taken a "taxi" before  Talk[employer] Therapy App utilized to promote accurate motor speech patterns Functional Repetition Phrase Level 4: 100& Functional Repetition Phrase Level 5: 10/20 improving to 18 out of 20 with Minimal verbal repetition with SLP   PATIENT EDUCATION: Education details: see above Person educated: Patient and Wife Education method: Programmer, multimedia, Facilities manager, Verbal cues, and Handouts Education comprehension: verbalized understanding  HOME EXERCISE PROGRAM: Practice functional phrases contained in Text to Speech App Word lists  GOALS: Goals reviewed with patient? Yes  SHORT TERM GOALS: Target date: 10 sessions   UPDATED: 06/13/2023 3.   Pt will improve wording finding abilities by writing 3 or more words during communication exchanges to increase listener understanding with moderate A in 5 out of 7 opportunities; ONGOING;   05/19/2023 - PROGRESS MADE - currently writing down 2-3 words with - progress made moderate assistance in 3 of 7 opportunities 5. Pt will improve spelling ability by accurately spelling SEMI-COMPLEX words during communication exchanges to increase listener understanding with min A in 5 out of 7 opportunities; ONGOING  05/19/2023 - PROGRESS MADE - currently working at [employer] A level in 3 out of 7 opportunities - progress made 6.  Pt will read self-generated words during  communication exchanges with > 50% speech intelligibility given Min A articulatory cues; ONGOING - progress made  05/19/2023 - MET - upgraded to Pt will read self-generated words during communication exchanges with > 75% speech intelligibility given Min A articulatory cues - progress made  LONG TERM GOALS: Target date: 09/04/2022     Pt will use multimodal communication to express basic wants/needs in 4 of 10 opportunities.  Baseline: unable Goal status: ONGOING; MET when using combination of verbal, paper and pencil and Text to Speech App 05/19/2023 - Upgraded to Pt will use multimodal communication to express basic wants/needs in 7 out of 10 opportunities. - progress made  2.  Pt will initiate communication in 3 of 5 opportunities as reported by caregiver outside of ST sessions.   Baseline: no initiation  Goal Status: Initial: ONGOING; his wife reports mild increase in attempts - progress made    ASSESSMENT:  CLINICAL IMPRESSION: Pt is a 77 year old male who was seen today for therapy targeting his aphasia. Pt with improved communication using text to speech app.  Pt continues to demonstrate improved speech intelligibility and  use of accurate motor speech patterns. During today's session and during functional activities, pt with independent initiation of multimodal means of communication. See the above detailed treatment note.    OBJECTIVE IMPAIRMENTS include expressive language, receptive language, aphasia, and apraxia. These impairments are limiting patient from managing medications, managing appointments, managing finances, household responsibilities, ADLs/IADLs, and effectively communicating at home and in community. Factors affecting potential to achieve goals and functional outcome are severity of impairments. Patient will benefit from skilled SLP services to address above impairments and improve overall function.  REHAB POTENTIAL: Excellent  PLAN: SLP FREQUENCY: 3x/week  SLP  DURATION: 12 weeks  PLANNED INTERVENTIONS: Language facilitation, Environmental controls, Internal/external aids, Functional tasks, Multimodal communication approach, SLP instruction and feedback, Compensatory strategies, and Patient/family education    Tevon Berhane B. Dreama Saa, M.S., CCC-SLP, Tree surgeon Certified Brain Injury Specialist [facility]  [facility] Services Office [phone] Ascom [phone] Fax [phone]

## 2023-07-03 ENCOUNTER — Ambulatory Visit (INDEPENDENT_AMBULATORY_CARE_PROVIDER_SITE_OTHER): Payer: Medicare HMO

## 2023-07-03 DIAGNOSIS — I63412 Cerebral infarction due to embolism of left middle cerebral artery: Secondary | ICD-10-CM | POA: Diagnosis not present

## 2023-07-04 ENCOUNTER — Ambulatory Visit: Payer: Medicare HMO | Admitting: Speech Pathology

## 2023-07-04 DIAGNOSIS — R4701 Aphasia: Secondary | ICD-10-CM | POA: Diagnosis not present

## 2023-07-04 DIAGNOSIS — I6602 Occlusion and stenosis of left middle cerebral artery: Secondary | ICD-10-CM | POA: Diagnosis not present

## 2023-07-04 DIAGNOSIS — R482 Apraxia: Secondary | ICD-10-CM

## 2023-07-04 NOTE — Therapy (Signed)
OUTPATIENT SPEECH LANGUAGE PATHOLOGY TREATMENT NOTE   Patient Name: Alan Stewart MRN: [id number] DOB:Feb 10, 1946, 77 y.o., male 39 Date: 07/04/2023  PCP: Daniel Nones, MD REFERRING PROVIDER: Daniel Nones, MD    END OF SESSION  End of Session - 07/04/23 1135     Visit Number 137    Number of Visits 165    Date for SLP Re-Evaluation 09/05/23    Authorization Type [employer] H[employer]    Authorization Time Period 03/21/2023 thru 07/04/2023    Authorization - Visit Number [id number]    Authorization - Number of Visits 40    Progress Note Due on Visit 140    SLP Start Time 1015    SLP Stop Time  1100    SLP Time Calculation (min) 45 min    Activity Tolerance Patient tolerated treatment well                   Past Medical History:  Diagnosis Date   CAD (coronary artery disease)    a. 10/2021 NSTEMI/PCI: LM nl, LAD min irregs, LCX 36m (2.5x18 Onyx Frontier DES), OM1 nl, RCA 71m (3.5x26 Onyx Frontier DES), 30d.   Diastolic dysfunction    a. 10/2021 Echo: EF 50-55%, no rwma, Gr1 DD, nl RV fxn. No significant valvular dzs.   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 SCCis. MOHs 07/08/22   Past Surgical History:  Procedure Laterality Date   COLONOSCOPY N/A 10/09/2015   Procedure: COLONOSCOPY;  Surgeon: Wallace Cullens, MD;  Location: [facility] ENDOSCOPY;  Service: Gastroenterology;  Laterality: N/A;   CORONARY STENT INTERVENTION N/A 10/22/2021   Procedure: CORONARY STENT INTERVENTION;  Surgeon: Iran Ouch, MD;  Location: [facility] INVASIVE CV LAB;  Service: Cardiovascular;  Laterality: N/A;   IR CT HEAD LTD  05/21/2022   IR PERCUTANEOUS ART THROMBECTOMY/INFUSION INTRACRANIAL INC DIAG ANGIO  05/21/2022   LEFT HEART CATH AND CORONARY ANGIOGRAPHY N/A 10/22/2021   Procedure: LEFT HEART  CATH AND CORONARY ANGIOGRAPHY;  Surgeon: Iran Ouch, MD;  Location: [facility] INVASIVE CV LAB;  Service: Cardiovascular;  Laterality: N/A;   LOOP RECORDER INSERTION N/A 05/23/2022   Procedure: LOOP RECORDER INSERTION;  Surgeon: Lanier Prude, MD;  Location: [facility] INVASIVE CV LAB;  Service: Cardiovascular;  Laterality: N/A;   RADIOLOGY WITH ANESTHESIA N/A 05/20/2022   Procedure: IR WITH ANESTHESIA;  Surgeon: Julieanne Cotton, MD;  Location: [facility] OR;  Service: Radiology;  Laterality: N/A;   ROTATOR CUFF REPAIR     Patient Active Problem List   Diagnosis Date Noted   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 male with PMH: HTN, CAD, HLD, osteoarthritis, SCC, sleep apnea. He presented to the hospital on 05/20/22 with right sided weakness and aphasia with LKW reported by wife to be 10pm on 10/2.  He  arrived at [facility] as code stroke and taken for emergent CT/CTA which demonstrated left M1 occlusion. He received IV tenecteplase and IR thrombectomy due to large vessel occlusion.       DIAGNOSTIC FINDINGS:  MRI 05/22/2022 Cortical ischemia throughout the left MCA territory compatible with recent infarct. Relatively little affect on the white matter. 2. Small focus of acute hemorrhage along the anterior surface of the left temporal lobe Heidelberg classification 3c: Subarachnoid hemorrhage. 3. Normal intracranial MRA.  Restored flow in left MCA.    THERAPY DIAG:  Aphasia  Apraxia  Middle cerebral artery embolism, left  Rationale for  Evaluation and Treatment Rehabilitation  SUBJECTIVE:   PAIN:  Are you having pain? No  PATIENT GOALS: to be able to communicate again  SUBJECTIVE STATEMENT: "That has really helped" referring to the Text to Speech App Pt accompanied by: self and significant other  OBJECTIVE:   TODAY'S TREATMENT: Skilled treatment session focused on pt's communication goals. SLP facilitated session by providing the following interventions:  Pt independently using Text to Speech App to communicate during conversation about taking golf  Counting facilitated thru novel card game - pt required moderate assistance to increase self-awareness of errors    PATIENT EDUCATION: Education details: see above Person educated: Patient and Wife Education method: Explanation, Demonstration, Verbal cues, and Handouts Education comprehension: verbalized understanding  HOME EXERCISE PROGRAM: Practice functional phrases contained in Text to Speech App Word lists  GOALS: Goals reviewed with patient? Yes  SHORT TERM GOALS: Target date: 10 sessions   UPDATED: 06/13/2023 3.   Pt will improve wording finding abilities by writing 3 or more words during communication exchanges to increase listener understanding with moderate A in 5 out of 7 opportunities; ONGOING;   05/19/2023 - PROGRESS MADE - currently writing down 2-3 words with - progress made moderate assistance in 3 of 7 opportunities 5. Pt will improve spelling ability by accurately spelling SEMI-COMPLEX words during communication exchanges to increase listener understanding with min A in 5 out of 7 opportunities; ONGOING  05/19/2023 - PROGRESS MADE - currently working at [employer] A level in 3 out of 7 opportunities - progress made 6.  Pt will read self-generated words during communication exchanges with > 50% speech intelligibility given Min A articulatory cues; ONGOING - progress made  05/19/2023 - MET - upgraded to Pt will read self-generated words during  communication exchanges with > 75% speech intelligibility given Min A articulatory cues - progress made  LONG TERM GOALS: Target date: 09/04/2022     Pt will use multimodal communication to express basic wants/needs in 4 of 10 opportunities.  Baseline: unable Goal status: ONGOING; MET when using combination of verbal, paper and pencil and Text to Speech App 05/19/2023 - Upgraded to Pt will use multimodal communication to express basic wants/needs in 7 out of 10 opportunities. - progress made  2.  Pt will initiate communication in 3 of 5 opportunities as reported by caregiver outside of ST sessions.   Baseline: no initiation  Goal Status: Initial: ONGOING; his wife reports mild increase in attempts - progress made    ASSESSMENT:  CLINICAL IMPRESSION: Pt is a 77 year old male who was seen today for therapy targeting his aphasia. Pt with improved communication using text to speech app.  Pt continues to demonstrate improved speech intelligibility and use of accurate motor speech patterns. During today's session and during functional activities, pt with independent initiation of multimodal means of communication. See the above detailed treatment note.  OBJECTIVE IMPAIRMENTS include expressive language, receptive language, aphasia, and apraxia. These impairments are limiting patient from managing medications, managing appointments, managing finances, household responsibilities, ADLs/IADLs, and effectively communicating at home and in community. Factors affecting potential to achieve goals and functional outcome are severity of impairments. Patient will benefit from skilled SLP services to address above impairments and improve overall function.  REHAB POTENTIAL: Excellent  PLAN: SLP FREQUENCY: 3x/week  SLP DURATION: 12 weeks  PLANNED INTERVENTIONS: Language facilitation, Environmental controls, Internal/external aids, Functional tasks, Multimodal communication approach, SLP instruction and  feedback, Compensatory strategies, and Patient/family education    Lilianna Case B. Dreama Saa, M.S., CCC-SLP, Tree surgeon Certified Brain Injury Specialist [facility]  [facility] Rehabilitation Services Office [phone] Ascom [phone] Fax [phone]

## 2023-07-04 NOTE — Telephone Encounter (Signed)
Scheduled 09/23/23

## 2023-07-05 LAB — CUP PACEART REMOTE DEVICE CHECK
Date Time Interrogation Session: 20241114075415
Implantable Pulse Generator Implant Date: 20231005
Pulse Gen Model: 450218
Pulse Gen Serial Number: 95054077

## 2023-07-07 ENCOUNTER — Ambulatory Visit: Payer: Medicare HMO | Admitting: Speech Pathology

## 2023-07-09 ENCOUNTER — Ambulatory Visit: Payer: Medicare HMO | Admitting: Speech Pathology

## 2023-07-09 DIAGNOSIS — R4701 Aphasia: Secondary | ICD-10-CM

## 2023-07-09 DIAGNOSIS — I6602 Occlusion and stenosis of left middle cerebral artery: Secondary | ICD-10-CM | POA: Diagnosis not present

## 2023-07-09 DIAGNOSIS — R482 Apraxia: Secondary | ICD-10-CM

## 2023-07-09 NOTE — Therapy (Signed)
OUTPATIENT SPEECH LANGUAGE PATHOLOGY TREATMENT NOTE   Patient Name: Alan Stewart MRN: [id number] DOB:[birth year], 77 y.o., male 22 Date: 07/09/2023  PCP: Daniel Nones, MD REFERRING PROVIDER: Daniel Nones, MD    END OF SESSION  End of Session - 07/09/23 1222     Visit Number 138    Number of Visits 165    Date for SLP Re-Evaluation 09/05/23    Authorization Type [employer] H[employer]    Authorization Time Period 03/21/2023 thru 08/22/2023    Authorization - Visit Number 36    Authorization - Number of Visits 60    Progress Note Due on Visit 140    SLP Start Time 1015    SLP Stop Time  1100    SLP Time Calculation (min) 45 min    Activity Tolerance Patient tolerated treatment well                   Past Medical History:  Diagnosis Date   CAD (coronary artery disease)    a. 10/2021 NSTEMI/PCI: LM nl, LAD min irregs, LCX 46m (2.5x18 Onyx Frontier DES), OM1 nl, RCA 19m (3.5x26 Onyx Frontier DES), 30d.   Diastolic dysfunction    a. 10/2021 Echo: EF 50-55%, no rwma, Gr1 DD, nl RV fxn. No significant valvular dzs.   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 SCCis. MOHs 07/08/22   Past Surgical History:  Procedure Laterality Date   COLONOSCOPY N/A 10/09/2015   Procedure: COLONOSCOPY;  Surgeon: Wallace Cullens, MD;  Location[facility] ENDOSCOPY;  Service: Gastroenterology;  Laterality: N/A;   CORONARY STENT INTERVENTION N/A 10/22/2021   Procedure: CORONARY STENT INTERVENTION;  Surgeon: Iran Ouch, MD;  Location: [facility] INVASIVE CV LAB;  Service: Cardiovascular;  Laterality: N/A;   IR CT HEAD LTD  05/21/2022   IR PERCUTANEOUS ART THROMBECTOMY/INFUSION INTRACRANIAL INC DIAG ANGIO  05/21/2022   LEFT HEART CATH AND CORONARY ANGIOGRAPHY N/A 10/22/2021   Procedure: LEFT HEART  CATH AND CORONARY ANGIOGRAPHY;  Surgeon: Iran Ouch, MD;  Location: [facility] INVASIVE CV LAB;  Service: Cardiovascular;  Laterality: N/A;   LOOP RECORDER INSERTION N/A 05/23/2022   Procedure: LOOP RECORDER INSERTION;  Surgeon: Lanier Prude, MD;  Location: [facility] INVASIVE CV LAB;  Service: Cardiovascular;  Laterality: N/A;   RADIOLOGY WITH ANESTHESIA N/A 05/20/2022   Procedure: IR WITH ANESTHESIA;  Surgeon: Julieanne Cotton, MD;  Location: [facility] OR;  Service: Radiology;  Laterality: N/A;   ROTATOR CUFF REPAIR     Patient Active Problem List   Diagnosis Date Noted   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 male with PMH: HTN, CAD, HLD, osteoarthritis, SCC, sleep apnea. He presented to the hospital on 05/20/22 with right sided weakness and aphasia with LKW reported by wife to be 10pm on 10/2.  He  arrived at [facility] as code stroke and taken for emergent CT/CTA which demonstrated left M1 occlusion. He received IV tenecteplase and IR thrombectomy due to large vessel occlusion.       DIAGNOSTIC FINDINGS:  MRI 05/22/2022 Cortical ischemia throughout the left MCA territory compatible with recent infarct. Relatively little affect on the white matter. 2. Small focus of acute hemorrhage along the anterior surface of the left temporal lobe Heidelberg classification 3c: Subarachnoid hemorrhage. 3. Normal intracranial MRA.  Restored flow in left MCA.    THERAPY DIAG:  Aphasia  Apraxia  Middle cerebral artery embolism, left  Rationale for  Evaluation and Treatment Rehabilitation  SUBJECTIVE:   PAIN:  Are you having pain? No  PATIENT GOALS: to be able to communicate again  SUBJECTIVE STATEMENT: Pt used Text to Speech app to communicate info about Thanksgiving Pt accompanied by: self and significant other  OBJECTIVE:   TODAY'S TREATMENT: Skilled treatment session focused on pt's communication goals. SLP facilitated session by providing the following interventions:  Pt independently using Text to Speech App to communicate during conversation about recent NFL games and standings  Counting facilitated thru novel card game - pt required minimal assistance to increase self-awareness of errors    PATIENT EDUCATION: Education details: see above Person educated: Patient and Wife Education method: Explanation, Demonstration, Verbal cues, and Handouts Education comprehension: verbalized understanding  HOME EXERCISE PROGRAM: Practice functional phrases contained in Text to Speech App Word lists  GOALS: Goals reviewed with patient? Yes  SHORT TERM GOALS: Target date: 10 sessions   UPDATED: 06/13/2023 3.   Pt will improve wording finding abilities by writing 3 or more words during communication exchanges to increase listener understanding with moderate A in 5 out of 7 opportunities; ONGOING;   05/19/2023 - PROGRESS MADE - currently writing down 2-3 words with - progress made moderate assistance in 3 of 7 opportunities 5. Pt will improve spelling ability by accurately spelling SEMI-COMPLEX words during communication exchanges to increase listener understanding with min A in 5 out of 7 opportunities; ONGOING  05/19/2023 - PROGRESS MADE - currently working at [employer] A level in 3 out of 7 opportunities - progress made 6.  Pt will read self-generated words during communication exchanges with > 50% speech intelligibility given Min A articulatory cues; ONGOING - progress made  05/19/2023 - MET - upgraded to Pt will read  self-generated words during communication exchanges with > 75% speech intelligibility given Min A articulatory cues - progress made  LONG TERM GOALS: Target date: 09/04/2022     Pt will use multimodal communication to express basic wants/needs in 4 of 10 opportunities.  Baseline: unable Goal status: ONGOING; MET when using combination of verbal, paper and pencil and Text to Speech App 05/19/2023 - Upgraded to Pt will use multimodal communication to express basic wants/needs in 7 out of 10 opportunities. - progress made  2.  Pt will initiate communication in 3 of 5 opportunities as reported by caregiver outside of ST sessions.   Baseline: no initiation  Goal Status: Initial: ONGOING; his wife reports mild increase in attempts - progress made    ASSESSMENT:  CLINICAL IMPRESSION: Pt is a 77 year old male who was seen today for therapy targeting his aphasia. Pt with improved communication using text to speech app.  Pt continues to demonstrate improved speech intelligibility and use of accurate motor speech patterns. During today's session and during functional activities, pt with independent initiation of multimodal means of communication. See the above detailed  treatment note.    OBJECTIVE IMPAIRMENTS include expressive language, receptive language, aphasia, and apraxia. These impairments are limiting patient from managing medications, managing appointments, managing finances, household responsibilities, ADLs/IADLs, and effectively communicating at home and in community. Factors affecting potential to achieve goals and functional outcome are severity of impairments. Patient will benefit from skilled SLP services to address above impairments and improve overall function.  REHAB POTENTIAL: Excellent  PLAN: SLP FREQUENCY: 3x/week  SLP DURATION: 12 weeks  PLANNED INTERVENTIONS: Language facilitation, Environmental controls, Internal/external aids, Functional tasks, Multimodal communication  approach, SLP instruction and feedback, Compensatory strategies, and Patient/family education    Jaqwan Wieber B. Dreama Saa, M.S., CCC-SLP, Tree surgeon Certified Brain Injury Specialist [facility]  [facility] Rehabilitation Services Office [phone] Ascom [phone] Fax [phone]

## 2023-07-11 ENCOUNTER — Ambulatory Visit: Payer: Medicare HMO | Admitting: Speech Pathology

## 2023-07-14 ENCOUNTER — Ambulatory Visit: Payer: Medicare HMO | Admitting: Speech Pathology

## 2023-07-14 DIAGNOSIS — R482 Apraxia: Secondary | ICD-10-CM

## 2023-07-14 DIAGNOSIS — I6602 Occlusion and stenosis of left middle cerebral artery: Secondary | ICD-10-CM | POA: Diagnosis not present

## 2023-07-14 DIAGNOSIS — R4701 Aphasia: Secondary | ICD-10-CM

## 2023-07-14 NOTE — Therapy (Signed)
OUTPATIENT SPEECH LANGUAGE PATHOLOGY TREATMENT NOTE   Patient Name: Alan Stewart MRN: [id number] DOB:1946-07-01, 77 y.o., male 42 Date: 07/14/2023  PCP: Daniel Nones, MD REFERRING PROVIDER: Daniel Nones, MD    END OF SESSION  End of Session - 07/14/23 1039     Visit Number 139    Number of Visits 165    Date for SLP Re-Evaluation 09/05/23    Authorization Type [employer] H[employer]    Authorization Time Period 03/21/2023 thru 08/22/2023    Authorization - Visit Number [id number]    Authorization - Number of Visits 60    Progress Note Due on Visit 140    SLP Start Time 1015    SLP Stop Time  1100    SLP Time Calculation (min) 45 min    Activity Tolerance Patient tolerated treatment well                   Past Medical History:  Diagnosis Date   CAD (coronary artery disease)    a. 10/2021 NSTEMI/PCI: LM nl, LAD min irregs, LCX 29m (2.5x18 Onyx Frontier DES), OM1 nl, RCA 39m (3.5x26 Onyx Frontier DES), 30d.   Diastolic dysfunction    a. 10/2021 Echo: EF 50-55%, no rwma, Gr1 DD, nl RV fxn. No significant valvular dzs.   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 SCCis. MOHs 07/08/22   Past Surgical History:  Procedure Laterality Date   COLONOSCOPY N/A 10/09/2015   Procedure: COLONOSCOPY;  Surgeon: Wallace Cullens, MD;  Location: [facility] ENDOSCOPY;  Service: Gastroenterology;  Laterality: N/A;   CORONARY STENT INTERVENTION N/A 10/22/2021   Procedure: CORONARY STENT INTERVENTION;  Surgeon: Iran Ouch, MD;  Location: [facility] INVASIVE CV LAB;  Service: Cardiovascular;  Laterality: N/A;   IR CT HEAD LTD  05/21/2022   IR PERCUTANEOUS ART THROMBECTOMY/INFUSION INTRACRANIAL INC DIAG ANGIO  05/21/2022   LEFT HEART CATH AND CORONARY ANGIOGRAPHY N/A 10/22/2021   Procedure: LEFT HEART  CATH AND CORONARY ANGIOGRAPHY;  Surgeon: Iran Ouch, MD;  Location: [facility] INVASIVE CV LAB;  Service: Cardiovascular;  Laterality: N/A;   LOOP RECORDER INSERTION N/A 05/23/2022   Procedure: LOOP RECORDER INSERTION;  Surgeon: Lanier Prude, MD;  Location: [facility] INVASIVE CV LAB;  Service: Cardiovascular;  Laterality: N/A;   RADIOLOGY WITH ANESTHESIA N/A 05/20/2022   Procedure: IR WITH ANESTHESIA;  Surgeon: Julieanne Cotton, MD;  Location: [facility] OR;  Service: Radiology;  Laterality: N/A;   ROTATOR CUFF REPAIR     Patient Active Problem List   Diagnosis Date Noted   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 male with PMH: HTN, CAD, HLD, osteoarthritis, SCC, sleep apnea. He presented to the hospital on 05/20/22 with right sided weakness and aphasia with LKW reported by wife to be 10pm on 10/2.  He  arrived at [facility] as code stroke and taken for emergent CT/CTA which demonstrated left M1 occlusion. He received IV tenecteplase and IR thrombectomy due to large vessel occlusion.       DIAGNOSTIC FINDINGS:  MRI 05/22/2022 Cortical ischemia throughout the left MCA territory compatible with recent infarct. Relatively little affect on the white matter. 2. Small focus of acute hemorrhage along the anterior surface of the left temporal lobe Heidelberg classification 3c: Subarachnoid hemorrhage. 3. Normal intracranial MRA.  Restored flow in left MCA.    THERAPY DIAG:  Aphasia  Apraxia  Middle cerebral artery embolism, left  Rationale for  Evaluation and Treatment Rehabilitation  SUBJECTIVE:   PAIN:  Are you having pain? No  PATIENT GOALS: to be able to communicate again  SUBJECTIVE STATEMENT: Pt and wife arrived talking about recent miscommunication regarding ham  Pt accompanied by: self and significant other  OBJECTIVE:   TODAY'S TREATMENT: Skilled treatment session focused on pt's communication goals. SLP facilitated session by providing the following interventions:  With maximal multimodal assistance, pt able to communicate that he wanted ham "LUNCH MEAT" for sandwiches which she had already bought for pt - with SLP written assistance pt able to understanding that he was saying "we have some, we need some' which was contradictory.   Pt continues to want to target numbers - with minimal A pt able to look at cards and count each with good awareness of numbers that he was stating the correct   PATIENT EDUCATION: Education details: see above Person educated: Patient and Wife Education method: Explanation, Demonstration, Verbal cues, and Handouts Education comprehension: verbalized understanding  HOME EXERCISE PROGRAM: Practice functional phrases contained in Text to Speech App Word lists  GOALS: Goals reviewed with patient? Yes  SHORT TERM GOALS: Target date: 10 sessions   UPDATED: 06/13/2023 3.   Pt will improve wording finding abilities by writing 3 or more words during communication exchanges to increase listener understanding with moderate A in 5 out of 7 opportunities; ONGOING;   05/19/2023 - PROGRESS MADE - currently writing down 2-3 words with - progress made moderate assistance in 3 of 7 opportunities 5. Pt will improve spelling ability by accurately spelling SEMI-COMPLEX words during communication exchanges to increase listener understanding with min A in 5 out of 7 opportunities; ONGOING  05/19/2023 - PROGRESS MADE - currently working at [employer] A level in 3 out of 7 opportunities - progress made 6.  Pt  will read self-generated words during communication exchanges with > 50% speech intelligibility given Min A articulatory cues; ONGOING - progress made  05/19/2023 - MET - upgraded to Pt will read self-generated words during communication exchanges with > 75% speech intelligibility given Min A articulatory cues - progress made  LONG TERM GOALS: Target date: 09/04/2022     Pt will use multimodal communication to express basic wants/needs in 4 of 10 opportunities.  Baseline: unable Goal status: ONGOING; MET when using combination of verbal, paper and pencil and Text to Speech App 05/19/2023 - Upgraded to Pt will use multimodal communication to express basic wants/needs in 7 out of 10 opportunities. - progress made  2.  Pt will initiate communication in 3 of 5 opportunities as reported by caregiver outside of ST sessions.   Baseline: no initiation  Goal Status: Initial: ONGOING; his wife reports mild increase in attempts - progress made    ASSESSMENT:  CLINICAL IMPRESSION: Pt is a 77 year old male who was seen today for therapy targeting his aphasia. Pt  with improved communication using text to speech app.  Pt continues to demonstrate improved speech intelligibility and use of accurate motor speech patterns. During today's session and during functional activities, pt with independent initiation of multimodal means of communication.   Pt very frustrated with tendency to to continue looking at his wife and saying "you should know" during today's session when he was not able to communication his wants/needs. See the above detailed treatment note.    OBJECTIVE IMPAIRMENTS include expressive language, receptive language, aphasia, and apraxia. These impairments are limiting patient from managing medications, managing appointments, managing finances, household responsibilities, ADLs/IADLs, and effectively communicating at home and in community. Factors affecting potential to achieve goals and functional  outcome are severity of impairments. Patient will benefit from skilled SLP services to address above impairments and improve overall function.   REHAB POTENTIAL: Excellent  PLAN: SLP FREQUENCY: 3x/week  SLP DURATION: 12 weeks  PLANNED INTERVENTIONS: Language facilitation, Environmental controls, Internal/external aids, Functional tasks, Multimodal communication approach, SLP instruction and feedback, Compensatory strategies, and Patient/family education    Zimri Brennen B. Dreama Saa, M.S., CCC-SLP, Tree surgeon Certified Brain Injury Specialist [facility]  [facility] Rehabilitation Services Office [phone] Ascom [phone] Fax [phone]

## 2023-07-16 ENCOUNTER — Ambulatory Visit: Payer: Medicare HMO | Admitting: Speech Pathology

## 2023-07-21 ENCOUNTER — Ambulatory Visit: Payer: Medicare HMO | Attending: Internal Medicine | Admitting: Speech Pathology

## 2023-07-21 DIAGNOSIS — R482 Apraxia: Secondary | ICD-10-CM | POA: Diagnosis not present

## 2023-07-21 DIAGNOSIS — R4701 Aphasia: Secondary | ICD-10-CM

## 2023-07-21 DIAGNOSIS — I6602 Occlusion and stenosis of left middle cerebral artery: Secondary | ICD-10-CM | POA: Diagnosis not present

## 2023-07-21 NOTE — Therapy (Signed)
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 Patient is making progress toward LTGs and STGs this reporting period. See skilled intervention, clinical impressions, and goals below for details.   END OF SESSION  End of Session - 07/21/23 1024     Visit Number 140    Number of Visits 165    Date for SLP Re-Evaluation 09/05/23    Authorization Type [employer] H[employer]    Authorization Time Period 03/21/2023 thru 08/22/2023    Authorization - Visit Number [id number]    Authorization - Number of Visits 60    Progress Note Due on Visit 140    SLP Start Time 1020    SLP Stop Time  1100    SLP Time Calculation (min) 40 min    Activity Tolerance Patient tolerated treatment well                   Past Medical History:  Diagnosis Date   CAD (coronary artery disease)    a. 10/2021 NSTEMI/PCI: LM nl, LAD min irregs, LCX 95m (2.5x18 Onyx Frontier DES), OM1 nl, RCA 58m (3.5x26 Onyx Frontier DES), 30d.   Diastolic dysfunction    a. 10/2021 Echo: EF 50-55%, no rwma, Gr1 DD, nl RV fxn. No significant valvular dzs.   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 SCCis. MOHs 07/08/22   Past Surgical History:  Procedure Laterality Date   COLONOSCOPY N/A 10/09/2015   Procedure: COLONOSCOPY;  Surgeon: Wallace Cullens, MD;  Location: [facility] ENDOSCOPY;  Service:  Gastroenterology;  Laterality: N/A;   CORONARY STENT INTERVENTION N/A 10/22/2021   Procedure: CORONARY STENT INTERVENTION;  Surgeon: Iran Ouch, MD;  Location: [facility] INVASIVE CV LAB;  Service: Cardiovascular;  Laterality: N/A;   IR CT HEAD LTD  05/21/2022   IR PERCUTANEOUS ART THROMBECTOMY/INFUSION INTRACRANIAL INC DIAG ANGIO  05/21/2022   LEFT HEART CATH AND CORONARY ANGIOGRAPHY N/A 10/22/2021   Procedure: LEFT HEART CATH AND CORONARY ANGIOGRAPHY;  Surgeon: Iran Ouch, MD;  Location: [facility] INVASIVE CV LAB;  Service: Cardiovascular;  Laterality: N/A;   LOOP RECORDER INSERTION N/A 05/23/2022   Procedure: LOOP RECORDER INSERTION;  Surgeon: Lanier Prude, MD;  Location: [facility] INVASIVE CV LAB;  Service: Cardiovascular;  Laterality: N/A;   RADIOLOGY WITH ANESTHESIA N/A 05/20/2022   Procedure: IR WITH ANESTHESIA;  Surgeon: Julieanne Cotton, MD;  Location: [facility] OR;  Service: Radiology;  Laterality: N/A;   ROTATOR CUFF REPAIR     Patient Active Problem List   Diagnosis Date Noted   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 male with PMH: HTN, CAD, HLD, osteoarthritis, SCC, sleep apnea. He presented to the hospital on 05/20/22 with right sided weakness and aphasia with LKW reported by wife to be 10pm on 10/2.  He arrived at [facility] as code stroke and taken for emergent CT/CTA which demonstrated left M1 occlusion. He received IV tenecteplase and IR thrombectomy due to large vessel occlusion.       DIAGNOSTIC FINDINGS:  MRI 05/22/2022 Cortical  ischemia throughout the left MCA territory compatible with recent infarct. Relatively little affect on the white matter. 2. Small focus of acute hemorrhage along the anterior surface of the left temporal lobe Heidelberg classification 3c: Subarachnoid hemorrhage. 3. Normal intracranial MRA.  Restored flow in left MCA.    THERAPY DIAG:  Aphasia  Apraxia  Middle cerebral artery embolism, left  Rationale for Evaluation and Treatment Rehabilitation  SUBJECTIVE:   PAIN:  Are you having pain? No  PATIENT GOALS: to be able to communicate again  SUBJECTIVE STATEMENT: Both report having a great Thanksgiving Pt accompanied by: self and significant other  OBJECTIVE:   TODAY'S TREATMENT: Skilled treatment session focused on pt's communication goals. SLP facilitated session by providing the following interventions:  SLP facilitated session by engaging pt and his wife in conversation detailing progress and potential goals. Pt's wife reports desire "for goals are for more conversation, he gets really frustrated, he assumes that I should know what he is talking about." Pt indicates this is correct by stating "well you should." She also expresses desire for pt to "speak more in sentences" for example when responding to greetings say "I am good" instead of "good."  Pt continually stated, "I do" but in fact he is not aware that he doesn't respond. Pt has also been observed typing information into Text to Speech and then showing the written word/app to the listeners vs playing and then saying the information. With maximal multimodal cues, SLP utilized simple basic questions about family member names to illustrate need to enter information, play it then say it. His wife benefited from maximal education on cuing pt to say the information. During one such instance, pt's verbal expression increased greatly vs when showing the written word on Text to Speech screen to listener.     PATIENT  EDUCATION: Education details: see above Person educated: Patient and Wife Education method: Explanation, Facilities manager, Verbal cues, and Handouts Education comprehension: verbalized understanding  HOME EXERCISE PROGRAM: Practice functional phrases contained in Text to Speech App Word lists  GOALS: Goals reviewed with patient? Yes  SHORT TERM GOALS: Target date: 10 sessions   UPDATED: 07/21/2023 3.   Pt will improve wording finding abilities by tying phrase level utterance into Text to Speech App with maximal faded to moderate cues.   5. Pt will improve spelling ability by accurately spelling SEMI-COMPLEX words during communication exchanges to increase listener understanding with min A in 6 out of 8 opportunities;  6.  Pt will read self-generated words during communication exchanges with > 75% speech intelligibility given Min A articulatory cues  7.  With multimodal cues from caregiver, pt will respond to basic simple questions in 5 out of 7 opportunities using multimodal forms of communication based on caregiver report.   LONG TERM GOALS: Target date: 09/04/2022     Pt will use multimodal communication to express basic wants/needs in 7 of 10 opportunities.  Baseline: unable Goal status: ONGOING;  2.  Pt will initiate communication in 3 of 5 opportunities as reported by caregiver outside of ST sessions.   Baseline: no initiation  Goal Status: Initial: ONGOING; his wife reports mild increase in attempts - progress made    ASSESSMENT:  CLINICAL IMPRESSION: Pt is a 77 year old male who was seen today for therapy targeting his aphasia. Pt with improved communication using text to speech app.  Pt continues to demonstrate improved speech intelligibility and use of accurate motor speech patterns. During today's session and during functional activities, pt with independent initiation of multimodal means of communication.   Pt very frustrated with tendency to to continue looking at his wife  and saying "you should know" during today's session when he was not able to communication his wants/needs. Pt with general decreased awareness of his deficits. Caregiver to increase pt's awareness thru use of verbal and visual cues. See the above detailed treatment note.    OBJECTIVE IMPAIRMENTS include expressive language, receptive language, aphasia, and apraxia. These impairments are limiting patient from managing medications, managing appointments, managing finances, household responsibilities, ADLs/IADLs, and effectively communicating at home and in community. Factors affecting potential to achieve goals and functional outcome are severity of impairments. Patient will benefit from skilled SLP services to address above impairments and improve overall function.   REHAB POTENTIAL: Excellent  PLAN: SLP FREQUENCY: 3x/week  SLP DURATION: 12 weeks  PLANNED INTERVENTIONS: Language facilitation, Environmental controls, Internal/external aids, Functional tasks, Multimodal communication approach, SLP instruction and feedback, Compensatory strategies, and Patient/family education    Marcelline Temkin B. Dreama Saa, M.S., CCC-SLP, Tree surgeon Certified Brain Injury Specialist [facility]  [facility] Rehabilitation Services Office [phone] Ascom [phone] Fax [phone]

## 2023-07-21 NOTE — Progress Notes (Signed)
[facility] Summary Report / Loop Recorder 

## 2023-07-23 ENCOUNTER — Ambulatory Visit: Payer: Medicare HMO | Admitting: Speech Pathology

## 2023-07-23 DIAGNOSIS — I6602 Occlusion and stenosis of left middle cerebral artery: Secondary | ICD-10-CM | POA: Diagnosis not present

## 2023-07-23 DIAGNOSIS — R4701 Aphasia: Secondary | ICD-10-CM

## 2023-07-23 DIAGNOSIS — R482 Apraxia: Secondary | ICD-10-CM | POA: Diagnosis not present

## 2023-07-23 NOTE — Therapy (Signed)
OUTPATIENT SPEECH LANGUAGE PATHOLOGY TREATMENT NOTE   Patient Name: Alan Stewart MRN: [id number] DOB:28-Aug-1945, 77 y.o., male 61 Date: 07/23/2023  PCP: Daniel Nones, MD REFERRING PROVIDER: Daniel Nones, MD   END OF SESSION  End of Session - 07/23/23 1043     Visit Number 141    Number of Visits 165    Date for SLP Re-Evaluation 09/05/23    Authorization Type [employer]    Authorization Time Period 03/21/2023 thru 08/22/2023    Authorization - Visit Number [id number]    Authorization - Number of Visits 60    Progress Note Due on Visit 150    SLP Start Time 1015    SLP Stop Time  1100    SLP Time Calculation (min) 45 min    Activity Tolerance Patient tolerated treatment well                   Past Medical History:  Diagnosis Date   CAD (coronary artery disease)    a. 10/2021 NSTEMI/PCI: LM nl, LAD min irregs, LCX 54m (2.5x18 Onyx Frontier DES), OM1 nl, RCA 1m (3.5x26 Onyx Frontier DES), 30d.   Diastolic dysfunction    a. 10/2021 Echo: EF 50-55%, no rwma, Gr1 DD, nl RV fxn. No significant valvular dzs.   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 SCCis. MOHs 07/08/22   Past Surgical History:  Procedure Laterality Date   COLONOSCOPY N/A 10/09/2015   Procedure: COLONOSCOPY;  Surgeon: Wallace Cullens, MD;  Location: [facility] ENDOSCOPY;  Service: Gastroenterology;  Laterality: N/A;   CORONARY STENT INTERVENTION N/A 10/22/2021   Procedure: CORONARY STENT INTERVENTION;  Surgeon: Iran Ouch, MD;  Location: [facility] INVASIVE CV LAB;  Service: Cardiovascular;  Laterality: N/A;   IR CT HEAD LTD  05/21/2022   IR PERCUTANEOUS ART THROMBECTOMY/INFUSION INTRACRANIAL INC DIAG ANGIO  05/21/2022   LEFT HEART CATH AND CORONARY ANGIOGRAPHY N/A 10/22/2021   Procedure: LEFT HEART  CATH AND CORONARY ANGIOGRAPHY;  Surgeon: Iran Ouch, MD;  Location: [facility] INVASIVE CV LAB;  Service: Cardiovascular;  Laterality: N/A;   LOOP RECORDER INSERTION N/A 05/23/2022   Procedure: LOOP RECORDER INSERTION;  Surgeon: Lanier Prude, MD;  Location: [facility] INVASIVE CV LAB;  Service: Cardiovascular;  Laterality: N/A;   RADIOLOGY WITH ANESTHESIA N/A 05/20/2022   Procedure: IR WITH ANESTHESIA;  Surgeon: Julieanne Cotton, MD;  Location: [facility] OR;  Service: Radiology;  Laterality: N/A;   ROTATOR CUFF REPAIR     Patient Active Problem List   Diagnosis Date Noted   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 male with PMH: HTN, CAD, HLD, osteoarthritis, SCC, sleep apnea. He presented to the hospital on 05/20/22 with right sided weakness and aphasia with LKW reported by wife to be 10pm on 10/2.  He arrived  at [facility] as code stroke and taken for emergent CT/CTA which demonstrated left M1 occlusion. He received IV tenecteplase and IR thrombectomy due to large vessel occlusion.       DIAGNOSTIC FINDINGS:  MRI 05/22/2022 Cortical ischemia throughout the left MCA territory compatible with recent infarct. Relatively little affect on the white matter. 2. Small focus of acute hemorrhage along the anterior surface of the left temporal lobe Heidelberg classification 3c: Subarachnoid hemorrhage. 3. Normal intracranial MRA.  Restored flow in left MCA.    THERAPY DIAG:  Aphasia  Apraxia  Middle cerebral artery embolism, left  Rationale for  Evaluation and Treatment Rehabilitation  SUBJECTIVE:   PAIN:  Are you having pain? No  PATIENT GOALS: to be able to communicate again  SUBJECTIVE STATEMENT: Pt's wife reports he spoke in some complete sentences the other day Pt accompanied by: self and significant other  OBJECTIVE:   TODAY'S TREATMENT: Skilled treatment session focused on pt's communication goals. SLP facilitated session by providing the following interventions:  SLP provided continued prompts for pt to use Text to Speech App to answer questions regarding basic conversation - pt required Min A to use functions of the app and Min A for spelling accuracy - with moderate A, pt able to functionally repeat words/phrases with > 80% speech intelligibility  SLP further simulated different social situations including when his wife arrives at home after work - pt with increased response with practice on the phrases to select on APP as a greeting    PATIENT EDUCATION: Education details: see above Person educated: Patient and Wife Education method: Programmer, multimedia, Facilities manager, Verbal cues, and Handouts Education comprehension: verbalized understanding  HOME EXERCISE PROGRAM: Practice functional phrases contained in Text to Speech App Word lists  GOALS: Goals reviewed with patient? Yes  SHORT TERM GOALS: Target date: 10 sessions   UPDATED: 07/21/2023 3.   Pt will improve wording finding abilities by tying phrase level utterance into Text to Speech App with maximal faded to moderate cues.   5. Pt will improve spelling ability by accurately spelling SEMI-COMPLEX words during communication exchanges to increase listener understanding with min A in 6 out of 8 opportunities;  6.  Pt will read self-generated words during communication exchanges with > 75% speech intelligibility given Min A articulatory cues  7.  With multimodal cues from caregiver, pt will respond to basic simple questions in 5 out of 7 opportunities using  multimodal forms of communication based on caregiver report.   LONG TERM GOALS: Target date: 09/04/2022     Pt will use multimodal communication to express basic wants/needs in 7 of 10 opportunities.  Baseline: unable Goal status: ONGOING;  2.  Pt will initiate communication in 3 of 5 opportunities as reported by caregiver outside of ST sessions.   Baseline: no initiation  Goal Status: Initial: ONGOING; his wife reports mild increase in attempts - progress made    ASSESSMENT:  CLINICAL IMPRESSION: Pt is a 77 year old male who was seen today for therapy targeting his aphasia. Pt with improved communication using text to speech app.  Pt continues to demonstrate improved speech intelligibility and use of accurate motor speech patterns. During today's session and during functional activities, pt with independent initiation of multimodal means of communication.   Pt benefits from heavy prompts to use Text to Speech App to text descriptors to aid in communication. Such as typing "cabbage" instead of "slaw" during moments of word finding difficulty.  Caregiver to increase pt's awareness thru use  of verbal and visual cues. See the above detailed treatment note.    OBJECTIVE IMPAIRMENTS include expressive language, receptive language, aphasia, and apraxia. These impairments are limiting patient from managing medications, managing appointments, managing finances, household responsibilities, ADLs/IADLs, and effectively communicating at home and in community. Factors affecting potential to achieve goals and functional outcome are severity of impairments. Patient will benefit from skilled SLP services to address above impairments and improve overall function.   REHAB POTENTIAL: Excellent  PLAN: SLP FREQUENCY: 3x/week  SLP DURATION: 12 weeks  PLANNED INTERVENTIONS: Language facilitation, Environmental controls, Internal/external aids, Functional tasks, Multimodal communication approach, SLP  instruction and feedback, Compensatory strategies, and Patient/family education    Teana Lindahl B. Dreama Saa, M.S., CCC-SLP, Tree surgeon Certified Brain Injury Specialist [facility]  [facility] Rehabilitation Services Office [phone] Ascom [phone] Fax [phone]

## 2023-07-25 ENCOUNTER — Ambulatory Visit: Payer: Medicare HMO | Admitting: Speech Pathology

## 2023-07-28 ENCOUNTER — Ambulatory Visit: Payer: Medicare HMO | Admitting: Speech Pathology

## 2023-07-28 DIAGNOSIS — R482 Apraxia: Secondary | ICD-10-CM | POA: Diagnosis not present

## 2023-07-28 DIAGNOSIS — R4701 Aphasia: Secondary | ICD-10-CM

## 2023-07-28 DIAGNOSIS — I6602 Occlusion and stenosis of left middle cerebral artery: Secondary | ICD-10-CM

## 2023-07-28 NOTE — Therapy (Signed)
OUTPATIENT SPEECH LANGUAGE PATHOLOGY TREATMENT NOTE   Patient Name: Alan Stewart MRN: [id number] DOB:1946/07/24, 77 y.o., male 49 Date: 07/28/2023  PCP: Daniel Nones, MD REFERRING PROVIDER: Daniel Nones, MD   END OF SESSION  End of Session - 07/28/23 1020     Visit Number 142    Number of Visits 165    Date for SLP Re-Evaluation 09/05/23    Authorization Type [employer] H[employer]    Authorization Time Period 03/21/2023 thru 08/22/2023    Authorization - Visit Number [id number]    Authorization - Number of Visits 60    Progress Note Due on Visit 150    SLP Start Time 1015    SLP Stop Time  1100    SLP Time Calculation (min) 45 min    Activity Tolerance Patient tolerated treatment well                   Past Medical History:  Diagnosis Date   CAD (coronary artery disease)    a. 10/2021 NSTEMI/PCI: LM nl, LAD min irregs, LCX 20m (2.5x18 Onyx Frontier DES), OM1 nl, RCA 15m (3.5x26 Onyx Frontier DES), 30d.   Diastolic dysfunction    a. 10/2021 Echo: EF 50-55%, no rwma, Gr1 DD, nl RV fxn. No significant valvular dzs.   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 SCCis. MOHs 07/08/22   Past Surgical History:  Procedure Laterality Date   COLONOSCOPY N/A 10/09/2015   Procedure: COLONOSCOPY;  Surgeon: Wallace Cullens, MD;  Location: [facility] ENDOSCOPY;  Service: Gastroenterology;  Laterality: N/A;   CORONARY STENT INTERVENTION N/A 10/22/2021   Procedure: CORONARY STENT INTERVENTION;  Surgeon: Iran Ouch, MD;  Location: [facility] INVASIVE CV LAB;  Service: Cardiovascular;  Laterality: N/A;   IR CT HEAD LTD  05/21/2022   IR PERCUTANEOUS ART THROMBECTOMY/INFUSION INTRACRANIAL INC DIAG ANGIO  05/21/2022   LEFT HEART CATH AND CORONARY ANGIOGRAPHY N/A 10/22/2021   Procedure: LEFT HEART  CATH AND CORONARY ANGIOGRAPHY;  Surgeon: Iran Ouch, MD;  Location: [facility] INVASIVE CV LAB;  Service: Cardiovascular;  Laterality: N/A;   LOOP RECORDER INSERTION N/A 05/23/2022   Procedure: LOOP RECORDER INSERTION;  Surgeon: Lanier Prude, MD;  Location: [facility] INVASIVE CV LAB;  Service: Cardiovascular;  Laterality: N/A;   RADIOLOGY WITH ANESTHESIA N/A 05/20/2022   Procedure: IR WITH ANESTHESIA;  Surgeon: Julieanne Cotton, MD;  Location: [facility] OR;  Service: Radiology;  Laterality: N/A;   ROTATOR CUFF REPAIR     Patient Active Problem List   Diagnosis Date Noted   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 male with PMH: HTN, CAD, HLD, osteoarthritis, SCC, sleep apnea. He presented to the hospital on 05/20/22 with right sided weakness and aphasia with LKW reported by wife to be 10pm on 10/2.  He arrived  at [facility] as code stroke and taken for emergent CT/CTA which demonstrated left M1 occlusion. He received IV tenecteplase and IR thrombectomy due to large vessel occlusion.       DIAGNOSTIC FINDINGS:  MRI 05/22/2022 Cortical ischemia throughout the left MCA territory compatible with recent infarct. Relatively little affect on the white matter. 2. Small focus of acute hemorrhage along the anterior surface of the left temporal lobe Heidelberg classification 3c: Subarachnoid hemorrhage. 3. Normal intracranial MRA.  Restored flow in left MCA.    THERAPY DIAG:  Aphasia  Apraxia  Middle cerebral artery embolism, left  Rationale for  Evaluation and Treatment Rehabilitation  SUBJECTIVE:   PAIN:  Are you having pain? No  PATIENT GOALS: to be able to communicate again  SUBJECTIVE STATEMENT: Pt's wife reports he spoke in some complete sentences the other day Pt accompanied by: self and significant other  OBJECTIVE:   TODAY'S TREATMENT: Skilled treatment session focused on pt's communication goals. SLP facilitated session by providing the following interventions:  SLP provided continued prompts for pt to use Text to Speech App to answer questions regarding basic conversation - pt required Min A to use functions of the app and he was supervision level for finding requested functional phrases in 3 out of 5 opportunities during various scenarios with ~ 85% intelligible speech at the functional repetition level (phrase to sentence)   SLP further simulated different social situations including when his wife arrives at home after work - pt with increased response with practice on the phrases to select on APP as a greeting    PATIENT EDUCATION: Education details: see above Person educated: Patient and Wife Education method: Programmer, multimedia, Facilities manager, Verbal cues, and Handouts Education comprehension: verbalized understanding  HOME EXERCISE PROGRAM: Practice functional phrases contained in Text to Speech App Word lists  GOALS: Goals reviewed with patient? Yes  SHORT TERM GOALS: Target date: 10 sessions   UPDATED: 07/21/2023 3.   Pt will improve wording finding abilities by tying phrase level utterance into Text to Speech App with maximal faded to moderate cues.   5. Pt will improve spelling ability by accurately spelling SEMI-COMPLEX words during communication exchanges to increase listener understanding with min A in 6 out of 8 opportunities;  6.  Pt will read self-generated words during communication exchanges with > 75% speech intelligibility given Min A articulatory cues  7.  With multimodal cues from caregiver,  pt will respond to basic simple questions in 5 out of 7 opportunities using multimodal forms of communication based on caregiver report.   LONG TERM GOALS: Target date: 09/04/2022     Pt will use multimodal communication to express basic wants/needs in 7 of 10 opportunities.  Baseline: unable Goal status: ONGOING;  2.  Pt will initiate communication in 3 of 5 opportunities as reported by caregiver outside of ST sessions.   Baseline: no initiation  Goal Status: Initial: ONGOING; his wife reports mild increase in attempts - progress made    ASSESSMENT:  CLINICAL IMPRESSION: Pt is a 77 year old male who was seen today for therapy targeting his aphasia. Pt with improved communication using text to speech app.  Pt continues to demonstrate improved speech intelligibility and use of accurate motor speech patterns. During today's session and during functional activities, pt with independent initiation of multimodal means of communication.   Pt benefits from heavy prompts to use Text to Speech App to text descriptors to aid in communication. Such as typing "cabbage" instead of "slaw" during moments  of word finding difficulty.  Caregiver to increase pt's awareness thru use of verbal and visual cues. See the above detailed treatment note.    OBJECTIVE IMPAIRMENTS include expressive language, receptive language, aphasia, and apraxia. These impairments are limiting patient from managing medications, managing appointments, managing finances, household responsibilities, ADLs/IADLs, and effectively communicating at home and in community. Factors affecting potential to achieve goals and functional outcome are severity of impairments. Patient will benefit from skilled SLP services to address above impairments and improve overall function.   REHAB POTENTIAL: Excellent  PLAN: SLP FREQUENCY: 3x/week  SLP DURATION: 12 weeks  PLANNED INTERVENTIONS: Language facilitation, Environmental controls,  Internal/external aids, Functional tasks, Multimodal communication approach, SLP instruction and feedback, Compensatory strategies, and Patient/family education    Wynell Halberg B. Dreama Saa, M.S., CCC-SLP, Tree surgeon Certified Brain Injury Specialist [facility]  [facility] Rehabilitation Services Office [phone] Ascom [phone] Fax [phone]

## 2023-07-29 ENCOUNTER — Encounter: Payer: Self-pay | Admitting: Dermatology

## 2023-07-29 ENCOUNTER — Ambulatory Visit: Payer: Medicare HMO | Admitting: Dermatology

## 2023-07-29 DIAGNOSIS — L821 Other seborrheic keratosis: Secondary | ICD-10-CM | POA: Diagnosis not present

## 2023-07-29 DIAGNOSIS — Z85828 Personal history of other malignant neoplasm of skin: Secondary | ICD-10-CM | POA: Diagnosis not present

## 2023-07-29 DIAGNOSIS — L814 Other melanin hyperpigmentation: Secondary | ICD-10-CM | POA: Diagnosis not present

## 2023-07-29 DIAGNOSIS — Z1283 Encounter for screening for malignant neoplasm of skin: Secondary | ICD-10-CM

## 2023-07-29 DIAGNOSIS — L578 Other skin changes due to chronic exposure to nonionizing radiation: Secondary | ICD-10-CM

## 2023-07-29 DIAGNOSIS — L570 Actinic keratosis: Secondary | ICD-10-CM

## 2023-07-29 DIAGNOSIS — D1801 Hemangioma of skin and subcutaneous tissue: Secondary | ICD-10-CM | POA: Diagnosis not present

## 2023-07-29 DIAGNOSIS — W908XXA Exposure to other nonionizing radiation, initial encounter: Secondary | ICD-10-CM

## 2023-07-29 DIAGNOSIS — D229 Melanocytic nevi, unspecified: Secondary | ICD-10-CM

## 2023-07-29 DIAGNOSIS — Z86007 Personal history of in-situ neoplasm of skin: Secondary | ICD-10-CM | POA: Diagnosis not present

## 2023-07-29 NOTE — Progress Notes (Signed)
Follow-Up Visit   Subjective  Alan Stewart is a 77 y.o. male who presents for the following: Skin Cancer Screening and Full Body Skin Exam  The patient presents for Total-Body Skin Exam (TBSE) for skin cancer screening and mole check. The patient has spots, moles and lesions to be evaluated, some may be new or changing and the patient may have concern these could be cancer.  Patient with hx of SCC. Patient accompanied by wife who contributes to history.  The following portions of the chart were reviewed this encounter and updated as appropriate: medications, allergies, medical history  Review of Systems:  No other skin or systemic complaints except as noted in HPI or Assessment and Plan.  Objective  Well appearing patient in no apparent distress; mood and affect are within normal limits.  A full examination was performed including scalp, head, eyes, ears, nose, lips, neck, chest, axillae, abdomen, back, buttocks, bilateral upper extremities, bilateral lower extremities, hands, feet, fingers, toes, fingernails, and toenails. All findings within normal limits unless otherwise noted below.   Relevant physical exam findings are noted in the Assessment and Plan.  vertex scalp, forehead, dorsal hands, forearms (22) Erythematous thin papules/macules with gritty scale.     Assessment & Plan   SKIN CANCER SCREENING PERFORMED TODAY.  ACTINIC DAMAGE - Chronic condition, secondary to cumulative UV/sun exposure - diffuse scaly erythematous macules with underlying dyspigmentation - Recommend daily broad spectrum sunscreen SPF 30+ to sun-exposed areas, reapply every 2 hours as needed.  - Staying in the shade or wearing long sleeves, sun glasses (UVA+UVB protection) and wide brim hats (4-inch brim around the entire circumference of the hat) are also recommended for sun protection.  - Call for new or changing lesions.  LENTIGINES, SEBORRHEIC KERATOSES, HEMANGIOMAS - Benign normal skin  lesions - Benign-appearing - Call for any changes  MELANOCYTIC NEVI - Tan-brown and/or pink-flesh-colored symmetric macules and papules - Benign appearing on exam today - Observation - Call clinic for new or changing moles - Recommend daily use of broad spectrum spf 30+ sunscreen to sun-exposed areas.   HISTORY OF SQUAMOUS CELL CARCINOMA OF THE SKIN - No evidence of recurrence today at left dorsal forearm - No lymphadenopathy - Recommend regular full body skin exams - Recommend daily broad spectrum sunscreen SPF 30+ to sun-exposed areas, reapply every 2 hours as needed.  - Call if any new or changing lesions are noted between office visits  HISTORY OF SQUAMOUS CELL CARCINOMA IN SITU OF THE SKIN - No evidence of recurrence today at right 2nd prox finger, Mohs - Recommend regular full body skin exams - Recommend daily broad spectrum sunscreen SPF 30+ to sun-exposed areas, reapply every 2 hours as needed.  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 They are chronic with expected duration over 1 year. A portion of actinic keratoses will progress to squamous cell carcinoma of the skin. It is not possible to reliably predict which spots will progress to skin cancer and so treatment is recommended to prevent development of skin cancer.  Recommend daily broad spectrum sunscreen SPF 30+ to sun-exposed areas, reapply every 2 hours as needed.  Recommend staying in the shade or wearing long sleeves, sun glasses (UVA+UVB protection) and wide brim hats (4-inch brim around the entire circumference of  the hat). Call for new or changing lesions.   Destruction of lesion - vertex scalp, forehead, dorsal hands, forearms (22) Complexity: simple   Destruction method:  cryotherapy   Informed consent: discussed and consent obtained   Timeout:  patient name, date of birth, surgical site, and procedure verified Lesion destroyed using liquid nitrogen: Yes   Region frozen until ice ball extended beyond lesion: Yes   Cryo cycles: 1 or 2. Outcome: patient tolerated procedure well with no complications   Post-procedure details: wound care instructions given    Multiple benign nevi  Lentigines  Actinic elastosis  Seborrheic keratoses  Cherry angioma   Return in about 6 months (around 01/27/2024) for AK follow up, Hx SCC, Hx SCCis, TBSE.  Anise Salvo, RMA, am acting as scribe for Elie Goody, MD .   Documentation: I have reviewed the above documentation for accuracy and completeness, and I agree with the above.  Elie Goody, MD

## 2023-07-29 NOTE — Patient Instructions (Signed)

## 2023-07-30 ENCOUNTER — Ambulatory Visit: Payer: Medicare HMO | Admitting: Speech Pathology

## 2023-08-01 ENCOUNTER — Ambulatory Visit: Payer: Medicare HMO | Admitting: Speech Pathology

## 2023-08-04 ENCOUNTER — Ambulatory Visit: Payer: Medicare HMO

## 2023-08-04 ENCOUNTER — Ambulatory Visit: Payer: Medicare HMO | Admitting: Speech Pathology

## 2023-08-04 DIAGNOSIS — R4701 Aphasia: Secondary | ICD-10-CM | POA: Diagnosis not present

## 2023-08-04 DIAGNOSIS — I6602 Occlusion and stenosis of left middle cerebral artery: Secondary | ICD-10-CM | POA: Diagnosis not present

## 2023-08-04 DIAGNOSIS — R482 Apraxia: Secondary | ICD-10-CM | POA: Diagnosis not present

## 2023-08-04 NOTE — Therapy (Signed)
OUTPATIENT SPEECH LANGUAGE PATHOLOGY TREATMENT NOTE   Patient Name: Alan Stewart MRN: [id number] DOB:1946/04/16, 77 y.o., male 26 Date: 08/04/2023  PCP: Daniel Nones, MD REFERRING PROVIDER: Daniel Nones, MD   END OF SESSION  End of Session - 08/04/23 1020     Visit Number 143    Number of Visits 165    Date for SLP Re-Evaluation 09/05/23    Authorization Type [employer] H[employer]    Authorization Time Period 03/21/2023 thru 08/22/2023    Authorization - Visit Number 41    Authorization - Number of Visits 60    Progress Note Due on Visit 150    SLP Start Time 1015    SLP Stop Time  1100    SLP Time Calculation (min) 45 min    Activity Tolerance Patient tolerated treatment well                   Past Medical History:  Diagnosis Date   CAD (coronary artery disease)    a. 10/2021 NSTEMI/PCI: LM nl, LAD min irregs, LCX 45m (2.5x18 Onyx Frontier DES), OM1 nl, RCA 30m (3.5x26 Onyx Frontier DES), 30d.   Diastolic dysfunction    a. 10/2021 Echo: EF 50-55%, no rwma, Gr1 DD, nl RV fxn. No significant valvular dzs.   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 SCCis. MOHs 07/08/22   Past Surgical History:  Procedure Laterality Date   COLONOSCOPY N/A 10/09/2015   Procedure: COLONOSCOPY;  Surgeon: Wallace Cullens, MD;  Location: [facility] ENDOSCOPY;  Service: Gastroenterology;  Laterality: N/A;   CORONARY STENT INTERVENTION N/A 10/22/2021   Procedure: CORONARY STENT INTERVENTION;  Surgeon: Iran Ouch, MD;  Location: [facility] INVASIVE CV LAB;  Service: Cardiovascular;  Laterality: N/A;   IR CT HEAD LTD  05/21/2022   IR PERCUTANEOUS ART THROMBECTOMY/INFUSION INTRACRANIAL INC DIAG ANGIO  05/21/2022   LEFT HEART CATH AND CORONARY ANGIOGRAPHY N/A 10/22/2021   Procedure: LEFT HEART  CATH AND CORONARY ANGIOGRAPHY;  Surgeon: Iran Ouch, MD;  Location: [facility] INVASIVE CV LAB;  Service: Cardiovascular;  Laterality: N/A;   LOOP RECORDER INSERTION N/A 05/23/2022   Procedure: LOOP RECORDER INSERTION;  Surgeon: Lanier Prude, MD;  Location: [facility] INVASIVE CV LAB;  Service: Cardiovascular;  Laterality: N/A;   RADIOLOGY WITH ANESTHESIA N/A 05/20/2022   Procedure: IR WITH ANESTHESIA;  Surgeon: Julieanne Cotton, MD;  Location: [facility] OR;  Service: Radiology;  Laterality: N/A;   ROTATOR CUFF REPAIR     Patient Active Problem List   Diagnosis Date Noted   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 male with PMH: HTN, CAD, HLD, osteoarthritis, SCC, sleep apnea. He presented to the hospital on 05/20/22 with right sided weakness and aphasia with LKW reported by wife to be 10pm on 10/2.  He arrived  at [facility] as code stroke and taken for emergent CT/CTA which demonstrated left M1 occlusion. He received IV tenecteplase and IR thrombectomy due to large vessel occlusion.       DIAGNOSTIC FINDINGS:  MRI 05/22/2022 Cortical ischemia throughout the left MCA territory compatible with recent infarct. Relatively little affect on the white matter. 2. Small focus of acute hemorrhage along the anterior surface of the left temporal lobe Heidelberg classification 3c: Subarachnoid hemorrhage. 3. Normal intracranial MRA.  Restored flow in left MCA.    THERAPY DIAG:  Aphasia  Apraxia  Rationale for Evaluation and Treatment  Rehabilitation  SUBJECTIVE:   PAIN:  Are you having pain? No  PATIENT GOALS: to be able to communicate again  SUBJECTIVE STATEMENT: Pt and his wife watched [employer] football games and this Clinical research associate joked with pt about the [employer] football game over the weekend.  Pt accompanied by: self and significant other  OBJECTIVE:   TODAY'S TREATMENT: Skilled treatment session focused on pt's communication goals. SLP facilitated session by providing the following interventions:  [employer] game utilized to facilitate use of TEXT to [employer] - pt required continued cues to effectively use features within the app d/t lack of use outside of ST sessions. Pt able to accuracy answer the questions with use of app but required cues to "play and say" to facilitate language/speech growth. Pt stated "she can read." Continued education provided to pt on need to practice motor speech movements by listening and then repeating functional information. Will continue to promote as pt's speech intelligibility was greatly increased during functional repetition (75% at the word level).     PATIENT EDUCATION: Education details: see above Person educated: Patient and Wife Education method: Explanation, Facilities manager, Verbal cues, and Handouts Education comprehension: verbalized understanding  HOME EXERCISE PROGRAM: Practice functional phrases contained in Text to Speech App Word lists  GOALS: Goals reviewed with patient? Yes  SHORT TERM GOALS: Target date: 10 sessions   UPDATED: 07/21/2023 3.   Pt will improve wording finding abilities by tying phrase level utterance into Text to Speech App with maximal faded to moderate cues.   5. Pt will improve spelling ability by accurately spelling SEMI-COMPLEX words during communication exchanges to increase listener understanding with min A in 6 out of 8 opportunities;  6.  Pt will read self-generated words during communication exchanges with > 75% speech  intelligibility given Min A articulatory cues  7.  With multimodal cues from caregiver, pt will respond to basic simple questions in 5 out of 7 opportunities using multimodal forms of communication based on caregiver report.   LONG TERM GOALS: Target date: 09/04/2022     Pt will use multimodal communication to express basic wants/needs in 7 of 10 opportunities.  Baseline: unable Goal status: ONGOING;  2.  Pt will initiate communication in 3 of 5 opportunities as reported by caregiver outside of ST sessions.   Baseline: no initiation  Goal Status: Initial: ONGOING; his wife reports mild increase in attempts - progress made    ASSESSMENT:  CLINICAL IMPRESSION: Pt is a 77 year old male who was seen today for therapy targeting his aphasia. Pt with improved communication using text to speech app.  Pt continues to demonstrate improved speech intelligibility and use of accurate motor speech patterns. During today's session and during functional activities, pt with independent initiation of multimodal means of communication.   Pt benefits from heavy prompts to use Text to Speech App to text descriptors to aid in communication. Caregiver  to increase pt's awareness thru use of verbal and visual cues. See the above detailed treatment note.    OBJECTIVE IMPAIRMENTS include expressive language, receptive language, aphasia, and apraxia. These impairments are limiting patient from managing medications, managing appointments, managing finances, household responsibilities, ADLs/IADLs, and effectively communicating at home and in community. Factors affecting potential to achieve goals and functional outcome are severity of impairments. Patient will benefit from skilled SLP services to address above impairments and improve overall function.   REHAB POTENTIAL: Excellent  PLAN: SLP FREQUENCY: 3x/week  SLP DURATION: 12 weeks  PLANNED INTERVENTIONS: Language facilitation, Environmental controls,  Internal/external aids, Functional tasks, Multimodal communication approach, SLP instruction and feedback, Compensatory strategies, and Patient/family education    Charleton Deyoung B. Dreama Saa, M.S., CCC-SLP, Tree surgeon Certified Brain Injury Specialist [facility]  [facility] Rehabilitation Services Office [phone] Ascom [phone] Fax [phone]

## 2023-08-06 ENCOUNTER — Ambulatory Visit: Payer: Medicare HMO | Admitting: Speech Pathology

## 2023-08-06 DIAGNOSIS — R4701 Aphasia: Secondary | ICD-10-CM

## 2023-08-06 DIAGNOSIS — R482 Apraxia: Secondary | ICD-10-CM

## 2023-08-06 DIAGNOSIS — I6602 Occlusion and stenosis of left middle cerebral artery: Secondary | ICD-10-CM | POA: Diagnosis not present

## 2023-08-06 NOTE — Therapy (Signed)
OUTPATIENT SPEECH LANGUAGE PATHOLOGY TREATMENT NOTE   Patient Name: Alan Stewart MRN: [id number] DOB:1945-12-27, 77 y.o., male 57 Date: 08/06/2023  PCP: Daniel Nones, MD REFERRING PROVIDER: Daniel Nones, MD   END OF SESSION  End of Session - 08/06/23 0953     Visit Number 144    Number of Visits 165    Date for SLP Re-Evaluation 09/05/23    Authorization Type [employer] HMO    Authorization Time Period 03/21/2023 thru 08/22/2023    Authorization - Visit Number 42    Authorization - Number of Visits 60    Progress Note Due on Visit 150    SLP Start Time 0845    SLP Stop Time  0930    SLP Time Calculation (min) 45 min    Activity Tolerance Patient tolerated treatment well                   Past Medical History:  Diagnosis Date   CAD (coronary artery disease)    a. 10/2021 NSTEMI/PCI: LM nl, LAD min irregs, LCX 1m (2.5x18 Onyx Frontier DES), OM1 nl, RCA 28m (3.5x26 Onyx Frontier DES), 30d.   Diastolic dysfunction    a. 10/2021 Echo: EF 50-55%, no rwma, Gr1 DD, nl RV fxn. No significant valvular dzs.   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 SCCis. MOHs 07/08/22   Past Surgical History:  Procedure Laterality Date   COLONOSCOPY N/A 10/09/2015   Procedure: COLONOSCOPY;  Surgeon: Wallace Cullens, MD;  Location: [facility] ENDOSCOPY;  Service: Gastroenterology;  Laterality: N/A;   CORONARY STENT INTERVENTION N/A 10/22/2021   Procedure: CORONARY STENT INTERVENTION;  Surgeon: Iran Ouch, MD;  Location: [facility] INVASIVE CV LAB;  Service: Cardiovascular;  Laterality: N/A;   IR CT HEAD LTD  05/21/2022   IR PERCUTANEOUS ART THROMBECTOMY/INFUSION INTRACRANIAL INC DIAG ANGIO  05/21/2022   LEFT HEART CATH AND CORONARY ANGIOGRAPHY N/A 10/22/2021   Procedure: LEFT HEART  CATH AND CORONARY ANGIOGRAPHY;  Surgeon: Iran Ouch, MD;  Location: [facility] INVASIVE CV LAB;  Service: Cardiovascular;  Laterality: N/A;   LOOP RECORDER INSERTION N/A 05/23/2022   Procedure: LOOP RECORDER INSERTION;  Surgeon: Lanier Prude, MD;  Location: [facility] INVASIVE CV LAB;  Service: Cardiovascular;  Laterality: N/A;   RADIOLOGY WITH ANESTHESIA N/A 05/20/2022   Procedure: IR WITH ANESTHESIA;  Surgeon: Julieanne Cotton, MD;  Location: [facility] OR;  Service: Radiology;  Laterality: N/A;   ROTATOR CUFF REPAIR     Patient Active Problem List   Diagnosis Date Noted   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 male with PMH: HTN, CAD, HLD, osteoarthritis, SCC, sleep apnea. He presented to the hospital on 05/20/22 with right sided weakness and aphasia with LKW reported by wife to be 10pm on 10/2.  He arrived  at [facility] as code stroke and taken for emergent CT/CTA which demonstrated left M1 occlusion. He received IV tenecteplase and IR thrombectomy due to large vessel occlusion.       DIAGNOSTIC FINDINGS:  MRI 05/22/2022 Cortical ischemia throughout the left MCA territory compatible with recent infarct. Relatively little affect on the white matter. 2. Small focus of acute hemorrhage along the anterior surface of the left temporal lobe Heidelberg classification 3c: Subarachnoid hemorrhage. 3. Normal intracranial MRA.  Restored flow in left MCA.    THERAPY DIAG:  Aphasia  Apraxia  Rationale for Evaluation and Treatment  Rehabilitation  SUBJECTIVE:   PAIN:  Are you having pain? No  PATIENT GOALS: to be able to communicate again  SUBJECTIVE STATEMENT: Pt and his wife will be out of town next week - pt with increased unstructured comments Pt accompanied by: self and significant other  OBJECTIVE:   TODAY'S TREATMENT: Skilled treatment session focused on pt's communication goals. SLP facilitated session by providing the following interventions:  [employer] game utilized to facilitate use of TEXT to [employer] - pt required decreased cues to effectively use features within the app d/t lack of use outside of ST sessions. Pt able to accurately answer the questions with use of app with increased attempts to repeat spoken language.   PATIENT EDUCATION: Education details: see above Person educated: Patient and Wife Education method: Explanation, Facilities manager, Verbal cues, and Handouts Education comprehension: verbalized understanding  HOME EXERCISE PROGRAM: Practice functional phrases contained in Text to Speech App Word lists  GOALS: Goals reviewed with patient? Yes  SHORT TERM GOALS: Target date: 10 sessions   UPDATED: 07/21/2023 3.   Pt will improve wording finding abilities by tying phrase level utterance into Text to Speech App with maximal faded to moderate cues.   5. Pt will improve spelling ability by accurately spelling SEMI-COMPLEX words during communication exchanges to increase listener understanding with min A in 6 out of 8 opportunities;  6.  Pt will read self-generated words during communication exchanges with > 75% speech intelligibility given Min A articulatory cues  7.  With multimodal cues from caregiver, pt will respond to basic simple questions in 5 out of 7 opportunities using multimodal forms of communication based on caregiver report.   LONG TERM GOALS: Target date: 09/04/2022     Pt will use multimodal communication to express basic wants/needs in 7 of 10 opportunities.   Baseline: unable Goal status: ONGOING;  2.  Pt will initiate communication in 3 of 5 opportunities as reported by caregiver outside of ST sessions.   Baseline: no initiation  Goal Status: Initial: ONGOING; his wife reports mild increase in attempts - progress made    ASSESSMENT:  CLINICAL IMPRESSION: Pt is a 77 year old male who was seen today for therapy targeting his aphasia. Pt with improved communication using text to speech app.  Pt continues to demonstrate improved speech intelligibility and use of accurate motor speech patterns. During today's session and during functional activities, pt with independent initiation of multimodal means of communication.   See the above detailed treatment note.    OBJECTIVE IMPAIRMENTS include expressive language, receptive language, aphasia, and apraxia. These impairments are limiting patient from managing medications, managing appointments, managing finances, household responsibilities, ADLs/IADLs, and effectively communicating at home and in community. Factors affecting potential to achieve goals and functional outcome are severity of impairments. Patient will benefit from skilled SLP services to address above impairments and improve overall function.   REHAB POTENTIAL:  Excellent  PLAN: SLP FREQUENCY: 3x/week  SLP DURATION: 12 weeks  PLANNED INTERVENTIONS: Language facilitation, Environmental controls, Internal/external aids, Functional tasks, Multimodal communication approach, SLP instruction and feedback, Compensatory strategies, and Patient/family education    Alysse Rathe B. Dreama Saa, M.S., CCC-SLP, Tree surgeon Certified Brain Injury Specialist [facility]  [facility] Rehabilitation Services Office [phone] Ascom [phone] Fax [phone]

## 2023-08-08 ENCOUNTER — Ambulatory Visit: Payer: Medicare HMO | Admitting: Speech Pathology

## 2023-08-11 ENCOUNTER — Ambulatory Visit: Payer: Medicare HMO | Admitting: Speech Pathology

## 2023-08-15 ENCOUNTER — Ambulatory Visit: Payer: Medicare HMO | Admitting: Speech Pathology

## 2023-08-18 ENCOUNTER — Ambulatory Visit: Payer: Medicare HMO | Admitting: Speech Pathology

## 2023-08-18 DIAGNOSIS — I6602 Occlusion and stenosis of left middle cerebral artery: Secondary | ICD-10-CM | POA: Diagnosis not present

## 2023-08-18 DIAGNOSIS — R4701 Aphasia: Secondary | ICD-10-CM | POA: Diagnosis not present

## 2023-08-18 DIAGNOSIS — R482 Apraxia: Secondary | ICD-10-CM

## 2023-08-22 ENCOUNTER — Ambulatory Visit: Payer: Medicare HMO | Admitting: Speech Pathology

## 2023-08-22 ENCOUNTER — Ambulatory Visit: Payer: Medicare HMO | Attending: Internal Medicine | Admitting: Speech Pathology

## 2023-08-22 DIAGNOSIS — R4701 Aphasia: Secondary | ICD-10-CM | POA: Insufficient documentation

## 2023-08-22 DIAGNOSIS — I6602 Occlusion and stenosis of left middle cerebral artery: Secondary | ICD-10-CM | POA: Insufficient documentation

## 2023-08-22 DIAGNOSIS — R482 Apraxia: Secondary | ICD-10-CM | POA: Insufficient documentation

## 2023-08-25 ENCOUNTER — Ambulatory Visit: Payer: Medicare HMO | Admitting: Speech Pathology

## 2023-08-27 ENCOUNTER — Ambulatory Visit: Payer: Medicare HMO | Admitting: Speech Pathology

## 2023-08-27 DIAGNOSIS — R4701 Aphasia: Secondary | ICD-10-CM

## 2023-08-27 DIAGNOSIS — I6602 Occlusion and stenosis of left middle cerebral artery: Secondary | ICD-10-CM | POA: Diagnosis not present

## 2023-08-27 DIAGNOSIS — R482 Apraxia: Secondary | ICD-10-CM

## 2023-08-27 NOTE — Therapy (Signed)
 OUTPATIENT SPEECH LANGUAGE PATHOLOGY TREATMENT NOTE   Patient Name: Alan Stewart MRN: [id number] DOB:08-04-1946, 78 y.o., male 8 Date: 08/27/2023  PCP: Ophelia Sage, MD REFERRING PROVIDER: Ophelia Sage, MD   END OF SESSION  End of Session - 08/27/23 1228     Visit Number 146    Number of Visits 165    Date for SLP Re-Evaluation 09/05/23    Authorization Type [employer] HMO    Authorization Time Period 08/21/2023 thru 09/22/2023    Authorization - Visit Number 1    Authorization - Number of Visits 10    Progress Note Due on Visit 150    SLP Start Time 1015    SLP Stop Time  1100    SLP Time Calculation (min) 45 min    Activity Tolerance Patient tolerated treatment well                   Past Medical History:  Diagnosis Date   CAD (coronary artery disease)    a. 10/2021 NSTEMI/PCI: LM nl, LAD min irregs, LCX 14m (2.5x18 Onyx Frontier DES), OM1 nl, RCA 7m (3.5x26 Onyx Frontier DES), 30d.   Diastolic dysfunction    a. 10/2021 Echo: EF 50-55%, no rwma, Gr1 DD, nl RV fxn. No significant valvular dzs.   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 SCCis. MOHs 07/08/22   Past Surgical History:  Procedure Laterality Date   COLONOSCOPY N/A 10/09/2015   Procedure: COLONOSCOPY;  Surgeon: Deward CINDERELLA Piedmont, MD;  Location: [facility] ENDOSCOPY;  Service: Gastroenterology;  Laterality: N/A;   CORONARY STENT INTERVENTION N/A 10/22/2021   Procedure: CORONARY STENT INTERVENTION;  Surgeon: Darron Deatrice LABOR, MD;  Location: [facility] INVASIVE CV LAB;  Service: Cardiovascular;  Laterality: N/A;   IR CT HEAD LTD  05/21/2022   IR PERCUTANEOUS ART THROMBECTOMY/INFUSION INTRACRANIAL INC DIAG ANGIO  05/21/2022   LEFT HEART CATH AND CORONARY ANGIOGRAPHY N/A 10/22/2021   Procedure: LEFT HEART CATH  AND CORONARY ANGIOGRAPHY;  Surgeon: Darron Deatrice LABOR, MD;  Location: [facility] INVASIVE CV LAB;  Service: Cardiovascular;  Laterality: N/A;   LOOP RECORDER INSERTION N/A 05/23/2022   Procedure: LOOP RECORDER INSERTION;  Surgeon: Cindie Ole DASEN, MD;  Location: [facility] INVASIVE CV LAB;  Service: Cardiovascular;  Laterality: N/A;   RADIOLOGY WITH ANESTHESIA N/A 05/20/2022   Procedure: IR WITH ANESTHESIA;  Surgeon: Dolphus Carrion, MD;  Location: [facility] OR;  Service: Radiology;  Laterality: N/A;   ROTATOR CUFF REPAIR     Patient Active Problem List   Diagnosis Date Noted   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 male with PMH: HTN, CAD, HLD, osteoarthritis, SCC, sleep apnea. He presented to the hospital on 05/20/22 with right sided weakness and aphasia with LKW reported by wife to be 10pm on 10/2.  He arrived  at [facility] as code stroke and taken for emergent CT/CTA which demonstrated left M1 occlusion. He received IV tenecteplase  and IR thrombectomy due to large vessel occlusion.       DIAGNOSTIC FINDINGS:  MRI 05/22/2022 Cortical ischemia throughout the left MCA territory compatible with recent infarct. Relatively little affect on the white matter. 2. Small focus of acute hemorrhage along the anterior surface of the left temporal lobe Heidelberg classification 3c: Subarachnoid hemorrhage. 3. Normal intracranial MRA.  Restored flow in left MCA.    THERAPY DIAG:  Aphasia  Apraxia  Middle cerebral artery embolism, left  Rationale for Evaluation  and Treatment Rehabilitation  SUBJECTIVE:   PAIN:  Are you having pain? No  PATIENT GOALS: to be able to communicate again  SUBJECTIVE STATEMENT: He has been typing more words Pt accompanied by: self and significant other  OBJECTIVE:   TODAY'S TREATMENT: Skilled treatment session focused on pt's communication goals. SLP facilitated session by providing the following interventions:  Pt pulled out his phone upon arriving to session with Text to Speech app pulled up. Pt with improved attempts to use app for communication. Pt requiring maximal multimodal cues to enter additional information during conversation with his wife about meals they had during the previous week. He continued to select the same words spaghetti, hamburger and then repeatedly stated you know, the small thing, you know. Pt reiant on his wife to continually guess despite cues to add more information. Pt frustrated with his wife with no insight into decreased ability to communicate or add more words/description.   Education details: see above Person educated: Patient and Wife Education method: Explanation, Facilities Manager, Verbal cues, and Handouts Education comprehension: verbalized understanding  HOME EXERCISE PROGRAM: Practice functional phrases contained in Text to Speech App Word lists  GOALS: Goals reviewed with patient? Yes  SHORT TERM GOALS: Target date: 10 sessions   UPDATED: 07/21/2023 3.   Pt will improve wording finding abilities by tying phrase level utterance into Text to Speech App with maximal faded to moderate cues.   5. Pt will improve spelling ability by accurately spelling SEMI-COMPLEX words during communication exchanges to increase listener understanding with min A in 6 out of 8 opportunities;  6.  Pt will read self-generated words during communication exchanges with > 75% speech intelligibility given Min A articulatory cues  7.  With multimodal cues from caregiver, pt will respond to  basic simple questions in 5 out of 7 opportunities using multimodal forms of communication based on caregiver report.   LONG TERM GOALS: Target date: 09/04/2022     Pt will use multimodal communication to express basic wants/needs in 7 of 10 opportunities.  Baseline: unable Goal status: ONGOING;  2.  Pt will initiate communication in 3 of 5 opportunities as reported by caregiver outside of ST sessions.   Baseline: no initiation  Goal Status: Initial: ONGOING; his wife reports mild increase in attempts - progress made    ASSESSMENT:  CLINICAL IMPRESSION: Pt is a 78 year old male who was seen today for therapy targeting his aphasia. Pt with improved communication using text to speech app.  Pt continues to demonstrate improved speech intelligibility and use of accurate motor speech patterns.   See the above detailed treatment note.    OBJECTIVE IMPAIRMENTS include expressive language, receptive language, aphasia, and apraxia. These impairments are limiting patient from managing medications, managing appointments, managing finances, household responsibilities, ADLs/IADLs, and effectively communicating at home and in community. Factors affecting potential to achieve goals and  functional outcome are severity of impairments. Patient will benefit from skilled SLP services to address above impairments and improve overall function.   REHAB POTENTIAL: Excellent  PLAN: SLP FREQUENCY: 3x/week  SLP DURATION: 12 weeks  PLANNED INTERVENTIONS: Language facilitation, Environmental controls, Internal/external aids, Functional tasks, Multimodal communication approach, SLP instruction and feedback, Compensatory strategies, and Patient/family education    Felicidad Sugarman B. Rubbie, M.S., CCC-SLP, Tree Surgeon Certified Brain Injury Specialist [facility]  [facility] Rehabilitation Services Office [phone] Ascom [phone] Fax [phone]

## 2023-08-29 ENCOUNTER — Encounter: Payer: Medicare HMO | Admitting: Speech Pathology

## 2023-09-01 ENCOUNTER — Ambulatory Visit: Payer: Medicare HMO | Admitting: Speech Pathology

## 2023-09-01 DIAGNOSIS — I6602 Occlusion and stenosis of left middle cerebral artery: Secondary | ICD-10-CM

## 2023-09-01 DIAGNOSIS — R482 Apraxia: Secondary | ICD-10-CM

## 2023-09-01 DIAGNOSIS — R4701 Aphasia: Secondary | ICD-10-CM | POA: Diagnosis not present

## 2023-09-01 NOTE — Therapy (Signed)
 OUTPATIENT SPEECH LANGUAGE PATHOLOGY TREATMENT NOTE   Patient Name: Alan Stewart MRN: [id number] DOB:06-29-1946, 78 y.o., male 108 Date: 09/01/2023  PCP: Ophelia Sage, MD REFERRING PROVIDER: Ophelia Sage, MD   END OF SESSION  End of Session - 09/01/23 1540     Visit Number 147    Number of Visits 165    Date for SLP Re-Evaluation 09/05/23    Authorization Type [employer] H[employer]    Authorization Time Period 08/21/2023 thru 09/22/2023    Authorization - Visit Number 2    Authorization - Number of Visits 10    Progress Note Due on Visit 150    SLP Start Time 1100    SLP Stop Time  1145    SLP Time Calculation (min) 45 min    Activity Tolerance Patient tolerated treatment well                   Past Medical History:  Diagnosis Date   CAD (coronary artery disease)    a. 10/2021 NSTEMI/PCI: LM nl, LAD min irregs, LCX 4m (2.5x18 Onyx Frontier DES), OM1 nl, RCA 26m (3.5x26 Onyx Frontier DES), 30d.   Diastolic dysfunction    a. 10/2021 Echo: EF 50-55%, no rwma, Gr1 DD, nl RV fxn. No significant valvular dzs.   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 SCCis. MOHs 07/08/22   Past Surgical History:  Procedure Laterality Date   COLONOSCOPY N/A 10/09/2015   Procedure: COLONOSCOPY;  Surgeon: Deward CINDERELLA Piedmont, MD;  Location: [facility] ENDOSCOPY;  Service: Gastroenterology;  Laterality: N/A;   CORONARY STENT INTERVENTION N/A 10/22/2021   Procedure: CORONARY STENT INTERVENTION;  Surgeon: Darron Deatrice LABOR, MD;  Location: [facility] INVASIVE CV LAB;  Service: Cardiovascular;  Laterality: N/A;   IR CT HEAD LTD  05/21/2022   IR PERCUTANEOUS ART THROMBECTOMY/INFUSION INTRACRANIAL INC DIAG ANGIO  05/21/2022   LEFT HEART CATH AND CORONARY ANGIOGRAPHY N/A 10/22/2021   Procedure: LEFT HEART  CATH AND CORONARY ANGIOGRAPHY;  Surgeon: Darron Deatrice LABOR, MD;  Location: [facility] INVASIVE CV LAB;  Service: Cardiovascular;  Laterality: N/A;   LOOP RECORDER INSERTION N/A 05/23/2022   Procedure: LOOP RECORDER INSERTION;  Surgeon: Cindie Ole DASEN, MD;  Location: [facility] INVASIVE CV LAB;  Service: Cardiovascular;  Laterality: N/A;   RADIOLOGY WITH ANESTHESIA N/A 05/20/2022   Procedure: IR WITH ANESTHESIA;  Surgeon: Dolphus Carrion, MD;  Location: [facility] OR;  Service: Radiology;  Laterality: N/A;   ROTATOR CUFF REPAIR     Patient Active Problem List   Diagnosis Date Noted   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 male with PMH: HTN, CAD, HLD, osteoarthritis, SCC, sleep apnea. He presented to the hospital on 05/20/22 with right sided weakness and aphasia with LKW reported by wife to be 10pm on 10/2.  He arrived  at [facility] as code stroke and taken for emergent CT/CTA which demonstrated left M1 occlusion. He received IV tenecteplase  and IR thrombectomy due to large vessel occlusion.       DIAGNOSTIC FINDINGS:  MRI 05/22/2022 Cortical ischemia throughout the left MCA territory compatible with recent infarct. Relatively little affect on the white matter. 2. Small focus of acute hemorrhage along the anterior surface of the left temporal lobe Heidelberg classification 3c: Subarachnoid hemorrhage. 3. Normal intracranial MRA.  Restored flow in left MCA.    THERAPY DIAG:  Aphasia  Apraxia  Middle cerebral artery embolism, left  Rationale for  Evaluation and Treatment Rehabilitation  SUBJECTIVE:   PAIN:  Are you having pain? No  PATIENT GOALS: to be able to communicate again  SUBJECTIVE STATEMENT: His wife reported he is using that more, putting more words in referring to Text to Speech App Pt accompanied by: self and significant other  OBJECTIVE:   TODAY'S TREATMENT: Skilled treatment session focused on pt's communication goals. SLP facilitated session by providing the following interventions:  Pt arrived to session with more words programed into his Text to Speech app with his wife reporting increased use. During conversation based questions regarding variety of topics, pt used with improved ability and improved speech repetition of words.   Education details: see above Person educated: Patient and Wife Education method: Explanation, Facilities Manager, Verbal cues, and Handouts Education comprehension: verbalized understanding  HOME EXERCISE PROGRAM: Practice functional phrases contained in Text to Speech App Word lists  GOALS: Goals reviewed with patient? Yes  SHORT TERM GOALS: Target date: 10 sessions   UPDATED: 07/21/2023 3.   Pt will improve wording finding abilities by tying phrase level utterance into Text to Speech App with maximal faded to moderate cues.   5. Pt will improve spelling ability by accurately spelling SEMI-COMPLEX words during communication exchanges to increase listener understanding with min A in 6 out of 8 opportunities;  6.  Pt will read self-generated words during communication exchanges with > 75% speech intelligibility given Min A articulatory cues  7.  With multimodal cues from caregiver, pt will respond to basic simple questions in 5 out of 7 opportunities using multimodal forms of communication based on caregiver report.   LONG TERM GOALS: Target date: 09/04/2022     Pt will use multimodal communication to express basic wants/needs in 7 of 10 opportunities.  Baseline: unable Goal  status: ONGOING;  2.  Pt will initiate communication in 3 of 5 opportunities as reported by caregiver outside of ST sessions.   Baseline: no initiation  Goal Status: Initial: ONGOING; his wife reports mild increase in attempts - progress made    ASSESSMENT:  CLINICAL IMPRESSION: Pt is a 78 year old male who was seen today for therapy targeting his aphasia. Pt with improved communication using text to speech app.  Pt continues to demonstrate improved speech intelligibility and use of accurate motor speech patterns.   See the above detailed treatment note.    OBJECTIVE IMPAIRMENTS include expressive language, receptive language, aphasia, and apraxia. These impairments are limiting patient from managing medications, managing appointments, managing finances, household responsibilities, ADLs/IADLs, and effectively communicating at home and in community. Factors affecting potential to achieve goals and functional outcome are severity of impairments. Patient will benefit from skilled SLP services to address above impairments and improve overall function.   REHAB POTENTIAL: Excellent  PLAN: SLP FREQUENCY: 3x/week  SLP DURATION: 12 weeks  PLANNED INTERVENTIONS: Language facilitation, Environmental controls, Internal/external aids, Functional tasks,  Multimodal communication approach, SLP instruction and feedback, Compensatory strategies, and Patient/family education    Nyiah Pianka B. Rubbie, M.S., CCC-SLP, Tree Surgeon Certified Brain Injury Specialist [facility]  [facility] Rehabilitation Services Office [phone] Ascom [phone] Fax [phone]

## 2023-09-03 ENCOUNTER — Ambulatory Visit: Payer: Medicare HMO | Admitting: Speech Pathology

## 2023-09-04 ENCOUNTER — Ambulatory Visit (INDEPENDENT_AMBULATORY_CARE_PROVIDER_SITE_OTHER): Payer: Medicare HMO

## 2023-09-04 ENCOUNTER — Ambulatory Visit: Payer: Medicare HMO | Admitting: Speech Pathology

## 2023-09-04 DIAGNOSIS — R4701 Aphasia: Secondary | ICD-10-CM | POA: Diagnosis not present

## 2023-09-04 DIAGNOSIS — R482 Apraxia: Secondary | ICD-10-CM | POA: Diagnosis not present

## 2023-09-04 DIAGNOSIS — I6602 Occlusion and stenosis of left middle cerebral artery: Secondary | ICD-10-CM

## 2023-09-04 DIAGNOSIS — I63412 Cerebral infarction due to embolism of left middle cerebral artery: Secondary | ICD-10-CM | POA: Diagnosis not present

## 2023-09-04 LAB — CUP PACEART REMOTE DEVICE CHECK
Date Time Interrogation Session: 20250116081704
Implantable Pulse Generator Implant Date: 20231005
Pulse Gen Model: 450218
Pulse Gen Serial Number: 95054077

## 2023-09-04 NOTE — Therapy (Signed)
OUTPATIENT SPEECH LANGUAGE PATHOLOGY TREATMENT NOTE   Patient Name: Alan Stewart MRN: [id number] DOB:13-Oct-1945, 78 y.o., male 6 Date: 09/04/2023  PCP: Daniel Nones, MD REFERRING PROVIDER: Daniel Nones, MD   END OF SESSION  End of Session - 09/04/23 1042     Visit Number 148    Number of Visits 165    Date for SLP Re-Evaluation 09/05/23    Authorization Type [employer] H[employer]    Authorization Time Period 08/21/2023 thru 09/22/2023    Authorization - Visit Number 3    Authorization - Number of Visits 10    Progress Note Due on Visit 150    SLP Start Time 1015    SLP Stop Time  1100    SLP Time Calculation (min) 45 min    Activity Tolerance Patient tolerated treatment well                   Past Medical History:  Diagnosis Date   CAD (coronary artery disease)    a. 10/2021 NSTEMI/PCI: LM nl, LAD min irregs, LCX 46m (2.5x18 Onyx Frontier DES), OM1 nl, RCA 37m (3.5x26 Onyx Frontier DES), 30d.   Diastolic dysfunction    a. 10/2021 Echo: EF 50-55%, no rwma, Gr1 DD, nl RV fxn. No significant valvular dzs.   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 SCCis. MOHs 07/08/22   Past Surgical History:  Procedure Laterality Date   COLONOSCOPY N/A 10/09/2015   Procedure: COLONOSCOPY;  Surgeon: Wallace Cullens, MD;  Location: [facility] ENDOSCOPY;  Service: Gastroenterology;  Laterality: N/A;   CORONARY STENT INTERVENTION N/A 10/22/2021   Procedure: CORONARY STENT INTERVENTION;  Surgeon: Iran Ouch, MD;  Location: [facility] INVASIVE CV LAB;  Service: Cardiovascular;  Laterality: N/A;   IR CT HEAD LTD  05/21/2022   IR PERCUTANEOUS ART THROMBECTOMY/INFUSION INTRACRANIAL INC DIAG ANGIO  05/21/2022   LEFT HEART CATH AND CORONARY ANGIOGRAPHY N/A 10/22/2021   Procedure: LEFT HEART  CATH AND CORONARY ANGIOGRAPHY;  Surgeon: Iran Ouch, MD;  Location: [facility] INVASIVE CV LAB;  Service: Cardiovascular;  Laterality: N/A;   LOOP RECORDER INSERTION N/A 05/23/2022   Procedure: LOOP RECORDER INSERTION;  Surgeon: Lanier Prude, MD;  Location: [facility] INVASIVE CV LAB;  Service: Cardiovascular;  Laterality: N/A;   RADIOLOGY WITH ANESTHESIA N/A 05/20/2022   Procedure: IR WITH ANESTHESIA;  Surgeon: Julieanne Cotton, MD;  Location: [facility] OR;  Service: Radiology;  Laterality: N/A;   ROTATOR CUFF REPAIR     Patient Active Problem List   Diagnosis Date Noted   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 male with PMH: HTN, CAD, HLD, osteoarthritis, SCC, sleep apnea. He presented to the hospital on 05/20/22 with right sided weakness and aphasia with LKW reported by wife to be 10pm on 10/2.  He arrived  at [facility] as code stroke and taken for emergent CT/CTA which demonstrated left M1 occlusion. He received IV tenecteplase and IR thrombectomy due to large vessel occlusion.       DIAGNOSTIC FINDINGS:  MRI 05/22/2022 Cortical ischemia throughout the left MCA territory compatible with recent infarct. Relatively little affect on the white matter. 2. Small focus of acute hemorrhage along the anterior surface of the left temporal lobe Heidelberg classification 3c: Subarachnoid hemorrhage. 3. Normal intracranial MRA.  Restored flow in left MCA.    THERAPY DIAG:  Aphasia  Apraxia  Middle cerebral artery embolism, left  Rationale for  Evaluation and Treatment Rehabilitation  SUBJECTIVE:   PAIN:  Are you having pain? No  PATIENT GOALS: to be able to communicate again  SUBJECTIVE STATEMENT: "I don't think that he is feeling very good" pt appeared  Pt accompanied by: self and significant other  OBJECTIVE:   TODAY'S TREATMENT: Skilled treatment session focused on pt's communication goals. SLP facilitated session by providing the following interventions:  Pt continues to arrive to session with more words programed into his Text to Speech app with his wife reporting increased use. During conversation based questions regarding variety of topics, pt used with improved ability and improved speech repetition of words.   Education details: see above Person educated: Patient and Wife Education method: Explanation, Facilities manager, Verbal cues, and Handouts Education comprehension: verbalized understanding  HOME EXERCISE PROGRAM: Practice functional phrases contained in Text to Speech App Word lists  GOALS: Goals reviewed with patient? Yes  SHORT TERM GOALS: Target date: 10 sessions   UPDATED: 07/21/2023 3.   Pt will improve wording finding abilities by tying phrase level utterance into Text to Speech App with maximal faded to moderate cues.   5. Pt will improve spelling ability by accurately spelling SEMI-COMPLEX words during communication exchanges to increase listener understanding with min A in 6 out of 8 opportunities;  6.  Pt will read self-generated words during communication exchanges with > 75% speech intelligibility given Min A articulatory cues  7.  With multimodal cues from caregiver, pt will respond to basic simple questions in 5 out of 7 opportunities using multimodal forms of communication based on caregiver report.   LONG TERM GOALS: Target date: 09/04/2022     Pt will use multimodal communication to express basic wants/needs in 7 of 10 opportunities.  Baseline: unable Goal status: ONGOING;  2.  Pt  will initiate communication in 3 of 5 opportunities as reported by caregiver outside of ST sessions.   Baseline: no initiation  Goal Status: Initial: ONGOING; his wife reports mild increase in attempts - progress made    ASSESSMENT:  CLINICAL IMPRESSION: Pt is a 78 year old male who was seen today for therapy targeting his aphasia. Pt with improved communication using text to speech app.  Pt continues to demonstrate improved speech intelligibility and use of accurate motor speech patterns.   See the above detailed treatment note.    OBJECTIVE IMPAIRMENTS include expressive language, receptive language, aphasia, and apraxia. These impairments are limiting patient from managing medications, managing appointments, managing finances, household responsibilities, ADLs/IADLs, and effectively communicating at home and in community. Factors affecting potential to achieve goals and functional outcome are severity of impairments. Patient will benefit from skilled SLP services to address above impairments and improve overall function.   REHAB POTENTIAL: Excellent  PLAN: SLP FREQUENCY: 3x/week  SLP DURATION: 12 weeks  PLANNED INTERVENTIONS: Language facilitation, Environmental controls, Internal/external aids, Functional tasks, Multimodal communication approach, SLP  instruction and feedback, Compensatory strategies, and Patient/family education    Corda Shutt B. Dreama Saa, M.S., CCC-SLP, Tree surgeon Certified Brain Injury Specialist [facility]  [facility] Rehabilitation Services Office [phone] Ascom [phone] Fax [phone]

## 2023-09-05 ENCOUNTER — Ambulatory Visit: Payer: Medicare HMO | Admitting: Speech Pathology

## 2023-09-07 ENCOUNTER — Encounter: Payer: Self-pay | Admitting: Cardiology

## 2023-09-08 ENCOUNTER — Ambulatory Visit: Payer: Medicare HMO | Admitting: Speech Pathology

## 2023-09-10 ENCOUNTER — Ambulatory Visit: Payer: Medicare HMO | Admitting: Speech Pathology

## 2023-09-12 ENCOUNTER — Ambulatory Visit: Payer: Medicare HMO | Admitting: Speech Pathology

## 2023-09-15 ENCOUNTER — Ambulatory Visit: Payer: Medicare HMO | Admitting: Speech Pathology

## 2023-09-15 DIAGNOSIS — R4701 Aphasia: Secondary | ICD-10-CM | POA: Diagnosis not present

## 2023-09-15 DIAGNOSIS — R482 Apraxia: Secondary | ICD-10-CM | POA: Diagnosis not present

## 2023-09-15 DIAGNOSIS — I6602 Occlusion and stenosis of left middle cerebral artery: Secondary | ICD-10-CM

## 2023-09-15 NOTE — Therapy (Signed)
OUTPATIENT SPEECH LANGUAGE PATHOLOGY TREATMENT NOTE Re-CERTIFICATION Request   Patient Name: CAMERYN CHRISLEY MRN: [id number] DOB:07/06/1946, 78 y.o., male 29 Date: 09/15/2023  PCP: Daniel Nones, MD REFERRING PROVIDER: Daniel Nones, MD   END OF SESSION  End of Session - 09/15/23 1016     Visit Number 149    Number of Visits 155    Date for SLP Re-Evaluation 10/27/23    Authorization Type [employer] H[employer]    Authorization Time Period 08/21/2023 thru 09/22/2023    Authorization - Visit Number 4    Authorization - Number of Visits 10    Progress Note Due on Visit 150    SLP Start Time 1015    SLP Stop Time  1100    SLP Time Calculation (min) 45 min    Activity Tolerance Patient tolerated treatment well                   Past Medical History:  Diagnosis Date   CAD (coronary artery disease)    a. 10/2021 NSTEMI/PCI: LM nl, LAD min irregs, LCX 48m (2.5x18 Onyx Frontier DES), OM1 nl, RCA 33m (3.5x26 Onyx Frontier DES), 30d.   Diastolic dysfunction    a. 10/2021 Echo: EF 50-55%, no rwma, Gr1 DD, nl RV fxn. No significant valvular dzs.   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 SCCis. MOHs 07/08/22   Past Surgical History:  Procedure Laterality Date   COLONOSCOPY N/A 10/09/2015   Procedure: COLONOSCOPY;  Surgeon: Wallace Cullens, MD;  Location: [facility] ENDOSCOPY;  Service: Gastroenterology;  Laterality: N/A;   CORONARY STENT INTERVENTION N/A 10/22/2021   Procedure: CORONARY STENT INTERVENTION;  Surgeon: Iran Ouch, MD;  Location: [facility] INVASIVE CV LAB;  Service: Cardiovascular;  Laterality: N/A;   IR CT HEAD LTD  05/21/2022   IR PERCUTANEOUS ART THROMBECTOMY/INFUSION INTRACRANIAL INC DIAG ANGIO  05/21/2022   LEFT HEART CATH AND CORONARY ANGIOGRAPHY N/A 10/22/2021    Procedure: LEFT HEART CATH AND CORONARY ANGIOGRAPHY;  Surgeon: Iran Ouch, MD;  Location: [facility] INVASIVE CV LAB;  Service: Cardiovascular;  Laterality: N/A;   LOOP RECORDER INSERTION N/A 05/23/2022   Procedure: LOOP RECORDER INSERTION;  Surgeon: Lanier Prude, MD;  Location: [facility] INVASIVE CV LAB;  Service: Cardiovascular;  Laterality: N/A;   RADIOLOGY WITH ANESTHESIA N/A 05/20/2022   Procedure: IR WITH ANESTHESIA;  Surgeon: Julieanne Cotton, MD;  Location: [facility] OR;  Service: Radiology;  Laterality: N/A;   ROTATOR CUFF REPAIR     Patient Active Problem List   Diagnosis Date Noted   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 male with PMH: HTN, CAD, HLD, osteoarthritis, SCC, sleep apnea. He presented to the hospital on 05/20/22 with right sided weakness and aphasia with LKW reported by wife to be 10pm on 10/2.  He arrived at [facility] as code stroke and taken for emergent CT/CTA which demonstrated left M1 occlusion. He received IV tenecteplase and IR thrombectomy due to large vessel occlusion.       DIAGNOSTIC FINDINGS:  MRI 05/22/2022 Cortical ischemia throughout the left MCA territory compatible with recent infarct. Relatively little affect on the white matter. 2. Small focus of acute hemorrhage along the anterior surface of the left temporal lobe Heidelberg classification 3c: Subarachnoid hemorrhage. 3. Normal intracranial MRA.  Restored flow in left MCA.    THERAPY DIAG:  Aphasia  Apraxia  Middle cerebral artery embolism,  left  Rationale for Evaluation and Treatment Rehabilitation  SUBJECTIVE:   PAIN:  Are you having pain? No  PATIENT GOALS: to be able to communicate again  SUBJECTIVE STATEMENT: Pt missed 1 week of sessions d/t winter weather Pt accompanied by: self and significant other  OBJECTIVE:   TODAY'S TREATMENT: Skilled treatment session focused on pt's communication goals. SLP facilitated session by providing the following interventions:  Pt and his wife actively worked together for successful increase in language output during today's session. Pt's wife independently provided semantic cues as well as phonemic cues with pt utilizing verbal and Text to Speech App for communication. Much improvement noted.   Education details: see above Person educated: Patient and Wife Education method: Explanation, Facilities manager, Verbal cues, and Handouts Education comprehension: verbalized understanding  HOME EXERCISE PROGRAM: Practice functional phrases contained in Text to Speech App Word lists  GOALS: Goals reviewed with patient? Yes  SHORT TERM GOALS: Target date: 10 sessions   UPDATED: 07/21/2023   UPDATED: 09/15/2023 3.   Pt will improve wording finding abilities by tying phrase level utterance into Text to Speech App with maximal faded to moderate cues.  - progress made 5. Pt will improve spelling ability by accurately spelling SEMI-COMPLEX words during communication exchanges to increase listener understanding with min A in 6 out of 8 opportunities; - progress made 6.  Pt will read self-generated words during communication exchanges with > 75% speech intelligibility given Min A articulatory cues - progress made 7.  With multimodal cues from caregiver, pt will respond to basic simple questions in 5 out of 7 opportunities using multimodal forms of communication based on caregiver report. - progress made  LONG TERM GOALS: Target date: 10/27/2023    UPDATED 09/15/2023  Pt will use multimodal  communication to express basic wants/needs in 7 of 10 opportunities.  Baseline: unable Goal status: ONGOING; progress made 2.  Pt will initiate communication in 3 of 5 opportunities as reported by caregiver outside of ST sessions.   Baseline: no initiation  Goal Status: Initial: ONGOING; his wife reports mild increase in attempts - progress made    ASSESSMENT:  CLINICAL IMPRESSION: Pt is a 78 year old male who was seen today for therapy targeting his aphasia. Pt with improved communication using text to speech app.  Pt continues to demonstrate improved speech intelligibility and use of accurate motor speech patterns.   As pt nears his likely functional baseline, will plan to seek re-certification for services 1xweek See the above detailed treatment note.    OBJECTIVE IMPAIRMENTS include expressive language, receptive language, aphasia, and apraxia. These impairments are limiting patient from managing medications, managing appointments, managing finances, household responsibilities, ADLs/IADLs, and effectively communicating at home and in community. Factors affecting potential to achieve goals and functional outcome are severity of impairments. Patient will benefit from skilled SLP services to address above impairments and improve overall function.  REHAB POTENTIAL: Excellent  PLAN: SLP FREQUENCY: 1x/week  SLP DURATION: 12 weeks  PLANNED INTERVENTIONS: Language facilitation, Environmental controls, Internal/external aids, Functional tasks, Multimodal communication approach, SLP instruction and feedback, Compensatory strategies, and Patient/family education    Dalaya Suppa B. Dreama Saa, M.S., CCC-SLP, Tree surgeon Certified Brain Injury Specialist [facility]  [facility] Rehabilitation Services Office [phone] Ascom [phone] Fax [phone]

## 2023-09-17 ENCOUNTER — Ambulatory Visit: Payer: Medicare HMO | Admitting: Speech Pathology

## 2023-09-18 ENCOUNTER — Ambulatory Visit: Payer: Medicare HMO | Admitting: Speech Pathology

## 2023-09-19 ENCOUNTER — Ambulatory Visit: Payer: Medicare HMO | Admitting: Speech Pathology

## 2023-09-22 ENCOUNTER — Ambulatory Visit: Payer: Medicare HMO | Attending: Internal Medicine | Admitting: Speech Pathology

## 2023-09-22 DIAGNOSIS — I6602 Occlusion and stenosis of left middle cerebral artery: Secondary | ICD-10-CM | POA: Diagnosis not present

## 2023-09-22 DIAGNOSIS — R482 Apraxia: Secondary | ICD-10-CM | POA: Diagnosis not present

## 2023-09-22 DIAGNOSIS — R4701 Aphasia: Secondary | ICD-10-CM

## 2023-09-22 NOTE — Therapy (Signed)
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 Patient is making progress toward LTGs and STGs this reporting period. See skilled intervention, clinical impressions, and goals below for details.  END OF SESSION  End of Session - 09/22/23 1013     Visit Number 150    Number of Visits 155    Date for SLP Re-Evaluation 10/27/23    Authorization Type [employer] H[employer]    Authorization Time Period 08/21/2023 thru 09/22/2023    Authorization - Visit Number 5    Authorization - Number of Visits 10    Progress Note Due on Visit 150    SLP Start Time 1015    SLP Stop Time  1100    SLP Time Calculation (min) 45 min    Activity Tolerance Patient tolerated treatment well             Past Medical History:  Diagnosis Date   CAD (coronary artery disease)    a. 10/2021 NSTEMI/PCI: LM nl, LAD min irregs, LCX 20m (2.5x18 Onyx Frontier DES), OM1 nl, RCA 38m (3.5x26 Onyx Frontier DES), 30d.   Diastolic dysfunction    a. 10/2021 Echo: EF 50-55%, no rwma, Gr1 DD, nl RV fxn. No significant valvular dzs.   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 SCCis. MOHs 07/08/22   Past Surgical History:  Procedure Laterality Date   COLONOSCOPY N/A 10/09/2015   Procedure: COLONOSCOPY;  Surgeon: Wallace Cullens, MD;  Location: [facility] ENDOSCOPY;  Service: Gastroenterology;  Laterality: N/A;    CORONARY STENT INTERVENTION N/A 10/22/2021   Procedure: CORONARY STENT INTERVENTION;  Surgeon: Iran Ouch, MD;  Location: [facility] INVASIVE CV LAB;  Service: Cardiovascular;  Laterality: N/A;   IR CT HEAD LTD  05/21/2022   IR PERCUTANEOUS ART THROMBECTOMY/INFUSION INTRACRANIAL INC DIAG ANGIO  05/21/2022   LEFT HEART CATH AND CORONARY ANGIOGRAPHY N/A 10/22/2021   Procedure: LEFT HEART CATH AND CORONARY ANGIOGRAPHY;  Surgeon: Iran Ouch, MD;  Location: [facility] INVASIVE CV LAB;  Service: Cardiovascular;  Laterality: N/A;   LOOP RECORDER INSERTION N/A 05/23/2022   Procedure: LOOP RECORDER INSERTION;  Surgeon: Lanier Prude, MD;  Location: [facility] INVASIVE CV LAB;  Service: Cardiovascular;  Laterality: N/A;   RADIOLOGY WITH ANESTHESIA N/A 05/20/2022   Procedure: IR WITH ANESTHESIA;  Surgeon: Julieanne Cotton, MD;  Location: [facility] OR;  Service: Radiology;  Laterality: N/A;   ROTATOR CUFF REPAIR     Patient Active Problem List   Diagnosis Date Noted   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 male with PMH: HTN, CAD, HLD, osteoarthritis, SCC, sleep apnea. He presented to the hospital on 05/20/22 with right sided weakness and aphasia with LKW reported by wife to be 10pm on 10/2.  He arrived at [facility] as code stroke and taken for emergent CT/CTA which demonstrated left M1 occlusion. He received IV tenecteplase and IR thrombectomy due to large vessel occlusion.       DIAGNOSTIC FINDINGS:  MRI 05/22/2022 Cortical ischemia throughout the left MCA  territory compatible with recent infarct. Relatively little affect on the white matter. 2. Small focus of acute hemorrhage along the anterior surface of the left temporal lobe Heidelberg classification 3c: Subarachnoid hemorrhage. 3. Normal intracranial MRA.  Restored flow in left MCA.    THERAPY DIAG:  Aphasia  Apraxia  Middle cerebral artery embolism, left  Rationale for Evaluation and Treatment Rehabilitation  SUBJECTIVE:   PAIN:  Are you having pain? No  PATIENT GOALS: to be able to communicate again  SUBJECTIVE STATEMENT: "Pt is talking in more sentences" Pt accompanied by: self and significant other  OBJECTIVE:   TODAY'S TREATMENT: Skilled treatment session focused on pt's communication goals. SLP facilitated session by providing the following interventions:  Pt and his wife actively worked together for successful increase in language output during today's session. Pt's wife independently provided semantic cues as well as phonemic cues with pt utilizing verbal and Text to Speech App for communication. Much improvement noted with pt able to participate in conversational turn taking.   Education details: see above Person educated: Patient and Wife Education method: Explanation, Facilities manager, Verbal cues, and Handouts Education comprehension: verbalized understanding  HOME EXERCISE PROGRAM: Practice functional phrases contained in Text to Speech App Word lists  GOALS: Goals reviewed with patient? Yes  SHORT TERM GOALS: Target date: 10 sessions   UPDATED: 07/21/2023   UPDATED: 09/15/2023 3.   Pt will improve wording finding abilities by tying phrase level utterance into Text to Speech App with maximal faded to moderate cues.  - progress made 5. Pt will improve spelling ability by accurately spelling SEMI-COMPLEX words during communication exchanges to increase listener understanding with min A in 6 out of 8 opportunities; - progress made 6.  Pt will read  self-generated words during communication exchanges with > 75% speech intelligibility given Min A articulatory cues - progress made 7.  With multimodal cues from caregiver, pt will respond to basic simple questions in 5 out of 7 opportunities using multimodal forms of communication based on caregiver report. - progress made  LONG TERM GOALS: Target date: 10/27/2023    UPDATED 09/15/2023  Pt will use multimodal communication to express basic wants/needs in 7 of 10 opportunities.  Baseline: unable Goal status: ONGOING; progress made 2.  Pt will initiate communication in 3 of 5 opportunities as reported by caregiver outside of ST sessions.   Baseline: no initiation  Goal Status: Initial: ONGOING; his wife reports mild increase in attempts - progress made    ASSESSMENT:  CLINICAL IMPRESSION: Pt is a 78 year old male who was seen today for therapy targeting his aphasia. Pt with improved communication using text to speech app.  Pt continues to demonstrate improved speech intelligibility and use of accurate motor speech patterns.   As pt nears his likely functional baseline, will plan to seek re-certification for services 1xweek See the above detailed treatment note.    OBJECTIVE IMPAIRMENTS include  expressive language, receptive language, aphasia, and apraxia. These impairments are limiting patient from managing medications, managing appointments, managing finances, household responsibilities, ADLs/IADLs, and effectively communicating at home and in community. Factors affecting potential to achieve goals and functional outcome are severity of impairments. Patient will benefit from skilled SLP services to address above impairments and improve overall function.   REHAB POTENTIAL: Excellent  PLAN: SLP FREQUENCY: 1x/week  SLP DURATION: 12 weeks  PLANNED INTERVENTIONS: Language facilitation, Environmental controls, Internal/external aids, Functional tasks, Multimodal communication approach, SLP  instruction and feedback, Compensatory strategies, and Patient/family education    Cosme Jacob B. Dreama Saa, M.S., CCC-SLP, Tree surgeon Certified Brain Injury Specialist [facility]  [facility] Rehabilitation Services Office [phone] Ascom [phone] Fax [phone]

## 2023-09-23 ENCOUNTER — Ambulatory Visit: Payer: Medicare HMO | Attending: Cardiovascular Disease | Admitting: Cardiovascular Disease

## 2023-09-23 ENCOUNTER — Encounter: Payer: Self-pay | Admitting: Cardiovascular Disease

## 2023-09-23 VITALS — BP 136/60 | HR 50 | Ht 66.0 in | Wt 256.4 lb

## 2023-09-23 DIAGNOSIS — E785 Hyperlipidemia, unspecified: Secondary | ICD-10-CM | POA: Diagnosis not present

## 2023-09-23 DIAGNOSIS — I2510 Atherosclerotic heart disease of native coronary artery without angina pectoris: Secondary | ICD-10-CM | POA: Diagnosis not present

## 2023-09-23 DIAGNOSIS — I10 Essential (primary) hypertension: Secondary | ICD-10-CM

## 2023-09-23 MED ORDER — LOSARTAN POTASSIUM 50 MG PO TABS: 50.0000 mg | ORAL_TABLET | Freq: Every day | ORAL | 1 refills | Status: DC

## 2023-09-23 NOTE — Progress Notes (Signed)
 Cardiology Office Note   Date:  09/23/2023   ID:  Alan Stewart, DOB 15-Jan-1946, MRN [id number]  PCP:  [name], MD  Cardiologist:   Deatrice Cage, MD   Chief Complaint  Patient presents with   Follow-up    Patient denies new or acute cardiac problems/concerns today.        History of Present Illness: Alan Stewart is a 78 y.o. male who presents for a follow-up visit regarding coronary artery disease. He has known history of essential hypertension, hyperlipidemia, prediabetes, obesity and obstructive sleep apnea. He presented in March of 2023 with non-ST elevation myocardial infarction.  Cardiac catheterization showed severe mid left circumflex and mid RCA disease.  Both were treated with PCI and drug-eluting stent placement.  EF was 50 to 55%. He was diagnosed with obstructive sleep apnea but did not tolerate CPAP. He was hospitalized in October 2023 with left MCA infarct.  He was treated with TNK.  He had an echocardiogram during that admission that showed normal LV systolic function. He had a loop recorder placed after that and there has been no evidence of atrial fibrillation. He has been doing well from a cardiac standpoint with no chest pain or shortness of breath.  Past Medical History:  Diagnosis Date   CAD (coronary artery disease)    a. 10/2021 NSTEMI/PCI: LM nl, LAD min irregs, LCX 15m (2.5x18 Onyx Frontier DES), OM1 nl, RCA 58m (3.5x26 Onyx Frontier DES), 30d.   Diastolic dysfunction    a. 10/2021 Echo: EF 50-55%, no rwma, Gr1 DD, nl RV fxn. No significant valvular dzs.   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 SCCis. MOHs 07/08/22    Past Surgical History:  Procedure Laterality Date   COLONOSCOPY N/A 10/09/2015   Procedure: COLONOSCOPY;   Surgeon: Deward CINDERELLA Piedmont, MD;  Location: [facility] ENDOSCOPY;  Service: Gastroenterology;  Laterality: N/A;   CORONARY STENT INTERVENTION N/A 10/22/2021   Procedure: CORONARY STENT INTERVENTION;  Surgeon: Cage Deatrice LABOR, MD;  Location: [facility] INVASIVE CV LAB;  Service: Cardiovascular;  Laterality: N/A;   IR CT HEAD LTD  05/21/2022   IR PERCUTANEOUS ART THROMBECTOMY/INFUSION INTRACRANIAL INC DIAG ANGIO  05/21/2022   LEFT HEART CATH AND CORONARY ANGIOGRAPHY N/A 10/22/2021   Procedure: LEFT HEART CATH AND CORONARY ANGIOGRAPHY;  Surgeon: Cage Deatrice LABOR, MD;  Location: [facility] INVASIVE CV LAB;  Service: Cardiovascular;  Laterality: N/A;   LOOP RECORDER INSERTION N/A 05/23/2022   Procedure: LOOP RECORDER INSERTION;  Surgeon: Cindie Ole DASEN, MD;  Location: [facility] INVASIVE CV LAB;  Service: Cardiovascular;  Laterality: N/A;   RADIOLOGY WITH ANESTHESIA N/A 05/20/2022   Procedure: IR WITH ANESTHESIA;  Surgeon: Dolphus Carrion, MD;  Location: [facility] OR;  Service: Radiology;  Laterality: N/A;   ROTATOR CUFF REPAIR       Current Outpatient Medications  Medication Sig Dispense Refill   aspirin  EC 81 MG EC tablet Take 1 tablet (81 mg total) by mouth daily. Swallow whole. 30 tablet 11   atorvastatin  (LIPITOR ) 80 MG tablet Take 1 tablet (80 mg total) by mouth daily. 90 tablet 3   carvedilol  (COREG ) 6.25 MG tablet Take 1 tablet (6.25 mg total) by mouth 2 (two) times daily with a  meal. Overdue for follow up appointment.  Please call to schedule appointment prior to next refill. (First attempt) 60 tablet 0   ezetimibe  (ZETIA ) 10 MG tablet TAKE 1 TABLET BY MOUTH DAILY 90 tablet 0   nitroGLYCERIN  (NITROSTAT ) 0.4 MG SL tablet Place 1 tablet (0.4 mg total) under the tongue every 5 (five) minutes as needed for chest pain. 30 tablet 12   ticagrelor  (BRILINTA ) 90 MG TABS tablet TAKE 1 TABLET BY MOUTH TWICE DAILY 180 tablet 0   No current facility-administered medications for this visit.    Allergies:   Patient has no known allergies.     Social History:  The patient  reports that he quit smoking about 33 years ago. His smoking use included cigarettes. He started smoking about 43 years ago. He has a 15 pack-year smoking history. He has quit using smokeless tobacco. He reports that he does not currently use alcohol . He reports that he does not use drugs.   Family History:  The patient's family history includes Cancer in his sister.    ROS:  Please see the history of present illness.   Otherwise, review of systems are positive for none.   All other systems are reviewed and negative.    PHYSICAL EXAM: VS:  BP 136/60 (BP Location: Left Arm, Patient Position: Sitting, Cuff Size: Large)   Pulse (!) 50   Ht 5' 6 (1.676 m)   Wt 256 lb 6.4 oz (116.3 kg)   SpO2 98%   BMI 41.38 kg/m  , BMI Body mass index is 41.38 kg/m. GEN: Well nourished, well developed, in no acute distress  HEENT: normal  Neck: no JVD, carotid bruits, or masses Cardiac: RRR; no murmurs, rubs, or gallops,no edema  Respiratory:  clear to auscultation bilaterally, normal work of breathing GI: soft, nontender, nondistended, + BS MS: no deformity or atrophy  Skin: warm and dry, no rash Neuro:  Strength and sensation are intact Psych: euthymic mood, full affect   EKG:  EKG ordered today. EKG showed: Sinus bradycardia Nonspecific ST and T wave abnormality    Recent Labs: No results found for requested labs within last 365 days.    Lipid Panel    Component Value Date/Time   CHOL 106 05/21/2022 0433   TRIG 83 05/21/2022 0433   HDL 33 (L) 05/21/2022 0433   CHOLHDL 3.2 05/21/2022 0433   VLDL 17 05/21/2022 0433   LDLCALC 56 05/21/2022 0433      Wt Readings from Last 3 Encounters:  09/23/23 256 lb 6.4 oz (116.3 kg)  12/31/22 254 lb 9.6 oz (115.5 kg)  06/19/22 264 lb 12.8 oz (120.1 kg)           No data to display            ASSESSMENT AND PLAN:  1.  Coronary artery disease involving native coronary arteries without angina: He  is doing very well with no anginal symptoms.  It has been almost 2 years since his myocardial infarction and stent placement.  Thus, I elected to discontinue ticagrelor .  Continue aspirin  81 mg once daily.  2.  Essential hypertension: He is mild bradycardic on carvedilol .  His ejection fraction is normal and he has no angina.  Thus, I elected to switch carvedilol  to losartan  50 mg daily.  Check basic metabolic profile in 1 week.  3.  Hyperlipidemia: Continue high-dose atorvastatin  and ezetimibe .  Most recent lipid profile in October showed an LDL of 59.  4.  Obstructive sleep apnea: Not  interested in CPAP.    Disposition:   FU with me in 6 months  Signed,  Deatrice Cage, MD  09/23/2023 3:07 PM    [facility]

## 2023-09-23 NOTE — Patient Instructions (Signed)
 Medication Instructions:  Your physician recommends the following medication changes.  STOP TAKING: Carvedilol  Brilinta   START TAKING: Losartan  50 mg once daily   *If you need a refill on your cardiac medications before your next appointment, please call your pharmacy*   Lab Work: Your provider would like for you to return in one week  to have the following labs drawn: BMET.   Please go to [facility] [address] ([facility] [address], [place] [address] You do not need an appointment.  They are open from 8 am- 4:30 pm.  Lunch from 1:00 pm- 2:00 pm You will not need to be fasting.  If you have labs (blood work) drawn today and your tests are completely normal, you will receive your results only by: MyChart Message (if you have MyChart) OR A paper copy in the mail If you have any lab test that is abnormal or we need to change your treatment, we will call you to review the results.   Testing/Procedures: None ordered   Follow-Up: At [facility], you and your health needs are our priority.  As part of our continuing mission to provide you with exceptional heart care, we have created designated Provider Care Teams.  These Care Teams include your primary Cardiologist (physician) and Advanced Practice Providers (APPs -  Physician Assistants and Nurse Practitioners) who all work together to provide you with the care you need, when you need it.  We recommend signing up for the patient portal called MyChart.  Sign up information is provided on this After Visit Summary.  MyChart is used to connect with patients for Virtual Visits (Telemedicine).  Patients are able to view lab/test results, encounter notes, upcoming appointments, etc.  Non-urgent messages can be sent to your provider as well.   To learn more about what you can do with MyChart, go to [email or web address].    Your next appointment:   6 month(s)  Provider:   You may see Deatrice Cage, MD or one of the following Advanced Practice Providers on your designated Care Team:   Lonni Meager, NP Bernardino Bring, PA-C Cadence Franchester, PA-C Tylene Lunch, NP Barnie Hila, NP

## 2023-09-24 ENCOUNTER — Ambulatory Visit: Payer: Medicare HMO | Admitting: Speech Pathology

## 2023-09-26 ENCOUNTER — Ambulatory Visit: Payer: Medicare HMO | Admitting: Speech Pathology

## 2023-09-29 ENCOUNTER — Ambulatory Visit: Payer: Medicare HMO | Admitting: Speech Pathology

## 2023-09-29 DIAGNOSIS — R4701 Aphasia: Secondary | ICD-10-CM | POA: Diagnosis not present

## 2023-09-29 DIAGNOSIS — R482 Apraxia: Secondary | ICD-10-CM | POA: Diagnosis not present

## 2023-09-29 DIAGNOSIS — I6602 Occlusion and stenosis of left middle cerebral artery: Secondary | ICD-10-CM

## 2023-09-29 NOTE — Therapy (Signed)
 OUTPATIENT SPEECH LANGUAGE PATHOLOGY TREATMENT NOTE      Patient Name: Alan Stewart MRN: [id number] DOB:23-Feb-1946, 78 y.o., male 87 Date: 09/29/2023  PCP: Eddy Goodell, MD REFERRING PROVIDER: Eddy Goodell, MD  END OF SESSION  End of Session - 09/29/23 1112     Visit Number 151    Number of Visits 155    Date for SLP Re-Evaluation 10/27/23    Authorization Type [employer]    Authorization Time Period 08/21/2023 thru 09/22/2023    Authorization - Visit Number 6    Authorization - Number of Visits 10    Progress Note Due on Visit 160    SLP Start Time 1015    SLP Stop Time  1100    SLP Time Calculation (min) 45 min    Activity Tolerance Patient tolerated treatment well             Past Medical History:  Diagnosis Date   CAD (coronary artery disease)    a. 10/2021 NSTEMI/PCI: LM nl, LAD min irregs, LCX 13m (2.5x18 Onyx Frontier DES), OM1 nl, RCA 42m (3.5x26 Onyx Frontier DES), 30d.   Diastolic dysfunction    a. 10/2021 Echo: EF 50-55%, no rwma, Gr1 DD, nl RV fxn. No significant valvular dzs.   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 SCCis. MOHs 07/08/22   Past Surgical History:  Procedure Laterality Date   COLONOSCOPY N/A 10/09/2015   Procedure: COLONOSCOPY;  Surgeon: Stephens Eis, MD;  Location: [facility] ENDOSCOPY;  Service: Gastroenterology;  Laterality: N/A;   CORONARY STENT INTERVENTION N/A 10/22/2021   Procedure: CORONARY STENT INTERVENTION;  Surgeon: Wenona Hamilton, MD;  Location: [facility] INVASIVE CV LAB;  Service: Cardiovascular;  Laterality: N/A;   IR CT HEAD LTD  05/21/2022   IR PERCUTANEOUS ART THROMBECTOMY/INFUSION INTRACRANIAL INC DIAG ANGIO  05/21/2022   LEFT HEART CATH AND CORONARY ANGIOGRAPHY N/A 10/22/2021   Procedure: LEFT HEART CATH AND  CORONARY ANGIOGRAPHY;  Surgeon: Wenona Hamilton, MD;  Location: [facility] INVASIVE CV LAB;  Service: Cardiovascular;  Laterality: N/A;   LOOP RECORDER INSERTION N/A 05/23/2022   Procedure: LOOP RECORDER INSERTION;  Surgeon: Boyce Byes, MD;  Location: [facility] INVASIVE CV LAB;  Service: Cardiovascular;  Laterality: N/A;   RADIOLOGY WITH ANESTHESIA N/A 05/20/2022   Procedure: IR WITH ANESTHESIA;  Surgeon: Luellen Sages, MD;  Location: [facility] OR;  Service: Radiology;  Laterality: N/A;   ROTATOR CUFF REPAIR     Patient Active Problem List   Diagnosis Date Noted   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 male with PMH: HTN, CAD, HLD, osteoarthritis, SCC, sleep apnea. He presented to the hospital on 05/20/22 with right sided weakness and aphasia with LKW reported by wife to be 10pm on 10/2.  He arrived at [facility] as  code stroke and taken for emergent CT/CTA which demonstrated left M1 occlusion. He received IV tenecteplase  and IR thrombectomy due to large vessel occlusion.       DIAGNOSTIC FINDINGS:  MRI 05/22/2022 Cortical ischemia throughout the left MCA territory compatible with recent infarct. Relatively little affect on the white matter. 2. Small focus of acute hemorrhage along the anterior surface of the left temporal lobe Heidelberg classification 3c: Subarachnoid hemorrhage. 3. Normal intracranial MRA.  Restored flow in left MCA.    THERAPY DIAG:  Aphasia  Apraxia  Middle cerebral artery embolism, left  Rationale for Evaluation and  Treatment Rehabilitation  SUBJECTIVE:   PAIN:  Are you having pain? No  PATIENT GOALS: to be able to communicate again  SUBJECTIVE STATEMENT: Pt engaged with talking about super bowl Pt accompanied by: self and significant other  OBJECTIVE:   TODAY'S TREATMENT: Skilled treatment session focused on pt's communication goals. SLP facilitated session by providing the following interventions:  While pt's wife reports that he is "talking some more," during today's session, he demonstrates reduced problem solving uses of language. For example when talking about the recent price of eggs, pt continued to say "what kind" repetitively. Despite multiple cues, he was not able to expand his communication attempts. Eventually we were able to figure out that he was referring to the kind that he likes and that the grocery store was out of those eggs ([employer]). Another example, occurred when pt was attempting to explain a commercial, he typed "soap" in the text to speech app but was not able to expand. Pt appeared to have increased frustration with his wife for her inability to guess based on these single word clues. Extensive education provided to pt on ways to increase his cues but he was not able to demonstrate during the session. Will continue to target.   Education details: see above Person educated: Patient and Wife Education method: Explanation, Facilities manager, Verbal cues, and Handouts Education comprehension: verbalized understanding  HOME EXERCISE PROGRAM: Practice functional phrases contained in Text to Speech App Word lists  GOALS: Goals reviewed with patient? Yes  SHORT TERM GOALS: Target date: 10 sessions   UPDATED: 07/21/2023   UPDATED: 09/15/2023 3.   Pt will improve wording finding abilities by tying phrase level utterance into Text to Speech App with maximal faded to moderate cues.  - progress made 5. Pt will improve spelling ability by accurately spelling SEMI-COMPLEX  words during communication exchanges to increase listener understanding with min A in 6 out of 8 opportunities; - progress made 6.  Pt will read self-generated words during communication exchanges with > 75% speech intelligibility given Min A articulatory cues - progress made 7.  With multimodal cues from caregiver, pt will respond to basic simple questions in 5 out of 7 opportunities using multimodal forms of communication based on caregiver report. - progress made  LONG TERM GOALS: Target date: 10/27/2023    UPDATED 09/15/2023  Pt will use multimodal communication to express basic wants/needs in 7 of 10 opportunities.  Baseline: unable Goal status: ONGOING; progress made 2.  Pt will initiate communication in 3 of 5 opportunities as reported by caregiver outside of ST sessions.   Baseline: no initiation  Goal Status: Initial: ONGOING; his wife reports mild increase in attempts - progress made    ASSESSMENT:  CLINICAL IMPRESSION: Pt is a 78 year old male who was seen today for therapy targeting his aphasia. Pt with improved communication using text to speech app.  Pt continues to demonstrate improved speech intelligibility and use of accurate motor speech patterns.   See the above detailed treatment note.    OBJECTIVE IMPAIRMENTS include expressive language, receptive language, aphasia, and apraxia. These impairments are limiting patient from managing medications, managing appointments, managing finances, household responsibilities, ADLs/IADLs, and effectively communicating at home and in community. Factors affecting potential to achieve goals and functional outcome are severity of impairments. Patient will benefit from skilled SLP services to address above impairments and improve overall function.   REHAB POTENTIAL: Excellent  PLAN: SLP FREQUENCY: 1x/week  SLP DURATION: 12 weeks  PLANNED INTERVENTIONS: Language facilitation, Environmental controls, Internal/external aids, Functional  tasks, Multimodal communication approach, SLP instruction and feedback, Compensatory strategies, and Patient/family education    Braelyn Jenson B. Garlin Junker, M.S., CCC-SLP, Tree surgeon Certified Brain Injury Specialist [facility]  [facility] Rehabilitation Services Office [phone] Ascom [phone] Fax [phone]

## 2023-09-30 DIAGNOSIS — H524 Presbyopia: Secondary | ICD-10-CM | POA: Diagnosis not present

## 2023-10-01 ENCOUNTER — Ambulatory Visit: Payer: Medicare HMO | Admitting: Speech Pathology

## 2023-10-03 ENCOUNTER — Ambulatory Visit: Payer: Medicare HMO | Admitting: Speech Pathology

## 2023-10-06 ENCOUNTER — Ambulatory Visit (INDEPENDENT_AMBULATORY_CARE_PROVIDER_SITE_OTHER): Payer: Medicare HMO

## 2023-10-06 ENCOUNTER — Ambulatory Visit: Payer: Medicare HMO | Admitting: Speech Pathology

## 2023-10-06 DIAGNOSIS — I63412 Cerebral infarction due to embolism of left middle cerebral artery: Secondary | ICD-10-CM

## 2023-10-08 ENCOUNTER — Ambulatory Visit: Payer: Medicare HMO | Admitting: Speech Pathology

## 2023-10-08 ENCOUNTER — Encounter: Payer: Self-pay | Admitting: Cardiology

## 2023-10-08 LAB — CUP PACEART REMOTE DEVICE CHECK
Date Time Interrogation Session: 20250217120014
Implantable Pulse Generator Implant Date: 20231005
Pulse Gen Model: 450218
Pulse Gen Serial Number: 95054077

## 2023-10-10 ENCOUNTER — Ambulatory Visit: Payer: Medicare HMO | Admitting: Speech Pathology

## 2023-10-13 ENCOUNTER — Other Ambulatory Visit: Payer: Self-pay | Admitting: Cardiovascular Disease

## 2023-10-13 ENCOUNTER — Ambulatory Visit: Payer: Medicare HMO | Admitting: Speech Pathology

## 2023-10-13 DIAGNOSIS — R4701 Aphasia: Secondary | ICD-10-CM

## 2023-10-13 DIAGNOSIS — I6602 Occlusion and stenosis of left middle cerebral artery: Secondary | ICD-10-CM | POA: Diagnosis not present

## 2023-10-13 DIAGNOSIS — R482 Apraxia: Secondary | ICD-10-CM

## 2023-10-13 NOTE — Therapy (Signed)
 OUTPATIENT SPEECH LANGUAGE PATHOLOGY TREATMENT NOTE DISCHARGE SUMMARY      Patient Name: Alan Stewart MRN: [id number] DOB:1946-03-26, 78 y.o., male 55 Date: 10/13/2023  PCP: Daniel Nones, MD REFERRING PROVIDER: Daniel Nones, MD  END OF SESSION  End of Session - 10/13/23 1258     Visit Number 152    Number of Visits 155    Date for SLP Re-Evaluation 10/27/23    Authorization Type [employer] H[employer]    Authorization Time Period 08/21/2023 thru 09/22/2023    Authorization - Visit Number 7    Authorization - Number of Visits 10    Progress Note Due on Visit 160    SLP Start Time 1015    SLP Stop Time  1100    SLP Time Calculation (min) 45 min    Activity Tolerance Patient tolerated treatment well             Past Medical History:  Diagnosis Date   CAD (coronary artery disease)    a. 10/2021 NSTEMI/PCI: LM nl, LAD min irregs, LCX 57m (2.5x18 Onyx Frontier DES), OM1 nl, RCA 21m (3.5x26 Onyx Frontier DES), 30d.   Diastolic dysfunction    a. 10/2021 Echo: EF 50-55%, no rwma, Gr1 DD, nl RV fxn. No significant valvular dzs.   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 SCCis. MOHs 07/08/22   Past Surgical History:  Procedure Laterality Date   COLONOSCOPY N/A 10/09/2015   Procedure: COLONOSCOPY;  Surgeon: Wallace Cullens, MD;  Location: [facility] ENDOSCOPY;  Service: Gastroenterology;  Laterality: N/A;   CORONARY STENT INTERVENTION N/A 10/22/2021   Procedure: CORONARY STENT INTERVENTION;  Surgeon: Iran Ouch, MD;  Location: [facility] INVASIVE CV LAB;  Service: Cardiovascular;  Laterality: N/A;   IR CT HEAD LTD  05/21/2022   IR PERCUTANEOUS ART THROMBECTOMY/INFUSION INTRACRANIAL INC DIAG ANGIO  05/21/2022   LEFT HEART CATH AND CORONARY ANGIOGRAPHY N/A 10/22/2021   Procedure:  LEFT HEART CATH AND CORONARY ANGIOGRAPHY;  Surgeon: Iran Ouch, MD;  Location: [facility] INVASIVE CV LAB;  Service: Cardiovascular;  Laterality: N/A;   LOOP RECORDER INSERTION N/A 05/23/2022   Procedure: LOOP RECORDER INSERTION;  Surgeon: Lanier Prude, MD;  Location: [facility] INVASIVE CV LAB;  Service: Cardiovascular;  Laterality: N/A;   RADIOLOGY WITH ANESTHESIA N/A 05/20/2022   Procedure: IR WITH ANESTHESIA;  Surgeon: Julieanne Cotton, MD;  Location: [facility] OR;  Service: Radiology;  Laterality: N/A;   ROTATOR CUFF REPAIR     Patient Active Problem List   Diagnosis Date Noted   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 male with PMH: HTN, CAD, HLD, osteoarthritis, SCC, sleep apnea. He presented to the hospital on 05/20/22 with right sided weakness and aphasia with LKW reported by wife to be 10pm on 10/2.  He arrived at [facility] as code stroke and taken for emergent CT/CTA which demonstrated left M1 occlusion. He received IV tenecteplase and IR thrombectomy due to large vessel occlusion.       DIAGNOSTIC FINDINGS:  MRI 05/22/2022 Cortical ischemia throughout the left MCA territory compatible with recent infarct. Relatively little affect on the white matter. 2. Small focus of acute hemorrhage along the anterior surface of the left temporal lobe Heidelberg classification 3c: Subarachnoid hemorrhage. 3. Normal intracranial MRA.  Restored flow in left MCA.    THERAPY DIAG:  Aphasia  Apraxia  Rationale for Evaluation and Treatment  Rehabilitation  SUBJECTIVE:   PAIN:  Are you having pain? No  PATIENT GOALS: to be able to communicate again  SUBJECTIVE STATEMENT: Pt making a frown when indicating today was his last day of speech therapy Pt accompanied by: self and significant other  OBJECTIVE:   TODAY'S TREATMENT: Skilled treatment session focused on pt's communication goals. SLP facilitated session by providing the following interventions:  Pt with observed increased use of gestures during the session with improved independent initiation/attempts to communicate. In addition, he independently initiated using his Text to Speech App to type in a word for communication but required cues to play the word and then repeat it. Continued education provided to his wife having him repeat the information that he enters. She reports that "he is using (pointing to the app on his phone) more"  Education details: see above Person educated: Patient and Wife Education method: Explanation, Demonstration, Verbal cues, and Handouts Education comprehension: verbalized understanding  HOME EXERCISE PROGRAM: Practice functional phrases contained in Text to Speech App Word lists  GOALS: Goals reviewed with patient? Yes  SHORT TERM GOALS: Target date: 10 sessions   UPDATED: 07/21/2023   UPDATED: 09/15/2023   UPDATED: 10/13/2023 3.   Pt will improve wording finding abilities by tying phrase level utterance into Text to Speech App with maximal faded to moderate cues.  - progress made: MET 5. Pt will improve spelling ability by accurately spelling SEMI-COMPLEX words during communication exchanges to increase listener understanding with min A in 6 out of 8 opportunities; - progress made: MET with word prediction on APP 6.  Pt will read self-generated words during communication exchanges with > 75% speech intelligibility given Min A articulatory cues - progress made: progress made 7.  With multimodal cues from caregiver, pt will respond  to basic simple questions in 5 out of 7 opportunities using multimodal forms of communication based on caregiver report. - progress made: MET  LONG TERM GOALS: Target date: 10/27/2023    UPDATED 09/15/2023   UPDATED: 10/13/2023  Pt will use multimodal communication to express basic wants/needs in 7 of 10 opportunities.  Baseline: unable Goal status: ONGOING; progress made: MET 2.  Pt will initiate communication in 3 of 5 opportunities as reported by caregiver outside of ST sessions.   Baseline: no initiation  Goal Status: Initial: ONGOING; his wife reports mild increase in attempts - progress made: MET    ASSESSMENT:  CLINICAL IMPRESSION: Pt is a 78 year old male who was seen today for therapy targeting his aphasia. Pt with improved communication using text to speech app.  Pt continues to demonstrate improved speech intelligibility and use of accurate motor speech patterns.  Pt continues to present with moderate nonfluent expressive aphasia with likely mild receptive language deficits (benefiting from intermittent repetition). 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  Basic spelling, typing and reading comprehension are a strength.   At this time, pt's progress is not likely to increase as a result of skilled ST sessions. He has met maximal potential from ST rehab. All education has been completed and discharge is appropriate.   PLAN: Pt is appropriate for discharge from skilled ST services.    Asaf Elmquist B. Dreama Saa, M.S., CCC-SLP, Tree surgeon Certified Brain Injury Specialist [facility]  [facility] Rehabilitation Services Office [phone] Ascom [phone] Fax [phone]

## 2023-10-13 NOTE — Progress Notes (Signed)
 [facility] Summary Report / Loop Recorder

## 2023-10-15 ENCOUNTER — Ambulatory Visit: Payer: Medicare HMO | Admitting: Speech Pathology

## 2023-10-17 ENCOUNTER — Ambulatory Visit: Payer: Medicare HMO | Admitting: Speech Pathology

## 2023-10-20 ENCOUNTER — Ambulatory Visit: Payer: Medicare HMO | Admitting: Speech Pathology

## 2023-10-22 ENCOUNTER — Ambulatory Visit: Payer: Medicare HMO | Admitting: Speech Pathology

## 2023-10-24 ENCOUNTER — Ambulatory Visit: Payer: Medicare HMO | Admitting: Speech Pathology

## 2023-10-27 ENCOUNTER — Ambulatory Visit: Payer: Medicare HMO | Admitting: Speech Pathology

## 2023-10-29 ENCOUNTER — Ambulatory Visit: Payer: Medicare HMO | Admitting: Speech Pathology

## 2023-10-31 ENCOUNTER — Ambulatory Visit: Payer: Medicare HMO | Admitting: Speech Pathology

## 2023-11-03 ENCOUNTER — Ambulatory Visit: Payer: Medicare HMO | Admitting: Speech Pathology

## 2023-11-05 ENCOUNTER — Ambulatory Visit: Payer: Medicare HMO | Admitting: Speech Pathology

## 2023-11-06 ENCOUNTER — Ambulatory Visit: Payer: Medicare HMO

## 2023-11-06 DIAGNOSIS — I63412 Cerebral infarction due to embolism of left middle cerebral artery: Secondary | ICD-10-CM

## 2023-11-07 LAB — CUP PACEART REMOTE DEVICE CHECK
Date Time Interrogation Session: 20250320085247
Implantable Pulse Generator Implant Date: 20231005
Pulse Gen Model: 450218
Pulse Gen Serial Number: 95054077

## 2023-11-09 ENCOUNTER — Encounter: Payer: Self-pay | Admitting: Cardiology

## 2023-11-10 ENCOUNTER — Ambulatory Visit: Payer: Medicare HMO | Admitting: Speech Pathology

## 2023-11-11 NOTE — Addendum Note (Signed)
 Addended by: Geralyn Flash D on: 11/11/2023 03:32 PM   Modules accepted: Orders

## 2023-11-11 NOTE — Progress Notes (Signed)
 [employer]

## 2023-11-12 ENCOUNTER — Ambulatory Visit: Payer: Medicare HMO | Admitting: Speech Pathology

## 2023-11-17 ENCOUNTER — Ambulatory Visit: Payer: Medicare HMO | Admitting: Speech Pathology

## 2023-11-19 ENCOUNTER — Ambulatory Visit: Payer: Medicare HMO | Admitting: Speech Pathology

## 2023-11-21 DIAGNOSIS — I10 Essential (primary) hypertension: Secondary | ICD-10-CM | POA: Diagnosis not present

## 2023-11-21 DIAGNOSIS — E7849 Other hyperlipidemia: Secondary | ICD-10-CM | POA: Diagnosis not present

## 2023-11-21 DIAGNOSIS — I2510 Atherosclerotic heart disease of native coronary artery without angina pectoris: Secondary | ICD-10-CM | POA: Diagnosis not present

## 2023-11-21 DIAGNOSIS — E039 Hypothyroidism, unspecified: Secondary | ICD-10-CM | POA: Diagnosis not present

## 2023-11-21 DIAGNOSIS — Z8673 Personal history of transient ischemic attack (TIA), and cerebral infarction without residual deficits: Secondary | ICD-10-CM | POA: Diagnosis not present

## 2023-11-21 DIAGNOSIS — E1165 Type 2 diabetes mellitus with hyperglycemia: Secondary | ICD-10-CM | POA: Diagnosis not present

## 2023-11-24 ENCOUNTER — Ambulatory Visit: Payer: Medicare HMO | Admitting: Speech Pathology

## 2023-11-26 ENCOUNTER — Ambulatory Visit: Payer: Medicare HMO | Admitting: Speech Pathology

## 2023-11-28 NOTE — Progress Notes (Signed)
 I agree with the above plan

## 2023-12-01 ENCOUNTER — Ambulatory Visit: Payer: Medicare HMO | Admitting: Speech Pathology

## 2023-12-02 DIAGNOSIS — Z0000 Encounter for general adult medical examination without abnormal findings: Secondary | ICD-10-CM | POA: Diagnosis not present

## 2023-12-02 DIAGNOSIS — Z6841 Body Mass Index (BMI) 40.0 and over, adult: Secondary | ICD-10-CM | POA: Diagnosis not present

## 2023-12-02 DIAGNOSIS — I10 Essential (primary) hypertension: Secondary | ICD-10-CM | POA: Diagnosis not present

## 2023-12-02 DIAGNOSIS — Z1331 Encounter for screening for depression: Secondary | ICD-10-CM | POA: Diagnosis not present

## 2023-12-02 DIAGNOSIS — E785 Hyperlipidemia, unspecified: Secondary | ICD-10-CM | POA: Diagnosis not present

## 2023-12-02 DIAGNOSIS — G4733 Obstructive sleep apnea (adult) (pediatric): Secondary | ICD-10-CM | POA: Diagnosis not present

## 2023-12-02 DIAGNOSIS — E039 Hypothyroidism, unspecified: Secondary | ICD-10-CM | POA: Diagnosis not present

## 2023-12-02 DIAGNOSIS — E119 Type 2 diabetes mellitus without complications: Secondary | ICD-10-CM | POA: Diagnosis not present

## 2023-12-02 DIAGNOSIS — E669 Obesity, unspecified: Secondary | ICD-10-CM | POA: Diagnosis not present

## 2023-12-08 ENCOUNTER — Ambulatory Visit: Payer: Medicare HMO

## 2023-12-08 DIAGNOSIS — I63412 Cerebral infarction due to embolism of left middle cerebral artery: Secondary | ICD-10-CM | POA: Diagnosis not present

## 2023-12-09 LAB — CUP PACEART REMOTE DEVICE CHECK
Date Time Interrogation Session: 20250421090639
Implantable Pulse Generator Implant Date: 20231005
Pulse Gen Model: 450218
Pulse Gen Serial Number: 95054077

## 2023-12-14 ENCOUNTER — Encounter: Payer: Self-pay | Admitting: Cardiology

## 2023-12-17 ENCOUNTER — Other Ambulatory Visit: Payer: Self-pay | Admitting: Cardiovascular Disease

## 2023-12-18 NOTE — Addendum Note (Signed)
 Addended by: Lott Rouleau A on: 12/18/2023 08:26 AM   Modules accepted: Orders

## 2023-12-18 NOTE — Progress Notes (Signed)
 [facility] Summary Report / Loop Recorder

## 2023-12-30 ENCOUNTER — Ambulatory Visit: Admitting: Dermatology

## 2023-12-30 ENCOUNTER — Encounter: Payer: Self-pay | Admitting: Dermatology

## 2023-12-30 DIAGNOSIS — Z79899 Other long term (current) drug therapy: Secondary | ICD-10-CM | POA: Diagnosis not present

## 2023-12-30 DIAGNOSIS — Z7189 Other specified counseling: Secondary | ICD-10-CM

## 2023-12-30 DIAGNOSIS — L237 Allergic contact dermatitis due to plants, except food: Secondary | ICD-10-CM

## 2023-12-30 MED ORDER — PREDNISONE 10 MG PO TABS: ORAL_TABLET | ORAL | 0 refills | Status: DC

## 2023-12-30 NOTE — Progress Notes (Signed)
   Follow-Up Visit   Subjective  Alan Stewart is a 78 y.o. male who presents for the following: Swelling R face, 1 day, no pain, no itching on face, has some itchy spots L arm, used calamine lotion worked in yard over weekend The patient has spots, moles and lesions to be evaluated, some may be new or changing and the patient may have concern these could be cancer.  The following portions of the chart were reviewed this encounter and updated as appropriate: medications, allergies, medical history  Review of Systems:  No other skin or systemic complaints except as noted in HPI or Assessment and Plan.  Objective  Well appearing patient in no apparent distress; mood and affect are within normal limits.  A focused examination was performed of the following areas: face Relevant exam findings are noted in the Assessment and Plan.            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 Condition is bothersome/symptomatic for patient. Currently flared. Treatment Plan: Start Prednisone 10mg  3wk taper as directed disp 150, 0 refills  Risks of prednisone taper include mood irritability, insomnia, weight gain, stomach ulcers, increased risk of infection, increased blood sugar (diabetes), hypertension, osteoporosis with long-term or frequent use, and rare risk of avascular necrosis of the hip.     Return if symptoms worsen or fail to improve.  I, Rollie Clipper, RMA, am acting as scribe for Celine Collard, MD .   Documentation: I have reviewed the above documentation for accuracy and completeness, and I agree with the above.  Celine Collard, MD

## 2023-12-30 NOTE — Patient Instructions (Addendum)
3 Week Prednisone Taper  You will be given a prescription for 150 tablets of oral Prednisone. It is very important that you take this according to the exact schedule provided below. This type of regimen for taking medication is often called a "taper", because your dosage will steadily decrease over a two week period until it is discontinued altogether.  ALWAYS take this medicine with food to prevent it from irritating your stomach. You should also take your Prednisone during morning hours.  Call the clinic at [phone] if you gain more than two pounds in one day, notice swelling anywhere on your body, have shortness of breath, black or red bowel movements, brown or red vomitus, desire to drink large amounts of fluids, a fever, or extreme weakness.   Oral Prednisone over Three Weeks  Day  Week 1  Week 2  Week 3   1  12  tablets  9 tablets  5 tablets   2  12 tablets  8 tablets  5 tablets   3  11 tablets  8 tablets  4 tablets   4  11 tablets  7 tablets  4 tablets   5  10 tablets  7 tablets  3 tablets   6  10 tablets  6 tablets  2 tablets   7  9 tablets  6 tablets  1 tablet    Due to recent changes in healthcare laws, you may see results of your pathology and/or laboratory studies on MyChart before the doctors have had a chance to review them. We understand that in some cases there may be results that are confusing or concerning to you. Please understand that not all results are received at the same time and often the doctors may need to interpret multiple results in order to provide you with the best plan of care or course of treatment. Therefore, we ask that you please give [place] 2 business days to thoroughly review all your results before contacting the office for clarification. Should we see a critical lab result, you will be contacted sooner.   If You Need Anything After Your Visit  If you have any questions or concerns for your doctor, please call our main line at [phone] and press  option 4 to reach your doctor's medical assistant. If no one answers, please leave a voicemail as directed and we will return your call as soon as possible. Messages left after 4 pm will be answered the following business day.   You may also send [place] a message via MyChart. We typically respond to MyChart messages within 1-2 business days.  For prescription refills, please ask your pharmacy to contact our office. Our fax number is [phone].  If you have an urgent issue when the clinic is closed that cannot wait until the next business day, you can page your doctor at the number below.    Please note that while we do our best to be available for urgent issues outside of office hours, we are not available 24/7.   If you have an urgent issue and are unable to reach [place], you may choose to seek medical care at your doctor's office, retail clinic, urgent care center, or emergency room.  If you have a medical emergency, please immediately call 911 or go to the emergency department.  Pager Numbers  - Dr. Gwen Pounds: [phone]  - Dr. Roseanne Reno: [phone]  - Dr. Katrinka Blazing: [phone]   In the event of inclement weather, please call our main line at [phone] for  an update on the status of any delays or closures.  Dermatology Medication Tips: Please keep the boxes that topical medications come in in order to help keep track of the instructions about where and how to use these. Pharmacies typically print the medication instructions only on the boxes and not directly on the medication tubes.   If your medication is too expensive, please contact our office at [phone] option 4 or send [place] a message through MyChart.   We are unable to tell what your co-pay for medications will be in advance as this is different depending on your insurance coverage. However, we may be able to find a substitute medication at lower cost or fill out paperwork to get insurance to cover a needed medication.   If a  prior authorization is required to get your medication covered by your insurance company, please allow [place] 1-2 business days to complete this process.  Drug prices often vary depending on where the prescription is filled and some pharmacies may offer cheaper prices.  The website www.goodrx.com contains coupons for medications through different pharmacies. The prices here do not account for what the cost may be with help from insurance (it may be cheaper with your insurance), but the website can give you the price if you did not use any insurance.  - You can print the associated coupon and take it with your prescription to the pharmacy.  - You may also stop by our office during regular business hours and pick up a GoodRx coupon card.  - If you need your prescription sent electronically to a different pharmacy, notify our office through [facility] or by phone at [phone] option 4.     Si Usted Necesita Algo Despus de Su Visita  Tambin puede enviarnos un mensaje a travs de Clinical cytogeneticist. Por lo general respondemos a los mensajes de MyChart en el transcurso de 1 a 2 das hbiles.  Para renovar recetas, por favor pida a su farmacia que se ponga en contacto con nuestra oficina. Annie Sable de fax es [place] [phone].  Si tiene un asunto urgente cuando la clnica est cerrada y que no puede esperar hasta el siguiente da hbil, puede llamar/localizar a su doctor(a) al nmero que aparece a continuacin.   Por favor, tenga en cuenta que aunque hacemos todo lo posible para estar disponibles para asuntos urgentes fuera del horario de [place], no estamos disponibles las 24 horas del da, los 7 [address]  [address] de la [place].   Si tiene un problema urgente y no puede comunicarse con nosotros, puede optar por buscar atencin mdica  en el consultorio de su doctor(a), en una clnica privada, en un centro de atencin urgente o en una sala de emergencias.  Si tiene Engineer, drilling, por favor llame  inmediatamente al 911 o vaya a la sala de emergencias.  Nmeros de bper  - Dr. Gwen Pounds: [phone]  - Dra. Roseanne Reno: [phone]  - Dr. Katrinka Blazing: [phone]   En caso de inclemencias del tiempo, por favor llame a Lacy Duverney principal al [phone] para una actualizacin sobre el [place] de cualquier retraso o cierre.  Consejos para la medicacin en dermatologa: Por favor, guarde las cajas en las que vienen los medicamentos de uso tpico para ayudarle a seguir las instrucciones sobre dnde y cmo usarlos. Las farmacias generalmente imprimen las instrucciones del medicamento slo en las cajas y no directamente en los tubos del [place].   Si su medicamento es [employer], por favor, pngase en contacto con Ferne Coe  oficina llamando al [phone] y presione la opcin 4 o envenos un mensaje a travs de Clinical cytogeneticist.   No podemos decirle cul ser su copago por los medicamentos por adelantado ya que esto es diferente dependiendo de la cobertura de su seguro. Sin embargo, es posible que podamos encontrar un medicamento sustituto a Audiological scientist un formulario para que el seguro cubra el medicamento que se considera necesario.   Si se requiere una autorizacin previa para que su compaa de seguros [place] su medicamento, por favor permtanos de 1 a 2 das hbiles para completar [address]  Los precios de los medicamentos varan con frecuencia dependiendo del Environmental consultant de dnde se surte la receta y alguna farmacias pueden ofrecer precios ms baratos.  El sitio web www.goodrx.com tiene cupones para medicamentos de Health and safety inspector. Los precios aqu no tienen en cuenta lo que podra costar con la ayuda del seguro (puede ser ms barato con su seguro), pero el sitio web puede darle el precio si no utiliz Tourist information centre manager.  - Puede imprimir el cupn correspondiente y llevarlo con su receta a la farmacia.  - Tambin puede pasar por nuestra oficina durante el horario de atencin regular y  Education officer, museum una tarjeta de cupones de GoodRx.  - Si necesita que su receta se enve electrnicamente a una farmacia diferente, informe a nuestra oficina a travs de MyChart de [place] o por telfono llamando al [phone] y presione la opcin 4.

## 2024-01-08 ENCOUNTER — Ambulatory Visit: Payer: Self-pay | Admitting: Cardiology

## 2024-01-08 ENCOUNTER — Ambulatory Visit (INDEPENDENT_AMBULATORY_CARE_PROVIDER_SITE_OTHER): Payer: Medicare HMO

## 2024-01-08 DIAGNOSIS — I63412 Cerebral infarction due to embolism of left middle cerebral artery: Secondary | ICD-10-CM | POA: Diagnosis not present

## 2024-01-08 LAB — CUP PACEART REMOTE DEVICE CHECK
Date Time Interrogation Session: 20250522112027
Implantable Pulse Generator Implant Date: 20231005
Pulse Gen Model: 450218
Pulse Gen Serial Number: 95054077

## 2024-01-20 ENCOUNTER — Encounter: Payer: Self-pay | Admitting: Dermatology

## 2024-01-20 ENCOUNTER — Ambulatory Visit (INDEPENDENT_AMBULATORY_CARE_PROVIDER_SITE_OTHER): Payer: Medicare HMO | Admitting: Dermatology

## 2024-01-20 DIAGNOSIS — L578 Other skin changes due to chronic exposure to nonionizing radiation: Secondary | ICD-10-CM

## 2024-01-20 DIAGNOSIS — W908XXA Exposure to other nonionizing radiation, initial encounter: Secondary | ICD-10-CM | POA: Diagnosis not present

## 2024-01-20 DIAGNOSIS — L570 Actinic keratosis: Secondary | ICD-10-CM

## 2024-01-20 DIAGNOSIS — C4491 Basal cell carcinoma of skin, unspecified: Secondary | ICD-10-CM

## 2024-01-20 DIAGNOSIS — C44212 Basal cell carcinoma of skin of right ear and external auricular canal: Secondary | ICD-10-CM

## 2024-01-20 DIAGNOSIS — D492 Neoplasm of unspecified behavior of bone, soft tissue, and skin: Secondary | ICD-10-CM | POA: Diagnosis not present

## 2024-01-20 DIAGNOSIS — D485 Neoplasm of uncertain behavior of skin: Secondary | ICD-10-CM

## 2024-01-20 HISTORY — DX: Basal cell carcinoma of skin, unspecified: C44.91

## 2024-01-20 NOTE — Patient Instructions (Addendum)

## 2024-01-20 NOTE — Progress Notes (Signed)
 Follow-Up Visit   Subjective  Alan Stewart is a 78 y.o. male who presents for the following: Ak follow up - recheck sun exposed areas. Hx allergic contact dermatitis, tx with Prednisone , clear now.   The patient has spots, moles and lesions to be evaluated, some may be new or changing.   The following portions of the chart were reviewed this encounter and updated as appropriate: medications, allergies, medical history  Review of Systems:  No other skin or systemic complaints except as noted in HPI or Assessment and Plan.  Objective  Well appearing patient in no apparent distress; mood and affect are within normal limits.   A focused examination was performed of the following areas: the face, ears, arms, scalp, and hands   Relevant exam findings are noted in the Assessment and Plan.  vertex scalp x 7, L sup forehead x 4, central forehead x 1, R sup forehead x 3, R mid forehead x 1, R frontal scalp x 2, R med cheek x 1, R upper cutaneous lip x 1, L upper cutaneous lip x 1, R forearm x 5, R dorsal hand x 3, L arm x 4, L dorsal hand x 4 (37) Erythematous thin papules/macules with gritty scale.  R mid helix 0.5 cm pink papule with telangiectasia.   Assessment & Plan   ACTINIC DAMAGE - chronic, secondary to cumulative UV radiation exposure/sun exposure over time - diffuse scaly erythematous macules with underlying dyspigmentation - Recommend daily broad spectrum sunscreen SPF 30+ to sun-exposed areas, reapply every 2 hours as needed.  - Recommend staying in the shade or wearing long sleeves, sun glasses (UVA+UVB protection) and wide brim hats (4-inch brim around the entire circumference of the hat). - Call for new or changing lesions. AK (ACTINIC KERATOSIS) (37) vertex scalp x 7, L sup forehead x 4, central forehead x 1, R sup forehead x 3, R mid forehead x 1, R frontal scalp x 2, R med cheek x 1, R upper cutaneous lip x 1, L upper cutaneous lip x 1, R forearm x 5, R dorsal hand x  3, L arm x 4, L dorsal hand x 4 (37) Actinic keratoses are precancerous spots that appear secondary to cumulative UV radiation exposure/sun exposure over time. They are chronic with expected duration over 1 year. A portion of actinic keratoses will progress to squamous cell carcinoma of the skin. It is not possible to reliably predict which spots will progress to skin cancer and so treatment is recommended to prevent development of skin cancer.  Recommend daily broad spectrum sunscreen SPF 30+ to sun-exposed areas, reapply every 2 hours as needed.  Recommend staying in the shade or wearing long sleeves, sun glasses (UVA+UVB protection) and wide brim hats (4-inch brim around the entire circumference of the hat). Call for new or changing lesions.  Destruction of lesion - vertex scalp x 7, L sup forehead x 4, central forehead x 1, R sup forehead x 3, R mid forehead x 1, R frontal scalp x 2, R med cheek x 1, R upper cutaneous lip x 1, L upper cutaneous lip x 1, R forearm x 5, R dorsal hand x 3, L arm x 4, L dorsal hand x 4 (37) Complexity: simple   Destruction method: cryotherapy   Informed consent: discussed and consent obtained   Timeout:  patient name, date of birth, surgical site, and procedure verified Lesion destroyed using liquid nitrogen: Yes   Region frozen until ice ball extended beyond lesion:  Yes   Outcome: patient tolerated procedure well with no complications   Post-procedure details: wound care instructions given   NEOPLASM OF UNCERTAIN BEHAVIOR OF SKIN R mid helix Skin / nail biopsy Type of biopsy: tangential   Informed consent: discussed and consent obtained   Timeout: patient name, date of birth, surgical site, and procedure verified   Procedure prep:  Patient was prepped and draped in usual sterile fashion Prep type:  Isopropyl alcohol  Anesthesia: the lesion was anesthetized in a standard fashion   Anesthetic:  1% lidocaine  w/ epinephrine  1-100,000 buffered w/ 8.4%  NaHCO3 Instrument used: flexible razor blade   Hemostasis achieved with: pressure, aluminum chloride and electrodesiccation   Outcome: patient tolerated procedure well   Post-procedure details: sterile dressing applied and wound care instructions given   Dressing type: bandage (Mupirocin 2% ointment)   Specimen 1 - Surgical pathology Differential Diagnosis: D48.5 r/o BCC vs SCC Check Margins: No ACTINIC ELASTOSIS    Return in about 6 months (around 07/21/2024) for TBSE - Hx AK, SCC.  I, Alan Stewart, CMA, am acting as scribe for Harris Liming, MD .   Documentation: I have reviewed the above documentation for accuracy and completeness, and I agree with the above.  Harris Liming, MD

## 2024-01-22 ENCOUNTER — Ambulatory Visit: Payer: Self-pay | Admitting: Dermatology

## 2024-01-22 DIAGNOSIS — C44212 Basal cell carcinoma of skin of right ear and external auricular canal: Secondary | ICD-10-CM

## 2024-01-22 DIAGNOSIS — C44219 Basal cell carcinoma of skin of left ear and external auricular canal: Secondary | ICD-10-CM

## 2024-01-22 LAB — SURGICAL PATHOLOGY

## 2024-01-23 NOTE — Addendum Note (Signed)
 Addended by: Edra Govern D on: 01/23/2024 03:20 PM   Modules accepted: Orders

## 2024-01-23 NOTE — Progress Notes (Signed)
 [employer]

## 2024-01-26 ENCOUNTER — Encounter: Payer: Self-pay | Admitting: Dermatology

## 2024-01-26 NOTE — Telephone Encounter (Signed)
-----   Message from [place] sent at 01/22/2024  6:04 PM EDT ----- Diagnosis: R mid helix :       BASAL CELL CARCINOMA, NODULAR PATTERN    Please call with diagnosis and refer to Mohs Dr Fain Home  Explanation: your biopsy shows a basal cell skin cancer in the second layer of the skin. This is the most common kind of skin cancer and is caused by damage from sun exposure. Basal cell skin cancers almost never spread beyond the skin, so they are not dangerous to your overall health. However, they will continue to grow, can bleed, cause nonhealing wounds, and disrupt nearby structures unless fully treated.  Treatment: Given the location and type of skin cancer, I recommend Mohs surgery. Mohs surgery involves cutting out the skin cancer and then checking under the microscope to ensure the whole skin cancer was removed. If any skin cancer remains, the surgeon will cut out more until it is fully removed. The cure rate is about 98-99%. Once the Mohs surgeon confirms the skin cancer is out, they will discuss the options to repair or heal the area. You must take it easy for about two weeks after surgery (no lifting over 10-15 lbs, avoid activity to get your heart rate and blood pressure up). It is done at another office outside of [place] ([place], [place], or [place]).

## 2024-01-27 ENCOUNTER — Ambulatory Visit: Payer: Medicare HMO | Admitting: Dermatology

## 2024-01-27 NOTE — Telephone Encounter (Signed)
 Patient's spouse advised of BX results and referral sent to Dr. Fain Home. Aw

## 2024-01-27 NOTE — Addendum Note (Signed)
 Addended by: Lisbeth Rides R on: 01/27/2024 04:30 PM   Modules accepted: Orders

## 2024-02-05 ENCOUNTER — Encounter: Payer: Self-pay | Admitting: Dermatology

## 2024-02-09 ENCOUNTER — Ambulatory Visit: Payer: Self-pay | Admitting: Cardiology

## 2024-02-09 ENCOUNTER — Ambulatory Visit (INDEPENDENT_AMBULATORY_CARE_PROVIDER_SITE_OTHER): Payer: Medicare HMO

## 2024-02-09 DIAGNOSIS — I63412 Cerebral infarction due to embolism of left middle cerebral artery: Secondary | ICD-10-CM | POA: Diagnosis not present

## 2024-02-09 LAB — CUP PACEART REMOTE DEVICE CHECK
Date Time Interrogation Session: 20250623111641
Implantable Pulse Generator Implant Date: 20231005
Pulse Gen Model: 450218
Pulse Gen Serial Number: 95054077

## 2024-02-14 ENCOUNTER — Encounter (HOSPITAL_COMMUNITY): Payer: Self-pay | Admitting: Interventional Radiology

## 2024-02-16 ENCOUNTER — Encounter: Payer: Self-pay | Admitting: Dermatology

## 2024-02-16 ENCOUNTER — Ambulatory Visit (INDEPENDENT_AMBULATORY_CARE_PROVIDER_SITE_OTHER): Admitting: Dermatology

## 2024-02-16 DIAGNOSIS — C4491 Basal cell carcinoma of skin, unspecified: Secondary | ICD-10-CM

## 2024-02-16 DIAGNOSIS — C44212 Basal cell carcinoma of skin of right ear and external auricular canal: Secondary | ICD-10-CM | POA: Diagnosis not present

## 2024-02-16 DIAGNOSIS — M2550 Pain in unspecified joint: Secondary | ICD-10-CM | POA: Insufficient documentation

## 2024-02-16 DIAGNOSIS — Z8489 Family history of other specified conditions: Secondary | ICD-10-CM | POA: Insufficient documentation

## 2024-02-16 DIAGNOSIS — L579 Skin changes due to chronic exposure to nonionizing radiation, unspecified: Secondary | ICD-10-CM | POA: Diagnosis not present

## 2024-02-16 DIAGNOSIS — L814 Other melanin hyperpigmentation: Secondary | ICD-10-CM | POA: Diagnosis not present

## 2024-02-16 DIAGNOSIS — N529 Male erectile dysfunction, unspecified: Secondary | ICD-10-CM | POA: Insufficient documentation

## 2024-02-16 DIAGNOSIS — L905 Scar conditions and fibrosis of skin: Secondary | ICD-10-CM | POA: Diagnosis not present

## 2024-02-16 MED ORDER — MUPIROCIN 2 % EX OINT: 1.0000 | TOPICAL_OINTMENT | Freq: Two times a day (BID) | CUTANEOUS | 4 refills | Status: DC

## 2024-02-16 NOTE — Progress Notes (Signed)
 Follow-Up Visit   Subjective  Alan Stewart is a 78 y.o. male accompanied by wife who presents for the following: Mohs of a Nodular Basal Cell Carcinoma of the right mid helix, referred by Dr. Claudene.   The following portions of the chart were reviewed this encounter and updated as appropriate: medications, allergies, medical history  Review of Systems:  No other skin or systemic complaints except as noted in HPI or Assessment and Plan.  Objective  Well appearing patient in no apparent distress; mood and affect are within normal limits.  A focused examination was performed of the following areas: Right mid helix Relevant physical exam findings are noted in the Assessment and Plan.   Right mid helix Healing biopsy site   Assessment & Plan   BASAL CELL CARCINOMA (BCC), UNSPECIFIED SITE Right mid helix Mohs surgery  Consent obtained: written  Anticoagulation: Is the patient taking prescription anticoagulant and/or aspirin  prescribed/recommended by a physician? Yes   Was the anticoagulation regimen changed prior to Mohs? No    Anesthesia: Anesthesia method: local infiltration Local anesthetic: lidocaine  1% WITH epi  Procedure Details: Timeout: pre-procedure verification complete Procedure Prep: patient was prepped and draped in usual sterile fashion Prep type: chlorhexidine  Biopsy accession number: [id number] Frozen section biopsy performed: No   Specimen debulked: No   Pre-Op diagnosis: basal cell carcinoma BCC subtype: nodular MohsAIQ Surgical site (if tumor spans multiple areas, please select predominant area): ear Surgery side: right Surgical site (from skin exam): Right mid helix Pre-operative length (cm): 0.6 Pre-operative width (cm): 0.7 Indications for Mohs surgery: anatomic location where tissue conservation is critical  Micrographic Surgery Details: Post-operative length (cm): 1.5 Post-operative width (cm): 0.8 Number of Mohs stages: 1 Cumulative  additional sections past 5 per stage: 1 Post surgery depth of defect: subcutaneous fat  Stage 1    Tumor features identified on Mohs section: basal carcinoma    Depth of tumor invasion after stage: subcutaneous fat  Antibiotics: Does patient meet AHA guidelines for endocarditis?: No   Does patient meet AHA guidelines for orthopedic prophylaxis?: No   Were antibiotics given on the day of surgery? Yes   When were antibiotics given? post-operative Did surgery breach mucosa, expose cartilage/bone, involve an area of lymphedema/inflamed/infected tissue? No    Skin repair Complexity:  Complex Informed consent: discussed and consent obtained   Timeout: patient name, date of birth, surgical site, and procedure verified   Procedure prep:  Patient was prepped and draped in usual sterile fashion Prep type:  Chlorhexidine  Anesthesia: the lesion was anesthetized in a standard fashion   Anesthetic:  1% lidocaine  w/ epinephrine  1-100,000 buffered w/ 8.4% NaHCO3 Reason for type of repair: reduce tension to allow closure, allow closure of the large defect, preserve normal anatomy and preserve normal anatomical and functional relationships   Undermining: area extensively undermined   Subcutaneous layers (deep stitches):  Suture size:  5-0 Suture type: Monocryl (poliglecaprone 25)   Stitches:  Buried vertical mattress Fine/surface layer approximation (top stitches):  Suture size:  6-0 Suture type: fast-absorbing plain gut   Stitches: simple running   Hemostasis achieved with: suture, pressure and electrodesiccation Outcome: patient tolerated procedure well with no complications   Post-procedure details: sterile dressing applied   Dressing type: bandage and pressure dressing     Return in about 4 weeks (around 03/15/2024) for S/O MOHS Right Helix .  I, Jetta Ager, am acting as scribe for Alan CHRISTELLA HOLY, MD.   02/16/2024  HISTORY OF PRESENT ILLNESS  Alan Stewart is seen in consultation at  the request of Dr. Claudene for biopsy-proven Nodular Basal Cell Carcinoma of the right helix. They note that the area has been present for about 6 months increasing in size with time.  There is no history of previous treatment.  Reports no other new or changing lesions and has no other complaints today.  Medications and allergies: see patient chart.  Review of systems: Reviewed 8 systems and notable for the above skin cancer.  All other systems reviewed are unremarkable/negative, unless noted in the HPI. Past medical history, surgical history, family history, social history were also reviewed and are noted in the chart/questionnaire.    PHYSICAL EXAMINATION  General: Well-appearing, in no acute distress, alert and oriented x 4. Vitals reviewed in chart (if available).   Skin: Exam reveals a 0.6 x 0.7 cm erythematous papule and biopsy scar on the right helix. There are rhytids, telangiectasias, and lentigines, consistent with photodamage.   Biopsy report(s) reviewed, confirming the diagnosis.   ASSESSMENT  1) Nodular Basal Cell Carcinoma of the right helix 2) photodamage 3) solar lentigines   PLAN   1. Due to location, size, histology, or recurrence and the likelihood of subclinical extension as well as the need to conserve normal surrounding tissue, the patient was deemed acceptable for Mohs micrographic surgery (MMS).  The nature and purpose of the procedure, associated benefits and risks including recurrence and scarring, possible complications such as pain, infection, and bleeding, and alternative methods of treatment if appropriate were discussed with the patient during consent. The lesion location was verified by the patient, by reviewing previous notes, pathology reports, and by photographs as well as angulation measurements if available.  Informed consent was reviewed and signed by the patient, and timeout was performed at 9:00 AM. See op note below.  2. For the photodamage and solar  lentigines, sun protection discussed/information given on OTC sunscreens, and we recommend continued regular follow-up with primary dermatologist every 6 months or sooner for any growing, bleeding, or changing lesions. 3. Prognosis and future surveillance discussed. 4. Letter with treatment outcome sent to referring provider. 5. Pain acetaminophen /ibuprofen   MOHS MICROGRAPHIC SURGERY AND RECONSTRUCTION  Initial size:   0.6 x 0.7 cm Surgical defect/wound size: 1.5 x 0.8 cm Anesthesia:    0.33% lidocaine  with 1:200,000 epinephrine  EBL:    <5 mL Complications:  None Repair type:   Complex SQ suture:   5-0 Monocryl Cutaneous suture:  6-0 Plain gut Final size of the repair: 3.2 cm  Stages:1   STAGE I: Anesthesia achieved with 0.5% lidocaine  with 1:200,000 epinephrine . ChloraPrep applied. 1 section(s) excised using Mohs technique (this includes total peripheral and deep tissue margin excision and evaluation with frozen sections, excised and interpreted by the same physician). The tumor was first debulked and then excised with an approx. 2mm margin.  Hemostasis was achieved with electrocautery as needed.  The specimen was then oriented, subdivided/relaxed, inked, and processed using Mohs technique.    Frozen section analysis revealed a clear deep and peripheral margin.   Reconstruction  The surgical wound was then cleaned, prepped, and re-anesthetized as above. Wound edges were undermined extensively along at least one entire edge and at a distance equal to or greater than the width of the defect (see wound defect size above) in order to achieve closure and decrease wound tension and anatomic distortion. Redundant tissue repair including standing cone removal was performed. Hemostasis was achieved with electrocautery. Subcutaneous and epidermal tissues were approximated  with the above sutures. The surgical site was then lightly scrubbed with sterile, saline-soaked gauze. The area was then  bandaged using Vaseline ointment, non-adherent gauze, gauze pads, and tape to provide an adequate pressure dressing. The patient tolerated the procedure well, was given detailed written and verbal wound care instructions, and was discharged in good condition.   The patient will follow-up: 4 weeks.    Documentation: I have reviewed the above documentation for accuracy and completeness, and I agree with the above.  Alan CHRISTELLA HOLY, MD

## 2024-02-16 NOTE — Patient Instructions (Addendum)

## 2024-02-18 ENCOUNTER — Encounter: Payer: Self-pay | Admitting: Dermatology

## 2024-02-19 NOTE — Progress Notes (Signed)
 [facility] Summary Report / Loop Recorder

## 2024-03-11 ENCOUNTER — Ambulatory Visit: Payer: Self-pay | Admitting: Cardiology

## 2024-03-11 ENCOUNTER — Ambulatory Visit: Payer: Medicare HMO

## 2024-03-11 DIAGNOSIS — I63412 Cerebral infarction due to embolism of left middle cerebral artery: Secondary | ICD-10-CM

## 2024-03-11 LAB — CUP PACEART REMOTE DEVICE CHECK
Date Time Interrogation Session: 20250724115318
Implantable Pulse Generator Implant Date: 20231005
Pulse Gen Model: 450218
Pulse Gen Serial Number: 95054077

## 2024-03-15 NOTE — Progress Notes (Signed)
 [employer]

## 2024-03-17 ENCOUNTER — Ambulatory Visit: Admitting: Dermatology

## 2024-04-05 ENCOUNTER — Other Ambulatory Visit: Payer: Self-pay | Admitting: Cardiovascular Disease

## 2024-04-12 ENCOUNTER — Ambulatory Visit: Payer: Medicare HMO

## 2024-04-12 DIAGNOSIS — I63412 Cerebral infarction due to embolism of left middle cerebral artery: Secondary | ICD-10-CM

## 2024-04-13 LAB — CUP PACEART REMOTE DEVICE CHECK
Date Time Interrogation Session: 20250825112402
Implantable Pulse Generator Implant Date: 20231005
Pulse Gen Model: 450218
Pulse Gen Serial Number: 95054077

## 2024-04-14 ENCOUNTER — Ambulatory Visit: Payer: Self-pay | Admitting: Cardiology

## 2024-05-05 NOTE — Progress Notes (Signed)
 Remote Loop Recorder Transmission

## 2024-05-13 ENCOUNTER — Ambulatory Visit: Payer: Medicare HMO

## 2024-05-13 DIAGNOSIS — I63412 Cerebral infarction due to embolism of left middle cerebral artery: Secondary | ICD-10-CM | POA: Diagnosis not present

## 2024-05-13 LAB — CUP PACEART REMOTE DEVICE CHECK
Date Time Interrogation Session: 20250925103359
Implantable Pulse Generator Implant Date: 20231005
Pulse Gen Model: 450218
Pulse Gen Serial Number: 95054077

## 2024-05-14 ENCOUNTER — Ambulatory Visit: Payer: Self-pay | Admitting: Cardiology

## 2024-05-17 NOTE — Progress Notes (Signed)
 Remote Loop Recorder Transmission

## 2024-05-20 NOTE — Progress Notes (Signed)
 Remote Loop Recorder Transmission

## 2024-06-14 ENCOUNTER — Ambulatory Visit

## 2024-06-14 DIAGNOSIS — I63412 Cerebral infarction due to embolism of left middle cerebral artery: Secondary | ICD-10-CM

## 2024-06-14 LAB — CUP PACEART REMOTE DEVICE CHECK
Date Time Interrogation Session: 20251027112036
Implantable Pulse Generator Implant Date: 20231005
Pulse Gen Model: 450218
Pulse Gen Serial Number: 95054077

## 2024-06-15 DIAGNOSIS — I2510 Atherosclerotic heart disease of native coronary artery without angina pectoris: Secondary | ICD-10-CM | POA: Diagnosis not present

## 2024-06-15 DIAGNOSIS — E1165 Type 2 diabetes mellitus with hyperglycemia: Secondary | ICD-10-CM | POA: Diagnosis not present

## 2024-06-15 DIAGNOSIS — E039 Hypothyroidism, unspecified: Secondary | ICD-10-CM | POA: Diagnosis not present

## 2024-06-15 DIAGNOSIS — G4733 Obstructive sleep apnea (adult) (pediatric): Secondary | ICD-10-CM | POA: Diagnosis not present

## 2024-06-15 DIAGNOSIS — E7849 Other hyperlipidemia: Secondary | ICD-10-CM | POA: Diagnosis not present

## 2024-06-16 ENCOUNTER — Ambulatory Visit: Payer: Self-pay | Admitting: Cardiology

## 2024-06-17 NOTE — Progress Notes (Signed)
 Remote Loop Recorder Transmission

## 2024-06-22 ENCOUNTER — Other Ambulatory Visit: Payer: Self-pay | Admitting: Cardiovascular Disease

## 2024-06-22 DIAGNOSIS — E1165 Type 2 diabetes mellitus with hyperglycemia: Secondary | ICD-10-CM | POA: Diagnosis not present

## 2024-06-22 DIAGNOSIS — G4733 Obstructive sleep apnea (adult) (pediatric): Secondary | ICD-10-CM | POA: Diagnosis not present

## 2024-06-22 DIAGNOSIS — I2510 Atherosclerotic heart disease of native coronary artery without angina pectoris: Secondary | ICD-10-CM | POA: Diagnosis not present

## 2024-06-22 DIAGNOSIS — E039 Hypothyroidism, unspecified: Secondary | ICD-10-CM | POA: Diagnosis not present

## 2024-06-22 DIAGNOSIS — E785 Hyperlipidemia, unspecified: Secondary | ICD-10-CM | POA: Diagnosis not present

## 2024-06-22 DIAGNOSIS — Z6841 Body Mass Index (BMI) 40.0 and over, adult: Secondary | ICD-10-CM | POA: Diagnosis not present

## 2024-06-22 DIAGNOSIS — I10 Essential (primary) hypertension: Secondary | ICD-10-CM | POA: Diagnosis not present

## 2024-06-22 DIAGNOSIS — I69928 Other speech and language deficits following unspecified cerebrovascular disease: Secondary | ICD-10-CM | POA: Diagnosis not present

## 2024-06-22 DIAGNOSIS — Z23 Encounter for immunization: Secondary | ICD-10-CM | POA: Diagnosis not present

## 2024-06-24 NOTE — Telephone Encounter (Signed)
 Please contact pt for future appointment. Pt due for follow up.

## 2024-06-29 ENCOUNTER — Telehealth: Payer: Self-pay | Admitting: Cardiovascular Disease

## 2024-06-29 DIAGNOSIS — I2510 Atherosclerotic heart disease of native coronary artery without angina pectoris: Secondary | ICD-10-CM | POA: Diagnosis not present

## 2024-06-29 DIAGNOSIS — K429 Umbilical hernia without obstruction or gangrene: Secondary | ICD-10-CM | POA: Diagnosis not present

## 2024-06-29 DIAGNOSIS — Z8673 Personal history of transient ischemic attack (TIA), and cerebral infarction without residual deficits: Secondary | ICD-10-CM | POA: Diagnosis not present

## 2024-06-29 NOTE — H&P (View-Only) (Signed)
 History of Present Illness Alan Stewart is a 78 year old male who presents for evaluation of an umbilical hernia. He was referred by Dr. Fernande for evaluation of his umbilical hernia.  He has had an umbilical hernia for many years, which has been slowly increasing in size. No pain but increasing discomfort is associated with the hernia, specially when wearing certain clothing, such as jeans or applying pressure.  He has a history of a heart attack and a stroke, with the stroke occurring approximately one year ago. He experiences some communication difficulties post-stroke but otherwise feels healthy.  He is overweight and is not currently on any specific diet or exercise regimen to lose weight. He previously engaged in activities like mowing the lawn and walking at the mall but has not been very active recently.      PAST MEDICAL HISTORY:  Past Medical History:  Diagnosis Date  . Benign hypertension 07/20/2015  . Coronary artery disease involving native coronary artery of native heart without angina pectoris 10/31/2021   Myocardial infarction 10/20/2021.  Cardiac catheterization with Prox RCA to Mid RCA lesion 95% stenosed. Dist RCA lesion 30% stenosed. Mid Cx lesion 99% stenosed.  drug-eluting stent (STENT ONYX FRONTIER) x 2 placed.  Echo at that time revealing normal heart function with grade 1 diastolic dysfunction   . Generalized osteoarthritis 07/20/2015  . Hyperglycemia 07/20/2015  . Hyperlipidemia 07/20/2015  . Obstructive sleep apnea 07/20/2015   By sleep study 6/05; CPAP 11 initiated        PAST SURGICAL HISTORY:   Past Surgical History:  Procedure Laterality Date  . ARTHROSCOPIC ROTATOR CUFF REPAIR Right 11/2008  . COLONOSCOPY  10/09/2015   Fair colon prep/Entire examined colon is norma/Repeat 71yrs/PYO         MEDICATIONS:  Outpatient Encounter Medications as of 06/29/2024  Medication Sig Dispense Refill  . aspirin  81 MG EC tablet Take 81 mg by mouth once daily    .  atorvastatin  (LIPITOR ) 80 MG tablet TAKE ONE TABLET ONCE DAILY 90 tablet 1  . ezetimibe  (ZETIA ) 10 mg tablet Take 10 mg by mouth once daily    . levothyroxine  (SYNTHROID ) 75 MCG tablet TAKE 1 TABLET EVERY DAY ON EMPTY STOMACHWITH A GLASS OF WATER AT LEAST 30-60 MINBEFORE BREAKFAST 90 tablet 3  . losartan  (COZAAR ) 50 MG tablet Take 50 mg by mouth once daily    . nitroGLYcerin  (NITROSTAT ) 0.4 MG SL tablet PLACE 1 TABLET UNDER TONGUE EVERY 5 MIN AS NEEDED FOR CHEST PAIN IF NO RELIEF IN15 MIN CALL 911 (MAX 3 TABS) 30 tablet 11   No facility-administered encounter medications on file as of 06/29/2024.     ALLERGIES:   Patient has no known allergies.   SOCIAL HISTORY:  Social History   Socioeconomic History  . Marital status: Married  Tobacco Use  . Smoking status: Former    Current packs/day: 0.00    Types: Cigarettes    Quit date: 07/19/1990    Years since quitting: 33.9  . Smokeless tobacco: Former  Substance and Sexual Activity  . Alcohol  use: Not Currently   Social Drivers of Health   Financial Resource Strain: Low Risk  (06/29/2024)   Overall Financial Resource Strain (CARDIA)   . Difficulty of Paying Living Expenses: Not hard at all  Food Insecurity: No Food Insecurity (06/29/2024)   Hunger Vital Sign   . Worried About Programme Researcher, Broadcasting/film/video in the Last Year: Never true   . Ran Out of Food in the Last  Year: Never true  Transportation Needs: No Transportation Needs (06/29/2024)   PRAPARE - Transportation   . Lack of Transportation (Medical): No   . Lack of Transportation (Non-Medical): No    FAMILY HISTORY:  Family History  Problem Relation Name Age of Onset  . Valvular heart disease Mother       GENERAL REVIEW OF SYSTEMS:   General ROS: negative for - chills, fatigue, fever, weight gain or weight loss Allergy and Immunology ROS: negative for - hives  Hematological and Lymphatic ROS: negative for - bleeding problems or bruising, negative for palpable nodes Endocrine  ROS: negative for - heat or cold intolerance, hair changes Respiratory ROS: negative for - cough, shortness of breath or wheezing Cardiovascular ROS: no chest pain or palpitations GI ROS: negative for nausea, vomiting, abdominal pain, diarrhea, constipation Musculoskeletal ROS: negative for - joint swelling or muscle pain Neurological ROS: negative for - confusion, syncope Dermatological ROS: negative for pruritus and rash  PHYSICAL EXAM:  Vitals:   06/29/24 1036  BP: 128/76  Pulse: 58  .  Ht:167.6 cm (5' 6) Wt:(!) 121.6 kg (268 lb) ADJ:Anib surface area is 2.38 meters squared. Body mass index is 43.26 kg/m.SABRA   GENERAL: Alert, active, oriented x3  HEENT: Pupils equal reactive to light. Extraocular movements are intact. Sclera clear. Palpebral conjunctiva normal red color.Pharynx clear.  NECK: Supple with no palpable mass and no adenopathy.  LUNGS: Sound clear with no rales rhonchi or wheezes.  HEART: Regular rhythm S1 and S2 without murmur.  ABDOMEN: Soft and depressible, nontender with no palpable mass, no hepatomegaly.  Large umbilical hernia, now able to be reduced.  EXTREMITIES: Well-developed well-nourished symmetrical with no dependent edema.  NEUROLOGICAL: Awake alert oriented, facial expression symmetrical, moving all extremities.   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 Surgical repair is indicated due to its increasing size and now not able to be reduced. Surgery risks include recurrence, particularly due to his overweight status, and anesthesia risks given his history of stroke. Benefits of surgery include preventing further enlargement and potential complications. After shared decision-making, he agreed to proceed with surgery, considering timing around family events and  personal preference.  Also the fact that patient is getting younger I think that right now he is as optimized and stable as he is going to be.  If he started having crescendo symptoms or even complications such as obstruction in the future an organ/emergent surgery will be even higher risk.  A laparoscopic hernia repair with mesh placement is scheduled. A letter for surgical clearance has been sent to his cardiologist. Post-operative care, including regular showering, using ice packs for swelling, and taking prescribed pain medication as needed. He was also instructed to monitor for constipation post-surgery and manage it accordingly.   Umbilical hernia without obstruction and without gangrene [K42.9]          Patient and his wife verbalized understanding, all questions were answered, and were agreeable with the plan outlined above.   Lucas Sjogren, MD  Electronically signed by Lucas Sjogren, MD

## 2024-06-29 NOTE — Telephone Encounter (Signed)
   Pre-operative Risk Assessment    Patient Name: Alan Stewart  DOB: [birth year] MRN: [id number]   Date of last office visit: 09/23/2023 Date of next office visit: PRN   Request for Surgical Clearance    Procedure:  Umbilical hernia  Date of Surgery:  Clearance 07/19/24                                Surgeon:  Dr. Cesar Socks Group or Practice Name:  [facility] Phone number:  [phone] Fax number:  [phone]   Type of Clearance Requested:   - Pharmacy:  Hold Aspirin  instructions   Type of Anesthesia:  Not Indicated   Additional requests/questions:    Bonney Audrene LOISE Agapito   06/29/2024, 2:09 PM

## 2024-06-30 NOTE — Telephone Encounter (Signed)
 Dr. Darron - this patient has hx of CAD with NSTEMI 10/2021 with PCI to LCx and RCA, also hospitalized 05/2022 with CVA with loop recorder followed by EP. Surgery team requesting to hold aspirin  due to need for umbilical hernia repair but history falls outside DAPT algorithm due to stent size as well as CVA. We will plan to do virtual visit, but are you OK with patient holding ASA up to 7 days if needed prior to umbilical hernia? - Please route response to P CV DIV PREOP (the pre-op pool). Thank you.

## 2024-07-01 NOTE — Telephone Encounter (Signed)
 Aspirin  81 mg once daily should not be held.

## 2024-07-02 ENCOUNTER — Ambulatory Visit: Payer: Self-pay | Admitting: General Surgery

## 2024-07-02 ENCOUNTER — Telehealth: Payer: Self-pay

## 2024-07-02 NOTE — Telephone Encounter (Signed)
 Patients wife Alan Stewart returned the call. Schedule a televisit for a pre-op clearance on 07/09/24 @ 10:20. Meds, Rec, and consent done.  Patient wife Alan Stewart states to call this number [phone], due that patient just had a stoke and his speech is not great.       Patient Consent for Virtual Visit        Alan Stewart has provided verbal consent on 07/02/2024 for a virtual visit (video or telephone).   CONSENT FOR VIRTUAL VISIT FOR:  Alan Stewart  By participating in this virtual visit I agree to the following:  I hereby voluntarily request, consent and authorize [facility] and its employed or contracted physicians, physician assistants, nurse practitioners or other licensed health care professionals (the Practitioner), to provide me with telemedicine health care services (the "Services) as deemed necessary by the treating Practitioner. I acknowledge and consent to receive the Services by the Practitioner via telemedicine. I understand that the telemedicine visit will involve communicating with the Practitioner through live audiovisual communication technology and the disclosure of certain medical information by electronic transmission. I acknowledge that I have been given the opportunity to request an in-person assessment or other available alternative prior to the telemedicine visit and am voluntarily participating in the telemedicine visit.  I understand that I have the right to withhold or withdraw my consent to the use of telemedicine in the course of my care at any time, without affecting my right to future care or treatment, and that the Practitioner or I may terminate the telemedicine visit at any time. I understand that I have the right to inspect all information obtained and/or recorded in the course of the telemedicine visit and may receive copies of available information for a reasonable fee.  I understand that some of the potential risks of receiving the Services via  telemedicine include:  Delay or interruption in medical evaluation due to technological equipment failure or disruption; Information transmitted may not be sufficient (e.g. poor resolution of images) to allow for appropriate medical decision making by the Practitioner; and/or  In rare instances, security protocols could fail, causing a breach of personal health information.  Furthermore, I acknowledge that it is my responsibility to provide information about my medical history, conditions and care that is complete and accurate to the best of my ability. I acknowledge that Practitioner's advice, recommendations, and/or decision may be based on factors not within their control, such as incomplete or inaccurate data provided by me or distortions of diagnostic images or specimens that may result from electronic transmissions. I understand that the practice of medicine is not an exact science and that Practitioner makes no warranties or guarantees regarding treatment outcomes. I acknowledge that a copy of this consent can be made available to me via my patient portal [facility] MyChart), or I can request a printed copy by calling the office of [facility].    I understand that my insurance will be billed for this visit.   I have read or had this consent read to me. I understand the contents of this consent, which adequately explains the benefits and risks of the Services being provided via telemedicine.  I have been provided ample opportunity to ask questions regarding this consent and the Services and have had my questions answered to my satisfaction. I give my informed consent for the services to be provided through the use of telemedicine in my medical care

## 2024-07-02 NOTE — Telephone Encounter (Signed)
   Name: Alan Stewart  DOB: 11-19-1945  MRN: [id number]  Primary Cardiologist: Deatrice Cage, MD   Preoperative team, please contact this patient and set up a phone call appointment for further preoperative risk assessment. Please obtain consent and complete medication review. Thank you for your help.  I confirm that guidance regarding antiplatelet and oral anticoagulation therapy has been completed and, if necessary, noted below: Per Dr. Cage, Aspirin  should NOT be held perioperatively.   I also confirmed the patient resides in the state of [place] . As per [facility] Medical Board telemedicine laws, the patient must reside in the state in which the provider is licensed.   Ridhima Golberg E Heavenlee Maiorana, PA-C 07/02/2024, 7:14 AM [facility]

## 2024-07-02 NOTE — Telephone Encounter (Signed)
 Patient is returning call.

## 2024-07-02 NOTE — Telephone Encounter (Signed)
 Patients wife Sherrilyn returned the call. Schedule a televisit for a pre-op clearance on 07/09/24 @ 10:20. Meds, Rec, and consent done.  Patient wife Sherrilyn states to call this number [phone], due that patient just had a stoke and his speech is not great.

## 2024-07-02 NOTE — Telephone Encounter (Signed)
 Attempted to called patient for a pre-op clearance televisit appointment, no answer left a vm to return the call

## 2024-07-02 NOTE — Telephone Encounter (Signed)
 Attempted to called patient due to the patient returning the call, for a pre-op clearance televisit appointment, no answer left a vm to return the call

## 2024-07-09 ENCOUNTER — Ambulatory Visit: Attending: Internal Medicine | Admitting: Physician Assistant

## 2024-07-09 DIAGNOSIS — Z01810 Encounter for preprocedural cardiovascular examination: Secondary | ICD-10-CM | POA: Diagnosis not present

## 2024-07-09 NOTE — Progress Notes (Signed)
 Virtual Visit via Telephone Note   Because of Alan Stewart co-morbid illnesses, he is at least at moderate risk for complications without adequate follow up.  This format is felt to be most appropriate for this patient at this time.  Due to technical limitations with video connection (technology), today's appointment will be conducted as an audio only telehealth visit, and Alan Stewart verbally agreed to proceed in this manner.   All issues noted in this document were discussed and addressed.  No physical exam could be performed with this format.  Evaluation Performed:  Preoperative cardiovascular risk assessment _____________   Date:  07/09/2024   Patient ID:  Alan Stewart, DOB 1946-07-11, MRN [id number] Patient Location:  Home Provider location:   Office  Primary Care Provider:  [name], MD Primary Cardiologist:  Deatrice Cage, MD  Chief Complaint / Patient Profile   78 y.o. y/o male with a h/o CAD s/p PCI, hyperlipidemia, hypertension, CVA 05/2022, morbid obesity, OSA, diastolic dysfunction who is pending umbilical hernia repair and presents today for telephonic preoperative cardiovascular risk assessment.  History of Present Illness    Alan Stewart is a 78 y.o. male who presents via audio/video conferencing for a telehealth visit today.  Pt was last seen in cardiology clinic on 2//25 by Dr. Cage.  At that time Alan Stewart was doing well without chest pain or shortness of breath.  The patient is now pending procedure as outlined above. Since his last visit, he has been doing well without any issues.  He has not had any chest pain, shortness of breath or swelling in his lower feet or ankles.  He does meet minimum METS for clearance.  I confirm that guidance regarding antiplatelet and oral anticoagulation therapy has been completed and, if necessary, noted below: Per Dr. Cage, Aspirin  should NOT be held perioperatively.  Past Medical History    Past Medical  History:  Diagnosis Date   BCC (basal cell carcinoma) 01/20/2024   right mid helix, needs Mohs   CAD (coronary artery disease)    a. 10/2021 NSTEMI/PCI: LM nl, LAD min irregs, LCX 59m (2.5x18 Onyx Frontier DES), OM1 nl, RCA 7m (3.5x26 Onyx Frontier DES), 30d.   Diastolic dysfunction    a. 10/2021 Echo: EF 50-55%, no rwma, Gr1 DD, nl RV fxn. No significant valvular dzs.   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 SCCis. MOHs 07/08/22   Past Surgical History:  Procedure Laterality Date   COLONOSCOPY N/A 10/09/2015   Procedure: COLONOSCOPY;  Surgeon: Deward CINDERELLA Piedmont, MD;  Location: [facility] ENDOSCOPY;  Service: Gastroenterology;  Laterality: N/A;   CORONARY STENT INTERVENTION N/A 10/22/2021   Procedure: CORONARY STENT INTERVENTION;  Surgeon: Cage Deatrice LABOR, MD;  Location: [facility] INVASIVE CV LAB;  Service: Cardiovascular;  Laterality: N/A;   IR CT HEAD LTD  05/21/2022   IR PERCUTANEOUS ART THROMBECTOMY/INFUSION INTRACRANIAL INC DIAG ANGIO  05/21/2022   LEFT HEART CATH AND CORONARY ANGIOGRAPHY N/A 10/22/2021   Procedure: LEFT HEART CATH AND CORONARY ANGIOGRAPHY;  Surgeon: Cage Deatrice LABOR, MD;  Location: [facility] INVASIVE CV LAB;  Service: Cardiovascular;  Laterality: N/A;   LOOP RECORDER INSERTION N/A 05/23/2022   Procedure: LOOP RECORDER INSERTION;  Surgeon:  Cindie Ole DASEN, MD;  Location: [facility] INVASIVE CV LAB;  Service: Cardiovascular;  Laterality: N/A;   RADIOLOGY WITH ANESTHESIA N/A 05/20/2022   Procedure: IR WITH ANESTHESIA;  Surgeon: Dolphus Carrion, MD;  Location: [facility] OR;  Service: Radiology;  Laterality: N/A;   ROTATOR CUFF REPAIR      Allergies  No Known Allergies  Home Medications    Prior to Admission medications   Medication Sig Start Date End Date Taking? Authorizing Provider   aspirin  EC 81 MG EC tablet Take 1 tablet (81 mg total) by mouth daily. Swallow whole. 10/22/21   Patsy Lenis, MD  atorvastatin  (LIPITOR ) 80 MG tablet Take 1 tablet (80 mg total) by mouth daily. 10/23/21   Patsy Lenis, MD  ezetimibe  (ZETIA ) 10 MG tablet TAKE 1 TABLET BY MOUTH DAILY 04/07/24   Darron Deatrice LABOR, MD  levothyroxine  (SYNTHROID ) 75 MCG tablet Take 75 mcg by mouth daily. 01/26/24   [provider]  losartan  (COZAAR ) 50 MG tablet TAKE 1 TABLET BY MOUTH ONCE DAILY 06/24/24   Darron Deatrice LABOR, MD  nitroGLYCERIN  (NITROSTAT ) 0.4 MG SL tablet Place 1 tablet (0.4 mg total) under the tongue every 5 (five) minutes as needed for chest pain. 10/22/21   Patsy Lenis, MD    Physical Exam    Vital Signs:  Alan Stewart does not have vital signs available for review today.  Given telephonic nature of communication, physical exam is limited. AAOx3. NAD. Normal affect.  Speech and respirations are unlabored.  Accessory Clinical Findings    None  Assessment & Plan    1.  Preoperative Cardiovascular Risk Assessment:  [name]'s perioperative risk of a major cardiac event is 11% according to the Revised Cardiac Risk Index (RCRI).  Therefore, he is at high risk for perioperative complications.   His functional capacity is good at 6.36 METs according to the Duke Activity Status Index (DASI). Recommendations: According to ACC/AHA guidelines, no further cardiovascular testing needed.  The patient may proceed to surgery at acceptable risk.   Antiplatelet and/or Anticoagulation Recommendations: The patient should remain on Aspirin  without interruption.    The patient was advised that if he develops new symptoms prior to surgery to contact our office to arrange for a follow-up visit, and he verbalized understanding.  A copy of this note will be routed to requesting surgeon.  Time:   Today, I have spent 11 minutes with the patient with telehealth technology discussing medical history,  symptoms, and management plan.     [name], PA-C  07/09/2024, 7:35 AM

## 2024-07-12 ENCOUNTER — Other Ambulatory Visit: Payer: Self-pay

## 2024-07-12 ENCOUNTER — Encounter
Admission: RE | Admit: 2024-07-12 | Discharge: 2024-07-12 | Disposition: A | Discharge status: Home / Self Care | Source: Ambulatory Visit | Attending: General Surgery | Admitting: General Surgery

## 2024-07-12 DIAGNOSIS — I214 Non-ST elevation (NSTEMI) myocardial infarction: Secondary | ICD-10-CM

## 2024-07-12 DIAGNOSIS — K429 Umbilical hernia without obstruction or gangrene: Secondary | ICD-10-CM

## 2024-07-12 DIAGNOSIS — E1165 Type 2 diabetes mellitus with hyperglycemia: Secondary | ICD-10-CM

## 2024-07-12 HISTORY — DX: Umbilical hernia without obstruction or gangrene: K42.9

## 2024-07-12 HISTORY — DX: Cerebral infarction, unspecified: I63.9

## 2024-07-12 HISTORY — DX: Hypothyroidism, unspecified: E03.9

## 2024-07-12 HISTORY — DX: Prediabetes: R73.03

## 2024-07-12 HISTORY — DX: Acute myocardial infarction, unspecified: I21.9

## 2024-07-12 NOTE — Patient Instructions (Addendum)
 Your procedure is scheduled on: 07/19/24 - Monday Report to the Registration Desk on the 1st floor of the [facility]. To find out your arrival time, please call [phone] between 1PM - 3PM on: 07/16/24 - Friday If your arrival time is 6:00 am, do not arrive before that time as the [facility] entrance doors do not open until 6:00 am.  REMEMBER: Instructions that are not followed completely may result in serious medical risk, up to and including death; or upon the discretion of your surgeon and anesthesiologist your surgery may need to be rescheduled.  Do not eat food or drink any liquids after midnight the night before surgery.  No gum chewing or hard candies.  One week prior to surgery: Stop Anti-inflammatories (NSAIDS) such as Advil, Aleve, Ibuprofen, Motrin, Naproxen, Naprosyn and Aspirin  based products such as Excedrin, Goody's Powder, BC Powder. You may take Tylenol  if needed for pain up until the day of surgery.  Stop ANY OVER THE COUNTER supplements until after surgery.  losartan  (COZAAR ) hold on the morning of surgery.  ON THE DAY OF SURGERY ONLY TAKE THESE MEDICATIONS WITH SIPS OF WATER:  levothyroxine  (SYNTHROID )    No Alcohol  for 24 hours before or after surgery.  No Smoking including e-cigarettes for 24 hours before surgery.  No chewable tobacco products for at least 6 hours before surgery.  No nicotine patches on the day of surgery.  Do not use any recreational drugs for at least a week (preferably 2 weeks) before your surgery.  Please be advised that the combination of cocaine and anesthesia may have negative outcomes, up to and including death. If you test positive for cocaine, your surgery will be cancelled.  On the morning of surgery brush your teeth with toothpaste and water, you may rinse your mouth with mouthwash if you wish. Do not swallow any toothpaste or mouthwash.  Use CHG Soap or wipes as directed on instruction sheet.  Do not wear jewelry,  make-up, hairpins, clips or nail polish.  For welded (permanent) jewelry: bracelets, anklets, waist bands, etc.  Please have this removed prior to surgery.  If it is not removed, there is a chance that hospital personnel will need to cut it off on the day of surgery.  Do not wear lotions, powders, or perfumes.   Do not shave body hair from the neck down 48 hours before surgery.  Contact lenses, hearing aids and dentures may not be worn into surgery.  Do not bring valuables to the hospital. [facility] is not responsible for any missing/lost belongings or valuables.   Notify your doctor if there is any change in your medical condition (cold, fever, infection).  Wear comfortable clothing (specific to your surgery type) to the hospital.  After surgery, you can help prevent lung complications by doing breathing exercises.  Take deep breaths and cough every 1-2 hours. Your doctor may order a device called an Incentive Spirometer to help you take deep breaths.  When coughing or sneezing, hold a pillow firmly against your incision with both hands. This is called "splinting." Doing this helps protect your incision. It also decreases belly discomfort.  If you are being admitted to the hospital overnight, leave your suitcase in the car. After surgery it may be brought to your room.  In case of increased patient census, it may be necessary for you, the patient, to continue your postoperative care in the Same Day Surgery department.  If you are being discharged the day of surgery, you  will not be allowed to drive home. You will need a responsible individual to drive you home and stay with you for 24 hours after surgery.   If you are taking public transportation, you will need to have a responsible individual with you.  Please call the Pre-admissions Testing Dept. at [phone] if you have any questions about these instructions.  Surgery Visitation Policy:  Patients having surgery or a  procedure may have two visitors.  Children under the age of 68 must have an adult with them who is not the patient.  Inpatient Visitation:    Visiting hours are 7 a.m. to 8 p.m. Up to four visitors are allowed at one time in a patient room. The visitors may rotate out with other people during the day.  One visitor age 37 or older may stay with the patient overnight and must be in the room by 8 p.m.   Merchandiser, Retail to address health-related social needs:  [email or web address]                                                                                                             Preparing for Surgery with CHLORHEXIDINE  GLUCONATE (CHG) Soap  Chlorhexidine  Gluconate (CHG) Soap  o An antiseptic cleaner that kills germs and bonds with the skin to continue killing germs even after washing  o Used for showering the night before surgery and morning of surgery  Before surgery, you can play an important role by reducing the number of germs on your skin.  CHG (Chlorhexidine  gluconate) soap is an antiseptic cleanser which kills germs and bonds with the skin to continue killing germs even after washing.  Please do not use if you have an allergy to CHG or antibacterial soaps. If your skin becomes reddened/irritated stop using the CHG.  1. Shower the NIGHT BEFORE SURGERY with CHG soap.  2. If you choose to wash your hair, wash your hair first as usual with your normal shampoo.  3. After shampooing, rinse your hair and body thoroughly to remove the shampoo.  4. Use CHG as you would any other liquid soap. You can apply CHG directly to the skin and wash gently with a clean washcloth.  5. Apply the CHG soap to your body only from the neck down. Do not use on open wounds or open sores. Avoid contact with your eyes, ears, mouth, and genitals (private parts). Wash face and genitals (private parts) with your normal soap.  6. Wash thoroughly, paying special attention to  the area where your surgery will be performed.  7. Thoroughly rinse your body with warm water.  8. Do not shower/wash with your normal soap after using and rinsing off the CHG soap.  9. Do not use lotions, oils, etc., after showering with CHG.  10. Pat yourself dry with a clean towel.  11. Wear clean pajamas to bed the night before surgery.  12. Place clean sheets on your bed the night of your shower and do not sleep with pets.  13. Do not apply  any deodorants/lotions/powders.  14. Please wear clean clothes to the hospital.  15. Remember to brush your teeth with your regular toothpaste.

## 2024-07-13 ENCOUNTER — Encounter: Payer: Self-pay | Admitting: General Surgery

## 2024-07-13 ENCOUNTER — Encounter
Admission: RE | Admit: 2024-07-13 | Discharge: 2024-07-13 | Disposition: A | Discharge status: Home / Self Care | Source: Ambulatory Visit | Attending: General Surgery | Admitting: General Surgery

## 2024-07-13 DIAGNOSIS — Z01810 Encounter for preprocedural cardiovascular examination: Secondary | ICD-10-CM | POA: Diagnosis not present

## 2024-07-13 DIAGNOSIS — E1165 Type 2 diabetes mellitus with hyperglycemia: Secondary | ICD-10-CM | POA: Diagnosis not present

## 2024-07-13 DIAGNOSIS — I214 Non-ST elevation (NSTEMI) myocardial infarction: Secondary | ICD-10-CM | POA: Diagnosis not present

## 2024-07-13 NOTE — Progress Notes (Signed)
 Perioperative / Anesthesia Services  Pre-Admission Testing Clinical Review / Pre-Operative Anesthesia Consult  Date: 07/13/24  PATIENT DEMOGRAPHICS: Name: Alan Stewart DOB: [birth year] MRN:   [id number]  Note: Available PAT nursing documentation and vital signs have been reviewed. Clinical nursing staff has updated patient's PMH/PSHx, current medication list, and drug allergies/intolerances to ensure complete and comprehensive history available to assist care teams in MDM as it pertains to the aforementioned surgical procedure and anticipated anesthetic course. Extensive review of available clinical information personally performed. Nursing documentation reviewed. [name] PMH and PSHx updated with any diagnoses and/or procedures that I have knowledge of that may have been inadvertently omitted during his intake with the pre-admission testing department's nursing staff.  PLANNED SURGICAL PROCEDURE(S):   Case: [id number] Date/Time: 07/19/24 1209   Procedure: REPAIR, HERNIA, UMBILICAL, ROBOT-ASSISTED (Abdomen)   Anesthesia type: General   Pre-op diagnosis: K42.9 umbilical hernia w/o obstruction or gangrene   Location: [facility] OR ROOM 04 / [facility] ORS FOR ANESTHESIA GROUP   Surgeons: Rodolph Romano, Stewart        CLINICAL DISCUSSION: Alan Stewart is a 78 y.o. male who is submitted for pre-surgical anesthesia review and clearance prior to him undergoing the above procedure. Patient is a Former Smoker (15 pack years; quit 07/1990). Pertinent PMH includes: CAD, NSTEMI, diastolic dysfunction, CVA, BILATERAL carotid artery disease, aortic atherosclerosis, HTN, HLD, prediabetes, hypothyroidism, exposure to agent orange, OSAH (unable to tolerate nocturnal PAP therapy), umbilical hernia, OA.  Patient is followed by cardiology Alan Stewart). He was last seen in the cardiology clinic on 09/23/2023; notes reviewed. At the time of his clinic visit, patient doing well overall from a cardiovascular  perspective. Patient denied any chest pain, shortness of breath, PND, orthopnea, palpitations, significant peripheral edema, weakness, fatigue, vertiginous symptoms, or presyncope/syncope. Patient with a past medical history significant for cardiovascular diagnoses. Documented physical exam was grossly benign, providing no evidence of acute exacerbation and/or decompensation of the patient's known cardiovascular conditions.  Patient suffered an NSTEMI on 10/20/2021.  Troponin peaked at 40 to 53 ng/L.  Diagnostic LEFT heart catheterization was performed on 10/22/2021 revealing multivessel CAD: 95% proximal-mid RCA, 30% distal RCA, and 99% mid LCx.  PCI was subsequently performed placing a 3.5 x 26 mm Onyx Frontier DES x 1 to the proximal-mid RCA lesion and a 2.5 x 18 mm Onyx Frontier DES x 1 to the mid LCx lesion.  Procedure yielded excellent angiographic result and TIMI-3 flow.  Patient suffered an acute MCA territory CVA on 05/20/2022.  CT angiography imaging of the head and neck was performed revealing LVO of the terminus of the LICA and the entire LEFT MCA with minimal distribution collateralization.  Patient required IR thrombectomy.  Most recent TTE performed on 05/21/2022 revealed a normal left ventricular systolic function with an EF of 60-65%. There was no significant LVH.  There were no regional wall motion abnormalities. Left ventricular diastolic Doppler parameters consistent with abnormal relaxation (G1DD). Right ventricular size and function normal with a TAPSE measuring 2.1 cm  (normal range >/= 1.6 cm).  TR jet insufficient for assessing PA pressure.  There was no significant valvular regurgitation.  All transvalvular gradients were noted to be normal providing no evidence of hemodynamically significant valvular stenosis. Aorta normal in size with no evidence of ectasia or aneurysmal dilatation.  Following CVA, patient underwent ILR placement.  To date, implanted cardiac device has not  revealed any evidence of atrial fibrillation. . Blood pressure reasonably controlled at 136/60 mmHg on currently prescribed  ARB (losartan ) monotherapy.  Patient is on atorvastatin  + ezetimibe  for his HLD diagnosis and ASCVD prevention.  Patient has a supply of short acting nitrates (NTG) to use on a as needed basis for recurrent angina/anginal equivalent symptoms; denied recent use.  Patient has a prediabetes diagnosis that he is managing with diet lifestyle modification.  Most recent hemoglobin A1c was 5.9% at the time patient was seen by his cardiologist.  Of note, since patient was last seen by his cardiologist, A1c level has been rechecked multiple times.  Most recent hemoglobin A1c with further elevation to 6.3% when checked on 06/15/2024.  Patient does have an OSAH diagnosis, however he is unable to tolerate the mask required for nocturnal PAP therapy.  Patient is able to complete all of his  ADL/IADLs without cardiovascular limitation.  Per the DASI, patient is able to achieve at least 4 METS of physical activity without experiencing any significant degree of angina/anginal equivalent symptoms. No changes were made to his medication regimen during his visit with cardiology.  Patient scheduled to follow-up with outpatient cardiology in 6 months or sooner if needed.  Alan Stewart is scheduled for an elective REPAIR, HERNIA, UMBILICAL, ROBOT-ASSISTED (Abdomen) on 07/19/2024 with Dr. Lucas Sjogren, Stewart. Given patient's past medical history significant for cardiovascular diagnoses, presurgical cardiac clearance was sought by the PAT team. Per cardiology, Alan Stewart's perioperative risk of a major cardiac event is 11% according to the Revised Cardiac Risk Index (RCRI).  Therefore, he is at high risk for perioperative complications.   His functional capacity is good at 6.36 METs according to the [place] Activity Status Index (DASI). According to ACC/AHA guidelines, no further cardiovascular testing needed.   The patient may proceed to surgery at ACCEPTABLE risk.  In review of the patient's medication reconciliation, it is noted that he is on daily oral antithrombotic therapy. Given that patient's past medical history is significant for cardiovascular diagnoses, including but not limited to CAD, .general surgery has cleared patient to continue his daily low dose ASA throughout his perioperative course.  Patient has been updated on these directives from his specialty care providers by the PAT team.  Patient denies previous perioperative complications with anesthesia in the past. In review his EMR, it is noted that patient underwent a general anesthetic course at [facility] (ASA III) in 05/2022 without documented complications.   MOST RECENT VITAL SIGNS:    09/23/2023    2:33 PM 12/31/2022   11:31 AM 06/19/2022    9:57 AM  Vitals with BMI  Height 5' 6 5' 7 5' 8  Weight 256 lbs 6 oz 254 lbs 10 oz 264 lbs 13 oz  BMI 41.4 39.87 40.27  Systolic 136 135 871  Diastolic 60 67 63  Pulse 50 47 52   PROVIDERS/SPECIALISTS: NOTE: Primary physician provider listed below. Patient may have been seen by APP or partner within same practice.   PROVIDER ROLE / SPECIALTY LAST SHERLEAN Stewart Lucas, Stewart General Surgery (Surgeon) 06/29/2024  [name], Stewart Primary Care Provider 12/02/2023  Darron Grass, Stewart Cardiology 09/23/2023; preop APP call 07/09/2024   ALLERGIES: No Known Allergies  CURRENT HOME MEDICATIONS: No current facility-administered medications for this encounter.    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 10/2021 NSTEMI/PCI: LM nl, LAD min irregs, LCX 66m (2.5x18 Onyx Frontier DES), OM1 nl, RCA 62m (3.5x26 Onyx Frontier DES), 30d.   Diastolic dysfunction    a. 10/2021 Echo: EF 50-55%, no rwma, Gr1 DD, nl RV fxn. No significant valvular dzs.   Exposure to Agent Orange    History of heart artery stent 10/20/2021   a.) TOTAL # stents as of 07/14/2023: 2   Hyperlipidemia    Hypertension    Hypothyroidism    Implantable loop recorder present 05/23/2022   Long-term use of aspirin  therapy    Morbid obesity (HCC)    NSTEMI (non-ST elevated myocardial infarction) (HCC) 10/20/2021   a.) troponin peaked at 4253 ng/L; b.) LHC/PCI 10/22/2021: 95% p-mRCA (3.5 x 26 mm Onyx Frontier DES), 30% dRCA, 99% mLCx (2.5 x 18 mm Onyx Frontier DES)   Osteoarthritis    Pre-diabetes    SCC (squamous cell carcinoma) 05/16/2022   left dorsal forearm, EDC   Sleep apnea    a.) unable to toelrate nocturnal PAP therapy   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 SCCis. MOHs 07/08/22   Umbilical hernia    Past Surgical History:  Procedure Laterality Date   COLONOSCOPY N/A 10/09/2015   Procedure: COLONOSCOPY;  Surgeon: Deward CINDERELLA [place], Stewart;  Location: [facility] ENDOSCOPY;  Service: Gastroenterology;  Laterality: N/A;   CORONARY STENT INTERVENTION N/A 10/22/2021   Procedure: CORONARY STENT INTERVENTION;  Surgeon: Darron Deatrice LABOR, Stewart;  Location: [facility] INVASIVE CV LAB;  Service: Cardiovascular;  Laterality: N/A;   IR CT HEAD LTD  05/21/2022   IR PERCUTANEOUS ART THROMBECTOMY/INFUSION INTRACRANIAL INC DIAG ANGIO  05/21/2022   LEFT HEART CATH AND CORONARY ANGIOGRAPHY N/A 10/22/2021   Procedure: LEFT HEART  CATH AND CORONARY ANGIOGRAPHY;  Surgeon: Darron Deatrice LABOR, Stewart;  Location: [facility] INVASIVE CV LAB;  Service: Cardiovascular;  Laterality: N/A;   LOOP RECORDER INSERTION N/A 05/23/2022   Procedure: LOOP RECORDER INSERTION;  Surgeon: Cindie Ole DASEN, Stewart;  Location: [facility] INVASIVE CV LAB;  Service: Cardiovascular;  Laterality: N/A;   RADIOLOGY WITH ANESTHESIA N/A 05/20/2022   Procedure: IR WITH ANESTHESIA;  Surgeon: Dolphus Carrion, Stewart;  Location: [facility] OR;  Service: Radiology;  Laterality: N/A;   ROTATOR CUFF REPAIR     Family History  Problem Relation Age of Onset   Cancer Sister    Stroke Neg Hx    Social History   Tobacco Use   Smoking status: Former    Current packs/day: 0.00    Average packs/day: 1.5 packs/day for 10.0 years (15.0 ttl pk-yrs)    Types: Cigarettes    Start date: 07/19/1980    Quit date: 07/19/1990    Years since quitting: 34.0   Smokeless tobacco: Former  Substance Use Topics   Alcohol  use: Not Currently   LABS:  Component Ref Range & Units 06/15/2024  WBC (White Blood Cell Count) 4.1 - 10.2 10^3/uL 5.8  RBC (Red Blood Cell Count) 4.69 - 6.13 10^6/uL 4.88  Hemoglobin  14.1 - 18.1 gm/dL 85.3  Hematocrit 59.9 - 52.0 % 43.8  MCV (Mean Corpuscular Volume) 80.0 - 100.0 fl 89.8  MCH (Mean Corpuscular Hemoglobin) 27.0 - 31.2 pg 29.9  MCHC (Mean Corpuscular Hgb Concentration) 32.0 - 36.0 gm/dL 66.6  Platelet Count 849 - 450 10^3/uL 201  RDW-CV (Red Cell Distribution Width) 11.6 - 14.8 % 13.0  MPV (Mean Platelet Volume) 9.4 - 12.4 fl 10.6  Neutrophils 1.50 - 7.80 10^3/uL 3.15  Lymphocytes 1.00 - 3.60 10^3/uL 1.98  Monocytes 0.00 - 1.50 10^3/uL 0.48  Eosinophils 0.00 - 0.55 10^3/uL 0.16  Basophils 0.00 - 0.09 10^3/uL 0.05  Neutrophil % 32.0 - 70.0 % 54.0  Lymphocyte % 10.0 - 50.0 % 34.0  Monocyte % 4.0 - 13.0 % 8.2  Eosinophil % 1.0 - 5.0 % 2.7  Basophil% 0.0 - 2.0 % 0.9  Immature Granulocyte % <=0.7 % 0.2  Immature Granulocyte Count <=0.06  10^3/L 0.01   Component Ref Range & Units 4 wk ago  Glucose 70 - 110 mg/dL 894  Sodium 863 - 854 mmol/L 140  Potassium 3.6 - 5.1 mmol/L 4.5  Chloride 97 - 109 mmol/L 107  Carbon Dioxide (CO2) 22.0 - 32.0 mmol/L 30.6  Urea Nitrogen (BUN) 7 - 25 mg/dL 23  Creatinine 0.7 - 1.3 mg/dL 1.2  Glomerular Filtration Rate (eGFR) >60 mL/min/1.73sq m 63  Calcium  8.7 - 10.3 mg/dL 8.9  AST 8 - 39 U/L 19  ALT 6 - 57 U/L 18  Alk Phos (alkaline Phosphatase) 34 - 104 U/L 114 High   Albumin 3.5 - 4.8 g/dL 3.8  Bilirubin, Total 0.3 - 1.2 mg/dL 0.6  Protein, Total 6.1 - 7.9 g/dL 6.1  A/G Ratio 1.0 - 5.0 gm/dL 1.7   Component Ref Range & Units 06/15/2024  Hemoglobin A1C 4.2 - 5.6 % 6.3 High   Average Blood Glucose (Calc) mg/dL 865    ECG: Date: 88/74/7974  Time ECG obtained: 1054 AM Rate: 54 bpm Rhythm: sinus bradycardia Axis (leads I and aVF): normal Intervals: PR 168 ms. QRS 82 ms. QTc 379 ms. ST segment and T wave changes: No evidence of acute T wave abnormalities or significant ST segment elevation or depression.  Evidence of a possible, age undetermined, prior infarct:  Yes; anterior Comparison: Similar to previous tracing obtained on 09/23/2023   IMAGING / PROCEDURES: TRANSTHORACIC ECHOCARDIOGRAM performed on 05/21/2022 Left ventricular ejection fraction, by estimation, is 60 to 65%. The left ventricle has normal function. The left ventricle has no regional wall motion abnormalities. Left ventricular diastolic parameters are consistent with Grade I diastolic dysfunction (impaired relaxation).  Right ventricular systolic function is normal. The right ventricular size is normal. Tricuspid regurgitation signal is inadequate for assessing PA pressure.  The mitral valve is normal in structure. No evidence of mitral valve regurgitation. No evidence of mitral stenosis.  The aortic valve is tricuspid. Aortic valve regurgitation is not visualized. No aortic stenosis is present.   The inferior vena cava is normal in size with greater than 50% respiratory variability, suggesting right atrial pressure of 3 mmHg.   LEFT HEART CATHETERIZATION AND CORONARY ANGIOGRAPHY performed on 10/22/2021 Left ventricular ejection fraction, by estimation, is 60 to 65%. Multivessel CAD Prox RCA to Mid RCA lesion is 95% stenosed. Dist RCA lesion is 30% stenosed Mid Cx lesion is 99% stenosed. Successful PCI DES was successfully placed  to the p-mRCA using a STENT ONYX FRONTIER 3.5X26. DES was successfully placed to the mLCx using a STENT ONYX FRONTIER 2.5X18.  MR ANGIO HEAD / MR BRAIN WO CONTRAST performed on 05/22/2022 Cortical ischemia throughout the left MCA territory compatible with recent infarct. Relatively little affect on the white matter.  Small focus of acute hemorrhage along the anterior surface of the left temporal lobe Heidelberg classification 3c: Subarachnoid hemorrhage. Normal intracranial MRA.   Restored flow in left MCA.  IMPRESSION AND PLAN: Alan Stewart has been referred for pre-anesthesia review and clearance prior to him undergoing the planned anesthetic and procedural courses. Available labs, pertinent testing, and imaging results were personally reviewed by me in preparation for upcoming operative/procedural course. [facility] medical record has been updated following extensive record review and patient interview with PAT staff.   This patient has been appropriately cleared by cardiology with an overall HIGH/ACCEPTABLE risk of patient experiencing significant perioperative cardiovascular complications. here at [facility]. Based on clinical review performed today (07/13/24), barring any significant acute changes in the patient's overall condition, it is anticipated that he will be able to proceed with the planned surgical intervention. Any acute changes in clinical condition may necessitate his procedure being postponed and/or  cancelled. Patient will meet with anesthesia team (Stewart and/or CRNA) on the day of his procedure for preoperative evaluation/assessment. Questions regarding anesthetic course will be fielded at that time.   Pre-surgical instructions were reviewed with the patient during his PAT appointment, and questions were fielded to satisfaction by PAT clinical staff. He has been instructed on which medications that he will need to hold prior to surgery, as well as the ones that have been deemed safe/appropriate to take on the day of his procedure. As part of the general education provided by PAT, patient made aware both verbally and in writing, that he would need to abstain from the use of any illegal substances during his perioperative course. He was advised that failure to follow the provided instructions could necessitate case cancellation or result in serious perioperative complications up to and including death. Patient encouraged to contact PAT and/or his surgeon's office to discuss any questions or concerns that may arise prior to surgery; verbalized understanding.   Dorise Pereyra, MSN, APRN, FNP-C, CEN [facility]  Perioperative Services Nurse Practitioner Phone: [phone] Fax: [phone] 07/13/24 11:00 AM  NOTE: This note has been prepared using Dragon dictation software. Despite my best ability to proofread, there is always the potential that unintentional transcriptional errors may still occur from this process.

## 2024-07-15 ENCOUNTER — Ambulatory Visit: Attending: Cardiology

## 2024-07-15 DIAGNOSIS — I63412 Cerebral infarction due to embolism of left middle cerebral artery: Secondary | ICD-10-CM | POA: Diagnosis not present

## 2024-07-16 LAB — CUP PACEART REMOTE DEVICE CHECK
Date Time Interrogation Session: 20251128092215
Implantable Pulse Generator Implant Date: 20231005
Pulse Gen Model: 450218
Pulse Gen Serial Number: 95054077

## 2024-07-19 ENCOUNTER — Ambulatory Visit
Admission: RE | Admit: 2024-07-19 | Discharge: 2024-07-19 | Disposition: A | Discharge status: Home / Self Care | Attending: General Surgery | Admitting: General Surgery

## 2024-07-19 ENCOUNTER — Other Ambulatory Visit: Payer: Self-pay

## 2024-07-19 ENCOUNTER — Ambulatory Visit: Payer: Self-pay | Admitting: Urgent Care

## 2024-07-19 ENCOUNTER — Encounter
Admission: RE | Disposition: A | Discharge status: Home / Self Care | Payer: Self-pay | Source: Home / Self Care | Attending: General Surgery

## 2024-07-19 ENCOUNTER — Ambulatory Visit: Payer: Self-pay | Admitting: Cardiology

## 2024-07-19 ENCOUNTER — Encounter: Payer: Self-pay | Admitting: General Surgery

## 2024-07-19 DIAGNOSIS — I252 Old myocardial infarction: Secondary | ICD-10-CM | POA: Insufficient documentation

## 2024-07-19 DIAGNOSIS — G4733 Obstructive sleep apnea (adult) (pediatric): Secondary | ICD-10-CM | POA: Diagnosis not present

## 2024-07-19 DIAGNOSIS — Z87891 Personal history of nicotine dependence: Secondary | ICD-10-CM | POA: Diagnosis not present

## 2024-07-19 DIAGNOSIS — I700 Atherosclerosis of aorta: Secondary | ICD-10-CM | POA: Diagnosis not present

## 2024-07-19 DIAGNOSIS — I10 Essential (primary) hypertension: Secondary | ICD-10-CM | POA: Diagnosis not present

## 2024-07-19 DIAGNOSIS — E1165 Type 2 diabetes mellitus with hyperglycemia: Secondary | ICD-10-CM

## 2024-07-19 DIAGNOSIS — Z7739 Contact with and (suspected) exposure to other war theater: Secondary | ICD-10-CM | POA: Diagnosis not present

## 2024-07-19 DIAGNOSIS — E039 Hypothyroidism, unspecified: Secondary | ICD-10-CM | POA: Insufficient documentation

## 2024-07-19 DIAGNOSIS — I2510 Atherosclerotic heart disease of native coronary artery without angina pectoris: Secondary | ICD-10-CM | POA: Insufficient documentation

## 2024-07-19 DIAGNOSIS — Z955 Presence of coronary angioplasty implant and graft: Secondary | ICD-10-CM | POA: Diagnosis not present

## 2024-07-19 DIAGNOSIS — G4730 Sleep apnea, unspecified: Secondary | ICD-10-CM | POA: Diagnosis not present

## 2024-07-19 DIAGNOSIS — Z8673 Personal history of transient ischemic attack (TIA), and cerebral infarction without residual deficits: Secondary | ICD-10-CM | POA: Insufficient documentation

## 2024-07-19 DIAGNOSIS — Z7982 Long term (current) use of aspirin: Secondary | ICD-10-CM | POA: Insufficient documentation

## 2024-07-19 DIAGNOSIS — R7303 Prediabetes: Secondary | ICD-10-CM | POA: Insufficient documentation

## 2024-07-19 DIAGNOSIS — E785 Hyperlipidemia, unspecified: Secondary | ICD-10-CM | POA: Diagnosis not present

## 2024-07-19 DIAGNOSIS — K420 Umbilical hernia with obstruction, without gangrene: Secondary | ICD-10-CM | POA: Insufficient documentation

## 2024-07-19 DIAGNOSIS — Z79899 Other long term (current) drug therapy: Secondary | ICD-10-CM | POA: Diagnosis not present

## 2024-07-19 DIAGNOSIS — K429 Umbilical hernia without obstruction or gangrene: Secondary | ICD-10-CM | POA: Diagnosis not present

## 2024-07-19 HISTORY — DX: Contact with and (suspected) exposure to other war theater: Z77.39

## 2024-07-19 HISTORY — DX: Umbilical hernia without obstruction or gangrene: K42.9

## 2024-07-19 HISTORY — PX: HERNIA REPAIR: SHX51

## 2024-07-19 HISTORY — DX: Long term (current) use of aspirin: Z79.82

## 2024-07-19 HISTORY — DX: Disorder of arteries and arterioles, unspecified: I77.9

## 2024-07-19 HISTORY — DX: Atherosclerosis of aorta: I70.0

## 2024-07-19 SURGERY — REPAIR, HERNIA, UMBILICAL, ROBOT-ASSISTED
Anesthesia: General | Site: Abdomen

## 2024-07-19 MED ORDER — SUGAMMADEX SODIUM 200 MG/2ML IV SOLN
INTRAVENOUS | Status: DC | PRN
  Administered 2024-07-19 (×2): 100 mg via INTRAVENOUS

## 2024-07-19 MED ORDER — DEXAMETHASONE SOD PHOSPHATE PF 10 MG/ML IJ SOLN
INTRAMUSCULAR | Status: DC | PRN
  Administered 2024-07-19: 10 mg via INTRAVENOUS

## 2024-07-19 MED ORDER — LACTATED RINGERS IV SOLN: INTRAVENOUS | Status: DC | PRN

## 2024-07-19 MED ORDER — OXYCODONE HCL 5 MG/5ML PO SOLN: 5.0000 mg | Freq: Once | ORAL | Status: AC | PRN

## 2024-07-19 MED ORDER — OXYCODONE HCL 5 MG PO TABS
5.0000 mg | ORAL_TABLET | Freq: Once | ORAL | Status: AC | PRN
  Administered 2024-07-19: 5 mg via ORAL

## 2024-07-19 MED ORDER — ROCURONIUM BROMIDE 100 MG/10ML IV SOLN
INTRAVENOUS | Status: DC | PRN
  Administered 2024-07-19 (×2): 20 mg via INTRAVENOUS
  Administered 2024-07-19: 50 mg via INTRAVENOUS

## 2024-07-19 MED ORDER — 0.9 % SODIUM CHLORIDE (POUR BTL) OPTIME
TOPICAL | Status: DC | PRN
  Administered 2024-07-19: 1000 mL

## 2024-07-19 MED ORDER — LIDOCAINE HCL (PF) 2 % IJ SOLN
INTRAMUSCULAR | Status: AC
  Filled 2024-07-19: qty 5

## 2024-07-19 MED ORDER — HYDROMORPHONE HCL 1 MG/ML IJ SOLN
INTRAMUSCULAR | Status: AC
  Filled 2024-07-19: qty 1

## 2024-07-19 MED ORDER — PROPOFOL 10 MG/ML IV BOLUS
INTRAVENOUS | Status: DC | PRN
  Administered 2024-07-19: 50 mg via INTRAVENOUS
  Administered 2024-07-19: 150 mg via INTRAVENOUS

## 2024-07-19 MED ORDER — ACETAMINOPHEN 10 MG/ML IV SOLN
INTRAVENOUS | Status: AC
  Filled 2024-07-19: qty 100

## 2024-07-19 MED ORDER — CEFAZOLIN SODIUM-DEXTROSE 2-4 GM/100ML-% IV SOLN: 2.0000 g | INTRAVENOUS | Status: DC

## 2024-07-19 MED ORDER — HYDROCODONE-ACETAMINOPHEN 5-325 MG PO TABS
1.0000 | ORAL_TABLET | Freq: Four times a day (QID) | ORAL | 0 refills | Status: AC | PRN
  Filled 2024-07-19: qty 12, 3d supply, fill #0

## 2024-07-19 MED ORDER — PHENYLEPHRINE HCL-NACL 20-0.9 MG/250ML-% IV SOLN
INTRAVENOUS | Status: AC
  Filled 2024-07-19: qty 250

## 2024-07-19 MED ORDER — FENTANYL CITRATE (PF) 100 MCG/2ML IJ SOLN
INTRAMUSCULAR | Status: AC
  Filled 2024-07-19: qty 2

## 2024-07-19 MED ORDER — PHENYLEPHRINE HCL-NACL 20-0.9 MG/250ML-% IV SOLN
INTRAVENOUS | Status: DC | PRN
  Administered 2024-07-19: 10 ug/min via INTRAVENOUS

## 2024-07-19 MED ORDER — DEXTROSE 5 % IV SOLN
INTRAVENOUS | Status: DC | PRN
  Administered 2024-07-19: 3 g via INTRAVENOUS

## 2024-07-19 MED ORDER — CEFAZOLIN SODIUM-DEXTROSE 2-4 GM/100ML-% IV SOLN
INTRAVENOUS | Status: AC
  Filled 2024-07-19: qty 100

## 2024-07-19 MED ORDER — FENTANYL CITRATE (PF) 100 MCG/2ML IJ SOLN: 25.0000 ug | INTRAMUSCULAR | Status: DC | PRN

## 2024-07-19 MED ORDER — BUPIVACAINE-EPINEPHRINE (PF) 0.25% -1:200000 IJ SOLN
INTRAMUSCULAR | Status: DC | PRN
  Administered 2024-07-19: 30 mL

## 2024-07-19 MED ORDER — HYDROMORPHONE HCL 1 MG/ML IJ SOLN
INTRAMUSCULAR | Status: DC | PRN
  Administered 2024-07-19: 1 mg via INTRAVENOUS

## 2024-07-19 MED ORDER — CHLORHEXIDINE GLUCONATE 0.12 % MT SOLN
15.0000 mL | Freq: Once | OROMUCOSAL | Status: AC
  Administered 2024-07-19: 15 mL via OROMUCOSAL

## 2024-07-19 MED ORDER — BUPIVACAINE-EPINEPHRINE (PF) 0.25% -1:200000 IJ SOLN
INTRAMUSCULAR | Status: AC
  Filled 2024-07-19: qty 30

## 2024-07-19 MED ORDER — ONDANSETRON HCL 4 MG/2ML IJ SOLN
INTRAMUSCULAR | Status: DC | PRN
  Administered 2024-07-19: 4 mg via INTRAVENOUS

## 2024-07-19 MED ORDER — PROPOFOL 10 MG/ML IV BOLUS
INTRAVENOUS | Status: AC
  Filled 2024-07-19: qty 20

## 2024-07-19 MED ORDER — SODIUM CHLORIDE 0.9 % IV SOLN: INTRAVENOUS | Status: DC

## 2024-07-19 MED ORDER — CHLORHEXIDINE GLUCONATE 0.12 % MT SOLN
OROMUCOSAL | Status: AC
  Filled 2024-07-19: qty 15

## 2024-07-19 MED ORDER — OXYCODONE HCL 5 MG PO TABS
ORAL_TABLET | ORAL | Status: AC
  Filled 2024-07-19: qty 1

## 2024-07-19 MED ORDER — METOPROLOL TARTRATE 5 MG/5ML IV SOLN
INTRAVENOUS | Status: DC | PRN
  Administered 2024-07-19 (×2): 1 mg via INTRAVENOUS

## 2024-07-19 MED ORDER — DROPERIDOL 2.5 MG/ML IJ SOLN: 0.6250 mg | Freq: Once | INTRAMUSCULAR | Status: DC | PRN

## 2024-07-19 MED ORDER — FENTANYL CITRATE (PF) 100 MCG/2ML IJ SOLN
INTRAMUSCULAR | Status: DC | PRN
  Administered 2024-07-19: 100 ug via INTRAVENOUS

## 2024-07-19 MED ORDER — ACETAMINOPHEN 10 MG/ML IV SOLN
INTRAVENOUS | Status: DC | PRN
  Administered 2024-07-19: 1000 mg via INTRAVENOUS

## 2024-07-19 MED ORDER — LIDOCAINE HCL (CARDIAC) PF 100 MG/5ML IV SOSY
PREFILLED_SYRINGE | INTRAVENOUS | Status: DC | PRN
  Administered 2024-07-19: 100 mg via INTRAVENOUS

## 2024-07-19 MED ORDER — ROCURONIUM BROMIDE 10 MG/ML (PF) SYRINGE
PREFILLED_SYRINGE | INTRAVENOUS | Status: AC
  Filled 2024-07-19: qty 10

## 2024-07-19 MED ORDER — ACETAMINOPHEN 10 MG/ML IV SOLN: 1000.0000 mg | Freq: Once | INTRAVENOUS | Status: DC | PRN

## 2024-07-19 MED ORDER — ONDANSETRON HCL 4 MG/2ML IJ SOLN
INTRAMUSCULAR | Status: AC
  Filled 2024-07-19: qty 2

## 2024-07-19 MED ORDER — ORAL CARE MOUTH RINSE: 15.0000 mL | Freq: Once | OROMUCOSAL | Status: AC

## 2024-07-19 SURGICAL SUPPLY — 36 items
BAG PRESSURE INF REUSE 1000 (BAG) IMPLANT
COVER TIP SHEARS 8 DVNC (MISCELLANEOUS) ×1 IMPLANT
COVER WAND RF STERILE (DRAPES) ×1 IMPLANT
DEFOGGER SCOPE WARM SEASHARP (MISCELLANEOUS) ×1 IMPLANT
DERMABOND ADVANCED .7 DNX12 (GAUZE/BANDAGES/DRESSINGS) ×1 IMPLANT
DRAPE ARM DVNC X/XI (DISPOSABLE) ×3 IMPLANT
DRAPE COLUMN DVNC XI (DISPOSABLE) ×1 IMPLANT
ELECTRODE REM PT RTRN 9FT ADLT (ELECTROSURGICAL) ×1 IMPLANT
FORCEPS BPLR FENES DVNC XI (FORCEP) ×1 IMPLANT
GLOVE BIO SURGEON STRL SZ 6.5 (GLOVE) ×2 IMPLANT
GLOVE BIOGEL PI IND STRL 6.5 (GLOVE) ×2 IMPLANT
GLOVE SURG SYN 6.5 PF PI (GLOVE) ×2 IMPLANT
GOWN STRL REUS W/ TWL LRG LVL3 (GOWN DISPOSABLE) ×4 IMPLANT
GRASPER SUT TROCAR 14GX15 (MISCELLANEOUS) IMPLANT
IRRIGATOR SUCT 8 DISP DVNC XI (IRRIGATION / IRRIGATOR) IMPLANT
IV 0.9% NACL 1000 ML (IV SOLUTION) IMPLANT
IV CATH ANGIO 12GX3 LT BLUE (NEEDLE) IMPLANT
KIT PINK PAD W/HEAD ARM REST (MISCELLANEOUS) ×1 IMPLANT
LABEL OR SOLS (LABEL) ×1 IMPLANT
MANIFOLD NEPTUNE II (INSTRUMENTS) ×1 IMPLANT
MESH VENTRALIGHT ST 6IN CRC (Mesh General) IMPLANT
NDL DRIVE SUT CUT DVNC (INSTRUMENTS) ×1 IMPLANT
NDL HYPO 22X1.5 SAFETY MO (MISCELLANEOUS) ×1 IMPLANT
NDL INSUFFLATION 14GA 120MM (NEEDLE) ×1 IMPLANT
NS IRRIG 500ML POUR BTL (IV SOLUTION) ×1 IMPLANT
OBTURATOR OPTICALSTD 8 DVNC (TROCAR) ×1 IMPLANT
PACK LAP CHOLECYSTECTOMY (MISCELLANEOUS) ×1 IMPLANT
SCISSORS MNPLR CVD DVNC XI (INSTRUMENTS) ×1 IMPLANT
SEAL UNIV 5-12 XI (MISCELLANEOUS) ×3 IMPLANT
SET TUBE SMOKE EVAC HIGH FLOW (TUBING) ×1 IMPLANT
SOLN STERILE WATER 500 ML (IV SOLUTION) ×1 IMPLANT
SOLUTION ELECTROSURG ANTI STCK (MISCELLANEOUS) ×1 IMPLANT
SUT STRATA 2-0 30 CT-2 (SUTURE) ×2 IMPLANT
SUT STRATAFIX PDS 30 CT-1 (SUTURE) ×1 IMPLANT
SUT VICRYL 0 UR6 27IN ABS (SUTURE) ×1 IMPLANT
SUTURE MNCRL 4-0 27XMF (SUTURE) ×1 IMPLANT

## 2024-07-19 NOTE — Anesthesia Preprocedure Evaluation (Addendum)
 Anesthesia Evaluation  Patient identified by MRN, date of birth, ID band Patient awake    Reviewed: Allergy & Precautions, H&P , NPO status , Patient's Chart, lab work & pertinent test results  Airway Mallampati: IV  TM Distance: >3 FB Neck ROM: full    Dental  (+) Chipped   Pulmonary sleep apnea (OSAH) , former smoker   Pulmonary exam normal        Cardiovascular hypertension, + CAD, + Past MI (2023) and + Cardiac Stents (2023 x2)  Normal cardiovascular exam  Most recent TTE performed on 05/21/2022 revealed a normal left ventricular systolic function with an EF of 60-65%. There was no significant LVH.  There were no regional wall motion abnormalities. Left ventricular diastolic Doppler parameters consistent with abnormal relaxation (G1DD). Right ventricular size and function normal with a TAPSE measuring 2.1 cm  (normal range >/= 1.6 cm).  TR jet insufficient for assessing PA pressure.  There was no significant valvular regurgitation.  All transvalvular gradients were noted to be normal providing no evidence of hemodynamically significant valvular stenosis. Aorta normal in size with no evidence of ectasia or aneurysmal dilatation.   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 10/2021 NSTEMI/PCI: LM nl, LAD min irregs, LCX 57m               (2.5x18 Onyx Frontier DES), OM1 nl, RCA 13m (3.5x26 Onyx               Frontier DES), 30d. No date: Diastolic dysfunction     Comment:  a. 10/2021 Echo: EF 50-55%, no rwma, Gr1 DD, nl RV fxn.               No significant valvular dzs. No date: Exposure to Agent Orange 10/20/2021: History of heart artery stent     Comment:  a.) TOTAL # stents as of 07/14/2023: 2 No date: Hyperlipidemia No date: Hypertension No date: Hypothyroidism 05/23/2022: Implantable loop recorder present No date: Long-term use of aspirin  therapy No date: Morbid obesity (HCC) 10/20/2021: NSTEMI (non-ST elevated myocardial infarction) [facility])     Comment:  a.) troponin peaked at 4253 ng/L; b.) LHC/PCI               10/22/2021: 95% p-mRCA (3.5 x 26 mm Onyx Frontier DES),               30% dRCA, 99% mLCx (2.5 x 18 mm Onyx Frontier DES) No date: Osteoarthritis No date: Pre-diabetes 05/16/2022: SCC (squamous cell carcinoma)  Comment:  left dorsal forearm, EDC No date: Sleep apnea     Comment:  a.) unable to toelrate nocturnal PAP therapy 06/04/2022: Squamous cell carcinoma in situ of skin     Comment:  R-2 Proximal finger. SCCis. MOHs 07/08/22 No date: Umbilical hernia  Past Surgical History: 10/09/2015: COLONOSCOPY; N/A     Comment:  Procedure: COLONOSCOPY;  Surgeon: Deward CINDERELLA Piedmont, MD;                Location: [facility] ENDOSCOPY;  Service: Gastroenterology;                Laterality: N/A; 10/22/2021: CORONARY STENT INTERVENTION; N/A     Comment:  Procedure: CORONARY STENT INTERVENTION;  Surgeon: Darron Deatrice LABOR, MD;  Location: [facility] INVASIVE CV LAB;                Service: Cardiovascular;  Laterality: N/A; 05/21/2022: IR CT HEAD LTD 05/21/2022: IR PERCUTANEOUS ART THROMBECTOMY/INFUSION  INTRACRANIAL INC  DIAG ANGIO 10/22/2021: LEFT HEART CATH AND CORONARY ANGIOGRAPHY; N/A     Comment:  Procedure: LEFT HEART CATH AND CORONARY ANGIOGRAPHY;                Surgeon: Darron Deatrice LABOR, MD;  Location: [facility] INVASIVE               CV LAB;  Service: Cardiovascular;  Laterality: N/A; 05/23/2022: LOOP RECORDER INSERTION; N/A     Comment:  Procedure: LOOP RECORDER INSERTION;  Surgeon: Cindie Ole DASEN, MD;  Location: [facility] INVASIVE CV LAB;  Service:               Cardiovascular;  Laterality: N/A; 05/20/2022: RADIOLOGY WITH ANESTHESIA; N/A     Comment:  Procedure: IR WITH ANESTHESIA;  Surgeon: Dolphus Carrion, MD;  Location: [facility] OR;  Service: Radiology;                Laterality: N/A; No date: ROTATOR CUFF REPAIR     Reproductive/Obstetrics negative OB ROS                              Anesthesia Physical Anesthesia Plan  ASA: 3  Anesthesia Plan: General ETT   Post-op Pain Management: Ofirmev  IV (intra-op)* and Toradol IV (intra-op)*   Induction: Intravenous  PONV Risk Score and Plan: 3 and Ondansetron , Dexamethasone, Midazolam  and Propofol  infusion  Airway Management Planned: Oral ETT  Additional Equipment:   Intra-op Plan:   Post-operative Plan: Extubation in OR  Informed Consent: I have reviewed the patients History and Physical, chart, labs and discussed the procedure including the risks, benefits and alternatives for the proposed anesthesia with the patient or authorized representative who has indicated his/her understanding and acceptance.     Dental Advisory Given  Plan Discussed with: CRNA and Surgeon  Anesthesia Plan Comments:          Anesthesia Quick Evaluation

## 2024-07-19 NOTE — Anesthesia Procedure Notes (Signed)
 Procedure Name: Intubation Date/Time: 07/19/2024 3:55 PM  Performed by: Norleen Alberta HERO., CRNAPre-anesthesia Checklist: Patient identified, Patient being monitored, Timeout performed, Emergency Drugs available and Suction available Patient Re-evaluated:Patient Re-evaluated prior to induction Oxygen Delivery Method: Circle system utilized Preoxygenation: Pre-oxygenation with 100% oxygen Induction Type: IV induction Ventilation: Mask ventilation without difficulty Laryngoscope Size: McGrath and 4 Grade View: Grade I Tube type: Oral Tube size: 7.5 mm Number of attempts: 1 Airway Equipment and Method: Stylet Placement Confirmation: ETT inserted through vocal cords under direct vision, positive ETCO2 and breath sounds checked- equal and bilateral Secured at: 22 cm Tube secured with: Tape Dental Injury: Teeth and Oropharynx as per pre-operative assessment

## 2024-07-19 NOTE — Interval H&P Note (Signed)
 History and Physical Interval Note:  07/19/2024 3:07 PM  Alan Stewart  has presented today for surgery, with the diagnosis of K42.9 umbilical hernia w/o obstruction or gangrene.  The various methods of treatment have been discussed with the patient and family. After consideration of risks, benefits and other options for treatment, the patient has consented to  Procedure(s): REPAIR, HERNIA, UMBILICAL, ROBOT-ASSISTED (N/A) as a surgical intervention.  The patient's history has been reviewed, patient examined, no change in status, stable for surgery.  I have reviewed the patient's chart and labs.  Questions were answered to the patient's satisfaction.     Lucas Sjogren

## 2024-07-19 NOTE — Op Note (Signed)
 Preoperative diagnosis: Umbilical Hernia  Postoperative diagnosis: Umbilical Hernia  Procedure: Robotic assisted laparoscopic Umbilical hernia repair with mesh  Anesthesia: General  Surgeon: Dr. Rodolph  Wound Classification: Clean  Specimen: None  Complications: None  Estimated Blood Loss: 10ml  Indications: Patient is a 78 y.o. male developed a ventral hernia. This was symptomatic and incarcerated and repair was indicated.   Findings: 4 cm infraumbilical hernia 2. Repair achieved with closure of the anterior fascia at midline and 15 cm round Ventralight mesh 3. Adequate hemostasis         Description of procedure: The patient was brought to the operating room and general anesthesia was induced. A time-out was completed verifying correct patient, procedure, site, positioning, and implant(s) and/or special equipment prior to beginning this procedure. Antibiotics were administered prior to making the incision. SCDs placed. The anterior abdominal wall was prepped and draped in the standard sterile fashion.   Palmer's point chosen for entry.  Veress needle placed and abdomen insufflated to 15 mmHg without any dramatic increase in pressure.  Needle removed and optiview technique used to place 8 mm port at same point.  No injury noted during placement. Two additional ports, 8mm x2 along left lateral aspect placed.  12 mm port was exchanged to the Left upper quadrant port. Xi robot then docked into place.  Hernia contents with more than half of patient's omentum incarcerated in the hernia. This was reduced with combination of blunt, sharp dissection with scissors and fenestrated forceps.  Hemostasis achieved throughout this portion.  Once all hernia contents reduced, there was noted to be a 4 cm hernia.    Insufflation dropped to 8 mmHg and transfacial suture with 0 stratafix used to primarily close defect under minimal tension. Bard Ventraligh protected 15 cm mesh was placed  within the abdominal cavity and secured to the abdominal wall centered over the defect using the 0 stratafix previously used to primarily close defect.  The mesh was then circumferentially sutured into the anterior abdominal wall using 2-0 Stratafix.  Any bleeding noted during this portion was no longer actively bleeding by end of securing mesh and tightening the suture.    Robot was undocked.  The 12mm cannula was removed and port site was closed using PMI device and 0 vicryl suture, ensuring no bowels were injured during this process.  Abdomen then desufflated while camera within abdomen to ensure no signs of new bleed prior to removing camera and rest of ports completely.  All skin incisions closed with runninrg 4-0 Monocryl in a subcuticular fashion.  All wounds then dressed with Dermabond.  Patient was then successfully awakened and transferred to PACU in stable condition.  At the end of the procedure sponge and instrument counts were correct.

## 2024-07-19 NOTE — Transfer of Care (Signed)
 Immediate Anesthesia Transfer of Care Note  Patient: Alan Stewart  Procedure(s) Performed: REPAIR, HERNIA, UMBILICAL, ROBOT-ASSISTED (Abdomen)  Patient Location: PACU  Anesthesia Type:General  Level of Consciousness: awake  Airway & Oxygen Therapy: Patient Spontanous Breathing and Patient connected to face mask oxygen  Post-op Assessment: Report given to RN and Post -op Vital signs reviewed and stable  Post vital signs: stable  Last Vitals:  Vitals Value Taken Time  BP 157/93 07/19/24 17:26  Temp 36.7 C 07/19/24 17:26  Pulse 63 07/19/24 17:29  Resp 19 07/19/24 17:30  SpO2 100 % 07/19/24 17:29  Vitals shown include unfiled device data.  Last Pain:  Vitals:   07/19/24 1726  TempSrc:   PainSc: 0-No pain         Complications: No notable events documented.

## 2024-07-19 NOTE — Discharge Instructions (Signed)

## 2024-07-20 NOTE — Anesthesia Postprocedure Evaluation (Signed)
 Anesthesia Post Note  Patient: Clearnce Leja Osentoski  Procedure(s) Performed: REPAIR, HERNIA, UMBILICAL, ROBOT-ASSISTED (Abdomen)  Patient location during evaluation: PACU Anesthesia Type: General Level of consciousness: awake and alert Pain management: pain level controlled Vital Signs Assessment: post-procedure vital signs reviewed and stable Respiratory status: spontaneous breathing, nonlabored ventilation and respiratory function stable Cardiovascular status: blood pressure returned to baseline and stable Postop Assessment: no apparent nausea or vomiting Anesthetic complications: no   There were no known notable events for this encounter.   Last Vitals:  Vitals:   07/19/24 1745 07/19/24 1800  BP: (!) 153/86 (!) 153/84  Pulse: 65 63  Resp: 20 19  Temp:  36.8 C  SpO2: 96% 96%    Last Pain:  Vitals:   07/19/24 1753  TempSrc:   PainSc: 4                  Camellia Merilee Louder

## 2024-07-20 NOTE — Progress Notes (Signed)
 Remote Loop Recorder Transmission

## 2024-07-21 ENCOUNTER — Ambulatory Visit: Admitting: Dermatology

## 2024-07-25 ENCOUNTER — Emergency Department

## 2024-07-25 ENCOUNTER — Other Ambulatory Visit: Payer: Self-pay

## 2024-07-25 ENCOUNTER — Emergency Department
Admission: EM | Admit: 2024-07-25 | Discharge: 2024-07-25 | Disposition: A | Discharge status: Home / Self Care | Attending: Emergency Medicine | Admitting: Emergency Medicine

## 2024-07-25 DIAGNOSIS — R630 Anorexia: Secondary | ICD-10-CM | POA: Insufficient documentation

## 2024-07-25 DIAGNOSIS — K429 Umbilical hernia without obstruction or gangrene: Secondary | ICD-10-CM | POA: Diagnosis not present

## 2024-07-25 DIAGNOSIS — R058 Other specified cough: Secondary | ICD-10-CM | POA: Diagnosis not present

## 2024-07-25 DIAGNOSIS — R9431 Abnormal electrocardiogram [ECG] [EKG]: Secondary | ICD-10-CM | POA: Diagnosis not present

## 2024-07-25 DIAGNOSIS — N400 Enlarged prostate without lower urinary tract symptoms: Secondary | ICD-10-CM | POA: Diagnosis not present

## 2024-07-25 DIAGNOSIS — R1032 Left lower quadrant pain: Secondary | ICD-10-CM | POA: Diagnosis present

## 2024-07-25 DIAGNOSIS — K5730 Diverticulosis of large intestine without perforation or abscess without bleeding: Secondary | ICD-10-CM | POA: Insufficient documentation

## 2024-07-25 DIAGNOSIS — I252 Old myocardial infarction: Secondary | ICD-10-CM | POA: Diagnosis not present

## 2024-07-25 DIAGNOSIS — E119 Type 2 diabetes mellitus without complications: Secondary | ICD-10-CM | POA: Diagnosis not present

## 2024-07-25 DIAGNOSIS — I10 Essential (primary) hypertension: Secondary | ICD-10-CM | POA: Diagnosis not present

## 2024-07-25 DIAGNOSIS — I69351 Hemiplegia and hemiparesis following cerebral infarction affecting right dominant side: Secondary | ICD-10-CM | POA: Diagnosis not present

## 2024-07-25 DIAGNOSIS — Z8673 Personal history of transient ischemic attack (TIA), and cerebral infarction without residual deficits: Secondary | ICD-10-CM | POA: Insufficient documentation

## 2024-07-25 DIAGNOSIS — R109 Unspecified abdominal pain: Secondary | ICD-10-CM

## 2024-07-25 DIAGNOSIS — I2510 Atherosclerotic heart disease of native coronary artery without angina pectoris: Secondary | ICD-10-CM | POA: Insufficient documentation

## 2024-07-25 LAB — RESP PANEL BY RT-PCR (RSV, FLU A&B, COVID)  RVPGX2
Influenza A by PCR: NEGATIVE
Influenza B by PCR: NEGATIVE
Resp Syncytial Virus by PCR: NEGATIVE
SARS Coronavirus 2 by RT PCR: NEGATIVE

## 2024-07-25 LAB — COMPREHENSIVE METABOLIC PANEL WITH GFR
ALT: 27 U/L (ref 0–44)
AST: 40 U/L (ref 15–41)
Albumin: 3.4 g/dL — ABNORMAL LOW (ref 3.5–5.0)
Alkaline Phosphatase: 89 U/L (ref 38–126)
Anion gap: 15 (ref 5–15)
BUN: 32 mg/dL — ABNORMAL HIGH (ref 8–23)
CO2: 22 mmol/L (ref 22–32)
Calcium: 9.2 mg/dL (ref 8.9–10.3)
Chloride: 101 mmol/L (ref 98–111)
Creatinine, Ser: 1.1 mg/dL (ref 0.61–1.24)
GFR, Estimated: >60 mL/min (ref >60)
Glucose, Bld: 140 mg/dL — ABNORMAL HIGH (ref 70–99)
Potassium: 3.6 mmol/L (ref 3.5–5.1)
Sodium: 138 mmol/L (ref 135–145)
Total Bilirubin: 1.8 mg/dL — ABNORMAL HIGH (ref 0.0–1.2)
Total Protein: 6.6 g/dL (ref 6.5–8.1)

## 2024-07-25 LAB — TROPONIN T, HIGH SENSITIVITY
Troponin T High Sensitivity: 21 ng/L — ABNORMAL HIGH (ref 0–19)
Troponin T High Sensitivity: <15 ng/L (ref 0–19)

## 2024-07-25 LAB — URINALYSIS, ROUTINE W REFLEX MICROSCOPIC
Bilirubin Urine: NEGATIVE
Glucose, UA: NEGATIVE mg/dL
Hgb urine dipstick: NEGATIVE
Ketones, ur: NEGATIVE mg/dL
Leukocytes,Ua: NEGATIVE
Nitrite: NEGATIVE
Protein, ur: NEGATIVE mg/dL
Specific Gravity, Urine: 1.029 (ref 1.005–1.030)
pH: 5 (ref 5.0–8.0)

## 2024-07-25 LAB — CBC
HCT: 42.5 % (ref 39.0–52.0)
Hemoglobin: 14.4 g/dL (ref 13.0–17.0)
MCH: 29.6 pg (ref 26.0–34.0)
MCHC: 33.9 g/dL (ref 30.0–36.0)
MCV: 87.3 fL (ref 80.0–100.0)
Platelets: 313 K/uL (ref 150–400)
RBC: 4.87 MIL/uL (ref 4.22–5.81)
RDW: 14.1 % (ref 11.5–15.5)
WBC: 8.1 K/uL (ref 4.0–10.5)
nRBC: 0 % (ref 0.0–0.2)

## 2024-07-25 LAB — LIPASE, BLOOD: Lipase: 32 U/L (ref 11–51)

## 2024-07-25 MED ORDER — LACTATED RINGERS IV BOLUS
500.0000 mL | Freq: Once | INTRAVENOUS | Status: AC
  Administered 2024-07-25: 500 mL via INTRAVENOUS

## 2024-07-25 MED ORDER — IOHEXOL 350 MG/ML SOLN
100.0000 mL | Freq: Once | INTRAVENOUS | Status: AC | PRN
  Administered 2024-07-25: 100 mL via INTRAVENOUS

## 2024-07-25 NOTE — ED Triage Notes (Signed)
 Pt to ED with wife for post op problem. Had hernia surgery on 12/1 and since then has not been able to eat anything. Had constipation but had BM yesterday after Ducolax. Pt endorses some pain to LLQ. 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 Pt also has cough with yellow sputum since last week. Denies vomiting. Incisions look okay but has large area of bruising to LLQ toward flank.

## 2024-07-25 NOTE — ED Provider Notes (Signed)
 Shared visit   Patient postop day 6 from hernia repair who presents to the emergency department with decreased p.o. intake and not feeling well.  Has had multiple episodes of diaphoresis.  Patient complains of just not feeling well.  Denies any significant chest pain or shortness of breath.  No fever or chills.  Last bowel movement was yesterday.  No blood in the stool.  Decreased urine output.  History of aphasia from a stroke.  Laceration is clean dry and intact, mild abdominal tenderness to palpation with no rebound or guarding.  No purulent drainage or signs of erythema or warmth.  Plan for lab work, urine and a CT scan abdomen and pelvis.  Will give a bolus of IV fluids.   Suzanne Kirsch, MD 07/25/24 [id number]

## 2024-07-25 NOTE — Discharge Instructions (Signed)
 Return to the emergency department if you experience nausea, vomiting, fever, or increase in pain.  Follow-up with your surgeon.  Call tomorrow for an appointment.

## 2024-07-25 NOTE — ED Provider Notes (Signed)
 [facility] Provider Note    Event Date/Time   First MD Initiated Contact with Patient 07/25/24 1606     (approximate)   History   Post-op Problem   HPI  Alan Stewart is a 78 y.o. male with history of NSTEMI, hypertension, CAD, CVA with right side weakness, type 2 diabetes and as listed in EMR presents to the emergency department for treatment and evaluation after hernia surgery on December 1.  Since that time has not wanted to eat anything. He is experiencing LLQ tenderness. No fever, nausea, or vomiting. He had been constipated, but took a laxative yesterday and had a bowel movement.     Physical Exam    Vitals:   07/25/24 2000 07/25/24 2125  BP: 135/81 128/64  Pulse: 89 87  Resp: 18 18  Temp: 98.1 F (36.7 C) 98.3 F (36.8 C)  SpO2: 96% 96%    General: Awake, no distress.  CV:  Good peripheral perfusion.  Resp:  Normal effort.  Abd:  No distention.  Soft.  Surgical site is well-approximated and without surrounding erythema.  No drainage.  Ecchymosis noted just below the site of one of the surgical incisions.  No extreme tenderness in that area Other:     ED Results / Procedures / Treatments   Labs (all labs ordered are listed, but only abnormal results are displayed)  Labs Reviewed  COMPREHENSIVE METABOLIC PANEL WITH GFR - Abnormal; Notable for the following components:      Result Value   Glucose, Bld 140 (*)    BUN 32 (*)    Albumin 3.4 (*)    Total Bilirubin 1.8 (*)    All other components within normal limits  URINALYSIS, ROUTINE W REFLEX MICROSCOPIC - Abnormal; Notable for the following components:   Color, Urine YELLOW (*)    APPearance HAZY (*)    All other components within normal limits  TROPONIN T, HIGH SENSITIVITY - Abnormal; Notable for the following components:   Troponin T High Sensitivity 21 (*)    All other components within normal limits  RESP PANEL BY RT-PCR (RSV, FLU A&B, COVID)  RVPGX2  LIPASE, BLOOD  CBC   TROPONIN T, HIGH SENSITIVITY     EKG  Not indicated   RADIOLOGY  Image and radiology report reviewed and interpreted by me. Radiology report consistent with the same.  CT of the abdomen and pelvis shows interval ventral hernia repair with mesh with mild omental infiltration subjacent to the umbilicus which may reflect residual edema or inflammatory change and a 6.8 x 6.6 x 4.4 cm loculated collection of fat and high attenuation fluid within the residual hernia sac in the subcutaneous umbilical region, likely representing blood products and necrotic fat.  PROCEDURES:  Critical Care performed: No  Procedures   MEDICATIONS ORDERED IN ED:  Medications  lactated ringers  bolus 500 mL (0 mLs Intravenous Stopped 07/25/24 2040)  iohexol  (OMNIPAQUE ) 350 MG/ML injection 100 mL (100 mLs Intravenous Contrast Given 07/25/24 1909)     IMPRESSION / MDM / ASSESSMENT AND PLAN / ED COURSE   I have reviewed the triage note and vital signs. Vital signs are stable   Differential diagnosis includes, but is not limited to, postoperative abdominal infection, decreased appetite, bowel obstruction, constipation  Patient's presentation is most consistent with acute presentation with potential threat to life or bodily function.  78 year old male presenting to the emergency department for evaluation after having hernia repair on 12/1.  See HPI for further details.  Lab studies are reassuring.  His white count is normal.  Does have a BUN of 32 with a creatinine of 1.1 and a GFR of greater than 60.  IV fluids ordered.  CT of the abdomen pelvis with contrast shows ventral hernia repair with mesh and a loculated fluid collection.  Will discuss with surgery on-call.  Consulted with Dr. Desiderio who was able to review labs and CT scan.  He feels that the loculated collection is likely a normal finding after hernia repair.  No indication for additional surgery, admission or antibiotics.  Results discussed  with the patient and his wife.  He will be discharged home with instruction to schedule a follow up with surgery. ER return precautions discussed as well.    FINAL CLINICAL IMPRESSION(S) / ED DIAGNOSES   Final diagnoses:  Decreased appetite  Abdominal pain, unspecified abdominal location     Rx / DC Orders   ED Discharge Orders     None        Note:  This document was prepared using Dragon voice recognition software and may include unintentional dictation errors.   Herlinda Kirk NOVAK, FNP 07/25/24 7680    Suzanne Kirsch, MD 07/26/24 1231

## 2024-07-27 ENCOUNTER — Telehealth: Payer: Self-pay

## 2024-07-27 NOTE — Telephone Encounter (Signed)
 Call back received from Pt's wife.  She states he is slowly recovering from hernia surgery.  He denies any symptoms related to episode noted last evening.  Wife states she thinks at the time of the episode Pt was finally able to move his bowels after some trouble with this post surgery.  Advised would send to provider for review.  Anticipate continued monitoring.

## 2024-07-27 NOTE — Telephone Encounter (Addendum)
 Alert received for HVR episode July 26, 2024 at 8:17 pm lasting 5 minutes 44 seconds.  Outreach made to Pt.  Call went to VM.  Left message requesting call back.

## 2024-08-12 ENCOUNTER — Encounter

## 2024-08-15 ENCOUNTER — Ambulatory Visit: Attending: Cardiology

## 2024-08-15 DIAGNOSIS — I63412 Cerebral infarction due to embolism of left middle cerebral artery: Secondary | ICD-10-CM | POA: Diagnosis not present

## 2024-08-16 LAB — CUP PACEART REMOTE DEVICE CHECK
Date Time Interrogation Session: 20251229090359
Implantable Pulse Generator Implant Date: 20231005
Pulse Gen Model: 450218
Pulse Gen Serial Number: 95054077

## 2024-08-22 ENCOUNTER — Ambulatory Visit: Payer: Self-pay | Admitting: Cardiology

## 2024-08-23 NOTE — Progress Notes (Signed)
 Remote Loop Recorder Transmission

## 2024-08-31 ENCOUNTER — Ambulatory Visit: Attending: Cardiovascular Disease | Admitting: Cardiovascular Disease

## 2024-08-31 VITALS — BP 122/54 | HR 70 | Ht 66.0 in | Wt 260.4 lb

## 2024-08-31 DIAGNOSIS — I10 Essential (primary) hypertension: Secondary | ICD-10-CM

## 2024-08-31 DIAGNOSIS — E785 Hyperlipidemia, unspecified: Secondary | ICD-10-CM

## 2024-08-31 DIAGNOSIS — I2510 Atherosclerotic heart disease of native coronary artery without angina pectoris: Secondary | ICD-10-CM | POA: Diagnosis not present

## 2024-08-31 NOTE — Patient Instructions (Signed)

## 2024-08-31 NOTE — Progress Notes (Signed)
 "    Cardiology Office Note   Date:  08/31/2024   ID:  Alan Stewart, DOB 14-Nov-1945, MRN [id number]  PCP:  Alan Ophelia JINNY DOUGLAS, MD  Cardiologist:   Alan Cage, MD   Chief Complaint  Patient presents with   Follow-up      History of Present Illness: Alan Stewart is a 79 y.o. male who presents for a follow-up visit regarding coronary artery disease. He has known history of essential hypertension, hyperlipidemia, prediabetes, obesity and obstructive sleep apnea. He presented in March of 2023 with non-ST elevation myocardial infarction.  Cardiac catheterization showed severe mid left circumflex and mid RCA disease.  Both were treated with PCI and drug-eluting stent placement.  EF was 50 to 55%. He was diagnosed with obstructive sleep apnea but did not tolerate CPAP. He was hospitalized in October 2023 with left MCA infarct.  He was treated with TNK.  He had an echocardiogram during that admission that showed normal LV systolic function. He has a loop recorder in place.  There has been no evidence of atrial fibrillation so far. He has been doing well overall with no chest pain, shortness of breath or palpitations.  He had umbilical hernia done in December.  He had poor appetite and weakness after that but he recovered since then.  Past Medical History:  Diagnosis Date   Acute cerebrovascular accident (CVA) due to occlusion of left middle cerebral artery (HCC) 05/20/2022   a.) CTA head/neck 05/20/2022: occlusion of the terminus of the LICA and the entire LEFT MCA with minimal distal collateralization --> s/p IR thrombectomy 05/21/2022   Aortic atherosclerosis    BCC (basal cell carcinoma) 01/20/2024   right mid helix, needs Mohs   Bilateral carotid artery disease    CAD (coronary artery disease)    a. 10/2021 NSTEMI/PCI: LM nl, LAD min irregs, LCX 32m (2.5x18 Onyx Frontier DES), OM1 nl, RCA 31m (3.5x26 Onyx Frontier DES), 30d.   Diastolic dysfunction    a. 10/2021 Echo: EF 50-55%, no  rwma, Gr1 DD, nl RV fxn. No significant valvular dzs.   Exposure to Agent Orange    History of heart artery stent 10/20/2021   a.) TOTAL # stents as of 07/14/2023: 2   Hyperlipidemia    Hypertension    Hypothyroidism    Implantable loop recorder present 05/23/2022   Long-term use of aspirin  therapy    Morbid obesity (HCC)    NSTEMI (non-ST elevated myocardial infarction) (HCC) 10/20/2021   a.) troponin peaked at 4253 ng/L; b.) LHC/PCI 10/22/2021: 95% p-mRCA (3.5 x 26 mm Onyx Frontier DES), 30% dRCA, 99% mLCx (2.5 x 18 mm Onyx Frontier DES)   Osteoarthritis    Pre-diabetes    SCC (squamous cell carcinoma) 05/16/2022   left dorsal forearm, EDC   Sleep apnea    a.) unable to toelrate nocturnal PAP therapy   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 SCCis. MOHs 07/08/22   Umbilical hernia     Past Surgical History:  Procedure Laterality Date   COLONOSCOPY N/A 10/09/2015   Procedure: COLONOSCOPY;  Surgeon: Alan Stewart Piedmont, MD;  Location: [facility] END[facility];  Service: Gastroenterology;  Laterality: N/A;   CORONARY STENT INTERVENTION N/A 10/22/2021   Procedure: CORONARY STENT INTERVENTION;  Surgeon: Stewart Alan LABOR, MD;  Location: [facility] INVASIVE CV LAB;  Service: Cardiovascular;  Laterality: N/A;   HERNIA REPAIR  07/19/2024   IR CT HEAD LTD  05/21/2022   IR PERCUTANEOUS ART THROMBECTOMY/INFUSION  INTRACRANIAL INC DIAG ANGIO  05/21/2022   LEFT HEART CATH AND CORONARY ANGIOGRAPHY N/A 10/22/2021   Procedure: LEFT HEART CATH AND CORONARY ANGIOGRAPHY;  Surgeon: Alan Stewart LABOR, MD;  Location: [facility] INVASIVE CV LAB;  Service: Cardiovascular;  Laterality: N/A;   LOOP RECORDER INSERTION N/A 05/23/2022   Procedure: LOOP RECORDER INSERTION;  Surgeon: Alan Ole DASEN, MD;  Location: [facility] INVASIVE CV LAB;  Service: Cardiovascular;  Laterality: N/A;   RADIOLOGY WITH ANESTHESIA N/A 05/20/2022   Procedure: IR WITH ANESTHESIA;  Surgeon: Alan Carrion, MD;  Location: [facility] OR;   Service: Radiology;  Laterality: N/A;   ROTATOR CUFF REPAIR       Current Outpatient Medications  Medication Sig Dispense Refill   aspirin  EC 81 MG EC tablet Take 1 tablet (81 mg total) by mouth daily. Swallow whole. 30 tablet 11   atorvastatin  (LIPITOR ) 80 MG tablet Take 1 tablet (80 mg total) by mouth daily. 90 tablet 3   ezetimibe  (ZETIA ) 10 MG tablet TAKE 1 TABLET BY MOUTH DAILY 90 tablet 2   levothyroxine  (SYNTHROID ) 75 MCG tablet Take 75 mcg by mouth daily.     losartan  (COZAAR ) 50 MG tablet TAKE 1 TABLET BY MOUTH ONCE DAILY 90 tablet 0   nitroGLYCERIN  (NITROSTAT ) 0.4 MG SL tablet Place 1 tablet (0.4 mg total) under the tongue every 5 (five) minutes as needed for chest pain. 30 tablet 12   No current facility-administered medications for this visit.    Allergies:   Patient has no known allergies.    Social History:  The patient  reports that he quit smoking about 34 years ago. His smoking use included cigarettes. He started smoking about 44 years ago. He has a 15 pack-year smoking history. He has quit using smokeless tobacco. He reports that he does not currently use alcohol . He reports that he does not use drugs.   Family History:  The patient's family history includes Cancer in his sister.    ROS:  Please see the history of present illness.   Otherwise, review of systems are positive for none.   All other systems are reviewed and negative.    PHYSICAL EXAM: VS:  BP (!) 122/54 (BP Location: Left Arm, Patient Position: Sitting)   Pulse 70   Ht 5' 6 (1.676 m)   Wt 260 lb 6.4 oz (118.1 kg)   SpO2 97%   BMI 42.03 kg/m  , BMI Body mass index is 42.03 kg/m. GEN: Well nourished, well developed, in no acute distress  HEENT: normal  Neck: no JVD, carotid bruits, or masses Cardiac: RRR; no murmurs, rubs, or gallops,no edema  Respiratory:  clear to auscultation bilaterally, normal work of breathing GI: soft, nontender, nondistended, + BS MS: no deformity or atrophy  Skin:  warm and dry, no rash Neuro:  Strength and sensation are intact Psych: euthymic mood, full affect   EKG:  EKG ordered today. EKG showed: Normal sinus rhythm Nonspecific T wave abnormality When compared with ECG of 25-Jul-2024 17:03, Nonspecific T wave abnormality, improved in Lateral leads     Recent Labs: 07/25/2024: ALT 27; BUN 32; Creatinine, Ser 1.10; Hemoglobin 14.4; Platelets 313; Potassium 3.6; Sodium 138    Lipid Panel    Component Value Date/Time   CHOL 106 05/21/2022 0433   TRIG 83 05/21/2022 0433   HDL 33 (L) 05/21/2022 0433   CHOLHDL 3.2 05/21/2022 0433   VLDL 17 05/21/2022 0433   LDLCALC 56 05/21/2022 0433      Wt Readings from Last  3 Encounters:  08/31/24 260 lb 6.4 oz (118.1 kg)  07/25/24 267 lb (121.1 kg)  07/19/24 268 lb 1.3 oz (121.6 kg)           No data to display            ASSESSMENT AND PLAN:  1.  Coronary artery disease involving native coronary arteries without angina: He is doing very well with no anginal symptoms.  Continue aspirin  daily.  2.  Essential hypertension: He was switched from carvedilol  to losartan  last year due to bradycardia.  His blood pressure is well-controlled today.  3.  Hyperlipidemia: I reviewed most recent lipid profile done in October which showed an LDL of 60.  Continue high-dose atorvastatin  and ezetimibe .    4.  Obstructive sleep apnea: Intolerant to CPAP.    Disposition:   FU with me in 12 months  Signed,  Alan Cage, MD  08/31/2024 2:25 PM    [facility] "

## 2024-09-15 ENCOUNTER — Ambulatory Visit

## 2024-09-15 DIAGNOSIS — I63412 Cerebral infarction due to embolism of left middle cerebral artery: Secondary | ICD-10-CM

## 2024-09-15 LAB — CUP PACEART REMOTE DEVICE CHECK
Date Time Interrogation Session: 20260128090532
Implantable Pulse Generator Implant Date: 20231005
Pulse Gen Model: 450218
Pulse Gen Serial Number: 95054077

## 2024-09-21 ENCOUNTER — Other Ambulatory Visit: Payer: Self-pay | Admitting: Cardiovascular Disease

## 2024-09-23 NOTE — Progress Notes (Signed)
 Remote Loop Recorder Transmission

## 2024-10-16 ENCOUNTER — Ambulatory Visit

## 2024-11-11 ENCOUNTER — Encounter

## 2024-11-16 ENCOUNTER — Ambulatory Visit

## 2025-02-10 ENCOUNTER — Encounter

## 2025-05-12 ENCOUNTER — Encounter

## 2025-08-15 ENCOUNTER — Encounter

## 2025-11-10 ENCOUNTER — Encounter
